# Patient Record
Sex: Male | Born: 1960 | Race: White | Hispanic: No | Marital: Married | Smoking: Never smoker
Health system: Southern US, Community
[De-identification: ages and names within clinical notes are randomized; demographics above are authoritative.]

## PROBLEM LIST (undated history)

## (undated) DIAGNOSIS — K219 Gastro-esophageal reflux disease without esophagitis: Secondary | ICD-10-CM

## (undated) DIAGNOSIS — K921 Melena: Secondary | ICD-10-CM

## (undated) DIAGNOSIS — E669 Obesity, unspecified: Secondary | ICD-10-CM

## (undated) DIAGNOSIS — R0600 Dyspnea, unspecified: Secondary | ICD-10-CM

## (undated) DIAGNOSIS — R200 Anesthesia of skin: Secondary | ICD-10-CM

## (undated) DIAGNOSIS — A419 Sepsis, unspecified organism: Secondary | ICD-10-CM

## (undated) DIAGNOSIS — G4730 Sleep apnea, unspecified: Secondary | ICD-10-CM

## (undated) DIAGNOSIS — H409 Unspecified glaucoma: Secondary | ICD-10-CM

## (undated) DIAGNOSIS — G4733 Obstructive sleep apnea (adult) (pediatric): Secondary | ICD-10-CM

## (undated) DIAGNOSIS — I10 Essential (primary) hypertension: Secondary | ICD-10-CM

## (undated) DIAGNOSIS — Z7982 Long term (current) use of aspirin: Secondary | ICD-10-CM

## (undated) DIAGNOSIS — F319 Bipolar disorder, unspecified: Secondary | ICD-10-CM

## (undated) DIAGNOSIS — T884XXA Failed or difficult intubation, initial encounter: Secondary | ICD-10-CM

## (undated) DIAGNOSIS — M1711 Unilateral primary osteoarthritis, right knee: Secondary | ICD-10-CM

## (undated) DIAGNOSIS — K269 Duodenal ulcer, unspecified as acute or chronic, without hemorrhage or perforation: Secondary | ICD-10-CM

## (undated) DIAGNOSIS — F32A Depression, unspecified: Secondary | ICD-10-CM

## (undated) DIAGNOSIS — K649 Unspecified hemorrhoids: Secondary | ICD-10-CM

## (undated) DIAGNOSIS — IMO0002 Reserved for concepts with insufficient information to code with codable children: Secondary | ICD-10-CM

## (undated) HISTORY — DX: Essential (primary) hypertension: I10

## (undated) HISTORY — DX: Unspecified glaucoma: H40.9

## (undated) HISTORY — DX: Unspecified hemorrhoids: K64.9

## (undated) HISTORY — DX: Melena: K92.1

## (undated) HISTORY — DX: Reserved for concepts with insufficient information to code with codable children: IMO0002

## (undated) HISTORY — PX: APPENDECTOMY: SHX54

## (undated) HISTORY — DX: Obesity, unspecified: E66.9

---

## 1990-07-05 HISTORY — PX: CHOLECYSTECTOMY: SHX55

## 1998-07-05 DIAGNOSIS — I10 Essential (primary) hypertension: Secondary | ICD-10-CM

## 1998-07-05 HISTORY — DX: Essential (primary) hypertension: I10

## 2002-07-05 DIAGNOSIS — H409 Unspecified glaucoma: Secondary | ICD-10-CM

## 2002-07-05 HISTORY — DX: Unspecified glaucoma: H40.9

## 2005-10-13 ENCOUNTER — Ambulatory Visit: Payer: Self-pay | Admitting: *Deleted

## 2005-11-08 ENCOUNTER — Ambulatory Visit: Payer: Self-pay | Admitting: *Deleted

## 2005-12-15 ENCOUNTER — Ambulatory Visit (HOSPITAL_COMMUNITY): Admission: RE | Admit: 2005-12-15 | Discharge: 2005-12-15 | Payer: Self-pay | Admitting: *Deleted

## 2007-02-15 ENCOUNTER — Ambulatory Visit: Payer: Self-pay | Admitting: Internal Medicine

## 2007-03-03 ENCOUNTER — Ambulatory Visit: Payer: Self-pay | Admitting: Internal Medicine

## 2007-06-14 ENCOUNTER — Ambulatory Visit: Payer: Self-pay | Admitting: Specialist

## 2007-12-12 DIAGNOSIS — G4730 Sleep apnea, unspecified: Secondary | ICD-10-CM | POA: Insufficient documentation

## 2007-12-12 DIAGNOSIS — F4323 Adjustment disorder with mixed anxiety and depressed mood: Secondary | ICD-10-CM | POA: Insufficient documentation

## 2007-12-12 DIAGNOSIS — I10 Essential (primary) hypertension: Secondary | ICD-10-CM | POA: Insufficient documentation

## 2008-04-16 ENCOUNTER — Ambulatory Visit: Payer: Self-pay | Admitting: Family Medicine

## 2008-07-05 HISTORY — PX: KNEE SURGERY: SHX244

## 2008-10-22 DIAGNOSIS — J301 Allergic rhinitis due to pollen: Secondary | ICD-10-CM | POA: Insufficient documentation

## 2009-01-07 ENCOUNTER — Ambulatory Visit: Payer: Self-pay | Admitting: Orthopedic Surgery

## 2009-01-20 ENCOUNTER — Ambulatory Visit: Payer: Self-pay | Admitting: Orthopedic Surgery

## 2009-01-28 DIAGNOSIS — E7800 Pure hypercholesterolemia, unspecified: Secondary | ICD-10-CM | POA: Insufficient documentation

## 2009-03-06 ENCOUNTER — Ambulatory Visit: Payer: Self-pay | Admitting: Orthopedic Surgery

## 2009-03-07 ENCOUNTER — Emergency Department: Payer: Self-pay | Admitting: Emergency Medicine

## 2009-07-05 DIAGNOSIS — K259 Gastric ulcer, unspecified as acute or chronic, without hemorrhage or perforation: Secondary | ICD-10-CM

## 2009-07-05 DIAGNOSIS — IMO0002 Reserved for concepts with insufficient information to code with codable children: Secondary | ICD-10-CM

## 2009-07-05 HISTORY — DX: Gastric ulcer, unspecified as acute or chronic, without hemorrhage or perforation: K25.9

## 2009-07-05 HISTORY — DX: Reserved for concepts with insufficient information to code with codable children: IMO0002

## 2009-08-22 DIAGNOSIS — G4700 Insomnia, unspecified: Secondary | ICD-10-CM | POA: Insufficient documentation

## 2010-05-22 ENCOUNTER — Ambulatory Visit: Payer: Self-pay | Admitting: General Surgery

## 2010-05-22 HISTORY — PX: COLONOSCOPY W/ POLYPECTOMY: SHX1380

## 2010-05-22 LAB — HM COLONOSCOPY

## 2010-11-17 NOTE — Assessment & Plan Note (Signed)
[facility]                             PULMONARY OFFICE NOTE   CORDERO, SURETTE                     MRN:          [id number]  DATE:03/03/2007                            DOB:          20-Apr-1961    HISTORY:  This is a 50 year old white male former smoker who comes in  with his wife concerned that he is still coughing after having been seen  on 8/13 with a cough dating back two months. He states that he was  transiently better after receiving a six day course of prednisone, but  never was able to control the cough. On the other hand, he did not  aggressively suppress the cough with Tramadol and the maximum dose that  was recommended. He did start on Aciphex 20 mg daily before meals on his  visit and eliminated Lotrel in favor of Tekturna 300/25 1 daily.   The patient does give the interesting history that he has no cough at  all when he is sleeping, and actually does not cough for the first hour  and a half. He then begins violent coughing and then after it is out of  control reaches for the Tramadol which then helps some. However, he  denies any excess sputum production, fevers, chills, sweats, orthopnea,  PND, or leg swelling.   PHYSICAL EXAMINATION:  GENERAL:  Obese, ambulatory white male in no  acute distress.  VITAL SIGNS:  Stable. Weight 323 pounds, blood pressure 158/108.  HEENT:  Unremarkable, oropharynx clear, nasal turbinates normal.  LUNGS:  Clear bilaterally to auscultation and percussion. He coughed on  inspiration and felt the urge to cough in the midsternal area. He had  excellent air movement though with no crackles on inspiration nor  wheezes on expiration.  CARDIAC:  Regular rate and rhythm with no murmurs, rubs, or gallops.  ABDOMEN:  Soft and benign, but obese.  EXTREMITIES:  Warm without calf tenderness, cyanosis, clubbing, or  edema.   IMPRESSION:  1. 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 To address this address, I recommended revising the cough      regimen that he was given previously to include an increased dose      of Aciphex to b.i.d. dosing, an increase of Tramadol up to every 4      hours until the cough was gone and then to reduce the dose, and a 6      day course of prednisone.  2. Hypertension that is not adequately controlled on his present      regimen. I would like to avoid calcium channel blockers in patients      who I suspect that might have reflux and Minoxidil might be a good      choice here, however, given the refractory nature of his      hypertension which most likely is related to obesity. However, I do      not have samples of Minoxidil, so I gave him [employer]  17 mg 1 daily      for the next 3 weeks and would defer management of the      hypertension to Dr. Elisabeth Cara capable hands, but I would certainly      avoid ACE inhibitors if at all possible.     Charlaine Dalton. Sherene Sires, MD, [facility]  Electronically Signed    MBW/MedQ  DD: 03/03/2007  DT: 03/05/2007  Job #: [id number]   cc:   Julieanne Manson

## 2010-11-17 NOTE — Assessment & Plan Note (Signed)
[facility]                             PULMONARY OFFICE NOTE   Peter Peter [name], Peter Peter [name]                     MRN:          [id number]  DATE:02/15/2007                            DOB:          Jan 25, 1961    REFERRING PHYSICIAN:  Julieanne Manson   PULMONARY CONSULTATION   REASON FOR CONSULTATION:  Cough.   HISTORY:  A 50 year old white male with morbid obesity status post  smoking cessation 17 years ago with no significant chronic lower  respiratory tract problems, but intermittent seasonal rhinitis with new  onset cough approximately 2 months ago that occurred abruptly.  It  occurred while on ACE inhibitor, but did not improve off of ACE  inhibitor for, he thinks, about 4 weeks.  He described it as a dry cough  that worsens during the day, but does not exacerbate nocturnally.  He  does have seasonal nasal allergy symptoms, as above, but never had a  cough from this and says that these are no worse than usual since the  onset of this cough abruptly 2 months ago.  He denies any overt reflux  or sinus symptoms at present, fevers, chills, sweats, or significant  dyspnea, except during coughing paroxysms.   PAST MEDICAL HISTORY:  Significant for hypertension, obesity, and sleep  apnea.  He is status post cholecystectomy in 1993.   ALLERGIES:  None known.   MEDICATIONS:  Include both Diovan and Lotrel, as well as omega 3  vitamins and atenolol.  For full inventory of medications, please see  face sheet dated February 15, 2007.   SOCIAL HISTORY:  He quit smoking 17 years ago.  He works in  Set designer.   FAMILY HISTORY:  Reviewed in detail with the patient and negative for  respiratory diseases and atopy.   REVIEW OF SYSTEMS:  Taken also in detail and on the worksheet, negative  except as outlined above.   PHYSICAL EXAMINATION:  This is Peter [name] obese pleasant ambulatory white male  in no acute distress.  He has stable vital signs, except for a blood  pressure of 150/94, and a  weight of 324 pounds.  HEENT:  Oropharynx reveals limited airway inspection due to size of the  tongue and low soft palate.  I do not see any obvious post-nasal  drainage or cobblestoning of the airway.  Nasal turbinates revealed mild  nonspecific edema.  Ear canals reveal wax bilaterally, but there is no  cough on manipulation of the external canal.  NECK:  Supple without cervical adenopathy or tenderness.  Trachea is  midline without thyromegaly.  LUNGS:  The lung fields are perfectly clear bilaterally to auscultation  and percussion with no cough elicited on inspiratory or expiratory  maneuvers.  CARDIAC:  Regular rate and rhythm without murmur, gallop, or rub.  S1,  S2 were diminished.  ABDOMEN:  Soft and obese, but benign.  Limited excursion in supine  position.  EXTREMITIES:  Warm without calf tenderness, cyanosis, clubbing, or  edema.   CHEST X-RAY:  Reviewed with no comparisons available.  Study done July  17 with the only abnormality being elevated  right hemidiaphragm  anteriorly, consistent with eventration rather than paralysis.   IMPRESSION:  Chronic daytime cough with nonspecific features that  strongly favor reflux as a driving mechanism.  I have found that reflux  serves to fuel the fire, ACE inhibitors tend to fan the fire, and the  cough tends to exacerbate via a cyclical mechanism in this particular  type of individual.  Therefore, to control the cough it is necessary to  eliminating the cough loop (by the combination of Delsym and tramadol 50  mg every 4 hours, take AcipHex 20 mg before meals perfectly regularly  for at least 4 weeks, and to take a 6 day course of prednisone to  control airways inflammation).   At this point I would like him to stop Lotrel and substitute Tekturna  300/25 to supplement the Diovan.  At the end of 4 weeks, I have asked  him to return to Dr. Sullivan Lone if better, and to return to me if not for  additional  workup, which might include a sinus CT scan and methacholine  using the standardized approach to cough published in Chest within the  last year by Dr. Stark Falls.   I also note parenthetically that he is on timolol, which is a  nonspecific beta blocker, which sometimes can cause asthma, and  therefore cough.  However, the absence of cough during sleep is strongly  against this possibility.   In terms of future treatment of hypertension in this patient with morbid  obesity, I will defer, of course, that issue to Dr. Sullivan Lone.  ACE  inhibitors are problematic in patients who tend to cough because they  frequently exacerbate coughing, even if the couh is present for other  reasons.  I note that he is already on maximum doses of Diovan so  additional ARB therapy, is not likely to be beneficial either.  For now,  I have recommended simply the combination of Diovan, Tekturna, and  hydrochlorothiazide.  The Tekturna should be 300/25 one daily.     Charlaine Dalton. Sherene Sires, MD, [facility]  Electronically Signed    MBW/MedQ  DD: 02/15/2007  DT: 02/16/2007  Job #: [id number]   cc:   Julieanne Manson

## 2011-06-21 DIAGNOSIS — F32A Depression, unspecified: Secondary | ICD-10-CM | POA: Insufficient documentation

## 2011-06-21 DIAGNOSIS — F329 Major depressive disorder, single episode, unspecified: Secondary | ICD-10-CM | POA: Insufficient documentation

## 2013-01-12 ENCOUNTER — Encounter: Payer: Self-pay | Admitting: *Deleted

## 2013-01-15 ENCOUNTER — Encounter: Payer: Self-pay | Admitting: *Deleted

## 2013-01-15 DIAGNOSIS — K921 Melena: Secondary | ICD-10-CM | POA: Insufficient documentation

## 2014-12-13 ENCOUNTER — Other Ambulatory Visit: Payer: Self-pay | Admitting: Family Medicine

## 2015-01-08 ENCOUNTER — Other Ambulatory Visit: Payer: Self-pay | Admitting: Family Medicine

## 2015-01-09 ENCOUNTER — Other Ambulatory Visit: Payer: Self-pay

## 2015-01-09 DIAGNOSIS — K219 Gastro-esophageal reflux disease without esophagitis: Secondary | ICD-10-CM

## 2015-01-09 MED ORDER — MAGNESIUM OXIDE 400 (241.3 MG) MG PO TABS: 1.0000 | ORAL_TABLET | Freq: Two times a day (BID) | ORAL | Status: DC

## 2015-01-09 MED ORDER — RANITIDINE HCL 300 MG PO TABS: 300.0000 mg | ORAL_TABLET | Freq: Two times a day (BID) | ORAL | Status: DC

## 2015-02-07 ENCOUNTER — Other Ambulatory Visit: Payer: Self-pay | Admitting: Family Medicine

## 2015-03-25 ENCOUNTER — Ambulatory Visit (INDEPENDENT_AMBULATORY_CARE_PROVIDER_SITE_OTHER): Payer: BLUE CROSS/BLUE SHIELD | Admitting: Family Medicine

## 2015-03-25 ENCOUNTER — Encounter: Payer: Self-pay | Admitting: Family Medicine

## 2015-03-25 VITALS — BP 128/84 | HR 66 | Temp 98.4°F | Resp 14 | Ht 68.0 in | Wt 317.0 lb

## 2015-03-25 DIAGNOSIS — Z125 Encounter for screening for malignant neoplasm of prostate: Secondary | ICD-10-CM | POA: Diagnosis not present

## 2015-03-25 DIAGNOSIS — E669 Obesity, unspecified: Secondary | ICD-10-CM | POA: Diagnosis not present

## 2015-03-25 DIAGNOSIS — Z23 Encounter for immunization: Secondary | ICD-10-CM | POA: Diagnosis not present

## 2015-03-25 DIAGNOSIS — Z1211 Encounter for screening for malignant neoplasm of colon: Secondary | ICD-10-CM

## 2015-03-25 DIAGNOSIS — E559 Vitamin D deficiency, unspecified: Secondary | ICD-10-CM | POA: Insufficient documentation

## 2015-03-25 DIAGNOSIS — Z0000 Encounter for general adult medical examination without abnormal findings: Secondary | ICD-10-CM

## 2015-03-25 DIAGNOSIS — E291 Testicular hypofunction: Secondary | ICD-10-CM | POA: Insufficient documentation

## 2015-03-25 LAB — POCT URINALYSIS DIPSTICK
Bilirubin, UA: NEGATIVE
Blood, UA: NEGATIVE
Glucose, UA: NEGATIVE
Ketones, UA: NEGATIVE
Leukocytes, UA: NEGATIVE
Nitrite, UA: NEGATIVE
Protein, UA: NEGATIVE
Spec Grav, UA: 1.02
Urobilinogen, UA: 0.2
pH, UA: 7

## 2015-03-25 LAB — IFOBT (OCCULT BLOOD): IMMUNOLOGICAL FECAL OCCULT BLOOD TEST: POSITIVE

## 2015-03-25 NOTE — Progress Notes (Signed)
Patient ID: BLONG BUSK, male   DOB: 1961/04/15, 54 y.o.   MRN: [id number]  Visit Date: 03/25/2015  Today's Provider: [name], MD   Chief Complaint  Patient presents with  . Annual Exam   Subjective:  Peter [name] is a 54 y.o. male who presents today for health maintenance and complete physical. He feels well. He reports exercising none. He reports he is sleeping well. LAST  Colonoscopy 05/22/10-pathology was not received.  EKG 03/09/12  Tdap 04/06/11  Review of Systems  Constitutional: Negative.   HENT: Positive for sinus pressure.   Eyes: Negative.   Respiratory: Negative.   Cardiovascular: Negative.   Gastrointestinal: Negative.   Endocrine: Negative.   Genitourinary: Negative.   Musculoskeletal: Positive for arthralgias.  Skin: Negative.   Allergic/Immunologic: Negative.   Neurological: Positive for headaches.  Hematological: Negative.   Psychiatric/Behavioral: Negative.     Social History   Social History  . Marital Status: Married    Spouse Name: N/A  . Number of Children: N/A  . Years of Education: N/A   Occupational History  . Not on file.   Social History Main Topics  . Smoking status: Never Smoker   . Smokeless tobacco: Never Used  . Alcohol Use: Yes     Comment: occasionally  . Drug Use: No  . Sexual Activity: Not on file   Other Topics Concern  . Not on file   Social History Narrative    Patient Active Problem List   Diagnosis Date Noted  . 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 Avitaminosis D 03/25/2015  . Blood in stool   . Clinical depression 06/21/2011  . Cannot sleep 08/22/2009  . Hypercholesterolemia without hypertriglyceridemia 01/28/2009  . Hay fever 10/22/2008  . Adjustment disorder with mixed anxiety and depressed mood 12/12/2007  . Essential (primary) hypertension 12/12/2007  . Apnea, sleep 12/12/2007    Past Surgical History  Procedure Laterality Date  . Knee surgery Left 2010  . Cholecystectomy  1992  .  Colonoscopy w/ polypectomy  05/22/2010    57mmtransverse colon polyp, traditional serrated adenoma,negative for high grade dysplasia & malignancy. rectal polyp-80mm,negative for dysplasia & malignancy.    His family history includes Cancer in his other; Hyperlipidemia in his mother; Hypertension in his mother.    Outpatient Prescriptions Prior to Visit  Medication Sig Dispense Refill  . BYSTOLIC 10 MG tablet TAKE 1 TABLET BY MOUTH EVERY DAY 30 tablet 5  . magnesium oxide (MAGNESIUM-OXIDE) 400 (241.3 MG) MG tablet Take 1 tablet (400 mg total) by mouth 2 (two) times daily. 60 tablet 12  . ranitidine (ZANTAC) 300 MG tablet Take 1 tablet (300 mg total) by mouth 2 (two) times daily. 60 tablet 12   No facility-administered medications prior to visit.    Patient Care Team: Jerrol Banana., MD as PCP - General (Family Medicine)     Objective:   Vitals:  Filed Vitals:   03/25/15 0846  BP: 128/84  Pulse: 66  Temp: 98.4 F (36.9 C)  Resp: 14  Height: 5\' 8"  (1.727 m)  Weight: 317 lb (143.79 kg)    Physical Exam  Constitutional: He is oriented to person, place, and time. He appears well-developed and well-nourished.  Morbidly obese white male in no acute distress  HENT:  Head: Normocephalic and atraumatic.  Right Ear: External ear normal.  Left Ear: External ear normal.  Nose: Nose normal.  Mouth/Throat: Oropharynx is clear and moist.  Eyes: Conjunctivae are normal. Pupils are equal, round,  and reactive to light.  Neck: Neck supple.  Cardiovascular: Normal rate, regular rhythm, normal heart sounds and intact distal pulses.   Pulmonary/Chest: Effort normal and breath sounds normal.  Abdominal: Soft.  Genitourinary: Rectum normal, prostate normal and penis normal.  Musculoskeletal: Normal range of motion.  Neurological: He is alert and oriented to person, place, and time.  Skin: Skin is warm and dry.  Psychiatric: He has a normal mood and affect. His behavior is normal.  Judgment and thought content normal.     Depression Screen PHQ 2/9 Scores 03/25/2015  PHQ - 2 Score 0      Assessment & Plan:    1. Need for influenza vaccination  - Flu Vaccine QUAD 36+ mos IM  2. Prostate cancer screening  - PSA  3. Colon cancer screening  - IFOBT POC (occult bld, rslt in office)OC positive--refer to Dr. Jennette Banker from GI  4. Annual physical exam  - CBC with Differential/Platelet - Lipid Panel With LDL/HDL Ratio - TSH - Comprehensive metabolic panel - POCT urinalysis dipstick  5. Obesity BMI of 48 with obstructive sleep apnea and hypertension - Ambulatory referral to General Surgery for consideration of bariatric surgery.  6. Atypical nevi of trunk Refer to dermatology    I have done the exam and reviewed the above chart and it is accurate to the best of my knowledge.

## 2015-03-26 LAB — CBC WITH DIFFERENTIAL/PLATELET
BASOS ABS: 0 10*3/uL (ref 0.0–0.2)
Basos: 0 %
EOS (ABSOLUTE): 0.1 10*3/uL (ref 0.0–0.4)
EOS: 2 %
HEMATOCRIT: 45.3 % (ref 37.5–51.0)
HEMOGLOBIN: 15.5 g/dL (ref 12.6–17.7)
IMMATURE GRANS (ABS): 0 10*3/uL (ref 0.0–0.1)
Immature Granulocytes: 0 %
LYMPHS ABS: 1.9 10*3/uL (ref 0.7–3.1)
LYMPHS: 36 %
MCH: 30 pg (ref 26.6–33.0)
MCHC: 34.2 g/dL (ref 31.5–35.7)
MCV: 88 fL (ref 79–97)
MONOCYTES: 10 %
Monocytes Absolute: 0.6 10*3/uL (ref 0.1–0.9)
NEUTROS ABS: 2.8 10*3/uL (ref 1.4–7.0)
Neutrophils: 52 %
Platelets: 217 10*3/uL (ref 150–379)
RBC: 5.16 x10E6/uL (ref 4.14–5.80)
RDW: 13.4 % (ref 12.3–15.4)
WBC: 5.4 10*3/uL (ref 3.4–10.8)

## 2015-03-26 LAB — LIPID PANEL WITH LDL/HDL RATIO
CHOLESTEROL TOTAL: 251 mg/dL — AB (ref 100–199)
HDL: 79 mg/dL (ref >39)
LDL CALC: 144 mg/dL — AB (ref 0–99)
LDL/HDL RATIO: 1.8 ratio (ref 0.0–3.6)
TRIGLYCERIDES: 138 mg/dL (ref 0–149)
VLDL CHOLESTEROL CAL: 28 mg/dL (ref 5–40)

## 2015-03-26 LAB — COMPREHENSIVE METABOLIC PANEL
A/G RATIO: 1.3 (ref 1.1–2.5)
ALK PHOS: 123 IU/L — AB (ref 39–117)
ALT: 15 IU/L (ref 0–44)
AST: 22 IU/L (ref 0–40)
Albumin: 4 g/dL (ref 3.5–5.5)
BILIRUBIN TOTAL: 0.4 mg/dL (ref 0.0–1.2)
BUN / CREAT RATIO: 22 — AB (ref 9–20)
BUN: 17 mg/dL (ref 6–24)
CHLORIDE: 101 mmol/L (ref 97–108)
CO2: 28 mmol/L (ref 18–29)
Calcium: 9.3 mg/dL (ref 8.7–10.2)
Creatinine, Ser: 0.78 mg/dL (ref 0.76–1.27)
GFR calc non Af Amer: 101 mL/min/{1.73_m2} (ref >59)
GFR, EST AFRICAN AMERICAN: 117 mL/min/{1.73_m2} (ref >59)
Globulin, Total: 3.2 g/dL (ref 1.5–4.5)
Glucose: 91 mg/dL (ref 65–99)
POTASSIUM: 3.6 mmol/L (ref 3.5–5.2)
SODIUM: 144 mmol/L (ref 134–144)
TOTAL PROTEIN: 7.2 g/dL (ref 6.0–8.5)

## 2015-03-26 LAB — TSH: TSH: 3.55 u[IU]/mL (ref 0.450–4.500)

## 2015-03-26 LAB — PSA: PROSTATE SPECIFIC AG, SERUM: 1.5 ng/mL (ref 0.0–4.0)

## 2015-04-10 ENCOUNTER — Other Ambulatory Visit: Payer: Self-pay | Admitting: Family Medicine

## 2015-05-09 ENCOUNTER — Other Ambulatory Visit: Payer: Self-pay | Admitting: Family Medicine

## 2015-06-16 ENCOUNTER — Ambulatory Visit: Payer: Self-pay | Admitting: General Surgery

## 2015-07-31 ENCOUNTER — Encounter: Payer: Self-pay | Admitting: *Deleted

## 2015-08-09 ENCOUNTER — Other Ambulatory Visit: Payer: Self-pay | Admitting: Family Medicine

## 2015-08-19 ENCOUNTER — Ambulatory Visit
Admission: RE | Admit: 2015-08-19 | Discharge: 2015-08-19 | Disposition: A | Discharge status: Home / Self Care | Payer: BLUE CROSS/BLUE SHIELD | Source: Ambulatory Visit | Attending: Family Medicine | Admitting: Family Medicine

## 2015-08-19 ENCOUNTER — Ambulatory Visit (INDEPENDENT_AMBULATORY_CARE_PROVIDER_SITE_OTHER): Payer: BLUE CROSS/BLUE SHIELD | Admitting: Family Medicine

## 2015-08-19 VITALS — BP 148/88 | HR 70 | Temp 98.3°F | Resp 16 | Wt 310.0 lb

## 2015-08-19 DIAGNOSIS — R059 Cough, unspecified: Secondary | ICD-10-CM

## 2015-08-19 DIAGNOSIS — R6889 Other general symptoms and signs: Secondary | ICD-10-CM | POA: Diagnosis not present

## 2015-08-19 DIAGNOSIS — J42 Unspecified chronic bronchitis: Secondary | ICD-10-CM

## 2015-08-19 DIAGNOSIS — R05 Cough: Secondary | ICD-10-CM

## 2015-08-19 LAB — POCT INFLUENZA A/B
Influenza A, POC: NEGATIVE
Influenza B, POC: NEGATIVE

## 2015-08-19 MED ORDER — AMOXICILLIN-POT CLAVULANATE 875-125 MG PO TABS: 1.0000 | ORAL_TABLET | Freq: Two times a day (BID) | ORAL | Status: DC

## 2015-08-19 NOTE — Progress Notes (Signed)
Patient ID: Peter [name], male   DOB: October 10, 1960, 55 y.o.   MRN: [id number]   CASHON MITRI  MRN: [id number] DOB: June 28, 1961  Subjective:  HPI   1. Flu-like symptoms The patient is a 55 year old male who presents for evaluation of flu like symptoms.  He states they began about 1 week ago with scratchy throat and then turned into cough with occasionally being productive of brownish sputum.  No fever that he notes.  He does complain of body aches and head congestion.  He further states that several people at work are out with similar symptoms, but he is unsure if any have tested positive for flu.  It is of note that the patient has had his flu shot for this year.  Patient Active Problem List   Diagnosis Date Noted  . 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 Avitaminosis D 03/25/2015  . Blood in stool   . Clinical depression 06/21/2011  . Cannot sleep 08/22/2009  . Hypercholesterolemia without hypertriglyceridemia 01/28/2009  . Hay fever 10/22/2008  . Adjustment disorder with mixed anxiety and depressed mood 12/12/2007  . Essential (primary) hypertension 12/12/2007  . Apnea, sleep 12/12/2007    Past Medical History  Diagnosis Date  . Unspecified hemorrhoids without mention of complication   . Glaucoma     since 2004  . Hypertension     since 2000  . Ulcer 2011    gastric  . Blood in stool   . Obesity, unspecified     Social History   Social History  . Marital Status: Married    Spouse Name: N/A  . Number of Children: N/A  . Years of Education: N/A   Occupational History  . Not on file.   Social History Main Topics  . Smoking status: Never Smoker   . Smokeless tobacco: Never Used  . Alcohol Use: Yes     Comment: occasionally  . Drug Use: No  . Sexual Activity: Not on file   Other Topics Concern  . Not on file   Social History Narrative    Outpatient Prescriptions Prior to Visit  Medication Sig Dispense Refill  . amLODipine (NORVASC) 10 MG tablet TAKE 1  TABLET BY MOUTH EVERY DAY 30 tablet 12  . ascorbic acid (C 500) 500 MG tablet C 500, 500MG  (Oral Tablet)  1 every day for 0 days  Quantity: 0.00;  Refills: 0   Ordered :27-Feb-2010  Ashley Royalty ;  Started 07-Aug-2009 Active    . BYSTOLIC 10 MG tablet TAKE 1 TABLET BY MOUTH EVERY DAY 30 tablet 0  . clobetasol ointment (TEMOVATE) 0.05 %     . EQUETRO 200 MG CP12 12 hr capsule   7  . Garlic 123XX123 MG TABS Take 1 tablet by mouth 2 (two) times daily.    . hydrochlorothiazide (HYDRODIURIL) 25 MG tablet TAKE 1 TABLET BY MOUTH EVERY DAY 30 tablet 12  . lamoTRIgine (LAMICTAL) 200 MG tablet Take by mouth.    . losartan (COZAAR) 100 MG tablet TAKE 1 TABLET BY MOUTH EVERY DAY 30 tablet 12  . Magnesium Oxide 400 MG CAPS Take by mouth.    . OMEGA-3 FATTY ACIDS PO     . potassium chloride (MICRO-K) 10 MEQ CR capsule Take by mouth.    . ranitidine (ZANTAC) 300 MG tablet Take 1 tablet (300 mg total) by mouth 2 (two) times daily. 60 tablet 12  . RUTIN, MISC. NUTRITIONAL FACTORS, RUTIN, 500MG  (Oral Tablet)  1 every day for 0  days  Quantity: 0.00;  Refills: 0   Ordered :27-Feb-2010  Ashley Royalty ;  Started 07-Aug-2009 Active    . sertraline (ZOLOFT) 100 MG tablet TAKE 1 TABLET BY MOUTH TWICE DAILY 60 tablet 12  . magnesium oxide (MAGNESIUM-OXIDE) 400 (241.3 MG) MG tablet Take 1 tablet (400 mg total) by mouth 2 (two) times daily. 60 tablet 12   No facility-administered medications prior to visit.    No Known Allergies  Review of Systems  Constitutional: Positive for malaise/fatigue. Negative for fever, chills and diaphoresis.  HENT: Positive for congestion. Negative for ear discharge, ear pain, hearing loss, nosebleeds, sore throat and tinnitus.   Eyes: Negative for blurred vision, double vision, photophobia, pain, discharge and redness.  Respiratory: Positive for cough, sputum production and shortness of breath (mainly from coughing so much). Negative for hemoptysis and wheezing.        Rib pain  secondary to cough.  Cardiovascular: Negative for chest pain, palpitations, orthopnea and leg swelling.  Neurological: Positive for weakness. Negative for headaches.   Objective:  BP 148/88 mmHg  Pulse 70  Temp(Src) 98.3 F (36.8 C) (Oral)  Resp 16  Wt 310 lb (140.615 kg)  SpO2 95%  Physical Exam  Constitutional: He is oriented to person, place, and time and well-developed, well-nourished, and in no distress.  HENT:  Head: Normocephalic and atraumatic.  Right Ear: External ear normal.  Left Ear: External ear normal.  Nose: Nose normal.  Mouth/Throat: Oropharynx is clear and moist.  Eyes: Conjunctivae and EOM are normal. Pupils are equal, round, and reactive to light.  Neck: Normal range of motion. Neck supple.  Cardiovascular: Normal rate, regular rhythm, normal heart sounds and intact distal pulses.   Pulmonary/Chest: Effort normal and breath sounds normal.  Neurological: He is alert and oriented to person, place, and time. Gait normal.  Skin: Skin is warm and dry.  Psychiatric: Mood, memory, affect and judgment normal.    Assessment and Plan :   1. Flu-like symptoms  - POCT Influenza A/B  2. Chronic bronchitis, unspecified chronic bronchitis type ([place])  - DG Chest 2 View; Future - amoxicillin-clavulanate (AUGMENTIN) 875-125 MG tablet; Take 1 tablet by mouth 2 (two) times daily.  Dispense: 20 tablet; Refill: 0  3. Cough  - DG Chest 2 View; Future - amoxicillin-clavulanate (AUGMENTIN) 875-125 MG tablet; Take 1 tablet by mouth 2 (two) times daily.  Dispense: 20 tablet; Refill: 0 4.Obesity/OSA  I have done the exam and reviewed the above chart and it is accurate to the best of my knowledge.   Miguel Aschoff MD [facility] 08/19/2015 9:34 AM

## 2015-09-07 ENCOUNTER — Other Ambulatory Visit: Payer: Self-pay | Admitting: Family Medicine

## 2015-09-22 ENCOUNTER — Ambulatory Visit: Payer: BLUE CROSS/BLUE SHIELD | Admitting: Family Medicine

## 2015-10-09 DIAGNOSIS — S62620A Displaced fracture of medial phalanx of right index finger, initial encounter for closed fracture: Secondary | ICD-10-CM | POA: Diagnosis not present

## 2015-10-09 DIAGNOSIS — S20212A Contusion of left front wall of thorax, initial encounter: Secondary | ICD-10-CM | POA: Diagnosis not present

## 2015-10-10 ENCOUNTER — Other Ambulatory Visit: Payer: Self-pay | Admitting: Family Medicine

## 2015-10-13 DIAGNOSIS — F3175 Bipolar disorder, in partial remission, most recent episode depressed: Secondary | ICD-10-CM | POA: Diagnosis not present

## 2015-10-13 LAB — HEPATIC FUNCTION PANEL
ALK PHOS: 130 U/L — AB (ref 25–125)
ALT: 18 U/L (ref 10–40)
AST: 20 U/L (ref 14–40)
Bilirubin, Total: 0.4 mg/dL

## 2015-10-13 LAB — CBC AND DIFFERENTIAL
HEMATOCRIT: 45 % (ref 41–53)
Hemoglobin: 15.6 g/dL (ref 13.5–17.5)
NEUTROS ABS: 4 /uL
PLATELETS: 228 10*3/uL (ref 150–399)
WBC: 6.6 10^3/mL

## 2015-10-20 ENCOUNTER — Encounter: Payer: Self-pay | Admitting: Family Medicine

## 2015-10-24 DIAGNOSIS — F3175 Bipolar disorder, in partial remission, most recent episode depressed: Secondary | ICD-10-CM | POA: Diagnosis not present

## 2015-12-23 ENCOUNTER — Encounter: Payer: Self-pay | Admitting: Family Medicine

## 2015-12-23 ENCOUNTER — Ambulatory Visit (INDEPENDENT_AMBULATORY_CARE_PROVIDER_SITE_OTHER): Payer: BLUE CROSS/BLUE SHIELD | Admitting: Family Medicine

## 2015-12-23 VITALS — BP 140/78 | HR 72 | Temp 99.3°F | Resp 16 | Ht 68.0 in | Wt 314.0 lb

## 2015-12-23 DIAGNOSIS — J0100 Acute maxillary sinusitis, unspecified: Secondary | ICD-10-CM

## 2015-12-23 MED ORDER — FLUTICASONE PROPIONATE 50 MCG/ACT NA SUSP: 2.0000 | Freq: Every day | NASAL | Status: DC

## 2015-12-23 MED ORDER — AMOXICILLIN 875 MG PO TABS: 875.0000 mg | ORAL_TABLET | Freq: Two times a day (BID) | ORAL | Status: DC

## 2015-12-23 NOTE — Patient Instructions (Signed)

## 2015-12-23 NOTE — Progress Notes (Signed)
Patient ID: Peter [name], male   DOB: [birth year], 55 y.o.   MRN: [id number]       Patient: Peter [name] Male    DOB: 1961/01/03   55 y.o.   MRN: [id number] Visit Date: 12/23/2015  Today's Provider: Vernie Murders, PA   Chief Complaint  Patient presents with  . URI    X 4 days.    Subjective:    URI  This is a new problem. The current episode started in the past 7 days. The problem has been gradually worsening. There has been no fever. Associated symptoms include congestion, headaches and sinus pain. Pertinent negatives include no coughing or plugged ear sensation.   Patient reports that he has head congestion over the last 4 days. He reports that he has been taking Advil cold and sinus with no relief. Had sore throat at onset with cough. More head pressure and stuffiness with rhinorrhea. No sneezing or documented fever but felt some chills.    Past Medical History  Diagnosis Date  . Unspecified hemorrhoids without mention of complication   . Glaucoma     since 2004  . Hypertension     since 2000  . Ulcer 2011    gastric  . Blood in stool   . Obesity, unspecified    Past Surgical History  Procedure Laterality Date  . Knee surgery Left 2010  . Cholecystectomy  1992  . Colonoscopy w/ polypectomy  05/22/2010    76mmtransverse colon polyp, traditional serrated adenoma,negative for high grade dysplasia & malignancy. rectal polyp-45mm,negative for dysplasia & malignancy.   Family History  Problem Relation Age of Onset  . Cancer Other     colon  . Hyperlipidemia Mother   . Hypertension Mother    No Known Allergies Current Meds  Medication Sig  . amLODipine (NORVASC) 10 MG tablet TAKE 1 TABLET BY MOUTH EVERY DAY  . ascorbic acid (C 500) 500 MG tablet C 500, 500MG  (Oral Tablet)  1 every day for 0 days  Quantity: 0.00;  Refills: 0   Ordered :27-Feb-2010  Ashley Royalty ;  Started 07-Aug-2009 Active  . BYSTOLIC 10 MG tablet TAKE 1 TABLET BY MOUTH EVERY DAY  .  clobetasol ointment (TEMOVATE) 0.05 %   . EQUETRO 200 MG CP12 12 hr capsule   . Garlic 123XX123 MG TABS Take 1 tablet by mouth 2 (two) times daily.  . hydrochlorothiazide (HYDRODIURIL) 25 MG tablet TAKE 1 TABLET BY MOUTH EVERY DAY  . lamoTRIgine (LAMICTAL) 200 MG tablet Take by mouth.  . losartan (COZAAR) 100 MG tablet TAKE 1 TABLET BY MOUTH EVERY DAY  . Magnesium Oxide 400 MG CAPS Take by mouth.  . OMEGA-3 FATTY ACIDS PO   . potassium chloride (MICRO-K) 10 MEQ CR capsule TAKE 4 CAPSULES BY MOUTH EVERY DAY  . ranitidine (ZANTAC) 300 MG tablet Take 1 tablet (300 mg total) by mouth 2 (two) times daily.  Marland [employer] RUTIN, MISC. NUTRITIONAL FACTORS, RUTIN, 500MG  (Oral Tablet)  1 every day for 0 days  Quantity: 0.00;  Refills: 0   Ordered :27-Feb-2010  Ashley Royalty ;  Started 07-Aug-2009 Active  . sertraline (ZOLOFT) 100 MG tablet TAKE 1 TABLET BY MOUTH TWICE DAILY    Review of Systems  HENT: Positive for congestion.   Respiratory: Negative for cough.   Neurological: Positive for headaches.    Social History  Substance Use Topics  . Smoking status: Never Smoker   . Smokeless tobacco: Never Used  . Alcohol Use: Yes  Comment: occasionally   Objective:   BP 140/78 mmHg  Pulse 72  Temp(Src) 99.3 F (37.4 C)  Resp 16  Ht 5\' 8"  (1.727 m)  Wt 314 lb (142.429 kg)  BMI 47.75 kg/m2  SpO2 95%  Physical Exam  Constitutional: He is oriented to person, place, and time. He appears well-developed and well-nourished. No distress.  HENT:  Head: Normocephalic and atraumatic.  Right Ear: Hearing and external ear normal.  Left Ear: Hearing and external ear normal.  Nose: Nose normal.  Slightly cobblestone appearance to posterior pharynx. Tender maxillary sinuses with no transillumination. Nasal membranes pink and slightly swollen.  Eyes: Conjunctivae and lids are normal. Right eye exhibits no discharge. Left eye exhibits no discharge. No scleral icterus.  Neck: Neck supple.  Cardiovascular: Normal  rate and regular rhythm.   Pulmonary/Chest: Effort normal and breath sounds normal. No respiratory distress.  Musculoskeletal: Normal range of motion.  Lymphadenopathy:    He has no cervical adenopathy.  Neurological: He is alert and oriented to person, place, and time.  Skin: Skin is intact. No lesion and no rash noted.  Psychiatric: He has a normal mood and affect. His speech is normal and behavior is normal. Thought content normal.      Assessment & Plan:     1. Acute maxillary sinusitis, recurrence not specified Onset with PND and sore throat with sinus pressure over the past week. Will treat with antibiotic, steroid nasal spray and may continue Advil Cold & Sinus. Increase fluid intake and recheck if no better in 7-10 days. - amoxicillin (AMOXIL) 875 MG tablet; Take 1 tablet (875 mg total) by mouth 2 (two) times daily.  Dispense: 20 tablet; Refill: 0 - fluticasone (FLONASE) 50 MCG/ACT nasal spray; Place 2 sprays into both nostrils daily.  Dispense: 16 g; Refill: [place], PA  [facility]

## 2016-01-11 ENCOUNTER — Other Ambulatory Visit: Payer: Self-pay | Admitting: Family Medicine

## 2016-04-18 ENCOUNTER — Encounter: Payer: Self-pay | Admitting: Emergency Medicine

## 2016-04-18 ENCOUNTER — Emergency Department
Admission: EM | Admit: 2016-04-18 | Discharge: 2016-04-18 | Disposition: A | Discharge status: Home / Self Care | Payer: Managed Care, Other (non HMO) | Attending: Emergency Medicine | Admitting: Emergency Medicine

## 2016-04-18 DIAGNOSIS — S51812A Laceration without foreign body of left forearm, initial encounter: Secondary | ICD-10-CM | POA: Diagnosis not present

## 2016-04-18 DIAGNOSIS — Y999 Unspecified external cause status: Secondary | ICD-10-CM | POA: Insufficient documentation

## 2016-04-18 DIAGNOSIS — S61213A Laceration without foreign body of left middle finger without damage to nail, initial encounter: Secondary | ICD-10-CM | POA: Insufficient documentation

## 2016-04-18 DIAGNOSIS — W01118A Fall on same level from slipping, tripping and stumbling with subsequent striking against other sharp object, initial encounter: Secondary | ICD-10-CM | POA: Diagnosis not present

## 2016-04-18 DIAGNOSIS — I10 Essential (primary) hypertension: Secondary | ICD-10-CM | POA: Diagnosis not present

## 2016-04-18 DIAGNOSIS — Z79899 Other long term (current) drug therapy: Secondary | ICD-10-CM | POA: Diagnosis not present

## 2016-04-18 DIAGNOSIS — Y92009 Unspecified place in unspecified non-institutional (private) residence as the place of occurrence of the external cause: Secondary | ICD-10-CM | POA: Diagnosis not present

## 2016-04-18 DIAGNOSIS — Z23 Encounter for immunization: Secondary | ICD-10-CM | POA: Insufficient documentation

## 2016-04-18 DIAGNOSIS — Y9389 Activity, other specified: Secondary | ICD-10-CM | POA: Insufficient documentation

## 2016-04-18 DIAGNOSIS — W19XXXA Unspecified fall, initial encounter: Secondary | ICD-10-CM

## 2016-04-18 MED ORDER — BACITRACIN-NEOMYCIN-POLYMYXIN 400-5-5000 EX OINT
TOPICAL_OINTMENT | Freq: Once | CUTANEOUS | Status: AC
  Administered 2016-04-18: 1 via TOPICAL
  Filled 2016-04-18: qty 1

## 2016-04-18 MED ORDER — TETANUS-DIPHTH-ACELL PERTUSSIS 5-2.5-18.5 LF-MCG/0.5 IM SUSP
0.5000 mL | Freq: Once | INTRAMUSCULAR | Status: AC
  Administered 2016-04-18: 0.5 mL via INTRAMUSCULAR
  Filled 2016-04-18: qty 0.5

## 2016-04-18 MED ORDER — LIDOCAINE-EPINEPHRINE 1 %-1:200000 IJ SOLN
INTRAMUSCULAR | Status: AC
Start: 2016-04-18 — End: 2016-04-18
  Administered 2016-04-18: 30 mL
  Filled 2016-04-18: qty 30

## 2016-04-18 MED ORDER — LIDOCAINE-EPINEPHRINE 1 %-1:100000 IJ SOLN
30.0000 mL | Freq: Once | INTRAMUSCULAR | Status: DC
  Filled 2016-04-18: qty 30

## 2016-04-18 NOTE — ED Provider Notes (Signed)
[facility] Emergency Department Provider Note  ____________________________________________  Time seen: Approximately 7:58 PM  I have reviewed the triage vital signs and the nursing notes.   HISTORY  Chief Complaint Laceration    HPI Peter [name] is a 55 y.o. male right-handed male presenting with left forearm and left middle finger laceration after falling. The patient reports that this afternoon he tripped over his computer bag, and fell down onto his left arm. He is not anticoagulated. He did not have any loss of consciousness and denies any neck or back pain. He has no numbness or tingling or limitations in movement.   Past Medical History:  Diagnosis Date  . Blood in stool   . Glaucoma    since 2004  . Hypertension    since 2000  . Obesity, unspecified   . Ulcer ([place]) 2011   gastric  . Unspecified hemorrhoids without mention of complication     Patient Active Problem List   Diagnosis Date Noted  . 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 Avitaminosis D 03/25/2015  . Blood in stool   . Clinical depression 06/21/2011  . Cannot sleep 08/22/2009  . Hypercholesterolemia without hypertriglyceridemia 01/28/2009  . Hay fever 10/22/2008  . Adjustment disorder with mixed anxiety and depressed mood 12/12/2007  . Essential (primary) hypertension 12/12/2007  . Apnea, sleep 12/12/2007    Past Surgical History:  Procedure Laterality Date  . CHOLECYSTECTOMY  1992  . COLONOSCOPY W/ POLYPECTOMY  05/22/2010   68mmtransverse colon polyp, traditional serrated adenoma,negative for high grade dysplasia & malignancy. rectal polyp-70mm,negative for dysplasia & malignancy.  Marland Kitchen KNEE SURGERY Left 2010    Current Outpatient Rx  . Order #: [id number] Class: Normal  . Order #: [id number] Class: Historical Med  . Order #: [id number] Class: Normal  . Order #: [id number] Class: Historical Med  . Order #: [id number] Class: Historical Med  . Order #: [id number] Class:  Historical Med  . Order #: [id number] Class: Normal  . Order #: [id number] Class: Normal  . Order #: [id number] Class: Normal  . Order #: [id number] Class: Historical Med  . Order #: [id number] Class: Normal  . Order #: [id number] Class: Normal  . Order #: [id number] Class: Historical Med  . Order #: [id number] Class: Normal  . Order #: [id number] Class: Normal  . Order #: [id number] Class: Normal  . Order #: [id number] Class: Historical Med  . Order #: [id number] Class: Historical Med    Allergies Review of patient's allergies indicates no known allergies.  Family History  Problem Relation Age of Onset  . Cancer Other     colon  . Hyperlipidemia Mother   . Hypertension Mother     Social History Social History  Substance Use Topics  . Smoking status: Never Smoker  . Smokeless tobacco: Never Used  . Alcohol use Yes     Comment: occasionally    Review of Systems Constitutional: No fever/chills.No lightheadedness or syncope. Positive fall. No loss of consciousness. Eyes: No visual changes. No blurred or double vision. ENT:  No congestion or rhinorrhea. Cardiovascular: Denies chest pain. Denies palpitations. Respiratory: Denies shortness of breath.  No cough. Gastrointestinal: No nausea, no vomiting.  No diarrhea.  No constipation. Musculoskeletal: Negative for back pain. Negative for neck pain. Negative for pain in the left wrist or elbow. Skin: Negative for rash. Positive for 2 lacerations. Neurological: Negative for headaches. No focal numbness, tingling or weakness. No difficulty walking.  10-point ROS otherwise negative.  ____________________________________________   PHYSICAL EXAM:  VITAL SIGNS: ED Triage Vitals  Enc Vitals Group     BP 04/18/16  1900 (!) 177/92     Pulse Rate 04/18/16 1900 77     Resp 04/18/16 1900 18     Temp 04/18/16 1900 98.4 F (36.9 C)     Temp Source 04/18/16 1900 Oral     SpO2 04/18/16 1900 94 %     Weight 04/18/16 1900 (!) 320 lb (145.2 kg)      Height 04/18/16 1900 5\' 6"  (1.676 m)     Head Circumference --      Peak Flow --      Pain Score 04/18/16 1916 1     Pain Loc --      Pain Edu? --      Excl. in [place]? --     Constitutional: Alert and oriented. Well appearing and in no acute distress. Answers questions appropriately.GCS is 15. Eyes: Conjunctivae are normal.  EOMI. No scleral icterus. Head: Atraumatic. No raccoon eyes or Battle sign. Nose: No congestion/rhinnorhea. No swelling over the nose or septal hematoma. Mouth/Throat: Mucous membranes are moist. No dental injury or malocclusion. Neck: No stridor.  Supple.  No midline C-spine tenderness to palpation, step-offs or deformities. Cardiovascular: Normal rate Respiratory: Normal respiratory effort.  Musculoskeletal: No midline T or L-spine tenderness to palpation, step-offs or deformities. Full range of motion of the left wrist, elbow, and shoulder without pain. Normal left radial pulse. Sensation to light touch throughout the left upper extremity.  Neurologic:  A&Ox3.  Speech is clear.  Face and smile are symmetric.  EOMI.  Moves all extremities well. Gait without ataxia. Skin:  Skin is warm, dryt. No rash noted. 7 inch laceration in the mid left forearm with approximately 3.5 inches that is superficial and 3.5 inches which is slightly deeper with some fat protrusion. Left middle finger has a 1 cm laceration near the tip of the fat pad without involvement of the nail. Psychiatric: Mood and affect are normal. Speech and behavior are normal.  Normal judgement.  ____________________________________________   LABS (all labs ordered are listed, but only abnormal results are displayed)  Labs Reviewed - No data to display ____________________________________________  EKG  Not indicated ____________________________________________  RADIOLOGY  No results found.  ____________________________________________   PROCEDURES  Procedure(s) performed:  None  Procedures  Critical Care performed: No ____________________________________________   INITIAL IMPRESSION / ASSESSMENT AND PLAN / ED COURSE  Pertinent labs & imaging results that were available during my care of the patient were reviewed by me and considered in my medical decision making (see chart for details).  55 y.o. male after a mechanical fall with isolated injuries of 2 lacerations. There is no evidence of a syncopal episode, nor of any severe osseous or intracranial, nor spine injury. Plan laceration repair and discharge home.  LACERATION REPAIR Performed by: Eula Listen Authorized by: Eula Listen Consent: Verbal consent obtained. Risks and benefits: risks, benefits and alternatives were discussed Consent given by: patient Patient identity confirmed: provided demographic data Prepped and Draped in normal sterile fashion Wound explored  Laceration Location: left forearm   Laceration Length: 7 inches  No Foreign Bodies seen or palpated  Anesthesia: local infiltration  Local anesthetic: lidocaine 1% with epinephrine  Anesthetic total: 4 ml  Irrigation method: syringe Amount of cleaning: standard  Skin closure: 4-0 Prolene  Number of sutures: 7  Technique: simple interrupted  Patient tolerance: Patient tolerated the procedure well with no immediate complications.   LACERATION REPAIR Performed by: Eula Listen Authorized by: Eula Listen Consent: Verbal consent obtained. Risks and benefits: risks, benefits  and alternatives were discussed Consent given by: patient Patient identity confirmed: provided demographic data Prepped and Draped in normal sterile fashion Wound explored  Laceration Location: left middle finger, tip  Laceration Length: 1.5cm  No Foreign Bodies seen or palpated  Anesthesia: local infiltration  Local anesthetic: lidocaine 1% with epinephrine  Anesthetic total: <1.5 ml  Irrigation  method: syringe Amount of cleaning: standard  Skin closure: 5-0 Prolene  Number of sutures: 2  Technique: simple interrupted  Patient tolerance: Patient tolerated the procedure well with no immediate complications.   ____________________________________________  FINAL CLINICAL IMPRESSION(S) / ED DIAGNOSES  Final diagnoses:  Laceration of left middle finger without foreign body without damage to nail, initial encounter  Laceration of left forearm, initial encounter  Fall, initial encounter    Clinical Course      NEW MEDICATIONS STARTED DURING THIS VISIT:  New Prescriptions   No medications on file      Eula Listen, MD 04/18/16 2006

## 2016-04-18 NOTE — ED Triage Notes (Signed)
Patient states he was rushing into his house and tripped over a bag and his arm hit the counter top.  Reports cut to left forearm.

## 2016-04-18 NOTE — Discharge Instructions (Signed)
You may shower with sutures in place, but do not soak your wounds until the sutures have been removed. You may take Tylenol or Motrin for pain. Please apply Neosporin, or any triple antibiotic cream, and a thick coat 3 times daily.  Return to the emergency department if you develop severe pain, numbness tingling or weakness, fever, vomiting, or any other symptoms concerning to you.

## 2016-04-18 NOTE — ED Triage Notes (Signed)
[name] foot got caught in computer bag and pt fell. Laceration to left arm. Dressing in place.  Denies hitting head.

## 2016-04-28 ENCOUNTER — Encounter: Payer: Self-pay | Admitting: Family Medicine

## 2016-04-28 ENCOUNTER — Ambulatory Visit (INDEPENDENT_AMBULATORY_CARE_PROVIDER_SITE_OTHER): Payer: Managed Care, Other (non HMO) | Admitting: Family Medicine

## 2016-04-28 VITALS — BP 122/80 | HR 67 | Temp 98.4°F | Resp 16 | Wt 329.0 lb

## 2016-04-28 DIAGNOSIS — S41112D Laceration without foreign body of left upper arm, subsequent encounter: Secondary | ICD-10-CM | POA: Diagnosis not present

## 2016-04-28 NOTE — Progress Notes (Signed)
Patient: Peter [name] Male    DOB: [birth year]   55 y.o.   MRN: [id number] Visit Date: 04/28/2016  Today's Provider: [name], MD   Chief Complaint  Patient presents with  . Suture / Staple Removal   Subjective:    Patient is here to have sutures removed from left middle finger and left forearm. Patient had lacerations from fall on 04/18/2016. Patient has 9 sutures total.   Suture / Staple Removal        No Known Allergies   Current Outpatient Prescriptions:  .  amLODipine (NORVASC) 10 MG tablet, TAKE 1 TABLET BY MOUTH EVERY DAY, Disp: 30 tablet, Rfl: 12 .  amoxicillin (AMOXIL) 875 MG tablet, Take 1 tablet (875 mg total) by mouth 2 (two) times daily., Disp: 20 tablet, Rfl: 0 .  ascorbic acid (C 500) 500 MG tablet, C 500, 500MG  (Oral Tablet)  1 every day for 0 days  Quantity: 0.00;  Refills: 0   Ordered :27-Feb-2010  Ashley Royalty ;  Started 07-Aug-2009 Active, Disp: , Rfl:  .  BYSTOLIC 10 MG tablet, TAKE 1 TABLET BY MOUTH EVERY DAY, Disp: 30 tablet, Rfl: 12 .  clobetasol ointment (TEMOVATE) 0.05 %, , Disp: , Rfl:  .  EQUETRO 200 MG CP12 12 hr capsule, , Disp: , Rfl: 7 .  fluticasone (FLONASE) 50 MCG/ACT nasal spray, Place 2 sprays into both nostrils daily., Disp: 16 g, Rfl: 6 .  Garlic 123XX123 MG TABS, Take 1 tablet by mouth 2 (two) times daily., Disp: , Rfl:  .  hydrochlorothiazide (HYDRODIURIL) 25 MG tablet, TAKE 1 TABLET BY MOUTH EVERY DAY, Disp: 30 tablet, Rfl: 12 .  lamoTRIgine (LAMICTAL) 200 MG tablet, Take by mouth., Disp: , Rfl:  .  losartan (COZAAR) 100 MG tablet, TAKE 1 TABLET BY MOUTH EVERY DAY, Disp: 30 tablet, Rfl: 12 .  Magnesium Oxide 400 MG CAPS, Take by mouth., Disp: , Rfl:  .  MAGNESIUM-OXIDE 400 (241.3 Mg) MG tablet, TAKE 1 TABLET(400 MG) BY MOUTH TWICE DAILY, Disp: 60 tablet, Rfl: 12 .  OMEGA-3 FATTY ACIDS PO, , Disp: , Rfl:  .  potassium chloride (MICRO-K) 10 MEQ CR capsule, TAKE 4 CAPSULES BY MOUTH EVERY DAY, Disp: 120 capsule, Rfl: 12 .   ranitidine (ZANTAC) 300 MG tablet, TAKE 1 TABLET(300 MG) BY MOUTH TWICE DAILY, Disp: 60 tablet, Rfl: 12 .  RUTIN, MISC. NUTRITIONAL FACTORS,, RUTIN, 500MG  (Oral Tablet)  1 every day for 0 days  Quantity: 0.00;  Refills: 0   Ordered :27-Feb-2010  Ashley Royalty ;  Started 07-Aug-2009 Active, Disp: , Rfl:  .  sertraline (ZOLOFT) 100 MG tablet, TAKE 1 TABLET BY MOUTH TWICE DAILY, Disp: 60 tablet, Rfl: 12  Review of Systems  Constitutional: Negative.   Respiratory: Negative.   Cardiovascular: Negative.   Neurological: Negative.   Psychiatric/Behavioral: Negative.     Social History  Substance Use Topics  . Smoking status: Never Smoker  . Smokeless tobacco: Never Used  . Alcohol use Yes     Comment: occasionally   Objective:   BP 122/80 (BP Location: Right Arm, Patient Position: Sitting, Cuff Size: Large)   Pulse 67   Temp 98.4 F (36.9 C) (Oral)   Resp 16   Wt (!) 329 lb (149.2 kg)   SpO2 93%   BMI 53.10 kg/m   Physical Exam  Constitutional: He appears well-developed and well-nourished.  Obese white male in no acute distress.  HENT:  Head: Normocephalic and atraumatic.  Eyes: No scleral icterus.  Cardiovascular: Normal rate and regular rhythm.   Pulmonary/Chest: Effort normal.  Skin: Skin is warm and dry.  Psychiatric: He has a normal mood and affect. His behavior is normal. Judgment and thought content normal.        Assessment & Plan:     Laceration of left forearm and left finger Sutures are removed without event. He tolerated procedure well. He tripped and fell. There was no syncopal event Hypertension Morbid obesity Patient has a an appointment in the next couple weeks for follow-up.      I have done the exam and reviewed the above chart and it is accurate to the best of my knowledge.  Lanesha Azzaro Cranford Mon, MD  [facility]

## 2016-04-28 NOTE — Patient Instructions (Signed)
Removed 9 sutures from middle finger on right hand and right forearm. Cleaned wounds and re- bandaged finger.

## 2016-05-04 ENCOUNTER — Encounter: Payer: Self-pay | Admitting: Family Medicine

## 2016-05-04 ENCOUNTER — Ambulatory Visit (INDEPENDENT_AMBULATORY_CARE_PROVIDER_SITE_OTHER): Payer: Managed Care, Other (non HMO) | Admitting: Family Medicine

## 2016-05-04 VITALS — BP 158/90 | HR 64 | Temp 98.3°F | Resp 16 | Ht 66.0 in | Wt 326.0 lb

## 2016-05-04 DIAGNOSIS — Z0000 Encounter for general adult medical examination without abnormal findings: Secondary | ICD-10-CM | POA: Diagnosis not present

## 2016-05-04 DIAGNOSIS — Z1211 Encounter for screening for malignant neoplasm of colon: Secondary | ICD-10-CM

## 2016-05-04 DIAGNOSIS — Z23 Encounter for immunization: Secondary | ICD-10-CM

## 2016-05-04 DIAGNOSIS — E669 Obesity, unspecified: Secondary | ICD-10-CM | POA: Diagnosis not present

## 2016-05-04 DIAGNOSIS — R0602 Shortness of breath: Secondary | ICD-10-CM | POA: Diagnosis not present

## 2016-05-04 DIAGNOSIS — Z6841 Body Mass Index (BMI) 40.0 and over, adult: Secondary | ICD-10-CM

## 2016-05-04 DIAGNOSIS — IMO0001 Reserved for inherently not codable concepts without codable children: Secondary | ICD-10-CM

## 2016-05-04 LAB — POCT URINALYSIS DIPSTICK
Bilirubin, UA: NEGATIVE
Blood, UA: NEGATIVE
GLUCOSE UA: NEGATIVE
Ketones, UA: NEGATIVE
Leukocytes, UA: NEGATIVE
NITRITE UA: NEGATIVE
PROTEIN UA: NEGATIVE
SPEC GRAV UA: 1.01
UROBILINOGEN UA: 0.2
pH, UA: 7.5

## 2016-05-04 LAB — IFOBT (OCCULT BLOOD): IFOBT: NEGATIVE

## 2016-05-04 NOTE — Progress Notes (Signed)
Patient: Peter [name], Male    DOB: 19-Sep-1960, 55 y.o.   MRN: [id number] Visit Date: 05/04/2016  Today's Provider: [name], MD   Chief Complaint  Patient presents with  . Annual Exam   Subjective:    Annual physical exam Peter [name] is a 55 y.o. male who presents today for health maintenance and complete physical. He feels well. He reports he is not exercising. He reports he is sleeping well. Overall patient is feeling okay. He is overwhelmed with his workload. Things at home are okay. He gets no exercise nor does he watch his diet. ----------------------------------------------------------------- Colonoscopy- 05/22/10 serrated adenoma  Review of Systems  Constitutional: Positive for diaphoresis and fatigue.  HENT: Negative.   Eyes: Negative.   Respiratory: Positive for shortness of breath (started within the last year).   Cardiovascular: Negative.   Gastrointestinal: Negative.   Endocrine: Positive for cold intolerance.  Genitourinary: Positive for frequency and urgency.  Musculoskeletal: Negative.   Skin: Negative.   Allergic/Immunologic: Negative.   Neurological: Negative.   Hematological: Negative.   Psychiatric/Behavioral: Negative.     Social History      He  reports that he has never smoked. He has never used smokeless tobacco. He reports that he drinks alcohol. He reports that he does not use drugs.       Social History   Social History  . Marital status: Married    Spouse name: N/A  . Number of children: N/A  . Years of education: N/A   Social History Main Topics  . Smoking status: Never Smoker  . Smokeless tobacco: Never Used  . Alcohol use Yes     Comment: occasionally  . Drug use: No  . Sexual activity: Not Asked   Other Topics Concern  . None   Social History Narrative  . None    Past Medical History:  Diagnosis Date  . Blood in stool   . Glaucoma    since 2004  . Hypertension    since 2000  . Obesity,  unspecified   . Ulcer ([place]) 2011   gastric  . Unspecified hemorrhoids without mention of complication      Patient Active Problem List   Diagnosis Date Noted  . 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 Avitaminosis D 03/25/2015  . Blood in stool   . Clinical depression 06/21/2011  . Cannot sleep 08/22/2009  . Hypercholesterolemia without hypertriglyceridemia 01/28/2009  . Hay fever 10/22/2008  . Adjustment disorder with mixed anxiety and depressed mood 12/12/2007  . Essential (primary) hypertension 12/12/2007  . Apnea, sleep 12/12/2007    Past Surgical History:  Procedure Laterality Date  . CHOLECYSTECTOMY  1992  . COLONOSCOPY W/ POLYPECTOMY  05/22/2010   33mmtransverse colon polyp, traditional serrated adenoma,negative for high grade dysplasia & malignancy. rectal polyp-30mm,negative for dysplasia & malignancy.  Marland Kitchen KNEE SURGERY Left 2010    Family History        Family Status  Relation Status  . Other Deceased  . Mother Deceased  . Father Deceased  . Sister Alive  . Sister Alive        His family history includes Cancer in his other; Hyperlipidemia in his mother; Hypertension in his mother.    No Known Allergies  No outpatient prescriptions have been marked as taking for the 05/04/16 encounter (Office Visit) with Jerrol Banana., MD.    Patient Care Team: Jerrol Banana., MD as PCP - General (Family Medicine)  Objective:   Vitals: There were no vitals taken for this visit.   Physical Exam  Constitutional: He is oriented to person, place, and time. He appears well-developed and well-nourished.  HENT:  Head: Normocephalic and atraumatic.  Right Ear: External ear normal.  Left Ear: External ear normal.  Nose: Nose normal.  Mouth/Throat: Oropharynx is clear and moist.  Left TM is full and bulging.  Eyes: Conjunctivae and EOM are normal. Pupils are equal, round, and reactive to light.  Neck: Normal range of motion. Neck supple.  Cardiovascular:  Normal rate, regular rhythm, normal heart sounds and intact distal pulses.   Pulmonary/Chest: Effort normal and breath sounds normal.  Abdominal: Soft. Bowel sounds are normal.  Genitourinary: Rectum normal, prostate normal and penis normal.  Musculoskeletal: Normal range of motion.  Neurological: He is alert and oriented to person, place, and time. He has normal reflexes.  Skin: Skin is warm and dry.  Psychiatric: He has a normal mood and affect. His behavior is normal. Judgment and thought content normal.     Depression Screen PHQ 2/9 Scores 03/25/2015  PHQ - 2 Score 0      Assessment & Plan:     Routine Health Maintenance and Physical Exam  Exercise Activities and Dietary recommendations Goals    None      Immunization History  Administered Date(s) Administered  . Influenza,inj,Quad PF,36+ Mos 03/25/2015  . Tdap 04/06/2011, 04/18/2016    Health Maintenance  Topic Date Due  . Hepatitis C Screening  12/25/1960  . HIV Screening  07/19/1973  . COLONOSCOPY  07/19/2008  . INFLUENZA VACCINE  02/03/2016  . TETANUS/TDAP  04/18/2026      Discussed health benefits of physical activity, and encouraged him to engage in regular exercise appropriate for his age and condition.    -------------------------------------------------------------------- 1. Annual physical exam  - POCT urinalysis dipstick - CBC with Differential/Platelet - TSH - Lipid Panel With LDL/HDL Ratio - Comprehensive metabolic panel  2. Shortness of breath Likely deconditioning, rule out anginal equivalent area - EKG 12-Lead - Ambulatory referral to Cardiology  3. Class 3 obesity with body mass index (BMI) of 50.0 to 59.9 in adult, unspecified obesity type, unspecified whether serious comorbidity present ([place]) RTC in 4 months to discuss weight loss options.  4. Obstructive sleep apnea  Patient wears CPAP nightly 5. Atypical nevi Skin care as per Dr. Darrick Huntsman from dermatology. 6. Major  depressive disorder, mild Presently in remission.     Tecla Mailloux Cranford Mon, MD  [facility]

## 2016-05-05 LAB — LIPID PANEL WITH LDL/HDL RATIO
CHOLESTEROL TOTAL: 243 mg/dL — AB (ref 100–199)
HDL: 75 mg/dL (ref >39)
LDL Calculated: 129 mg/dL — ABNORMAL HIGH (ref 0–99)
LDl/HDL Ratio: 1.7 ratio units (ref 0.0–3.6)
TRIGLYCERIDES: 193 mg/dL — AB (ref 0–149)
VLDL Cholesterol Cal: 39 mg/dL (ref 5–40)

## 2016-05-05 LAB — COMPREHENSIVE METABOLIC PANEL
A/G RATIO: 1.3 (ref 1.2–2.2)
ALBUMIN: 4.1 g/dL (ref 3.5–5.5)
ALT: 14 IU/L (ref 0–44)
AST: 21 IU/L (ref 0–40)
Alkaline Phosphatase: 130 IU/L — ABNORMAL HIGH (ref 39–117)
BUN / CREAT RATIO: 17 (ref 9–20)
BUN: 14 mg/dL (ref 6–24)
Bilirubin Total: 0.5 mg/dL (ref 0.0–1.2)
CALCIUM: 9.3 mg/dL (ref 8.7–10.2)
CO2: 31 mmol/L — AB (ref 18–29)
CREATININE: 0.82 mg/dL (ref 0.76–1.27)
Chloride: 99 mmol/L (ref 96–106)
GFR, EST AFRICAN AMERICAN: 113 mL/min/{1.73_m2} (ref >59)
GFR, EST NON AFRICAN AMERICAN: 98 mL/min/{1.73_m2} (ref >59)
GLOBULIN, TOTAL: 3.2 g/dL (ref 1.5–4.5)
Glucose: 90 mg/dL (ref 65–99)
Potassium: 3.4 mmol/L — ABNORMAL LOW (ref 3.5–5.2)
SODIUM: 143 mmol/L (ref 134–144)
TOTAL PROTEIN: 7.3 g/dL (ref 6.0–8.5)

## 2016-05-05 LAB — CBC WITH DIFFERENTIAL/PLATELET
BASOS ABS: 0 10*3/uL (ref 0.0–0.2)
Basos: 0 %
EOS (ABSOLUTE): 0.1 10*3/uL (ref 0.0–0.4)
EOS: 2 %
Hematocrit: 44.3 % (ref 37.5–51.0)
Hemoglobin: 15.6 g/dL (ref 12.6–17.7)
IMMATURE GRANULOCYTES: 0 %
Immature Grans (Abs): 0 10*3/uL (ref 0.0–0.1)
LYMPHS ABS: 2.2 10*3/uL (ref 0.7–3.1)
Lymphs: 31 %
MCH: 30.4 pg (ref 26.6–33.0)
MCHC: 35.2 g/dL (ref 31.5–35.7)
MCV: 86 fL (ref 79–97)
MONOCYTES: 7 %
MONOS ABS: 0.5 10*3/uL (ref 0.1–0.9)
NEUTROS PCT: 60 %
Neutrophils Absolute: 4.2 10*3/uL (ref 1.4–7.0)
Platelets: 228 10*3/uL (ref 150–379)
RBC: 5.13 x10E6/uL (ref 4.14–5.80)
RDW: 12.9 % (ref 12.3–15.4)
WBC: 7.1 10*3/uL (ref 3.4–10.8)

## 2016-05-05 LAB — TSH: TSH: 3.2 u[IU]/mL (ref 0.450–4.500)

## 2016-05-08 ENCOUNTER — Other Ambulatory Visit: Payer: Self-pay | Admitting: Family Medicine

## 2016-05-24 ENCOUNTER — Ambulatory Visit: Payer: BLUE CROSS/BLUE SHIELD | Admitting: Cardiology

## 2016-05-25 ENCOUNTER — Encounter: Payer: Self-pay | Admitting: Cardiology

## 2016-05-25 ENCOUNTER — Ambulatory Visit (INDEPENDENT_AMBULATORY_CARE_PROVIDER_SITE_OTHER): Payer: Managed Care, Other (non HMO) | Admitting: Cardiology

## 2016-05-25 VITALS — BP 158/100 | HR 71 | Ht 67.0 in | Wt 321.5 lb

## 2016-05-25 DIAGNOSIS — Z6841 Body Mass Index (BMI) 40.0 and over, adult: Secondary | ICD-10-CM | POA: Diagnosis not present

## 2016-05-25 DIAGNOSIS — R0602 Shortness of breath: Secondary | ICD-10-CM

## 2016-05-25 DIAGNOSIS — I10 Essential (primary) hypertension: Secondary | ICD-10-CM | POA: Diagnosis not present

## 2016-05-25 DIAGNOSIS — E6609 Other obesity due to excess calories: Secondary | ICD-10-CM | POA: Diagnosis not present

## 2016-05-25 DIAGNOSIS — IMO0001 Reserved for inherently not codable concepts without codable children: Secondary | ICD-10-CM

## 2016-05-25 NOTE — Patient Instructions (Addendum)
Testing/Procedures: Your physician has requested that you have an echocardiogram. Echocardiography is a painless test that uses sound waves to create images of your heart. It provides your doctor with information about the size and shape of your heart and how well your heart's chambers and valves are working. This procedure takes approximately one hour. There are no restrictions for this procedure.  [place]  Your caregiver has ordered a Stress Test with nuclear imaging. The purpose of this test is to evaluate the blood supply to your heart muscle. This procedure is referred to as a "Non-Invasive Stress Test." This is because other than having an IV started in your vein, nothing is inserted or "invades" your body. Cardiac stress tests are done to find areas of poor blood flow to the heart by determining the extent of coronary artery disease (CAD). Some patients exercise on a treadmill, which naturally increases the blood flow to your heart, while others who are  unable to walk on a treadmill due to physical limitations have a pharmacologic/chemical stress agent called Lexiscan . This medicine will mimic walking on a treadmill by temporarily increasing your coronary blood flow.   Please note: these test may take anywhere between 2-4 hours to complete  PLEASE REPORT TO [place] AT THE FIRST DESK WILL DIRECT YOU WHERE TO GO  Date of Procedure:_Wednesday June 09, 2016 & Thursday June 10, 2016 at 07:30AM__  Arrival Time for Procedure:_Arrive at 07:15AM to register_  Instructions regarding medication:   _X_ : Hold any diabetes medication morning of procedure  __X__:  Hold Bystolic the night before procedure and morning of procedure  _X___:  Hold hydrochlorothiazide the morning of your procedure   PLEASE NOTIFY THE OFFICE AT LEAST 24 HOURS IN ADVANCE IF YOU ARE UNABLE TO KEEP YOUR APPOINTMENT.  [phone] AND  PLEASE NOTIFY NUCLEAR MEDICINE AT [facility] AT  LEAST 24 HOURS IN ADVANCE IF YOU ARE UNABLE TO KEEP YOUR APPOINTMENT. [phone]  How to prepare for your Myoview test:  1. Do not eat or drink after midnight 2. No caffeine for 24 hours prior to test 3. No smoking 24 hours prior to test. 4. Your medication may be taken with water.  If your doctor stopped a medication because of this test, do not take that medication. 5. Ladies, please do not wear dresses.  Skirts or pants are appropriate. Please wear a short sleeve shirt. 6. No perfume, cologne or lotion. 7. Wear comfortable walking shoes. No heels!   Follow-Up: Your physician recommends that you schedule a follow-up appointment as needed with Dr. Yvone Neu. We will call you with results and if needed schedule follow up at that time.    It was a pleasure seeing you today here in the office. Please do not hesitate to give [place] a call back if you have any further questions. [place], BSN    Echocardiogram An echocardiogram, or echocardiography, uses sound waves (ultrasound) to produce an image of your heart. The echocardiogram is simple, painless, obtained within a short period of time, and offers valuable information to your health care provider. The images from an echocardiogram can provide information such as:  Evidence of coronary artery disease (CAD).  Heart size.  Heart muscle function.  Heart valve function.  Aneurysm detection.  Evidence of a past heart attack.  Fluid buildup around the heart.  Heart muscle thickening.  Assess heart valve function. Tell a health care provider about:  Any allergies you  have.  All medicines you are taking, including vitamins, herbs, eye drops, creams, and over-the-counter medicines.  Any problems you or family members have had with anesthetic medicines.  Any blood disorders you have.  Any surgeries you have had.  Any medical conditions you have.  Whether you are pregnant or may be pregnant. What happens  before the procedure? No special preparation is needed. Eat and drink normally. What happens during the procedure?  In order to produce an image of your heart, gel will be applied to your chest and a wand-like tool (transducer) will be moved over your chest. The gel will help transmit the sound waves from the transducer. The sound waves will harmlessly bounce off your heart to allow the heart images to be captured in real-time motion. These images will then be recorded.  You may need an IV to receive a medicine that improves the quality of the pictures. What happens after the procedure? You may return to your normal schedule including diet, activities, and medicines, unless your health care provider tells you otherwise. This information is not intended to replace advice given to you by your health care provider. Make sure you discuss any questions you have with your health care provider. Document Released: 06/18/2000 Document Revised: 02/07/2016 Document Reviewed: 02/26/2013 Elsevier Interactive Patient Education  2017 [place]. Pharmacologic Stress Electrocardiogram A pharmacologic stress electrocardiogram is a heart (cardiac) test that uses nuclear imaging to evaluate the blood supply to your heart. This test may also be called a pharmacologic stress electrocardiography. Pharmacologic means that a medicine is used to increase your heart rate and blood pressure.  This stress test is done to find areas of poor blood flow to the heart by determining the extent of coronary artery disease (CAD). Some people exercise on a treadmill, which naturally increases the blood flow to the heart. For those people unable to exercise on a treadmill, a medicine is used. This medicine stimulates your heart and will cause your heart to beat harder and more quickly, as if you were exercising.  Pharmacologic stress tests can help determine:  The adequacy of blood flow to your heart during increased levels of  activity in order to clear you for discharge home.  The extent of coronary artery blockage caused by CAD.  Your prognosis if you have suffered a heart attack.  The effectiveness of cardiac procedures done, such as an angioplasty, which can increase the circulation in your coronary arteries.  Causes of chest pain or pressure. LET [facility] CARE PROVIDER KNOW ABOUT:  Any allergies you have.  All medicines you are taking, including vitamins, herbs, eye drops, creams, and over-the-counter medicines.  Previous problems you or members of your family have had with the use of anesthetics.  Any blood disorders you have.  Previous surgeries you have had.  Medical conditions you have.  Possibility of pregnancy, if this applies.  If you are currently breastfeeding. RISKS AND COMPLICATIONS Generally, this is a safe procedure. However, as with any procedure, complications can occur. Possible complications include:  You develop pain or pressure in the following areas:  Chest.  Jaw or neck.  Between your shoulder blades.  Radiating down your left arm.  Headache.  Dizziness or light-headedness.  Shortness of breath.  Increased or irregular heartbeat.  Low blood pressure.  Nausea or vomiting.  Flushing.  Redness going up the arm and slight pain during injection of medicine.  Heart attack (rare). BEFORE THE PROCEDURE   Avoid all forms of caffeine  for 24 hours before your test or as directed by your health care provider. This includes coffee, tea (even decaffeinated tea), caffeinated sodas, chocolate, cocoa, and certain pain medicines.  Follow your health care provider's instructions regarding eating and drinking before the test.  Take your medicines as directed at regular times with water unless instructed otherwise. Exceptions may include:  If you have diabetes, ask how you are to take your insulin or pills. It is common to adjust insulin dosing the morning of the  test.  If you are taking beta-blocker medicines, it is important to talk to your health care provider about these medicines well before the date of your test. Taking beta-blocker medicines may interfere with the test. In some cases, these medicines need to be changed or stopped 24 hours or more before the test.  If you wear a nitroglycerin patch, it may need to be removed prior to the test. Ask your health care provider if the patch should be removed before the test.  If you use an inhaler for any breathing condition, bring it with you to the test.  If you are an outpatient, bring a snack so you can eat right after the stress phase of the test.  Do not smoke for 4 hours prior to the test or as directed by your health care provider.  Do not apply lotions, powders, creams, or oils on your chest prior to the test.  Wear comfortable shoes and clothing. Let your health care provider know if you were unable to complete or follow the preparations for your test. PROCEDURE   Multiple patches (electrodes) will be put on your chest. If needed, small areas of your chest may be shaved to get better contact with the electrodes. Once the electrodes are attached to your body, multiple wires will be attached to the electrodes, and your heart rate will be monitored.  An IV access will be started. A nuclear trace (isotope) is given. The isotope may be given intravenously, or it may be swallowed. Nuclear refers to several types of radioactive isotopes, and the nuclear isotope lights up the arteries so that the nuclear images are clear. The isotope is absorbed by your body. This results in low radiation exposure.  A resting nuclear image is taken to show how your heart functions at rest.  A medicine is given through the IV access.  A second scan is done about 1 hour after the medicine injection and determines how your heart functions under stress.  During this stress phase, you will be connected to an  electrocardiogram machine. Your blood pressure and oxygen levels will be monitored. AFTER THE PROCEDURE   Your heart rate and blood pressure will be monitored after the test.  You may return to your normal schedule, including diet,activities, and medicines, unless your health care provider tells you otherwise. This information is not intended to replace advice given to you by your health care provider. Make sure you discuss any questions you have with your health care provider. Document Released: 11/07/2008 Document Revised: 06/26/2013 Document Reviewed: 02/26/2013 Elsevier Interactive Patient Education  2017 [employer].

## 2016-05-25 NOTE — Progress Notes (Signed)
Cardiology Office Note   Date:  05/25/2016   ID:  Peter [name], DOB June 12, 1961, MRN [id number]  Referring Doctor:  [name], MD   Cardiologist:   Wende Bushy, MD   Reason for consultation:  Chief Complaint  Patient presents with  . other    Sob and elevated BP. Meds reviewed verbally with pt.      History of Present Illness: Peter [name] is a 55 y.o. male who presents forShortness of breath. This has been going on for the last 2 months. Shortness of breath is with exertion, with minimal exertion. Shortness of breath is moderate intensity, mainly in chest, nonradiating. Resolves with rest.   He denies associated chest pain, palpitations, loss of consciousness.   ROS:  Please see the history of present illness. Aside from mentioned under HPI, all other systems are reviewed and negative.     Past Medical History:  Diagnosis Date  . Blood in stool   . Glaucoma    since 2004  . Hypertension    since 2000  . Obesity, unspecified   . Ulcer ([place]) 2011   gastric  . Unspecified hemorrhoids without mention of complication     Past Surgical History:  Procedure Laterality Date  . CHOLECYSTECTOMY  1992  . COLONOSCOPY W/ POLYPECTOMY  05/22/2010   19mmtransverse colon polyp, traditional serrated adenoma,negative for high grade dysplasia & malignancy. rectal polyp-31mm,negative for dysplasia & malignancy.  Marland Kitchen KNEE SURGERY Left 2010     reports that he has never smoked. He has never used smokeless tobacco. He reports that he drinks alcohol. He reports that he does not use drugs.   family history includes Cancer in his other; Hyperlipidemia in his mother; Hypertension in his mother.   Outpatient Medications Prior to Visit  Medication Sig Dispense Refill  . amLODipine (NORVASC) 10 MG tablet TAKE 1 TABLET BY MOUTH EVERY DAY 90 tablet 2  . ascorbic acid (C 500) 500 MG tablet C 500, 500MG  (Oral Tablet)  1 every day for 0 days  Quantity: 0.00;  Refills: 0   Ordered :27-Feb-2010  Ashley Royalty ;  Started 07-Aug-2009 Active    . BYSTOLIC 10 MG tablet TAKE 1 TABLET BY MOUTH EVERY DAY 30 tablet 12  . clobetasol ointment (TEMOVATE) 0.05 % as needed.     Marland Kitchen EQUETRO 200 MG CP12 12 hr capsule Takes 2 tablets am and 3 tablets pm qd.  7  . fluticasone (FLONASE) 50 MCG/ACT nasal spray Place 2 sprays into both nostrils daily. 16 g 6  . Garlic 123XX123 MG TABS Take 1 tablet by mouth 2 (two) times daily.    . hydrochlorothiazide (HYDRODIURIL) 25 MG tablet TAKE 1 TABLET BY MOUTH EVERY DAY 90 tablet 2  . lamoTRIgine (LAMICTAL) 200 MG tablet Take 400 mg by mouth daily.     Marland Kitchen losartan (COZAAR) 100 MG tablet TAKE 1 TABLET BY MOUTH EVERY DAY 90 tablet 2  . Magnesium Oxide 400 MG CAPS Take by mouth.    . OMEGA-3 FATTY ACIDS PO daily.     . potassium chloride (MICRO-K) 10 MEQ CR capsule TAKE 4 CAPSULES BY MOUTH EVERY DAY 120 capsule 12  . ranitidine (ZANTAC) 300 MG tablet TAKE 1 TABLET(300 MG) BY MOUTH TWICE DAILY 60 tablet 12  . RUTIN, MISC. NUTRITIONAL FACTORS, RUTIN, 500MG  (Oral Tablet)  1 every day for 0 days  Quantity: 0.00;  Refills: 0   Ordered :27-Feb-2010  Ashley Royalty ;  Started 07-Aug-2009 Active    .  sertraline (ZOLOFT) 100 MG tablet TAKE 1 TABLET BY MOUTH TWICE DAILY 180 tablet 2  . amoxicillin (AMOXIL) 875 MG tablet Take 1 tablet (875 mg total) by mouth 2 (two) times daily. (Patient not taking: Reported on 05/25/2016) 20 tablet 0   No facility-administered medications prior to visit.      Allergies: Patient has no known allergies.    PHYSICAL EXAM: VS:  BP (!) 158/100 (BP Location: Right Arm, Patient Position: Sitting, Cuff Size: Large)   Pulse 71   Ht 5\' 7"  (1.702 m)   Wt (!) 321 lb 8 oz (145.8 kg)   BMI 50.35 kg/m  , Body mass index is 50.35 kg/m. Wt Readings from Last 3 Encounters:  05/25/16 (!) 321 lb 8 oz (145.8 kg)  05/04/16 (!) 326 lb (147.9 kg)  04/28/16 (!) 329 lb (149.2 kg)    GENERAL:  well developed, well nourished, obese, not in  acute distress HEENT: normocephalic, pink conjunctivae, anicteric sclerae, no xanthelasma, normal dentition, oropharynx clear NECK:  no neck vein engorgement, JVP normal, no hepatojugular reflux, carotid upstroke brisk and symmetric, no bruit, no thyromegaly, no lymphadenopathy LUNGS:  good respiratory effort, clear to auscultation bilaterally CV:  PMI not displaced, no thrills, no lifts, S1 and S2 within normal limits, no palpable S3 or S4, no murmurs, no rubs, no gallops ABD:  Soft, nontender, nondistended, normoactive bowel sounds, no abdominal aortic bruit, no hepatomegaly, no splenomegaly MS: nontender back, no kyphosis, no scoliosis, no joint deformities EXT:  2+ DP/PT pulses, no edema, no varicosities, no cyanosis, no clubbing SKIN: warm, nondiaphoretic, normal turgor, no ulcers NEUROPSYCH: alert, oriented to person, place, and time, sensory/motor grossly intact, normal mood, appropriate affect  Recent Labs: 10/13/2015: Hemoglobin 15.6 05/04/2016: ALT 14; BUN 14; Creatinine, Ser 0.82; Platelets 228; Potassium 3.4; Sodium 143; TSH 3.200   Lipid Panel    Component Value Date/Time   CHOL 243 (H) 05/04/2016 1021   TRIG 193 (H) 05/04/2016 1021   HDL 75 05/04/2016 1021   LDLCALC 129 (H) 05/04/2016 1021     Other studies Reviewed:  EKG:  The ekg from 05/25/2016 was personally reviewed by me and it revealed sinus rhythm, 71 BPM.  Additional studies/ records that were reviewed personally reviewed by me today include: None available  ASSESSMENT AND PLAN:  Shortness breath Risk factors for CAD include age, hypertension, obesity Patient unable to walk on the treadmill due to knee issues. Recommend pharmacologic nuclear stress is. Recommend echocardiogram  Hypertension Per patient, blood pressure is in the 130s to 140s over 80s at home. Continue blood pressure log. Lifestyle changes recommended. Sodium restriction recommended.  Obesity Body mass index is 50.35 kg/m.Marland Kitchen Recommend  aggressive weight loss through diet and increased physical activity. Once cardiac workup is completed   Current medicines are reviewed at length with the patient today.  The patient does not have concerns regarding medicines.  Labs/ tests ordered today include:  Orders Placed This Encounter  Procedures  . NM Myocar Multi W/Spect W/Wall Motion / EF  . EKG 12-Lead  . ECHOCARDIOGRAM COMPLETE    I had a lengthy and detailed discussion with the patient regarding diagnoses, prognosis, diagnostic options, treatment options , and side effects of medications.   I counseled the patient on importance of lifestyle modification including heart healthy diet, regular physical activity Once cardiac workup is completed.   Disposition:   FU with undersigned after tests when necessary Signed, Wende Bushy, MD  05/25/2016 11:45 AM    [facility]  This note was generated in part with voice recognition software and I apologize for any typographical errors that were not detected and corrected.

## 2016-06-09 ENCOUNTER — Encounter
Admission: RE | Admit: 2016-06-09 | Discharge: 2016-06-09 | Disposition: A | Discharge status: Home / Self Care | Payer: Managed Care, Other (non HMO) | Source: Ambulatory Visit | Attending: Cardiology | Admitting: Cardiology

## 2016-06-09 DIAGNOSIS — I10 Essential (primary) hypertension: Secondary | ICD-10-CM

## 2016-06-09 DIAGNOSIS — R0602 Shortness of breath: Secondary | ICD-10-CM | POA: Diagnosis not present

## 2016-06-09 MED ORDER — REGADENOSON 0.4 MG/5ML IV SOLN
0.4000 mg | Freq: Once | INTRAVENOUS | Status: AC
  Administered 2016-06-09: 0.4 mg via INTRAVENOUS

## 2016-06-09 MED ORDER — TECHNETIUM TC 99M TETROFOSMIN IV KIT
30.4300 | PACK | Freq: Once | INTRAVENOUS | Status: AC | PRN
  Administered 2016-06-09: 30.43 via INTRAVENOUS

## 2016-06-10 ENCOUNTER — Telehealth: Payer: Self-pay | Admitting: *Deleted

## 2016-06-10 ENCOUNTER — Encounter
Admission: RE | Admit: 2016-06-10 | Discharge: 2016-06-10 | Disposition: A | Discharge status: Home / Self Care | Payer: Managed Care, Other (non HMO) | Source: Ambulatory Visit | Attending: Cardiology | Admitting: Cardiology

## 2016-06-10 ENCOUNTER — Encounter: Payer: Self-pay | Admitting: *Deleted

## 2016-06-10 ENCOUNTER — Other Ambulatory Visit: Payer: Self-pay | Admitting: Cardiology

## 2016-06-10 DIAGNOSIS — R0602 Shortness of breath: Secondary | ICD-10-CM | POA: Diagnosis not present

## 2016-06-10 DIAGNOSIS — R9439 Abnormal result of other cardiovascular function study: Secondary | ICD-10-CM

## 2016-06-10 DIAGNOSIS — Z01812 Encounter for preprocedural laboratory examination: Secondary | ICD-10-CM

## 2016-06-10 LAB — NM MYOCAR MULTI W/SPECT W/WALL MOTION / EF
CHL CUP NUCLEAR SDS: 11
CHL CUP NUCLEAR SSS: 15
LVDIAVOL: 123 mL (ref 62–150)
LVSYSVOL: 43 mL
NUC STRESS TID: 0.87
Peak HR: 99 {beats}/min
Percent HR: 60 %
Rest HR: 68 {beats}/min
SRS: 6

## 2016-06-10 MED ORDER — ISOSORBIDE MONONITRATE ER 30 MG PO TB24: 30.0000 mg | ORAL_TABLET | Freq: Every day | ORAL | 6 refills | Status: DC

## 2016-06-10 MED ORDER — ATORVASTATIN CALCIUM 40 MG PO TABS: 40.0000 mg | ORAL_TABLET | Freq: Every day | ORAL | 6 refills | Status: DC

## 2016-06-10 MED ORDER — TECHNETIUM TC 99M TETROFOSMIN IV KIT
31.4300 | PACK | Freq: Once | INTRAVENOUS | Status: AC | PRN
  Administered 2016-06-10: 31.43 via INTRAVENOUS

## 2016-06-10 NOTE — Telephone Encounter (Signed)
Thanks

## 2016-06-10 NOTE — Telephone Encounter (Signed)
-----   Message from Wende Bushy, MD sent at 06/10/2016 12:29 PM EST ----- Called patient. Spoke to him on the phone and discussed at length the results of his stress test. He continues to have symptoms. reommended to proceed with left heart catheterization for further evaluation. Risks and benefits of cardiac catheterization have been discussed with the patient.  These include less than 1% chance of major complications including but not limited to bleeding, infection, kidney damage, arrhyhthmia, heart attack, stroke, and death.  The patient understands these risks and is willing to proceed. Please call him again to schedule the heart cath. Patient on an aspirin 81 mg by mouth daily. Please send in Imdur ER 30 mg by mouth daily, atorvastatin 40 mg by mouth daily at bedtime. Thank you.

## 2016-06-10 NOTE — Telephone Encounter (Signed)
Spoke with patient and reviewed recommendations by Dr. Yvone Neu. Scheduled left heart cath for Tuesday June 15, 2016 at 09:30AM with arrival time of 08:30AM. Discussed labs that are needed prior to procedure and medications that were ordered. He wants to go to [place] to have labs done and will come by to pick up the slips and letter with cath instructions. He verbalized understanding of our conversation and had no further questions at this time. Instructed him to please give me a call if he has any questions.

## 2016-06-11 ENCOUNTER — Telehealth: Payer: Self-pay | Admitting: Cardiology

## 2016-06-11 NOTE — Telephone Encounter (Signed)
-----   Message from Valora Corporal, RN sent at 06/10/2016  5:55 PM EST ----- Regarding: appt Patient needs follow up with [facility] on 12/29 or the first week of January. Could you call and schedule this for me?   Thanks, Lesleigh Noe RN, BSN

## 2016-06-11 NOTE — Telephone Encounter (Signed)
Lmov for patient to call back and schedule fu appointment  Will try again later

## 2016-06-14 ENCOUNTER — Telehealth: Payer: Self-pay

## 2016-06-14 ENCOUNTER — Telehealth: Payer: Self-pay | Admitting: Cardiology

## 2016-06-14 ENCOUNTER — Other Ambulatory Visit: Payer: Self-pay | Admitting: Cardiology

## 2016-06-14 NOTE — Telephone Encounter (Signed)
S/w with patient.  He was prescribed Isosorbide by Dr Yvone Neu last week. He took his first dose Friday, and got a severe headache. He took the second dose Saturday and the same thing happened. He said the pain level was "5" on 0-5 pain scale.  He said he has had hx migraines and this was "worse than a migraine." He did not take the Isosorbide Sunday (yesterday) or today. Patient BP yesterday 150/90, has not taken today. He does not have the headache today or yesterday. He is for Left heart cath tomorrow, 06/15/16 with Dr End.  Dr Yvone Neu out of office. Will route to Dr Rockey Situ for advice.

## 2016-06-14 NOTE — Telephone Encounter (Signed)
Pt calling stating he was prescribed Isosorbide  Having major headaches And having to stop taking it  Stopped taking it yesterday  Please advise

## 2016-06-14 NOTE — Telephone Encounter (Signed)
-----   Message from Nelva Bush, MD sent at 06/14/2016  1:47 PM EST ----- Regarding: Time change for cath Hello,  Due to my first cath for tomorrow being rescheduled, could you call Mr. Popoca and have him come in an hour earlier?  We will plan to start his cath ~8:30.  I have already spoken with the cath lab to have this changed on the cath board.  Thanks.  Gerald Stabs

## 2016-06-14 NOTE — Telephone Encounter (Signed)
Spoke w/ pt.  Advised him of Dr. Darnelle Bos recommendation.  He is agreeable to arriving @ the [facility] tomorrow @ 7:30am.

## 2016-06-15 ENCOUNTER — Encounter
Admission: RE | Disposition: A | Discharge status: Home / Self Care | Payer: Self-pay | Source: Ambulatory Visit | Attending: Internal Medicine

## 2016-06-15 ENCOUNTER — Ambulatory Visit
Admission: RE | Admit: 2016-06-15 | Discharge: 2016-06-15 | Disposition: A | Discharge status: Home / Self Care | Payer: Managed Care, Other (non HMO) | Source: Ambulatory Visit | Attending: Internal Medicine | Admitting: Internal Medicine

## 2016-06-15 ENCOUNTER — Encounter: Payer: Self-pay | Admitting: *Deleted

## 2016-06-15 ENCOUNTER — Other Ambulatory Visit: Payer: Self-pay | Admitting: Internal Medicine

## 2016-06-15 DIAGNOSIS — E876 Hypokalemia: Secondary | ICD-10-CM | POA: Diagnosis not present

## 2016-06-15 DIAGNOSIS — Z6841 Body Mass Index (BMI) 40.0 and over, adult: Secondary | ICD-10-CM | POA: Diagnosis not present

## 2016-06-15 DIAGNOSIS — H409 Unspecified glaucoma: Secondary | ICD-10-CM | POA: Insufficient documentation

## 2016-06-15 DIAGNOSIS — R0602 Shortness of breath: Secondary | ICD-10-CM | POA: Insufficient documentation

## 2016-06-15 DIAGNOSIS — Z79899 Other long term (current) drug therapy: Secondary | ICD-10-CM | POA: Diagnosis not present

## 2016-06-15 DIAGNOSIS — E669 Obesity, unspecified: Secondary | ICD-10-CM | POA: Diagnosis not present

## 2016-06-15 DIAGNOSIS — Z7951 Long term (current) use of inhaled steroids: Secondary | ICD-10-CM | POA: Insufficient documentation

## 2016-06-15 DIAGNOSIS — Z5309 Procedure and treatment not carried out because of other contraindication: Secondary | ICD-10-CM | POA: Insufficient documentation

## 2016-06-15 DIAGNOSIS — Z7982 Long term (current) use of aspirin: Secondary | ICD-10-CM | POA: Insufficient documentation

## 2016-06-15 DIAGNOSIS — I10 Essential (primary) hypertension: Secondary | ICD-10-CM | POA: Insufficient documentation

## 2016-06-15 DIAGNOSIS — R9439 Abnormal result of other cardiovascular function study: Secondary | ICD-10-CM

## 2016-06-15 LAB — CBC
HEMATOCRIT: 44.3 % (ref 40.0–52.0)
Hemoglobin: 15.7 g/dL (ref 13.0–18.0)
MCH: 30.1 pg (ref 26.0–34.0)
MCHC: 35.5 g/dL (ref 32.0–36.0)
MCV: 85 fL (ref 80.0–100.0)
Platelets: 204 10*3/uL (ref 150–440)
RBC: 5.22 MIL/uL (ref 4.40–5.90)
RDW: 13 % (ref 11.5–14.5)
WBC: 6.6 10*3/uL (ref 3.8–10.6)

## 2016-06-15 LAB — BASIC METABOLIC PANEL
ANION GAP: 8 (ref 5–15)
BUN / CREAT RATIO: 24 — AB (ref 9–20)
BUN: 20 mg/dL (ref 6–24)
BUN: 25 mg/dL — ABNORMAL HIGH (ref 6–20)
CALCIUM: 9 mg/dL (ref 8.9–10.3)
CO2: 31 mmol/L — ABNORMAL HIGH (ref 18–29)
CO2: 29 mmol/L (ref 22–32)
CREATININE: 0.82 mg/dL (ref 0.76–1.27)
Calcium: 9.1 mg/dL (ref 8.7–10.2)
Chloride: 101 mmol/L (ref 96–106)
Chloride: 102 mmol/L (ref 101–111)
Creatinine, Ser: 0.83 mg/dL (ref 0.61–1.24)
GFR calc non Af Amer: 98 mL/min/{1.73_m2} (ref >59)
GFR, EST AFRICAN AMERICAN: 113 mL/min/{1.73_m2} (ref >59)
GLUCOSE: 93 mg/dL (ref 65–99)
Glucose, Bld: 103 mg/dL — ABNORMAL HIGH (ref 65–99)
Potassium: 3.4 mmol/L — ABNORMAL LOW (ref 3.5–5.2)
Potassium: 2.7 mmol/L — CL (ref 3.5–5.1)
SODIUM: 146 mmol/L — AB (ref 134–144)
SODIUM: 139 mmol/L (ref 135–145)

## 2016-06-15 LAB — CBC WITH DIFFERENTIAL/PLATELET
BASOS ABS: 0 10*3/uL (ref 0.0–0.2)
BASOS: 0 %
EOS (ABSOLUTE): 0.1 10*3/uL (ref 0.0–0.4)
Eos: 2 %
Hematocrit: 43.6 % (ref 37.5–51.0)
Hemoglobin: 15.6 g/dL (ref 13.0–17.7)
Immature Grans (Abs): 0 10*3/uL (ref 0.0–0.1)
Immature Granulocytes: 0 %
LYMPHS ABS: 1.9 10*3/uL (ref 0.7–3.1)
Lymphs: 27 %
MCH: 30.1 pg (ref 26.6–33.0)
MCHC: 35.8 g/dL — AB (ref 31.5–35.7)
MCV: 84 fL (ref 79–97)
Monocytes Absolute: 0.6 10*3/uL (ref 0.1–0.9)
Monocytes: 8 %
NEUTROS ABS: 4.4 10*3/uL (ref 1.4–7.0)
NEUTROS PCT: 63 %
PLATELETS: 209 10*3/uL (ref 150–379)
RBC: 5.19 x10E6/uL (ref 4.14–5.80)
RDW: 13.3 % (ref 12.3–15.4)
WBC: 7.1 10*3/uL (ref 3.4–10.8)

## 2016-06-15 LAB — PROTIME-INR
INR: 1 (ref 0.8–1.2)
INR: 0.97
Prothrombin Time: 10.4 s (ref 9.1–12.0)
Prothrombin Time: 12.9 seconds (ref 11.4–15.2)

## 2016-06-15 SURGERY — LEFT HEART CATH AND CORONARY ANGIOGRAPHY
Anesthesia: Moderate Sedation | Laterality: Left

## 2016-06-15 MED ORDER — VERAPAMIL HCL 2.5 MG/ML IV SOLN
INTRAVENOUS | Status: AC
  Filled 2016-06-15: qty 2

## 2016-06-15 MED ORDER — POTASSIUM CHLORIDE CRYS ER 20 MEQ PO TBCR
40.0000 meq | EXTENDED_RELEASE_TABLET | Freq: Once | ORAL | Status: AC
  Administered 2016-06-15: 40 meq via ORAL

## 2016-06-15 MED ORDER — HEPARIN (PORCINE) IN NACL 2-0.9 UNIT/ML-% IJ SOLN
INTRAMUSCULAR | Status: AC
  Filled 2016-06-15: qty 1000

## 2016-06-15 MED ORDER — SODIUM CHLORIDE 0.9% FLUSH: 3.0000 mL | INTRAVENOUS | Status: DC | PRN

## 2016-06-15 MED ORDER — SODIUM CHLORIDE 0.9 % IV SOLN
INTRAVENOUS | Status: DC
  Administered 2016-06-15: 09:00:00 via INTRAVENOUS

## 2016-06-15 MED ORDER — SODIUM CHLORIDE 0.9 % IV SOLN: 250.0000 mL | INTRAVENOUS | Status: DC | PRN

## 2016-06-15 MED ORDER — FENTANYL CITRATE (PF) 100 MCG/2ML IJ SOLN
INTRAMUSCULAR | Status: AC
  Filled 2016-06-15: qty 2

## 2016-06-15 MED ORDER — POTASSIUM CHLORIDE ER 10 MEQ PO TBCR: EXTENDED_RELEASE_TABLET | ORAL | 3 refills | Status: DC

## 2016-06-15 MED ORDER — HEPARIN SODIUM (PORCINE) 1000 UNIT/ML IJ SOLN
INTRAMUSCULAR | Status: AC
  Filled 2016-06-15: qty 1

## 2016-06-15 MED ORDER — SPIRONOLACTONE 25 MG PO TABS: 25.0000 mg | ORAL_TABLET | Freq: Every day | ORAL | 5 refills | Status: DC

## 2016-06-15 MED ORDER — POTASSIUM CHLORIDE CRYS ER 20 MEQ PO TBCR
EXTENDED_RELEASE_TABLET | ORAL | Status: AC
  Filled 2016-06-15: qty 2

## 2016-06-15 MED ORDER — SODIUM CHLORIDE 0.9% FLUSH: 3.0000 mL | Freq: Two times a day (BID) | INTRAVENOUS | Status: DC

## 2016-06-15 MED ORDER — MIDAZOLAM HCL 2 MG/2ML IJ SOLN
INTRAMUSCULAR | Status: AC
  Filled 2016-06-15: qty 2

## 2016-06-15 NOTE — Progress Notes (Signed)
Cardiac Catheterization Note  Date: 06/15/16 Time: 10:22 AM  Patient presented for elective LHC today due to progressive shortness of breath and abnormal stress test.  Pre-cath labs obtained today showed marked hyperkalemia with K 2.7 (patient has had mild hypokalemia in the past).  Labs are otherwise normal.  BP is significantly elevated.  Patient is asymptomatic at this time.  We have agreed to cancel catheterization and administer KCl 40 mEq x 1 now.  Patient will take another 40 mEq this evening, followed by 20 mEq BID thereafter.  We will also start spironolactone 25 mg daily for his persistent hypokalemia and hypertension (? Underlying hyperaldosteronism).  The patient will return to the clinic in 2 days (06/17/16) for BMP.  We anticipate rescheduling his LHC for next week, based on the results of his BMP later this week.  Nelva Bush, MD [facility] Pager: [phone]

## 2016-06-15 NOTE — Telephone Encounter (Signed)
Patient was in today for cath and started on Spirolactone. Will confirm with Dr. Rockey Situ on starting the cardura.

## 2016-06-15 NOTE — Progress Notes (Signed)
Cardiac Catheterization Note  Date: 06/15/16 Time: 10:22 AM  Patient presented for elective LHC today due to progressive shortness of breath and abnormal stress test.  Pre-cath labs obtained today showed marked hyperkalemia with K 2.7 (patient has had mild hypokalemia in the past).  Labs are otherwise normal.  BP is significantly elevated.  Patient is asymptomatic at this time.  We have agreed to cancel catheterization and administer KCl 40 mEq x 1 now.  Patient will take another 40 mEq this evening, followed by 20 mEq BID thereafter.  We will also start spironolactone 25 mg daily for his persistent hypokalemia and hypertension (? Underlying hyperaldosteronism).  The patient will return to the clinic in 2 days (06/17/16) for BMP.  We anticipate rescheduling his LHC for next week, based on the results of his BMP later this week.  Nelva Bush, MD [facility] Pager: [phone]

## 2016-06-15 NOTE — H&P (View-Only) (Signed)
Cardiology Office Note   Date:  05/25/2016   ID:  Peter [name], DOB [birth year], MRN [id number]  Referring Doctor:  [name], MD   Cardiologist:   Wende Bushy, MD   Reason for consultation:  Chief Complaint  Patient presents with  . other    Sob and elevated BP. Meds reviewed verbally with pt.      History of Present Illness: Peter [name] is a 55 y.o. male who presents forShortness of breath. This has been going on for the last 2 months. Shortness of breath is with exertion, with minimal exertion. Shortness of breath is moderate intensity, mainly in chest, nonradiating. Resolves with rest.   He denies associated chest pain, palpitations, loss of consciousness.   ROS:  Please see the history of present illness. Aside from mentioned under HPI, all other systems are reviewed and negative.     Past Medical History:  Diagnosis Date  . Blood in stool   . Glaucoma    since 2004  . Hypertension    since 2000  . Obesity, unspecified   . Ulcer ([place]) 2011   gastric  . Unspecified hemorrhoids without mention of complication     Past Surgical History:  Procedure Laterality Date  . CHOLECYSTECTOMY  1992  . COLONOSCOPY W/ POLYPECTOMY  05/22/2010   20mmtransverse colon polyp, traditional serrated adenoma,negative for high grade dysplasia & malignancy. rectal polyp-37mm,negative for dysplasia & malignancy.  Marland Kitchen KNEE SURGERY Left 2010     reports that he has never smoked. He has never used smokeless tobacco. He reports that he drinks alcohol. He reports that he does not use drugs.   family history includes Cancer in his other; Hyperlipidemia in his mother; Hypertension in his mother.   Outpatient Medications Prior to Visit  Medication Sig Dispense Refill  . amLODipine (NORVASC) 10 MG tablet TAKE 1 TABLET BY MOUTH EVERY DAY 90 tablet 2  . ascorbic acid (C 500) 500 MG tablet C 500, 500MG  (Oral Tablet)  1 every day for 0 days  Quantity: 0.00;  Refills: 0   Ordered :27-Feb-2010  Ashley Royalty ;  Started 07-Aug-2009 Active    . BYSTOLIC 10 MG tablet TAKE 1 TABLET BY MOUTH EVERY DAY 30 tablet 12  . clobetasol ointment (TEMOVATE) 0.05 % as needed.     Marland Kitchen EQUETRO 200 MG CP12 12 hr capsule Takes 2 tablets am and 3 tablets pm qd.  7  . fluticasone (FLONASE) 50 MCG/ACT nasal spray Place 2 sprays into both nostrils daily. 16 g 6  . Garlic 123XX123 MG TABS Take 1 tablet by mouth 2 (two) times daily.    . hydrochlorothiazide (HYDRODIURIL) 25 MG tablet TAKE 1 TABLET BY MOUTH EVERY DAY 90 tablet 2  . lamoTRIgine (LAMICTAL) 200 MG tablet Take 400 mg by mouth daily.     Marland Kitchen losartan (COZAAR) 100 MG tablet TAKE 1 TABLET BY MOUTH EVERY DAY 90 tablet 2  . Magnesium Oxide 400 MG CAPS Take by mouth.    . OMEGA-3 FATTY ACIDS PO daily.     . potassium chloride (MICRO-K) 10 MEQ CR capsule TAKE 4 CAPSULES BY MOUTH EVERY DAY 120 capsule 12  . ranitidine (ZANTAC) 300 MG tablet TAKE 1 TABLET(300 MG) BY MOUTH TWICE DAILY 60 tablet 12  . RUTIN, MISC. NUTRITIONAL FACTORS, RUTIN, 500MG  (Oral Tablet)  1 every day for 0 days  Quantity: 0.00;  Refills: 0   Ordered :27-Feb-2010  Ashley Royalty ;  Started 07-Aug-2009 Active    .  sertraline (ZOLOFT) 100 MG tablet TAKE 1 TABLET BY MOUTH TWICE DAILY 180 tablet 2  . amoxicillin (AMOXIL) 875 MG tablet Take 1 tablet (875 mg total) by mouth 2 (two) times daily. (Patient not taking: Reported on 05/25/2016) 20 tablet 0   No facility-administered medications prior to visit.      Allergies: Patient has no known allergies.    PHYSICAL EXAM: VS:  BP (!) 158/100 (BP Location: Right Arm, Patient Position: Sitting, Cuff Size: Large)   Pulse 71   Ht 5\' 7"  (1.702 m)   Wt (!) 321 lb 8 oz (145.8 kg)   BMI 50.35 kg/m  , Body mass index is 50.35 kg/m. Wt Readings from Last 3 Encounters:  05/25/16 (!) 321 lb 8 oz (145.8 kg)  05/04/16 (!) 326 lb (147.9 kg)  04/28/16 (!) 329 lb (149.2 kg)    GENERAL:  well developed, well nourished, obese, not in  acute distress HEENT: normocephalic, pink conjunctivae, anicteric sclerae, no xanthelasma, normal dentition, oropharynx clear NECK:  no neck vein engorgement, JVP normal, no hepatojugular reflux, carotid upstroke brisk and symmetric, no bruit, no thyromegaly, no lymphadenopathy LUNGS:  good respiratory effort, clear to auscultation bilaterally CV:  PMI not displaced, no thrills, no lifts, S1 and S2 within normal limits, no palpable S3 or S4, no murmurs, no rubs, no gallops ABD:  Soft, nontender, nondistended, normoactive bowel sounds, no abdominal aortic bruit, no hepatomegaly, no splenomegaly MS: nontender back, no kyphosis, no scoliosis, no joint deformities EXT:  2+ DP/PT pulses, no edema, no varicosities, no cyanosis, no clubbing SKIN: warm, nondiaphoretic, normal turgor, no ulcers NEUROPSYCH: alert, oriented to person, place, and time, sensory/motor grossly intact, normal mood, appropriate affect  Recent Labs: 10/13/2015: Hemoglobin 15.6 05/04/2016: ALT 14; BUN 14; Creatinine, Ser 0.82; Platelets 228; Potassium 3.4; Sodium 143; TSH 3.200   Lipid Panel    Component Value Date/Time   CHOL 243 (H) 05/04/2016 1021   TRIG 193 (H) 05/04/2016 1021   HDL 75 05/04/2016 1021   LDLCALC 129 (H) 05/04/2016 1021     Other studies Reviewed:  EKG:  The ekg from 05/25/2016 was personally reviewed by me and it revealed sinus rhythm, 71 BPM.  Additional studies/ records that were reviewed personally reviewed by me today include: None available  ASSESSMENT AND PLAN:  Shortness breath Risk factors for CAD include age, hypertension, obesity Patient unable to walk on the treadmill due to knee issues. Recommend pharmacologic nuclear stress is. Recommend echocardiogram  Hypertension Per patient, blood pressure is in the 130s to 140s over 80s at home. Continue blood pressure log. Lifestyle changes recommended. Sodium restriction recommended.  Obesity Body mass index is 50.35 kg/m.Marland Kitchen Recommend  aggressive weight loss through diet and increased physical activity. Once cardiac workup is completed   Current medicines are reviewed at length with the patient today.  The patient does not have concerns regarding medicines.  Labs/ tests ordered today include:  Orders Placed This Encounter  Procedures  . NM Myocar Multi W/Spect W/Wall Motion / EF  . EKG 12-Lead  . ECHOCARDIOGRAM COMPLETE    I had a lengthy and detailed discussion with the patient regarding diagnoses, prognosis, diagnostic options, treatment options , and side effects of medications.   I counseled the patient on importance of lifestyle modification including heart healthy diet, regular physical activity Once cardiac workup is completed.   Disposition:   FU with undersigned after tests when necessary Signed, Wende Bushy, MD  05/25/2016 11:45 AM    [place]  [facility]  This note was generated in part with voice recognition software and I apologize for any typographical errors that were not detected and corrected.

## 2016-06-15 NOTE — Progress Notes (Signed)
Dr. Saunders Revel here to see patient and cancel case for today because of low potassium.  Potassium oral supplement given as ordered and patient prepares for discharge

## 2016-06-15 NOTE — Interval H&P Note (Signed)
History and Physical Interval Note:  06/15/2016 8:26 AM  Peter [name]  has presented today for cardiac catheterization, with the diagnosis of shortness of breath and abnormal stress test. The various methods of treatment have been discussed with the patient and family. After consideration of risks, benefits and other options for treatment, the patient has consented to  Procedure(s): Left Heart Cath and Coronary Angiography (Left) as a surgical intervention .  The patient's history has been reviewed, patient examined, no change in status, stable for surgery.  I have reviewed the patient's chart and labs.  Questions were answered to the patient's satisfaction.    Cath Lab Visit (complete for each Cath Lab visit)  Clinical Evaluation Leading to the Procedure:   ACS: No.  Non-ACS:    Anginal Classification: CCS III  Anti-ischemic medical therapy: Maximal Therapy (2 or more classes of medications)  Non-Invasive Test Results: High-risk stress test findings: cardiac mortality >3%/year  Prior CABG: No previous CABG  Peter [name]

## 2016-06-15 NOTE — Telephone Encounter (Signed)
If unable to tolerate isosorbide He could try Cardura 4 mg twice a day Could give as 8 mg pill, cut in half twice a day Would monitor blood pressure

## 2016-06-16 NOTE — Telephone Encounter (Signed)
Would try one medication at a time in case there is side effects Perhaps try the Cardura first

## 2016-06-17 ENCOUNTER — Other Ambulatory Visit (INDEPENDENT_AMBULATORY_CARE_PROVIDER_SITE_OTHER): Payer: Managed Care, Other (non HMO)

## 2016-06-17 ENCOUNTER — Other Ambulatory Visit: Payer: Self-pay

## 2016-06-17 DIAGNOSIS — Z01812 Encounter for preprocedural laboratory examination: Secondary | ICD-10-CM | POA: Diagnosis not present

## 2016-06-17 DIAGNOSIS — E876 Hypokalemia: Secondary | ICD-10-CM

## 2016-06-17 MED ORDER — DOXAZOSIN MESYLATE 8 MG PO TABS: 4.0000 mg | ORAL_TABLET | Freq: Every day | ORAL | 6 refills | Status: DC

## 2016-06-17 NOTE — Telephone Encounter (Signed)
Spoke with patient and reviewed Dr. Donivan Scull recommendations to try Cardura first since the isosorbide mononitrate didn't work for him. Patient states that he started the spironolactone and instructed him to discontinue this and start on the Cardura and monitor his blood pressures. Instructed him to take 1/2 tablet twice daily of the Cardura and give [place] a call if he has any questions. He verbalized understanding of our conversation, instructions regarding medication, and monitoring of his blood pressure. He had no further questions at this time and will be in today to have some labs done.

## 2016-06-18 LAB — CBC WITH DIFFERENTIAL/PLATELET
BASOS ABS: 0 10*3/uL (ref 0.0–0.2)
Basos: 0 %
EOS (ABSOLUTE): 0.1 10*3/uL (ref 0.0–0.4)
EOS: 2 %
HEMATOCRIT: 44.2 % (ref 37.5–51.0)
HEMOGLOBIN: 15.4 g/dL (ref 13.0–17.7)
Immature Grans (Abs): 0 10*3/uL (ref 0.0–0.1)
Immature Granulocytes: 0 %
LYMPHS ABS: 2.5 10*3/uL (ref 0.7–3.1)
Lymphs: 33 %
MCH: 30.3 pg (ref 26.6–33.0)
MCHC: 34.8 g/dL (ref 31.5–35.7)
MCV: 87 fL (ref 79–97)
MONOCYTES: 6 %
MONOS ABS: 0.5 10*3/uL (ref 0.1–0.9)
NEUTROS ABS: 4.4 10*3/uL (ref 1.4–7.0)
Neutrophils: 59 %
Platelets: 215 10*3/uL (ref 150–379)
RBC: 5.09 x10E6/uL (ref 4.14–5.80)
RDW: 13.2 % (ref 12.3–15.4)
WBC: 7.5 10*3/uL (ref 3.4–10.8)

## 2016-06-18 LAB — BASIC METABOLIC PANEL
BUN/Creatinine Ratio: 33 — ABNORMAL HIGH (ref 9–20)
BUN: 21 mg/dL (ref 6–24)
CO2: 24 mmol/L (ref 18–29)
CREATININE: 0.64 mg/dL — AB (ref 0.76–1.27)
Calcium: 9.3 mg/dL (ref 8.7–10.2)
Chloride: 105 mmol/L (ref 96–106)
GFR calc Af Amer: 126 mL/min/{1.73_m2} (ref >59)
GFR, EST NON AFRICAN AMERICAN: 109 mL/min/{1.73_m2} (ref >59)
Glucose: 105 mg/dL — ABNORMAL HIGH (ref 65–99)
POTASSIUM: 3.5 mmol/L (ref 3.5–5.2)
SODIUM: 147 mmol/L — AB (ref 134–144)

## 2016-06-18 LAB — PROTIME-INR
INR: 1 (ref 0.8–1.2)
PROTHROMBIN TIME: 10.8 s (ref 9.1–12.0)

## 2016-06-21 ENCOUNTER — Other Ambulatory Visit: Payer: Self-pay

## 2016-06-21 ENCOUNTER — Ambulatory Visit (INDEPENDENT_AMBULATORY_CARE_PROVIDER_SITE_OTHER): Payer: Managed Care, Other (non HMO)

## 2016-06-21 DIAGNOSIS — R0602 Shortness of breath: Secondary | ICD-10-CM

## 2016-06-21 DIAGNOSIS — I10 Essential (primary) hypertension: Secondary | ICD-10-CM

## 2016-06-22 ENCOUNTER — Telehealth: Payer: Self-pay | Admitting: Internal Medicine

## 2016-06-22 NOTE — Telephone Encounter (Signed)
Reviewed instructions for procedure scheduled tomorrow. He verbalized understanding of all instructions and had no further questions at this time.

## 2016-06-23 ENCOUNTER — Telehealth: Payer: Self-pay | Admitting: *Deleted

## 2016-06-23 ENCOUNTER — Encounter: Payer: Self-pay | Admitting: *Deleted

## 2016-06-23 ENCOUNTER — Ambulatory Visit
Admission: RE | Admit: 2016-06-23 | Discharge: 2016-06-23 | Disposition: A | Discharge status: Home / Self Care | Payer: Managed Care, Other (non HMO) | Source: Ambulatory Visit | Attending: Internal Medicine | Admitting: Internal Medicine

## 2016-06-23 ENCOUNTER — Encounter
Admission: RE | Disposition: A | Discharge status: Home / Self Care | Payer: Self-pay | Source: Ambulatory Visit | Attending: Internal Medicine

## 2016-06-23 DIAGNOSIS — Z7951 Long term (current) use of inhaled steroids: Secondary | ICD-10-CM | POA: Insufficient documentation

## 2016-06-23 DIAGNOSIS — R9439 Abnormal result of other cardiovascular function study: Secondary | ICD-10-CM | POA: Diagnosis not present

## 2016-06-23 DIAGNOSIS — I10 Essential (primary) hypertension: Secondary | ICD-10-CM | POA: Diagnosis not present

## 2016-06-23 DIAGNOSIS — R0602 Shortness of breath: Secondary | ICD-10-CM | POA: Diagnosis not present

## 2016-06-23 DIAGNOSIS — Z79899 Other long term (current) drug therapy: Secondary | ICD-10-CM | POA: Diagnosis not present

## 2016-06-23 DIAGNOSIS — H409 Unspecified glaucoma: Secondary | ICD-10-CM | POA: Insufficient documentation

## 2016-06-23 DIAGNOSIS — Z6841 Body Mass Index (BMI) 40.0 and over, adult: Secondary | ICD-10-CM | POA: Diagnosis not present

## 2016-06-23 HISTORY — PX: CARDIAC CATHETERIZATION: SHX172

## 2016-06-23 SURGERY — LEFT HEART CATH AND CORONARY ANGIOGRAPHY
Anesthesia: Moderate Sedation | Laterality: Left

## 2016-06-23 MED ORDER — FENTANYL CITRATE (PF) 100 MCG/2ML IJ SOLN
INTRAMUSCULAR | Status: AC
  Filled 2016-06-23: qty 2

## 2016-06-23 MED ORDER — VERAPAMIL HCL 2.5 MG/ML IV SOLN
INTRAVENOUS | Status: AC
  Filled 2016-06-23: qty 2

## 2016-06-23 MED ORDER — SPIRONOLACTONE 25 MG PO TABS: 25.0000 mg | ORAL_TABLET | Freq: Every day | ORAL | Status: DC

## 2016-06-23 MED ORDER — SODIUM CHLORIDE 0.9 % WEIGHT BASED INFUSION: 1.0000 mL/kg/h | INTRAVENOUS | Status: DC

## 2016-06-23 MED ORDER — MIDAZOLAM HCL 2 MG/2ML IJ SOLN
INTRAMUSCULAR | Status: AC
  Filled 2016-06-23: qty 2

## 2016-06-23 MED ORDER — SODIUM CHLORIDE 0.9% FLUSH: 3.0000 mL | INTRAVENOUS | Status: DC | PRN

## 2016-06-23 MED ORDER — IOPAMIDOL (ISOVUE-300) INJECTION 61%
INTRAVENOUS | Status: DC | PRN
  Administered 2016-06-23: 90 mL via INTRA_ARTERIAL

## 2016-06-23 MED ORDER — ONDANSETRON HCL 4 MG/2ML IJ SOLN
INTRAMUSCULAR | Status: AC
  Administered 2016-06-23: 4 mg
  Filled 2016-06-23: qty 2

## 2016-06-23 MED ORDER — SODIUM CHLORIDE 0.9 % WEIGHT BASED INFUSION
3.0000 mL/kg/h | INTRAVENOUS | Status: AC
  Administered 2016-06-23: 3 mL/kg/h via INTRAVENOUS

## 2016-06-23 MED ORDER — HEPARIN (PORCINE) IN NACL 2-0.9 UNIT/ML-% IJ SOLN
INTRAMUSCULAR | Status: AC
  Filled 2016-06-23: qty 1000

## 2016-06-23 MED ORDER — MIDAZOLAM HCL 2 MG/2ML IJ SOLN
INTRAMUSCULAR | Status: DC | PRN
  Administered 2016-06-23 (×2): 1 mg via INTRAVENOUS

## 2016-06-23 MED ORDER — HEPARIN SODIUM (PORCINE) 1000 UNIT/ML IJ SOLN
INTRAMUSCULAR | Status: AC
  Filled 2016-06-23: qty 1

## 2016-06-23 MED ORDER — ASPIRIN 81 MG PO CHEW: 81.0000 mg | CHEWABLE_TABLET | ORAL | Status: DC

## 2016-06-23 MED ORDER — VERAPAMIL HCL 2.5 MG/ML IV SOLN
INTRAVENOUS | Status: DC | PRN
  Administered 2016-06-23: 2.5 mg via INTRA_ARTERIAL

## 2016-06-23 MED ORDER — FENTANYL CITRATE (PF) 100 MCG/2ML IJ SOLN
INTRAMUSCULAR | Status: DC | PRN
  Administered 2016-06-23: 50 ug via INTRAVENOUS

## 2016-06-23 MED ORDER — NEBIVOLOL HCL 20 MG PO TABS: 20.0000 mg | ORAL_TABLET | Freq: Every evening | ORAL | 5 refills | Status: DC

## 2016-06-23 MED ORDER — HEPARIN SODIUM (PORCINE) 1000 UNIT/ML IJ SOLN
INTRAMUSCULAR | Status: DC | PRN
  Administered 2016-06-23: 5000 [IU] via INTRAVENOUS

## 2016-06-23 MED ORDER — FUROSEMIDE 20 MG PO TABS: 20.0000 mg | ORAL_TABLET | Freq: Every day | ORAL | 0 refills | Status: DC

## 2016-06-23 MED ORDER — SODIUM CHLORIDE 0.9% FLUSH: 3.0000 mL | Freq: Two times a day (BID) | INTRAVENOUS | Status: DC

## 2016-06-23 MED ORDER — SODIUM CHLORIDE 0.9 % IV SOLN: 250.0000 mL | INTRAVENOUS | Status: DC | PRN

## 2016-06-23 SURGICAL SUPPLY — 8 items
CATH 5F 110X4 TIG (CATHETERS) ×2 IMPLANT
CATH INFINITI 5 FR AL2 (CATHETERS) ×2 IMPLANT
CATH INFINITI JR4 5F (CATHETERS) ×2 IMPLANT
DEVICE RAD COMP TR BAND LRG (VASCULAR PRODUCTS) ×2 IMPLANT
GLIDESHEATH SLEND SS 6F .021 (SHEATH) ×2 IMPLANT
KIT MANI 3VAL PERCEP (MISCELLANEOUS) ×2 IMPLANT
PACK CARDIAC CATH (CUSTOM PROCEDURE TRAY) ×2 IMPLANT
WIRE ROSEN-J .035X260CM (WIRE) ×2 IMPLANT

## 2016-06-23 NOTE — H&P (View-Only) (Signed)
Cardiology Office Note   Date:  05/25/2016   ID:  SHERMAN PERAULT, DOB 1961/01/06, MRN [id number]  Referring Doctor:  [name], MD   Cardiologist:   Wende Bushy, MD   Reason for consultation:  Chief Complaint  Patient presents with  . other    Sob and elevated BP. Meds reviewed verbally with pt.      History of Present Illness: Peter [name] is a 55 y.o. male who presents forShortness of breath. This has been going on for the last 2 months. Shortness of breath is with exertion, with minimal exertion. Shortness of breath is moderate intensity, mainly in chest, nonradiating. Resolves with rest.   He denies associated chest pain, palpitations, loss of consciousness.   ROS:  Please see the history of present illness. Aside from mentioned under HPI, all other systems are reviewed and negative.     Past Medical History:  Diagnosis Date  . Blood in stool   . Glaucoma    since 2004  . Hypertension    since 2000  . Obesity, unspecified   . Ulcer ([place]) 2011   gastric  . Unspecified hemorrhoids without mention of complication     Past Surgical History:  Procedure Laterality Date  . CHOLECYSTECTOMY  1992  . COLONOSCOPY W/ POLYPECTOMY  05/22/2010   70mmtransverse colon polyp, traditional serrated adenoma,negative for high grade dysplasia & malignancy. rectal polyp-18mm,negative for dysplasia & malignancy.  Marland Kitchen KNEE SURGERY Left 2010     reports that he has never smoked. He has never used smokeless tobacco. He reports that he drinks alcohol. He reports that he does not use drugs.   family history includes Cancer in his other; Hyperlipidemia in his mother; Hypertension in his mother.   Outpatient Medications Prior to Visit  Medication Sig Dispense Refill  . amLODipine (NORVASC) 10 MG tablet TAKE 1 TABLET BY MOUTH EVERY DAY 90 tablet 2  . ascorbic acid (C 500) 500 MG tablet C 500, 500MG  (Oral Tablet)  1 every day for 0 days  Quantity: 0.00;  Refills: 0   Ordered :27-Feb-2010  Ashley Royalty ;  Started 07-Aug-2009 Active    . BYSTOLIC 10 MG tablet TAKE 1 TABLET BY MOUTH EVERY DAY 30 tablet 12  . clobetasol ointment (TEMOVATE) 0.05 % as needed.     Marland Kitchen EQUETRO 200 MG CP12 12 hr capsule Takes 2 tablets am and 3 tablets pm qd.  7  . fluticasone (FLONASE) 50 MCG/ACT nasal spray Place 2 sprays into both nostrils daily. 16 g 6  . Garlic 123XX123 MG TABS Take 1 tablet by mouth 2 (two) times daily.    . hydrochlorothiazide (HYDRODIURIL) 25 MG tablet TAKE 1 TABLET BY MOUTH EVERY DAY 90 tablet 2  . lamoTRIgine (LAMICTAL) 200 MG tablet Take 400 mg by mouth daily.     Marland Kitchen losartan (COZAAR) 100 MG tablet TAKE 1 TABLET BY MOUTH EVERY DAY 90 tablet 2  . Magnesium Oxide 400 MG CAPS Take by mouth.    . OMEGA-3 FATTY ACIDS PO daily.     . potassium chloride (MICRO-K) 10 MEQ CR capsule TAKE 4 CAPSULES BY MOUTH EVERY DAY 120 capsule 12  . ranitidine (ZANTAC) 300 MG tablet TAKE 1 TABLET(300 MG) BY MOUTH TWICE DAILY 60 tablet 12  . RUTIN, MISC. NUTRITIONAL FACTORS, RUTIN, 500MG  (Oral Tablet)  1 every day for 0 days  Quantity: 0.00;  Refills: 0   Ordered :27-Feb-2010  Ashley Royalty ;  Started 07-Aug-2009 Active    .  sertraline (ZOLOFT) 100 MG tablet TAKE 1 TABLET BY MOUTH TWICE DAILY 180 tablet 2  . amoxicillin (AMOXIL) 875 MG tablet Take 1 tablet (875 mg total) by mouth 2 (two) times daily. (Patient not taking: Reported on 05/25/2016) 20 tablet 0   No facility-administered medications prior to visit.      Allergies: Patient has no known allergies.    PHYSICAL EXAM: VS:  BP (!) 158/100 (BP Location: Right Arm, Patient Position: Sitting, Cuff Size: Large)   Pulse 71   Ht 5\' 7"  (1.702 m)   Wt (!) 321 lb 8 oz (145.8 kg)   BMI 50.35 kg/m  , Body mass index is 50.35 kg/m. Wt Readings from Last 3 Encounters:  05/25/16 (!) 321 lb 8 oz (145.8 kg)  05/04/16 (!) 326 lb (147.9 kg)  04/28/16 (!) 329 lb (149.2 kg)    GENERAL:  well developed, well nourished, obese, not in  acute distress HEENT: normocephalic, pink conjunctivae, anicteric sclerae, no xanthelasma, normal dentition, oropharynx clear NECK:  no neck vein engorgement, JVP normal, no hepatojugular reflux, carotid upstroke brisk and symmetric, no bruit, no thyromegaly, no lymphadenopathy LUNGS:  good respiratory effort, clear to auscultation bilaterally CV:  PMI not displaced, no thrills, no lifts, S1 and S2 within normal limits, no palpable S3 or S4, no murmurs, no rubs, no gallops ABD:  Soft, nontender, nondistended, normoactive bowel sounds, no abdominal aortic bruit, no hepatomegaly, no splenomegaly MS: nontender back, no kyphosis, no scoliosis, no joint deformities EXT:  2+ DP/PT pulses, no edema, no varicosities, no cyanosis, no clubbing SKIN: warm, nondiaphoretic, normal turgor, no ulcers NEUROPSYCH: alert, oriented to person, place, and time, sensory/motor grossly intact, normal mood, appropriate affect  Recent Labs: 10/13/2015: Hemoglobin 15.6 05/04/2016: ALT 14; BUN 14; Creatinine, Ser 0.82; Platelets 228; Potassium 3.4; Sodium 143; TSH 3.200   Lipid Panel    Component Value Date/Time   CHOL 243 (H) 05/04/2016 1021   TRIG 193 (H) 05/04/2016 1021   HDL 75 05/04/2016 1021   LDLCALC 129 (H) 05/04/2016 1021     Other studies Reviewed:  EKG:  The ekg from 05/25/2016 was personally reviewed by me and it revealed sinus rhythm, 71 BPM.  Additional studies/ records that were reviewed personally reviewed by me today include: None available  ASSESSMENT AND PLAN:  Shortness breath Risk factors for CAD include age, hypertension, obesity Patient unable to walk on the treadmill due to knee issues. Recommend pharmacologic nuclear stress is. Recommend echocardiogram  Hypertension Per patient, blood pressure is in the 130s to 140s over 80s at home. Continue blood pressure log. Lifestyle changes recommended. Sodium restriction recommended.  Obesity Body mass index is 50.35 kg/m.Marland Kitchen Recommend  aggressive weight loss through diet and increased physical activity. Once cardiac workup is completed   Current medicines are reviewed at length with the patient today.  The patient does not have concerns regarding medicines.  Labs/ tests ordered today include:  Orders Placed This Encounter  Procedures  . NM Myocar Multi W/Spect W/Wall Motion / EF  . EKG 12-Lead  . ECHOCARDIOGRAM COMPLETE    I had a lengthy and detailed discussion with the patient regarding diagnoses, prognosis, diagnostic options, treatment options , and side effects of medications.   I counseled the patient on importance of lifestyle modification including heart healthy diet, regular physical activity Once cardiac workup is completed.   Disposition:   FU with undersigned after tests when necessary Signed, Wende Bushy, MD  05/25/2016 11:45 AM    [place]  [facility]  This note was generated in part with voice recognition software and I apologize for any typographical errors that were not detected and corrected.

## 2016-06-23 NOTE — Telephone Encounter (Signed)
-----   Message from Nelva Bush, MD sent at 06/23/2016  1:52 PM EST ----- Regarding: Repeat Labs Hi Pam,  Can you make arrangements for Mr. Schaver to have a BMP done on Friday, as I started him back on spironolactone 25 mg daily after his cath today?  We should have the labs drawn in such a way that the results come back before we close Friday afternoon, given the long weekend.  Thanks.  Gerald Stabs

## 2016-06-23 NOTE — Telephone Encounter (Signed)
Spoke with patient and instructed him to go to [employer] of the hospital on Friday Morning to have labs done so that Dr. Saunders Revel will get them before the end of the day. He verbalized understanding and had no further questions at this time.

## 2016-06-23 NOTE — Interval H&P Note (Signed)
History and Physical Interval Note:  06/23/2016 10:21 AM  [name]  has presented today for cardiac catheterization, with the diagnosis of shortness of breath, chest pain, and abnormal stress test. The various methods of treatment have been discussed with the patient and family. After consideration of risks, benefits and other options for treatment, the patient has consented to  Procedure(s): Left Heart Cath and Coronary Angiography (Left) as a surgical intervention .  The patient's history has been reviewed, patient examined, no change in status, stable for surgery.  I have reviewed the patient's chart and labs.  Questions were answered to the patient's satisfaction.  Patient presented for LHC last week and was noted to have significant hypokalemia.  Procedure was cancelled and repeat labs obtained after increased potassium supplementation.  Cath Lab Visit (complete for each Cath Lab visit)  Clinical Evaluation Leading to the Procedure:   ACS: No.  Non-ACS:    Anginal Classification: CCS III  Anti-ischemic medical therapy: Maximal Therapy (2 or more classes of medications)  Non-Invasive Test Results: High-risk stress test findings: cardiac mortality >3%/year  Prior CABG: No previous CABG   Bobetta Korf

## 2016-06-25 ENCOUNTER — Other Ambulatory Visit
Admission: RE | Admit: 2016-06-25 | Discharge: 2016-06-25 | Disposition: A | Discharge status: Home / Self Care | Payer: Managed Care, Other (non HMO) | Source: Ambulatory Visit | Attending: Internal Medicine | Admitting: Internal Medicine

## 2016-06-25 ENCOUNTER — Other Ambulatory Visit: Payer: Self-pay

## 2016-06-25 DIAGNOSIS — I10 Essential (primary) hypertension: Secondary | ICD-10-CM | POA: Diagnosis not present

## 2016-06-25 DIAGNOSIS — E876 Hypokalemia: Secondary | ICD-10-CM

## 2016-06-25 LAB — BASIC METABOLIC PANEL
Anion gap: 5 (ref 5–15)
BUN: 17 mg/dL (ref 6–20)
CALCIUM: 9 mg/dL (ref 8.9–10.3)
CHLORIDE: 107 mmol/L (ref 101–111)
CO2: 27 mmol/L (ref 22–32)
CREATININE: 0.87 mg/dL (ref 0.61–1.24)
GFR calc non Af Amer: >60 mL/min (ref >60)
Glucose, Bld: 101 mg/dL — ABNORMAL HIGH (ref 65–99)
Potassium: 3.9 mmol/L (ref 3.5–5.1)
Sodium: 139 mmol/L (ref 135–145)

## 2016-06-25 NOTE — Progress Notes (Unsigned)
bme

## 2016-07-01 ENCOUNTER — Telehealth: Payer: Self-pay | Admitting: Internal Medicine

## 2016-07-01 ENCOUNTER — Other Ambulatory Visit
Admission: RE | Admit: 2016-07-01 | Discharge: 2016-07-01 | Disposition: A | Discharge status: Home / Self Care | Payer: Managed Care, Other (non HMO) | Source: Ambulatory Visit | Attending: Internal Medicine | Admitting: Internal Medicine

## 2016-07-01 DIAGNOSIS — I10 Essential (primary) hypertension: Secondary | ICD-10-CM

## 2016-07-01 DIAGNOSIS — E876 Hypokalemia: Secondary | ICD-10-CM

## 2016-07-01 DIAGNOSIS — Z79899 Other long term (current) drug therapy: Secondary | ICD-10-CM

## 2016-07-01 LAB — BASIC METABOLIC PANEL
ANION GAP: 7 (ref 5–15)
BUN: 24 mg/dL — ABNORMAL HIGH (ref 6–20)
CALCIUM: 9 mg/dL (ref 8.9–10.3)
CO2: 28 mmol/L (ref 22–32)
Chloride: 105 mmol/L (ref 101–111)
Creatinine, Ser: 0.82 mg/dL (ref 0.61–1.24)
Glucose, Bld: 92 mg/dL (ref 65–99)
POTASSIUM: 3.2 mmol/L — AB (ref 3.5–5.1)
Sodium: 140 mmol/L (ref 135–145)

## 2016-07-01 MED ORDER — POTASSIUM CHLORIDE CRYS ER 20 MEQ PO TBCR: 40.0000 meq | EXTENDED_RELEASE_TABLET | Freq: Three times a day (TID) | ORAL | 4 refills | Status: DC

## 2016-07-01 NOTE — Telephone Encounter (Signed)
Patient returning call from [facility] re: lab results .  Please call.

## 2016-07-01 NOTE — Telephone Encounter (Signed)
Returned call to patient. Gave him the results as follows: Notes Recorded by Nelva Bush, MD on 07/01/2016 at 11:15 AM EST Please let Mr. Finkenbinder know that his potassium is low today at 3.2. He should increase potassium chloride to 40 meq TID and continue with his current dose of spironolactone. Please have him return for a recheck of potassium in 1 week. Thanks.  Patient verbalized understanding and wrote down the instructions. Repeat lab work ordered and patient knows to [facility] to [facility] in 1 week. Potassium Rx ordered.

## 2016-07-06 ENCOUNTER — Ambulatory Visit (INDEPENDENT_AMBULATORY_CARE_PROVIDER_SITE_OTHER): Payer: Managed Care, Other (non HMO) | Admitting: Cardiology

## 2016-07-06 ENCOUNTER — Encounter: Payer: Self-pay | Admitting: Cardiology

## 2016-07-06 VITALS — BP 162/102 | HR 61 | Ht 67.0 in | Wt 320.5 lb

## 2016-07-06 DIAGNOSIS — Z6841 Body Mass Index (BMI) 40.0 and over, adult: Secondary | ICD-10-CM | POA: Diagnosis not present

## 2016-07-06 DIAGNOSIS — I10 Essential (primary) hypertension: Secondary | ICD-10-CM

## 2016-07-06 DIAGNOSIS — E6609 Other obesity due to excess calories: Secondary | ICD-10-CM

## 2016-07-06 DIAGNOSIS — R0602 Shortness of breath: Secondary | ICD-10-CM | POA: Diagnosis not present

## 2016-07-06 DIAGNOSIS — IMO0001 Reserved for inherently not codable concepts without codable children: Secondary | ICD-10-CM

## 2016-07-06 NOTE — Patient Instructions (Addendum)
Medication Instructions:  Your physician has recommended you make the following change in your medication:  1. Discontinue Hydrochlorothiazide    Labwork: Your physician recommends that you return for lab work in: 1 week to have repeat labs done. Take slips to [facility] of the hospital first thing in the morning.    Follow-Up: Your physician recommends that you schedule a follow-up appointment in: 2 months with Dr. Yvone Neu.  Your physician recommends that you schedule a follow-up appointment with your primary care provider regarding your sleep.    It was a pleasure seeing you today here in the office. Please do not hesitate to give [place] a call back if you have any further questions. [place], BSN

## 2016-07-06 NOTE — Progress Notes (Signed)
Cardiology Office Note   Date:  07/06/2016   ID:  Peter [name], DOB Jan 01, 1961, MRN [id number]  Referring Doctor:  [name], MD   Cardiologist:   Wende Bushy, MD   Reason for consultation:  Chief Complaint  Patient presents with  . other     F/u Cath/angiogram, myoview, echo, stress. Pt c/o tiredness. Reviewed meds with pt verbally.      History of Present Illness: Peter [name] is a 56 y.o. male who presents for Follow-up after tests  Patient continues to have shortness of breath. Left heart catheterization shows no significant coronary artery disease. Main issue now is blood pressure control. There has been some changes to his medication regimen.  He does use his CPAP but has not followed up with a specialist for about 12 years now.  No chest pain. No palpitations. No syncope.   ROS:  Please see the history of present illness. Aside from mentioned under HPI, all other systems are reviewed and negative.     Past Medical History:  Diagnosis Date  . Blood in stool   . Glaucoma    since 2004  . Hypertension    since 2000  . Obesity, unspecified   . Ulcer ([place]) 2011   gastric  . Unspecified hemorrhoids without mention of complication     Past Surgical History:  Procedure Laterality Date  . CARDIAC CATHETERIZATION Left 06/23/2016   Procedure: Left Heart Cath and Coronary Angiography;  Surgeon: Nelva Bush, MD;  Location: [facility];  Service: Cardiovascular;  Laterality: Left;  . CHOLECYSTECTOMY  1992  . COLONOSCOPY W/ POLYPECTOMY  05/22/2010   32mmtransverse colon polyp, traditional serrated adenoma,negative for high grade dysplasia & malignancy. rectal polyp-4mm,negative for dysplasia & malignancy.  Marland Kitchen KNEE SURGERY Left 2010     reports that he has never smoked. He has never used smokeless tobacco. He reports that he drinks alcohol. He reports that he does not use drugs.   family history includes Cancer in his other;  Hyperlipidemia in his mother; Hypertension in his mother.   Outpatient Medications Prior to Visit  Medication Sig Dispense Refill  . amLODipine (NORVASC) 10 MG tablet TAKE 1 TABLET BY MOUTH EVERY DAY 90 tablet 2  . aspirin EC 81 MG tablet Take 81 mg by mouth daily.    Marland Kitchen atorvastatin (LIPITOR) 40 MG tablet Take 1 tablet (40 mg total) by mouth daily. (Patient taking differently: Take 40 mg by mouth every evening. ) 30 tablet 6  . clobetasol cream (TEMOVATE) [id number] % Apply 1 application topically at bedtime as needed.    Marland Kitchen EQUETRO 200 MG CP12 12 hr capsule Take 400-600 mg by mouth 2 (two) times daily. Take 400 mg in the morning & 600 mg in the evening.  7  . fluticasone (FLONASE) 50 MCG/ACT nasal spray Place 2 sprays into both nostrils daily. (Patient taking differently: Place 2 sprays into both nostrils daily as needed for allergies. ) 16 g 6  . Garlic 123XX123 MG TABS Take 100 mg by mouth 2 (two) times daily.     Marland Kitchen ibuprofen (ADVIL,MOTRIN) 200 MG tablet Take 800 mg by mouth 3 (three) times daily as needed for headache or moderate pain.    Marland Kitchen lamoTRIgine (LAMICTAL) 200 MG tablet Take 400 mg by mouth at bedtime.     Marland Kitchen latanoprost (XALATAN) 0.005 % ophthalmic solution Place 1 drop into both eyes daily.    Marland Kitchen losartan (COZAAR) 100 MG tablet TAKE 1 TABLET  BY MOUTH EVERY DAY 90 tablet 2  . MAGNESIUM-OXIDE 400 (241.3 Mg) MG tablet Take 400 mg by mouth 2 (two) times daily.  12  . Nebivolol HCl 20 MG TABS Take 1 tablet (20 mg total) by mouth every evening. 30 tablet 5  . OMEGA-3 FATTY ACIDS PO Take 1 capsule by mouth 2 (two) times daily.     . potassium chloride SA (K-DUR,KLOR-CON) 20 MEQ tablet Take 2 tablets (40 mEq total) by mouth 3 (three) times daily. 180 tablet 4  . ranitidine (ZANTAC) 300 MG tablet TAKE 1 TABLET(300 MG) BY MOUTH TWICE DAILY 60 tablet 12  . RUTIN, MISC. NUTRITIONAL FACTORS, TAKE 1 TABLET (500 MG) BY MOUTH DAILY    . sertraline (ZOLOFT) 100 MG tablet TAKE 1 TABLET BY MOUTH TWICE DAILY  (Patient taking differently: TAKE 2 TABLETS (200 MG) BY MOUTH DAILY) 180 tablet 2  . spironolactone (ALDACTONE) 25 MG tablet Take 1 tablet (25 mg total) by mouth daily.    . vitamin C (ASCORBIC ACID) 500 MG tablet Take 500 mg by mouth every evening.     . hydrochlorothiazide (HYDRODIURIL) 25 MG tablet TAKE 1 TABLET BY MOUTH EVERY DAY 90 tablet 2  . doxazosin (CARDURA) 8 MG tablet Take 0.5 tablets (4 mg total) by mouth daily. (Patient not taking: Reported on 07/06/2016) 30 tablet 6  . potassium chloride (K-DUR) 10 MEQ tablet Take 4 tablets (40 mEq) tonight, then 2 tablets (20 mEq) twice a day thereafter. (Patient not taking: Reported on 07/06/2016) 120 tablet 3   No facility-administered medications prior to visit.      Allergies: Patient has no known allergies.    PHYSICAL EXAM: VS:  BP (!) 162/102 (BP Location: Left Arm, Patient Position: Sitting, Cuff Size: Large)   Pulse 61   Ht 5\' 7"  (1.702 m)   Wt (!) 320 lb 8 oz (145.4 kg)   BMI 50.20 kg/m  , Body mass index is 50.2 kg/m. Wt Readings from Last 3 Encounters:  07/06/16 (!) 320 lb 8 oz (145.4 kg)  06/23/16 (!) 321 lb (145.6 kg)  06/15/16 (!) 330 lb (149.7 kg)    GENERAL:  well developed, well nourished, obese, not in acute distress HEENT: normocephalic, pink conjunctivae, anicteric sclerae, no xanthelasma, normal dentition, oropharynx clear NECK:  no neck vein engorgement, JVP normal, no hepatojugular reflux, carotid upstroke brisk and symmetric, no bruit, no thyromegaly, no lymphadenopathy LUNGS:  good respiratory effort, clear to auscultation bilaterally CV:  PMI not displaced, no thrills, no lifts, S1 and S2 within normal limits, no palpable S3 or S4, no murmurs, no rubs, no gallops ABD:  Soft, nontender, nondistended, normoactive bowel sounds, no abdominal aortic bruit, no hepatomegaly, no splenomegaly MS: nontender back, no kyphosis, no scoliosis, no joint deformities EXT:  2+ DP/PT pulses, no edema, no varicosities, no cyanosis,  no clubbing, radial artery to raise +2 bilateral, no significant hematoma on the right radial no bruit SKIN: warm, nondiaphoretic, normal turgor, no ulcers NEUROPSYCH: alert, oriented to person, place, and time, sensory/motor grossly intact, normal mood, appropriate affect  Recent Labs: 05/04/2016: ALT 14; TSH 3.200 06/15/2016: Hemoglobin 15.7 06/17/2016: Platelets 215 07/01/2016: BUN 24; Creatinine, Ser 0.82; Potassium 3.2; Sodium 140   Lipid Panel    Component Value Date/Time   CHOL 243 (H) 05/04/2016 1021   TRIG 193 (H) 05/04/2016 1021   HDL 75 05/04/2016 1021   LDLCALC 129 (H) 05/04/2016 1021     Other studies Reviewed:  EKG:  The ekg from 05/25/2016 was  personally reviewed by me and it revealed sinus rhythm, 71 BPM.  Additional studies/ records that were reviewed personally reviewed by me today include:   Nuclear stress test 06/10/2016:  Stress test demonstrated appropriate hemodynamic response.  No ST segment changes suggestive of ischemia  Defect 1: There is a small defect of moderate severity present in the basal anterior and mid anterior location. This appears partially reversible at rest on non-attenuation corrected images. This appears reversible at rest on attenuation corrected images.  Defect 2: There is a medium defect of moderate severity present in the basal inferior, basal inferolateral, mid inferior, mid inferolateral and apical inferior location. This appears partially reversible at rest on non-attenuation corrected images, reversible on attenuation corrected images.  Findings consistent with ischemia.  Nuclear stress EF: 50%.   Left heart cath 06/23/2016: 1. No angiographically significant coronary artery disease. 2. Mild to moderately elevated left ventricular filling pressure (LVEDP 20-25 mmHg)  Recommendations: 1. Patient's symptoms are most likely due to heart failure with preserved ejection fraction in the setting of uncontrolled  hypertension. 2. Increase nebivolol to 20 mg daily. 3. Start spironolactone 25 mg daily. 4. Follow-up with basic metabolic panel in two days to ensure stable renal function and potassium. 5. Recommend patient undergo workup for secondary hypertension, particularly hyperaldosteronism, given his persistently elevated blood pressure despite multiple antihypertensive medications. I will defer this to Dr. Yvone Neu.  Echo 06/21/2016: Left ventricle: The cavity size was normal. There was mild   concentric hypertrophy. Systolic function was normal. The   estimated ejection fraction was in the range of 55% to 60%. Wall   motion was normal; there were no regional wall motion   abnormalities. Left ventricular diastolic function parameters   were normal. - Pulmonary arteries: Systolic pressure could not be accurately   estimated.   ASSESSMENT AND PLAN:  Shortness breath Risk factors for CAD include age, hypertension, obesity Positive stress test, likely false positive due to patient's size Left heart catheterization revealed no significant coronary artery disease Continue risk factor modification  Hypertension Blood pressure log reviewed, blood pressure not at goal greater than 130/80 despite several antihypertensive medications. Also dealing with hypokalemia. We will discontinue HCTZ at this point. We'll proceed with workup for secondary causes: PAC/PRA, fractionated metanephrines. Patient will be checking BMP in a few days as well. If blood work unremarkable, will likely need to proceed with CTA of the abdomen to rule out for renal artery stenosis. Follow-up with PCP regarding seeing a sleep medicine specialist. Emphasized importance of lifestyle changes: Dietary modification, weight loss, increased physical activity to lose weight. Continue aspirin 81 mg by mouth daily.   Obesity Body mass index is 50.2 kg/m.Marland Kitchen Recommend aggressive weight loss through diet and increased physical activity.    Current medicines are reviewed at length with the patient today.  The patient does not have concerns regarding medicines.  Labs/ tests ordered today include:  Orders Placed This Encounter  Procedures  . Metanephrines, plasma  . Aldosterone + renin activity w/ ratio  . Basic metabolic panel    I had a lengthy and detailed discussion with the patient regarding diagnoses, prognosis, diagnostic options, treatment options , and side effects of medications.   I counseled the patient on importance of lifestyle modification including heart healthy diet, regular physical activity   Disposition:   FU with undersigned In 2 months   I spent at least 40 minutes with the patient today and more than 50% of the time was spent counseling the  patient and coordinating care.    Signed, Wende Bushy, MD  07/06/2016 2:05 PM    [place]  This note was generated in part with voice recognition software and I apologize for any typographical errors that were not detected and corrected.

## 2016-07-16 ENCOUNTER — Other Ambulatory Visit
Admission: RE | Admit: 2016-07-16 | Discharge: 2016-07-16 | Disposition: A | Discharge status: Home / Self Care | Payer: Managed Care, Other (non HMO) | Source: Ambulatory Visit | Attending: Cardiology | Admitting: Cardiology

## 2016-07-16 DIAGNOSIS — I10 Essential (primary) hypertension: Secondary | ICD-10-CM | POA: Diagnosis not present

## 2016-07-16 DIAGNOSIS — Z5181 Encounter for therapeutic drug level monitoring: Secondary | ICD-10-CM | POA: Diagnosis present

## 2016-07-16 DIAGNOSIS — Z79899 Other long term (current) drug therapy: Secondary | ICD-10-CM | POA: Insufficient documentation

## 2016-07-16 LAB — CARBAMAZEPINE LEVEL, TOTAL: Carbamazepine Lvl: 7.3 ug/mL (ref 4.0–12.0)

## 2016-07-16 LAB — BASIC METABOLIC PANEL
Anion gap: 7 (ref 5–15)
BUN: 24 mg/dL — AB (ref 6–20)
CALCIUM: 9.2 mg/dL (ref 8.9–10.3)
CO2: 25 mmol/L (ref 22–32)
CREATININE: 0.66 mg/dL (ref 0.61–1.24)
Chloride: 109 mmol/L (ref 101–111)
GFR calc non Af Amer: >60 mL/min (ref >60)
Glucose, Bld: 132 mg/dL — ABNORMAL HIGH (ref 65–99)
Potassium: 3.8 mmol/L (ref 3.5–5.1)
Sodium: 141 mmol/L (ref 135–145)

## 2016-07-20 LAB — METANEPHRINES, PLASMA
METANEPHRINE FREE: 13 pg/mL (ref 0–62)
NORMETANEPHRINE FREE: 113 pg/mL (ref 0–145)

## 2016-07-21 ENCOUNTER — Ambulatory Visit: Payer: Managed Care, Other (non HMO) | Admitting: Family Medicine

## 2016-07-21 LAB — ALDOSTERONE + RENIN ACTIVITY W/ RATIO: Aldosterone: 6.8 ng/dL (ref 0.0–30.0)

## 2016-07-28 ENCOUNTER — Ambulatory Visit (INDEPENDENT_AMBULATORY_CARE_PROVIDER_SITE_OTHER): Payer: Managed Care, Other (non HMO) | Admitting: Family Medicine

## 2016-07-28 VITALS — BP 162/94 | HR 80 | Temp 98.6°F | Resp 14 | Wt 326.0 lb

## 2016-07-28 DIAGNOSIS — S5011XA Contusion of right forearm, initial encounter: Secondary | ICD-10-CM

## 2016-07-28 DIAGNOSIS — E669 Obesity, unspecified: Secondary | ICD-10-CM | POA: Diagnosis not present

## 2016-07-28 DIAGNOSIS — I10 Essential (primary) hypertension: Secondary | ICD-10-CM

## 2016-07-28 DIAGNOSIS — R0602 Shortness of breath: Secondary | ICD-10-CM

## 2016-07-28 DIAGNOSIS — G4733 Obstructive sleep apnea (adult) (pediatric): Secondary | ICD-10-CM

## 2016-07-28 DIAGNOSIS — E269 Hyperaldosteronism, unspecified: Secondary | ICD-10-CM

## 2016-07-28 DIAGNOSIS — IMO0001 Reserved for inherently not codable concepts without codable children: Secondary | ICD-10-CM

## 2016-07-28 DIAGNOSIS — F3289 Other specified depressive episodes: Secondary | ICD-10-CM

## 2016-07-28 DIAGNOSIS — Z6841 Body Mass Index (BMI) 40.0 and over, adult: Secondary | ICD-10-CM

## 2016-07-28 DIAGNOSIS — E876 Hypokalemia: Secondary | ICD-10-CM

## 2016-07-28 MED ORDER — SPIRONOLACTONE 50 MG PO TABS: 50.0000 mg | ORAL_TABLET | Freq: Every day | ORAL | 12 refills | Status: DC

## 2016-07-28 NOTE — Progress Notes (Signed)
Peter [name]  MRN: [id number] DOB: 05-20-1961  Subjective:  HPI  Patient is here to follow up. Last office visit was on 05/04/16 for CPE. At that time routine labs were done-stable except for hypokalemia. At that time Dyspnea was discussed, EKG was checked and he was referred to Dr Yvone Neu. Saw Dr Yvone Neu 07/06/16 in her notes it was noted that the positive stress test may have been false positive. He had US of the heart done, angio gram and that showed no blockage. Dyspnea is the same not worse not better. He has follow up with Dr Yvone Neu scheduled. For hypokalemia she stopped his HCTZ. She advised patient to follow up with [place] and discuss re doing sleep study. He had this done years ago and has been wearing a CPAP machine. He does not feel rested in the morning when he wakes up, extra tired during the day. Wife states that when patient does not wear his CPAP he does snore. NO witnessed apnea or gasping for air. Epworth score today is a 10.  He also is having issue with his right lower arm. He states he fell about 2 weeks ago on that arm but did not start to have pain till about 2 days ago. Patient Active Problem List   Diagnosis Date Noted  . Shortness of breath 06/15/2016  . Abnormal stress test 06/15/2016  . 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 Avitaminosis D 03/25/2015  . Blood in stool   . Clinical depression 06/21/2011  . Cannot sleep 08/22/2009  . Hypercholesterolemia without hypertriglyceridemia 01/28/2009  . Hay fever 10/22/2008  . Adjustment disorder with mixed anxiety and depressed mood 12/12/2007  . Essential (primary) hypertension 12/12/2007  . Apnea, sleep 12/12/2007    Past Medical History:  Diagnosis Date  . Blood in stool   . Glaucoma    since 2004  . Hypertension    since 2000  . Obesity, unspecified   . Ulcer ([place]) 2011   gastric  . Unspecified hemorrhoids without mention of complication     Social History   Social History  . Marital status: Married   Spouse name: N/A  . Number of children: N/A  . Years of education: N/A   Occupational History  . Not on file.   Social History Main Topics  . Smoking status: Never Smoker  . Smokeless tobacco: Never Used  . Alcohol use Yes     Comment: occasionally  . Drug use: No  . Sexual activity: Not on file   Other Topics Concern  . Not on file   Social History Narrative  . No narrative on file    Outpatient Encounter Prescriptions as of 07/28/2016  Medication Sig Note  . amLODipine (NORVASC) 10 MG tablet TAKE 1 TABLET BY MOUTH EVERY DAY   . aspirin EC 81 MG tablet Take 81 mg by mouth daily.   Marland Kitchen atorvastatin (LIPITOR) 40 MG tablet Take 1 tablet (40 mg total) by mouth daily. (Patient taking differently: Take 40 mg by mouth every evening. )   . clobetasol cream (TEMOVATE) [id number] % Apply 1 application topically at bedtime as needed.   Marland Kitchen EQUETRO 200 MG CP12 12 hr capsule Take 400-600 mg by mouth 2 (two) times daily. Take 400 mg in the morning & 600 mg in the evening.   . fluticasone (FLONASE) 50 MCG/ACT nasal spray Place 2 sprays into both nostrils daily. (Patient taking differently: Place 2 sprays into both nostrils daily as needed for allergies. ) 06/11/2016: RARELY   .  Garlic 123XX123 MG TABS Take 100 mg by mouth 2 (two) times daily.    Marland Kitchen ibuprofen (ADVIL,MOTRIN) 200 MG tablet Take 800 mg by mouth 3 (three) times daily as needed for headache or moderate pain.   Marland Kitchen lamoTRIgine (LAMICTAL) 200 MG tablet Take 400 mg by mouth at bedtime.    Marland Kitchen latanoprost (XALATAN) 0.005 % ophthalmic solution Place 1 drop into both eyes daily.   Marland Kitchen losartan (COZAAR) 100 MG tablet TAKE 1 TABLET BY MOUTH EVERY DAY   . MAGNESIUM-OXIDE 400 (241.3 Mg) MG tablet Take 400 mg by mouth 2 (two) times daily.   . Nebivolol HCl 20 MG TABS Take 1 tablet (20 mg total) by mouth every evening.   Marland Kitchen OMEGA-3 FATTY ACIDS PO Take 1 capsule by mouth 2 (two) times daily.    . potassium chloride SA (K-DUR,KLOR-CON) 20 MEQ tablet Take 2 tablets (40  mEq total) by mouth 3 (three) times daily.   . ranitidine (ZANTAC) 300 MG tablet TAKE 1 TABLET(300 MG) BY MOUTH TWICE DAILY   . RUTIN, MISC. NUTRITIONAL FACTORS, TAKE 1 TABLET (500 MG) BY MOUTH DAILY   . sertraline (ZOLOFT) 100 MG tablet TAKE 1 TABLET BY MOUTH TWICE DAILY (Patient taking differently: TAKE 2 TABLETS (200 MG) BY MOUTH DAILY)   . spironolactone (ALDACTONE) 25 MG tablet Take 1 tablet (25 mg total) by mouth daily.   . vitamin C (ASCORBIC ACID) 500 MG tablet Take 500 mg by mouth every evening.     No facility-administered encounter medications on file as of 07/28/2016.     Allergies  Allergen Reactions  . Isosorbide     Headache, nausea, vomiting    Review of Systems  Constitutional: Positive for malaise/fatigue (tired during the day, does not feel refreshed in the morning.).  Eyes: Negative.   Respiratory: Positive for shortness of breath.        Snores when not wearing his CPAP machine  Cardiovascular: Negative for chest pain and palpitations.  Gastrointestinal: Negative.   Musculoskeletal: Positive for falls (2 weeks ago-around first of January 2018.) and myalgias (right lower arm pain, swelling.).  Skin: Positive for rash.  Neurological: Negative.   Endo/Heme/Allergies: Negative.   Psychiatric/Behavioral: Negative.     Objective:  BP (!) 162/94   Pulse 80   Temp 98.6 F (37 C)   Resp 14   Wt (!) 326 lb (147.9 kg)   BMI 51.06 kg/m   Physical Exam  Constitutional: He is oriented to person, place, and time and well-developed, well-nourished, and in no distress.  HENT:  Head: Normocephalic and atraumatic.  Right Ear: External ear normal.  Left Ear: External ear normal.  Nose: Nose normal.  Eyes: Conjunctivae are normal. Pupils are equal, round, and reactive to light. No scleral icterus.  Neck: Normal range of motion. Neck supple. No thyromegaly present.  Cardiovascular: Normal rate, regular rhythm, normal heart sounds and intact distal pulses.   No murmur  heard. Pulmonary/Chest: Effort normal and breath sounds normal. No respiratory distress. He has no wheezes.  Abdominal: Soft.  Musculoskeletal:  Hematoma of the right distal forearm, elbow is normal.  Neurological: He is alert and oriented to person, place, and time.  Skin: Skin is warm and dry.  Psychiatric: Mood, memory, affect and judgment normal.    Assessment and Plan :  1. Shortness of breath The same. Had cardiac work up. Has follow up with Dr Yvone Neu scheduled.  2. Class 3 obesity with body mass index (BMI) of 50.0 to 59.9 in  adult, unspecified obesity type, unspecified whether serious comorbidity present [facility]) Work on habits. I think if this improves his other symptoms will improve. Will [facility] to [facility] counseling.  3. Traumatic hematoma of right forearm, initial encounter Due to a fall. ROM is normal. Elbow is normal on the exam. Advised patient to use heat to the area. I do not think this needs further imaging. Follow as needed.  4. Essential (primary) hypertension Still elevated. I think loosing weight will help his b/p.  5. Obstructive sleep apnea syndrome Will re order sleep study-split sleep study. Last test was around 10 years ago. He is wearing CPAP machine. Epworth score today is 10. Order test and follow.  6. Other depression Stable. 7. Hypokalemia Resolved off HCTZ on 07/16/16 lab check. Will follow. Re check Renal panel on the next visit. Refer to endocrinology for evaluation for possible hyperaldosteronism anemia  HPI, Exam and A&P transcribed under direction and in the presence of Miguel Aschoff, MD. I have done the exam and reviewed the chart and it is accurate to the best of my knowledge. Development worker, community has been used and  any errors in dictation or transcription are unintentional. Miguel Aschoff M.D. [facility]

## 2016-08-06 ENCOUNTER — Other Ambulatory Visit: Payer: Self-pay | Admitting: Nurse Practitioner

## 2016-08-06 ENCOUNTER — Telehealth: Payer: BLUE CROSS/BLUE SHIELD | Admitting: Nurse Practitioner

## 2016-08-06 DIAGNOSIS — J0100 Acute maxillary sinusitis, unspecified: Secondary | ICD-10-CM

## 2016-08-06 MED ORDER — LEVOFLOXACIN 500 MG PO TABS: 500.0000 mg | ORAL_TABLET | Freq: Every day | ORAL | 0 refills | Status: DC

## 2016-08-06 NOTE — Addendum Note (Signed)
Addended by: Chevis Pretty on: 08/06/2016 10:09 AM   Modules accepted: Orders

## 2016-08-06 NOTE — Progress Notes (Signed)

## 2016-08-26 ENCOUNTER — Ambulatory Visit: Payer: Managed Care, Other (non HMO) | Admitting: Family Medicine

## 2016-08-30 ENCOUNTER — Ambulatory Visit: Payer: Managed Care, Other (non HMO) | Admitting: Family Medicine

## 2016-09-09 ENCOUNTER — Encounter: Payer: Self-pay | Admitting: Cardiology

## 2016-09-09 ENCOUNTER — Ambulatory Visit (INDEPENDENT_AMBULATORY_CARE_PROVIDER_SITE_OTHER): Payer: 59 | Admitting: Cardiology

## 2016-09-09 VITALS — BP 166/92 | HR 65 | Ht 67.0 in | Wt 305.5 lb

## 2016-09-09 DIAGNOSIS — Z6841 Body Mass Index (BMI) 40.0 and over, adult: Secondary | ICD-10-CM

## 2016-09-09 DIAGNOSIS — E6609 Other obesity due to excess calories: Secondary | ICD-10-CM

## 2016-09-09 DIAGNOSIS — I159 Secondary hypertension, unspecified: Secondary | ICD-10-CM | POA: Diagnosis not present

## 2016-09-09 DIAGNOSIS — IMO0001 Reserved for inherently not codable concepts without codable children: Secondary | ICD-10-CM

## 2016-09-09 MED ORDER — HYDRALAZINE HCL 25 MG PO TABS: 25.0000 mg | ORAL_TABLET | Freq: Three times a day (TID) | ORAL | 1 refills | Status: DC

## 2016-09-09 NOTE — Patient Instructions (Signed)
Medication Instructions:  Your physician has recommended you make the following change in your medication:  1. START Hydralazine 25 mg three times a day.  Continue to monitor your blood pressures and keep a log so that we can see how they trend.    Follow-Up: Your physician recommends that you schedule a follow-up appointment in: 2 months with Dr. Yvone Neu.   It was a pleasure seeing you today here in the office. Please do not hesitate to give [place] a call back if you have any further questions. [place], BSN

## 2016-09-09 NOTE — Progress Notes (Signed)
Cardiology Office Note   Date:  09/09/2016   ID:  Peter [name], DOB 1960-12-16, MRN [id number]  Referring Doctor:  [name], MD   Cardiologist:   Wende Bushy, MD   Reason for consultation:  Chief Complaint  Patient presents with  . other    39mo f/u. Pt states he is doing well other than high BP. Reviewed meds with pt verbally.      History of Present Illness: Peter [name] is a 56 y.o. male who presents forFollow-up for hypertension.  Since last visit, patient has been doing overall well. No chest pain. Shortness of breath is at baseline. Blood pressure remains elevated despite taking all of his blood pressure medications.  He has a pending endocrine physical it on March 16 after abnormalities noted on PA-C, PRA.  He is working with his PCP regarding getting this sleep study approved.  ROS:  Please see the history of present illness. Aside from mentioned under HPI, all other systems are reviewed and negative.    Past Medical History:  Diagnosis Date  . Blood in stool   . Glaucoma    since 2004  . Hypertension    since 2000  . Obesity, unspecified   . Ulcer ([place]) 2011   gastric  . Unspecified hemorrhoids without mention of complication     Past Surgical History:  Procedure Laterality Date  . CARDIAC CATHETERIZATION Left 06/23/2016   Procedure: Left Heart Cath and Coronary Angiography;  Surgeon: Nelva Bush, MD;  Location: [facility];  Service: Cardiovascular;  Laterality: Left;  . CHOLECYSTECTOMY  1992  . COLONOSCOPY W/ POLYPECTOMY  05/22/2010   36mmtransverse colon polyp, traditional serrated adenoma,negative for high grade dysplasia & malignancy. rectal polyp-70mm,negative for dysplasia & malignancy.  Marland Kitchen KNEE SURGERY Left 2010     reports that he has never smoked. He has never used smokeless tobacco. He reports that he drinks alcohol. He reports that he does not use drugs.   family history includes Cancer in his other;  Hyperlipidemia in his mother; Hypertension in his mother.   Outpatient Medications Prior to Visit  Medication Sig Dispense Refill  . amLODipine (NORVASC) 10 MG tablet TAKE 1 TABLET BY MOUTH EVERY DAY 90 tablet 2  . aspirin EC 81 MG tablet Take 81 mg by mouth daily.    Marland Kitchen atorvastatin (LIPITOR) 40 MG tablet Take 1 tablet (40 mg total) by mouth daily. (Patient taking differently: Take 40 mg by mouth every evening. ) 30 tablet 6  . clobetasol cream (TEMOVATE) 6.38 % Apply 1 application topically at bedtime as needed.    Marland Kitchen EQUETRO 200 MG CP12 12 hr capsule Take 400-600 mg by mouth 2 (two) times daily. Take 400 mg in the morning & 600 mg in the evening.  7  . fluticasone (FLONASE) 50 MCG/ACT nasal spray Place 2 sprays into both nostrils daily. (Patient taking differently: Place 2 sprays into both nostrils daily as needed for allergies. ) 16 g 6  . Garlic 756 MG TABS Take 100 mg by mouth 2 (two) times daily.     Marland Kitchen ibuprofen (ADVIL,MOTRIN) 200 MG tablet Take 800 mg by mouth 3 (three) times daily as needed for headache or moderate pain.    Marland Kitchen lamoTRIgine (LAMICTAL) 200 MG tablet Take 400 mg by mouth at bedtime.     Marland Kitchen latanoprost (XALATAN) 0.005 % ophthalmic solution Place 1 drop into both eyes daily.    Marland Kitchen losartan (COZAAR) 100 MG tablet  TAKE 1 TABLET BY MOUTH EVERY DAY 90 tablet 2  . MAGNESIUM-OXIDE 400 (241.3 Mg) MG tablet Take 400 mg by mouth 2 (two) times daily.  12  . Nebivolol HCl 20 MG TABS Take 1 tablet (20 mg total) by mouth every evening. 30 tablet 5  . OMEGA-3 FATTY ACIDS PO Take 1 capsule by mouth 2 (two) times daily.     . potassium chloride SA (K-DUR,KLOR-CON) 20 MEQ tablet Take 2 tablets (40 mEq total) by mouth 3 (three) times daily. 180 tablet 4  . ranitidine (ZANTAC) 300 MG tablet TAKE 1 TABLET(300 MG) BY MOUTH TWICE DAILY 60 tablet 12  . RUTIN, MISC. NUTRITIONAL FACTORS, TAKE 1 TABLET (500 MG) BY MOUTH DAILY    . sertraline (ZOLOFT) 100 MG tablet TAKE 1 TABLET BY MOUTH TWICE DAILY  (Patient taking differently: TAKE 2 TABLETS (200 MG) BY MOUTH DAILY) 180 tablet 2  . spironolactone (ALDACTONE) 50 MG tablet Take 1 tablet (50 mg total) by mouth daily. 30 tablet 12  . vitamin C (ASCORBIC ACID) 500 MG tablet Take 500 mg by mouth every evening.     Marland Kitchen levofloxacin (LEVAQUIN) 500 MG tablet Take 1 tablet (500 mg total) by mouth daily. (Patient not taking: Reported on 09/09/2016) 7 tablet 0   No facility-administered medications prior to visit.      Allergies: Isosorbide    PHYSICAL EXAM: VS:  BP (!) 166/92 (BP Location: Left Arm, Patient Position: Sitting, Cuff Size: Large)   Pulse 65   Ht 5\' 7"  (1.702 m)   Wt (!) 305 lb 8 oz (138.6 kg)   BMI 47.85 kg/m  , Body mass index is 47.85 kg/m. Wt Readings from Last 3 Encounters:  09/09/16 (!) 305 lb 8 oz (138.6 kg)  07/28/16 (!) 326 lb (147.9 kg)  07/06/16 (!) 320 lb 8 oz (145.4 kg)    GENERAL:  well developed, well nourished, obese, not in acute distress HEENT: normocephalic, pink conjunctivae, anicteric sclerae, no xanthelasma, normal dentition, oropharynx clear NECK:  no neck vein engorgement, JVP normal, no hepatojugular reflux, carotid upstroke brisk and symmetric, no bruit, no thyromegaly, no lymphadenopathy LUNGS:  good respiratory effort, clear to auscultation bilaterally CV:  PMI not displaced, no thrills, no lifts, S1 and S2 within normal limits, no palpable S3 or S4, no murmurs, no rubs, no gallops ABD:  Soft, nontender, nondistended, normoactive bowel sounds, no abdominal aortic bruit, no hepatomegaly, no splenomegaly MS: nontender back, no kyphosis, no scoliosis, no joint deformities EXT:  2+ DP/PT pulses, no edema, no varicosities, no cyanosis, no clubbing SKIN: warm, nondiaphoretic, normal turgor, no ulcers NEUROPSYCH: alert, oriented to person, place, and time, sensory/motor grossly intact, normal mood, appropriate affect    Recent Labs: 05/04/2016: ALT 14; TSH 3.200 06/15/2016: Hemoglobin 15.7 06/17/2016:  Platelets 215 07/16/2016: BUN 24; Creatinine, Ser 0.66; Potassium 3.8; Sodium 141   Lipid Panel    Component Value Date/Time   CHOL 243 (H) 05/04/2016 1021   TRIG 193 (H) 05/04/2016 1021   HDL 75 05/04/2016 1021   LDLCALC 129 (H) 05/04/2016 1021     Other studies Reviewed:  EKG:  The ekg from 05/25/2016 was personally reviewed by me and it revealed sinus rhythm, 71 BPM.  Additional studies/ records that were reviewed personally reviewed by me today include:   Nuclear stress test 06/10/2016:  Stress test demonstrated appropriate hemodynamic response.  No ST segment changes suggestive of ischemia  Defect 1: There is a small defect of moderate severity present in the basal  anterior and mid anterior location. This appears partially reversible at rest on non-attenuation corrected images. This appears reversible at rest on attenuation corrected images.  Defect 2: There is a medium defect of moderate severity present in the basal inferior, basal inferolateral, mid inferior, mid inferolateral and apical inferior location. This appears partially reversible at rest on non-attenuation corrected images, reversible on attenuation corrected images.  Findings consistent with ischemia.  Nuclear stress EF: 50%.   Left heart cath 06/23/2016: 1. No angiographically significant coronary artery disease. 2. Mild to moderately elevated left ventricular filling pressure (LVEDP 20-25 mmHg)  Recommendations: 1. Patient's symptoms are most likely due to heart failure with preserved ejection fraction in the setting of uncontrolled hypertension. 2. Increase nebivolol to 20 mg daily. 3. Start spironolactone 25 mg daily. 4. Follow-up with basic metabolic panel in two days to ensure stable renal function and potassium. 5. Recommend patient undergo workup for secondary hypertension, particularly hyperaldosteronism, given his persistently elevated blood pressure despite multiple antihypertensive  medications. I will defer this to Dr. Yvone Neu.  Echo 06/21/2016: Left ventricle: The cavity size was normal. There was mild   concentric hypertrophy. Systolic function was normal. The   estimated ejection fraction was in the range of 55% to 60%. Wall   motion was normal; there were no regional wall motion   abnormalities. Left ventricular diastolic function parameters   were normal. - Pulmonary arteries: Systolic pressure could not be accurately   estimated.   ASSESSMENT AND PLAN: Hypertension, possibly secondary Still elevated despite being on maximum doses of amlodipine, losartan, nebivolol and also on aldactone.  Will try hydralazine 25 mg by mouth 3 times a day.  Hydrochlorothiazide was discontinued because of hypokalemia.  He will be seeing endocrine for abnormal PA-C/PRA ratio, significant hypokalemia requiring an ARB, Aldactone, and potassium supplementation.  PAC/PRA - abnormal, reco endocrine eval fractionated metanephrines - wnl Continue to follow-up with PCP regarding seeing a sleep medicine specialist. Emphasized importance of lifestyle changes: Dietary modification, weight loss, increased physical activity to lose weight. Continue aspirin 81 mg by mouth daily.   Obesity Body mass index is 47.85 kg/m.Marland Kitchen Recommend aggressive weight loss through diet and increased physical activity. Amended on weight loss.   Current medicines are reviewed at length with the patient today.  The patient does not have concerns regarding medicines.  Labs/ tests ordered today include:  No orders of the defined types were placed in this encounter.   I had a lengthy and detailed discussion with the patient regarding diagnoses, prognosis, diagnostic options, treatment options , and side effects of medications.   I counseled the patient on importance of lifestyle modification including heart healthy diet, regular physical activity   Disposition:   FU with undersigned In 2 months    Signed, Wende Bushy, MD  09/09/2016 9:30 AM    [place]  This note was generated in part with voice recognition software and I apologize for any typographical errors that were not detected and corrected.

## 2016-09-21 ENCOUNTER — Ambulatory Visit (INDEPENDENT_AMBULATORY_CARE_PROVIDER_SITE_OTHER): Payer: 59 | Admitting: Family Medicine

## 2016-09-21 ENCOUNTER — Ambulatory Visit: Payer: Managed Care, Other (non HMO) | Admitting: Family Medicine

## 2016-09-21 VITALS — BP 148/88 | HR 72 | Temp 98.3°F | Resp 14 | Wt 322.0 lb

## 2016-09-21 DIAGNOSIS — I10 Essential (primary) hypertension: Secondary | ICD-10-CM | POA: Diagnosis not present

## 2016-09-21 DIAGNOSIS — E669 Obesity, unspecified: Secondary | ICD-10-CM | POA: Diagnosis not present

## 2016-09-21 DIAGNOSIS — Z6841 Body Mass Index (BMI) 40.0 and over, adult: Secondary | ICD-10-CM

## 2016-09-21 DIAGNOSIS — R5383 Other fatigue: Secondary | ICD-10-CM

## 2016-09-21 DIAGNOSIS — E269 Hyperaldosteronism, unspecified: Secondary | ICD-10-CM

## 2016-09-21 DIAGNOSIS — R6882 Decreased libido: Secondary | ICD-10-CM

## 2016-09-21 DIAGNOSIS — G4733 Obstructive sleep apnea (adult) (pediatric): Secondary | ICD-10-CM

## 2016-09-21 DIAGNOSIS — E876 Hypokalemia: Secondary | ICD-10-CM

## 2016-09-21 DIAGNOSIS — R0602 Shortness of breath: Secondary | ICD-10-CM

## 2016-09-21 DIAGNOSIS — IMO0001 Reserved for inherently not codable concepts without codable children: Secondary | ICD-10-CM

## 2016-09-21 NOTE — Progress Notes (Signed)
Peter [name]  MRN: [id number] DOB: 1960/09/09  Subjective:  HPI  Patient is here for follow up. LOV was 07/28/16. B/Pwas elevated on the last visit and we were going to follow at that time. Patient has been checking his b/p and readings have been around 150s/100s. No cardiac symptoms. Shortness of breath present and the same. BP Readings from Last 3 Encounters:  09/21/16 (!) 148/88  09/09/16 (!) 166/92  07/28/16 (!) 162/94   Wt Readings from Last 3 Encounters:  09/21/16 (!) 322 lb (146.1 kg)  09/09/16 (!) 305 lb 8 oz (138.6 kg)  07/28/16 (!) 326 lb (147.9 kg)   Also it was noted on the last visit to re check Renal panel today. We ordered sleep study last time and insurance did not approve it until last week so he has study scheduled for April 5th. He saw endocrinologist since his last visit and was taking off Spironolactone for now, they will check Aldosterone and Renin level and need patient to be off the medication for 6 weeks.  BMP 07/16/16, CBC 06/17/16, Cmet, TSH and Lipid on 05/04/16.  Patient Active Problem List   Diagnosis Date Noted  . Shortness of breath 06/15/2016  . Abnormal stress test 06/15/2016  . 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 Avitaminosis D 03/25/2015  . Blood in stool   . Clinical depression 06/21/2011  . Cannot sleep 08/22/2009  . Hypercholesterolemia without hypertriglyceridemia 01/28/2009  . Hay fever 10/22/2008  . Adjustment disorder with mixed anxiety and depressed mood 12/12/2007  . Essential (primary) hypertension 12/12/2007  . Apnea, sleep 12/12/2007    Past Medical History:  Diagnosis Date  . Blood in stool   . Glaucoma    since 2004  . Hypertension    since 2000  . Obesity, unspecified   . Ulcer ([place]) 2011   gastric  . Unspecified hemorrhoids without mention of complication     Social History   Social History  . Marital status: Married    Spouse name: N/A  . Number of children: N/A  . Years of education: N/A    Occupational History  . Not on file.   Social History Main Topics  . Smoking status: Never Smoker  . Smokeless tobacco: Never Used  . Alcohol use Yes     Comment: occasionally  . Drug use: No  . Sexual activity: Not on file   Other Topics Concern  . Not on file   Social History Narrative  . No narrative on file    Outpatient Encounter Prescriptions as of 09/21/2016  Medication Sig Note  . amLODipine (NORVASC) 10 MG tablet TAKE 1 TABLET BY MOUTH EVERY DAY   . aspirin EC 81 MG tablet Take 81 mg by mouth daily.   . clobetasol cream (TEMOVATE) 3.14 % Apply 1 application topically at bedtime as needed.   Marland Kitchen EQUETRO 200 MG CP12 12 hr capsule Take 400-600 mg by mouth 2 (two) times daily. Take 400 mg in the morning & 600 mg in the evening.   . fluticasone (FLONASE) 50 MCG/ACT nasal spray Place 2 sprays into both nostrils daily. (Patient taking differently: Place 2 sprays into both nostrils daily as needed for allergies. ) 06/11/2016: RARELY   . Garlic 970 MG TABS Take 100 mg by mouth 2 (two) times daily.    . hydrALAZINE (APRESOLINE) 25 MG tablet Take 1 tablet (25 mg total) by mouth 3 (three) times daily.   Marland Kitchen ibuprofen (ADVIL,MOTRIN) 200 MG tablet Take 800 mg by mouth  3 (three) times daily as needed for headache or moderate pain.   Marland Kitchen lamoTRIgine (LAMICTAL) 200 MG tablet Take 400 mg by mouth at bedtime.    Marland Kitchen latanoprost (XALATAN) 0.005 % ophthalmic solution Place 1 drop into both eyes daily.   Marland Kitchen losartan (COZAAR) 100 MG tablet TAKE 1 TABLET BY MOUTH EVERY DAY   . MAGNESIUM-OXIDE 400 (241.3 Mg) MG tablet Take 400 mg by mouth 2 (two) times daily.   . Nebivolol HCl 20 MG TABS Take 1 tablet (20 mg total) by mouth every evening.   Marland Kitchen OMEGA-3 FATTY ACIDS PO Take 1 capsule by mouth 2 (two) times daily.    . potassium chloride SA (K-DUR,KLOR-CON) 20 MEQ tablet Take 2 tablets (40 mEq total) by mouth 3 (three) times daily.   . ranitidine (ZANTAC) 300 MG tablet TAKE 1 TABLET(300 MG) BY MOUTH TWICE  DAILY   . RUTIN, MISC. NUTRITIONAL FACTORS, TAKE 1 TABLET (500 MG) BY MOUTH DAILY   . sertraline (ZOLOFT) 100 MG tablet TAKE 1 TABLET BY MOUTH TWICE DAILY (Patient taking differently: TAKE 2 TABLETS (200 MG) BY MOUTH DAILY)   . vitamin C (ASCORBIC ACID) 500 MG tablet Take 500 mg by mouth every evening.    Marland Kitchen atorvastatin (LIPITOR) 40 MG tablet Take 1 tablet (40 mg total) by mouth daily. (Patient taking differently: Take 40 mg by mouth every evening. )   . spironolactone (ALDACTONE) 50 MG tablet Take 1 tablet (50 mg total) by mouth daily. (Patient not taking: Reported on 09/21/2016)    No facility-administered encounter medications on file as of 09/21/2016.     Allergies  Allergen Reactions  . Isosorbide     Headache, nausea, vomiting    Review of Systems  Constitutional: Positive for malaise/fatigue (worsening.).  Respiratory: Positive for shortness of breath (chronic).   Cardiovascular: Negative.   Gastrointestinal: Negative.   Genitourinary:       Decreased libido  Musculoskeletal: Positive for joint pain (knees).  Neurological: Negative.   Endo/Heme/Allergies: Negative.   Psychiatric/Behavioral: Negative.     Objective:  BP (!) 148/88   Pulse 72   Temp 98.3 F (36.8 C)   Resp 14   Wt (!) 322 lb (146.1 kg)   BMI 50.43 kg/m   Physical Exam  Constitutional: He is oriented to person, place, and time and well-developed, well-nourished, and in no distress.  HENT:  Head: Normocephalic and atraumatic.  Eyes: Conjunctivae are normal. Pupils are equal, round, and reactive to light.  Neck: Normal range of motion. Neck supple.  Cardiovascular: Normal rate, regular rhythm, normal heart sounds and intact distal pulses.   No murmur heard. Pulmonary/Chest: Effort normal and breath sounds normal. No respiratory distress. He has no wheezes.  Abdominal: Soft.  Neurological: He is alert and oriented to person, place, and time.  Skin: Skin is warm and dry.  Psychiatric: Mood, memory,  affect and judgment normal.   Assessment and Plan :  1. Essential (primary) hypertension Better but still elevated. Advised patient to follow through with the course through endocrinology.  2. Class 3 obesity with body mass index (BMI) of 50.0 to 59.9 in adult, unspecified obesity type, unspecified whether serious comorbidity present ([place])  3. Shortness of breath Stable. Same.  4. Obstructive sleep apnea syndrome Has sleep study set up for 10/07/16.  5. Hypokalemia 6. Hyperaldosteronism ([place]) Follow through with endocrinology. Will fax result of angiography of renal artery from 2007 for her to review, may need to have this re evaluated. 7. Fatigue Check  labs. Pending results. May need prolactin level checked if Testosterone level is low. Consider Clomid vs Androgel.  Re check in 2 months on fatigue, HTN. HPI, Exam and A&P transcribed under direction and in the presence of Miguel Aschoff, MD. I have done the exam and reviewed the chart and it is accurate to the best of my knowledge. Development worker, community has been used and  any errors in dictation or transcription are unintentional. Miguel Aschoff M.D. [facility]

## 2016-10-02 ENCOUNTER — Other Ambulatory Visit: Payer: Self-pay | Admitting: Cardiology

## 2016-10-08 ENCOUNTER — Ambulatory Visit: Payer: 59 | Attending: Otolaryngology

## 2016-10-08 LAB — TESTOSTERONE: TESTOSTERONE: 224 ng/dL — AB (ref 264–916)

## 2016-10-08 LAB — VITAMIN D 25 HYDROXY (VIT D DEFICIENCY, FRACTURES): Vit D, 25-Hydroxy: 14.9 ng/mL — ABNORMAL LOW (ref 30.0–100.0)

## 2016-10-13 ENCOUNTER — Other Ambulatory Visit: Payer: Self-pay

## 2016-10-13 MED ORDER — VITAMIN D (ERGOCALCIFEROL) 1.25 MG (50000 UNIT) PO CAPS: 50000.0000 [IU] | ORAL_CAPSULE | ORAL | 0 refills | Status: DC

## 2016-10-13 NOTE — Progress Notes (Signed)
Advised  ED 

## 2016-10-13 NOTE — Progress Notes (Unsigned)
d 

## 2016-10-30 ENCOUNTER — Inpatient Hospital Stay
Admission: EM | Admit: 2016-10-30 | Discharge: 2016-11-07 | DRG: 339 | Disposition: A | Discharge status: Home Health | Payer: 59 | Attending: Surgery | Admitting: Surgery

## 2016-10-30 ENCOUNTER — Emergency Department: Payer: 59

## 2016-10-30 ENCOUNTER — Emergency Department: Payer: 59 | Admitting: Anesthesiology

## 2016-10-30 ENCOUNTER — Encounter
Admission: EM | Disposition: A | Discharge status: Home Health | Payer: Self-pay | Source: Home / Self Care | Attending: Surgery

## 2016-10-30 DIAGNOSIS — Z79899 Other long term (current) drug therapy: Secondary | ICD-10-CM

## 2016-10-30 DIAGNOSIS — K352 Acute appendicitis with generalized peritonitis, without abscess: Secondary | ICD-10-CM

## 2016-10-30 DIAGNOSIS — K219 Gastro-esophageal reflux disease without esophagitis: Secondary | ICD-10-CM | POA: Diagnosis present

## 2016-10-30 DIAGNOSIS — Z7982 Long term (current) use of aspirin: Secondary | ICD-10-CM

## 2016-10-30 DIAGNOSIS — I10 Essential (primary) hypertension: Secondary | ICD-10-CM | POA: Diagnosis present

## 2016-10-30 DIAGNOSIS — Z6841 Body Mass Index (BMI) 40.0 and over, adult: Secondary | ICD-10-CM | POA: Diagnosis not present

## 2016-10-30 DIAGNOSIS — Z9049 Acquired absence of other specified parts of digestive tract: Secondary | ICD-10-CM | POA: Diagnosis not present

## 2016-10-30 DIAGNOSIS — F329 Major depressive disorder, single episode, unspecified: Secondary | ICD-10-CM | POA: Diagnosis present

## 2016-10-30 DIAGNOSIS — Z791 Long term (current) use of non-steroidal anti-inflammatories (NSAID): Secondary | ICD-10-CM

## 2016-10-30 DIAGNOSIS — R1031 Right lower quadrant pain: Secondary | ICD-10-CM | POA: Diagnosis not present

## 2016-10-30 DIAGNOSIS — Z8249 Family history of ischemic heart disease and other diseases of the circulatory system: Secondary | ICD-10-CM | POA: Diagnosis not present

## 2016-10-30 DIAGNOSIS — Z8719 Personal history of other diseases of the digestive system: Secondary | ICD-10-CM

## 2016-10-30 DIAGNOSIS — H409 Unspecified glaucoma: Secondary | ICD-10-CM | POA: Diagnosis present

## 2016-10-30 DIAGNOSIS — K560 Paralytic ileus: Secondary | ICD-10-CM | POA: Diagnosis not present

## 2016-10-30 DIAGNOSIS — K279 Peptic ulcer, site unspecified, unspecified as acute or chronic, without hemorrhage or perforation: Secondary | ICD-10-CM | POA: Diagnosis present

## 2016-10-30 DIAGNOSIS — K3589 Other acute appendicitis: Secondary | ICD-10-CM | POA: Diagnosis not present

## 2016-10-30 DIAGNOSIS — K35209 Acute appendicitis with generalized peritonitis, without abscess, unspecified as to perforation: Secondary | ICD-10-CM | POA: Insufficient documentation

## 2016-10-30 DIAGNOSIS — Z800 Family history of malignant neoplasm of digestive organs: Secondary | ICD-10-CM | POA: Diagnosis not present

## 2016-10-30 DIAGNOSIS — K660 Peritoneal adhesions (postprocedural) (postinfection): Secondary | ICD-10-CM | POA: Diagnosis present

## 2016-10-30 DIAGNOSIS — G4730 Sleep apnea, unspecified: Secondary | ICD-10-CM | POA: Diagnosis present

## 2016-10-30 DIAGNOSIS — K3580 Unspecified acute appendicitis: Secondary | ICD-10-CM | POA: Diagnosis present

## 2016-10-30 DIAGNOSIS — Z5331 Laparoscopic surgical procedure converted to open procedure: Secondary | ICD-10-CM

## 2016-10-30 DIAGNOSIS — Z888 Allergy status to other drugs, medicaments and biological substances status: Secondary | ICD-10-CM

## 2016-10-30 DIAGNOSIS — E7800 Pure hypercholesterolemia, unspecified: Secondary | ICD-10-CM | POA: Diagnosis present

## 2016-10-30 DIAGNOSIS — E269 Hyperaldosteronism, unspecified: Secondary | ICD-10-CM | POA: Diagnosis present

## 2016-10-30 DIAGNOSIS — R5082 Postprocedural fever: Secondary | ICD-10-CM

## 2016-10-30 HISTORY — DX: Failed or difficult intubation, initial encounter: T88.4XXA

## 2016-10-30 HISTORY — PX: LAPAROSCOPIC APPENDECTOMY: SHX408

## 2016-10-30 LAB — COMPREHENSIVE METABOLIC PANEL
ALK PHOS: 98 U/L (ref 38–126)
ALT: 22 U/L (ref 17–63)
ANION GAP: 7 (ref 5–15)
AST: 31 U/L (ref 15–41)
Albumin: 3.7 g/dL (ref 3.5–5.0)
BUN: 20 mg/dL (ref 6–20)
CALCIUM: 8.9 mg/dL (ref 8.9–10.3)
CO2: 27 mmol/L (ref 22–32)
CREATININE: 0.69 mg/dL (ref 0.61–1.24)
Chloride: 106 mmol/L (ref 101–111)
GFR calc non Af Amer: >60 mL/min (ref >60)
Glucose, Bld: 127 mg/dL — ABNORMAL HIGH (ref 65–99)
Potassium: 3.5 mmol/L (ref 3.5–5.1)
Sodium: 140 mmol/L (ref 135–145)
Total Bilirubin: 0.5 mg/dL (ref 0.3–1.2)
Total Protein: 7.2 g/dL (ref 6.5–8.1)

## 2016-10-30 LAB — URINALYSIS, COMPLETE (UACMP) WITH MICROSCOPIC
Bilirubin Urine: NEGATIVE
GLUCOSE, UA: NEGATIVE mg/dL
HGB URINE DIPSTICK: NEGATIVE
KETONES UR: NEGATIVE mg/dL
LEUKOCYTES UA: NEGATIVE
NITRITE: NEGATIVE
PH: 8 (ref 5.0–8.0)
Protein, ur: NEGATIVE mg/dL
Specific Gravity, Urine: 1.016 (ref 1.005–1.030)

## 2016-10-30 LAB — CBC
HCT: 42.5 % (ref 40.0–52.0)
Hemoglobin: 14.8 g/dL (ref 13.0–18.0)
MCH: 30.4 pg (ref 26.0–34.0)
MCHC: 34.9 g/dL (ref 32.0–36.0)
MCV: 87.2 fL (ref 80.0–100.0)
PLATELETS: 168 10*3/uL (ref 150–440)
RBC: 4.87 MIL/uL (ref 4.40–5.90)
RDW: 13.2 % (ref 11.5–14.5)
WBC: 12.4 10*3/uL — AB (ref 3.8–10.6)

## 2016-10-30 LAB — LIPASE, BLOOD: Lipase: 29 U/L (ref 11–51)

## 2016-10-30 LAB — LACTIC ACID, PLASMA: Lactic Acid, Venous: 1.2 mmol/L (ref 0.5–1.9)

## 2016-10-30 SURGERY — APPENDECTOMY, LAPAROSCOPIC
Anesthesia: General | Wound class: Clean Contaminated

## 2016-10-30 MED ORDER — ONDANSETRON HCL 4 MG/2ML IJ SOLN
INTRAMUSCULAR | Status: DC | PRN
  Administered 2016-10-30: 4 mg via INTRAVENOUS

## 2016-10-30 MED ORDER — MORPHINE SULFATE (PF) 4 MG/ML IV SOLN
INTRAVENOUS | Status: AC
  Administered 2016-10-30: 4 mg via INTRAVENOUS
  Filled 2016-10-30: qty 1

## 2016-10-30 MED ORDER — ONDANSETRON HCL 4 MG/2ML IJ SOLN
4.0000 mg | Freq: Once | INTRAMUSCULAR | Status: AC
  Administered 2016-10-30: 4 mg via INTRAVENOUS

## 2016-10-30 MED ORDER — ONDANSETRON HCL 4 MG/2ML IJ SOLN
4.0000 mg | Freq: Four times a day (QID) | INTRAMUSCULAR | Status: DC | PRN
  Administered 2016-10-31 – 2016-11-03 (×5): 4 mg via INTRAVENOUS
  Filled 2016-10-30 (×5): qty 2

## 2016-10-30 MED ORDER — FENTANYL CITRATE (PF) 100 MCG/2ML IJ SOLN
INTRAMUSCULAR | Status: DC | PRN
  Administered 2016-10-30 (×3): 100 ug via INTRAVENOUS

## 2016-10-30 MED ORDER — KETOROLAC TROMETHAMINE 30 MG/ML IJ SOLN: 30.0000 mg | Freq: Four times a day (QID) | INTRAMUSCULAR | Status: DC | PRN

## 2016-10-30 MED ORDER — SODIUM CHLORIDE 0.9 % IV BOLUS (SEPSIS)
1000.0000 mL | Freq: Once | INTRAVENOUS | Status: AC
  Administered 2016-10-30: 1000 mL via INTRAVENOUS

## 2016-10-30 MED ORDER — PROPOFOL 10 MG/ML IV BOLUS
INTRAVENOUS | Status: DC | PRN
  Administered 2016-10-30: 200 mg via INTRAVENOUS

## 2016-10-30 MED ORDER — IOPAMIDOL (ISOVUE-300) INJECTION 61%
30.0000 mL | Freq: Once | INTRAVENOUS | Status: AC | PRN
  Administered 2016-10-30: 30 mL via ORAL

## 2016-10-30 MED ORDER — NEBIVOLOL HCL 10 MG PO TABS
10.0000 mg | ORAL_TABLET | Freq: Every day | ORAL | Status: DC
  Administered 2016-10-30 – 2016-11-01 (×3): 10 mg via ORAL
  Filled 2016-10-30 (×3): qty 1

## 2016-10-30 MED ORDER — PROMETHAZINE HCL 25 MG/ML IJ SOLN
INTRAMUSCULAR | Status: AC
  Administered 2016-10-30: 12.5 mg via INTRAVENOUS
  Filled 2016-10-30: qty 1

## 2016-10-30 MED ORDER — ONDANSETRON HCL 4 MG/2ML IJ SOLN
4.0000 mg | INTRAMUSCULAR | Status: AC
  Administered 2016-10-30: 4 mg via INTRAVENOUS

## 2016-10-30 MED ORDER — OXYCODONE HCL 5 MG PO TABS
5.0000 mg | ORAL_TABLET | ORAL | Status: DC | PRN
  Administered 2016-10-30 – 2016-11-02 (×5): 10 mg via ORAL
  Administered 2016-11-02: 5 mg via ORAL
  Filled 2016-10-30 (×5): qty 2
  Filled 2016-10-30 (×2): qty 1

## 2016-10-30 MED ORDER — FENTANYL CITRATE (PF) 100 MCG/2ML IJ SOLN
INTRAMUSCULAR | Status: AC
  Filled 2016-10-30: qty 2

## 2016-10-30 MED ORDER — DIPHENHYDRAMINE HCL 12.5 MG/5ML PO ELIX
12.5000 mg | ORAL_SOLUTION | Freq: Four times a day (QID) | ORAL | Status: DC | PRN
  Filled 2016-10-30: qty 5

## 2016-10-30 MED ORDER — ONDANSETRON HCL 4 MG/2ML IJ SOLN
INTRAMUSCULAR | Status: AC
  Administered 2016-10-30: 4 mg via INTRAVENOUS
  Filled 2016-10-30: qty 2

## 2016-10-30 MED ORDER — KETOROLAC TROMETHAMINE 30 MG/ML IJ SOLN
30.0000 mg | Freq: Four times a day (QID) | INTRAMUSCULAR | Status: DC
  Administered 2016-10-30 – 2016-11-01 (×9): 30 mg via INTRAVENOUS
  Filled 2016-10-30 (×9): qty 1

## 2016-10-30 MED ORDER — ONDANSETRON HCL 4 MG/2ML IJ SOLN
INTRAMUSCULAR | Status: AC
  Filled 2016-10-30: qty 2

## 2016-10-30 MED ORDER — PIPERACILLIN-TAZOBACTAM 3.375 G IVPB
3.3750 g | Freq: Three times a day (TID) | INTRAVENOUS | Status: DC
  Administered 2016-10-30 – 2016-11-01 (×6): 3.375 g via INTRAVENOUS
  Filled 2016-10-30 (×9): qty 50

## 2016-10-30 MED ORDER — MIDAZOLAM HCL 2 MG/2ML IJ SOLN
INTRAMUSCULAR | Status: AC
  Filled 2016-10-30: qty 2

## 2016-10-30 MED ORDER — HYDRALAZINE HCL 20 MG/ML IJ SOLN: 10.0000 mg | INTRAMUSCULAR | Status: DC | PRN

## 2016-10-30 MED ORDER — MIDAZOLAM HCL 2 MG/2ML IJ SOLN
INTRAMUSCULAR | Status: DC | PRN
  Administered 2016-10-30: 2 mg via INTRAVENOUS

## 2016-10-30 MED ORDER — SUCCINYLCHOLINE CHLORIDE 20 MG/ML IJ SOLN
INTRAMUSCULAR | Status: AC
  Filled 2016-10-30: qty 1

## 2016-10-30 MED ORDER — MORPHINE SULFATE (PF) 4 MG/ML IV SOLN
4.0000 mg | Freq: Once | INTRAVENOUS | Status: AC
Start: 2016-10-30 — End: 2016-10-30
  Administered 2016-10-30: 4 mg via INTRAVENOUS

## 2016-10-30 MED ORDER — ROCURONIUM BROMIDE 100 MG/10ML IV SOLN
INTRAVENOUS | Status: DC | PRN
  Administered 2016-10-30: 20 mg via INTRAVENOUS
  Administered 2016-10-30: 50 mg via INTRAVENOUS
  Administered 2016-10-30: 20 mg via INTRAVENOUS
  Administered 2016-10-30: 30 mg via INTRAVENOUS
  Administered 2016-10-30: 20 mg via INTRAVENOUS

## 2016-10-30 MED ORDER — OXYCODONE HCL 5 MG/5ML PO SOLN: 5.0000 mg | Freq: Once | ORAL | Status: DC | PRN

## 2016-10-30 MED ORDER — SUCCINYLCHOLINE CHLORIDE 20 MG/ML IJ SOLN
INTRAMUSCULAR | Status: DC | PRN
  Administered 2016-10-30: 200 mg via INTRAVENOUS

## 2016-10-30 MED ORDER — OXYCODONE HCL 5 MG PO TABS: 5.0000 mg | ORAL_TABLET | Freq: Once | ORAL | Status: DC | PRN

## 2016-10-30 MED ORDER — ENOXAPARIN SODIUM 40 MG/0.4ML ~~LOC~~ SOLN
40.0000 mg | SUBCUTANEOUS | Status: DC
  Administered 2016-10-31 – 2016-11-01 (×2): 40 mg via SUBCUTANEOUS
  Filled 2016-10-30 (×2): qty 0.4

## 2016-10-30 MED ORDER — ONDANSETRON 4 MG PO TBDP
4.0000 mg | ORAL_TABLET | Freq: Four times a day (QID) | ORAL | Status: DC | PRN
  Administered 2016-11-02: 4 mg via ORAL
  Filled 2016-10-30: qty 1

## 2016-10-30 MED ORDER — BUPIVACAINE-EPINEPHRINE (PF) 0.25% -1:200000 IJ SOLN
INTRAMUSCULAR | Status: AC
  Filled 2016-10-30: qty 30

## 2016-10-30 MED ORDER — PANTOPRAZOLE SODIUM 40 MG IV SOLR
40.0000 mg | Freq: Every day | INTRAVENOUS | Status: DC
  Administered 2016-10-30 – 2016-11-05 (×7): 40 mg via INTRAVENOUS
  Filled 2016-10-30 (×8): qty 40

## 2016-10-30 MED ORDER — LOSARTAN POTASSIUM 50 MG PO TABS
50.0000 mg | ORAL_TABLET | Freq: Every day | ORAL | Status: DC
  Administered 2016-10-30 – 2016-11-01 (×3): 50 mg via ORAL
  Filled 2016-10-30 (×3): qty 1

## 2016-10-30 MED ORDER — MORPHINE SULFATE (PF) 4 MG/ML IV SOLN
4.0000 mg | Freq: Once | INTRAVENOUS | Status: AC
  Administered 2016-10-30: 4 mg via INTRAVENOUS

## 2016-10-30 MED ORDER — FENTANYL CITRATE (PF) 100 MCG/2ML IJ SOLN
25.0000 ug | INTRAMUSCULAR | Status: DC | PRN
  Administered 2016-10-30 (×4): 25 ug via INTRAVENOUS

## 2016-10-30 MED ORDER — DEXAMETHASONE SODIUM PHOSPHATE 10 MG/ML IJ SOLN
INTRAMUSCULAR | Status: DC | PRN
  Administered 2016-10-30: 5 mg via INTRAVENOUS

## 2016-10-30 MED ORDER — DIPHENHYDRAMINE HCL 50 MG/ML IJ SOLN: 12.5000 mg | Freq: Four times a day (QID) | INTRAMUSCULAR | Status: DC | PRN

## 2016-10-30 MED ORDER — ACETAMINOPHEN 500 MG PO TABS
1000.0000 mg | ORAL_TABLET | Freq: Four times a day (QID) | ORAL | Status: DC
  Administered 2016-10-30 – 2016-11-02 (×11): 1000 mg via ORAL
  Filled 2016-10-30 (×12): qty 2

## 2016-10-30 MED ORDER — PROMETHAZINE HCL 25 MG/ML IJ SOLN
12.5000 mg | Freq: Once | INTRAMUSCULAR | Status: AC
  Administered 2016-10-30: 12.5 mg via INTRAVENOUS

## 2016-10-30 MED ORDER — FENTANYL CITRATE (PF) 100 MCG/2ML IJ SOLN
50.0000 ug | Freq: Once | INTRAMUSCULAR | Status: AC
  Administered 2016-10-30: 50 ug via INTRAVENOUS

## 2016-10-30 MED ORDER — BUPIVACAINE LIPOSOME 1.3 % IJ SUSP
INTRAMUSCULAR | Status: AC
  Filled 2016-10-30: qty 20

## 2016-10-30 MED ORDER — HYDROMORPHONE HCL 1 MG/ML IJ SOLN
1.0000 mg | INTRAMUSCULAR | Status: AC
Start: 2016-10-30 — End: 2016-10-30
  Administered 2016-10-30: 1 mg via INTRAVENOUS
  Filled 2016-10-30: qty 1

## 2016-10-30 MED ORDER — GLYCOPYRROLATE 0.2 MG/ML IJ SOLN
INTRAMUSCULAR | Status: DC | PRN
  Administered 2016-10-30: 0.2 mg via INTRAVENOUS

## 2016-10-30 MED ORDER — DEXAMETHASONE SODIUM PHOSPHATE 10 MG/ML IJ SOLN
INTRAMUSCULAR | Status: AC
  Filled 2016-10-30: qty 1

## 2016-10-30 MED ORDER — SODIUM CHLORIDE 0.9 % IJ SOLN
INTRAMUSCULAR | Status: AC
  Filled 2016-10-30: qty 50

## 2016-10-30 MED ORDER — LACTATED RINGERS IV SOLN
INTRAVENOUS | Status: DC | PRN
  Administered 2016-10-30: 11:00:00 via INTRAVENOUS

## 2016-10-30 MED ORDER — IOPAMIDOL (ISOVUE-300) INJECTION 61%
125.0000 mL | Freq: Once | INTRAVENOUS | Status: AC | PRN
  Administered 2016-10-30: 125 mL via INTRAVENOUS

## 2016-10-30 MED ORDER — PROPOFOL 10 MG/ML IV BOLUS
INTRAVENOUS | Status: AC
  Filled 2016-10-30: qty 40

## 2016-10-30 MED ORDER — MORPHINE SULFATE (PF) 4 MG/ML IV SOLN
4.0000 mg | INTRAVENOUS | Status: DC | PRN
  Administered 2016-10-31 – 2016-11-04 (×16): 4 mg via INTRAVENOUS
  Filled 2016-10-30 (×17): qty 1

## 2016-10-30 MED ORDER — ROCURONIUM BROMIDE 100 MG/10ML IV SOLN
INTRAVENOUS | Status: AC
  Filled 2016-10-30: qty 1

## 2016-10-30 MED ORDER — HYDROMORPHONE HCL 1 MG/ML IJ SOLN
0.2500 mg | INTRAMUSCULAR | Status: DC | PRN
  Administered 2016-10-30 (×2): 0.5 mg via INTRAVENOUS

## 2016-10-30 MED ORDER — LACTATED RINGERS IV SOLN
INTRAVENOUS | Status: DC
  Administered 2016-10-30 – 2016-11-01 (×7): via INTRAVENOUS

## 2016-10-30 MED ORDER — PIPERACILLIN-TAZOBACTAM 3.375 G IVPB 30 MIN
3.3750 g | Freq: Once | INTRAVENOUS | Status: AC
  Administered 2016-10-30: 3.375 g via INTRAVENOUS
  Filled 2016-10-30: qty 50

## 2016-10-30 SURGICAL SUPPLY — 45 items
APPLIER CLIP 5 13 M/L LIGAMAX5 (MISCELLANEOUS)
BLADE CLIPPER SURG (BLADE) ×2 IMPLANT
BULB RESERV EVAC DRAIN JP 100C (MISCELLANEOUS) ×2 IMPLANT
CANISTER SUCT 1200ML W/VALVE (MISCELLANEOUS) ×2 IMPLANT
CHLORAPREP W/TINT 26ML (MISCELLANEOUS) ×2 IMPLANT
CLIP APPLIE 5 13 M/L LIGAMAX5 (MISCELLANEOUS) IMPLANT
CUTTER FLEX LINEAR 45M (STAPLE) ×2 IMPLANT
DERMABOND ADVANCED (GAUZE/BANDAGES/DRESSINGS) ×1
DERMABOND ADVANCED .7 DNX12 (GAUZE/BANDAGES/DRESSINGS) ×1 IMPLANT
DRAIN CHANNEL JP 19F (MISCELLANEOUS) ×2 IMPLANT
DRAPE TABLE BACK 80X90 (DRAPES) ×2 IMPLANT
DRSG MEPITEL 4X7.2 (GAUZE/BANDAGES/DRESSINGS) ×2 IMPLANT
DRSG VAC ATS LRG SENSATRAC (GAUZE/BANDAGES/DRESSINGS) ×2 IMPLANT
ELECT CAUTERY BLADE 6.4 (BLADE) ×2 IMPLANT
ELECT REM PT RETURN 9FT ADLT (ELECTROSURGICAL) ×2
ELECTRODE REM PT RTRN 9FT ADLT (ELECTROSURGICAL) ×1 IMPLANT
ENDOPOUCH RETRIEVER 10 (MISCELLANEOUS) ×2 IMPLANT
GLOVE BIO SURGEON STRL SZ7 (GLOVE) ×10 IMPLANT
GOWN STRL REUS W/ TWL LRG LVL3 (GOWN DISPOSABLE) ×2 IMPLANT
GOWN STRL REUS W/TWL LRG LVL3 (GOWN DISPOSABLE) ×2
IRRIGATION STRYKERFLOW (MISCELLANEOUS) ×1 IMPLANT
IRRIGATOR STRYKERFLOW (MISCELLANEOUS) ×2
IV NS 1000ML (IV SOLUTION) ×1
IV NS 1000ML BAXH (IV SOLUTION) ×1 IMPLANT
NEEDLE HYPO 22GX1.5 SAFETY (NEEDLE) ×2 IMPLANT
NS IRRIG 500ML POUR BTL (IV SOLUTION) ×2 IMPLANT
PACK LAP CHOLECYSTECTOMY (MISCELLANEOUS) ×2 IMPLANT
PAD NEG PRESSURE SENSATRAC (MISCELLANEOUS) ×2 IMPLANT
PENCIL ELECTRO HAND CTR (MISCELLANEOUS) ×2 IMPLANT
RELOAD 45 VASCULAR/THIN (ENDOMECHANICALS) ×2 IMPLANT
RELOAD STAPLE TA45 3.5 REG BLU (ENDOMECHANICALS) ×2 IMPLANT
SCALPEL HARMONIC ACE (MISCELLANEOUS) ×2 IMPLANT
SCISSORS METZENBAUM CVD 33 (INSTRUMENTS) ×2 IMPLANT
SLEEVE ENDOPATH XCEL 5M (ENDOMECHANICALS) ×2 IMPLANT
SPONGE LAP 18X18 5 PK (GAUZE/BANDAGES/DRESSINGS) ×4 IMPLANT
STAPLER SKIN PROX 35W (STAPLE) ×2 IMPLANT
SUT MNCRL AB 4-0 PS2 18 (SUTURE) ×2 IMPLANT
SUT PDS #1 CTX NDL (SUTURE) ×6 IMPLANT
SUT VICRYL 0 AB UR-6 (SUTURE) ×4 IMPLANT
SYR 20CC LL (SYRINGE) IMPLANT
TRAY FOLEY W/METER SILVER 16FR (SET/KITS/TRAYS/PACK) IMPLANT
TROCAR 5M 150ML BLDLS (TROCAR) ×6 IMPLANT
TROCAR XCEL BLUNT TIP 100MML (ENDOMECHANICALS) ×2 IMPLANT
TROCAR XCEL NON-BLD 5MMX100MML (ENDOMECHANICALS) ×4 IMPLANT
TUBING INSUF HEATED (TUBING) ×2 IMPLANT

## 2016-10-30 NOTE — ED Triage Notes (Signed)
Pt states bilateral lower quadrant pain that began last pm. Pt states he "couldn't stand it anymore" around midnight and decided to seek ed care. Pt tearful and crying in triage. Skin cool, slightly clammy, pink. Pt states has not had a fever, has had emesis x1.

## 2016-10-30 NOTE — OR Nursing (Signed)
Patient having no relief of pain with fent dilaudid given

## 2016-10-30 NOTE — ED Provider Notes (Signed)
[facility] Emergency Department Provider Note  ____________________________________________   First MD Initiated Contact with Patient 10/30/16 [phone]     (approximate)  I have reviewed the triage vital signs and the nursing notes.   HISTORY  Chief Complaint Abdominal Pain    HPI Peter [name] is a 55 y.o. male with a medical history as listed below who presents for evaluation of acute onset severe sharp stabbing abdominal pain accompanied with nausea and vomiting that began about 5 hours ago.  He was feeling "full" and bloated before bed but without any pain.  He awoke around midnight in severe pain.  After several hours it did not resolve and he felt that he could not stand it any longer so came by private vehicle for evaluation.  He has had some nausea and one episode of emesis.  He had a normal bowel movement earlier in the day and try to have another one after arrival in the emergency department but was unable to do so.  He continues to feel bloated as well as having sharp pain.  Nothing in particular makes the symptoms better nor worse.  He had his gallbladder removed more than 20 years ago but has had no other abdominal surgeries.  He denies fever/chills, chest pain, shortness of breath, dysuria.  He is moaning and crying in pain but is able to carry on a conversation with me.  He has not been ill recently although he is being worked up by an endocrinologist for various episodes of sweating and palpitations.  He and his wife state that he recently saw a cardiologist and had a reassuring workup with no evidence of heart disease.   Past Medical History:  Diagnosis Date  . Blood in stool   . Glaucoma    since 2004  . Hypertension    since 2000  . Obesity, unspecified   . Ulcer 2011   gastric  . Unspecified hemorrhoids without mention of complication     Patient Active Problem List   Diagnosis Date Noted  . Hyperaldosteronism ([place]) 09/21/2016  .  Class 3 obesity with body mass index (BMI) of 50.0 to 59.9 in adult ([place]) 09/21/2016  . Decreased libido 09/21/2016  . Shortness of breath 06/15/2016  . Abnormal stress test 06/15/2016  . 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 Avitaminosis D 03/25/2015  . Blood in stool   . Clinical depression 06/21/2011  . Cannot sleep 08/22/2009  . Hypercholesterolemia without hypertriglyceridemia 01/28/2009  . Hay fever 10/22/2008  . Adjustment disorder with mixed anxiety and depressed mood 12/12/2007  . Essential (primary) hypertension 12/12/2007  . Apnea, sleep 12/12/2007    Past Surgical History:  Procedure Laterality Date  . CARDIAC CATHETERIZATION Left 06/23/2016   Procedure: Left Heart Cath and Coronary Angiography;  Surgeon: Nelva Bush, MD;  Location: [place] [facility];  Service: Cardiovascular;  Laterality: Left;  . CHOLECYSTECTOMY  1992  . COLONOSCOPY W/ POLYPECTOMY  05/22/2010   29mmtransverse colon polyp, traditional serrated adenoma,negative for high grade dysplasia & malignancy. rectal polyp-21mm,negative for dysplasia & malignancy.  Marland Kitchen KNEE SURGERY Left 2010    Prior to Admission medications   Medication Sig Start Date End Date Taking? Authorizing Provider  amLODipine (NORVASC) 10 MG tablet TAKE 1 TABLET BY MOUTH EVERY DAY 05/08/16   Jerrol Banana., MD  aspirin EC 81 MG tablet Take 81 mg by mouth daily.    Historical Provider, MD  atorvastatin (LIPITOR) 40 MG tablet Take 1 tablet (40 mg  total) by mouth daily. Patient taking differently: Take 40 mg by mouth every evening.  06/10/16 09/09/16  Wende Bushy, MD  clobetasol cream (TEMOVATE) 5.68 % Apply 1 application topically at bedtime as needed.    Historical Provider, MD  EQUETRO 200 MG CP12 12 hr capsule Take 400-600 mg by mouth 2 (two) times daily. Take 400 mg in the morning & 600 mg in the evening. 03/22/15   Historical Provider, MD  fluticasone (FLONASE) 50 MCG/ACT nasal spray Place 2 sprays into both nostrils  daily. Patient taking differently: Place 2 sprays into both nostrils daily as needed for allergies.  12/23/15   Vickki Muff Chrismon, PA  Garlic 127 MG TABS Take 100 mg by mouth 2 (two) times daily.     Historical Provider, MD  hydrALAZINE (APRESOLINE) 25 MG tablet TAKE 1 TABLET(25 MG) BY MOUTH THREE TIMES DAILY 10/04/16   Wende Bushy, MD  ibuprofen (ADVIL,MOTRIN) 200 MG tablet Take 800 mg by mouth 3 (three) times daily as needed for headache or moderate pain.    Historical Provider, MD  lamoTRIgine (LAMICTAL) 200 MG tablet Take 400 mg by mouth at bedtime.     Historical Provider, MD  latanoprost (XALATAN) 0.005 % ophthalmic solution Place 1 drop into both eyes daily.    Historical Provider, MD  losartan (COZAAR) 100 MG tablet TAKE 1 TABLET BY MOUTH EVERY DAY 05/08/16   Jerrol Banana., MD  MAGNESIUM-OXIDE 400 (241.3 Mg) MG tablet Take 400 mg by mouth 2 (two) times daily. 06/05/16   Historical Provider, MD  Nebivolol HCl 20 MG TABS Take 1 tablet (20 mg total) by mouth every evening. 06/23/16   Nelva Bush, MD  OMEGA-3 FATTY ACIDS PO Take 1 capsule by mouth 2 (two) times daily.  11/17/09   Historical Provider, MD  potassium chloride SA (K-DUR,KLOR-CON) 20 MEQ tablet Take 2 tablets (40 mEq total) by mouth 3 (three) times daily. 07/01/16   Nelva Bush, MD  ranitidine (ZANTAC) 300 MG tablet TAKE 1 TABLET(300 MG) BY MOUTH TWICE DAILY 01/12/16   Jerrol Banana., MD  RUTIN, MISC. NUTRITIONAL FACTORS, TAKE 1 TABLET (500 MG) BY MOUTH DAILY 08/07/09   Historical Provider, MD  sertraline (ZOLOFT) 100 MG tablet TAKE 1 TABLET BY MOUTH TWICE DAILY Patient taking differently: TAKE 2 TABLETS (200 MG) BY MOUTH DAILY 05/08/16   Jerrol Banana., MD  spironolactone (ALDACTONE) 50 MG tablet Take 1 tablet (50 mg total) by mouth daily. Patient not taking: Reported on 09/21/2016 07/28/16   Jerrol Banana., MD  vitamin C (ASCORBIC ACID) 500 MG tablet Take 500 mg by mouth every evening.     Historical  Provider, MD  Vitamin D, Ergocalciferol, (DRISDOL) 50000 units CAPS capsule Take 1 capsule (50,000 Units total) by mouth every 7 (seven) days. 10/13/16   Richard Maceo Pro., MD    Allergies Isosorbide  Family History  Problem Relation Age of Onset  . Cancer Other     colon  . Hyperlipidemia Mother   . Hypertension Mother     Social History Social History  Substance Use Topics  . Smoking status: Never Smoker  . Smokeless tobacco: Never Used  . Alcohol use Yes     Comment: occasionally    Review of Systems Constitutional: No fever/chills Eyes: No visual changes. ENT: No sore throat. Cardiovascular: Denies chest pain. Respiratory: Denies shortness of breath. Gastrointestinal: Severe sharp abdominal pain with one episode of emesis and bloating Genitourinary: Negative for dysuria. Musculoskeletal: Negative  for back pain. Integumentary: Negative for rash. Neurological: Negative for headaches, focal weakness or numbness.   ____________________________________________   PHYSICAL EXAM:  VITAL SIGNS: ED Triage Vitals  Enc Vitals Group     BP 10/30/16 0357 (!) 158/97     Pulse Rate 10/30/16 0355 62     Resp 10/30/16 0355 (!) 30     Temp 10/30/16 0355 99 F (37.2 C)     Temp Source 10/30/16 0355 Oral     SpO2 10/30/16 0355 93 %     Weight 10/30/16 0356 (!) 310 lb (140.6 kg)     Height 10/30/16 0356 5\' 7"  (1.702 m)     Head Circumference --      Peak Flow --      Pain Score 10/30/16 0355 9     Pain Loc --      Pain Edu? --      Excl. in [place]? --     Constitutional: Alert and oriented. Moaning, crying out, appears very uncomfortable Eyes: Conjunctivae are normal. PERRL. EOMI. Head: Atraumatic. Nose: No congestion/rhinnorhea. Mouth/Throat: Mucous membranes are moist. Neck: No stridor.  No meningeal signs.   Cardiovascular: Normal rate, regular rhythm. Good peripheral circulation. Grossly normal heart sounds. Respiratory: Normal respiratory effort.  No  retractions. Lungs CTAB. Gastrointestinal: Morbid obesity.  I do not appreciate bowel sounds but this may be secondary to his habitus and discomfort.  Soft with moderate diffuse tenderness throughout the abdomen with no specific focal tenderness. Musculoskeletal: No lower extremity tenderness nor edema. No gross deformities of extremities. Neurologic:  Normal speech and language. No gross focal neurologic deficits are appreciated.  Skin:  Skin is warm, dry and intact. No rash noted.   ____________________________________________   LABS (all labs ordered are listed, but only abnormal results are displayed)  Labs Reviewed  COMPREHENSIVE METABOLIC PANEL - Abnormal; Notable for the following:       Result Value   Glucose, Bld 127 (*)    All other components within normal limits  CBC - Abnormal; Notable for the following:    WBC 12.4 (*)    All other components within normal limits  URINALYSIS, COMPLETE (UACMP) WITH MICROSCOPIC - Abnormal; Notable for the following:    Color, Urine YELLOW (*)    APPearance CLOUDY (*)    Bacteria, UA RARE (*)    Squamous Epithelial / LPF 0-5 (*)    All other components within normal limits  LIPASE, BLOOD  LACTIC ACID, PLASMA   ____________________________________________  EKG  ED ECG REPORT I, Niamya Vittitow, the attending physician, personally viewed and interpreted this ECG.  Date: 10/30/2016 EKG Time: 07:05 Rate: 67 Rhythm: normal sinus rhythm QRS Axis: normal Intervals: normal ST/T Wave abnormalities: diffuse 1-mm ST segment elevation, inverted T wave in lead III Conduction Disturbances: none Narrative Interpretation: Non-specific ST segment / T-wave changes, but no evidence of acute ischemia.  ____________________________________________  RADIOLOGY   Ct Abdomen Pelvis W Contrast  Result Date: 10/30/2016 CLINICAL DATA:  56 year old male with abdominal pain, nausea vomiting. EXAM: CT ABDOMEN AND PELVIS WITH CONTRAST TECHNIQUE:  Multidetector CT imaging of the abdomen and pelvis was performed using the standard protocol following bolus administration of intravenous contrast. CONTRAST:  175mL ISOVUE-300 IOPAMIDOL (ISOVUE-300) INJECTION 61% COMPARISON:  None. FINDINGS: Lower chest: There is mild eventration of the right hemidiaphragm with minimal bibasilar linear atelectasis/ scarring. A 5 mm subpleural nodule noted along the right major fissure (series 2 image 3). There is no intra-abdominal free air or free fluid.  Hepatobiliary: Cholecystectomy. There is mild intrahepatic biliary ductal dilatation, likely post cholecystectomy. There is normal tapering of the CBD at the head of the pancreas. No retained calcified CBD stones noted. The liver is unremarkable. Pancreas: Unremarkable. No pancreatic ductal dilatation or surrounding inflammatory changes. Spleen: Normal in size without focal abnormality. Adrenals/Urinary Tract: There is linear calcification of the left adrenal gland, likely sequela of prior infection or hemorrhage. A 13 mm ill-defined hypodense nodule in the left adrenal gland is not well characterized but may represent an adenoma or scarring related to prior insult. MRI may provide better characterisation of the adrenal gland. The right adrenal gland appears unremarkable. There is a 15 mm left renal parapelvic cyst. The kidneys are otherwise unremarkable. There is homogeneous and symmetric uptake and enhancement of the kidneys and excretion of the contrast into the collecting system bilaterally. The visualized ureters and urinary bladder appear unremarkable. Stomach/Bowel: There is diverticulosis of the colon primarily involving the transverse colon. No active inflammatory changes. There is abutment of a short segment of small bowel to the transverse colon likely representing adhesions, likely sequela of prior diverticulitis. There is no evidence of bowel obstruction. The appendix is enlarged and inflamed. Multiple appendicolith  noted within the lumen of the appendix. The appendix is located in the right lower quadrant medial to the cecum. There is no drainable fluid collection/abscess or evidence of perforation. Vascular/Lymphatic: No significant vascular findings are present. No enlarged abdominal or pelvic lymph nodes. Reproductive: The prostate and seminal vesicles are grossly unremarkable. Other: No abdominal wall hernia or abnormality. No abdominopelvic ascites. Musculoskeletal: Degenerative changes of the spine. L5-S1 disc desiccation with vacuum phenomena and facet arthropathy. No acute fracture. IMPRESSION: 1. Acute appendicitis.  No abscess or evidence of perforation. 2. Colonic diverticulosis without active inflammatory changes. No bowel obstruction. 3. Linear and coarse calcification of the left adrenal gland possibly sequela of prior insult. A 13 mm hypodense left adrenal nodule is not well characterized. MRI may provide better characterization. 4. Prior cholecystectomy. 5. A 5 mm right lung base subpleural nodule. Nonemergent CT of the chest may provide complete evaluation of the lungs if clinically indicated. Electronically Signed   By: Anner Crete M.D.   On: 10/30/2016 06:37    ____________________________________________   PROCEDURES  Critical Care performed: No   Procedure(s) performed:   Procedures   ____________________________________________   INITIAL IMPRESSION / ASSESSMENT AND PLAN / ED COURSE  Pertinent labs & imaging results that were available during my care of the patient were reviewed by me and considered in my medical decision making (see chart for details).  His presentation is most consistent with SBO although AAA versus dissection is also possible.  ACS much less likely.  Labs are pending but his kidney function is normal and I will proceed with a CT scan of his abdomen and pelvis for further evaluation.   Clinical Course as of Oct 30 708  Sat Oct 30, 2016  0648 Acute  appendicitis without evidence of perforation or abscess on CT scan.  I updated the patient and his wife.  He is still moaning somewhat but the pain is improved from prior.  He still has generalized peritonitis throughout.  I called and spoke by phone with Dr. Adonis Huguenin and his daytime relief surgeon will come down and evaluate the patient for further management.  [CF]    Clinical Course User Index [CF] Hinda Kehr, MD    ____________________________________________  FINAL CLINICAL IMPRESSION(S) / ED DIAGNOSES  Final diagnoses:  Acute appendicitis with generalized peritonitis     MEDICATIONS GIVEN DURING THIS VISIT:  Medications  morphine 4 MG/ML injection 4 mg (4 mg Intravenous Given 10/30/16 0429)  ondansetron (ZOFRAN) injection 4 mg (4 mg Intravenous Given 10/30/16 0429)  HYDROmorphone (DILAUDID) injection 1 mg (1 mg Intravenous Given 10/30/16 0503)  iopamidol (ISOVUE-300) 61 % injection 30 mL (30 mLs Oral Contrast Given 10/30/16 0511)  ondansetron (ZOFRAN) injection 4 mg (4 mg Intravenous Given 10/30/16 0537)  iopamidol (ISOVUE-300) 61 % injection 125 mL (125 mLs Intravenous Contrast Given 10/30/16 0557)  promethazine (PHENERGAN) injection 12.5 mg (12.5 mg Intravenous Given 10/30/16 0657)  morphine 4 MG/ML injection 4 mg (4 mg Intravenous Given 10/30/16 0701)     NEW OUTPATIENT MEDICATIONS STARTED DURING THIS VISIT:  New Prescriptions   No medications on file    Modified Medications   No medications on file    Discontinued Medications   No medications on file     Note:  This document was prepared using Dragon voice recognition software and may include unintentional dictation errors.    Hinda Kehr, MD 10/30/16 [phone]

## 2016-10-30 NOTE — ED Notes (Signed)
Patient's oxygen saturation decreased to 89%. O2 increased to 3L [place]. [name] oxygen saturation currently 97%. RN will continue to monitor.

## 2016-10-30 NOTE — Anesthesia Preprocedure Evaluation (Addendum)
Anesthesia Evaluation  Patient identified by MRN, date of birth, ID band Patient awake    Reviewed: Allergy & Precautions, H&P , NPO status , Patient's Chart, lab work & pertinent test results  History of Anesthesia Complications Negative for: history of anesthetic complications  Airway Mallampati: III  TM Distance: <3 FB Neck ROM: full    Dental  (+) Chipped, Caps   Pulmonary neg shortness of breath, sleep apnea ,    Pulmonary exam normal breath sounds clear to auscultation       Cardiovascular Exercise Tolerance: Good hypertension, (-) angina(-) Past MI and (-) DOE Normal cardiovascular exam Rhythm:regular Rate:Normal     Neuro/Psych PSYCHIATRIC DISORDERS Depression negative neurological ROS     GI/Hepatic negative GI ROS, Neg liver ROS, neg GERD  ,  Endo/Other  negative endocrine ROS  Renal/GU      Musculoskeletal   Abdominal   Peds  Hematology negative hematology ROS (+)   Anesthesia Other Findings Past Medical History: No date: Blood in stool No date: Glaucoma     Comment: since 2004 No date: Hypertension     Comment: since 2000 No date: Obesity, unspecified 2011: Ulcer     Comment: gastric No date: Unspecified hemorrhoids without mention of com*  Past Surgical History: 06/23/2016: CARDIAC CATHETERIZATION Left     Comment: Procedure: Left Heart Cath and Coronary               Angiography;  Surgeon: Nelva Bush, MD;                Location: [place] [facility];  Service:               Cardiovascular;  Laterality: Left; 1992: CHOLECYSTECTOMY 05/22/2010: COLONOSCOPY W/ POLYPECTOMY     Comment: 5mmtransverse colon polyp, traditional               serrated adenoma,negative for high grade               dysplasia & malignancy. rectal               polyp-85mm,negative for dysplasia & malignancy. 2010: KNEE SURGERY Left  BMI    Body Mass Index:  48.55 kg/m      Reproductive/Obstetrics negative  OB ROS                             Anesthesia Physical Anesthesia Plan  ASA: III  Anesthesia Plan: General ETT, Rapid Sequence and Cricoid Pressure   Post-op Pain Management:    Induction: Intravenous  Airway Management Planned: Oral ETT  Additional Equipment:   Intra-op Plan:   Post-operative Plan: Extubation in OR  Informed Consent: I have reviewed the patients History and Physical, chart, labs and discussed the procedure including the risks, benefits and alternatives for the proposed anesthesia with the patient or authorized representative who has indicated his/her understanding and acceptance.   Dental Advisory Given  Plan Discussed with: Anesthesiologist, CRNA and Surgeon  Anesthesia Plan Comments: (Patient consented for risks of anesthesia including but not limited to:  - adverse reactions to medications - damage to teeth, lips or other oral mucosa - sore throat or hoarseness - Damage to heart, brain, lungs or loss of life  Patient voiced understanding.)       Anesthesia Quick Evaluation

## 2016-10-30 NOTE — ED Notes (Signed)
Patient became nauseated after attempting unsuccessfully to have a bowel movement. MD notified. Zofran ordered. Zofran administered.

## 2016-10-30 NOTE — H&P (Addendum)
Patient ID: Peter [name], male   DOB: Sep 01, 1960, 56 y.o.   MRN: [id number]  HPI Peter [name] is a 56 y.o. male an 18 hour history of abdominal pain that started last night. He reports that the pain is located in the right lower quadrant is moderate to severely in intensity and is intermittent. Pain is worsening with movement or cough. Pain is sharp and it does not radiate. He does have associated nausea and decreased appetite. No fevers no chills. He is morbidly obese and significant hypertension. He is being currently worked up for hyperaldosteronism so far his workup has been negative. He had a recent cardiac catheter because of some shortness of breath and his coronaries were clean. I have personally reviewed and there is also some diastolic dysfunction but he's got a preserved ejection fraction. CT scan personal review there is evidence of dilated appendix with appendiceal inflammation. No evidence of perforation, no free air or abscess. His white count is elevated and his creatinine is normal. He denies any recent angina or change in his overall cardiovascular status. Only previous abd operation is a lap chole.  HPI  Past Medical History:  Diagnosis Date  . Blood in stool   . Glaucoma    since 2004  . Hypertension    since 2000  . Obesity, unspecified   . Ulcer 2011   gastric  . Unspecified hemorrhoids without mention of complication     Past Surgical History:  Procedure Laterality Date  . CARDIAC CATHETERIZATION Left 06/23/2016   Procedure: Left Heart Cath and Coronary Angiography;  Surgeon: Nelva Bush, MD;  Location: [place] [facility];  Service: Cardiovascular;  Laterality: Left;  . CHOLECYSTECTOMY  1992  . COLONOSCOPY W/ POLYPECTOMY  05/22/2010   60mmtransverse colon polyp, traditional serrated adenoma,negative for high grade dysplasia & malignancy. rectal polyp-55mm,negative for dysplasia & malignancy.  Marland Kitchen KNEE SURGERY Left 2010    Family History  Problem  Relation Age of Onset  . Cancer Other     colon  . Hyperlipidemia Mother   . Hypertension Mother     Social History Social History  Substance Use Topics  . Smoking status: Never Smoker  . Smokeless tobacco: Never Used  . Alcohol use Yes     Comment: occasionally    Allergies  Allergen Reactions  . Isosorbide     Headache, nausea, vomiting    No current facility-administered medications for this encounter.    Current Outpatient Prescriptions  Medication Sig Dispense Refill  . amLODipine (NORVASC) 10 MG tablet TAKE 1 TABLET BY MOUTH EVERY DAY 90 tablet 2  . aspirin EC 81 MG tablet Take 81 mg by mouth daily.    Marland Kitchen atorvastatin (LIPITOR) 40 MG tablet Take 1 tablet (40 mg total) by mouth daily. (Patient taking differently: Take 40 mg by mouth every evening. ) 30 tablet 6  . clobetasol cream (TEMOVATE) 3.22 % Apply 1 application topically at bedtime as needed.    Marland Kitchen EQUETRO 200 MG CP12 12 hr capsule Take 400-600 mg by mouth 2 (two) times daily. Take 400 mg in the morning & 600 mg in the evening.  7  . fluticasone (FLONASE) 50 MCG/ACT nasal spray Place 2 sprays into both nostrils daily. (Patient taking differently: Place 2 sprays into both nostrils daily as needed for allergies. ) 16 g 6  . Garlic 025 MG TABS Take 100 mg by mouth 2 (two) times daily.     . hydrALAZINE (APRESOLINE) 25 MG tablet TAKE 1  TABLET(25 MG) BY MOUTH THREE TIMES DAILY 90 tablet 1  . ibuprofen (ADVIL,MOTRIN) 200 MG tablet Take 800 mg by mouth 3 (three) times daily as needed for headache or moderate pain.    Marland Kitchen lamoTRIgine (LAMICTAL) 200 MG tablet Take 400 mg by mouth at bedtime.     Marland Kitchen latanoprost (XALATAN) 0.005 % ophthalmic solution Place 1 drop into both eyes daily.    Marland Kitchen losartan (COZAAR) 100 MG tablet TAKE 1 TABLET BY MOUTH EVERY DAY 90 tablet 2  . MAGNESIUM-OXIDE 400 (241.3 Mg) MG tablet Take 400 mg by mouth 2 (two) times daily.  12  . Nebivolol HCl 20 MG TABS Take 1 tablet (20 mg total) by mouth every evening.  30 tablet 5  . OMEGA-3 FATTY ACIDS PO Take 1 capsule by mouth 2 (two) times daily.     . potassium chloride SA (K-DUR,KLOR-CON) 20 MEQ tablet Take 2 tablets (40 mEq total) by mouth 3 (three) times daily. 180 tablet 4  . ranitidine (ZANTAC) 300 MG tablet TAKE 1 TABLET(300 MG) BY MOUTH TWICE DAILY 60 tablet 12  . RUTIN, MISC. NUTRITIONAL FACTORS, TAKE 1 TABLET (500 MG) BY MOUTH DAILY    . sertraline (ZOLOFT) 100 MG tablet TAKE 1 TABLET BY MOUTH TWICE DAILY (Patient taking differently: TAKE 2 TABLETS (200 MG) BY MOUTH DAILY) 180 tablet 2  . vitamin C (ASCORBIC ACID) 500 MG tablet Take 500 mg by mouth every evening.     . Vitamin D, Ergocalciferol, (DRISDOL) 50000 units CAPS capsule Take 1 capsule (50,000 Units total) by mouth every 7 (seven) days. 12 capsule 0  . spironolactone (ALDACTONE) 50 MG tablet Take 1 tablet (50 mg total) by mouth daily. (Patient not taking: Reported on 09/21/2016) 30 tablet 12     Review of Systems Full ROS  was asked and was negative except for the information on the HPI  Physical Exam Blood pressure (!) 166/89, pulse 64, temperature 99 F (37.2 C), temperature source Oral, resp. rate 18, height 5\' 7"  (1.702 m), weight (!) 140.6 kg (310 lb), SpO2 95 %. CONSTITUTIONAL: morbidly obese male in NAD, non toxic EYES: Pupils are equal, round, and reactive to light, Sclera are non-icteric. EARS, NOSE, MOUTH AND THROAT: The oropharynx is clear. The oral mucosa is pink and moist. Hearing is intact to voice. LYMPH NODES:  Lymph nodes in the neck are normal. RESPIRATORY:  Lungs are clear. There is normal respiratory effort, with equal breath sounds bilaterally, and without pathologic use of accessory muscles. CARDIOVASCULAR: Heart is regular without murmurs, gallops, or rubs. GI: The abdomen is  Soft and obese. Tender to palpation in the right lower quadrant with tenderness inMCburney's point. GU: Rectal deferred.   MUSCULOSKELETAL: Normal muscle strength and tone. No cyanosis or  edema.   SKIN: Turgor is good and there are no pathologic skin lesions or ulcers. NEUROLOGIC: Motor and sensation is grossly normal. Cranial nerves are grossly intact. PSYCH:  Oriented to person, place and time. Affect is normal.  Data Reviewed  I have personally reviewed the patient's imaging, laboratory findings and medical records.    Assessment/Plan 56 year old morbidly obese male with signs and symptoms consistent with acute cholecystitis confirmed by CT scan. Discussed with the patient in detail about his disease process. I do recommend cholecystectomy. I have reviewed his EKG and there is no acute coronary issues at this time. We'll go ahead and start him on some crystalloid bolus and broad-spectrum antibiotics and and perform an appendectomy today. The risks, benefits, complications, treatment  options, and expected outcomes were discussed with the patient. Discussed continuing to the operating room for Laparoscopic Appendectomy.  The possibilities of  bleeding, recurrent infection, perforation of viscus, finding a normal appendix, the need for additional procedures, failure to diagnose a condition, conversion to open procedure and creating a complication requiring transfusion or further operations were discussed. The patient was given the opportunity to ask questions and have them answered.  Patient would like to proceed with Laparoscopic Appendectomy and consent was obtained. D/w him in detail about risks inherent to his morbid obesity ( airway issues and increase complication rate)   Caroleen Hamman, MD FACS General Surgeon 10/30/2016, 9:50 AM

## 2016-10-30 NOTE — ED Notes (Signed)
Patient's oxygen saturation dropped to 88% on RA. Pt instructed to take deep breaths - O2 saturation did not increase. Pt placed on 2L [place]. Patient's O2 increased to 90%. MD informed. RN will continue to monitor.

## 2016-10-30 NOTE — Progress Notes (Signed)
  Patient seen and examined this evening. Patient reports being sore but otherwise doing okay. He is tolerating clear liquids. On exam his abdomen is very large, soft, nondistended. Wound VAC in place with good seal. A JP drain with serous segments output.  Discussed with the patient the operative findings and the anticipated hospital stay. All questions answered to his satisfaction.  Clayburn Pert, MD [facility]  [facility] [phone] Night [facility] [phone]

## 2016-10-30 NOTE — ED Notes (Signed)
Patient's O2 sat decreased to 89%. [name] [place] O2 increased to 4L.

## 2016-10-30 NOTE — Anesthesia Postprocedure Evaluation (Signed)
Anesthesia Post Note  Patient: Peter [name]  Procedure(s) Performed: Procedure(s) (LRB): APPENDECTOMY LAPAROSCOPIC changed  to open application of wound vac (N/A)  Patient location during evaluation: PACU Anesthesia Type: General Level of consciousness: awake and alert Pain management: pain level controlled Vital Signs Assessment: post-procedure vital signs reviewed and stable Respiratory status: spontaneous breathing, nonlabored ventilation, respiratory function stable and patient connected to nasal cannula oxygen Cardiovascular status: blood pressure returned to baseline and stable Postop Assessment: no signs of nausea or vomiting Anesthetic complications: no     Last Vitals:  Vitals:   10/30/16 1540 10/30/16 1545  BP:  (!) 153/88  Pulse: 88 88  Resp: (!) 26 (!) 24  Temp:      Last Pain:  Vitals:   10/30/16 1545  TempSrc:   PainSc: 7                  Precious Haws Piscitello

## 2016-10-30 NOTE — Anesthesia Post-op Follow-up Note (Cosign Needed)
Anesthesia QCDR form completed.        

## 2016-10-30 NOTE — Op Note (Signed)
Open appendectomy   Peter [name] Date of operation:  10/30/2016  Indications: The patient presented with a history of  abdominal pain. Workup has revealed findings consistent with acute appendicitis.  Pre-operative Diagnosis: Acute appendicitis without mention of peritonitis  Post-operative Diagnosis: Same  Surgeon: Caroleen Hamman, MD, FACS  Anesthesia: General with endotracheal tube  Findings: Acute retrocecal appendicitis non perforated Very difficult case due to body habitus and limited exposure  Estimated Blood Loss: 100cc         Specimens: appendix         Complications:  none  Procedure Details  The patient was seen again in the preop area. The options of surgery versus observation were reviewed with the patient and/or family. The risks of bleeding, infection, recurrence of symptoms, negative laparoscopy, potential for an open procedure, bowel injury, abscess or infection, were all reviewed as well. The patient was taken to Operating Room, identified as Peter [name] and the procedure verified as laparoscopic appendectomy. A Time Out was held and the above information confirmed.  The patient was placed in the supine position and general anesthesia was induced.  Antibiotic prophylaxis was administered and VT E prophylaxis was in place. A Foley catheter was attemtped but could not be placed by the nursing staff.   The abdomen was prepped and draped in a sterile fashion. An infraumbilical incision was made. A cutdown technique was used to enter the abdominal cavity. Two vicryl stitches were placed on the fascia and a Hasson trocar inserted. Pneumoperitoneum obtained. Two 5 mm ports were placed under direct visualization.  There was significant problems with obtaining a good dissection window an adequate pneumoperitoneum to to his super morbid obesity. I used the bariatric trochars but they will even were not long enough to transverse abdominal wall. After significant is  struggling with exposure for at least 30 minutes I decided to go ahead and convert for a midline laparotomy. Template knife was used and electrocautery was used and 2 dissectors at the tendinous tissue and fascia was incised. And again even after an open procedure there was significant and exposure issues due to the body habitus. There was some adhesions from the omentum to the cecum and the appendix was retrocecal. We had to mobilize the cecum and right colon in order to have access to the appendix. This was done under direct resuscitation using electrocautery. With that once we have adequate exposure were able to identify the appendix and actually while mobilizing it fracture. We'll obtain proper control and were able to divide the mesoappendix with the Harmonic scalpel. The base of the appendix was grasped and a appendectomy was performed using the standard endo-stapler device.  Inspection  failed to identify any additional bleeding and there were no signs of bowel injury.  We irrigated the abdominal cavity and placed a #19 [place] Blake drain given the high risk for developing abscess. The fascia was closed with continuous #1 PDS and also replace multiple retention sutures and to prevent any potential evisceration. The and abdominal wall was infiltrated with X Prell as well as the fascia. Because of the large pannus I decided to put a wound VAC to feel that that space. The vac was placed in the standard fashion after applying mepitel dressing.  No leak was encounter. Needle and laparotomy counts were correct The patient was taken to the recovery room in stable condition to be admitted for continued care.    Caroleen Hamman, MD FACS

## 2016-10-30 NOTE — Transfer of Care (Signed)
Immediate Anesthesia Transfer of Care Note  Patient: Peter [name]  Procedure(s) Performed: Procedure(s): APPENDECTOMY LAPAROSCOPIC changed  to open application of wound vac (N/A)  Patient Location: PACU  Anesthesia Type:General  Level of Consciousness: awake, alert  and oriented  Airway & Oxygen Therapy: Patient Spontanous Breathing and Patient connected to face mask oxygen  Post-op Assessment: Report given to RN and Post -op Vital signs reviewed and stable  Post vital signs: Reviewed and stable  Last Vitals:  Vitals:   10/30/16 0930 10/30/16 1000  BP: (!) 144/78 (!) 147/84  Pulse: 69 (!) 52  Resp: (!) 23 (!) 21  Temp:      Last Pain:  Vitals:   10/30/16 1047  TempSrc:   PainSc: 8          Complications: No apparent anesthesia complications

## 2016-10-30 NOTE — Anesthesia Procedure Notes (Signed)
Procedure Name: Intubation Date/Time: 10/30/2016 11:25 AM Performed by: Jennette Bill Pre-anesthesia Checklist: Patient identified, Patient being monitored, Timeout performed, Emergency Drugs available and Suction available Patient Re-evaluated:Patient Re-evaluated prior to inductionOxygen Delivery Method: Circle system utilized Preoxygenation: Pre-oxygenation with 100% oxygen Intubation Type: IV induction Ventilation: Mask ventilation without difficulty Laryngoscope Size: McGraph and 3 Grade View: Grade II Tube type: Oral Tube size: 7.0 mm Number of attempts: 1 Airway Equipment and Method: Stylet Placement Confirmation: ETT inserted through vocal cords under direct vision,  positive ETCO2 and breath sounds checked- equal and bilateral Secured at: 23 cm Tube secured with: Tape Dental Injury: Teeth and Oropharynx as per pre-operative assessment

## 2016-10-30 NOTE — ED Notes (Signed)
Patient transported to CT 

## 2016-10-30 NOTE — ED Notes (Signed)
Orderly transferring patient at this time

## 2016-10-30 NOTE — Progress Notes (Signed)
Pt temp 100.3 at 17:35, Tylenol admin by nurse on that shift. Temp down to 98.9 at start of PM shift. Pt temp back 100.3 at 23:21, scheduled Tylenol and Toradol admin. MD notified.

## 2016-10-31 ENCOUNTER — Encounter: Payer: Self-pay | Admitting: Surgery

## 2016-10-31 LAB — CBC
HEMATOCRIT: 39.9 % — AB (ref 40.0–52.0)
HEMOGLOBIN: 13.6 g/dL (ref 13.0–18.0)
MCH: 30.3 pg (ref 26.0–34.0)
MCHC: 34.1 g/dL (ref 32.0–36.0)
MCV: 88.7 fL (ref 80.0–100.0)
PLATELETS: 160 10*3/uL (ref 150–440)
RBC: 4.5 MIL/uL (ref 4.40–5.90)
RDW: 13.3 % (ref 11.5–14.5)
WBC: 14.9 10*3/uL — AB (ref 3.8–10.6)

## 2016-10-31 LAB — BASIC METABOLIC PANEL
ANION GAP: 8 (ref 5–15)
BUN: 23 mg/dL — ABNORMAL HIGH (ref 6–20)
CHLORIDE: 102 mmol/L (ref 101–111)
CO2: 26 mmol/L (ref 22–32)
Calcium: 8.2 mg/dL — ABNORMAL LOW (ref 8.9–10.3)
Creatinine, Ser: 0.99 mg/dL (ref 0.61–1.24)
GFR calc Af Amer: >60 mL/min (ref >60)
Glucose, Bld: 127 mg/dL — ABNORMAL HIGH (ref 65–99)
POTASSIUM: 3.2 mmol/L — AB (ref 3.5–5.1)
Sodium: 136 mmol/L (ref 135–145)

## 2016-10-31 MED ORDER — SODIUM CHLORIDE 0.9 % IV SOLN
30.0000 meq | Freq: Once | INTRAVENOUS | Status: AC
  Administered 2016-10-31: 30 meq via INTRAVENOUS
  Filled 2016-10-31: qty 15

## 2016-10-31 MED ORDER — CYCLOBENZAPRINE HCL 10 MG PO TABS
5.0000 mg | ORAL_TABLET | Freq: Three times a day (TID) | ORAL | Status: DC
  Administered 2016-10-31 – 2016-11-06 (×20): 5 mg via ORAL
  Filled 2016-10-31 (×20): qty 1

## 2016-10-31 MED ORDER — PROMETHAZINE HCL 25 MG/ML IJ SOLN
25.0000 mg | Freq: Four times a day (QID) | INTRAMUSCULAR | Status: DC | PRN
Start: 2016-10-31 — End: 2016-11-04
  Administered 2016-10-31 – 2016-11-03 (×2): 25 mg via INTRAVENOUS
  Filled 2016-10-31 (×2): qty 1

## 2016-10-31 MED ORDER — GABAPENTIN 600 MG PO TABS
600.0000 mg | ORAL_TABLET | Freq: Three times a day (TID) | ORAL | Status: DC
  Administered 2016-10-31 – 2016-11-06 (×20): 600 mg via ORAL
  Filled 2016-10-31 (×20): qty 1

## 2016-10-31 NOTE — Progress Notes (Signed)
Patient had decreased appetite and experienced nausea. Patient given Zofran. Nausea improved following intervention. Patient resting in bed. Continue assess.

## 2016-10-31 NOTE — Progress Notes (Signed)
CC: POD # 1 open appy  Subjective: Low grade temp c/o some pina Good U/O Hb stable creat ok  Objective: Vital signs in last 24 hours: Temp:  [97.7 F (36.5 C)-100.3 F (37.9 C)] 98.4 F (36.9 C) (04/29 1058) Pulse Rate:  [79-92] 79 (04/29 1052) Resp:  [14-22] 20 (04/29 0432) BP: (123-165)/(52-85) 152/81 (04/29 1052) SpO2:  [93 %-96 %] 96 % (04/29 0432) Last BM Date: 10/30/16  Intake/Output from previous day: 04/28 0701 - 04/29 0700 In: 3885 [P.O.:60; I.V.:3775; IV Piggyback:50] Out: 390 [Urine:250; Drains:140] Intake/Output this shift: Total I/O In: 1475 [I.V.:1160; IV Piggyback:315] Out: 130 [Drains:130]  Physical exam: NAD,  Abd Soft, wound vac in place. JP serosanguinous. No peritonitis   Lab Results: CBC   Recent Labs  10/30/16 0407 10/31/16 0416  WBC 12.4* 14.9*  HGB 14.8 13.6  HCT 42.5 39.9*  PLT 168 160   BMET  Recent Labs  10/30/16 0407 10/31/16 0416  NA 140 136  K 3.5 3.2*  CL 106 102  CO2 27 26  GLUCOSE 127* 127*  BUN 20 23*  CREATININE 0.69 0.99  CALCIUM 8.9 8.2*   PT/INR No results for input(s): LABPROT, INR in the last 72 hours. ABG No results for input(s): PHART, HCO3 in the last 72 hours.  Invalid input(s): PCO2, PO2  Studies/Results: Ct Abdomen Pelvis W Contrast  Result Date: 10/30/2016 CLINICAL DATA:  56 year old male with abdominal pain, nausea vomiting. EXAM: CT ABDOMEN AND PELVIS WITH CONTRAST TECHNIQUE: Multidetector CT imaging of the abdomen and pelvis was performed using the standard protocol following bolus administration of intravenous contrast. CONTRAST:  118mL ISOVUE-300 IOPAMIDOL (ISOVUE-300) INJECTION 61% COMPARISON:  None. FINDINGS: Lower chest: There is mild eventration of the right hemidiaphragm with minimal bibasilar linear atelectasis/ scarring. A 5 mm subpleural nodule noted along the right major fissure (series 2 image 3). There is no intra-abdominal free air or free fluid. Hepatobiliary: Cholecystectomy. There  is mild intrahepatic biliary ductal dilatation, likely post cholecystectomy. There is normal tapering of the CBD at the head of the pancreas. No retained calcified CBD stones noted. The liver is unremarkable. Pancreas: Unremarkable. No pancreatic ductal dilatation or surrounding inflammatory changes. Spleen: Normal in size without focal abnormality. Adrenals/Urinary Tract: There is linear calcification of the left adrenal gland, likely sequela of prior infection or hemorrhage. A 13 mm ill-defined hypodense nodule in the left adrenal gland is not well characterized but may represent an adenoma or scarring related to prior insult. MRI may provide better characterisation of the adrenal gland. The right adrenal gland appears unremarkable. There is a 15 mm left renal parapelvic cyst. The kidneys are otherwise unremarkable. There is homogeneous and symmetric uptake and enhancement of the kidneys and excretion of the contrast into the collecting system bilaterally. The visualized ureters and urinary bladder appear unremarkable. Stomach/Bowel: There is diverticulosis of the colon primarily involving the transverse colon. No active inflammatory changes. There is abutment of a short segment of small bowel to the transverse colon likely representing adhesions, likely sequela of prior diverticulitis. There is no evidence of bowel obstruction. The appendix is enlarged and inflamed. Multiple appendicolith noted within the lumen of the appendix. The appendix is located in the right lower quadrant medial to the cecum. There is no drainable fluid collection/abscess or evidence of perforation. Vascular/Lymphatic: No significant vascular findings are present. No enlarged abdominal or pelvic lymph nodes. Reproductive: The prostate and seminal vesicles are grossly unremarkable. Other: No abdominal wall hernia or abnormality. No abdominopelvic ascites. Musculoskeletal: Degenerative  changes of the spine. L5-S1 disc desiccation with vacuum  phenomena and facet arthropathy. No acute fracture. IMPRESSION: 1. Acute appendicitis.  No abscess or evidence of perforation. 2. Colonic diverticulosis without active inflammatory changes. No bowel obstruction. 3. Linear and coarse calcification of the left adrenal gland possibly sequela of prior insult. A 13 mm hypodense left adrenal nodule is not well characterized. MRI may provide better characterization. 4. Prior cholecystectomy. 5. A 5 mm right lung base subpleural nodule. Nonemergent CT of the chest may provide complete evaluation of the lungs if clinically indicated. Electronically Signed   By: Anner Crete M.D.   On: 10/30/2016 06:37    Anti-infectives: Anti-infectives    Start     Dose/Rate Route Frequency Ordered Stop   10/30/16 2000  piperacillin-tazobactam (ZOSYN) IVPB 3.375 g     3.375 g 12.5 mL/hr over 240 Minutes Intravenous Every 8 hours 10/30/16 1455     10/30/16 0830  piperacillin-tazobactam (ZOSYN) IVPB 3.375 g     3.375 g 100 mL/hr over 30 Minutes Intravenous  Once 10/30/16 0818 10/30/16 0905      Assessment/Plan: Expected course Anticipate possible ileus, keep on clears Continue A/BS until afebrile and wbc is nml Wound vac change T,T S PT consult Add flexeril and gabapentin for pain Caroleen Hamman, MD, FACS  10/31/2016

## 2016-11-01 ENCOUNTER — Inpatient Hospital Stay: Payer: 59

## 2016-11-01 LAB — BASIC METABOLIC PANEL
Anion gap: 4 — ABNORMAL LOW (ref 5–15)
BUN: 25 mg/dL — AB (ref 6–20)
CHLORIDE: 104 mmol/L (ref 101–111)
CO2: 29 mmol/L (ref 22–32)
CREATININE: 0.88 mg/dL (ref 0.61–1.24)
Calcium: 8.2 mg/dL — ABNORMAL LOW (ref 8.9–10.3)
GFR calc Af Amer: >60 mL/min (ref >60)
GFR calc non Af Amer: >60 mL/min (ref >60)
GLUCOSE: 87 mg/dL (ref 65–99)
Potassium: 3.2 mmol/L — ABNORMAL LOW (ref 3.5–5.1)
SODIUM: 137 mmol/L (ref 135–145)

## 2016-11-01 LAB — CBC
HCT: 35.8 % — ABNORMAL LOW (ref 40.0–52.0)
HEMOGLOBIN: 12.3 g/dL — AB (ref 13.0–18.0)
MCH: 30.5 pg (ref 26.0–34.0)
MCHC: 34.2 g/dL (ref 32.0–36.0)
MCV: 89.1 fL (ref 80.0–100.0)
Platelets: 137 10*3/uL — ABNORMAL LOW (ref 150–440)
RBC: 4.02 MIL/uL — ABNORMAL LOW (ref 4.40–5.90)
RDW: 13 % (ref 11.5–14.5)
WBC: 11.3 10*3/uL — ABNORMAL HIGH (ref 3.8–10.6)

## 2016-11-01 MED ORDER — ACETAMINOPHEN 500 MG PO TABS
1000.0000 mg | ORAL_TABLET | Freq: Once | ORAL | Status: AC
  Administered 2016-11-01: 1000 mg via ORAL
  Filled 2016-11-01: qty 2

## 2016-11-01 MED ORDER — LATANOPROST 0.005 % OP SOLN
1.0000 [drp] | Freq: Every day | OPHTHALMIC | Status: DC
  Administered 2016-11-03 – 2016-11-06 (×4): 1 [drp] via OPHTHALMIC
  Filled 2016-11-01: qty 2.5

## 2016-11-01 MED ORDER — SERTRALINE HCL 100 MG PO TABS
200.0000 mg | ORAL_TABLET | Freq: Every day | ORAL | Status: DC
  Administered 2016-11-02 – 2016-11-06 (×5): 200 mg via ORAL
  Filled 2016-11-01 (×5): qty 2

## 2016-11-01 MED ORDER — AMLODIPINE BESYLATE 10 MG PO TABS
10.0000 mg | ORAL_TABLET | Freq: Every day | ORAL | Status: DC
  Administered 2016-11-02 – 2016-11-06 (×5): 10 mg via ORAL
  Filled 2016-11-01 (×5): qty 1

## 2016-11-01 MED ORDER — NEBIVOLOL HCL 5 MG PO TABS
20.0000 mg | ORAL_TABLET | Freq: Every evening | ORAL | Status: DC
  Administered 2016-11-02 – 2016-11-05 (×4): 20 mg via ORAL
  Filled 2016-11-01 (×3): qty 4

## 2016-11-01 MED ORDER — POTASSIUM CHLORIDE CRYS ER 20 MEQ PO TBCR
40.0000 meq | EXTENDED_RELEASE_TABLET | Freq: Two times a day (BID) | ORAL | Status: DC
Start: 2016-11-01 — End: 2016-11-07
  Administered 2016-11-01 – 2016-11-06 (×11): 40 meq via ORAL
  Filled 2016-11-01 (×11): qty 2

## 2016-11-01 MED ORDER — LOSARTAN POTASSIUM 50 MG PO TABS
100.0000 mg | ORAL_TABLET | Freq: Every day | ORAL | Status: DC
  Administered 2016-11-02 – 2016-11-06 (×5): 100 mg via ORAL
  Filled 2016-11-01 (×5): qty 2

## 2016-11-01 MED ORDER — ENOXAPARIN SODIUM 40 MG/0.4ML ~~LOC~~ SOLN
40.0000 mg | Freq: Two times a day (BID) | SUBCUTANEOUS | Status: DC
  Administered 2016-11-01 – 2016-11-06 (×11): 40 mg via SUBCUTANEOUS
  Filled 2016-11-01 (×11): qty 0.4

## 2016-11-01 MED ORDER — IBUPROFEN 400 MG PO TABS
600.0000 mg | ORAL_TABLET | Freq: Three times a day (TID) | ORAL | Status: DC
  Administered 2016-11-01 – 2016-11-02 (×3): 600 mg via ORAL
  Filled 2016-11-01 (×3): qty 2

## 2016-11-01 MED ORDER — AMOXICILLIN-POT CLAVULANATE 875-125 MG PO TABS
1.0000 | ORAL_TABLET | Freq: Two times a day (BID) | ORAL | Status: DC
  Administered 2016-11-01 – 2016-11-02 (×2): 1 via ORAL
  Filled 2016-11-01 (×2): qty 1

## 2016-11-01 MED ORDER — IBUPROFEN 400 MG PO TABS
400.0000 mg | ORAL_TABLET | Freq: Once | ORAL | Status: AC
  Administered 2016-11-01: 400 mg via ORAL
  Filled 2016-11-01 (×2): qty 1

## 2016-11-01 NOTE — Care Management (Signed)
s/p open appy.  Patient lives at home with spouse. PCP Rosanna Randy.  Pharmacy [facility].  PT has assessed patient and recommends home health PT and bariatric walker.  Patient states that he has a cane in the home for ambulation.  Patient has  Wound vac in place.  Patient is currently requiring acute O2.  Plan for patient to discharge with wound vac in place.  Patient was provided with home health agency preference.  Patient states that he does not have an agency preference.  Patient states that it depends on what his copay is, whether or not he will agree to PT services. Heads up referral given to [facility] with Nara Visa.  RNCM following for discharge planning

## 2016-11-01 NOTE — Progress Notes (Signed)
MD notified of temperature, O2 sats, and Respiratory rate. Orders for chest x ray, IS, and to give an extra dose of tylenol. Temp and O2 are now improving. Will continue to monitor.

## 2016-11-01 NOTE — Progress Notes (Signed)
MD aware of pt lack of IV access. IV team attempted multiple times but did not get iv started. IV meds changed to PO.

## 2016-11-01 NOTE — Progress Notes (Signed)
Called due to report that RUA PIV obtained by ultrasound earlier today had infiltrated. RUA with grade 3 infiltrate. Bilateral edema in arms and hands. 2 unsuccessful attempts to restart in L hand. Recommend central line or PICC insertion for further IV needs.  Aldona Lento RN [facility]

## 2016-11-01 NOTE — Evaluation (Signed)
Physical Therapy Evaluation Patient Details Name: Peter [name] MRN: [id number] DOB: October 17, 1960 Today's Date: 11/01/2016   History of Present Illness  Peter [name] is a 56 y.o. male with a history of abdominal pain in the right lower quadrant that is moderate to severe in intensity and is intermittent. Pain is worsening with movement or cough. Pain is sharp and it does not radiate. He does have associated nausea and decreased appetite. No fevers no chills. CT scan showed evidence of dilated appendix with appendiceal inflammation. No evidence of perforation, no free air or abscess. Pt underwent open appendectomy and is POD#2 at time of initial evaluation  Clinical Impression  Pt admitted with above diagnosis. Pt currently with functional limitations due to the deficits listed below (see PT Problem List). Mobility is primarily limited by pain at this time. He requires modA+2 for bed mobility due to RLQ pain. Pt instructed in log rolling technique to help minimize pain. He requires CGA only for transfers and ambulation. He ambulates with mildly forward flexed posture due to abdominal pain in standing. Gait speed is slow but steady. Pt requires moderate UE support on rolling walker for added stability due to abdominal pain. Upon arrival pt removed from O2 and SaO2 drops to 88% at rest. Placed back on 2L/min O2 for ambulation however SaO2 drops to 83%. Increased O2 back to 3L/min and provided standing rest break. 2-3 minutes required for SaO2 to recover to 90%. Pt should be safe returning home with [facility] PT but may need to sleep in a recliner initially due to abdominal pain with bed mobility. He would benefit from a bariatric rolling walker as well for added stability during ambulation. He requires utilizing walker during ambulation for a moderate amount of UE support to stabilize. Pt will benefit from skilled PT services to address deficits in strength, balance, and mobility in order to return to full  function at home.      Follow Up Recommendations Home health PT;Other (comment) (May need to sleep in recliner initially)    Equipment Recommendations  Rolling walker with 5" wheels;Other (comment) (Needs bariatric size)    Recommendations for Other Services       Precautions / Restrictions Precautions Precautions: Fall Restrictions Weight Bearing Restrictions: No      Mobility  Bed Mobility Overal bed mobility: Needs Assistance Bed Mobility: Supine to Sit     Supine to sit: Mod assist;+2 for physical assistance     General bed mobility comments: Pt is limited with bed mobility secondary to pain. Requires considerable assist for log rolling technique and to move from L sidelying position to sitting. Once upright demonstrates good static balance  Transfers Overall transfer level: Needs assistance Equipment used: Rolling walker (2 wheeled) (Bariatric) Transfers: Sit to/from Stand Sit to Stand: +2 safety/equipment;Min guard         General transfer comment: Pt performs transfers slowly secondary to abdominal pain. However he requires no external assist and once upright is steady in standing with bilateral UE support on rolling walker  Ambulation/Gait Ambulation/Gait assistance: Min guard;+2 safety/equipment Ambulation Distance (Feet): 150 Feet Assistive device: Rolling walker (2 wheeled) (Bariatric) Gait Pattern/deviations: Decreased step length - right;Decreased step length - left Gait velocity: Decreased Gait velocity interpretation: <1.8 ft/sec, indicative of risk for recurrent falls General Gait Details: Pt ambulates with mildly forward flexed posture due to abdominal pain in standing. Gait speed is slow but steady. Pt requires moderate UE support on rolling walker for added stability due to abdominal  pain. Upon arrival pt removed from O2 and SaO2 drops to 88% at rest. Placed back on 2L/min O2 for ambulation however SaO2 drops to 83%. Increased O2 back to 3L/min and  provided standing rest break. 2-3 minutes required for SaO2 to recover to 90%. Pt denies DOE but does demonstrate mild fatigue with ambulation.   Stairs            Wheelchair Mobility    Modified Rankin (Stroke Patients Only)       Balance Overall balance assessment: Needs assistance Sitting-balance support: No upper extremity supported Sitting balance-Leahy Scale: Good     Standing balance support: No upper extremity supported Standing balance-Leahy Scale: Fair Standing balance comment: Observed functionally. Not formally assessed due to pain.                             Pertinent Vitals/Pain Pain Assessment: 0-10 Pain Score: 4  Pain Location: RLQ abdomen at site of incision Pain Descriptors / Indicators: Dull Pain Intervention(s): Monitored during session;Premedicated before session    Home Living Family/patient expects to be discharged to:: Private residence Living Arrangements: Spouse/significant other Available Help at Discharge: Family Type of Home: House Home Access: Stairs to enter Entrance Stairs-Rails: None Technical brewer of Steps: 3 Home Layout: One level Home Equipment: Cane - single point;Grab bars - tub/shower (no walker, no BSC)      Prior Function Level of Independence: Independent         Comments: No falls in the last 12 months     Hand Dominance   Dominant Hand: Right    Extremity/Trunk Assessment   Upper Extremity Assessment Upper Extremity Assessment: Overall WFL for tasks assessed    Lower Extremity Assessment Lower Extremity Assessment: Generalized weakness (Limited due to pain)       Communication   Communication: No difficulties  Cognition Arousal/Alertness: Awake/alert Behavior During Therapy: WFL for tasks assessed/performed Overall Cognitive Status: Within Functional Limits for tasks assessed                                        General Comments      Exercises      Assessment/Plan    PT Assessment Patient needs continued PT services  PT Problem List Decreased strength;Decreased activity tolerance;Decreased balance;Decreased mobility;Obesity;Pain       PT Treatment Interventions DME instruction;Gait training;Stair training;Functional mobility training;Therapeutic activities;Therapeutic exercise;Balance training;Neuromuscular re-education;Patient/family education    PT Goals (Current goals can be found in the Care Plan section)  Acute Rehab PT Goals Patient Stated Goal: Return to prior function PT Goal Formulation: With patient Time For Goal Achievement: 11/15/16 Potential to Achieve Goals: Good    Frequency Min 2X/week   Barriers to discharge Inaccessible home environment 3 steps to enter and no handrails    Co-evaluation               End of Session Equipment Utilized During Treatment: Gait belt Activity Tolerance: Patient limited by pain Patient left: in chair;with call bell/phone within reach;with chair alarm set;with SCD's reapplied;Other (comment) (wound vac reapplied) Nurse Communication: Mobility status;Other (comment) (SaO2 readings with exertion) PT Visit Diagnosis: Muscle weakness (generalized) (M62.81);Other abnormalities of gait and mobility (R26.89)    Time: 0825-0908 PT Time Calculation (min) (ACUTE ONLY): 43 min   Charges:   PT Evaluation $PT Eval Moderate Complexity: 1 Procedure PT Treatments $  Gait Training: 8-22 mins   PT G Codes:        Lyndel Safe Marshall Kampf PT, DPT    Jeromie Gainor 11/01/2016, 10:33 AM

## 2016-11-01 NOTE — Progress Notes (Signed)
[facility] Hospital Day(s): 2.   Post op day(s): 2 Days Post-Op.   Interval History: Patient seen and examined, no acute events or new complaints overnight. Patient reports pain mild/well-controlled with decreased appetite, mild nausea, denies flatus or BM, N/V, fever/chills, CP, or SOB.  Review of Systems:  Constitutional: denies fever, chills  HEENT: denies cough or congestion  Respiratory: denies any shortness of breath  Cardiovascular: denies chest pain or palpitations  Gastrointestinal: abdominal pain, N/V, and bowel function as per interval history Genitourinary: denies burning with urination or urinary frequency Musculoskeletal: denies pain, decreased motor or sensation Integumentary: denies any other rashes or skin discolorations Neurological: denies HA or vision/hearing changes   Vital signs in last 24 hours: [min-max] current  Temp:  [98.4 F (36.9 C)-100 F (37.8 C)] 98.4 F (36.9 C) (04/30 0522) Pulse Rate:  [70-85] 70 (04/30 0522) Resp:  [20] 20 (04/30 0522) BP: (127-152)/(67-81) 127/67 (04/30 0522) SpO2:  [95 %-98 %] 98 % (04/30 0522)     Height: 5\' 7"  (170.2 cm) Weight: (!) 310 lb (140.6 kg) BMI (Calculated): 48.7   Intake/Output this shift:  No intake/output data recorded.   Intake/Output last 2 shifts:  @IOLAST2SHIFTS @   Physical Exam:  Constitutional: alert, cooperative and no distress  HENT: normocephalic without obvious abnormality  Eyes: PERRL, EOM's grossly intact and symmetric  Neuro: CN II - XII grossly intact and symmetric without deficit  Respiratory: breathing non-labored at rest  Cardiovascular: regular rate and sinus rhythm  Gastrointestinal: soft, obese, negligibly tender to deep palpation, and no appreciable distention with incisions well-approximated without erythema or drainage, midline abdominal wound negative pressure dressing and JP drains well-secured with serosanguinous drainage Musculoskeletal: UE and LE FROM, motor  and sensation grossly intact, NT   Labs:  CBC Latest Ref Rng & Units 11/01/2016 10/31/2016 10/30/2016  WBC 3.8 - 10.6 K/uL 11.3(H) 14.9(H) 12.4(H)  Hemoglobin 13.0 - 18.0 g/dL 12.3(L) 13.6 14.8  Hematocrit 40.0 - 52.0 % 35.8(L) 39.9(L) 42.5  Platelets 150 - 440 K/uL 137(L) 160 168   BMP:  Lab Results  Component Value Date   GLUCOSE 87 11/01/2016   CO2 29 11/01/2016   BUN 25 (H) 11/01/2016   BUN 21 06/17/2016   CREATININE 0.88 11/01/2016   CALCIUM 8.2 (L) 11/01/2016     Imaging studies: No new pertinent imaging studies   Assessment/Plan: (ICD-10's: K35.2) 56 y.o. male doing overall well with decreasing post-op leukocytosis 2 Days Post-Op s/p open appendectomy with placement of negative pressure wound VAC and JP drain for acute appendicitis, complicated by pertinent comorbidities including morbid obesity (BMI 49), HTN, and GERD with PUD.   - pain control prn   - continue clear liquids diet for now  - monitor bowel function and abdominal exam  - follow-up morning WBC/leukocytosis  - ambulation encouraged   All of the above findings and recommendations were discussed with the patient, and all of patient's questions were answered to his expressed satisfaction.  -- Marilynne Drivers Rosana Hoes, MD, [place]: [facility] - Partnering for exceptional care. Office: [phone]

## 2016-11-01 NOTE — Progress Notes (Signed)
Anticoagulation monitoring(Lovenox):  56yo  M ordered Lovenox 40 mg Q24h  Filed Weights   10/30/16 0356  Weight: (!) 310 lb (140.6 kg)   BMI 48.7   Lab Results  Component Value Date   CREATININE 0.88 11/01/2016   CREATININE 0.99 10/31/2016   CREATININE 0.69 10/30/2016   Estimated Creatinine Clearance: 124.1 mL/min (by C-G formula based on SCr of 0.88 mg/dL). Hemoglobin & Hematocrit     Component Value Date/Time   HGB 12.3 (L) 11/01/2016 0403   HCT 35.8 (L) 11/01/2016 0403   HCT 44.2 06/17/2016 1347     Per Protocol for Patient with estCrcl > 30 ml/min and BMI > 40, will transition to Lovenox 40 mg Q12h.     Chinita Greenland PharmD Clinical Pharmacist 11/01/2016

## 2016-11-01 NOTE — Progress Notes (Signed)
Pt.'s temperature at 1730 was 101.1 oral. Schedule tylenol 1000 mg was given to pt. RN rechecked temperature at 1845 and was 102.1 and pt states that he "does not feel right." Prime doctor was notified new orders for ibuprofen 400 mg PO once and blood cultures. RN will relay to oncoming nurse to reassess temperature from ibuprofen administration. Will continue to monitor pt.   Peter [name] [employer]

## 2016-11-02 LAB — CBC WITH DIFFERENTIAL/PLATELET
BASOS ABS: 0 10*3/uL (ref 0–0.1)
Basophils Relative: 0 %
Eosinophils Absolute: 0.1 10*3/uL (ref 0–0.7)
Eosinophils Relative: 1 %
HEMATOCRIT: 38.3 % — AB (ref 40.0–52.0)
HEMOGLOBIN: 12.9 g/dL — AB (ref 13.0–18.0)
LYMPHS PCT: 7 %
Lymphs Abs: 1.1 10*3/uL (ref 1.0–3.6)
MCH: 29.5 pg (ref 26.0–34.0)
MCHC: 33.8 g/dL (ref 32.0–36.0)
MCV: 87.2 fL (ref 80.0–100.0)
Monocytes Absolute: 1 10*3/uL (ref 0.2–1.0)
Monocytes Relative: 6 %
NEUTROS PCT: 86 %
Neutro Abs: 13.8 10*3/uL — ABNORMAL HIGH (ref 1.4–6.5)
Platelets: 175 10*3/uL (ref 150–440)
RBC: 4.39 MIL/uL — ABNORMAL LOW (ref 4.40–5.90)
RDW: 13 % (ref 11.5–14.5)
WBC: 16 10*3/uL — AB (ref 3.8–10.6)

## 2016-11-02 LAB — BASIC METABOLIC PANEL
Anion gap: 8 (ref 5–15)
BUN: 28 mg/dL — ABNORMAL HIGH (ref 6–20)
CALCIUM: 8.4 mg/dL — AB (ref 8.9–10.3)
CO2: 27 mmol/L (ref 22–32)
CREATININE: 1.01 mg/dL (ref 0.61–1.24)
Chloride: 102 mmol/L (ref 101–111)
GFR calc non Af Amer: >60 mL/min (ref >60)
GLUCOSE: 98 mg/dL (ref 65–99)
Potassium: 3.2 mmol/L — ABNORMAL LOW (ref 3.5–5.1)
Sodium: 137 mmol/L (ref 135–145)

## 2016-11-02 LAB — SURGICAL PATHOLOGY

## 2016-11-02 LAB — MAGNESIUM: Magnesium: 2 mg/dL (ref 1.7–2.4)

## 2016-11-02 LAB — HIV ANTIBODY (ROUTINE TESTING W REFLEX): HIV SCREEN 4TH GENERATION: NONREACTIVE

## 2016-11-02 MED ORDER — ACETAMINOPHEN 325 MG PO TABS
650.0000 mg | ORAL_TABLET | Freq: Four times a day (QID) | ORAL | Status: DC | PRN
  Administered 2016-11-06: 650 mg via ORAL
  Filled 2016-11-02: qty 2

## 2016-11-02 MED ORDER — SODIUM CHLORIDE 0.9% FLUSH
10.0000 mL | INTRAVENOUS | Status: DC | PRN
Start: 2016-11-02 — End: 2016-11-07

## 2016-11-02 MED ORDER — LACTATED RINGERS IV SOLN
INTRAVENOUS | Status: DC
  Administered 2016-11-02 – 2016-11-04 (×6): via INTRAVENOUS

## 2016-11-02 MED ORDER — IBUPROFEN 400 MG PO TABS
600.0000 mg | ORAL_TABLET | Freq: Three times a day (TID) | ORAL | Status: DC | PRN
  Administered 2016-11-05 – 2016-11-07 (×4): 600 mg via ORAL
  Filled 2016-11-02 (×4): qty 2

## 2016-11-02 MED ORDER — LACTATED RINGERS IV BOLUS (SEPSIS)
500.0000 mL | Freq: Once | INTRAVENOUS | Status: AC
  Administered 2016-11-02: 500 mL via INTRAVENOUS

## 2016-11-02 MED ORDER — SODIUM CHLORIDE 0.9% FLUSH
10.0000 mL | Freq: Two times a day (BID) | INTRAVENOUS | Status: DC
  Administered 2016-11-02 – 2016-11-06 (×6): 10 mL

## 2016-11-02 MED ORDER — PIPERACILLIN-TAZOBACTAM 4.5 G IVPB
4.5000 g | Freq: Three times a day (TID) | INTRAVENOUS | Status: DC
  Administered 2016-11-02 – 2016-11-05 (×9): 4.5 g via INTRAVENOUS
  Filled 2016-11-02 (×11): qty 100

## 2016-11-02 MED ORDER — OXYCODONE HCL 5 MG PO TABS
5.0000 mg | ORAL_TABLET | ORAL | Status: DC | PRN
  Administered 2016-11-03 – 2016-11-06 (×16): 10 mg via ORAL
  Administered 2016-11-07: 5 mg via ORAL
  Administered 2016-11-07 (×2): 10 mg via ORAL
  Filled 2016-11-02 (×20): qty 2

## 2016-11-02 MED ORDER — PIPERACILLIN-TAZOBACTAM 3.375 G IVPB: 3.3750 g | Freq: Three times a day (TID) | INTRAVENOUS | Status: DC

## 2016-11-02 NOTE — Progress Notes (Signed)
ANTIBIOTIC CONSULT NOTE - INITIAL  Pharmacy Consult for Zosyn  Indication: intra-abdominal infection  Allergies  Allergen Reactions  . Isosorbide     Headache, nausea, vomiting    Patient Measurements: Height: 5\' 7"  (170.2 cm) Weight: (!) 310 lb (140.6 kg) IBW/kg (Calculated) : 66.1 Adjusted Body Weight:   Vital Signs: Temp: 98 F (36.7 C) (05/01 1236) Temp Source: Oral (05/01 1236) BP: 147/75 (05/01 1236) Pulse Rate: 82 (05/01 1236) Intake/Output from previous day: 04/30 0701 - 05/01 0700 In: 2511.5 [P.O.:750; I.V.:1669.5; IV Piggyback:92] Out: 1330 [Urine:875; Drains:455] Intake/Output from this shift: No intake/output data recorded.  Labs:  Recent Labs  10/31/16 0416 11/01/16 0403 11/02/16 0424  WBC 14.9* 11.3* 16.0*  HGB 13.6 12.3* 12.9*  PLT 160 137* 175  CREATININE 0.99 0.88 1.01   Estimated Creatinine Clearance: 108.1 mL/min (by C-G formula based on SCr of 1.01 mg/dL). No results for input(s): VANCOTROUGH, VANCOPEAK, VANCORANDOM, GENTTROUGH, GENTPEAK, GENTRANDOM, TOBRATROUGH, TOBRAPEAK, TOBRARND, AMIKACINPEAK, AMIKACINTROU, AMIKACIN in the last 72 hours.   Microbiology: Recent Results (from the past 720 hour(s))  CULTURE, BLOOD (ROUTINE X 2) w Reflex to ID Panel     Status: None (Preliminary result)   Collection Time: 11/01/16  7:50 PM  Result Value Ref Range Status   Specimen Description BLOOD LT [facility]  Final   Special Requests Blood Culture adequate volume  Final   Culture NO GROWTH < 12 HOURS  Final   Report Status PENDING  Incomplete  CULTURE, BLOOD (ROUTINE X 2) w Reflex to ID Panel     Status: None (Preliminary result)   Collection Time: 11/01/16  8:04 PM  Result Value Ref Range Status   Specimen Description BLOOD LEFT FOREARM  Final   Special Requests Blood Culture adequate volume  Final   Culture NO GROWTH < 12 HOURS  Final   Report Status PENDING  Incomplete    Medical History: Past Medical History:  Diagnosis Date  . Blood in stool   .  Difficult intubation   . Glaucoma    since 2004  . Hypertension    since 2000  . Obesity, unspecified   . Ulcer 2011   gastric  . Unspecified hemorrhoids without mention of complication     Medications:  Prescriptions Prior to Admission  Medication Sig Dispense Refill Last Dose  . amLODipine (NORVASC) 10 MG tablet TAKE 1 TABLET BY MOUTH EVERY DAY 90 tablet 2 10/29/2016 at Unknown time  . aspirin EC 81 MG tablet Take 81 mg by mouth daily.   10/29/2016 at Unknown time  . atorvastatin (LIPITOR) 40 MG tablet Take 1 tablet (40 mg total) by mouth daily. (Patient taking differently: Take 40 mg by mouth every evening. ) 30 tablet 6 10/29/2016 at Unknown time  . clobetasol cream (TEMOVATE) 9.32 % Apply 1 application topically at bedtime as needed.   10/29/2016 at Unknown time  . EQUETRO 200 MG CP12 12 hr capsule Take 400-600 mg by mouth 2 (two) times daily. Take 400 mg in the morning & 600 mg in the evening.  7 10/29/2016 at Unknown time  . fluticasone (FLONASE) 50 MCG/ACT nasal spray Place 2 sprays into both nostrils daily. (Patient taking differently: Place 2 sprays into both nostrils daily as needed for allergies. ) 16 g 6 10/29/2016 at Unknown time  . Garlic 671 MG TABS Take 100 mg by mouth 2 (two) times daily.    10/29/2016 at Unknown time  . hydrALAZINE (APRESOLINE) 25 MG tablet TAKE 1 TABLET(25 MG) BY MOUTH  THREE TIMES DAILY 90 tablet 1 10/29/2016 at Unknown time  . ibuprofen (ADVIL,MOTRIN) 200 MG tablet Take 800 mg by mouth 3 (three) times daily as needed for headache or moderate pain.   prn  . lamoTRIgine (LAMICTAL) 200 MG tablet Take 400 mg by mouth at bedtime.    10/29/2016 at Unknown time  . latanoprost (XALATAN) 0.005 % ophthalmic solution Place 1 drop into both eyes daily.   10/29/2016 at Unknown time  . losartan (COZAAR) 100 MG tablet TAKE 1 TABLET BY MOUTH EVERY DAY 90 tablet 2 10/29/2016 at Unknown time  . MAGNESIUM-OXIDE 400 (241.3 Mg) MG tablet Take 400 mg by mouth 2 (two) times daily.  12  10/29/2016 at Unknown time  . Nebivolol HCl 20 MG TABS Take 1 tablet (20 mg total) by mouth every evening. 30 tablet 5 10/29/2016 at Unknown time  . OMEGA-3 FATTY ACIDS PO Take 1 capsule by mouth 2 (two) times daily.    10/29/2016 at Unknown time  . potassium chloride SA (K-DUR,KLOR-CON) 20 MEQ tablet Take 2 tablets (40 mEq total) by mouth 3 (three) times daily. 180 tablet 4 10/29/2016 at Unknown time  . ranitidine (ZANTAC) 300 MG tablet TAKE 1 TABLET(300 MG) BY MOUTH TWICE DAILY 60 tablet 12 10/29/2016 at Unknown time  . RUTIN, MISC. NUTRITIONAL FACTORS, TAKE 1 TABLET (500 MG) BY MOUTH DAILY   10/29/2016 at Unknown time  . sertraline (ZOLOFT) 100 MG tablet TAKE 1 TABLET BY MOUTH TWICE DAILY (Patient taking differently: TAKE 2 TABLETS (200 MG) BY MOUTH DAILY) 180 tablet 2 10/29/2016 at Unknown time  . vitamin C (ASCORBIC ACID) 500 MG tablet Take 500 mg by mouth every evening.    10/29/2016 at Unknown time  . Vitamin D, Ergocalciferol, (DRISDOL) 50000 units CAPS capsule Take 1 capsule (50,000 Units total) by mouth every 7 (seven) days. 12 capsule 0 Past Week at Unknown time  . spironolactone (ALDACTONE) 50 MG tablet Take 1 tablet (50 mg total) by mouth daily. (Patient not taking: Reported on 09/21/2016) 30 tablet 12 Not Taking at Unknown time   Assessment: CrCl = 103.1 ml/min TBW = 140.6 kg   Goal of Therapy:  resolution of infection  Plan:  Expected duration 7 days with resolution of temperature and/or normalization of WBC   Zosyn 3.375 gm IV Q8H originally ordered.  Will adjust dose to Zosyn 4.5 gm IV Q8H EI based on TBW > 120 kg.   Tashawn Greff D 11/02/2016,3:12 PM

## 2016-11-02 NOTE — Progress Notes (Addendum)
MD notified that PICC placement has been completed at this time. New orders to give pt 500 ml bolus of LR, zosyn 4.5g IV Q8, and continuous LR at 125 m/hr. Will continue to monitor pt.   Gerard Cantara [employer]

## 2016-11-02 NOTE — Progress Notes (Signed)
[facility] Hospital Day(s): 3.   Post op day(s): 3 Days Post-Op.   Interval History: Patient seen and examined, experienced fever to 102 and loss of IV access overnight. Fever workup including blood cultures and CXR were obtained/performed accordingly. Though patient this morning was afebrile, he reported feeling terrible with ongoing only partially controlled pain, no flatus, no BM, little energy, and no appetite. After a large foul-smelling subcutaneous thrombus was evacuated during wound change, patient reports he felt substantially better, which improved further after IVF bolus via newly placed PICC, and further better after patient passed flatus and BM this afternoon. He otherwise denies further fever/chills or CP, SOB, or N/V. Also this afternoon, patient reports his appetite seems to likewise be improving.  Review of Systems:  Constitutional: denies fever, chills  HEENT: denies cough or congestion  Respiratory: denies any shortness of breath  Cardiovascular: denies chest pain or palpitations  Gastrointestinal: abdominal pain, N/V, and bowel function as per interval history Genitourinary: denies burning with urination or urinary frequency Musculoskeletal: denies pain, decreased motor or sensation Integumentary: denies any other rashes or skin discolorations Neurological: denies HA or vision/hearing changes   Vital signs in last 24 hours: [min-max] current  Temp:  [97.5 F (36.4 C)-102.4 F (39.1 C)] 97.5 F (36.4 C) (05/01 0347) Pulse Rate:  [85-97] 88 (05/01 0416) Resp:  [20-28] 20 (05/01 0347) BP: (133-152)/(61-78) 133/78 (05/01 0347) SpO2:  [84 %-96 %] 96 % (05/01 0416)     Height: 5\' 7"  (170.2 cm) Weight: (!) 310 lb (140.6 kg) BMI (Calculated): 48.7   Intake/Output this shift:  No intake/output data recorded.   Intake/Output last 2 shifts:  @IOLAST2SHIFTS @   Physical Exam:  Constitutional: alert, cooperative and no distress  HENT: normocephalic without  obvious abnormality  Eyes: PERRL, EOM's grossly intact and symmetric  Neuro: CN II - XII grossly intact and symmetric without deficit  Respiratory: breathing non-labored at rest  Cardiovascular: regular rate and sinus rhythm  Gastrointestinal: soft, morbidly obese, moderate Right-sided abdominal tenderness to palpation this morning and mild Right-sided abdominal tenderness to palpation, and non-distended  Musculoskeletal: UE and LE FROM, morbidly obese, motor and sensation grossly intact, NT   Labs:  CBC Latest Ref Rng & Units 11/02/2016 11/01/2016 10/31/2016  WBC 3.8 - 10.6 K/uL 16.0(H) 11.3(H) 14.9(H)  Hemoglobin 13.0 - 18.0 g/dL 12.9(L) 12.3(L) 13.6  Hematocrit 40.0 - 52.0 % 38.3(L) 35.8(L) 39.9(L)  Platelets 150 - 440 K/uL 175 137(L) 160   BMP Latest Ref Rng & Units 11/02/2016 11/01/2016 10/31/2016  Glucose 65 - 99 mg/dL 98 87 127(H)  BUN 6 - 20 mg/dL 28(H) 25(H) 23(H)  Creatinine 0.61 - 1.24 mg/dL 1.01 0.88 0.99  BUN/Creat Ratio 9 - 20 - - -  Sodium 135 - 145 mmol/L 137 137 136  Potassium 3.5 - 5.1 mmol/L 3.2(L) 3.2(L) 3.2(L)  Chloride 101 - 111 mmol/L 102 104 102  CO2 22 - 32 mmol/L 27 29 26   Calcium 8.9 - 10.3 mg/dL 8.4(L) 8.2(L) 8.2(L)     Imaging studies:  Chest x-ray (11/01/2016) Low lung volumes. Nonspecific patchy bibasilar lung opacities, left greater than right, favor atelectasis, difficult to exclude a component of aspiration or pneumonia.   Assessment/Plan: (ICD-10's: K35.2) 56 y.o. Male with fever, leukocytosis, and loss of IV access 3 Days Post-Op s/p open appendectomy with placement of negative pressure wound VAC and JP drain for acute appendicitis, complicated by pertinent comorbidities including morbid obesity (BMI 49), HTN, and GERD with PUD.              -  pain control prn (minimize narcotics)             - continue clear liquids diet for now, will likely advance tomorrow  - large foul-smelling subcutaneous thrombus evacuated and incision irrigated with no evidence  of fluid through intact fascia during wound VAC change this morning  - 500 mL bolus LR + LR at 125 mL/hr + replace potassium as soon as PICC placed  - weight loss discussed encouraged, will provide requested bariatric program info             - monitor ongoing bowel function and abdominal exam             - follow-up morning WBC/leukocytosis  - incentive spirometry encouraged             - ambulation encouraged   All of the above findings and recommendations were discussed with the patient, and all of patient's questions were answered to his expressed satisfaction.  -- Marilynne Drivers Rosana Hoes, MD, [place]: [facility] - Partnering for exceptional care. Office: [phone]

## 2016-11-02 NOTE — Progress Notes (Signed)
PT Cancellation Note  Patient Details Name: Peter [name] MRN: [id number] DOB: 06-28-1961   Cancelled Treatment:    Reason Eval/Treat Not Completed: Patient declined, no reason specified   Pt declined session today.  Will continue as appropriate.   Chesley Noon 11/02/2016, 11:06 AM

## 2016-11-02 NOTE — Progress Notes (Signed)
Peripherally Inserted Central Catheter/Midline Placement  The IV Nurse has discussed with the patient and/or persons authorized to consent for the patient, the purpose of this procedure and the potential benefits and risks involved with this procedure.  The benefits include less needle sticks, lab draws from the catheter, and the patient may be discharged home with the catheter. Risks include, but not limited to, infection, bleeding, blood clot (thrombus formation), and puncture of an artery; nerve damage and irregular heartbeat and possibility to perform a PICC exchange if needed/ordered by physician.  Alternatives to this procedure were also discussed.  Bard Power PICC patient education guide, fact sheet on infection prevention and patient information card has been provided to patient /or left at bedside.    PICC/Midline Placement Documentation  PICC Single Lumen 11/02/16 PICC Right Cephalic 49 cm 0 cm (Active)  Indication for Insertion or Continuance of Line Poor Vasculature-patient has had multiple peripheral attempts or PIVs lasting less than 24 hours 11/02/2016  2:50 PM  Exposed Catheter (cm) 0 cm 11/02/2016  2:50 PM  Site Assessment Clean;Dry;Intact 11/02/2016  2:50 PM  Line Status Flushed;Saline locked;Blood return noted 11/02/2016  2:50 PM  Dressing Type Transparent;Securing device 11/02/2016  2:50 PM  Dressing Status Clean;Dry;Intact;Antimicrobial disc in place 11/02/2016  2:50 PM  Dressing Change Due 11/09/16 11/02/2016  2:50 PM       Frances Maywood 11/02/2016, 2:53 PM

## 2016-11-03 ENCOUNTER — Encounter: Payer: Self-pay | Admitting: Family Medicine

## 2016-11-03 LAB — CBC WITH DIFFERENTIAL/PLATELET
BASOS PCT: 0 %
Basophils Absolute: 0 10*3/uL (ref 0–0.1)
EOS ABS: 0.4 10*3/uL (ref 0–0.7)
EOS PCT: 3 %
HCT: 36.4 % — ABNORMAL LOW (ref 40.0–52.0)
HEMOGLOBIN: 12.5 g/dL — AB (ref 13.0–18.0)
LYMPHS ABS: 0.7 10*3/uL — AB (ref 1.0–3.6)
Lymphocytes Relative: 6 %
MCH: 30.4 pg (ref 26.0–34.0)
MCHC: 34.4 g/dL (ref 32.0–36.0)
MCV: 88.4 fL (ref 80.0–100.0)
MONOS PCT: 6 %
Monocytes Absolute: 0.8 10*3/uL (ref 0.2–1.0)
NEUTROS PCT: 85 %
Neutro Abs: 11.2 10*3/uL — ABNORMAL HIGH (ref 1.4–6.5)
PLATELETS: 190 10*3/uL (ref 150–440)
RBC: 4.11 MIL/uL — AB (ref 4.40–5.90)
RDW: 13.2 % (ref 11.5–14.5)
WBC: 13.1 10*3/uL — AB (ref 3.8–10.6)

## 2016-11-03 LAB — BASIC METABOLIC PANEL
Anion gap: 5 (ref 5–15)
BUN: 30 mg/dL — AB (ref 6–20)
CO2: 30 mmol/L (ref 22–32)
CREATININE: 0.66 mg/dL (ref 0.61–1.24)
Calcium: 8.1 mg/dL — ABNORMAL LOW (ref 8.9–10.3)
Chloride: 100 mmol/L — ABNORMAL LOW (ref 101–111)
Glucose, Bld: 99 mg/dL (ref 65–99)
Potassium: 3.7 mmol/L (ref 3.5–5.1)
SODIUM: 135 mmol/L (ref 135–145)

## 2016-11-03 NOTE — Progress Notes (Signed)
Patient ambulated 80 feet in the hallway.  sats dropped to 86% without oxygen.  Patient placed back on oxygen.  sats around 93%

## 2016-11-03 NOTE — Progress Notes (Signed)
Physical Therapy Treatment Patient Details Name: Peter [name] MRN: [id number] DOB: 03/21/1961 Today's Date: 11/03/2016    History of Present Illness TRIP CAVANAGH is a 56 y.o. male with a history of abdominal pain in the right lower quadrant that is moderate to severe in intensity and is intermittent. Pain is worsening with movement or cough. Pain is sharp and it does not radiate. He does have associated nausea and decreased appetite. No fevers no chills. CT scan showed evidence of dilated appendix with appendiceal inflammation. No evidence of perforation, no free air or abscess. Pt underwent open appendectomy and is POD#2 at time of initial evaluation    PT Comments    Pt reports abdominal pain 5/10 and fatigue from activity this morning. Pt does not wish out of bed at this time. Agreeable to limited exercise/education. Pt performs 1 set of lower extremity exercises with education on technique, repetitions and frequency and importance of exercises throughout the day to prevent further strength and functional loss. Pt understands. Continue PT to progress strength, endurance to improve functional mobility to allow for an optimal, safe return home.    Follow Up Recommendations  Home health PT;Other (comment)     Equipment Recommendations  Rolling walker with 5" wheels;Other (comment) (bariatric)    Recommendations for Other Services       Precautions / Restrictions Precautions Precautions: Fall Restrictions Weight Bearing Restrictions: No    Mobility  Bed Mobility               General bed mobility comments: Not tested; pt repots a lot of activity this morn with self hygiene tasks and walking aound nurses station. Refuses out of bed at this time  Transfers                    Ambulation/Gait                 Stairs            Wheelchair Mobility    Modified Rankin (Stroke Patients Only)       Balance                                             Cognition Arousal/Alertness: Awake/alert (reports fatigued) Behavior During Therapy: WFL for tasks assessed/performed Overall Cognitive Status: Within Functional Limits for tasks assessed                                        Exercises General Exercises - Lower Extremity Ankle Circles/Pumps: AROM;Both;10 reps;Supine Quad Sets: Strengthening;Both;10 reps;Supine Gluteal Sets: Strengthening;Both;10 reps;Supine Long Arc Quad: AROM;Both;10 reps;Supine Heel Slides: AROM;Both;10 reps;Supine (limited range) Hip ABduction/ADduction: AAROM;Both;10 reps;Supine    General Comments        Pertinent Vitals/Pain Pain Assessment: 0-10 Pain Score: 5  Pain Location: Abdomen Pain Intervention(s): Premedicated before session;Limited activity within patient's tolerance    Home Living                      Prior Function            PT Goals (current goals can now be found in the care plan section) Progress towards PT goals: Progressing toward goals    Frequency    Min 2X/week  PT Plan Current plan remains appropriate    Co-evaluation              AM-PAC PT "6 Clicks" Daily Activity  Outcome Measure  Difficulty turning over in bed (including adjusting bedclothes, sheets and blankets)?: Total Difficulty moving from lying on back to sitting on the side of the bed? : Total Difficulty sitting down on and standing up from a chair with arms (e.g., wheelchair, bedside commode, etc,.)?: A Lot Help needed moving to and from a bed to chair (including a wheelchair)?: A Little Help needed walking in hospital room?: A Little Help needed climbing 3-5 steps with a railing? : A Lot 6 Click Score: 12    End of Session   Activity Tolerance: Patient limited by fatigue;Patient limited by pain Patient left: in bed;with call bell/phone within reach;with bed alarm set;with family/visitor present   PT Visit Diagnosis: Muscle weakness  (generalized) (M62.81);Other abnormalities of gait and mobility (R26.89)     Time: 5366-4403 PT Time Calculation (min) (ACUTE ONLY): 14 min  Charges:  $Therapeutic Exercise: 8-22 mins                    G Codes:        Larae Grooms, PTA 11/03/2016, 11:54 AM

## 2016-11-03 NOTE — Progress Notes (Addendum)
Dr Rosana Hoes informed that patient has not had a BM since 10/30/16.  The patient is passing gas and due to surgery laxatives are contraindicated at this time.  Dr Rosana Hoes also informed about bloody drainage on gauze that is around jp exit site

## 2016-11-03 NOTE — Progress Notes (Signed)
[place] Hospital Day(s): 4.   Post op day(s): 4 Days Post-Op.   Interval History: Patient seen and examined, no acute events or new complaints overnight. Patient reports his abdominal pain (primarily peri-incisional) has improved, and he has been passing flatus, denies fever/chills, N/V, CP, or SOB.  Review of Systems:  Constitutional: denies fever, chills  HEENT: denies cough or congestion  Respiratory: denies any shortness of breath  Cardiovascular: denies chest pain or palpitations  Gastrointestinal: abdominal pain, N/V, and bowel function as per interval history Genitourinary: denies burning with urination or urinary frequency Musculoskeletal: denies pain, decreased motor or sensation Integumentary: denies any other rashes or skin discolorations except post-surgical incisions Neurological: denies HA or vision/hearing changes   Vital signs in last 24 hours: [min-max] current  Temp:  [98 F (36.7 C)-99 F (37.2 C)] 98.5 F (36.9 C) (05/02 0523) Pulse Rate:  [73-86] 86 (05/02 0523) Resp:  [18-22] 18 (05/02 0523) BP: (145-152)/(75-84) 145/78 (05/02 0523) SpO2:  [92 %-96 %] 92 % (05/02 0523)     Height: 5\' 7"  (170.2 cm) Weight: (!) 310 lb (140.6 kg) BMI (Calculated): 48.7   Intake/Output this shift:  Total I/O In: 1827.9 [I.V.:1639.4; IV Piggyback:188.5] Out: 260 [Urine:250; Drains:10]   Intake/Output last 2 shifts:  @IOLAST2SHIFTS @   Physical Exam:  Constitutional: alert, cooperative and no distress  HENT: normocephalic without obvious abnormality  Eyes: PERRL, EOM's grossly intact and symmetric  Neuro: CN II - XII grossly intact and symmetric without deficit  Respiratory: breathing non-labored at rest  Cardiovascular: regular rate and sinus rhythm  Gastrointestinal: soft, morbidly obese, noticeably less peri-incisional abdominal tenderness to palpation, wound VAC intact, JP drain secured with small amount of serosanguinous drainage in  bulb Musculoskeletal: UE and LE FROM, motor and sensation grossly intact, NT   Labs:  CBC Latest Ref Rng & Units 11/03/2016 11/02/2016 11/01/2016  WBC 3.8 - 10.6 K/uL 13.1(H) 16.0(H) 11.3(H)  Hemoglobin 13.0 - 18.0 g/dL 12.5(L) 12.9(L) 12.3(L)  Hematocrit 40.0 - 52.0 % 36.4(L) 38.3(L) 35.8(L)  Platelets 150 - 440 K/uL 190 175 137(L)   BMP:  Lab Results  Component Value Date   GLUCOSE 99 11/03/2016   CO2 30 11/03/2016   BUN 30 (H) 11/03/2016   BUN 21 06/17/2016   CREATININE 0.66 11/03/2016   CALCIUM 8.1 (L) 11/03/2016     Imaging studies: No new pertinent imaging studies   Assessment/Plan:(ICD-10's: K35.2) 56 y.o.Malewith fever, leukocytosis, and loss of IV access 4 Days Post-Ops/p open appendectomy with placement of negative pressure wound VAC and JP drainfor acute appendicitis, complicated by pertinent comorbidities including morbid obesity (BMI 49), HTN, and GERD with PUD.  - pain control prn(minimize narcotics) - advanced to mechanical soft diet as tolerated             - weight loss encouraged, requested bariatric program info provided and discussed - monitor ongoing bowel function and abdominal exam - follow-up am WBC for decreasing leukocytosis             - incentive spirometry encouraged - ambulation + PT encouraged  All of the above findings and recommendations were discussed with the patient, and all of patient's questions were answered to his expressed satisfaction.  -- Marilynne Drivers Rosana Hoes, MD, [place]: [facility] - Partnering for exceptional care. Office: [phone]

## 2016-11-03 NOTE — Progress Notes (Signed)
Patient said he could not walk at this time.  He was given morphine and zofran earlier but will try to walk later this evening

## 2016-11-04 LAB — CBC
HEMATOCRIT: 35.2 % — AB (ref 40.0–52.0)
Hemoglobin: 12 g/dL — ABNORMAL LOW (ref 13.0–18.0)
MCH: 30.1 pg (ref 26.0–34.0)
MCHC: 34.1 g/dL (ref 32.0–36.0)
MCV: 88 fL (ref 80.0–100.0)
PLATELETS: 198 10*3/uL (ref 150–440)
RBC: 4 MIL/uL — ABNORMAL LOW (ref 4.40–5.90)
RDW: 13.3 % (ref 11.5–14.5)
WBC: 11.1 10*3/uL — AB (ref 3.8–10.6)

## 2016-11-04 NOTE — Progress Notes (Signed)
[place] Hospital Day(s): 5.   Post op day(s): 5 Days Post-Op.   Interval History: Patient seen and examined, no acute events or new complaints overnight. Patient reports he feels "better" with improved (decreased) abdominal pain, +flatus and +BM, tolerating small portions of soft diet without N/V, and walking in halls x 3 yesterday, denies fever/chills, CP, or SOB.  Review of Systems:  Constitutional: denies fever, chills  HEENT: denies cough or congestion  Respiratory: denies any shortness of breath  Cardiovascular: denies chest pain or palpitations  Gastrointestinal: abdominal pain, N/V, and bowel function as per interval history Genitourinary: denies burning with urination or urinary frequency Musculoskeletal: denies pain, decreased motor or sensation Integumentary: denies any other rashes or skin discolorations except lower midline laparotomy wound with VAC and JP drain Neurological: denies HA or vision/hearing changes   Vital signs in last 24 hours: [min-max] current  Temp:  [98.5 F (36.9 C)-99.3 F (37.4 C)] 98.5 F (36.9 C) (05/03 0546) Pulse Rate:  [82-92] 88 (05/03 0546) Resp:  [18-24] 18 (05/03 0546) BP: (128-146)/(57-78) 128/58 (05/03 0546) SpO2:  [86 %-98 %] 94 % (05/03 0546)     Height: 5\' 7"  (170.2 cm) Weight: (!) 310 lb (140.6 kg) BMI (Calculated): 48.7   Intake/Output this shift:  No intake/output data recorded.   Intake/Output last 2 shifts:  @IOLAST2SHIFTS @   Physical Exam:  Constitutional: alert, cooperative and no distress  HENT: normocephalic without obvious abnormality  Eyes: PERRL, EOM's grossly intact and symmetric  Neuro: CN II - XII grossly intact and symmetric without deficit  Respiratory: breathing non-labored at rest  Cardiovascular: regular rate and sinus rhythm  Gastrointestinal: soft, morbidly obese, noticeably less peri-incisional abdominal tenderness to palpation, 20 cm (length) x 12 cm (width) x 10 cm (depth)  mid-/lower- abdominal laparotomy negative pressure wound VAC intact (both before and after being changed) though foul-smelling smell from somewhat necrotic abdominal wall fat with no fluid/material through the intact fascia, JP drain secured with small amount of serosanguinous drainage Musculoskeletal: UE and LE FROM, no lower extremity wounds appreciated, motor and sensation grossly intact, NT   Labs:  CBC Latest Ref Rng & Units 11/04/2016 11/03/2016 11/02/2016  WBC 3.8 - 10.6 K/uL 11.1(H) 13.1(H) 16.0(H)  Hemoglobin 13.0 - 18.0 g/dL 12.0(L) 12.5(L) 12.9(L)  Hematocrit 40.0 - 52.0 % 35.2(L) 36.4(L) 38.3(L)  Platelets 150 - 440 K/uL 198 190 175   CMP Latest Ref Rng & Units 11/03/2016 11/02/2016 11/01/2016  Glucose 65 - 99 mg/dL 99 98 87  BUN 6 - 20 mg/dL 30(H) 28(H) 25(H)  Creatinine 0.61 - 1.24 mg/dL 0.66 1.01 0.88  Sodium 135 - 145 mmol/L 135 137 137  Potassium 3.5 - 5.1 mmol/L 3.7 3.2(L) 3.2(L)  Chloride 101 - 111 mmol/L 100(L) 102 104  CO2 22 - 32 mmol/L 30 27 29   Calcium 8.9 - 10.3 mg/dL 8.1(L) 8.4(L) 8.2(L)  Total Protein 6.5 - 8.1 g/dL - - -  Total Bilirubin 0.3 - 1.2 mg/dL - - -  Alkaline Phos 38 - 126 U/L - - -  AST 15 - 41 U/L - - -  ALT 17 - 63 U/L - - -   Imaging studies: No new pertinent imaging studies  Assessment/Plan:(ICD-10's: K35.2) 56 y.o.morbidly obese Malewith resolved fever and nearly resolvedleukocytosis, but foul odor from abdominal wall fat during wound VAC change despite no erythema or drainage from intact abdominal wall fascia 5 Days Post-Ops/p open appendectomy with placement of a 20 cm (length) x 12 cm (width) x  10 cm (depth) mid-/lower- abdominal laparotomy negative pressure wound VAC and JP drainfor acute appendicitis, complicated by pertinent comorbidities including morbid obesity (BMI 49), HTN, and GERD with PUD.  - pain control prn(minimize narcotics) - advanced to mechanical soft diet as tolerated, IVF discontinued -  weight loss strongly encouraged, discussed bariatric program info with patient and his wife as requested  - bowel regimen is not contra-indicated as stated in RN notes, but rather is not indicated for post-operative ileus  - considering foul odor from abdominal wall fat during wound VAC change (but no surrounding erythema or drainage from intact abdominal wall fascia), will check WBC once more and stop if normalizes + will plan for d/c home after [facility] dressing change Saturday, 5/5 - monitor ongoing bowel function and abdominal exam - follow-up am WBC for nearly resolved leukocytosis - incentive spirometry encouraged - ambulation + PT encouraged  All of the above findings and recommendations were discussed with the patient, and all of patient's questions were answered to his expressed satisfaction.  -- Marilynne Drivers Rosana Hoes, MD, [place]: [facility] - Partnering for exceptional care. Office: [phone]

## 2016-11-04 NOTE — Progress Notes (Signed)
Pt ambulated two laps around the nursing station with a total of 320 ft. Pt ambulated with 02-3L, O2 dropped to 88 % pt stopped and took a deep breath and the O2 went up to 91 %, still on 3L. Will continue to encourage ambulation.   Sharai Overbay [employer]

## 2016-11-04 NOTE — Plan of Care (Signed)
Problem: Bowel/Gastric: Goal: Will not experience complications related to bowel motility Outcome: Not Progressing Pts last BM was on 4/28. Previous shift nurse called the doctor for treatment regimen and it is contraindicated at this time.

## 2016-11-04 NOTE — Progress Notes (Signed)
Physical Therapy Treatment Patient Details Name: Peter [name] MRN: [id number] DOB: 05-Nov-1960 Today's Date: 11/04/2016    History of Present Illness Peter [name] is a 56 y.o. male with a history of abdominal pain in the right lower quadrant that is moderate to severe in intensity and is intermittent. Pain is worsening with movement or cough. Pain is sharp and it does not radiate. He does have associated nausea and decreased appetite. No fevers no chills. CT scan showed evidence of dilated appendix with appendiceal inflammation. No evidence of perforation, no free air or abscess. Pt underwent open appendectomy and is POD#2 at time of initial evaluation    PT Comments    Pt in recliner, wanting to get back to bed due to discomfort.  Nursing tech in room having difficulty transferring pt due to discomfort.  Pt stated he recently walked x 2 around unit with staff but was unable to at this time.  Assisted pt and tech to stand.  Required mod a x 2 with verbal cues for hand and foot placements.  Once standing, he was steady and able to turn to bed with min guard x 2.  Required mod a x 2 to return to supine.  Pt stated pain is limiting factor in his mobility.   Follow Up Recommendations  Home health PT;Other (comment)     Equipment Recommendations  Rolling walker with 5" wheels;Other (comment)    Recommendations for Other Services       Precautions / Restrictions Precautions Precautions: None Precaution Comments: drains Restrictions Weight Bearing Restrictions: No    Mobility  Bed Mobility Overal bed mobility: Needs Assistance Bed Mobility: Sit to Supine     Supine to sit: Mod assist;+2 for physical assistance        Transfers Overall transfer level: Needs assistance Equipment used: None Transfers: Sit to/from Stand Sit to Stand: Mod assist;+2 physical assistance         General transfer comment: Slow with increased assist due to  pain.  Ambulation/Gait Ambulation/Gait assistance: Min guard;+2 safety/equipment Ambulation Distance (Feet): 3 Feet Assistive device: 2 person hand held assist       General Gait Details: few steps back to bed.   Stairs            Wheelchair Mobility    Modified Rankin (Stroke Patients Only)       Balance Overall balance assessment: Needs assistance Sitting-balance support: No upper extremity supported Sitting balance-Leahy Scale: Good     Standing balance support: No upper extremity supported Standing balance-Leahy Scale: Fair                              Cognition Arousal/Alertness: Awake/alert Behavior During Therapy: WFL for tasks assessed/performed Overall Cognitive Status: Within Functional Limits for tasks assessed                                        Exercises      General Comments        Pertinent Vitals/Pain Pain Assessment: 0-10 Pain Score: 6  Pain Location: Abdomen Pain Descriptors / Indicators: Sore    Home Living                      Prior Function            PT Goals (current goals  can now be found in the care plan section) Progress towards PT goals: Progressing toward goals    Frequency    Min 2X/week      PT Plan Current plan remains appropriate    Co-evaluation              AM-PAC PT "6 Clicks" Daily Activity  Outcome Measure  Difficulty turning over in bed (including adjusting bedclothes, sheets and blankets)?: Total Difficulty moving from lying on back to sitting on the side of the bed? : Total Difficulty sitting down on and standing up from a chair with arms (e.g., wheelchair, bedside commode, etc,.)?: Total Help needed moving to and from a bed to chair (including a wheelchair)?: A Little Help needed walking in hospital room?: A Little Help needed climbing 3-5 steps with a railing? : A Lot 6 Click Score: 11    End of Session Equipment Utilized During Treatment:  Gait belt Activity Tolerance: Patient limited by fatigue;Patient limited by pain Patient left: in bed;with call bell/phone within reach;with bed alarm set;with family/visitor present;with nursing/sitter in room         Time: 0950-1000 PT Time Calculation (min) (ACUTE ONLY): 10 min  Charges:  $Therapeutic Activity: 8-22 mins                    G Codes:       Chesley Noon, PTA 11/04/16, 10:36 AM

## 2016-11-05 LAB — CBC WITH DIFFERENTIAL/PLATELET
BASOS ABS: 0.1 10*3/uL (ref 0–0.1)
BASOS PCT: 1 %
EOS ABS: 0.3 10*3/uL (ref 0–0.7)
Eosinophils Relative: 3 %
HEMATOCRIT: 33.5 % — AB (ref 40.0–52.0)
Hemoglobin: 11.7 g/dL — ABNORMAL LOW (ref 13.0–18.0)
Lymphocytes Relative: 10 %
Lymphs Abs: 1 10*3/uL (ref 1.0–3.6)
MCH: 30.3 pg (ref 26.0–34.0)
MCHC: 34.9 g/dL (ref 32.0–36.0)
MCV: 86.9 fL (ref 80.0–100.0)
MONO ABS: 0.9 10*3/uL (ref 0.2–1.0)
MONOS PCT: 9 %
NEUTROS ABS: 7.3 10*3/uL — AB (ref 1.4–6.5)
NEUTROS PCT: 77 %
Platelets: 223 10*3/uL (ref 150–440)
RBC: 3.85 MIL/uL — ABNORMAL LOW (ref 4.40–5.90)
RDW: 12.9 % (ref 11.5–14.5)
WBC: 9.5 10*3/uL (ref 3.8–10.6)

## 2016-11-05 MED ORDER — PIPERACILLIN-TAZOBACTAM 3.375 G IVPB
3.3750 g | Freq: Three times a day (TID) | INTRAVENOUS | Status: DC
  Administered 2016-11-05 – 2016-11-06 (×3): 3.375 g via INTRAVENOUS
  Filled 2016-11-05 (×7): qty 50

## 2016-11-05 NOTE — Progress Notes (Signed)
[place] Hospital Day(s): 6.   Post op day(s): 6 Days Post-Op.   Interval History: Patient seen and examined, no acute events or new complaints overnight. Patient reports his pain continues to slowly improve and he continues to tolerate increasing soft diet and ambulation, denies N/V, fever/chills, CP, or SOB.  Review of Systems:  Constitutional: denies fever, chills  HEENT: denies cough or congestion  Respiratory: denies any shortness of breath  Cardiovascular: denies chest pain or palpitations  Gastrointestinal: abdominal pain, N/V, and bowel function as per interval history Genitourinary: denies burning with urination or urinary frequency Musculoskeletal: denies pain, decreased motor or sensation Integumentary: denies any other rashes or skin discolorations except lower midline laparotomy wound with VAC and JP drain Neurological: denies HA or vision/hearing changes   Vital signs in last 24 hours: [min-max] current  Temp:  [98 F (36.7 C)-98.8 F (37.1 C)] 98.8 F (37.1 C) (05/04 0449) Pulse Rate:  [82-86] 82 (05/04 0449) Resp:  [18-20] 20 (05/04 0449) BP: (130-163)/(75-88) 141/75 (05/04 0449) SpO2:  [85 %-97 %] 96 % (05/04 0449)     Height: 5\' 7"  (170.2 cm) Weight: (!) 310 lb (140.6 kg) BMI (Calculated): 48.7   Intake/Output this shift:  No intake/output data recorded.   Intake/Output last 2 shifts:  @IOLAST2SHIFTS @   Physical Exam:  Constitutional: alert, cooperative and no distress  HENT: normocephalic without obvious abnormality  Eyes: PERRL, EOM's grossly intact and symmetric  Neuro: CN II - XII grossly intact and symmetric without deficit  Respiratory: breathing non-labored at rest  Cardiovascular: regular rate and sinus rhythm  Gastrointestinal: soft, morbidly obese, noticeably less peri-incisional abdominal tenderness to palpation, 20 cm (length) x 12 cm (width) x 10 cm (depth) mid-/lower- abdominal laparotomy negative pressure wound VAC intact,  JP drain secured with only a tiny amount of serosanguinous drainage Musculoskeletal: UE and LE FROM, motor and sensation grossly intact, NT   Labs:  CBC Latest Ref Rng & Units 11/04/2016 11/03/2016 11/02/2016  WBC 3.8 - 10.6 K/uL 11.1(H) 13.1(H) 16.0(H)  Hemoglobin 13.0 - 18.0 g/dL 12.0(L) 12.5(L) 12.9(L)  Hematocrit 40.0 - 52.0 % 35.2(L) 36.4(L) 38.3(L)  Platelets 150 - 440 K/uL 198 190 175   Imaging studies: No new pertinent imaging studies   Assessment/Plan:(ICD-10's: K35.2) 56 y.o.morbidly obese Malewith resolved fever and resolvedleukocytosis, though foul odor from abdominal wall fat during wound VAC change again yesterday despite no erythema or drainage from intact abdominal wall fascia 6 Days Post-Ops/p open appendectomy with placement of a 20 cm (length) x 12 cm (width) x 10 cm (depth) mid-/lower- abdominal laparotomy negative pressure wound VAC and JP drainfor acute appendicitis, complicated by pertinent comorbidities including morbid obesity (BMI 49), HTN, and GERD with PUD.  - pain control prn(minimize narcotics) - regular diet as tolerated, IVF discontinued - weight loss strongly encouraged, discussed bariatric program info with patient and his wife as requested             - bowel regimen is not contra-indicated as stated in RN notes, but rather is not indicated for now-resolved post-operative ileus  - will plan for d/c home after [facility] dressing change Saturday, 5/5 (may likely also remove JP drain) considering foul odor from abdominal wall fat during wound VAC change (but no surrounding erythema or drainage from intact abdominal wall fascia) and no recent drainage from JP - monitor ongoing bowel function and abdominal exam - incentive spirometry encouraged - ambulation +PT encouraged  All of the above findings and recommendations were discussed  with the patient and his wife, and all of patient's and his  wife's questions were answered to their expressed satisfaction.  -- Marilynne Drivers Rosana Hoes, MD, [place]: [facility] - Partnering for exceptional care. Office: [phone]

## 2016-11-05 NOTE — Progress Notes (Signed)
Pt placed on [place] C-4 CPAP. CPAP plugged into red outlet. 3L O2 in line.

## 2016-11-06 LAB — CULTURE, BLOOD (ROUTINE X 2)
Culture: NO GROWTH
Culture: NO GROWTH
SPECIAL REQUESTS: ADEQUATE
Special Requests: ADEQUATE

## 2016-11-06 LAB — CREATININE, SERUM
Creatinine, Ser: 0.47 mg/dL — ABNORMAL LOW (ref 0.61–1.24)
GFR calc non Af Amer: >60 mL/min (ref >60)

## 2016-11-06 MED ORDER — AMOXICILLIN-POT CLAVULANATE 875-125 MG PO TABS
1.0000 | ORAL_TABLET | Freq: Once | ORAL | Status: AC
  Administered 2016-11-06: 1 via ORAL
  Filled 2016-11-06: qty 1

## 2016-11-06 MED ORDER — AMOXICILLIN-POT CLAVULANATE 875-125 MG PO TABS: 1.0000 | ORAL_TABLET | Freq: Two times a day (BID) | ORAL | 0 refills | Status: DC

## 2016-11-06 MED ORDER — AMOXICILLIN-POT CLAVULANATE 875-125 MG PO TABS
1.0000 | ORAL_TABLET | Freq: Two times a day (BID) | ORAL | Status: DC
  Administered 2016-11-07: 1 via ORAL
  Filled 2016-11-06: qty 1

## 2016-11-06 MED ORDER — AMOXICILLIN-POT CLAVULANATE 875-125 MG PO TABS: 1.0000 | ORAL_TABLET | Freq: Two times a day (BID) | ORAL | Status: DC

## 2016-11-06 MED ORDER — OXYCODONE HCL 5 MG PO TABS: 5.0000 mg | ORAL_TABLET | ORAL | 0 refills | Status: DC | PRN

## 2016-11-06 NOTE — Care Management Note (Addendum)
Case Management Note  Patient Details  Name: Peter [name] MRN: [id number] Date of Birth: [birth year]  Subjective/Objective:     Referral to [place] for HH-PTand RN. Request for RN to see [name] at home tomorrow if possible.. Request for a Bariatric RW to be delivered to [name] room today by [place] before he is discharged to home. Request for a Home wound vac to be delivered to [name] today in his [facility] room 227. Dr Rosana Hoes is at bedside waiting to put home vac on [name].              Action/Plan:   Expected Discharge Date:                  Expected Discharge Plan:     In-House Referral:     Discharge planning Services     Post Acute Care Choice:    Choice offered to:     DME Arranged:    DME Agency:     HH Arranged:    HH Agency:     Status of Service:     If discussed at [facility] of Stay Meetings, dates discussed:    Additional Comments:  [name] A, RN 11/06/2016, 2:12 PM

## 2016-11-06 NOTE — Progress Notes (Signed)
PT Cancellation Note  Patient Details Name: Peter [name] MRN: [id number] DOB: 1961/01/29   Cancelled Treatment:    Reason Eval/Treat Not Completed: Other (comment) (Declined visit stating he is going home today).  Pt is following up with HHPT.   Ramond Dial 11/06/2016, 2:19 PM   Mee Hives, PT MS Acute Rehab Dept. Number: [place] and [place]

## 2016-11-06 NOTE — Progress Notes (Signed)
Pt placed on [facility] C-4 CPAP. CPAP plugged into red outlet with 3L O2 in line

## 2016-11-06 NOTE — Progress Notes (Signed)
[facility] Hospital Day(s): 7.   Post op day(s): 7 Days Post-Op.   Interval History: Patient seen and examined, no acute events or new complaints overnight. Patient reports his pain continues to slowly improve and he continues to tolerate increasing soft diet and ambulation, +flatus, +BM, denies N/V, fever/chills, CP, or SOB.  Review of Systems:  Constitutional: denies fever, chills  HEENT: denies cough or congestion  Respiratory: denies any shortness of breath  Cardiovascular: denies chest pain or palpitations  Gastrointestinal: abdominal pain, N/V, and bowel function as per interval history Genitourinary: denies burning with urination or urinary frequency Musculoskeletal: denies pain, decreased motor or sensation Integumentary: denies any other rashes or skin discolorations except post-surgical abdominal wound Neurological: denies HA or vision/hearing changes   Vital signs in last 24 hours: [min-max] current  Temp:  [98.3 F (36.8 C)-99.2 F (37.3 C)] 98.3 F (36.8 C) (05/05 0444) Pulse Rate:  [74-79] 79 (05/05 0444) Resp:  [18-20] 18 (05/05 0444) BP: (143-155)/(68-77) 149/73 (05/05 0444) SpO2:  [97 %-98 %] 97 % (05/05 0444)     Height: 5\' 7"  (170.2 cm) Weight: (!) 310 lb (140.6 kg) BMI (Calculated): 48.7   Intake/Output this shift:  No intake/output data recorded.   Intake/Output last 2 shifts:  @IOLAST2SHIFTS @   Physical Exam:  Constitutional: alert, cooperative and no distress  HENT: normocephalic without obvious abnormality  Eyes: PERRL, EOM's grossly intact and symmetric  Neuro: CN II - XII grossly intact and symmetric without deficit  Respiratory: breathing non-labored at rest  Cardiovascular: regular rate and sinus rhythm  Gastrointestinal: morbidly obese, noticeably less peri-incisional abdominal tenderness to palpation, 20 cm (length) x 12 cm (width) x 10 cm (depth) lower- abdominal laparotomy negative pressure wound VACintact (before and after  dressing change) with foul-smelling necrotic abdominal wall fat debrided nearly entirely during wound VAC change to home Medela wound VAC today, but no fluid/material through the intact fascia, JP drain secured with no further serosanguinous drainage Musculoskeletal: UE and LE FROM, motor and sensation grossly intact, NT   Labs:  CBC Latest Ref Rng & Units 11/05/2016 11/04/2016 11/03/2016  WBC 3.8 - 10.6 K/uL 9.5 11.1(H) 13.1(H)  Hemoglobin 13.0 - 18.0 g/dL 11.7(L) 12.0(L) 12.5(L)  Hematocrit 40.0 - 52.0 % 33.5(L) 35.2(L) 36.4(L)  Platelets 150 - 440 K/uL 223 198 190   CMP Latest Ref Rng & Units 11/06/2016 11/03/2016 11/02/2016  Glucose 65 - 99 mg/dL - 99 98  BUN 6 - 20 mg/dL - 30(H) 28(H)  Creatinine 0.61 - 1.24 mg/dL 0.47(L) 0.66 1.01  Sodium 135 - 145 mmol/L - 135 137  Potassium 3.5 - 5.1 mmol/L - 3.7 3.2(L)  Chloride 101 - 111 mmol/L - 100(L) 102  CO2 22 - 32 mmol/L - 30 27  Calcium 8.9 - 10.3 mg/dL - 8.1(L) 8.4(L)  Total Protein 6.5 - 8.1 g/dL - - -  Total Bilirubin 0.3 - 1.2 mg/dL - - -  Alkaline Phos 38 - 126 U/L - - -  AST 15 - 41 U/L - - -  ALT 17 - 63 U/L - - -   Imaging studies: No new pertinent imaging studies   Assessment/Plan:(ICD-10's: K35.2) 56 y.o.morbidly obese Malewith resolvedfever and resolvedleukocytosis, though foul odor from abdominal wall fat during wound VAC change again yesterday despite no erythema or drainage from intact abdominal wall fascia 7Days Post-Ops/p open appendectomy with placement of a 20 cm (length) x 12 cm (width) x 10 cm (depth) mid-/lower- abdominal laparotomy negative pressure wound VAC and JP  drainfor acute appendicitis, complicated by pertinent comorbidities including morbid obesity (BMI 49), HTN, and GERD with PUD.  - pain control prn(minimize narcotics) - continue heart-healthy regular diet as tolerated - weight loss strongly encouraged, discussed bariatric program info with patient and his wife as  requested - bowel regimen is not contra-indicated as stated in RN notes, but rather is not indicated for now-resolved post-operative ileus             - d/c home today after VAC dressing change + all visible necrotic abdominal wall adipose tissue debrided during VAC change today considering foul odor from abdominal wall fat during wound VAC change (but no surrounding erythema or drainage from intact abdominal wall fascia)  - outpatient surgical follow-up in 1 week, will likely require additional debridement  - JP drain removed this morning considering no recent drainage into JP  - complete course of PO antibiotics as prescribed - incentive spirometry encouraged - ambulation encouraged  All of the above findings and recommendations were discussed with the patient and his wife, and all of patient's and his wife's questions were answered to their expressed satisfaction.  -- Marilynne Drivers Rosana Hoes, MD, [place]: [facility] - Partnering for exceptional care. Office: [phone]

## 2016-11-06 NOTE — Discharge Instructions (Addendum)
In addition to included general post-operative instructions for Open Appendectomy and Wound VAC (negative pressure wound therapy),  Diet: Resume home heart healthy diet.   Activity: No heavy lifting >20 pounds (children, pets, laundry, garbage) or strenuous activity until follow-up, but light activity and walking are encouraged. Do not drive or drink alcohol if taking narcotic pain medications.  Wound care: Must keep wound VAC Clean and Dry. If able to coordinate removing wound VAC before home care arrives to apply new wound VAC, may shower/get incision wet with soapy water and pat dry (do not rub incisions), but no baths or submerging incision underwater until follow-up.  Medications: Resume all home medications AND Complete course of Antibiotics as prescribed even if you are feeling better. For mild to moderate pain: acetaminophen (Tylenol) or ibuprofen (if no kidney disease). Combining Tylenol with alcohol can substantially increase your risk of causing liver disease. Narcotic pain medications, if prescribed, can be used for severe pain, though may cause nausea, constipation, and drowsiness. Do not combine Tylenol and Percocet within a 6 hour period as Percocet contains Tylenol. If you do not need the narcotic pain medication, you do not need to fill the prescription.  Call office [phone]) at any time if any questions, worsening pain, fevers/chills, bleeding, drainage from incision site, or other concerns.

## 2016-11-06 NOTE — Progress Notes (Signed)
Pt d/c to home today. PICC line removed intact.  Rx's given to pt w/all questions and concerns addressed.  D/C paperwork reviewed and education provided with all questions and concerns addressed.  Wound Vac delivered and changed by Dr. Rosana Hoes p/t pt d/c.  Pt wife at bedside for home transport.

## 2016-11-06 NOTE — Care Management Note (Signed)
Case Management Note  Patient Details  Name: Peter [name] MRN: [id number] Date of Birth: May 14, 1961  Subjective/Objective:        Home vac has been delivered by [facility] to Room 227. Dr Rosana Hoes is at bedside to attach wound vac for discharge to home. A Bariatric RW was delivered earlier today. Home health RN and PT will be provided by [facility].             Action/Plan:   Expected Discharge Date:  11/06/16               Expected Discharge Plan:     In-House Referral:     Discharge planning Services     Post Acute Care Choice:    Choice offered to:     DME Arranged:    DME Agency:     HH Arranged:    HH Agency:     Status of Service:     If discussed at [employer], dates discussed:    Additional Comments:  Shastina Rua A, RN 11/06/2016, 4:43 PM

## 2016-11-07 NOTE — Progress Notes (Signed)
Patient was medicated with OXycodone 10 mg.MD okay for the patient to leave right away after med given. Patient was transported to car with staff.

## 2016-11-07 NOTE — Progress Notes (Signed)
[facility] Hospital Day(s): 8.   Post op day(s): 8 Days Post-Op.   Interval History: Patient seen and examined, no acute events or new complaints overnight. Patient reports feeling much better this morning than following debridement of fat necrosis and wound VAC change late yesterday afternoon upon delivery of outpatient wound VAC, denies fever/chills, N/V, CP, or SOB. He describes that he tolerated dinner last night and wishes to ambulate and return home for breakfast.  Review of Systems:  Constitutional: denies fever, chills  HEENT: denies cough or congestion  Respiratory: denies any shortness of breath  Cardiovascular: denies chest pain or palpitations  Gastrointestinal: abdominal pain, N/V, and bowel function as per interval history Genitourinary: denies burning with urination or urinary frequency Musculoskeletal: denies pain, decreased motor or sensation Integumentary: denies any other rashes or skin discolorations except post-surgical abdominal wound Neurological: denies HA or vision/hearing changes   Vital signs in last 24 hours: [min-max] current  Temp:  [97.9 F (36.6 C)-98.2 F (36.8 C)] 98.2 F (36.8 C) (05/06 0452) Pulse Rate:  [72-78] 78 (05/06 0452) Resp:  [18-20] 20 (05/06 0452) BP: (152)/(77-82) 152/82 (05/06 0452) SpO2:  [94 %] 94 % (05/06 0452)     Height: 5\' 7"  (170.2 cm) Weight: (!) 310 lb (140.6 kg) BMI (Calculated): 48.7   Intake/Output this shift:  No intake/output data recorded.   Intake/Output last 2 shifts:  @IOLAST2SHIFTS @   Physical Exam:  Constitutional: alert, cooperative and no distress  HENT: normocephalic without obvious abnormality  Eyes: PERRL, EOM's grossly intact and symmetric  Neuro: CN II - XII grossly intact and symmetric without deficit  Respiratory: breathing non-labored at rest  Cardiovascular: regular rate and sinus rhythm  Gastrointestinal: soft and obese with mild peri-incisional tenderness to palpation, no  erythema, wound VAC well-secured with seal intact, abdomen non-distended  Musculoskeletal: UE and LE FROM, motor and sensation grossly intact, NT   Labs:  CBC Latest Ref Rng & Units 11/05/2016 11/04/2016 11/03/2016  WBC 3.8 - 10.6 K/uL 9.5 11.1(H) 13.1(H)  Hemoglobin 13.0 - 18.0 g/dL 11.7(L) 12.0(L) 12.5(L)  Hematocrit 40.0 - 52.0 % 33.5(L) 35.2(L) 36.4(L)  Platelets 150 - 440 K/uL 223 198 190   CMP Latest Ref Rng & Units 11/06/2016 11/03/2016 11/02/2016  Glucose 65 - 99 mg/dL - 99 98  BUN 6 - 20 mg/dL - 30(H) 28(H)  Creatinine 0.61 - 1.24 mg/dL 0.47(L) 0.66 1.01  Sodium 135 - 145 mmol/L - 135 137  Potassium 3.5 - 5.1 mmol/L - 3.7 3.2(L)  Chloride 101 - 111 mmol/L - 100(L) 102  CO2 22 - 32 mmol/L - 30 27  Calcium 8.9 - 10.3 mg/dL - 8.1(L) 8.4(L)  Total Protein 6.5 - 8.1 g/dL - - -  Total Bilirubin 0.3 - 1.2 mg/dL - - -  Alkaline Phos 38 - 126 U/L - - -  AST 15 - 41 U/L - - -  ALT 17 - 63 U/L - - -    Imaging studies: No new pertinent imaging studies  Assessment/Plan:(ICD-10's: K35.2) 56 y.o.morbidly obese Malewith resolvedfever and resolvedleukocytosis, thoughfoul odor from abdominal wall fat during wound VAC change again yesterday despite no erythema or drainage from intact abdominal wall fascia prompting debridement of fat necrosis to healthier-appearing fat 7Days Post-Ops/p open appendectomy with placement of a 20 cm (length) x 12 cm (width) x 10 cm (depth) mid-/lower- abdominal laparotomy negative pressure wound VAC and JP drainfor acute appendicitis, complicated by pertinent comorbidities including morbid obesity (BMI 49), HTN, and GERD with PUD.  -  pain control prn(minimize narcotics) - continue heart-healthy regulardiet as tolerated - weight loss strongly encouraged, discussed bariatric program info with patient and his wife as requested - bowel regimen is not contra-indicated as stated in RN notes, but rather is not indicated  for now-resolved post-operative ileus - d/c home today after yesterday's VAC dressing change + debridement, washout, and further debridement of all visibly necrotic abdominal wall fat considering foul odor from wound during wound VAC change (but no surrounding erythema or drainage from intact abdominal wall fascia)             - outpatient surgical follow-up Thursday 5/10, will likely require additional debridement             - JP drain removed yesterday considering no recent JP drainage             - complete course of PO antibiotics as prescribed - ambulation encouraged  All of the above findings and recommendations were discussed with the patient and his wife, and all of patient's and his wife's questions were answered to theirexpressed satisfaction.  -- Marilynne Drivers Rosana Hoes, MD, [place]: [facility] - Partnering for exceptional care. Office: [phone]

## 2016-11-07 NOTE — Progress Notes (Signed)
Discharge order received yesterday.Patient is alert and oriented. Vital signs stable . No signs of acute distress. Discharge instructions given. Patient verbalized understanding. No other issues noted at this time.

## 2016-11-07 NOTE — Care Management Note (Signed)
Case Management Note  Patient Details  Name: Peter [name] MRN: [id number] Date of Birth: Sep 20, 1960  Subjective/Objective:      [name] was discharged home today with a home wound vac. Referral was sent yesterday to [facility] requesting HH-PT, RN services. A Biariatric RW was delivered to [name] yesterday by [facility].               Action/Plan:   Expected Discharge Date:  11/06/16               Expected Discharge Plan:   11/07/16  In-House Referral:     Discharge planning Services   home with home health  Post Acute Care Choice:   [facility] Choice offered to:   Patient  DME Arranged:   Bariatric RW, Home Wound Vac. DME Agency:   [facility]  HH Arranged:   PT, RN [place] Agency:   [facility]  Status of Service:   Discharged home today 11/07/16.  If discussed at [employer] of Stay Meetings, dates discussed:    Additional Comments:  Nachelle Negrette A, RN 11/07/2016, 8:21 AM

## 2016-11-08 ENCOUNTER — Telehealth: Payer: Self-pay | Admitting: General Practice

## 2016-11-08 NOTE — Telephone Encounter (Signed)
Patient is on oxycodone 5mg , his wife says he eats but not a lot. Shaky, jerking, seeing and hearing things that are not there, patient has also trouble also speaking. He is asking if there is something else he can take for the pain. He is coming in on 11/11/16 for a post op appointment. Please call and advice.

## 2016-11-08 NOTE — Telephone Encounter (Signed)
Spoke with patients wife at this and mentioned to her to have to stop the oxycodone immediately. Told him that he could take tylenol and/ or aleve. She stated that only ibuprofen would work for him and wanted to know if there was something stronger that we could prescribe for him. I stated that in order for the office to prescribe him any other medication that he would have to be seen in office. Since the patient has an appointment scheduled for 5/10 she preferred to wait until then for [place] to see him. I ensured that she understood and patient's wife verbalized understanding.

## 2016-11-09 ENCOUNTER — Ambulatory Visit: Payer: 59 | Admitting: Cardiology

## 2016-11-10 ENCOUNTER — Other Ambulatory Visit: Payer: Self-pay

## 2016-11-10 NOTE — Discharge Summary (Signed)
Physician Discharge Summary  Patient ID: Peter [name] MRN: [id number] DOB/AGE: [birth year] 56 y.o.  Admit date: 10/30/2016 Discharge date: 11/10/2016  Admission Diagnoses:  Discharge Diagnoses:  Active Problems:   Appendicitis, acute   Discharged Condition: fair  Hospital Course: 56 year old morbidly obese Male presented on day of admission to [facility] ED for moderate to severe RLQ abdominal pain x 18 hours, associated with nausea and decreased appetite. CT imaging confirmed acute appendicits. Due to difficulty with achieving adequate pneumoperitoneum and visibility accordingly due to patient's body habitus, decision was made to convert to open procedure via lower midline laparotomy incision. Appendectomy was then completed as described in operative report (Dr. Dahlia Byes, 10/30/2016), fascia was reapproximated, and skin and subcutaneous fat were left open with wound VAC dressing placed due to high risk for wound-associated complications such as infection. Post-operatively, patient's pain slowly and steadily improved, while his ileus and leukocytosis resolved. During patient's first wound VAC change, a foul-smelling hematoma was evacuated from the wound, while during the second and third wound VAC changes, there was a small to moderate amount of fat necrosis, which was debrided during the third wound VAC change prior to patient's discharge. Passing flatus, ambulating, and tolerating his diet with normalized WBC and pain well-controlled, discharge planning was initiated, and patient was safely able to be discharged home with home care for wound VAC changes.  Consults: None  Significant Diagnostic Studies: labs: WBC - 16.0 (peak 5/1) and 9.5 (5/4) and radiology: CT scan: Acute appendicitis  Treatments: IV hydration, antibiotics: Zosyn and surgery: Laparoscopic appendectomy  Discharge Exam: Blood pressure (!) 152/82, pulse 78, temperature 98.2 F (36.8 C), temperature source Oral, resp. rate 20, height  5\' 7"  (1.702 m), weight (!) 310 lb (140.6 kg), SpO2 94 %. General appearance: alert, cooperative, no distress and morbidly obese GI: abdomen soft, morbidly obese, mild peri-incisional tenderness to palpation with wound VAC secured with stable seal, no surrounding erythema or drainage  Disposition: 01-Home or Self Care   Allergies as of 11/07/2016      Reactions   Isosorbide    Headache, nausea, vomiting      Medication List    TAKE these medications   amLODipine 10 MG tablet Commonly known as:  NORVASC TAKE 1 TABLET BY MOUTH EVERY DAY   amoxicillin-clavulanate 875-125 MG tablet Commonly known as:  AUGMENTIN Take 1 tablet by mouth every 12 (twelve) hours.   aspirin EC 81 MG tablet Take 81 mg by mouth daily.   atorvastatin 40 MG tablet Commonly known as:  LIPITOR Take 1 tablet (40 mg total) by mouth daily. What changed:  when to take this   clobetasol cream 0.05 % Commonly known as:  TEMOVATE Apply 1 application topically at bedtime as needed.   EQUETRO 200 MG Cp12 12 hr capsule Generic drug:  carbamazepine Take 400-600 mg by mouth 2 (two) times daily. Take 400 mg in the morning & 600 mg in the evening.   fluticasone 50 MCG/ACT nasal spray Commonly known as:  FLONASE Place 2 sprays into both nostrils daily. What changed:  when to take this  reasons to take this   Garlic 878 MG Tabs Take 100 mg by mouth 2 (two) times daily.   hydrALAZINE 25 MG tablet Commonly known as:  APRESOLINE TAKE 1 TABLET(25 MG) BY MOUTH THREE TIMES DAILY   ibuprofen 200 MG tablet Commonly known as:  ADVIL,MOTRIN Take 800 mg by mouth 3 (three) times daily as needed for headache or moderate pain.  lamoTRIgine 200 MG tablet Commonly known as:  LAMICTAL Take 400 mg by mouth at bedtime.   latanoprost 0.005 % ophthalmic solution Commonly known as:  XALATAN Place 1 drop into both eyes daily.   losartan 100 MG tablet Commonly known as:  COZAAR TAKE 1 TABLET BY MOUTH EVERY DAY    MAGNESIUM-OXIDE 400 (241.3 Mg) MG tablet Generic drug:  magnesium oxide Take 400 mg by mouth 2 (two) times daily.   Nebivolol HCl 20 MG Tabs Commonly known as:  BYSTOLIC Take 1 tablet (20 mg total) by mouth every evening.   OMEGA-3 FATTY ACIDS PO Take 1 capsule by mouth 2 (two) times daily.   oxyCODONE 5 MG immediate release tablet Commonly known as:  Oxy IR/ROXICODONE Take 1-2 tablets (5-10 mg total) by mouth every 4 (four) hours as needed for severe pain.   potassium chloride SA 20 MEQ tablet Commonly known as:  K-DUR,KLOR-CON Take 2 tablets (40 mEq total) by mouth 3 (three) times daily.   ranitidine 300 MG tablet Commonly known as:  ZANTAC TAKE 1 TABLET(300 MG) BY MOUTH TWICE DAILY   RUTIN (MISC. NUTRITIONAL FACTORS) TAKE 1 TABLET (500 MG) BY MOUTH DAILY   sertraline 100 MG tablet Commonly known as:  ZOLOFT TAKE 1 TABLET BY MOUTH TWICE DAILY What changed:  See the new instructions.   spironolactone 50 MG tablet Commonly known as:  ALDACTONE Take 1 tablet (50 mg total) by mouth daily.   vitamin C 500 MG tablet Commonly known as:  ASCORBIC ACID Take 500 mg by mouth every evening.   Vitamin D (Ergocalciferol) 50000 units Caps capsule Commonly known as:  DRISDOL Take 1 capsule (50,000 Units total) by mouth every 7 (seven) days.      Follow-up [place]. Schedule an appointment as soon as possible for a visit in 1 week(s).   Specialty:  General Surgery Why:  Call to schedule for Thursday 5/10 or Tuesday 5/15. ** Bring wound VAC supplies to office on day of appointment. ** Home care will not need to change wound VAC on day of appointment. Contact information: [place] [phone]          Signed: Vickie Epley 11/10/2016, 8:30 AM

## 2016-11-11 ENCOUNTER — Inpatient Hospital Stay
Admission: EM | Admit: 2016-11-11 | Discharge: 2016-11-23 | DRG: 856 | Disposition: A | Discharge status: Other Acute Inpatient Hospital | Payer: 59 | Attending: Surgery | Admitting: Surgery

## 2016-11-11 ENCOUNTER — Emergency Department: Payer: 59 | Admitting: Anesthesiology

## 2016-11-11 ENCOUNTER — Inpatient Hospital Stay: Payer: 59

## 2016-11-11 ENCOUNTER — Encounter
Admission: EM | Disposition: A | Discharge status: Other Acute Inpatient Hospital | Payer: Self-pay | Source: Home / Self Care | Attending: Surgery

## 2016-11-11 ENCOUNTER — Encounter: Payer: Self-pay | Admitting: Emergency Medicine

## 2016-11-11 ENCOUNTER — Emergency Department: Payer: 59

## 2016-11-11 ENCOUNTER — Encounter: Payer: 59 | Admitting: Surgery

## 2016-11-11 DIAGNOSIS — J9601 Acute respiratory failure with hypoxia: Secondary | ICD-10-CM | POA: Diagnosis not present

## 2016-11-11 DIAGNOSIS — R601 Generalized edema: Secondary | ICD-10-CM

## 2016-11-11 DIAGNOSIS — A419 Sepsis, unspecified organism: Secondary | ICD-10-CM | POA: Diagnosis present

## 2016-11-11 DIAGNOSIS — T8131XS Disruption of external operation (surgical) wound, not elsewhere classified, sequela: Secondary | ICD-10-CM | POA: Diagnosis not present

## 2016-11-11 DIAGNOSIS — R109 Unspecified abdominal pain: Secondary | ICD-10-CM | POA: Diagnosis not present

## 2016-11-11 DIAGNOSIS — T814XXA Infection following a procedure, initial encounter: Secondary | ICD-10-CM | POA: Diagnosis not present

## 2016-11-11 DIAGNOSIS — D62 Acute posthemorrhagic anemia: Secondary | ICD-10-CM | POA: Diagnosis present

## 2016-11-11 DIAGNOSIS — R739 Hyperglycemia, unspecified: Secondary | ICD-10-CM | POA: Diagnosis present

## 2016-11-11 DIAGNOSIS — Y836 Removal of other organ (partial) (total) as the cause of abnormal reaction of the patient, or of later complication, without mention of misadventure at the time of the procedure: Secondary | ICD-10-CM | POA: Diagnosis present

## 2016-11-11 DIAGNOSIS — Z8711 Personal history of peptic ulcer disease: Secondary | ICD-10-CM

## 2016-11-11 DIAGNOSIS — M7989 Other specified soft tissue disorders: Secondary | ICD-10-CM | POA: Diagnosis present

## 2016-11-11 DIAGNOSIS — I10 Essential (primary) hypertension: Secondary | ICD-10-CM | POA: Diagnosis present

## 2016-11-11 DIAGNOSIS — D6489 Other specified anemias: Secondary | ICD-10-CM | POA: Diagnosis present

## 2016-11-11 DIAGNOSIS — T8130XA Disruption of wound, unspecified, initial encounter: Secondary | ICD-10-CM | POA: Diagnosis present

## 2016-11-11 DIAGNOSIS — J9611 Chronic respiratory failure with hypoxia: Secondary | ICD-10-CM | POA: Diagnosis not present

## 2016-11-11 DIAGNOSIS — M726 Necrotizing fasciitis: Secondary | ICD-10-CM | POA: Diagnosis present

## 2016-11-11 DIAGNOSIS — Z978 Presence of other specified devices: Secondary | ICD-10-CM

## 2016-11-11 DIAGNOSIS — Z4659 Encounter for fitting and adjustment of other gastrointestinal appliance and device: Secondary | ICD-10-CM

## 2016-11-11 DIAGNOSIS — L0889 Other specified local infections of the skin and subcutaneous tissue: Secondary | ICD-10-CM

## 2016-11-11 DIAGNOSIS — J9690 Respiratory failure, unspecified, unspecified whether with hypoxia or hypercapnia: Secondary | ICD-10-CM

## 2016-11-11 DIAGNOSIS — H409 Unspecified glaucoma: Secondary | ICD-10-CM | POA: Diagnosis present

## 2016-11-11 DIAGNOSIS — R0600 Dyspnea, unspecified: Secondary | ICD-10-CM

## 2016-11-11 DIAGNOSIS — K567 Ileus, unspecified: Secondary | ICD-10-CM

## 2016-11-11 DIAGNOSIS — T81321A Disruption or dehiscence of closure of internal operation (surgical) wound of abdominal wall muscle or fascia, initial encounter: Secondary | ICD-10-CM

## 2016-11-11 DIAGNOSIS — Z931 Gastrostomy status: Secondary | ICD-10-CM

## 2016-11-11 DIAGNOSIS — G9341 Metabolic encephalopathy: Secondary | ICD-10-CM | POA: Diagnosis present

## 2016-11-11 DIAGNOSIS — Z6841 Body Mass Index (BMI) 40.0 and over, adult: Secondary | ICD-10-CM | POA: Diagnosis not present

## 2016-11-11 DIAGNOSIS — J9600 Acute respiratory failure, unspecified whether with hypoxia or hypercapnia: Secondary | ICD-10-CM | POA: Diagnosis not present

## 2016-11-11 DIAGNOSIS — R6521 Severe sepsis with septic shock: Secondary | ICD-10-CM | POA: Diagnosis present

## 2016-11-11 DIAGNOSIS — N179 Acute kidney failure, unspecified: Secondary | ICD-10-CM | POA: Diagnosis present

## 2016-11-11 DIAGNOSIS — K56609 Unspecified intestinal obstruction, unspecified as to partial versus complete obstruction: Secondary | ICD-10-CM

## 2016-11-11 DIAGNOSIS — T8131XA Disruption of external operation (surgical) wound, not elsewhere classified, initial encounter: Secondary | ICD-10-CM | POA: Diagnosis not present

## 2016-11-11 DIAGNOSIS — E876 Hypokalemia: Secondary | ICD-10-CM | POA: Diagnosis present

## 2016-11-11 DIAGNOSIS — R609 Edema, unspecified: Secondary | ICD-10-CM

## 2016-11-11 DIAGNOSIS — K439 Ventral hernia without obstruction or gangrene: Secondary | ICD-10-CM | POA: Diagnosis not present

## 2016-11-11 DIAGNOSIS — K436 Other and unspecified ventral hernia with obstruction, without gangrene: Secondary | ICD-10-CM

## 2016-11-11 DIAGNOSIS — T8131XD Disruption of external operation (surgical) wound, not elsewhere classified, subsequent encounter: Secondary | ICD-10-CM | POA: Diagnosis not present

## 2016-11-11 DIAGNOSIS — Z8249 Family history of ischemic heart disease and other diseases of the circulatory system: Secondary | ICD-10-CM | POA: Diagnosis not present

## 2016-11-11 DIAGNOSIS — J9612 Chronic respiratory failure with hypercapnia: Secondary | ICD-10-CM | POA: Diagnosis not present

## 2016-11-11 DIAGNOSIS — R52 Pain, unspecified: Secondary | ICD-10-CM | POA: Diagnosis not present

## 2016-11-11 DIAGNOSIS — Z9911 Dependence on respirator [ventilator] status: Secondary | ICD-10-CM | POA: Diagnosis not present

## 2016-11-11 DIAGNOSIS — G9340 Encephalopathy, unspecified: Secondary | ICD-10-CM | POA: Diagnosis not present

## 2016-11-11 DIAGNOSIS — R6520 Severe sepsis without septic shock: Secondary | ICD-10-CM | POA: Diagnosis not present

## 2016-11-11 DIAGNOSIS — L089 Local infection of the skin and subcutaneous tissue, unspecified: Secondary | ICD-10-CM | POA: Diagnosis present

## 2016-11-11 DIAGNOSIS — K660 Peritoneal adhesions (postprocedural) (postinfection): Secondary | ICD-10-CM | POA: Diagnosis present

## 2016-11-11 DIAGNOSIS — IMO0002 Reserved for concepts with insufficient information to code with codable children: Secondary | ICD-10-CM | POA: Diagnosis present

## 2016-11-11 DIAGNOSIS — R140 Abdominal distension (gaseous): Secondary | ICD-10-CM

## 2016-11-11 HISTORY — PX: LAPAROTOMY: SHX154

## 2016-11-11 LAB — COMPREHENSIVE METABOLIC PANEL
ALBUMIN: 2.5 g/dL — AB (ref 3.5–5.0)
ALBUMIN: 2.3 g/dL — AB (ref 3.5–5.0)
ALK PHOS: 85 U/L (ref 38–126)
ALT: 37 U/L (ref 17–63)
ALT: 33 U/L (ref 17–63)
ANION GAP: 14 (ref 5–15)
AST: 53 U/L — AB (ref 15–41)
AST: 41 U/L (ref 15–41)
Alkaline Phosphatase: 96 U/L (ref 38–126)
Anion gap: 10 (ref 5–15)
BILIRUBIN TOTAL: 1.1 mg/dL (ref 0.3–1.2)
BUN: 16 mg/dL (ref 6–20)
BUN: 14 mg/dL (ref 6–20)
CALCIUM: 8 mg/dL — AB (ref 8.9–10.3)
CALCIUM: 7.7 mg/dL — AB (ref 8.9–10.3)
CO2: 24 mmol/L (ref 22–32)
CO2: 26 mmol/L (ref 22–32)
CREATININE: 1.2 mg/dL (ref 0.61–1.24)
CREATININE: 1.12 mg/dL (ref 0.61–1.24)
Chloride: 100 mmol/L — ABNORMAL LOW (ref 101–111)
Chloride: 99 mmol/L — ABNORMAL LOW (ref 101–111)
GFR calc Af Amer: >60 mL/min (ref >60)
GFR calc non Af Amer: >60 mL/min (ref >60)
GLUCOSE: 212 mg/dL — AB (ref 65–99)
Glucose, Bld: 131 mg/dL — ABNORMAL HIGH (ref 65–99)
Potassium: 2.7 mmol/L — CL (ref 3.5–5.1)
Potassium: 3.3 mmol/L — ABNORMAL LOW (ref 3.5–5.1)
SODIUM: 138 mmol/L (ref 135–145)
Sodium: 135 mmol/L (ref 135–145)
TOTAL PROTEIN: 5.9 g/dL — AB (ref 6.5–8.1)
Total Bilirubin: 1.2 mg/dL (ref 0.3–1.2)
Total Protein: 6.3 g/dL — ABNORMAL LOW (ref 6.5–8.1)

## 2016-11-11 LAB — CBC WITH DIFFERENTIAL/PLATELET
BASOS ABS: 0 10*3/uL (ref 0–0.1)
Basophils Relative: 0 %
EOS ABS: 0 10*3/uL (ref 0–0.7)
EOS PCT: 0 %
HCT: 39.6 % — ABNORMAL LOW (ref 40.0–52.0)
Hemoglobin: 13 g/dL (ref 13.0–18.0)
LYMPHS PCT: 2 %
Lymphs Abs: 0.9 10*3/uL — ABNORMAL LOW (ref 1.0–3.6)
MCH: 28.5 pg (ref 26.0–34.0)
MCHC: 32.8 g/dL (ref 32.0–36.0)
MCV: 86.8 fL (ref 80.0–100.0)
MONO ABS: 1 10*3/uL (ref 0.2–1.0)
Monocytes Relative: 3 %
Neutro Abs: 36.8 10*3/uL — ABNORMAL HIGH (ref 1.4–6.5)
Neutrophils Relative %: 95 %
PLATELETS: 452 10*3/uL — AB (ref 150–440)
RBC: 4.56 MIL/uL (ref 4.40–5.90)
RDW: 13.7 % (ref 11.5–14.5)
WBC: 38.6 10*3/uL — AB (ref 3.8–10.6)

## 2016-11-11 LAB — CBC
HEMATOCRIT: 36.9 % — AB (ref 40.0–52.0)
HEMOGLOBIN: 12.5 g/dL — AB (ref 13.0–18.0)
MCH: 29 pg (ref 26.0–34.0)
MCHC: 33.9 g/dL (ref 32.0–36.0)
MCV: 85.4 fL (ref 80.0–100.0)
Platelets: 440 10*3/uL (ref 150–440)
RBC: 4.32 MIL/uL — AB (ref 4.40–5.90)
RDW: 13.4 % (ref 11.5–14.5)
WBC: 14.8 10*3/uL — AB (ref 3.8–10.6)

## 2016-11-11 LAB — BLOOD GAS, ARTERIAL
ACID-BASE EXCESS: 1.2 mmol/L (ref 0.0–2.0)
Bicarbonate: 27.6 mmol/L (ref 20.0–28.0)
FIO2: 40
O2 SAT: 97.8 %
PCO2 ART: 50 mmHg — AB (ref 32.0–48.0)
PEEP: 5 cmH2O
PH ART: 7.35 (ref 7.350–7.450)
Patient temperature: 37
RATE: 15 resp/min
VT: 500 mL
pO2, Arterial: 106 mmHg (ref 83.0–108.0)

## 2016-11-11 LAB — GLUCOSE, CAPILLARY
GLUCOSE-CAPILLARY: 164 mg/dL — AB (ref 65–99)
Glucose-Capillary: 131 mg/dL — ABNORMAL HIGH (ref 65–99)
Glucose-Capillary: 202 mg/dL — ABNORMAL HIGH (ref 65–99)
Glucose-Capillary: 191 mg/dL — ABNORMAL HIGH (ref 65–99)

## 2016-11-11 LAB — LACTIC ACID, PLASMA
LACTIC ACID, VENOUS: 1 mmol/L (ref 0.5–1.9)
Lactic Acid, Venous: 0.8 mmol/L (ref 0.5–1.9)

## 2016-11-11 LAB — LIPASE, BLOOD: LIPASE: 40 U/L (ref 11–51)

## 2016-11-11 LAB — TROPONIN I: Troponin I: <0.03 ng/mL (ref <0.03)

## 2016-11-11 LAB — MRSA PCR SCREENING: MRSA by PCR: NEGATIVE

## 2016-11-11 LAB — TRIGLYCERIDES: Triglycerides: 140 mg/dL (ref <150)

## 2016-11-11 LAB — MAGNESIUM
MAGNESIUM: 1.6 mg/dL — AB (ref 1.7–2.4)
MAGNESIUM: 1.6 mg/dL — AB (ref 1.7–2.4)

## 2016-11-11 LAB — PROCALCITONIN: Procalcitonin: 0.34 ng/mL

## 2016-11-11 SURGERY — LAPAROTOMY, EXPLORATORY
Anesthesia: General | Wound class: Dirty or Infected

## 2016-11-11 MED ORDER — ONDANSETRON HCL 4 MG/2ML IJ SOLN
4.0000 mg | Freq: Once | INTRAMUSCULAR | Status: AC
  Administered 2016-11-11: 4 mg via INTRAVENOUS
  Filled 2016-11-11: qty 2

## 2016-11-11 MED ORDER — HYDROMORPHONE HCL 1 MG/ML IJ SOLN
1.0000 mg | Freq: Once | INTRAMUSCULAR | Status: AC
  Administered 2016-11-11: 1 mg via INTRAVENOUS

## 2016-11-11 MED ORDER — HYDROMORPHONE HCL 1 MG/ML IJ SOLN
2.0000 mg | Freq: Once | INTRAMUSCULAR | Status: AC
  Administered 2016-11-11: 2 mg via INTRAVENOUS

## 2016-11-11 MED ORDER — PROPOFOL 10 MG/ML IV BOLUS
INTRAVENOUS | Status: DC | PRN
  Administered 2016-11-11: 150 mg via INTRAVENOUS

## 2016-11-11 MED ORDER — DIPHENHYDRAMINE HCL 12.5 MG/5ML PO ELIX: 12.5000 mg | ORAL_SOLUTION | Freq: Four times a day (QID) | ORAL | Status: DC | PRN

## 2016-11-11 MED ORDER — ROCURONIUM BROMIDE 50 MG/5ML IV SOLN
INTRAVENOUS | Status: AC
  Filled 2016-11-11: qty 1

## 2016-11-11 MED ORDER — FENTANYL CITRATE (PF) 100 MCG/2ML IJ SOLN
INTRAMUSCULAR | Status: AC
  Administered 2016-11-11: 100 ug via INTRAVENOUS
  Filled 2016-11-11: qty 4

## 2016-11-11 MED ORDER — PIPERACILLIN-TAZOBACTAM 4.5 G IVPB
4.5000 g | Freq: Three times a day (TID) | INTRAVENOUS | Status: DC
  Filled 2016-11-11 (×2): qty 100

## 2016-11-11 MED ORDER — PIPERACILLIN-TAZOBACTAM 3.375 G IVPB
INTRAVENOUS | Status: AC
  Filled 2016-11-11: qty 50

## 2016-11-11 MED ORDER — ONDANSETRON HCL 4 MG/2ML IJ SOLN
INTRAMUSCULAR | Status: AC
  Filled 2016-11-11: qty 2

## 2016-11-11 MED ORDER — FENTANYL CITRATE (PF) 250 MCG/5ML IJ SOLN
INTRAMUSCULAR | Status: AC
  Filled 2016-11-11: qty 5

## 2016-11-11 MED ORDER — NOREPINEPHRINE BITARTRATE 1 MG/ML IV SOLN
0.0000 ug/min | INTRAVENOUS | Status: DC
  Administered 2016-11-12: 5 ug/min via INTRAVENOUS
  Administered 2016-11-12: 9 ug/min via INTRAVENOUS
  Administered 2016-11-13 (×3): 18 ug/min via INTRAVENOUS
  Administered 2016-11-13: 17 ug/min via INTRAVENOUS
  Administered 2016-11-13: 18 ug/min via INTRAVENOUS
  Administered 2016-11-13: 16 ug/min via INTRAVENOUS
  Administered 2016-11-14 (×2): 13 ug/min via INTRAVENOUS
  Administered 2016-11-14: 15 ug/min via INTRAVENOUS
  Administered 2016-11-16: 2 ug/min via INTRAVENOUS
  Administered 2016-11-16 – 2016-11-17 (×2): 5 ug/min via INTRAVENOUS
  Filled 2016-11-11 (×18): qty 4

## 2016-11-11 MED ORDER — SUCCINYLCHOLINE CHLORIDE 20 MG/ML IJ SOLN
INTRAMUSCULAR | Status: DC | PRN
  Administered 2016-11-11: 200 mg via INTRAVENOUS

## 2016-11-11 MED ORDER — INSULIN ASPART 100 UNIT/ML ~~LOC~~ SOLN
0.0000 [IU] | SUBCUTANEOUS | Status: DC
  Administered 2016-11-11: 5 [IU] via SUBCUTANEOUS
  Administered 2016-11-11 – 2016-11-12 (×2): 3 [IU] via SUBCUTANEOUS
  Administered 2016-11-12 – 2016-11-22 (×11): 2 [IU] via SUBCUTANEOUS
  Filled 2016-11-11 (×4): qty 2
  Filled 2016-11-11: qty 3
  Filled 2016-11-11: qty 2
  Filled 2016-11-11: qty 5
  Filled 2016-11-11 (×3): qty 2
  Filled 2016-11-11: qty 3
  Filled 2016-11-11 (×3): qty 2

## 2016-11-11 MED ORDER — ONDANSETRON HCL 4 MG/2ML IJ SOLN: 4.0000 mg | Freq: Four times a day (QID) | INTRAMUSCULAR | Status: DC | PRN

## 2016-11-11 MED ORDER — MORPHINE SULFATE (PF) 4 MG/ML IV SOLN
INTRAVENOUS | Status: AC
  Filled 2016-11-11: qty 1

## 2016-11-11 MED ORDER — PROPOFOL 1000 MG/100ML IV EMUL
0.0000 ug/kg/min | INTRAVENOUS | Status: DC
  Administered 2016-11-11: 25 ug/kg/min via INTRAVENOUS
  Administered 2016-11-11: 30 ug/kg/min via INTRAVENOUS
  Administered 2016-11-11: 25 ug/kg/min via INTRAVENOUS
  Administered 2016-11-11: 20 ug/kg/min via INTRAVENOUS
  Administered 2016-11-12: 40 ug/kg/min via INTRAVENOUS
  Administered 2016-11-12: 35 ug/kg/min via INTRAVENOUS
  Administered 2016-11-12: 30 ug/kg/min via INTRAVENOUS
  Administered 2016-11-12: 40 ug/kg/min via INTRAVENOUS
  Administered 2016-11-12 – 2016-11-13 (×2): 50 ug/kg/min via INTRAVENOUS
  Administered 2016-11-13 (×2): 45 ug/kg/min via INTRAVENOUS
  Administered 2016-11-13: 48 ug/kg/min via INTRAVENOUS
  Administered 2016-11-13 (×3): 45 ug/kg/min via INTRAVENOUS
  Administered 2016-11-14 (×3): 55 ug/kg/min via INTRAVENOUS
  Administered 2016-11-14: 45 ug/kg/min via INTRAVENOUS
  Administered 2016-11-14: 50 ug/kg/min via INTRAVENOUS
  Administered 2016-11-14: 60 ug/kg/min via INTRAVENOUS
  Administered 2016-11-14: 50 ug/kg/min via INTRAVENOUS
  Administered 2016-11-14: 55 ug/kg/min via INTRAVENOUS
  Administered 2016-11-14: 50 ug/kg/min via INTRAVENOUS
  Administered 2016-11-15 (×7): 60 ug/kg/min via INTRAVENOUS
  Administered 2016-11-15: 30 ug/kg/min via INTRAVENOUS
  Administered 2016-11-15 (×3): 60 ug/kg/min via INTRAVENOUS
  Administered 2016-11-16: 50 ug/kg/min via INTRAVENOUS
  Administered 2016-11-16: 20 ug/kg/min via INTRAVENOUS
  Administered 2016-11-16 – 2016-11-17 (×10): 50 ug/kg/min via INTRAVENOUS
  Administered 2016-11-17: 50.024 ug/kg/min via INTRAVENOUS
  Administered 2016-11-18: 50 ug/kg/min via INTRAVENOUS
  Administered 2016-11-18: 25 ug/kg/min via INTRAVENOUS
  Administered 2016-11-18 (×6): 50 ug/kg/min via INTRAVENOUS
  Administered 2016-11-19: 40 ug/kg/min via INTRAVENOUS
  Administered 2016-11-19: 25 ug/kg/min via INTRAVENOUS
  Administered 2016-11-19 (×2): 40 ug/kg/min via INTRAVENOUS
  Administered 2016-11-19: 25 ug/kg/min via INTRAVENOUS
  Administered 2016-11-19 (×2): 40 ug/kg/min via INTRAVENOUS
  Administered 2016-11-20 (×2): 35 ug/kg/min via INTRAVENOUS
  Administered 2016-11-20: 40 ug/kg/min via INTRAVENOUS
  Administered 2016-11-20: 30 ug/kg/min via INTRAVENOUS
  Administered 2016-11-20: 35 ug/kg/min via INTRAVENOUS
  Administered 2016-11-21: 30 ug/kg/min via INTRAVENOUS
  Administered 2016-11-21: 25 ug/kg/min via INTRAVENOUS
  Administered 2016-11-21: 35 ug/kg/min via INTRAVENOUS
  Administered 2016-11-21: 30 ug/kg/min via INTRAVENOUS
  Administered 2016-11-22: 25 ug/kg/min via INTRAVENOUS
  Administered 2016-11-22: 50 ug/kg/min via INTRAVENOUS
  Administered 2016-11-22: 25 ug/kg/min via INTRAVENOUS
  Administered 2016-11-22: 50 ug/kg/min via INTRAVENOUS
  Administered 2016-11-22: 40 ug/kg/min via INTRAVENOUS
  Administered 2016-11-22: 20 ug/kg/min via INTRAVENOUS
  Administered 2016-11-22 – 2016-11-23 (×4): 40 ug/kg/min via INTRAVENOUS
  Filled 2016-11-11 (×17): qty 100
  Filled 2016-11-11: qty 200
  Filled 2016-11-11 (×6): qty 100
  Filled 2016-11-11: qty 200
  Filled 2016-11-11 (×19): qty 100
  Filled 2016-11-11: qty 300
  Filled 2016-11-11 (×28): qty 100
  Filled 2016-11-11: qty 200
  Filled 2016-11-11 (×5): qty 100
  Filled 2016-11-11: qty 200
  Filled 2016-11-11 (×11): qty 100

## 2016-11-11 MED ORDER — MIDAZOLAM HCL 5 MG/ML IJ SOLN
0.5000 mg/h | INTRAMUSCULAR | Status: DC
  Administered 2016-11-11: 0.5 mg/h via INTRAVENOUS
  Filled 2016-11-11 (×2): qty 10

## 2016-11-11 MED ORDER — SODIUM CHLORIDE 0.9 % IV BOLUS (SEPSIS)
500.0000 mL | Freq: Once | INTRAVENOUS | Status: AC
  Administered 2016-11-11: 500 mL via INTRAVENOUS

## 2016-11-11 MED ORDER — HYDROMORPHONE HCL 1 MG/ML IJ SOLN
INTRAMUSCULAR | Status: AC
  Administered 2016-11-11: 1 mg via INTRAVENOUS
  Filled 2016-11-11: qty 1

## 2016-11-11 MED ORDER — HYDROMORPHONE HCL 1 MG/ML IJ SOLN
INTRAMUSCULAR | Status: AC
  Filled 2016-11-11: qty 1

## 2016-11-11 MED ORDER — SUCCINYLCHOLINE CHLORIDE 20 MG/ML IJ SOLN
INTRAMUSCULAR | Status: AC
  Filled 2016-11-11: qty 1

## 2016-11-11 MED ORDER — VECURONIUM BROMIDE 10 MG IV SOLR
10.0000 mg | Freq: Once | INTRAVENOUS | Status: AC
  Administered 2016-11-11: 10 mg via INTRAVENOUS

## 2016-11-11 MED ORDER — ENOXAPARIN SODIUM 40 MG/0.4ML ~~LOC~~ SOLN
40.0000 mg | Freq: Two times a day (BID) | SUBCUTANEOUS | Status: AC
  Administered 2016-11-11 – 2016-11-22 (×21): 40 mg via SUBCUTANEOUS
  Filled 2016-11-11 (×22): qty 0.4

## 2016-11-11 MED ORDER — LACTATED RINGERS IV SOLN
INTRAVENOUS | Status: DC
  Administered 2016-11-11 – 2016-11-16 (×20): via INTRAVENOUS

## 2016-11-11 MED ORDER — MIDAZOLAM HCL 2 MG/2ML IJ SOLN
2.0000 mg | INTRAMUSCULAR | Status: DC | PRN
  Administered 2016-11-11 – 2016-11-12 (×3): 2 mg via INTRAVENOUS
  Filled 2016-11-11 (×3): qty 2

## 2016-11-11 MED ORDER — POTASSIUM CHLORIDE 10 MEQ/100ML IV SOLN
10.0000 meq | INTRAVENOUS | Status: AC
  Administered 2016-11-11 – 2016-11-12 (×3): 10 meq via INTRAVENOUS
  Filled 2016-11-11 (×4): qty 100

## 2016-11-11 MED ORDER — FENTANYL 2500MCG IN NS 250ML (10MCG/ML) PREMIX INFUSION: 10.0000 ug/h | INTRAVENOUS | Status: DC

## 2016-11-11 MED ORDER — IPRATROPIUM-ALBUTEROL 0.5-2.5 (3) MG/3ML IN SOLN
3.0000 mL | Freq: Four times a day (QID) | RESPIRATORY_TRACT | Status: DC
  Administered 2016-11-11 – 2016-11-23 (×48): 3 mL via RESPIRATORY_TRACT
  Filled 2016-11-11 (×49): qty 3

## 2016-11-11 MED ORDER — ROCURONIUM BROMIDE 100 MG/10ML IV SOLN
INTRAVENOUS | Status: DC | PRN
  Administered 2016-11-11 (×2): 50 mg via INTRAVENOUS

## 2016-11-11 MED ORDER — ONDANSETRON HCL 4 MG/2ML IJ SOLN
4.0000 mg | Freq: Once | INTRAMUSCULAR | Status: AC
  Administered 2016-11-11: 4 mg via INTRAVENOUS

## 2016-11-11 MED ORDER — MIDAZOLAM HCL 2 MG/2ML IJ SOLN
4.0000 mg | Freq: Once | INTRAMUSCULAR | Status: AC
  Administered 2016-11-11: 4 mg via INTRAVENOUS

## 2016-11-11 MED ORDER — FENTANYL CITRATE (PF) 100 MCG/2ML IJ SOLN
INTRAMUSCULAR | Status: DC | PRN
  Administered 2016-11-11 (×2): 100 ug via INTRAVENOUS
  Administered 2016-11-11: 50 ug via INTRAVENOUS

## 2016-11-11 MED ORDER — PIPERACILLIN-TAZOBACTAM 3.375 G IVPB
3.3750 g | Freq: Three times a day (TID) | INTRAVENOUS | Status: DC
  Administered 2016-11-11: 3.375 g via INTRAVENOUS
  Filled 2016-11-11 (×2): qty 50

## 2016-11-11 MED ORDER — MIDAZOLAM HCL 2 MG/2ML IJ SOLN
INTRAMUSCULAR | Status: AC
Start: 2016-11-11 — End: 2016-11-11
  Administered 2016-11-11: 4 mg via INTRAVENOUS
  Filled 2016-11-11: qty 4

## 2016-11-11 MED ORDER — MIDAZOLAM HCL 2 MG/2ML IJ SOLN
INTRAMUSCULAR | Status: DC | PRN
  Administered 2016-11-11: 2 mg via INTRAVENOUS

## 2016-11-11 MED ORDER — POTASSIUM CHLORIDE 10 MEQ/100ML IV SOLN
10.0000 meq | INTRAVENOUS | Status: AC
  Administered 2016-11-11 (×2): 10 meq via INTRAVENOUS
  Filled 2016-11-11 (×4): qty 100

## 2016-11-11 MED ORDER — ONDANSETRON HCL 4 MG/2ML IJ SOLN
INTRAMUSCULAR | Status: AC
  Administered 2016-11-11: 4 mg via INTRAVENOUS
  Filled 2016-11-11: qty 2

## 2016-11-11 MED ORDER — MIDAZOLAM HCL 2 MG/2ML IJ SOLN
INTRAMUSCULAR | Status: AC
  Filled 2016-11-11: qty 2

## 2016-11-11 MED ORDER — SODIUM CHLORIDE 0.9 % IV SOLN
10.0000 ug/h | INTRAVENOUS | Status: DC
  Filled 2016-11-11: qty 50

## 2016-11-11 MED ORDER — MORPHINE SULFATE (PF) 4 MG/ML IV SOLN
4.0000 mg | Freq: Once | INTRAVENOUS | Status: AC
  Administered 2016-11-11: 4 mg via INTRAVENOUS

## 2016-11-11 MED ORDER — DEXAMETHASONE SODIUM PHOSPHATE 10 MG/ML IJ SOLN
INTRAMUSCULAR | Status: AC
  Filled 2016-11-11: qty 1

## 2016-11-11 MED ORDER — FENTANYL CITRATE (PF) 100 MCG/2ML IJ SOLN
100.0000 ug | Freq: Once | INTRAMUSCULAR | Status: AC
  Administered 2016-11-11: 100 ug via INTRAVENOUS

## 2016-11-11 MED ORDER — FENTANYL BOLUS VIA INFUSION
50.0000 ug | INTRAVENOUS | Status: DC | PRN
  Administered 2016-11-13 – 2016-11-20 (×17): 50 ug via INTRAVENOUS
  Administered 2016-11-20: 25 ug via INTRAVENOUS
  Administered 2016-11-22 – 2016-11-23 (×5): 50 ug via INTRAVENOUS
  Filled 2016-11-11: qty 50

## 2016-11-11 MED ORDER — HYDROMORPHONE HCL 1 MG/ML IJ SOLN
INTRAMUSCULAR | Status: AC
  Administered 2016-11-11: 2 mg via INTRAVENOUS
  Filled 2016-11-11: qty 1

## 2016-11-11 MED ORDER — MAGNESIUM SULFATE 2 GM/50ML IV SOLN: 2.0000 g | Freq: Once | INTRAVENOUS | Status: DC

## 2016-11-11 MED ORDER — ONDANSETRON 4 MG PO TBDP: 4.0000 mg | ORAL_TABLET | Freq: Four times a day (QID) | ORAL | Status: DC | PRN

## 2016-11-11 MED ORDER — PIPERACILLIN-TAZOBACTAM 4.5 G IVPB
4.5000 g | Freq: Three times a day (TID) | INTRAVENOUS | Status: AC
  Administered 2016-11-11 – 2016-11-20 (×28): 4.5 g via INTRAVENOUS
  Filled 2016-11-11 (×32): qty 100

## 2016-11-11 MED ORDER — SODIUM CHLORIDE 0.9 % IV SOLN
INTRAVENOUS | Status: DC | PRN
  Administered 2016-11-11: 07:00:00 via INTRAVENOUS

## 2016-11-11 MED ORDER — ONDANSETRON HCL 4 MG/2ML IJ SOLN
INTRAMUSCULAR | Status: DC | PRN
  Administered 2016-11-11: 4 mg via INTRAVENOUS

## 2016-11-11 MED ORDER — POTASSIUM CHLORIDE 10 MEQ/50ML IV SOLN
10.0000 meq | INTRAVENOUS | Status: AC
  Administered 2016-11-11: 10 meq via INTRAVENOUS
  Filled 2016-11-11 (×6): qty 50

## 2016-11-11 MED ORDER — MORPHINE SULFATE (PF) 4 MG/ML IV SOLN
INTRAVENOUS | Status: AC
  Administered 2016-11-11: 4 mg via INTRAVENOUS
  Filled 2016-11-11: qty 1

## 2016-11-11 MED ORDER — ALBUMIN HUMAN 25 % IV SOLN
25.0000 g | Freq: Once | INTRAVENOUS | Status: AC
Start: 2016-11-11 — End: 2016-11-11
  Administered 2016-11-11: 25 g via INTRAVENOUS
  Filled 2016-11-11: qty 100

## 2016-11-11 MED ORDER — PROPOFOL 500 MG/50ML IV EMUL
INTRAVENOUS | Status: AC
  Filled 2016-11-11: qty 50

## 2016-11-11 MED ORDER — MAGNESIUM SULFATE 2 GM/50ML IV SOLN
2.0000 g | Freq: Once | INTRAVENOUS | Status: AC
  Administered 2016-11-11: 2 g via INTRAVENOUS
  Filled 2016-11-11: qty 50

## 2016-11-11 MED ORDER — DEXAMETHASONE SODIUM PHOSPHATE 10 MG/ML IJ SOLN
INTRAMUSCULAR | Status: DC | PRN
  Administered 2016-11-11: 10 mg via INTRAVENOUS

## 2016-11-11 MED ORDER — PIPERACILLIN-TAZOBACTAM 4.5 G IVPB
4.5000 g | Freq: Once | INTRAVENOUS | Status: DC
  Filled 2016-11-11: qty 100

## 2016-11-11 MED ORDER — PIPERACILLIN-TAZOBACTAM 3.375 G IVPB 30 MIN: 3.3750 g | Freq: Four times a day (QID) | INTRAVENOUS | Status: DC

## 2016-11-11 MED ORDER — DIPHENHYDRAMINE HCL 50 MG/ML IJ SOLN: 12.5000 mg | Freq: Four times a day (QID) | INTRAMUSCULAR | Status: DC | PRN

## 2016-11-11 MED ORDER — FENTANYL CITRATE (PF) 100 MCG/2ML IJ SOLN
50.0000 ug | Freq: Once | INTRAMUSCULAR | Status: AC
  Administered 2016-11-11: 50 ug via INTRAVENOUS

## 2016-11-11 MED ORDER — STERILE WATER FOR INJECTION IJ SOLN
INTRAMUSCULAR | Status: AC
  Administered 2016-11-11: 13:00:00
  Filled 2016-11-11: qty 10

## 2016-11-11 MED ORDER — IOPAMIDOL (ISOVUE-300) INJECTION 61%
15.0000 mL | INTRAVENOUS | Status: AC
  Administered 2016-11-11: 30 mL via ORAL

## 2016-11-11 MED ORDER — PANTOPRAZOLE SODIUM 40 MG IV SOLR
40.0000 mg | Freq: Two times a day (BID) | INTRAVENOUS | Status: DC
  Administered 2016-11-11 – 2016-11-21 (×22): 40 mg via INTRAVENOUS
  Filled 2016-11-11 (×22): qty 40

## 2016-11-11 MED ORDER — OXYMETAZOLINE HCL 0.05 % NA SOLN
NASAL | Status: AC
  Filled 2016-11-11: qty 15

## 2016-11-11 MED ORDER — FENTANYL 2500MCG IN NS 250ML (10MCG/ML) PREMIX INFUSION
25.0000 ug/h | INTRAVENOUS | Status: DC
  Administered 2016-11-11: 375 ug/h via INTRAVENOUS
  Administered 2016-11-11: 25 ug/h via INTRAVENOUS
  Administered 2016-11-11 – 2016-11-15 (×14): 350 ug/h via INTRAVENOUS
  Administered 2016-11-16: 150 ug/h via INTRAVENOUS
  Administered 2016-11-17 – 2016-11-19 (×7): 225 ug/h via INTRAVENOUS
  Administered 2016-11-20 – 2016-11-22 (×5): 200 ug/h via INTRAVENOUS
  Administered 2016-11-22 – 2016-11-23 (×3): 250 ug/h via INTRAVENOUS
  Filled 2016-11-11 (×32): qty 250

## 2016-11-11 MED ORDER — IOPAMIDOL (ISOVUE-370) INJECTION 76%
100.0000 mL | Freq: Once | INTRAVENOUS | Status: AC | PRN
  Administered 2016-11-11: 100 mL via INTRAVENOUS

## 2016-11-11 MED ORDER — MIDAZOLAM HCL 2 MG/2ML IJ SOLN
2.0000 mg | INTRAMUSCULAR | Status: AC | PRN
  Administered 2016-11-11 – 2016-11-12 (×3): 2 mg via INTRAVENOUS
  Filled 2016-11-11 (×4): qty 2

## 2016-11-11 MED ORDER — VECURONIUM BROMIDE 10 MG IV SOLR
INTRAVENOUS | Status: AC
  Administered 2016-11-11: 10 mg via INTRAVENOUS
  Filled 2016-11-11: qty 10

## 2016-11-11 MED ORDER — LACTATED RINGERS IV SOLN
INTRAVENOUS | Status: DC | PRN
  Administered 2016-11-11: 08:00:00 via INTRAVENOUS

## 2016-11-11 SURGICAL SUPPLY — 45 items
ADHESIVE MASTISOL STRL (MISCELLANEOUS) ×8 IMPLANT
APPLIER CLIP 11 MED OPEN (CLIP)
APPLIER CLIP 13 LRG OPEN (CLIP)
BLADE CLIPPER SURG (BLADE) IMPLANT
BLADE SURG 15 STRL LF DISP TIS (BLADE) IMPLANT
BLADE SURG 15 STRL SS (BLADE)
CANISTER SUCT 3000ML PPV (MISCELLANEOUS) ×2 IMPLANT
CHLORAPREP W/TINT 26ML (MISCELLANEOUS) IMPLANT
CLIP APPLIE 11 MED OPEN (CLIP) IMPLANT
CLIP APPLIE 13 LRG OPEN (CLIP) IMPLANT
DRAPE LAPAROTOMY 100X77 ABD (DRAPES) ×2 IMPLANT
DRAPE TABLE BACK 80X90 (DRAPES) IMPLANT
DRSG TEGADERM 2-3/8X2-3/4 SM (GAUZE/BANDAGES/DRESSINGS) IMPLANT
DRSG TELFA 3X8 NADH (GAUZE/BANDAGES/DRESSINGS) IMPLANT
ELECT BLADE 6.5 EXT (BLADE) ×2 IMPLANT
ELECT REM PT RETURN 9FT ADLT (ELECTROSURGICAL) ×2
ELECTRODE REM PT RTRN 9FT ADLT (ELECTROSURGICAL) ×1 IMPLANT
GAUZE SPONGE 4X4 12PLY STRL (GAUZE/BANDAGES/DRESSINGS) IMPLANT
GLOVE BIO SURGEON STRL SZ7 (GLOVE) ×4 IMPLANT
GOWN STRL REUS W/ TWL LRG LVL3 (GOWN DISPOSABLE) ×4 IMPLANT
GOWN STRL REUS W/TWL LRG LVL3 (GOWN DISPOSABLE) ×4
HANDLE SUCTION POOLE (INSTRUMENTS) IMPLANT
HANDLE YANKAUER SUCT BULB TIP (MISCELLANEOUS) IMPLANT
LIGASURE IMPACT 36 18CM CVD LR (INSTRUMENTS) IMPLANT
NEEDLE HYPO 22GX1.5 SAFETY (NEEDLE) IMPLANT
NEEDLE HYPO 25X1 1.5 SAFETY (NEEDLE) IMPLANT
PACK BASIN MAJOR ARMC (MISCELLANEOUS) ×2 IMPLANT
RELOAD PROXIMATE 75MM BLUE (ENDOMECHANICALS) IMPLANT
SPONGE ABDOMINAL VAC ABTHERA (MISCELLANEOUS) ×2 IMPLANT
SPONGE LAP 18X18 5 PK (GAUZE/BANDAGES/DRESSINGS) ×12 IMPLANT
SPONGE LAP 18X36 2PK (MISCELLANEOUS) IMPLANT
STAPLER PROXIMATE 75MM BLUE (STAPLE) IMPLANT
STAPLER SKIN PROX 35W (STAPLE) IMPLANT
SUCTION POOLE HANDLE (INSTRUMENTS)
SUT PDS AB 1 TP1 96 (SUTURE) IMPLANT
SUT SILK 2 0 (SUTURE) ×1
SUT SILK 2 0 SH CR/8 (SUTURE) ×2 IMPLANT
SUT SILK 2-0 18XBRD TIE 12 (SUTURE) ×1 IMPLANT
SUT VIC AB 0 CT1 36 (SUTURE) IMPLANT
SUT VIC AB 2-0 SH 27 (SUTURE) ×2
SUT VIC AB 2-0 SH 27XBRD (SUTURE) ×2 IMPLANT
SYR 30ML LL (SYRINGE) IMPLANT
SYR 3ML LL SCALE MARK (SYRINGE) IMPLANT
TAPE MICROFOAM 4IN (TAPE) IMPLANT
TRAY FOLEY W/METER SILVER 16FR (SET/KITS/TRAYS/PACK) IMPLANT

## 2016-11-11 NOTE — Progress Notes (Signed)
  Patient seen with Dr. 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 Proceeding emergently to the OR.  Clayburn Pert, MD [facility]  Day [facility] [phone] Night [facility] [phone]

## 2016-11-11 NOTE — Transfer of Care (Signed)
Immediate Anesthesia Transfer of Care Note  Patient: Peter [name]  Procedure(s) Performed: Procedure(s): EXPLORATORY LAPAROTOMY, DEBRIDEMENT OF ABDOMINAL WOUND, ABDOMINAL [place] OUT (N/A)  Patient Location: PACU and ICU  Anesthesia Type:General  Level of Consciousness: sedated and Patient remains intubated per anesthesia plan  Airway & Oxygen Therapy: Patient remains intubated per anesthesia plan and Patient placed on Ventilator (see vital sign flow sheet for setting)  Post-op Assessment: Report given to RN and Post -op Vital signs reviewed and stable  Post vital signs: Reviewed and stable  Last Vitals:  Vitals:   11/11/16 0716 11/11/16 0945  BP: (!) 178/98 (!) 183/96  Pulse: 100 89  Resp: (!) 26 20  Temp:      Last Pain:  Vitals:   11/11/16 0505  TempSrc:   PainSc: 4          Complications: No apparent anesthesia complications

## 2016-11-11 NOTE — Anesthesia Postprocedure Evaluation (Signed)
Anesthesia Post Note  Patient: DENNIES COATE  Procedure(s) Performed: Procedure(s) (LRB): EXPLORATORY LAPAROTOMY, DEBRIDEMENT OF ABDOMINAL WOUND, ABDOMINAL [place] OUT (N/A)  Patient location during evaluation: ICU Anesthesia Type: General Level of consciousness: awake and alert and patient remains intubated per anesthesia plan Pain management: pain level controlled Vital Signs Assessment: post-procedure vital signs reviewed and stable Respiratory status: respiratory function stable and patient on ventilator - see flowsheet for VS Cardiovascular status: blood pressure returned to baseline and stable Postop Assessment: no signs of nausea or vomiting Anesthetic complications: no     Last Vitals:  Vitals:   11/11/16 0945 11/11/16 1000  BP: (!) 183/96 (!) 159/99  Pulse: 89 (!) 115  Resp: 20 16  Temp:      Last Pain:  Vitals:   11/11/16 1000  TempSrc: Axillary  PainSc:                  Molli Barrows

## 2016-11-11 NOTE — ED Provider Notes (Signed)
[facility] Emergency Department Provider Note   ____________________________________________   First MD Initiated Contact with Patient 11/11/16 [phone]     (approximate)  I have reviewed the triage vital signs and the nursing notes.   HISTORY  Chief Complaint Flank Pain   HPI DEMPSEY AHONEN is a 56 y.o. male who was discharged yesterday from the hospital after having an open appendectomy. Due to high risk of wound infection the patient had a wound VAC placed. He says that he woke at 1 AM this morning with sharp right-sided flank pain radiating into his abdomen. He says the pain is stabbing and a 10 out of 10 at this time. He is denying any nausea vomiting or diarrhea.   Past Medical History:  Diagnosis Date  . Blood in stool   . Difficult intubation   . Glaucoma    since 2004  . Hypertension    since 2000  . Obesity, unspecified   . Ulcer 2011   gastric  . Unspecified hemorrhoids without mention of complication     Patient Active Problem List   Diagnosis Date Noted  . Appendicitis, acute 10/30/2016  . Acute appendicitis with generalized peritonitis   . Hyperaldosteronism ([place]) 09/21/2016  . Class 3 obesity with body mass index (BMI) of 50.0 to 59.9 in adult ([place]) 09/21/2016  . Decreased libido 09/21/2016  . Shortness of breath 06/15/2016  . Abnormal stress test 06/15/2016  . 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 Avitaminosis D 03/25/2015  . Blood in stool   . Clinical depression 06/21/2011  . Cannot sleep 08/22/2009  . Hypercholesterolemia without hypertriglyceridemia 01/28/2009  . Hay fever 10/22/2008  . Adjustment disorder with mixed anxiety and depressed mood 12/12/2007  . Essential (primary) hypertension 12/12/2007  . Apnea, sleep 12/12/2007    Past Surgical History:  Procedure Laterality Date  . CARDIAC CATHETERIZATION Left 06/23/2016   Procedure: Left Heart Cath and Coronary Angiography;  Surgeon: Nelva Bush, MD;   Location: [place] [facility];  Service: Cardiovascular;  Laterality: Left;  . CHOLECYSTECTOMY  1992  . COLONOSCOPY W/ POLYPECTOMY  05/22/2010   92mmtransverse colon polyp, traditional serrated adenoma,negative for high grade dysplasia & malignancy. rectal polyp-17mm,negative for dysplasia & malignancy.  Marland Kitchen KNEE SURGERY Left 2010  . LAPAROSCOPIC APPENDECTOMY N/A 10/30/2016   Procedure: APPENDECTOMY LAPAROSCOPIC changed  to open application of wound vac;  Surgeon: [name], MD;  Location: [facility];  Service: General;  Laterality: N/A;    Prior to Admission medications   Medication Sig Start Date End Date Taking? Authorizing Provider  amLODipine (NORVASC) 10 MG tablet TAKE 1 TABLET BY MOUTH EVERY DAY 05/08/16  Yes Jerrol Banana., MD  aspirin EC 81 MG tablet Take 81 mg by mouth daily.   Yes [provider]  atorvastatin (LIPITOR) 40 MG tablet Take 1 tablet (40 mg total) by mouth daily. Patient taking differently: Take 40 mg by mouth every evening.  06/10/16 11/11/17 Yes Wende Bushy, MD  EQUETRO 200 MG CP12 12 hr capsule Take 400-600 mg by mouth 2 (two) times daily. Take 400 mg in the morning & 600 mg in the evening. 03/22/15  Yes [provider]  hydrALAZINE (APRESOLINE) 25 MG tablet TAKE 1 TABLET(25 MG) BY MOUTH THREE TIMES DAILY 10/04/16  Yes Wende Bushy, MD  ibuprofen (ADVIL,MOTRIN) 200 MG tablet Take 800 mg by mouth 3 (three) times daily as needed for headache or moderate pain.   Yes [provider]  lamoTRIgine (LAMICTAL) 200 MG  tablet Take 400 mg by mouth at bedtime.    Yes [provider]  losartan (COZAAR) 100 MG tablet TAKE 1 TABLET BY MOUTH EVERY DAY 05/08/16  Yes Jerrol Banana., MD  LUMIGAN 0.01 % SOLN Place 1 drop into both eyes at bedtime.    Yes [provider]  MAGNESIUM-OXIDE 400 (241.3 Mg) MG tablet Take 400 mg by mouth 2 (two) times daily. 06/05/16  Yes [provider]  Nebivolol HCl 20 MG TABS Take 1 tablet (20  mg total) by mouth every evening. 06/23/16  Yes End, Harrell Gave, MD  OMEGA-3 FATTY ACIDS PO Take 1 capsule by mouth 2 (two) times daily.  11/17/09  Yes [provider]  oxyCODONE (OXY IR/ROXICODONE) 5 MG immediate release tablet Take 1-2 tablets (5-10 mg total) by mouth every 4 (four) hours as needed for severe pain. 11/06/16  Yes Vickie Epley, MD  ranitidine (ZANTAC) 300 MG tablet TAKE 1 TABLET(300 MG) BY MOUTH TWICE DAILY 01/12/16  Yes Jerrol Banana., MD  RUTIN, MISC. NUTRITIONAL FACTORS, TAKE 1 TABLET (500 MG) BY MOUTH DAILY 08/07/09  Yes [provider]  sertraline (ZOLOFT) 100 MG tablet TAKE 1 TABLET BY MOUTH TWICE DAILY Patient taking differently: TAKE 2 TABLETS (200 MG) BY MOUTH DAILY 05/08/16  Yes Jerrol Banana., MD  vitamin C (ASCORBIC ACID) 500 MG tablet Take 500 mg by mouth every evening.    Yes [provider]  Vitamin D, Ergocalciferol, (DRISDOL) 50000 units CAPS capsule Take 1 capsule (50,000 Units total) by mouth every 7 (seven) days. 10/13/16  Yes Jerrol Banana., MD  clobetasol cream (TEMOVATE) 1.61 % Apply 1 application topically at bedtime as needed.    [provider]  fluticasone (FLONASE) 50 MCG/ACT nasal spray Place 2 sprays into both nostrils daily. Patient taking differently: Place 2 sprays into both nostrils daily as needed for allergies.  12/23/15   Chrismon, Vickki Muff, PA  Garlic 096 MG TABS Take 100 mg by mouth 2 (two) times daily.     [provider]  potassium chloride SA (K-DUR,KLOR-CON) 20 MEQ tablet Take 2 tablets (40 mEq total) by mouth 3 (three) times daily. 07/01/16   End, Harrell Gave, MD  spironolactone (ALDACTONE) 50 MG tablet Take 1 tablet (50 mg total) by mouth daily. Patient not taking: Reported on 09/21/2016 07/28/16   Jerrol Banana., MD    Allergies Isosorbide  Family History  Problem Relation Age of Onset  . Cancer Other        colon  . Hyperlipidemia Mother   . Hypertension  Mother     Social History Social History  Substance Use Topics  . Smoking status: Never Smoker  . Smokeless tobacco: Never Used  . Alcohol use Yes     Comment: occasionally    Review of Systems  Constitutional: No fever/chills Eyes: No visual changes. ENT: No sore throat. Cardiovascular: Denies chest pain. Respiratory: Denies shortness of breath. Gastrointestinal:   No nausea, no vomiting.  No diarrhea.  No constipation. Genitourinary: Negative for dysuria. Musculoskeletal: Negative for back pain. Skin: Negative for rash. Neurological: Negative for headaches, focal weakness or numbness.   ____________________________________________   PHYSICAL EXAM:  VITAL SIGNS: ED Triage Vitals  Enc Vitals Group     BP 11/11/16 0303 (!) 156/83     Pulse Rate 11/11/16 0303 100     Resp 11/11/16 0303 17     Temp 11/11/16 0303 97.4 F (36.3 C)  Temp Source 11/11/16 0303 Oral     SpO2 11/11/16 0303 98 %     Weight 11/11/16 0303 (!) 310 lb (140.6 kg)     Height --      Head Circumference --      Peak Flow --      Pain Score 11/11/16 0323 8     Pain Loc --      Pain Edu? --      Excl. in [place]? --     Constitutional: Alert and oriented. Yelling in pain Eyes: Conjunctivae are normal. PERRL. EOMI. Head: Atraumatic. Nose: No congestion/rhinnorhea. Mouth/Throat: Mucous membranes are moist.   Neck: No stridor.   Cardiovascular: Normal rate, regular rhythm. Grossly normal heart sounds.   Respiratory: Normal respiratory effort.  No retractions. Lungs CTAB. Gastrointestinal: Soft tenderness around the area of the wound VAC. Margins around the wound VAC without erythema. No pus drainage around the wound VAC. No distention. Positive for right-sided CVA tenderness to palpation. Musculoskeletal: Mild edema to bilateral lower extremities which is equal bilaterally.  No joint effusions. Neurologic:  Normal speech and language. No gross focal neurologic deficits are appreciated.  Skin:   Skin is warm, dry and intact. No rash noted. Psychiatric: Mood and affect are normal. Speech and behavior are normal.  ____________________________________________   LABS (all labs ordered are listed, but only abnormal results are displayed)  Labs Reviewed  COMPREHENSIVE METABOLIC PANEL - Abnormal; Notable for the following:       Result Value   Potassium 2.7 (*)    Chloride 100 (*)    Glucose, Bld 131 (*)    Calcium 8.0 (*)    Total Protein 6.3 (*)    Albumin 2.5 (*)    AST 53 (*)    All other components within normal limits  CBC - Abnormal; Notable for the following:    WBC 14.8 (*)    RBC 4.32 (*)    Hemoglobin 12.5 (*)    HCT 36.9 (*)    All other components within normal limits  LIPASE, BLOOD  TROPONIN I  URINALYSIS, COMPLETE (UACMP) WITH MICROSCOPIC   ____________________________________________  EKG  ED ECG REPORT I, Doran Stabler, the attending physician, personally viewed and interpreted this ECG.   Date: 11/11/2016  EKG Time: 0306  Rate: 98  Rhythm: normal sinus rhythm  Axis: normal  Intervals:prolonged qt  ST&T Change: T wave inversions in 3 as well as aVF. No ST elevation or depression. Similar T wave morphologies when compared to the EKG is 11/21 05/04/2016. ____________________________________________  RADIOLOGY   IMPRESSION: 1. Open ventral lower abdominal wall wound and broad-based left lower quadrant abdominal wall hernia at the deep margin of the open wound measuring approximately 13 x 15 cm (ML by CC). Through the herniation extending into the open wound are loops of small and large bowel which demonstrate wall thickening and surrounding inflammatory changes. There is proximal mild small bowel obstruction. 2. Fat stranding in the right lower quadrant appendectomy bed may represent phlegmonous or postsurgical changes. No peritoneal abscess identified. 3. Left adrenal hypodense nodule measures 8 Hounsfield units, probably an adenoma  or scarring. 4. 4-5 mm pulmonary nodules along right major fissure. No follow-up needed if patient is low-risk (and has no known or suspected primary neoplasm). Non-contrast chest CT can be considered in 12 months if patient is high-risk. This recommendation follows the consensus statement: Guidelines for Management of Incidental Pulmonary Nodules Detected on CT Images: From the Fleischner Society 2017; Radiology  2017; 174:944-967.   Electronically Signed By: Kristine Garbe M.D. On: 11/11/2016 06:05            ____________________________________________   PROCEDURES  Procedure(s) performed:   Procedures  Critical Care performed:   ____________________________________________   INITIAL IMPRESSION / ASSESSMENT AND PLAN / ED COURSE  Pertinent labs & imaging results that were available during my care of the patient were reviewed by me and considered in my medical decision making (see chart for details).  ----------------------------------------- 6:31 AM on 11/11/2016 -----------------------------------------  Patient with persistent pain requiring multiple IV pain meds dosages. CT resulted with herniation of bowel into the ventral wound with proximal mild small bowel obstruction. Discussed the case with the surgeon, Dr. Dahlia Byes, and he will be down to see the patient. Likely, the patient will need admission for pain control at the least but given the findings on the CAT scan is concerning for a more serious acute process. Explain the results to the patient as well as family. Wife is now the bedside. They're understanding and willing to comply. The herniation is not evident on gross examination. On scanning it appears deep to the wound VAC.      ____________________________________________   FINAL CLINICAL IMPRESSION(S) / ED DIAGNOSES  Ventral hernia. Small bowel obstruction.    NEW MEDICATIONS STARTED DURING THIS VISIT:  New Prescriptions   No  medications on file     Note:  This document was prepared using Dragon voice recognition software and may include unintentional dictation errors.    Orbie Pyo, MD 11/11/16 [phone]

## 2016-11-11 NOTE — H&P (Signed)
Patient ID: Peter [name], male   DOB: 1960/07/15, 56 y.o.   MRN: [id number]  HPI Peter [name] is a 56 y.o. male 38 this out after exploratory laparotomy for complicated appendicitis with a prolonged hospital course due to his super morbid obesity. We decided to put a wound VAC in his abdominal wound. He went home 4 days ago he was doing okay until early this morning when he woke With severe abdominal pain after going to the bathroom. Now the pain is severe, constant and.  nausea and decreased appetite. No fevers, no chills. CT scan personally reviewed evidence of evisceration w SBO. Post inflammatory changes.  HPI  Past Medical History:  Diagnosis Date  . Blood in stool   . Difficult intubation   . Glaucoma    since 2004  . Hypertension    since 2000  . Obesity, unspecified   . Ulcer 2011   gastric  . Unspecified hemorrhoids without mention of complication     Past Surgical History:  Procedure Laterality Date  . CARDIAC CATHETERIZATION Left 06/23/2016   Procedure: Left Heart Cath and Coronary Angiography;  Surgeon: Nelva Bush, MD;  Location: [place] [facility];  Service: Cardiovascular;  Laterality: Left;  . CHOLECYSTECTOMY  1992  . COLONOSCOPY W/ POLYPECTOMY  05/22/2010   43mmtransverse colon polyp, traditional serrated adenoma,negative for high grade dysplasia & malignancy. rectal polyp-5mm,negative for dysplasia & malignancy.  Marland Kitchen KNEE SURGERY Left 2010  . LAPAROSCOPIC APPENDECTOMY N/A 10/30/2016   Procedure: APPENDECTOMY LAPAROSCOPIC changed  to open application of wound vac;  Surgeon: [name], MD;  Location: [facility];  Service: General;  Laterality: N/A;    Family History  Problem Relation Age of Onset  . Cancer Other        colon  . Hyperlipidemia Mother   . Hypertension Mother     Social History Social History  Substance Use Topics  . Smoking status: Never Smoker  . Smokeless tobacco: Never Used  . Alcohol use Yes     Comment: occasionally     Allergies  Allergen Reactions  . Isosorbide     Headache, nausea, vomiting    Current Facility-Administered Medications  Medication Dose Route Frequency Provider Last Rate Last Dose  . piperacillin-tazobactam (ZOSYN) IVPB 3.375 g  3.375 g Intravenous Q6H Samhitha Rosen F, MD      . potassium chloride 10 mEq in 100 mL IVPB  10 mEq Intravenous Q1 Hr x 4 Orbie Pyo, MD 100 mL/hr at 11/11/16 0530 10 mEq at 11/11/16 0530   Current Outpatient Prescriptions  Medication Sig Dispense Refill  . amLODipine (NORVASC) 10 MG tablet TAKE 1 TABLET BY MOUTH EVERY DAY 90 tablet 2  . aspirin EC 81 MG tablet Take 81 mg by mouth daily.    Marland Kitchen atorvastatin (LIPITOR) 40 MG tablet Take 1 tablet (40 mg total) by mouth daily. (Patient taking differently: Take 40 mg by mouth every evening. ) 30 tablet 6  . EQUETRO 200 MG CP12 12 hr capsule Take 400-600 mg by mouth 2 (two) times daily. Take 400 mg in the morning & 600 mg in the evening.  7  . hydrALAZINE (APRESOLINE) 25 MG tablet TAKE 1 TABLET(25 MG) BY MOUTH THREE TIMES DAILY 90 tablet 1  . ibuprofen (ADVIL,MOTRIN) 200 MG tablet Take 800 mg by mouth 3 (three) times daily as needed for headache or moderate pain.    Marland Kitchen lamoTRIgine (LAMICTAL) 200 MG tablet Take 400 mg by mouth at bedtime.     Marland Kitchen  losartan (COZAAR) 100 MG tablet TAKE 1 TABLET BY MOUTH EVERY DAY 90 tablet 2  . LUMIGAN 0.01 % SOLN Place 1 drop into both eyes at bedtime.     Marland Kitchen MAGNESIUM-OXIDE 400 (241.3 Mg) MG tablet Take 400 mg by mouth 2 (two) times daily.  12  . Nebivolol HCl 20 MG TABS Take 1 tablet (20 mg total) by mouth every evening. 30 tablet 5  . OMEGA-3 FATTY ACIDS PO Take 1 capsule by mouth 2 (two) times daily.     Marland Kitchen oxyCODONE (OXY IR/ROXICODONE) 5 MG immediate release tablet Take 1-2 tablets (5-10 mg total) by mouth every 4 (four) hours as needed for severe pain. 30 tablet 0  . ranitidine (ZANTAC) 300 MG tablet TAKE 1 TABLET(300 MG) BY MOUTH TWICE DAILY 60 tablet 12  . RUTIN, MISC.  NUTRITIONAL FACTORS, TAKE 1 TABLET (500 MG) BY MOUTH DAILY    . sertraline (ZOLOFT) 100 MG tablet TAKE 1 TABLET BY MOUTH TWICE DAILY (Patient taking differently: TAKE 2 TABLETS (200 MG) BY MOUTH DAILY) 180 tablet 2  . vitamin C (ASCORBIC ACID) 500 MG tablet Take 500 mg by mouth every evening.     . Vitamin D, Ergocalciferol, (DRISDOL) 50000 units CAPS capsule Take 1 capsule (50,000 Units total) by mouth every 7 (seven) days. 12 capsule 0  . clobetasol cream (TEMOVATE) 8.41 % Apply 1 application topically at bedtime as needed.    . fluticasone (FLONASE) 50 MCG/ACT nasal spray Place 2 sprays into both nostrils daily. (Patient taking differently: Place 2 sprays into both nostrils daily as needed for allergies. ) 16 g 6  . Garlic 324 MG TABS Take 100 mg by mouth 2 (two) times daily.     . potassium chloride SA (K-DUR,KLOR-CON) 20 MEQ tablet Take 2 tablets (40 mEq total) by mouth 3 (three) times daily. 180 tablet 4  . spironolactone (ALDACTONE) 50 MG tablet Take 1 tablet (50 mg total) by mouth daily. (Patient not taking: Reported on 09/21/2016) 30 tablet 12     Review of Systems Full ROS  was asked and was negative except for the information on the HPI  Physical Exam Blood pressure 106/80, pulse (!) 107, temperature 97.4 F (36.3 C), temperature source Oral, resp. rate 18, weight (!) 140.6 kg (310 lb), SpO2 96 %. CONSTITUTIONAL: super morbidly obese  EYES: Pupils are equal, round, , Sclera are non-icteric. LYMPH NODES:  Lymph nodes in the neck are normal. RESPIRATORY:  Lungs are clear. There is normal respiratory effort, with equal breath sounds bilaterally, and without pathologic use of accessory muscles. CARDIOVASCULAR: Heart is regular without murmurs, gallops, or rubs. GI: Wound vac removed, there is good granulation tissue in wound bed, Eviscerated bowel viable. There wound is foul smelling. His abdomen is tender diffusely. GU: Rectal deferred.   MUSCULOSKELETAL: Normal muscle strength and  tone. No cyanosis or edema.   SKIN: Turgor is good and there are no pathologic skin lesions or ulcers. NEUROLOGIC: Motor and sensation is grossly normal. Cranial nerves are grossly intact. PSYCH:  Oriented to person, place and time. Affect is normal.  Data Reviewed  I have personally reviewed the patient's imaging, laboratory findings and medical records.    Assessment/ Plan 56 year old super morbidly obese male status for laparotomy and appendectomy on 4/28 now with evisceration of bowel in need for emergent laparotomy to correct evisceration and to assess the bowel. We'll start aggressive crystalloid resuscitation, IV antibiotics place an NG tube. I have already discussed with the or about the emergency  of this case. Discussed with the patient in detail about the risks benefits and possible complications including but not limited to: Bleeding, infection, recurrence of evisceration, hernias, bowel injuries and problems with anesthesia. He understands and wishes to proceed.  Caroleen Hamman, MD FACS General Surgeon 11/11/2016, 6:53 AM

## 2016-11-11 NOTE — Progress Notes (Signed)
56 y/o M s/p ex lap appendectomy admitted with evisceration. Patient ordered Zosyn 4.5 g every 8 hours over 30 minutes.   Filed Weights   11/11/16 0303  Weight: (!) 310 lb (140.6 kg)   Estimated Creatinine Clearance: 91 mL/min (by C-G formula based on SCr of 1.2 mg/dL).  Will change Zosyn to 4.5 g EI q 8 hours as per policy.   Ulice Dash, PharmD Clinical Pharmacist

## 2016-11-11 NOTE — Procedures (Signed)
Central Venous Catheter Placement: Indication: Patient receiving vesicant or irritant drug.; Patient receiving intravenous therapy for longer than 5 days.; Patient has limited or no vascular access.   Consent:verbal/written  Risks and benefits explained in detail including risk of infection, bleeding, respiratory failure and death..   Hand washing performed prior to starting the procedure.   Procedure: An active timeout was performed and correct patient, name, & ID confirmed.  After explaining risk and benefits, patient was positioned correctly for central venous access. Patient was prepped using strict sterile technique including chlorohexadine preps, sterile drape, sterile gown and sterile gloves.  The area was prepped, draped and anesthetized in the usual sterile manner. Patient comfort was obtained.  A triple lumen catheter was placed in LEFT Internal Jugular Vein There was good blood return, catheter caps were placed on lumens, catheter flushed easily, the line was secured and a sterile dressing and BIO-PATCH applied.   Ultrasound was used to visualize vasculature and guidance of needle.   Number of Attempts: 1 Complications:none Estimated Blood Loss: none Chest Radiograph indicated and ordered.  Operator: Matther Labell/Blakeny   Floy Riegler David Tamaj Jurgens, M.D.  [facility]  Medical Director ICU-[facility] [place] Medical Director [facility] Cardio[facility] Department     

## 2016-11-11 NOTE — Anesthesia Preprocedure Evaluation (Addendum)
Anesthesia Evaluation  Patient identified by MRN, date of birth, ID band Patient awake    Reviewed: Allergy & Precautions, H&P , NPO status , Patient's Chart, lab work & pertinent test results, reviewed documented beta blocker date and time   History of Anesthesia Complications (+) DIFFICULT AIRWAY  Airway Mallampati: IV  TM Distance: >3 FB Neck ROM: full    Dental  (+) Teeth Intact   Pulmonary neg pulmonary ROS, shortness of breath and with exertion, sleep apnea and Continuous Positive Airway Pressure Ventilation ,    Pulmonary exam normal        Cardiovascular hypertension, negative cardio ROS Normal cardiovascular exam Rhythm:regular Rate:Normal     Neuro/Psych PSYCHIATRIC DISORDERS Depression negative neurological ROS  negative psych ROS   GI/Hepatic negative GI ROS, Neg liver ROS,   Endo/Other  negative endocrine ROS  Renal/GU negative Renal ROS  negative genitourinary   Musculoskeletal   Abdominal   Peds  Hematology negative hematology ROS (+)   Anesthesia Other Findings Past Medical History: No date: Blood in stool No date: Difficult intubation No date: Glaucoma     Comment: since 2004 No date: Hypertension     Comment: since 2000 No date: Obesity, unspecified 2011: Ulcer     Comment: gastric No date: Unspecified hemorrhoids without mention of com* Past Surgical History: 06/23/2016: CARDIAC CATHETERIZATION Left     Comment: Procedure: Left Heart Cath and Coronary               Angiography;  Surgeon: Nelva Bush, MD;                Location: [place] [facility];  Service:               Cardiovascular;  Laterality: Left; 1992: CHOLECYSTECTOMY 05/22/2010: COLONOSCOPY W/ POLYPECTOMY     Comment: 56mmtransverse colon polyp, traditional               serrated adenoma,negative for high grade               dysplasia & malignancy. rectal               polyp-68mm,negative for dysplasia &  malignancy. 2010: KNEE SURGERY Left 10/30/2016: LAPAROSCOPIC APPENDECTOMY N/A     Comment: Procedure: APPENDECTOMY LAPAROSCOPIC changed                to open application of wound vac;  Surgeon:               [name], MD;  Location: [facility];                Service: General;  Laterality: N/A; BMI    Body Mass Index:  48.55 kg/m     Reproductive/Obstetrics negative OB ROS                             Anesthesia Physical Anesthesia Plan  ASA: III and emergent  Anesthesia Plan: General ETT   Post-op Pain Management:    Induction:   Airway Management Planned:   Additional Equipment:   Intra-op Plan:   Post-operative Plan:   Informed Consent: I have reviewed the patients History and Physical, chart, labs and discussed the procedure including the risks, benefits and alternatives for the proposed anesthesia with the patient or authorized representative who has indicated his/her understanding and acceptance.   Dental Advisory Given  Plan Discussed with: CRNA  Anesthesia Plan Comments:  Anesthesia Quick Evaluation  

## 2016-11-11 NOTE — Op Note (Addendum)
PROCEDURES: 1. Laparotomy 2. Abdominal wall debridement of skin, subq, muscle and fascia. 30x 10x 15 cms depth 300cm2 3. 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 with endotracheal tube  Assistant:Dr. Adonis Huguenin  Findings: Soft tissue infection of the abdominal wall causing the evisceration. Please note that most of the necrotizing infection was to the left side of the abdominal wall. There was no evidence of bowel injury or intra-abdominal abscess. There was an area of the small bowel that was trapped within the abdominal wall causing the bowel obstruction, no definitive evidence of necrosis of that segment of bowel but there was a contusion only  Estimated Blood Loss: 50cc               Specimens: Abdominal wall soft tissue       Complications: none   Procedure Details  Mr Peter [name] is 10 days out from open appendectomy. He is super morbidly obese and presented this morning with severe abdominal pain and containing evisceration from his wound. The benefits, complications, treatment options, and expected outcomes were discussed with the patient. The risks of bleeding, infection, recurrence of symptoms, failure to resolve symptoms,  bowel injury, any of which could require further surgery were reviewed with the patient.   The patient was taken to Operating Room, identified as [name] and the procedure verified.  A Time Out was held and the above information confirmed.  Prior to the induction of general anesthesia, antibiotic prophylaxis was administered. VTE prophylaxis was in place. General endotracheal anesthesia was then administered and tolerated well. After the induction, the  abdomen was prepped with Chloraprep and draped in the sterile fashion. The patient was positioned in the supine position.  Were able to visualize the loops of bowel and increase her incision in cephalad fashion to obtain better visualization. This was performed with a 10 blade knife and subcutaneous tissue was dissected with electrocautery. We perform extensive lysis of adhesions from the abdominal wall to the small bowel and also interloop small bowel using finger fracturing. There was evidence of foul-smelling and necrotizing infection to the abdominal wall involving the subcutaneous tissue the muscle and the fascia. We were able to continue our dissection with finger and fracturing the adhesions that he did have. After a lengthy lysis of adhesions were able to gain adequate exposure and run the small bowel. An the proximal small bowel was very healthy mildly dilated and the distal small bowel there was an area where the previous was caught between the fascia causing a small bowel obstruction with some exudate and some contusion. There was no evidence of full necrosis of the small bowel and the mesentery had good blood supply given the circumstances we decided not to perform a bowel resection and given the fact that he had a necrotizing soft tissue infection we decided to keep his abdomen open. Using a combination of scalpel and large curettes and Mayo scissors we debrided the abdominal wall incorporated the skin the subcutaneous tissues tissue the muscle and the fascia the majority of the necrotizing infection was to the left of the abdominal wall.  Please note that the sutures were intact and the knots were intact there were multiple internal retention sutures that were  removed in the standard fashion. Were able to get good viable margins out of the resection of the debridement and obtain hemostasis with electrocautery. Once again we did not visualize any intraluminal abscess or any bowel injury. The   Abthera wound VAC was tailored to his abdominal wall and were able to place it in the standard fashion. There was no evidence of any leak and we connected to the suction device at 125 mm of suction. No immediate complications. Patient will be left intubated and will bring him back tomorrow for a second look and possibly further debridement.     Caroleen Hamman, MD, FACS

## 2016-11-11 NOTE — Progress Notes (Signed)
Initial Nutrition Assessment  DOCUMENTATION CODES:   Morbid obesity  INTERVENTION:  1. Monitor for initiation of nutrition support  NUTRITION DIAGNOSIS:   Inadequate oral intake related to inability to eat as evidenced by NPO status  GOAL:   Provide needs based on ASPEN/SCCM guidelines  MONITOR:   I & O's, Labs, Skin, Vent status  REASON FOR ASSESSMENT:   Ventilator    ASSESSMENT:   Peter [name] is a 56 y.o. male who had a laparotomy and appendectomy 4/28 returns now with eviscerated bowel s/p ex lap, abd wash out, abd wall debridement  Patient is currently intubated on ventilator support MV: 8 L/min Temp (24hrs), Avg:97.4 F (36.3 C), Min:97.4 F (36.3 C), Max:97.4 F (36.3 C) Propofol: 21 ml/hr --> 554 caloreis Abd wound vac Morbid obesity 12#/3.7% insignificant wt loss over 2 months. Nutrition-Focused physical exam completed. Findings are no fat depletion, no muscle depletion, and no edema.  Labs and medications reviewed: CBGs 131 K 2.7, Mg 1.6  Diet Order:  Diet NPO time specified  Skin:  Reviewed, no issues  Last BM:  11/04/2016  Height:   Ht Readings from Last 1 Encounters:  11/11/16 5\' 7"  (1.702 m)    Weight:   Wt Readings from Last 1 Encounters:  11/11/16 (!) 310 lb (140.6 kg)    Ideal Body Weight:  67.27 kg  BMI:  Body mass index is 48.55 kg/m.  Estimated Nutritional Needs:   Kcal:  9485-4627 calories  Protein:  >/= 168 gm  Fluid:  Per MD/NP/PA  EDUCATION NEEDS:   Education needs no appropriate at this time  Satira Anis. Barrington Worley, MS, RD LDN Inpatient Clinical Dietitian Pager [phone]

## 2016-11-11 NOTE — Consult Note (Signed)
PULMONARY / CRITICAL CARE MEDICINE   Name: Peter [name] MRN: [id number] DOB: [birth year]    ADMISSION DATE:  11/11/2016 CONSULTATION DATE:  11/11/2016  REFERRING MD:  Dr. Dahlia Byes   CHIEF COMPLAINT: S/P emergent exploratory laparotomy, debridement of abdominal wound, and abdominal wash out  HISTORY OF PRESENT ILLNESS:   This is a 56 yo male with PMH of Gastric Ulcer, Obesity, HTN, Glaucoma, Difficult Intubation, and Blood in Stool.  He presented to [facility] ER 05/10 with 10/10 sharp right sided flank pain radiating into the abdomen onset this am 05/10 at 1 am with evisceration of bowel.  He is s/p open appendectomy on 04/28 he was discharged from [facility] with a midline abdominal wound vac due to high risk of wound infection.  Surgical team consulted 05/10 and pt taken emergently to OR for laparotomy due to soft tissue infection of the abdominal wall at previous surgical site causing evisceration with necrotizing infection to the left side of the abdominal wall.  During laparotomy abdominal wall debridement of skin, subq, muscle, and facia was performed and abthera abdominal wound vac placed.  Pt remained mechanically intubated and admitted to ICU for further workup and management  PAST MEDICAL HISTORY :  He  has a past medical history of Blood in stool; Difficult intubation; Glaucoma; Hypertension; Obesity, unspecified; Ulcer (2011); and Unspecified hemorrhoids without mention of complication.  PAST SURGICAL HISTORY: He  has a past surgical history that includes Knee surgery (Left, 2010); Cholecystectomy (1992); Colonoscopy w/ polypectomy (05/22/2010); Cardiac catheterization (Left, 06/23/2016); and laparoscopic appendectomy (N/A, 10/30/2016).  Allergies  Allergen Reactions  . Isosorbide     Headache, nausea, vomiting    No current facility-administered medications on file prior to encounter.    Current Outpatient Prescriptions on File Prior to Encounter  Medication Sig  . amLODipine (NORVASC)  10 MG tablet TAKE 1 TABLET BY MOUTH EVERY DAY  . aspirin EC 81 MG tablet Take 81 mg by mouth daily.  Marland Kitchen atorvastatin (LIPITOR) 40 MG tablet Take 1 tablet (40 mg total) by mouth daily. (Patient taking differently: Take 40 mg by mouth every evening. )  . EQUETRO 200 MG CP12 12 hr capsule Take 400-600 mg by mouth 2 (two) times daily. Take 400 mg in the morning & 600 mg in the evening.  . hydrALAZINE (APRESOLINE) 25 MG tablet TAKE 1 TABLET(25 MG) BY MOUTH THREE TIMES DAILY  . ibuprofen (ADVIL,MOTRIN) 200 MG tablet Take 800 mg by mouth 3 (three) times daily as needed for headache or moderate pain.  Marland Kitchen lamoTRIgine (LAMICTAL) 200 MG tablet Take 400 mg by mouth at bedtime.   Marland Kitchen losartan (COZAAR) 100 MG tablet TAKE 1 TABLET BY MOUTH EVERY DAY  . LUMIGAN 0.01 % SOLN Place 1 drop into both eyes at bedtime.   Marland Kitchen MAGNESIUM-OXIDE 400 (241.3 Mg) MG tablet Take 400 mg by mouth 2 (two) times daily.  . Nebivolol HCl 20 MG TABS Take 1 tablet (20 mg total) by mouth every evening.  Marland Kitchen OMEGA-3 FATTY ACIDS PO Take 1 capsule by mouth 2 (two) times daily.   Marland Kitchen oxyCODONE (OXY IR/ROXICODONE) 5 MG immediate release tablet Take 1-2 tablets (5-10 mg total) by mouth every 4 (four) hours as needed for severe pain.  . ranitidine (ZANTAC) 300 MG tablet TAKE 1 TABLET(300 MG) BY MOUTH TWICE DAILY  . RUTIN, MISC. NUTRITIONAL FACTORS, TAKE 1 TABLET (500 MG) BY MOUTH DAILY  . sertraline (ZOLOFT) 100 MG tablet TAKE 1 TABLET BY MOUTH TWICE DAILY (Patient taking differently: TAKE  2 TABLETS (200 MG) BY MOUTH DAILY)  . vitamin C (ASCORBIC ACID) 500 MG tablet Take 500 mg by mouth every evening.   . Vitamin D, Ergocalciferol, (DRISDOL) 50000 units CAPS capsule Take 1 capsule (50,000 Units total) by mouth every 7 (seven) days.  . clobetasol cream (TEMOVATE) 8.82 % Apply 1 application topically at bedtime as needed.  . fluticasone (FLONASE) 50 MCG/ACT nasal spray Place 2 sprays into both nostrils daily. (Patient taking differently: Place 2 sprays  into both nostrils daily as needed for allergies. )  . Garlic 800 MG TABS Take 100 mg by mouth 2 (two) times daily.   . potassium chloride SA (K-DUR,KLOR-CON) 20 MEQ tablet Take 2 tablets (40 mEq total) by mouth 3 (three) times daily.  Marland Kitchen spironolactone (ALDACTONE) 50 MG tablet Take 1 tablet (50 mg total) by mouth daily. (Patient not taking: Reported on 09/21/2016)    FAMILY HISTORY:  His indicated that his mother is deceased. He indicated that his father is deceased. He indicated that both of his sisters are alive. He indicated that his other is deceased.    SOCIAL HISTORY: He  reports that he has never smoked. He has never used smokeless tobacco. He reports that he drinks alcohol. He reports that he does not use drugs.  REVIEW OF SYSTEMS:   Unable to assess pt intubated   SUBJECTIVE:  Unable to assess pt intubated   VITAL SIGNS: BP (!) 178/98   Pulse 100   Temp 97.4 F (36.3 C) (Oral)   Resp (!) 26   Wt (!) 140.6 kg (310 lb)   SpO2 96%   BMI 48.55 kg/m   HEMODYNAMICS:    VENTILATOR SETTINGS:    INTAKE / OUTPUT: No intake/output data recorded.  PHYSICAL EXAMINATION: General: obese Caucasian critically ill male, NAD mechanically intubated Neuro: sedated, withdraws from painful stimulation, PERRL HEENT: large short neck  Cardiovascular: sinus tach, s1s2, no M/R/G Lungs: rhonchi and diminished throughout, even, non labored Abdomen: no audible BS, midline abdominal wound vac in place, obese Musculoskeletal: normal bulk and tone, no edema Skin: large midline abdominal wound vac in place dry and intact  LABS:  BMET  Recent Labs Lab 11/06/16 0533 11/11/16 0305  NA  --  138  K  --  2.7*  CL  --  100*  CO2  --  24  BUN  --  16  CREATININE 0.47* 1.20  GLUCOSE  --  131*    Electrolytes  Recent Labs Lab 11/11/16 0305  CALCIUM 8.0*    CBC  Recent Labs Lab 11/05/16 0644 11/11/16 0305  WBC 9.5 14.8*  HGB 11.7* 12.5*  HCT 33.5* 36.9*  PLT 223 440     Coag's No results for input(s): APTT, INR in the last 168 hours.  Sepsis Markers No results for input(s): LATICACIDVEN, PROCALCITON, O2SATVEN in the last 168 hours.  ABG No results for input(s): PHART, PCO2ART, PO2ART in the last 168 hours.  Liver Enzymes  Recent Labs Lab 11/11/16 0305  AST 53*  ALT 37  ALKPHOS 96  BILITOT 1.2  ALBUMIN 2.5*    Cardiac Enzymes  Recent Labs Lab 11/11/16 0305  TROPONINI <0.03    Glucose No results for input(s): GLUCAP in the last 168 hours.  Imaging Ct Abdomen Pelvis W Contrast  Result Date: 11/11/2016 CLINICAL DATA:  56 y/o M; right flank and abdominal pain. Appendectomy 10 days ago. EXAM: CT ABDOMEN AND PELVIS WITH CONTRAST TECHNIQUE: Multidetector CT imaging of the abdomen and pelvis was performed  using the standard protocol following bolus administration of intravenous contrast. CONTRAST:  100 cc Isovue 3 7 COMPARISON:  None. FINDINGS: Lower chest: Stable 4-5 mm nodules along the right major fissure (series 4, image 7 and 5). Hepatobiliary: No focal liver abnormality is seen. Status post cholecystectomy. No biliary dilatation. Pancreas: Unremarkable. No pancreatic ductal dilatation or surrounding inflammatory changes. Spleen: Normal in size without focal abnormality. Adrenals/Urinary Tract: Left adrenal scarring probably representing sequelae of prior infection or hemorrhage. Nonspecific 13 mm hypodense focus within the left adrenal gland measures 8 Hounsfield units on the coronal reconstruction (series 5, image 65) and may represent an adenoma or possibly scarring. Stable left kidney parapelvic cysts. No hydronephrosis or obstructive uropathy. Normal bladder. Stomach/Bowel: Open ventral lower abdominal wall an broad-based left lower quadrant herniation in the abdominal wall measuring approximately 13 x 15 cm (ML by CC). Through the herniation extending into the open wound I loops of small and large bowel which demonstrate wall thickening  and surrounding inflammatory changes. There is proximal mild small bowel obstruction. Additionally, there is fluid and small foci of air within the deep portion of the abdominal wound near the pubic symphysis without Organization into a discrete abscess. In the right lower quadrant appendectomy bed there is stranding of fat without a discrete fluid collection which may represent phlegmonous or postsurgical changes. Vascular/Lymphatic: No significant vascular findings are present. No enlarged abdominal or pelvic lymph nodes. Reproductive: Central prostatic calcification. Other: Small volume of peritoneal ascites. Musculoskeletal: No fracture is seen. IMPRESSION: 1. Open ventral lower abdominal wall wound and broad-based left lower quadrant abdominal wall hernia at the deep margin of the open wound measuring approximately 13 x 15 cm (ML by CC). Through the herniation extending into the open wound are loops of small and large bowel which demonstrate wall thickening and surrounding inflammatory changes. There is proximal mild small bowel obstruction. 2. Fat stranding in the right lower quadrant appendectomy bed may represent phlegmonous or postsurgical changes. No peritoneal abscess identified. 3. Left adrenal hypodense nodule measures 8 Hounsfield units, probably an adenoma or scarring. 4. 4-5 mm pulmonary nodules along right major fissure. No follow-up needed if patient is low-risk (and has no known or suspected primary neoplasm). Non-contrast chest CT can be considered in 12 months if patient is high-risk. This recommendation follows the consensus statement: Guidelines for Management of Incidental Pulmonary Nodules Detected on CT Images: From the Fleischner Society 2017; Radiology 2017; 284:228-243. Electronically Signed   By: Kristine Garbe M.D.   On: 11/11/2016 06:05   STUDIES:  CT Abd Pelvis 05/10>>Open ventral lower abdominal wall wound and broad-based left lower quadrant abdominal wall hernia at the  deep margin of the open wound measuring approximately 13 x 15 cm (ML by CC). Through the herniation extending into the open wound are loops of small and large bowel which demonstrate wall thickening and surrounding inflammatory changes. There is proximal mild small bowel obstruction. Fat stranding in the right lower quadrant appendectomy bed may represent phlegmonous or postsurgical changes. No peritoneal abscess identified. Left adrenal hypodense nodule measures 8 Hounsfield units, probably an adenoma or scarring. 4-5 mm pulmonary nodules along right major fissure. No follow-up needed if patient is low-risk (and has no known or suspected primary neoplasm). Non-contrast chest CT can be considered in 12 months if patient is high-risk. This recommendation follows the consensus statement: Guidelines for Management of Incidental Pulmonary Nodules Detected on CT Images: From the Fleischner Society 2017; Radiology 2017; [phone]  CULTURES: Aerobic/Anaerobic Abd Wound 05/10>>  Blood x2 05/10>>  ANTIBIOTICS: Zosyn 05/10>>  SIGNIFICANT EVENTS: 05/10-Pt admitted to ICU intubated post/op  LINES/TUBES: ETT 05/10>> Left IJ CVL 05/10>>  ASSESSMENT / PLAN:  PULMONARY A: Mechanical Intubation s/p Emergent Laparotomy  Hx: Difficult intubation and Obesity  P:   Full vent support wean as tolerated Maintain O2 sats >92% Prn bronchodilator therapy Intermittent CXR Repeat ABG today  VAP Bundle   CARDIOVASCULAR A:  No acute issues Hx: HTN P:  Continuous telemetry monitoring Trend troponin's Maintain MAP >65 US Venous Img Upper Right pending rule out DVT   RENAL A:   Hypokalemia  Hypomagnesium  P:   Trend BMP Replace electrolytes as indicated Monitor UOP  GASTROINTESTINAL A:   S/P Exploratory Laparotomy with Evisceration of Bowel s/p Laparotomy and Appendectomy on 04/28 Hx: Blood in stool and Gastric ulcer  P:   Protonix for PUD prophylaxis  Surgery consulted appreciate input   Keep NPO will defer to surgery  Midline abdominal wound vac per surgery  IR consultation for attempt orogastric tube placement for abdominal decompression   HEMATOLOGIC A:   Anemia with acute blood loss s/p exploratory lap P:  Trend CBC  Monitor s/sx of bleeding Transfuse for hgb of <7 Lovenox for VTE prophylaxis   INFECTIOUS A:   Intraabdominal Infection s/p laparotomy and appendectomy on 04/28 Leukocytosis  P:   Trend WBC and monitor fever curve Trend PCT's and lactic acid Follow cultures Continue abx as listed above   ENDOCRINE A:   Hyperglycemia P:   CBG's q4hrs SSI   NEUROLOGIC A:   Mechanically Intubated P:   RASS goal: 0 to -1 Propofol and Fentanyl gtt to maintain RASS goal  Prn Fentanyl for pain management  WUA daily  FAMILY  - Updates: Pts wife and daughter updated regarding plan of care and all questions answered 11/11/2016  - Inter-disciplinary family meet or Palliative Care meeting due by: 11/18/2016  Marda Stalker, [place] Pager [phone] (please enter 7 digits) PCCM Consult Pager [phone] (please enter 7 digits)

## 2016-11-11 NOTE — Anesthesia Procedure Notes (Signed)
Procedure Name: Intubation Date/Time: 11/11/2016 7:32 AM Performed by: Jonna Clark Pre-anesthesia Checklist: Patient identified, Patient being monitored, Timeout performed, Emergency Drugs available and Suction available Patient Re-evaluated:Patient Re-evaluated prior to inductionOxygen Delivery Method: Circle system utilized Preoxygenation: Pre-oxygenation with 100% oxygen Intubation Type: IV induction, Rapid sequence and Cricoid Pressure applied Ventilation: Mask ventilation without difficulty Laryngoscope Size: Glidescope and 4 Grade View: Grade II Tube type: Oral Tube size: 7.5 mm Number of attempts: 3 Airway Equipment and Method: Stylet Placement Confirmation: ETT inserted through vocal cords under direct vision,  positive ETCO2 and breath sounds checked- equal and bilateral Secured at: 21 cm Tube secured with: Tape Dental Injury: Teeth and Oropharynx as per pre-operative assessment  Difficulty Due To: Difficulty was anticipated, Difficult Airway- due to limited oral opening, Difficult Airway- due to large tongue and Difficult Airway- due to anterior larynx Future Recommendations: Recommend- induction with short-acting agent, and alternative techniques readily available Comments: Pt already noted difficult airway. Used glidescope. Very anterior with large amount of excessive tissue in pharyngeal area

## 2016-11-11 NOTE — Progress Notes (Signed)
Seen and examined Comfortable sedated NGT placed by GI appreciate their assistance Good UO VSS off pressors  PE NAD Abd: wound vac in place serosanguinous drainage.  A/P sepsis from necrotizing infection abd wall creating evisceration.  OR tomorrow abd washout Continue A/Bs Albumin added x 1 since he seems dry from sepsis Appreciate critical care assistance

## 2016-11-11 NOTE — Op Note (Signed)
Patient to PACU holding with large evisceration of intestines.  Sterile saline and gauze and towels applied by Dr. Dahlia Byes.  Patient in severe pain.  Oxygen via Nasal cannula applied at 4L.  To OR via stretcher at [phone].

## 2016-11-11 NOTE — Care Management (Signed)
This patient was recently here received to ICU post-op on today's date. Discharged with wound VAC and home health through [facility]. Patient will need CM assessment prior to discharge. Currently intubated; plans for surgery in the future.[facility] aware that patient is here.

## 2016-11-11 NOTE — Anesthesia Post-op Follow-up Note (Cosign Needed)
Anesthesia QCDR form completed.        

## 2016-11-11 NOTE — Progress Notes (Signed)
Anticoagulation monitoring(Lovenox):  56yo  male ordered Lovenox 40 mg Q24h  Filed Weights   11/11/16 0303  Weight: (!) 310 lb (140.6 kg)   BMI 50.8   Lab Results  Component Value Date   CREATININE 1.20 11/11/2016   CREATININE 0.47 (L) 11/06/2016   CREATININE 0.66 11/03/2016   Estimated Creatinine Clearance: 91 mL/min (by C-G formula based on SCr of 1.2 mg/dL). Hemoglobin & Hematocrit     Component Value Date/Time   HGB 12.5 (L) 11/11/2016 0305   HCT 36.9 (L) 11/11/2016 0305   HCT 44.2 06/17/2016 1347     Per Protocol for Patient with estCrcl > 30 ml/min and BMI > 40, will transition to Lovenox 40 mg Q12h.

## 2016-11-11 NOTE — Progress Notes (Signed)
Pharmacy Consult for Electrolyte Monitoring Indication: Hypokalemia/hypomag  Allergies  Allergen Reactions  . Isosorbide     Headache, nausea, vomiting    Patient Measurements: Height: 6' (182.9 cm) Weight: (!) 310 lb (140.6 kg) IBW/kg (Calculated) : 77.6  Vital Signs: Temp: 99 F (37.2 C) (05/10 1200) Temp Source: Axillary (05/10 1600) BP: 155/98 (05/10 1600) Pulse Rate: 104 (05/10 1700) Intake/Output from previous day: No intake/output data recorded. Intake/Output from this shift: Total I/O In: 1400 [I.V.:1400] Out: 650 [Urine:550; Blood:100]  Labs:  Recent Labs  11/11/16 0305 11/11/16 1529  WBC 14.8* 38.6*  HGB 12.5* 13.0  HCT 36.9* 39.6*  PLT 440 452*  CREATININE 1.20 1.12  MG 1.6* 1.6*  ALBUMIN 2.5* 2.3*  PROT 6.3* 5.9*  AST 53* 41  ALT 37 33  ALKPHOS 96 85  BILITOT 1.2 1.1   Estimated Creatinine Clearance: 104.5 mL/min (by C-G formula based on SCr of 1.12 mg/dL).  Potassium (mmol/L)  Date Value  11/11/2016 3.3 (L)   Calcium (mg/dL)  Date Value  11/11/2016 7.7 (L)   Assessment: 56 y/o M with ex lap appendectomy 4/28 admitted to ICU due to evisceration. Patient is currently ventilated and sedated. Potassium ordered x 4 runs of 10 meq but only 2 administered. NP currently has KCl 10 mEq x 4 via central line and magnesium 2 g IV x 1 ordered. 5/10 1530 K = 3.3 (after KCl 20 mEq IV) 5/10 1530 mag = 1.6 (no mag administered yet)  Plan:  Continue current electrolyte plan per NP. Will f/u with am labs  Darrow Bussing, PharmD Pharmacy Resident 11/11/2016 5:36 PM

## 2016-11-11 NOTE — Progress Notes (Signed)
Pharmacy Consult for Electrolyte Monitoring Indication: Hypokalemia  Allergies  Allergen Reactions  . Isosorbide     Headache, nausea, vomiting    Patient Measurements: Height: 5\' 7"  (170.2 cm) Weight: (!) 310 lb (140.6 kg) IBW/kg (Calculated) : 66.1  Vital Signs: Temp Source: Axillary (05/10 1000) BP: 159/99 (05/10 1000) Pulse Rate: 115 (05/10 1000) Intake/Output from previous day: No intake/output data recorded. Intake/Output from this shift: Total I/O In: 1400 [I.V.:1400] Out: 650 [Urine:550; Blood:100]  Labs:  Recent Labs  11/11/16 0305  WBC 14.8*  HGB 12.5*  HCT 36.9*  PLT 440  CREATININE 1.20  MG 1.6*  ALBUMIN 2.5*  PROT 6.3*  AST 53*  ALT 37  ALKPHOS 96  BILITOT 1.2   Estimated Creatinine Clearance: 91 mL/min (by C-G formula based on SCr of 1.2 mg/dL).  Potassium (mmol/L)  Date Value  11/11/2016 2.7 (LL)   Calcium (mg/dL)  Date Value  11/11/2016 8.0 (L)   Assessment: 56 y/o M with ex lap appendectomy 4/28 admitted to ICU due to evisceration. Patient is currently ventilated and sedated. Potassium ordered x 4 runs of 10 meq but only 2 administered. Magnesium sulfate 2 g ordered but not administered yet. CMET pending.   Plan:  Will f/u CMET and replace as necessary.   Ulice Dash D 11/11/2016,3:58 PM

## 2016-11-11 NOTE — ED Notes (Signed)
Patient transported to CT 

## 2016-11-11 NOTE — Progress Notes (Signed)
Request by Dr Adonis Huguenin to place OG tube for decompression   Placed under direct visualization using a fiber optic laryngoscope . On aspiration gastric contents seen. Advanced till 70 cm mark   Obtain x ray to confirm position    Dr Jonathon Bellows  Gastroenterology/Hepatology Pager: [phone]

## 2016-11-11 NOTE — ED Notes (Signed)
Attempt to place NG tube, once in at 30 pt starts to scream in agony, NG tube taken out. Pt thrashing in bed, screaming. OR called, pt taken straight to OR per Pabon MD.

## 2016-11-12 ENCOUNTER — Inpatient Hospital Stay: Payer: 59 | Admitting: Certified Registered Nurse Anesthetist

## 2016-11-12 ENCOUNTER — Encounter: Payer: Self-pay | Admitting: Surgery

## 2016-11-12 ENCOUNTER — Encounter
Admission: EM | Disposition: A | Discharge status: Other Acute Inpatient Hospital | Payer: Self-pay | Source: Home / Self Care | Attending: Surgery

## 2016-11-12 DIAGNOSIS — J9601 Acute respiratory failure with hypoxia: Secondary | ICD-10-CM

## 2016-11-12 DIAGNOSIS — K439 Ventral hernia without obstruction or gangrene: Secondary | ICD-10-CM

## 2016-11-12 HISTORY — PX: LAPAROTOMY: SHX154

## 2016-11-12 LAB — SURGICAL PATHOLOGY

## 2016-11-12 LAB — CBC
HCT: 31.5 % — ABNORMAL LOW (ref 40.0–52.0)
HEMOGLOBIN: 10.8 g/dL — AB (ref 13.0–18.0)
MCH: 29.7 pg (ref 26.0–34.0)
MCHC: 34.3 g/dL (ref 32.0–36.0)
MCV: 86.6 fL (ref 80.0–100.0)
Platelets: 427 10*3/uL (ref 150–440)
RBC: 3.64 MIL/uL — ABNORMAL LOW (ref 4.40–5.90)
RDW: 13.4 % (ref 11.5–14.5)
WBC: 21.2 10*3/uL — ABNORMAL HIGH (ref 3.8–10.6)

## 2016-11-12 LAB — COMPREHENSIVE METABOLIC PANEL
ALBUMIN: 2.2 g/dL — AB (ref 3.5–5.0)
ALK PHOS: 64 U/L (ref 38–126)
ALT: 24 U/L (ref 17–63)
ANION GAP: 7 (ref 5–15)
AST: 32 U/L (ref 15–41)
BUN: 19 mg/dL (ref 6–20)
CALCIUM: 7.3 mg/dL — AB (ref 8.9–10.3)
CO2: 30 mmol/L (ref 22–32)
Chloride: 99 mmol/L — ABNORMAL LOW (ref 101–111)
Creatinine, Ser: 1.68 mg/dL — ABNORMAL HIGH (ref 0.61–1.24)
GFR calc Af Amer: 50 mL/min — ABNORMAL LOW (ref >60)
GFR calc non Af Amer: 43 mL/min — ABNORMAL LOW (ref >60)
GLUCOSE: 137 mg/dL — AB (ref 65–99)
POTASSIUM: 3.7 mmol/L (ref 3.5–5.1)
SODIUM: 136 mmol/L (ref 135–145)
Total Bilirubin: 0.8 mg/dL (ref 0.3–1.2)
Total Protein: 5.3 g/dL — ABNORMAL LOW (ref 6.5–8.1)

## 2016-11-12 LAB — GLUCOSE, CAPILLARY
GLUCOSE-CAPILLARY: 136 mg/dL — AB (ref 65–99)
GLUCOSE-CAPILLARY: 113 mg/dL — AB (ref 65–99)
GLUCOSE-CAPILLARY: 100 mg/dL — AB (ref 65–99)
Glucose-Capillary: 101 mg/dL — ABNORMAL HIGH (ref 65–99)
Glucose-Capillary: 104 mg/dL — ABNORMAL HIGH (ref 65–99)
Glucose-Capillary: 98 mg/dL (ref 65–99)

## 2016-11-12 LAB — MAGNESIUM: Magnesium: 2 mg/dL (ref 1.7–2.4)

## 2016-11-12 LAB — PHOSPHORUS: Phosphorus: 6.4 mg/dL — ABNORMAL HIGH (ref 2.5–4.6)

## 2016-11-12 LAB — PROCALCITONIN: PROCALCITONIN: 0.8 ng/mL

## 2016-11-12 SURGERY — LAPAROTOMY, EXPLORATORY
Anesthesia: General

## 2016-11-12 MED ORDER — SODIUM CHLORIDE 0.9 % IV BOLUS (SEPSIS)
500.0000 mL | Freq: Once | INTRAVENOUS | Status: AC
  Administered 2016-11-12: 500 mL via INTRAVENOUS

## 2016-11-12 MED ORDER — FENTANYL CITRATE (PF) 100 MCG/2ML IJ SOLN
INTRAMUSCULAR | Status: DC | PRN
  Administered 2016-11-12 (×2): 50 ug via INTRAVENOUS
  Administered 2016-11-12: 100 ug via INTRAVENOUS

## 2016-11-12 MED ORDER — CHLORHEXIDINE GLUCONATE 0.12% ORAL RINSE (MEDLINE KIT)
15.0000 mL | Freq: Two times a day (BID) | OROMUCOSAL | Status: DC
  Administered 2016-11-12 – 2016-11-23 (×23): 15 mL via OROMUCOSAL

## 2016-11-12 MED ORDER — ORAL CARE MOUTH RINSE
15.0000 mL | Freq: Four times a day (QID) | OROMUCOSAL | Status: DC
  Administered 2016-11-12 – 2016-11-20 (×29): 15 mL via OROMUCOSAL

## 2016-11-12 MED ORDER — FENTANYL CITRATE (PF) 100 MCG/2ML IJ SOLN
INTRAMUSCULAR | Status: AC
  Filled 2016-11-12: qty 2

## 2016-11-12 MED ORDER — MIDAZOLAM HCL 2 MG/2ML IJ SOLN
2.0000 mg | INTRAMUSCULAR | Status: AC
  Administered 2016-11-12: 2 mg via INTRAVENOUS

## 2016-11-12 MED ORDER — MIDAZOLAM HCL 2 MG/2ML IJ SOLN
2.0000 mg | INTRAMUSCULAR | Status: DC | PRN
  Administered 2016-11-12 (×3): 2 mg via INTRAVENOUS
  Administered 2016-11-13: 4 mg via INTRAVENOUS
  Administered 2016-11-13 (×2): 2 mg via INTRAVENOUS
  Administered 2016-11-14: 4 mg via INTRAVENOUS
  Administered 2016-11-15 – 2016-11-22 (×3): 2 mg via INTRAVENOUS
  Administered 2016-11-23: 4 mg via INTRAVENOUS
  Administered 2016-11-23: 2 mg via INTRAVENOUS
  Administered 2016-11-23: 4 mg via INTRAVENOUS
  Administered 2016-11-23: 2 mg via INTRAVENOUS
  Filled 2016-11-12 (×4): qty 4
  Filled 2016-11-12: qty 2
  Filled 2016-11-12 (×3): qty 4
  Filled 2016-11-12: qty 2
  Filled 2016-11-12 (×2): qty 4
  Filled 2016-11-12: qty 2
  Filled 2016-11-12: qty 4
  Filled 2016-11-12 (×2): qty 2

## 2016-11-12 MED ORDER — ONDANSETRON HCL 4 MG/2ML IJ SOLN
INTRAMUSCULAR | Status: DC | PRN
  Administered 2016-11-12: 4 mg via INTRAVENOUS

## 2016-11-12 MED ORDER — FENTANYL CITRATE (PF) 100 MCG/2ML IJ SOLN: 25.0000 ug | INTRAMUSCULAR | Status: DC | PRN

## 2016-11-12 MED ORDER — MIDAZOLAM HCL 2 MG/2ML IJ SOLN
INTRAMUSCULAR | Status: DC | PRN
  Administered 2016-11-12: 2 mg via INTRAVENOUS

## 2016-11-12 MED ORDER — PROPOFOL 10 MG/ML IV BOLUS
INTRAVENOUS | Status: AC
  Filled 2016-11-12: qty 20

## 2016-11-12 MED ORDER — ONDANSETRON HCL 4 MG/2ML IJ SOLN: 4.0000 mg | Freq: Once | INTRAMUSCULAR | Status: DC | PRN

## 2016-11-12 MED ORDER — SODIUM CHLORIDE 0.9 % IV BOLUS (SEPSIS)
1000.0000 mL | Freq: Once | INTRAVENOUS | Status: AC
  Administered 2016-11-12: 1000 mL via INTRAVENOUS

## 2016-11-12 MED ORDER — ROCURONIUM BROMIDE 100 MG/10ML IV SOLN
INTRAVENOUS | Status: DC | PRN
  Administered 2016-11-12: 20 mg via INTRAVENOUS
  Administered 2016-11-12: 30 mg via INTRAVENOUS
  Administered 2016-11-12: 50 mg via INTRAVENOUS
  Administered 2016-11-12: 20 mg via INTRAVENOUS

## 2016-11-12 MED ORDER — PHENYLEPHRINE HCL 10 MG/ML IJ SOLN
INTRAMUSCULAR | Status: DC | PRN
  Administered 2016-11-12: 200 ug via INTRAVENOUS
  Administered 2016-11-12 (×2): 100 ug via INTRAVENOUS

## 2016-11-12 MED ORDER — ONDANSETRON HCL 4 MG/2ML IJ SOLN
INTRAMUSCULAR | Status: AC
  Filled 2016-11-12: qty 2

## 2016-11-12 MED ORDER — FENTANYL CITRATE (PF) 100 MCG/2ML IJ SOLN
25.0000 ug | INTRAMUSCULAR | Status: DC | PRN
  Administered 2016-11-13 (×2): 25 ug via INTRAVENOUS

## 2016-11-12 MED ORDER — MIDAZOLAM HCL 2 MG/2ML IJ SOLN
INTRAMUSCULAR | Status: AC
  Filled 2016-11-12: qty 2

## 2016-11-12 MED ORDER — PROPOFOL 10 MG/ML IV BOLUS
INTRAVENOUS | Status: DC | PRN
  Administered 2016-11-12: 80 mg via INTRAVENOUS

## 2016-11-12 MED ORDER — ROCURONIUM BROMIDE 100 MG/10ML IV SOLN
INTRAVENOUS | Status: AC
  Filled 2016-11-12: qty 1

## 2016-11-12 SURGICAL SUPPLY — 35 items
BULB RESERV EVAC DRAIN JP 100C (MISCELLANEOUS) IMPLANT
CANISTER SUCT 1200ML W/VALVE (MISCELLANEOUS) ×2 IMPLANT
CATH TRAY 16F METER LATEX (MISCELLANEOUS) IMPLANT
CHLORAPREP W/TINT 26ML (MISCELLANEOUS) IMPLANT
DRAIN CHANNEL JP 15F RND 16 (MISCELLANEOUS) IMPLANT
DRAPE LAPAROTOMY 100X77 ABD (DRAPES) ×2 IMPLANT
DRSG OPSITE POSTOP 4X8 (GAUZE/BANDAGES/DRESSINGS) IMPLANT
ELECT CAUTERY BLADE 6.4 (BLADE) ×2 IMPLANT
ELECT REM PT RETURN 9FT ADLT (ELECTROSURGICAL) ×2
ELECTRODE REM PT RTRN 9FT ADLT (ELECTROSURGICAL) ×1 IMPLANT
GAUZE SPONGE 4X4 12PLY STRL (GAUZE/BANDAGES/DRESSINGS) IMPLANT
GLOVE BIO SURGEON STRL SZ7.5 (GLOVE) ×4 IMPLANT
GLOVE INDICATOR 8.0 STRL GRN (GLOVE) ×2 IMPLANT
GOWN STRL REUS W/ TWL LRG LVL3 (GOWN DISPOSABLE) ×2 IMPLANT
GOWN STRL REUS W/TWL LRG LVL3 (GOWN DISPOSABLE) ×2
KIT RM TURNOVER STRD PROC AR (KITS) ×2 IMPLANT
LABEL OR SOLS (LABEL) IMPLANT
LIGASURE IMPACT 36 18CM CVD LR (INSTRUMENTS) IMPLANT
NS IRRIG 1000ML POUR BTL (IV SOLUTION) ×2 IMPLANT
PACK BASIN MAJOR ARMC (MISCELLANEOUS) IMPLANT
PACK COLON CLEAN CLOSURE (MISCELLANEOUS) IMPLANT
SPONGE ABDOMINAL VAC ABTHERA (MISCELLANEOUS) ×2 IMPLANT
SPONGE LAP 18X18 5 PK (GAUZE/BANDAGES/DRESSINGS) ×2 IMPLANT
STAPLER SKIN PROX 35W (STAPLE) IMPLANT
SUT PDS AB 1 TP1 96 (SUTURE) IMPLANT
SUT SILK 2 0 SH (SUTURE) IMPLANT
SUT SILK 2 0SH CR/8 30 (SUTURE) ×2 IMPLANT
SUT SILK 3 0 (SUTURE)
SUT SILK 3-0 (SUTURE)
SUT SILK 3-0 18XBRD TIE 12 (SUTURE) IMPLANT
SUT SILK 3-0 SH-1 18XCR BRD (SUTURE)
SUT VIC AB 3-0 SH 27 (SUTURE)
SUT VIC AB 3-0 SH 27X BRD (SUTURE) IMPLANT
SUTURE SILK 3-0 SH-1 18XCR BRD (SUTURE) IMPLANT
WND VAC CANISTER 500ML (MISCELLANEOUS) ×2 IMPLANT

## 2016-11-12 NOTE — Progress Notes (Signed)
Pt ventilator found on rate of 24. Pt had previously been on rate of 14. Documented in flowsheet. Will continue to monitor pt.

## 2016-11-12 NOTE — Anesthesia Postprocedure Evaluation (Signed)
Anesthesia Post Note  Patient: Peter [name]  Procedure(s) Performed: Procedure(s) (LRB): EXPLORATORY LAPAROTOMY (N/A)  Patient location during evaluation: PACU Anesthesia Type: General Level of consciousness: awake and alert Pain management: pain level controlled Vital Signs Assessment: post-procedure vital signs reviewed and stable Respiratory status: spontaneous breathing, nonlabored ventilation, respiratory function stable and patient connected to nasal cannula oxygen Cardiovascular status: blood pressure returned to baseline and stable Postop Assessment: no signs of nausea or vomiting Anesthetic complications: no     Last Vitals:  Vitals:   11/12/16 1500 11/12/16 1832  BP: (!) 114/59 (!) 116/50  Pulse: 91 91  Resp: 15 (!) 22  Temp:  36.7 C    Last Pain:  Vitals:   11/12/16 1832  TempSrc: Oral  PainSc:                  [name]

## 2016-11-12 NOTE — Transfer of Care (Signed)
Immediate Anesthesia Transfer of Care Note  Patient: Peter [name]  Procedure(s) Performed: Procedure(s): EXPLORATORY LAPAROTOMY (N/A)  Patient Location: PACU  Anesthesia Type:General  Level of Consciousness: sedated  Airway & Oxygen Therapy: Patient placed on Ventilator (see vital sign flow sheet for setting)  Post-op Assessment: Report given to RN and Post -op Vital signs reviewed and stable  Post vital signs: Reviewed and stable  Last Vitals:  Vitals:   11/12/16 1400 11/12/16 1500  BP: (!) 107/55 (!) 114/59  Pulse: 84 91  Resp: 14 15  Temp:      Last Pain:  Vitals:   11/12/16 1200  TempSrc: Axillary  PainSc:          Complications: No apparent anesthesia complications

## 2016-11-12 NOTE — Plan of Care (Signed)
Problem: Fluid Volume: Goal: Ability to maintain a balanced intake and output will improve Outcome: Not Met (add Reason) Pt continues to be intubated and sedated overnight. Pt also has wound vac in place with adequate output. [name] UO began to decrease overnight and NP was made aware. Pt given 500cc bolus with no increase in UO.

## 2016-11-12 NOTE — Op Note (Signed)
Pre-operative Diagnosis: Open abdomen  Post-operative Diagnosis: Same  Procedure performed: Exploratory laparotomy with lysis of adhesions  Surgeon: Clayburn Pert   Assistants: None  Anesthesia: General endotracheal anesthesia  ASA Class: 3  Surgeon: Clayburn Pert, MD FACS  Anesthesia: Gen. with endotracheal tube  Assistant:None  Procedure Details  The patient was seen again in the ICU. The benefits, complications, treatment options, and expected outcomes were discussed with the patient's wife. The risks of bleeding, infection, recurrence of symptoms, failure to resolve symptoms,  bowel injury, any of which could require further surgery were reviewed with the patient.   The patient was taken to Operating Room, identified as Peter [name] and the procedure verified.  A Time Out was held and the above information confirmed.  Patient was brought to the operating room from the ICU intubated and under general anesthesia, antibiotic prophylaxis was confirmed. VTE prophylaxis was in place. The previously placed wound VAC was removed and the abdomen was prepped with iodine and draped in the sterile fashion. The patient was positioned in the supine position.  The abdomen was fully inspected. There were extensive adhesions again noted today. The flimsy adhesions to the bowel were bluntly dissected. The previously concerning loop of bowel that was covered in fibrinous exudate was again evaluated. There is no obvious perforation. Continue to be extensive adhesions to the right side of the abdomen going towards the cecum. Extensive sharp dissection and electrocautery dissection was used to free up adhesions towards the cecum. 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 There is an area of bleeding noted on the mesentery and small bowel that required suture ligation. The remainder were controlled with  electrocautery.  The entire small bowel was again run. There was no obvious areas of perforation or bowel leakage. Attention was then turned to the fascia and superficial tissues. A small area of necrotic tissue was noted to the patient's right that was removed with electrocautery. The extensive area of dissection that was done yesterday on the left was found to be without obvious necrosis and was something washed out.  3 L of warm irrigation with interrupted the field and all quadrants were closely irrigated until the irrigation returned clear. After this the decision was made to attempt to reapproximate the abdomen again with the appendectomy there are wound VAC system. The VAC was cut to the appropriate size and placed into the abdominal cavity underneath the fascia on top of the bowel. The football was placed and the outer layer was removed. The outer layer was then placed into the large cavity to the patient's left.   The skin was then cleaned off with a wet and a dry lap sponge. Using benzoin as an adhesive the provided adhesive material was placed circumference around the abdomen reapproximate the skin closer to the midline. Once this was in place circumferentially it was cut in the center for the [place] and a good seal was obtained. It was then transferred to the wound VAC device.  The patient tolerated the procedure well. All counts were correct at the end of the procedure. There were no immediate Complications. He was transferred back to the ICU intubated and sedated.  Findings: Extensive adhesions, minimal necrotic soft tissue   Estimated Blood Loss: 300 mL         Drains: None         Specimens: None  Complications: None                  Condition: Guarded   Clayburn Pert, MD, FACS

## 2016-11-12 NOTE — Brief Op Note (Signed)
11/11/2016 - 11/12/2016  5:40 PM  PATIENT:  Peter [name]  56 y.o. 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   Delay start of Pharmacological VTE agent (>24hrs) due to surgical blood loss or risk of bleeding: no

## 2016-11-12 NOTE — Progress Notes (Signed)
1 Day Post-Op   Subjective:  Patient remains in ICU intubated, sedated, on a ventilator. Required initiation of vasopressors earlier today. He is otherwise resting comfortably.  Vital signs in last 24 hours: Temp:  [97.8 F (36.6 C)-98.2 F (36.8 C)] 97.8 F (36.6 C) (05/11 0900) Pulse Rate:  [83-109] 83 (05/11 0900) Resp:  [13-17] 15 (05/11 0900) BP: (79-155)/(47-98) 79/47 (05/11 0900) SpO2:  [95 %-99 %] 99 % (05/11 0900) FiO2 (%):  [40 %] 40 % (05/11 1116) Last BM Date:  (UTA)  Intake/Output from previous day: 05/10 0701 - 05/11 0700 In: 6875.2 [P.O.:1; I.V.:6324.2; IV Piggyback:550] Out: 2021 [Urine:971; Drains:950; Blood:100]  GI: Abdomen is very large, nondistended. Wound VAC in place with serosanguineous output.  Lab Results:  CBC  Recent Labs  11/11/16 1529 11/12/16 0431  WBC 38.6* 21.2*  HGB 13.0 10.8*  HCT 39.6* 31.5*  PLT 452* 427   CMP     Component Value Date/Time   NA 136 11/12/2016 0431   NA 147 (H) 06/17/2016 1329   K 3.7 11/12/2016 0431   CL 99 (L) 11/12/2016 0431   CO2 30 11/12/2016 0431   GLUCOSE 137 (H) 11/12/2016 0431   BUN 19 11/12/2016 0431   BUN 21 06/17/2016 1329   CREATININE 1.68 (H) 11/12/2016 0431   CALCIUM 7.3 (L) 11/12/2016 0431   PROT 5.3 (L) 11/12/2016 0431   PROT 7.3 05/04/2016 1021   ALBUMIN 2.2 (L) 11/12/2016 0431   ALBUMIN 4.1 05/04/2016 1021   AST 32 11/12/2016 0431   ALT 24 11/12/2016 0431   ALKPHOS 64 11/12/2016 0431   BILITOT 0.8 11/12/2016 0431   BILITOT 0.5 05/04/2016 1021   GFRNONAA 43 (L) 11/12/2016 0431   GFRAA 50 (L) 11/12/2016 0431   PT/INR No results for input(s): LABPROT, INR in the last 72 hours.  Studies/Results: Dg Abd 1 View  Result Date: 11/11/2016 CLINICAL DATA:  Evaluate OG tube placement. EXAM: ABDOMEN - 1 VIEW COMPARISON:  None. FINDINGS: The OG tube terminates within the stomach. IMPRESSION: The OG tube terminates in the stomach. Electronically Signed   By: Dorise Bullion III M.D   On:  11/11/2016 17:26   Ct Abdomen Pelvis W Contrast  Result Date: 11/11/2016 CLINICAL DATA:  56 y/o M; right flank and abdominal pain. Appendectomy 10 days ago. EXAM: CT ABDOMEN AND PELVIS WITH CONTRAST TECHNIQUE: Multidetector CT imaging of the abdomen and pelvis was performed using the standard protocol following bolus administration of intravenous contrast. CONTRAST:  100 cc Isovue 3 7 COMPARISON:  None. FINDINGS: Lower chest: Stable 4-5 mm nodules along the right major fissure (series 4, image 7 and 5). Hepatobiliary: No focal liver abnormality is seen. Status post cholecystectomy. No biliary dilatation. Pancreas: Unremarkable. No pancreatic ductal dilatation or surrounding inflammatory changes. Spleen: Normal in size without focal abnormality. Adrenals/Urinary Tract: Left adrenal scarring probably representing sequelae of prior infection or hemorrhage. Nonspecific 13 mm hypodense focus within the left adrenal gland measures 8 Hounsfield units on the coronal reconstruction (series 5, image 65) and may represent an adenoma or possibly scarring. Stable left kidney parapelvic cysts. No hydronephrosis or obstructive uropathy. Normal bladder. Stomach/Bowel: Open ventral lower abdominal wall an broad-based left lower quadrant herniation in the abdominal wall measuring approximately 13 x 15 cm (ML by CC). Through the herniation extending into the open wound I loops of small and large bowel which demonstrate wall thickening and surrounding inflammatory changes. There is proximal mild small bowel obstruction. Additionally, there is fluid and small foci of  air within the deep portion of the abdominal wound near the pubic symphysis without Organization into a discrete abscess. In the right lower quadrant appendectomy bed there is stranding of fat without a discrete fluid collection which may represent phlegmonous or postsurgical changes. Vascular/Lymphatic: No significant vascular findings are present. No enlarged  abdominal or pelvic lymph nodes. Reproductive: Central prostatic calcification. Other: Small volume of peritoneal ascites. Musculoskeletal: No fracture is seen. IMPRESSION: 1. Open ventral lower abdominal wall wound and broad-based left lower quadrant abdominal wall hernia at the deep margin of the open wound measuring approximately 13 x 15 cm (ML by CC). Through the herniation extending into the open wound are loops of small and large bowel which demonstrate wall thickening and surrounding inflammatory changes. There is proximal mild small bowel obstruction. 2. Fat stranding in the right lower quadrant appendectomy bed may represent phlegmonous or postsurgical changes. No peritoneal abscess identified. 3. Left adrenal hypodense nodule measures 8 Hounsfield units, probably an adenoma or scarring. 4. 4-5 mm pulmonary nodules along right major fissure. No follow-up needed if patient is low-risk (and has no known or suspected primary neoplasm). Non-contrast chest CT can be considered in 12 months if patient is high-risk. This recommendation follows the consensus statement: Guidelines for Management of Incidental Pulmonary Nodules Detected on CT Images: From the Fleischner Society 2017; Radiology 2017; 284:228-243. Electronically Signed   By: Kristine Garbe M.D.   On: 11/11/2016 06:05   US Venous Img Upper Uni Right  Result Date: 11/11/2016 CLINICAL DATA:  Right arm edema. EXAM: RIGHT UPPER EXTREMITY VENOUS DOPPLER ULTRASOUND TECHNIQUE: Gray-scale sonography with graded compression, as well as color Doppler and duplex ultrasound were performed to evaluate the upper extremity deep venous system from the level of the subclavian vein and including the jugular, axillary, basilic, radial, ulnar and upper cephalic vein. Spectral Doppler was utilized to evaluate flow at rest and with distal augmentation maneuvers. COMPARISON:  None. FINDINGS: Contralateral Subclavian Vein: Respiratory phasicity is normal and  symmetric with the symptomatic side. No evidence of thrombus. Normal compressibility. Internal Jugular Vein: No evidence of thrombus. Normal compressibility, respiratory phasicity and response to augmentation. Subclavian Vein: No evidence of thrombus. Normal compressibility, respiratory phasicity and response to augmentation. Axillary Vein: No evidence of thrombus. Normal compressibility, respiratory phasicity and response to augmentation. Cephalic Vein: Nonocclusive thrombus is identified in the cephalic vein above the antecubital fossa. Basilic Vein: No evidence of thrombus. Normal compressibility, respiratory phasicity and response to augmentation. Brachial Veins: No evidence of thrombus. Normal compressibility, respiratory phasicity and response to augmentation. Radial Veins: No evidence of thrombus. Normal compressibility, respiratory phasicity and response to augmentation. Ulnar Veins: No evidence of thrombus. Normal compressibility, respiratory phasicity and response to augmentation. Venous Reflux:  None visualized. Other Findings:  No abnormal fluid collections. IMPRESSION: No evidence of DVT within the right upper extremity. There is evidence of superficial thrombophlebitis with nonocclusive thrombus identified within a segment of the cephalic vein above the antecubital fossa. Electronically Signed   By: Aletta Edouard M.D.   On: 11/11/2016 17:17   Dg Chest Port 1 View  Result Date: 11/11/2016 CLINICAL DATA:  Endotracheal tube placement and central line placement. EXAM: PORTABLE CHEST 1 VIEW COMPARISON:  Radiographs of November 01, 2016. FINDINGS: Stable cardiomegaly. Hypoinflation of the lungs is noted. Stable bibasilar subsegmental atelectasis. Endotracheal tube is seen projected over tracheal air shadow with distal tip 4 cm above the carina. Left internal jugular catheter is noted with distal tip in expected position of left brachycephalic  vein. No pneumothorax or pleural effusion is noted. Bony thorax  is unremarkable. IMPRESSION: Endotracheal tube and left internal jugular catheter in grossly good position. Stable bibasilar subsegmental atelectasis. Hypoinflation of the lungs is noted. Electronically Signed   By: Marijo Conception, M.D.   On: 11/11/2016 12:33    Assessment/Plan: 56 year old male with open abdomen in the ICU ventilated and sedated. Appreciate critical care assistance with management of this complicated patient. Appreciate GI assisted with placement of NG tube. Discussed with critical care and the patient's family the plan of returning to the operating room today for additional washout and second look. Discussed that depending on the operative findings would determine the course undertaken. Should additional necrotic material be identified it will need to be reviewed debrided today. Should no additional debridement or resection be required today we would then start the process of trying to close his abdomen. The timing of returning to the operating room will be dictated by the operative findings today. All questions answered to the patient's family's satisfaction. Patient will remain intubated and sedated until history the operating room appeared to be completed.   Clayburn Pert, MD [facility]  Day [facility] [phone] Night [facility] [phone]  11/12/2016

## 2016-11-12 NOTE — Progress Notes (Signed)
Pharmacy Consult for Electrolyte Monitoring Indication: Hypokalemia/hypomag  Allergies  Allergen Reactions  . Isosorbide     Headache, nausea, vomiting    Patient Measurements: Height: 6' (182.9 cm) Weight: (!) 312 lb 9.8 oz (141.8 kg) IBW/kg (Calculated) : 77.6  Vital Signs: Temp: 97.9 F (36.6 C) (05/11 1200) Temp Source: Axillary (05/11 1200) BP: 101/58 (05/11 1300) Pulse Rate: 80 (05/11 1300) Intake/Output from previous day: 05/10 0701 - 05/11 0700 In: 6875.2 [P.O.:1; I.V.:6324.2; IV Piggyback:550] Out: 2021 [Urine:971; Drains:950; Blood:100] Intake/Output from this shift: Total I/O In: 484.7 [I.V.:484.7] Out: 628 [Urine:178; Drains:450]  Labs:  Recent Labs  11/11/16 0305 11/11/16 1529 11/12/16 0431  WBC 14.8* 38.6* 21.2*  HGB 12.5* 13.0 10.8*  HCT 36.9* 39.6* 31.5*  PLT 440 452* 427  CREATININE 1.20 1.12 1.68*  MG 1.6* 1.6* 2.0  PHOS  --   --  6.4*  ALBUMIN 2.5* 2.3* 2.2*  PROT 6.3* 5.9* 5.3*  AST 53* 41 32  ALT 37 33 24  ALKPHOS 96 85 64  BILITOT 1.2 1.1 0.8   Estimated Creatinine Clearance: 70 mL/min (A) (by C-G formula based on SCr of 1.68 mg/dL (H)).  Potassium (mmol/L)  Date Value  11/12/2016 3.7   Phosphorus (mg/dL)  Date Value  11/12/2016 6.4 (H)   Calcium (mg/dL)  Date Value  11/12/2016 7.3 (L)   Corrected Ca= 8.7 mg/dl  Assessment: 56 y/o M with ex lap appendectomy 4/28 admitted to ICU due to evisceration. Patient is currently ventilated and sedated.   Plan:  Electrolytes are WNL. Will f/u AM labs.   Ulice Dash, PharmD Clinical Pharmacist  11/12/2016 1:38 PM

## 2016-11-12 NOTE — Anesthesia Post-op Follow-up Note (Cosign Needed)
Anesthesia QCDR form completed.        

## 2016-11-12 NOTE — Progress Notes (Signed)
PULMONARY / CRITICAL CARE MEDICINE   Name: Peter [name] MRN: [id number] DOB: 07-24-1960    ADMISSION DATE:  11/11/2016 CONSULTATION DATE:  11/11/2016  REFERRING MD:  Dr. Dahlia Byes   CHIEF COMPLAINT: S/P emergent exploratory laparotomy, debridement of abdominal wound, and abdominal wash out  HISTORY OF PRESENT ILLNESS:   This is a 56 yo male with PMH of Gastric Ulcer, Obesity, HTN, Glaucoma, Difficult Intubation, and Blood in Stool.  He presented to [facility] ER 05/10 with 10/10 sharp right sided flank pain radiating into the abdomen onset this am 05/10 at 1 am with evisceration of bowel.  He is s/p open appendectomy on 04/28 he was discharged from [facility] with a midline abdominal wound vac due to high risk of wound infection.  Surgical team consulted 05/10 and pt taken emergently to OR for laparotomy due to soft tissue infection of the abdominal wall at previous surgical site causing evisceration with necrotizing infection to the left side of the abdominal wall.  During laparotomy abdominal wall debridement of skin, subq, muscle, and facia was performed and abthera abdominal wound vac placed.  Pt remained mechanically intubated and admitted to ICU for further workup and management  PAST MEDICAL HISTORY :  He  has a past medical history of Blood in stool; Difficult intubation; Glaucoma; Hypertension; Obesity, unspecified; Ulcer (2011); and Unspecified hemorrhoids without mention of complication.  PAST SURGICAL HISTORY: He  has a past surgical history that includes Knee surgery (Left, 2010); Cholecystectomy (1992); Colonoscopy w/ polypectomy (05/22/2010); Cardiac catheterization (Left, 06/23/2016); and laparoscopic appendectomy (N/A, 10/30/2016).  Allergies  Allergen Reactions  . Isosorbide     Headache, nausea, vomiting    No current facility-administered medications on file prior to encounter.    Current Outpatient Prescriptions on File Prior to Encounter  Medication Sig  . aspirin EC 81 MG  tablet Take 81 mg by mouth daily.  Marland Kitchen atorvastatin (LIPITOR) 40 MG tablet Take 1 tablet (40 mg total) by mouth daily. (Patient taking differently: Take 40 mg by mouth every evening. )  . EQUETRO 200 MG CP12 12 hr capsule Take 400-600 mg by mouth 2 (two) times daily. Take 400 mg in the morning & 600 mg in the evening.  . hydrALAZINE (APRESOLINE) 25 MG tablet TAKE 1 TABLET(25 MG) BY MOUTH THREE TIMES DAILY  . ibuprofen (ADVIL,MOTRIN) 200 MG tablet Take 800 mg by mouth 3 (three) times daily as needed for headache or moderate pain.  Marland Kitchen lamoTRIgine (LAMICTAL) 200 MG tablet Take 400 mg by mouth at bedtime.   Marland Kitchen losartan (COZAAR) 100 MG tablet TAKE 1 TABLET BY MOUTH EVERY DAY  . LUMIGAN 0.01 % SOLN Place 1 drop into both eyes at bedtime.   Marland Kitchen MAGNESIUM-OXIDE 400 (241.3 Mg) MG tablet Take 400 mg by mouth 2 (two) times daily.  . Nebivolol HCl 20 MG TABS Take 1 tablet (20 mg total) by mouth every evening.  Marland Kitchen OMEGA-3 FATTY ACIDS PO Take 1 capsule by mouth 2 (two) times daily.   Marland Kitchen oxyCODONE (OXY IR/ROXICODONE) 5 MG immediate release tablet Take 1-2 tablets (5-10 mg total) by mouth every 4 (four) hours as needed for severe pain.  . ranitidine (ZANTAC) 300 MG tablet TAKE 1 TABLET(300 MG) BY MOUTH TWICE DAILY  . RUTIN, MISC. NUTRITIONAL FACTORS, TAKE 1 TABLET (500 MG) BY MOUTH DAILY  . sertraline (ZOLOFT) 100 MG tablet TAKE 1 TABLET BY MOUTH TWICE DAILY (Patient taking differently: TAKE 2 TABLETS (200 MG) BY MOUTH DAILY)  . vitamin C (ASCORBIC ACID) 500  MG tablet Take 500 mg by mouth every evening.   . Vitamin D, Ergocalciferol, (DRISDOL) 50000 units CAPS capsule Take 1 capsule (50,000 Units total) by mouth every 7 (seven) days.  Marland Kitchen amLODipine (NORVASC) 10 MG tablet TAKE 1 TABLET BY MOUTH EVERY DAY (Patient not taking: Reported on 11/11/2016)  . clobetasol cream (TEMOVATE) 1.61 % Apply 1 application topically at bedtime as needed.  . fluticasone (FLONASE) 50 MCG/ACT nasal spray Place 2 sprays into both nostrils daily.  (Patient taking differently: Place 2 sprays into both nostrils daily as needed for allergies. )  . Garlic 096 MG TABS Take 100 mg by mouth 2 (two) times daily.   . potassium chloride SA (K-DUR,KLOR-CON) 20 MEQ tablet Take 2 tablets (40 mEq total) by mouth 3 (three) times daily.  Marland Kitchen spironolactone (ALDACTONE) 50 MG tablet Take 1 tablet (50 mg total) by mouth daily. (Patient not taking: Reported on 09/21/2016)    FAMILY HISTORY:  His indicated that his mother is deceased. He indicated that his father is deceased. He indicated that both of his sisters are alive. He indicated that his other is deceased.    SOCIAL HISTORY: He  reports that he has never smoked. He has never used smokeless tobacco. He reports that he drinks alcohol. He reports that he does not use drugs.  REVIEW OF SYSTEMS:   Unable to assess pt intubated   SUBJECTIVE:  Patient remains critically ill, sedated, intubated and on mechanical ventilation with plans to go back to the OR on 5/11.  VITAL SIGNS: BP (!) 95/53 (BP Location: Right Arm)   Pulse 88   Temp 97.9 F (36.6 C) (Axillary)   Resp 15   Ht 6' (1.829 m)   Wt (!) 312 lb 9.8 oz (141.8 kg)   SpO2 98%   BMI 42.40 kg/m   HEMODYNAMICS: CVP:  [7 mmHg-16 mmHg] 16 mmHg  VENTILATOR SETTINGS: Vent Mode: PRVC FiO2 (%):  [40 %] 40 % Set Rate:  [15 bmp] 15 bmp Vt Set:  [500 mL] 500 mL PEEP:  [5 cmH20] 5 cmH20  INTAKE / OUTPUT: I/O last 3 completed shifts: In: 1892.1 [I.V.:1842.1; IV Piggyback:50] Out: 1100 [Urine:550; Drains:450; Blood:100]  PHYSICAL EXAMINATION: General: obese Caucasian critically ill male,intubated and on mechanical ventillation Neuro: sedated, PERRL HEENT: AT,[place], No jvd, PERRLA Cardiovascular: S1S2, Regular, no M/R/G Lung: diminished bibasilar, no wheezes, crackles, rhonchi  Abdomen: no audible BS, midline abdominal wound vac in place, obese Musculoskeletal: normal bulk and tone, no edema Skin: large midline abdominal wound vac in place  dry and intact  LABS:  BMET  Recent Labs Lab 11/06/16 0533 11/11/16 0305 11/11/16 1529  NA  --  138 135  K  --  2.7* 3.3*  CL  --  100* 99*  CO2  --  24 26  BUN  --  16 14  CREATININE 0.47* 1.20 1.12  GLUCOSE  --  131* 212*    Electrolytes  Recent Labs Lab 11/11/16 0305 11/11/16 1529  CALCIUM 8.0* 7.7*  MG 1.6* 1.6*    CBC  Recent Labs Lab 11/05/16 0644 11/11/16 0305 11/11/16 1529  WBC 9.5 14.8* 38.6*  HGB 11.7* 12.5* 13.0  HCT 33.5* 36.9* 39.6*  PLT 223 440 452*    Coag's No results for input(s): APTT, INR in the last 168 hours.  Sepsis Markers  Recent Labs Lab 11/11/16 1529 11/11/16 2142  LATICACIDVEN 1.0 0.8  PROCALCITON 0.34  --     ABG  Recent Labs Lab 11/11/16 1251  PHART 7.35  PCO2ART 50*  PO2ART 106    Liver Enzymes  Recent Labs Lab 11/11/16 0305 11/11/16 1529  AST 53* 41  ALT 37 33  ALKPHOS 96 85  BILITOT 1.2 1.1  ALBUMIN 2.5* 2.3*    Cardiac Enzymes  Recent Labs Lab 11/11/16 0305 11/11/16 1529 11/11/16 2142  TROPONINI <0.03 <0.03 <0.03    Glucose  Recent Labs Lab 11/11/16 0939 11/11/16 1650 11/11/16 1942 11/12/16 0000 11/12/16 0409  GLUCAP 131* 202* 191* 164* 136*    Imaging Dg Abd 1 View  Result Date: 11/11/2016 CLINICAL DATA:  Evaluate OG tube placement. EXAM: ABDOMEN - 1 VIEW COMPARISON:  None. FINDINGS: The OG tube terminates within the stomach. IMPRESSION: The OG tube terminates in the stomach. Electronically Signed   By: Dorise Bullion III M.D   On: 11/11/2016 17:26   Ct Abdomen Pelvis W Contrast  Result Date: 11/11/2016 CLINICAL DATA:  56 y/o M; right flank and abdominal pain. Appendectomy 10 days ago. EXAM: CT ABDOMEN AND PELVIS WITH CONTRAST TECHNIQUE: Multidetector CT imaging of the abdomen and pelvis was performed using the standard protocol following bolus administration of intravenous contrast. CONTRAST:  100 cc Isovue 3 7 COMPARISON:  None. FINDINGS: Lower chest: Stable 4-5 mm nodules  along the right major fissure (series 4, image 7 and 5). Hepatobiliary: No focal liver abnormality is seen. Status post cholecystectomy. No biliary dilatation. Pancreas: Unremarkable. No pancreatic ductal dilatation or surrounding inflammatory changes. Spleen: Normal in size without focal abnormality. Adrenals/Urinary Tract: Left adrenal scarring probably representing sequelae of prior infection or hemorrhage. Nonspecific 13 mm hypodense focus within the left adrenal gland measures 8 Hounsfield units on the coronal reconstruction (series 5, image 65) and may represent an adenoma or possibly scarring. Stable left kidney parapelvic cysts. No hydronephrosis or obstructive uropathy. Normal bladder. Stomach/Bowel: Open ventral lower abdominal wall an broad-based left lower quadrant herniation in the abdominal wall measuring approximately 13 x 15 cm (ML by CC). Through the herniation extending into the open wound I loops of small and large bowel which demonstrate wall thickening and surrounding inflammatory changes. There is proximal mild small bowel obstruction. Additionally, there is fluid and small foci of air within the deep portion of the abdominal wound near the pubic symphysis without Organization into a discrete abscess. In the right lower quadrant appendectomy bed there is stranding of fat without a discrete fluid collection which may represent phlegmonous or postsurgical changes. Vascular/Lymphatic: No significant vascular findings are present. No enlarged abdominal or pelvic lymph nodes. Reproductive: Central prostatic calcification. Other: Small volume of peritoneal ascites. Musculoskeletal: No fracture is seen. IMPRESSION: 1. Open ventral lower abdominal wall wound and broad-based left lower quadrant abdominal wall hernia at the deep margin of the open wound measuring approximately 13 x 15 cm (ML by CC). Through the herniation extending into the open wound are loops of small and large bowel which demonstrate  wall thickening and surrounding inflammatory changes. There is proximal mild small bowel obstruction. 2. Fat stranding in the right lower quadrant appendectomy bed may represent phlegmonous or postsurgical changes. No peritoneal abscess identified. 3. Left adrenal hypodense nodule measures 8 Hounsfield units, probably an adenoma or scarring. 4. 4-5 mm pulmonary nodules along right major fissure. No follow-up needed if patient is low-risk (and has no known or suspected primary neoplasm). Non-contrast chest CT can be considered in 12 months if patient is high-risk. This recommendation follows the consensus statement: Guidelines for Management of Incidental Pulmonary Nodules Detected on CT Images: From  the Fleischner Society 2017; Radiology 2017; [id number]. Electronically Signed   By: Kristine Garbe M.D.   On: 11/11/2016 06:05   US Venous Img Upper Uni Right  Result Date: 11/11/2016 CLINICAL DATA:  Right arm edema. EXAM: RIGHT UPPER EXTREMITY VENOUS DOPPLER ULTRASOUND TECHNIQUE: Gray-scale sonography with graded compression, as well as color Doppler and duplex ultrasound were performed to evaluate the upper extremity deep venous system from the level of the subclavian vein and including the jugular, axillary, basilic, radial, ulnar and upper cephalic vein. Spectral Doppler was utilized to evaluate flow at rest and with distal augmentation maneuvers. COMPARISON:  None. FINDINGS: Contralateral Subclavian Vein: Respiratory phasicity is normal and symmetric with the symptomatic side. No evidence of thrombus. Normal compressibility. Internal Jugular Vein: No evidence of thrombus. Normal compressibility, respiratory phasicity and response to augmentation. Subclavian Vein: No evidence of thrombus. Normal compressibility, respiratory phasicity and response to augmentation. Axillary Vein: No evidence of thrombus. Normal compressibility, respiratory phasicity and response to augmentation. Cephalic Vein:  Nonocclusive thrombus is identified in the cephalic vein above the antecubital fossa. Basilic Vein: No evidence of thrombus. Normal compressibility, respiratory phasicity and response to augmentation. Brachial Veins: No evidence of thrombus. Normal compressibility, respiratory phasicity and response to augmentation. Radial Veins: No evidence of thrombus. Normal compressibility, respiratory phasicity and response to augmentation. Ulnar Veins: No evidence of thrombus. Normal compressibility, respiratory phasicity and response to augmentation. Venous Reflux:  None visualized. Other Findings:  No abnormal fluid collections. IMPRESSION: No evidence of DVT within the right upper extremity. There is evidence of superficial thrombophlebitis with nonocclusive thrombus identified within a segment of the cephalic vein above the antecubital fossa. Electronically Signed   By: Aletta Edouard M.D.   On: 11/11/2016 17:17   Dg Chest Port 1 View  Result Date: 11/11/2016 CLINICAL DATA:  Endotracheal tube placement and central line placement. EXAM: PORTABLE CHEST 1 VIEW COMPARISON:  Radiographs of November 01, 2016. FINDINGS: Stable cardiomegaly. Hypoinflation of the lungs is noted. Stable bibasilar subsegmental atelectasis. Endotracheal tube is seen projected over tracheal air shadow with distal tip 4 cm above the carina. Left internal jugular catheter is noted with distal tip in expected position of left brachycephalic vein. No pneumothorax or pleural effusion is noted. Bony thorax is unremarkable. IMPRESSION: Endotracheal tube and left internal jugular catheter in grossly good position. Stable bibasilar subsegmental atelectasis. Hypoinflation of the lungs is noted. Electronically Signed   By: Marijo Conception, M.D.   On: 11/11/2016 12:33   STUDIES:  CT Abd Pelvis 05/10>>Open ventral lower abdominal wall wound and broad-based left lower quadrant abdominal wall hernia at the deep margin of the open wound measuring approximately 13  x 15 cm (ML by CC). Through the herniation extending into the open wound are loops of small and large bowel which demonstrate wall thickening and surrounding inflammatory changes. There is proximal mild small bowel obstruction. Fat stranding in the right lower quadrant appendectomy bed may represent phlegmonous or postsurgical changes. No peritoneal abscess identified. Left adrenal hypodense nodule measures 8 Hounsfield units, probably an adenoma or scarring. 4-5 mm pulmonary nodules along right major fissure. No follow-up needed if patient is low-risk (and has no known or suspected primary neoplasm). Non-contrast chest CT can be considered in 12 months if patient is high-risk. This recommendation follows the consensus statement: Guidelines for Management of Incidental Pulmonary Nodules Detected on CT Images: From the Fleischner Society 2017; Radiology 2017; [phone]  CULTURES: Aerobic/Anaerobic Abd Wound 05/10>> Blood x2 05/10>>  ANTIBIOTICS: Zosyn 05/10>>  SIGNIFICANT EVENTS: 05/10-Pt admitted to ICU intubated post/op  LINES/TUBES: ETT 05/10>> Left IJ CVL 05/10>>  ASSESSMENT / PLAN:  PULMONARY A: Mechanical Intubation s/p Emergent Laparotomy  Hx: Difficult intubation and Obesity  P:   Vent settings established VAP bundle implemented Maintain O2 sats >92% Prn bronchodilator therapy Intermittent CXR    CARDIOVASCULAR A:  No acute issues Hx: HTN P:  Continuous telemetry monitoring Trend troponin's Maintain MAP >65 US Venous Img Upper Right pending rule out DVT   RENAL A:   Hypokalemia  Hypomagnesium  P:   Follow BMET Replace electrolytes as indicated  strict Monitoring of I/O  GASTROINTESTINAL A:   S/P Exploratory Laparotomy with Evisceration of Bowel s/p Laparotomy and Appendectomy on 04/28 Hx: Blood in stool and Gastric ulcer  P:   Protonix for PUD prophylaxis  Surgery consulted appreciate input  Keep NPO will defer to surgery  Midline abdominal wound  vac per surgery  O/G tube placed for decompression OR on 5/11 for a second look and possible debridement per Dr.Pabon HEMATOLOGIC A:   Anemia with acute blood loss s/p exploratory lap P:  Trend CBC  Monitor s/sx of bleeding Transfuse for hgb of <7 Lovenox for VTE prophylaxis   INFECTIOUS A:   Intraabdominal Infection s/p laparotomy and appendectomy on 04/28 Leukocytosis  P:   Follow CBC and monitor fever curve Follow cultures Continue zosyn  ENDOCRINE A:   Hyperglycemia P:   CBG's q4hrs SSI   NEUROLOGIC A:   Vent associated discomfort P:   RASS goal: 0 to -1 Propofol and Fentanyl gtt to maintain RASS goal  Prn Fentanyl for pain management     Bincy Varughese,AG-ACNP Pulmonary & Critical Care

## 2016-11-12 NOTE — Progress Notes (Signed)
Patient left floor to go to OR for exploratory laparoscopy.

## 2016-11-12 NOTE — Anesthesia Preprocedure Evaluation (Signed)
Anesthesia Evaluation  Patient identified by MRN, date of birth, ID band Patient awake    Reviewed: Allergy & Precautions, H&P , NPO status , Patient's Chart, lab work & pertinent test results  History of Anesthesia Complications (+) DIFFICULT AIRWAY and history of anesthetic complications  Airway Mallampati: III  TM Distance: <3 FB Neck ROM: limited    Dental  (+) Poor Dentition, Chipped, Caps   Pulmonary shortness of breath, sleep apnea ,    Pulmonary exam normal breath sounds clear to auscultation       Cardiovascular hypertension, Normal cardiovascular exam Rhythm:regular Rate:Normal     Neuro/Psych PSYCHIATRIC DISORDERS Depression negative neurological ROS     GI/Hepatic negative GI ROS, Neg liver ROS,   Endo/Other  negative endocrine ROS  Renal/GU      Musculoskeletal   Abdominal   Peds  Hematology negative hematology ROS (+)   Anesthesia Other Findings Intubated in ICU  Past Medical History: No date: Blood in stool No date: Difficult intubation No date: Glaucoma     Comment: since 2004 No date: Hypertension     Comment: since 2000 No date: Obesity, unspecified 2011: Ulcer     Comment: gastric No date: Unspecified hemorrhoids without mention of com*  Past Surgical History: 06/23/2016: CARDIAC CATHETERIZATION Left     Comment: Procedure: Left Heart Cath and Coronary               Angiography;  Surgeon: Nelva Bush, MD;                Location: [place] [facility];  Service:               Cardiovascular;  Laterality: Left; 1992: CHOLECYSTECTOMY 05/22/2010: COLONOSCOPY W/ POLYPECTOMY     Comment: 16mmtransverse colon polyp, traditional               serrated adenoma,negative for high grade               dysplasia & malignancy. rectal               polyp-75mm,negative for dysplasia & malignancy. 2010: KNEE SURGERY Left 10/30/2016: LAPAROSCOPIC APPENDECTOMY N/A     Comment: Procedure:  APPENDECTOMY LAPAROSCOPIC changed                to open application of wound vac;  Surgeon:               [name], MD;  Location: [facility] OR[facility];                Service: General;  Laterality: N/A; 11/11/2016: LAPAROTOMY N/A     Comment: Procedure: EXPLORATORY LAPAROTOMY, DEBRIDEMENT              OF ABDOMINAL WOUND, ABDOMINAL [place];                Surgeon: [name], MD;  Location: [facility]               ORS;  Service: General;  Laterality: N/A;  BMI    Body Mass Index:  42.40 kg/m      Reproductive/Obstetrics negative OB ROS                             Anesthesia Physical Anesthesia Plan  ASA: IV  Anesthesia Plan: General ETT   Post-op Pain Management:    Induction: Intravenous  Airway Management Planned: Oral ETT  Additional Equipment:  Intra-op Plan:   Post-operative Plan: Possible Post-op intubation/ventilation  Informed Consent: I have reviewed the patients History and Physical, chart, labs and discussed the procedure including the risks, benefits and alternatives for the proposed anesthesia with the patient or authorized representative who has indicated his/her understanding and acceptance.   Dental Advisory Given  Plan Discussed with: Anesthesiologist, CRNA and Surgeon  Anesthesia Plan Comments: ( Family consented for risks of anesthesia including but not limited to:  - adverse reactions to medications - damage to teeth, lips or other oral mucosa - sore throat or hoarseness - Damage to heart, brain, lungs or loss of life  Family voiced understanding.)        Anesthesia Quick Evaluation

## 2016-11-13 DIAGNOSIS — J9611 Chronic respiratory failure with hypoxia: Secondary | ICD-10-CM

## 2016-11-13 DIAGNOSIS — J9612 Chronic respiratory failure with hypercapnia: Secondary | ICD-10-CM

## 2016-11-13 DIAGNOSIS — R6521 Severe sepsis with septic shock: Secondary | ICD-10-CM

## 2016-11-13 DIAGNOSIS — A419 Sepsis, unspecified organism: Secondary | ICD-10-CM

## 2016-11-13 LAB — GLUCOSE, CAPILLARY
GLUCOSE-CAPILLARY: 144 mg/dL — AB (ref 65–99)
GLUCOSE-CAPILLARY: 148 mg/dL — AB (ref 65–99)
GLUCOSE-CAPILLARY: 135 mg/dL — AB (ref 65–99)
Glucose-Capillary: 104 mg/dL — ABNORMAL HIGH (ref 65–99)
Glucose-Capillary: 124 mg/dL — ABNORMAL HIGH (ref 65–99)
Glucose-Capillary: 140 mg/dL — ABNORMAL HIGH (ref 65–99)

## 2016-11-13 LAB — TROPONIN I: Troponin I: <0.03 ng/mL (ref <0.03)

## 2016-11-13 LAB — CBC
HEMATOCRIT: 32.5 % — AB (ref 40.0–52.0)
Hemoglobin: 11.1 g/dL — ABNORMAL LOW (ref 13.0–18.0)
MCH: 29.6 pg (ref 26.0–34.0)
MCHC: 34.2 g/dL (ref 32.0–36.0)
MCV: 86.7 fL (ref 80.0–100.0)
PLATELETS: 480 10*3/uL — AB (ref 150–440)
RBC: 3.75 MIL/uL — AB (ref 4.40–5.90)
RDW: 13.6 % (ref 11.5–14.5)
WBC: 28.1 10*3/uL — ABNORMAL HIGH (ref 3.8–10.6)

## 2016-11-13 LAB — BASIC METABOLIC PANEL
ANION GAP: 7 (ref 5–15)
BUN: 22 mg/dL — ABNORMAL HIGH (ref 6–20)
CALCIUM: 6.9 mg/dL — AB (ref 8.9–10.3)
CO2: 26 mmol/L (ref 22–32)
CREATININE: 1.53 mg/dL — AB (ref 0.61–1.24)
Chloride: 100 mmol/L — ABNORMAL LOW (ref 101–111)
GFR calc non Af Amer: 48 mL/min — ABNORMAL LOW (ref >60)
GFR, EST AFRICAN AMERICAN: 56 mL/min — AB (ref >60)
Glucose, Bld: 155 mg/dL — ABNORMAL HIGH (ref 65–99)
Potassium: 3.4 mmol/L — ABNORMAL LOW (ref 3.5–5.1)
SODIUM: 133 mmol/L — AB (ref 135–145)

## 2016-11-13 LAB — PROCALCITONIN: Procalcitonin: 0.51 ng/mL

## 2016-11-13 MED ORDER — POTASSIUM CHLORIDE 10 MEQ/50ML IV SOLN
10.0000 meq | INTRAVENOUS | Status: AC
  Administered 2016-11-13 (×2): 10 meq via INTRAVENOUS
  Filled 2016-11-13 (×2): qty 50

## 2016-11-13 MED ORDER — SODIUM CHLORIDE 0.9 % IV BOLUS (SEPSIS)
250.0000 mL | Freq: Once | INTRAVENOUS | Status: AC
  Administered 2016-11-13: 250 mL via INTRAVENOUS

## 2016-11-13 NOTE — Anesthesia Preprocedure Evaluation (Signed)
Anesthesia Evaluation  Patient identified by MRN, date of birth, ID band Patient awake    Reviewed: Allergy & Precautions, H&P , NPO status , Patient's Chart, lab work & pertinent test results  History of Anesthesia Complications (+) DIFFICULT AIRWAY and history of anesthetic complications  Airway Mallampati: III  TM Distance: <3 FB Neck ROM: limited    Dental  (+) Poor Dentition, Chipped, Caps   Pulmonary shortness of breath, sleep apnea ,    Pulmonary exam normal breath sounds clear to auscultation       Cardiovascular hypertension, Normal cardiovascular exam Rhythm:regular Rate:Normal     Neuro/Psych PSYCHIATRIC DISORDERS Depression negative neurological ROS     GI/Hepatic negative GI ROS, Neg liver ROS,   Endo/Other  negative endocrine ROS  Renal/GU      Musculoskeletal   Abdominal   Peds  Hematology negative hematology ROS (+)   Anesthesia Other Findings Intubated in ICU  Past Medical History: No date: Blood in stool No date: Difficult intubation No date: Glaucoma     Comment: since 2004 No date: Hypertension     Comment: since 2000 No date: Obesity, unspecified 2011: Ulcer     Comment: gastric No date: Unspecified hemorrhoids without mention of com*  Past Surgical History: 06/23/2016: CARDIAC CATHETERIZATION Left     Comment: Procedure: Left Heart Cath and Coronary               Angiography;  Surgeon: Nelva Bush, MD;                Location: [place] [facility];  Service:               Cardiovascular;  Laterality: Left; 1992: CHOLECYSTECTOMY 05/22/2010: COLONOSCOPY W/ POLYPECTOMY     Comment: 60mmtransverse colon polyp, traditional               serrated adenoma,negative for high grade               dysplasia & malignancy. rectal               polyp-63mm,negative for dysplasia & malignancy. 2010: KNEE SURGERY Left 10/30/2016: LAPAROSCOPIC APPENDECTOMY N/A     Comment: Procedure:  APPENDECTOMY LAPAROSCOPIC changed                to open application of wound vac;  Surgeon:               [name], MD;  Location: [facility] OR[facility];                Service: General;  Laterality: N/A; 11/11/2016: LAPAROTOMY N/A     Comment: Procedure: EXPLORATORY LAPAROTOMY, DEBRIDEMENT              OF ABDOMINAL WOUND, ABDOMINAL [place];                Surgeon: [name], MD;  Location: [facility]               ORS;  Service: General;  Laterality: N/A;  BMI    Body Mass Index:  42.40 kg/m      Reproductive/Obstetrics negative OB ROS                             Anesthesia Physical  Anesthesia Plan  ASA: IV  Anesthesia Plan: General ETT   Post-op Pain Management:    Induction: Intravenous  Airway Management Planned: Oral ETT  Additional Equipment:  Intra-op Plan:   Post-operative Plan: Possible Post-op intubation/ventilation  Informed Consent: I have reviewed the patients History and Physical, chart, labs and discussed the procedure including the risks, benefits and alternatives for the proposed anesthesia with the patient or authorized representative who has indicated his/her understanding and acceptance.   Dental Advisory Given  Plan Discussed with: Anesthesiologist, CRNA and Surgeon  Anesthesia Plan Comments: (Daughter consented via phone [phone] Daughter consented for risks of anesthesia including but not limited to:  - adverse reactions to medications - damage to teeth, lips or other oral mucosa - sore throat or hoarseness - Damage to heart, brain, lungs or loss of life  Daughter voiced understanding.)        Anesthesia Quick Evaluation

## 2016-11-13 NOTE — Progress Notes (Signed)
Propofol decreased to 40 mcg/kg/min.  Pt immediately awoke.  Started moving upper limbs, biting on tube, would not follow commands, ventilator alarming.  Propofol increased to 45 mcg/kg/min, levophed remains at 20 mcg/min.

## 2016-11-13 NOTE — Progress Notes (Signed)
Pt has a 7.5 tube 22 @lip . Secured with [employer]. Tube repositioned from left to right side.

## 2016-11-13 NOTE — Progress Notes (Signed)
PT care assumed, report received. Pt resting in bed, intubated with eyes closed.  PT opens eyes to voice, appears to answer yes and no questions appropriately.  Neuro intact.  NAD noted.

## 2016-11-13 NOTE — Progress Notes (Signed)
Patient has et tube 7.5 noted 22 at lip. Et tube moved to center of mouth. Patient tolerated well

## 2016-11-13 NOTE — Progress Notes (Signed)
PT had period of agitation, with desaturation.  Gave patient increased oxygen breaths.

## 2016-11-13 NOTE — Progress Notes (Signed)
Spoke with Magdelene, NP concerning pt's blood pressure that is continuing to decrease.  Pt is on propofol and fentanyl for adequate sedation and is currently on levophed at 20 mcg/min.  Concern for belly perfusion.  Continuing to decrease propofol level in hopes to decrease levophed but still maintain adequate level of sedation.  Per NP, give a one time dose of normal saline 250cc bolus and reassess.

## 2016-11-13 NOTE — Progress Notes (Signed)
1 Day Post-Op   Subjective:  Patient remains intubated and sedated in the ICU. Uneventful night per nursing staff.  Vital signs in last 24 hours: Temp:  [97.8 F (36.6 C)-100.2 F (37.9 C)] 99.5 F (37.5 C) (05/12 0400) Pulse Rate:  [75-93] 86 (05/12 0845) Resp:  [14-24] 24 (05/12 0845) BP: (79-116)/(44-61) 109/59 (05/12 0845) SpO2:  [99 %-100 %] 99 % (05/12 0845) FiO2 (%):  [40 %-60 %] 55 % (05/12 0406) Last BM Date:  (UTA)  Intake/Output from previous day: 05/11 0701 - 05/12 0700 In: 4247.4 [I.V.:4072.4; IV Piggyback:100] Out: 4300 [Urine:950; Emesis/NG output:900; Drains:1550; Blood:300]  GI: Abdomen is large, nondistended, wound VAC in place with a good seal with serous output.  Lab Results:  CBC  Recent Labs  11/12/16 0431 11/13/16 0434  WBC 21.2* 28.1*  HGB 10.8* 11.1*  HCT 31.5* 32.5*  PLT 427 480*   CMP     Component Value Date/Time   NA 133 (L) 11/13/2016 0539   NA 147 (H) 06/17/2016 1329   K 3.4 (L) 11/13/2016 0539   CL 100 (L) 11/13/2016 0539   CO2 26 11/13/2016 0539   GLUCOSE 155 (H) 11/13/2016 0539   BUN 22 (H) 11/13/2016 0539   BUN 21 06/17/2016 1329   CREATININE 1.53 (H) 11/13/2016 0539   CALCIUM 6.9 (L) 11/13/2016 0539   PROT 5.3 (L) 11/12/2016 0431   PROT 7.3 05/04/2016 1021   ALBUMIN 2.2 (L) 11/12/2016 0431   ALBUMIN 4.1 05/04/2016 1021   AST 32 11/12/2016 0431   ALT 24 11/12/2016 0431   ALKPHOS 64 11/12/2016 0431   BILITOT 0.8 11/12/2016 0431   BILITOT 0.5 05/04/2016 1021   GFRNONAA 48 (L) 11/13/2016 0539   GFRAA 56 (L) 11/13/2016 0539   PT/INR No results for input(s): LABPROT, INR in the last 72 hours.  Studies/Results: Dg Abd 1 View  Result Date: 11/11/2016 CLINICAL DATA:  Evaluate OG tube placement. EXAM: ABDOMEN - 1 VIEW COMPARISON:  None. FINDINGS: The OG tube terminates within the stomach. IMPRESSION: The OG tube terminates in the stomach. Electronically Signed   By: Dorise Bullion III M.D   On: 11/11/2016 17:26   US  Venous Img Upper Uni Right  Result Date: 11/11/2016 CLINICAL DATA:  Right arm edema. EXAM: RIGHT UPPER EXTREMITY VENOUS DOPPLER ULTRASOUND TECHNIQUE: Gray-scale sonography with graded compression, as well as color Doppler and duplex ultrasound were performed to evaluate the upper extremity deep venous system from the level of the subclavian vein and including the jugular, axillary, basilic, radial, ulnar and upper cephalic vein. Spectral Doppler was utilized to evaluate flow at rest and with distal augmentation maneuvers. COMPARISON:  None. FINDINGS: Contralateral Subclavian Vein: Respiratory phasicity is normal and symmetric with the symptomatic side. No evidence of thrombus. Normal compressibility. Internal Jugular Vein: No evidence of thrombus. Normal compressibility, respiratory phasicity and response to augmentation. Subclavian Vein: No evidence of thrombus. Normal compressibility, respiratory phasicity and response to augmentation. Axillary Vein: No evidence of thrombus. Normal compressibility, respiratory phasicity and response to augmentation. Cephalic Vein: Nonocclusive thrombus is identified in the cephalic vein above the antecubital fossa. Basilic Vein: No evidence of thrombus. Normal compressibility, respiratory phasicity and response to augmentation. Brachial Veins: No evidence of thrombus. Normal compressibility, respiratory phasicity and response to augmentation. Radial Veins: No evidence of thrombus. Normal compressibility, respiratory phasicity and response to augmentation. Ulnar Veins: No evidence of thrombus. Normal compressibility, respiratory phasicity and response to augmentation. Venous Reflux:  None visualized. Other Findings:  No abnormal fluid  collections. IMPRESSION: No evidence of DVT within the right upper extremity. There is evidence of superficial thrombophlebitis with nonocclusive thrombus identified within a segment of the cephalic vein above the antecubital fossa. Electronically  Signed   By: Aletta Edouard M.D.   On: 11/11/2016 17:17   Dg Chest Port 1 View  Result Date: 11/11/2016 CLINICAL DATA:  Endotracheal tube placement and central line placement. EXAM: PORTABLE CHEST 1 VIEW COMPARISON:  Radiographs of November 01, 2016. FINDINGS: Stable cardiomegaly. Hypoinflation of the lungs is noted. Stable bibasilar subsegmental atelectasis. Endotracheal tube is seen projected over tracheal air shadow with distal tip 4 cm above the carina. Left internal jugular catheter is noted with distal tip in expected position of left brachycephalic vein. No pneumothorax or pleural effusion is noted. Bony thorax is unremarkable. IMPRESSION: Endotracheal tube and left internal jugular catheter in grossly good position. Stable bibasilar subsegmental atelectasis. Hypoinflation of the lungs is noted. Electronically Signed   By: Marijo Conception, M.D.   On: 11/11/2016 12:33    Assessment/Plan: 56 year old male with complicated surgical medical history. 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 Appreciate critical care and pharmacy assistance with the scope couldn't patient. Plan to keep him intubated and sedated today with a return trip to the operating room tomorrow for an additional washout and hopefully to start attempting to close his midline. Wean off of vasopressors as tolerated. Continue IV antibiotics and critical care support.   Clayburn Pert, MD [facility]  [facility] [phone] Night [facility] [phone]  11/13/2016

## 2016-11-13 NOTE — Progress Notes (Signed)
PULMONARY / CRITICAL CARE MEDICINE   Name: Peter [name] MRN: [id number] DOB: 1960-10-26    ADMISSION DATE:  11/11/2016   Pt profile: This is a 55 yo male with PMH of Gastric Ulcer, Obesity, HTN, Glaucoma, Difficult Intubation, and Blood in Stool.  He presented to [facility] ER 05/10 with 10/10 sharp right sided flank pain radiating into the abdomen onset this am 05/10 at 1 am with evisceration of bowel.  He is s/p open appendectomy on 04/28 he was discharged from [facility] with a midline abdominal wound vac due to high risk of wound infection.  Surgical team consulted 05/10 and pt taken emergently to OR for laparotomy due to soft tissue infection of the abdominal wall at previous surgical site causing evisceration with necrotizing infection to the left side of the abdominal wall.  During laparotomy abdominal wall debridement of skin, subq, muscle, and facia was performed and abthera abdominal wound vac placed.  Pt remained mechanically intubated and admitted to ICU for further workup and management  Allergies  Allergen Reactions  . Isosorbide     Headache, nausea, vomiting    No current facility-administered medications on file prior to encounter.    Current Outpatient Prescriptions on File Prior to Encounter  Medication Sig  . aspirin EC 81 MG tablet Take 81 mg by mouth daily.  Marland Kitchen atorvastatin (LIPITOR) 40 MG tablet Take 1 tablet (40 mg total) by mouth daily. (Patient taking differently: Take 40 mg by mouth every evening. )  . EQUETRO 200 MG CP12 12 hr capsule Take 400-600 mg by mouth 2 (two) times daily. Take 400 mg in the morning & 600 mg in the evening.  . hydrALAZINE (APRESOLINE) 25 MG tablet TAKE 1 TABLET(25 MG) BY MOUTH THREE TIMES DAILY  . ibuprofen (ADVIL,MOTRIN) 200 MG tablet Take 800 mg by mouth 3 (three) times daily as needed for headache or moderate pain.  Marland Kitchen lamoTRIgine (LAMICTAL) 200 MG tablet Take 400 mg by mouth at bedtime.   Marland Kitchen losartan (COZAAR) 100 MG tablet TAKE 1 TABLET BY MOUTH  EVERY DAY  . LUMIGAN 0.01 % SOLN Place 1 drop into both eyes at bedtime.   Marland Kitchen MAGNESIUM-OXIDE 400 (241.3 Mg) MG tablet Take 400 mg by mouth 2 (two) times daily.  . Nebivolol HCl 20 MG TABS Take 1 tablet (20 mg total) by mouth every evening.  Marland Kitchen OMEGA-3 FATTY ACIDS PO Take 1 capsule by mouth 2 (two) times daily.   Marland Kitchen oxyCODONE (OXY IR/ROXICODONE) 5 MG immediate release tablet Take 1-2 tablets (5-10 mg total) by mouth every 4 (four) hours as needed for severe pain.  . ranitidine (ZANTAC) 300 MG tablet TAKE 1 TABLET(300 MG) BY MOUTH TWICE DAILY  . RUTIN, MISC. NUTRITIONAL FACTORS, TAKE 1 TABLET (500 MG) BY MOUTH DAILY  . sertraline (ZOLOFT) 100 MG tablet TAKE 1 TABLET BY MOUTH TWICE DAILY (Patient taking differently: TAKE 2 TABLETS (200 MG) BY MOUTH DAILY)  . vitamin C (ASCORBIC ACID) 500 MG tablet Take 500 mg by mouth every evening.   . Vitamin D, Ergocalciferol, (DRISDOL) 50000 units CAPS capsule Take 1 capsule (50,000 Units total) by mouth every 7 (seven) days.  Marland Kitchen amLODipine (NORVASC) 10 MG tablet TAKE 1 TABLET BY MOUTH EVERY DAY (Patient not taking: Reported on 11/11/2016)  . clobetasol cream (TEMOVATE) 4.08 % Apply 1 application topically at bedtime as needed.  . fluticasone (FLONASE) 50 MCG/ACT nasal spray Place 2 sprays into both nostrils daily. (Patient taking differently: Place 2 sprays into both nostrils daily as  needed for allergies. )  . Garlic 132 MG TABS Take 100 mg by mouth 2 (two) times daily.   . potassium chloride SA (K-DUR,KLOR-CON) 20 MEQ tablet Take 2 tablets (40 mEq total) by mouth 3 (three) times daily.  Marland Kitchen spironolactone (ALDACTONE) 50 MG tablet Take 1 tablet (50 mg total) by mouth daily. (Patient not taking: Reported on 09/21/2016)   REVIEW OF SYSTEMS:   Unable to assess pt intubated   SUBJECTIVE:  Patient remains critically ill, sedated, intubated and on mechanical ventilation with plans to go back to the OR on 5/11.  VITAL SIGNS: BP 97/82   Pulse 84   Temp 99.1 F (37.3  C) (Axillary)   Resp (!) 24   Ht 6' (1.829 m)   Wt (!) 312 lb 9.8 oz (141.8 kg)   SpO2 99%   BMI 42.40 kg/m   HEMODYNAMICS: CVP:  [1 mmHg-15 mmHg] 1 mmHg  VENTILATOR SETTINGS: Vent Mode: PRVC FiO2 (%):  [35 %-60 %] 35 % Set Rate:  [15 bmp-24 bmp] 24 bmp Vt Set:  [500 mL] 500 mL PEEP:  [5 cmH20] 5 cmH20  INTAKE / OUTPUT: I/O last 3 completed shifts: In: 9230.5 [P.O.:1; I.V.:8554.5; Other:75; IV Piggyback:600] Out: 4401 [UUVOZ:3664; Emesis/NG output:900; Drains:2050; Other:600; Blood:300]  PHYSICAL EXAMINATION: General: obese Caucasian critically ill male,intubated and on mechanical ventillation Neuro: sedated, PERRL HEENT: AT,[place], No jvd, PERRLA Cardiovascular: S1S2, Regular, no M/R/G Lung: Decreased air entry bilaterally. Abdomen: no audible BS, midline abdominal wound vac in place, obese Musculoskeletal: normal bulk and tone, no edema Skin: large midline abdominal wound vac in place dry and intact  LABS:  BMET  Recent Labs Lab 11/11/16 1529 11/12/16 0431 11/13/16 0539  NA 135 136 133*  K 3.3* 3.7 3.4*  CL 99* 99* 100*  CO2 26 30 26   BUN 14 19 22*  CREATININE 1.12 1.68* 1.53*  GLUCOSE 212* 137* 155*    Electrolytes  Recent Labs Lab 11/11/16 0305 11/11/16 1529 11/12/16 0431 11/13/16 0539  CALCIUM 8.0* 7.7* 7.3* 6.9*  MG 1.6* 1.6* 2.0  --   PHOS  --   --  6.4*  --     CBC  Recent Labs Lab 11/11/16 1529 11/12/16 0431 11/13/16 0434  WBC 38.6* 21.2* 28.1*  HGB 13.0 10.8* 11.1*  HCT 39.6* 31.5* 32.5*  PLT 452* 427 480*    Coag's No results for input(s): APTT, INR in the last 168 hours.  Sepsis Markers  Recent Labs Lab 11/11/16 1529 11/11/16 2142 11/12/16 0431 11/13/16 0434  LATICACIDVEN 1.0 0.8  --   --   PROCALCITON 0.34  --  0.80 0.51    ABG  Recent Labs Lab 11/11/16 1251  PHART 7.35  PCO2ART 50*  PO2ART 106    Liver Enzymes  Recent Labs Lab 11/11/16 0305 11/11/16 1529 11/12/16 0431  AST 53* 41 32  ALT 37 33 24   ALKPHOS 96 85 64  BILITOT 1.2 1.1 0.8  ALBUMIN 2.5* 2.3* 2.2*    Cardiac Enzymes  Recent Labs Lab 11/11/16 1529 11/11/16 2142 11/13/16 0434  TROPONINI <0.03 <0.03 <0.03    Glucose  Recent Labs Lab 11/12/16 1158 11/12/16 1801 11/12/16 2027 11/13/16 0000 11/13/16 0401 11/13/16 0743  GLUCAP 101* 113* 100* 104* 140* 144*    Imaging No results found. STUDIES:  CT Abd Pelvis 05/10>>Open ventral lower abdominal wall wound and broad-based left lower quadrant abdominal wall hernia at the deep margin of the open wound measuring approximately 13 x 15 cm (ML by CC).  Through the herniation extending into the open wound are loops of small and large bowel which demonstrate wall thickening and surrounding inflammatory changes. There is proximal mild small bowel obstruction. Fat stranding in the right lower quadrant appendectomy bed may represent phlegmonous or postsurgical changes. No peritoneal abscess identified. Left adrenal hypodense nodule measures 8 Hounsfield units, probably an adenoma or scarring. 4-5 mm pulmonary nodules along right major fissure. No follow-up needed if patient is low-risk (and has no known or suspected primary neoplasm). Non-contrast chest CT can be considered in 12 months if patient is high-risk. This recommendation follows the consensus statement: Guidelines for Management of Incidental Pulmonary Nodules Detected on CT Images: From the Fleischner Society 2017; Radiology 2017; [phone]  CULTURES: Aerobic/Anaerobic Abd Wound 05/10>> Blood x2 05/10>>  ANTIBIOTICS: Zosyn 05/10>>  SIGNIFICANT EVENTS: 05/10-Pt admitted to ICU intubated post/op  LINES/TUBES: ETT 05/10>> Left IJ CVL 05/10>>  ASSESSMENT / PLAN:  PULMONARY A: Mechanical Intubation s/p Emergent Laparotomy  Hx: Difficult intubation and Obesity  Ventilator settings: PRVC/24/500/5/35%.  P:   Vent settings established VAP bundle implemented Maintain O2 sats >92% Prn bronchodilator  therapy Intermittent CXR    CARDIOVASCULAR A:  No acute issues Hx: HTN P:  Continuous telemetry monitoring Trend troponin's Maintain MAP >65 US Venous Img Upper Right pending rule out DVT   RENAL A:   Hypokalemia  Hypomagnesium  P:   Follow BMET Replace electrolytes as indicated  strict Monitoring of I/O  GASTROINTESTINAL A:   S/P Exploratory Laparotomy with Evisceration of Bowel s/p Laparotomy and Appendectomy on 04/28 Hx: Blood in stool and Gastric ulcer  P:   Protonix for PUD prophylaxis  Surgery consulted appreciate input  Keep NPO will defer to surgery  Midline abdominal wound vac per surgery  O/G tube placed for decompression OR on 5/11 for a second look and possible debridement per Dr.Pabon  HEMATOLOGIC A:   Anemia with acute blood loss s/p exploratory lap P:  Trend CBC  Monitor s/sx of bleeding Transfuse for hgb of <7 Lovenox for VTE prophylaxis   INFECTIOUS A:   Intraabdominal Infection s/p laparotomy and appendectomy on 04/28 Leukocytosis  P:   Follow CBC and monitor fever curve Follow cultures Continue zosyn  ENDOCRINE A:   Hyperglycemia P:   CBG's q4hrs SSI   NEUROLOGIC A:   Vent associated discomfort P:   RASS goal: 0 to -1 Propofol and Fentanyl gtt to maintain RASS goal  Prn Fentanyl for pain management    Deep Ashby Dawes, M.D. Pulmonary & Critical Care   11/13/2016    Critical Care Attestation.  I have personally obtained a history, examined the patient, evaluated laboratory and imaging results, formulated the assessment and plan and placed orders. The Patient requires high complexity decision making for assessment and support, frequent evaluation and titration of therapies, application of advanced monitoring technologies and extensive interpretation of multiple databases. The patient has critical illness that could lead imminently to failure of 1 or more organ systems and requires the highest level of physician  preparedness to intervene.  Critical Care Time devoted to patient care services described in this note is 35 minutes and is exclusive of time spent in procedures.

## 2016-11-13 NOTE — Progress Notes (Signed)
Pharmacy Consult for Electrolyte Monitoring Indication: Hypokalemia/hypomagnesemia  Allergies  Allergen Reactions  . Isosorbide     Headache, nausea, vomiting    Patient Measurements: Height: 6' (182.9 cm) Weight: (!) 312 lb 9.8 oz (141.8 kg) IBW/kg (Calculated) : 77.6  Vital Signs: Temp: 99.5 F (37.5 C) (05/12 0400) Temp Source: Axillary (05/12 0400) BP: 98/51 (05/12 0600) Pulse Rate: 77 (05/12 0600) Intake/Output from previous day: 05/11 0701 - 05/12 0700 In: 4247.4 [I.V.:4072.4; IV Piggyback:100] Out: 4300 [Urine:950; Emesis/NG output:900; Drains:1550; Blood:300] Intake/Output from this shift: No intake/output data recorded.  Labs:  Recent Labs  11/11/16 0305 11/11/16 1529 11/12/16 0431 11/13/16 0434 11/13/16 0539  WBC 14.8* 38.6* 21.2* 28.1*  --   HGB 12.5* 13.0 10.8* 11.1*  --   HCT 36.9* 39.6* 31.5* 32.5*  --   PLT 440 452* 427 480*  --   CREATININE 1.20 1.12 1.68*  --  1.53*  MG 1.6* 1.6* 2.0  --   --   PHOS  --   --  6.4*  --   --   ALBUMIN 2.5* 2.3* 2.2*  --   --   PROT 6.3* 5.9* 5.3*  --   --   AST 53* 41 32  --   --   ALT 37 33 24  --   --   ALKPHOS 96 85 64  --   --   BILITOT 1.2 1.1 0.8  --   --    Estimated Creatinine Clearance: 76.9 mL/min (A) (by C-G formula based on SCr of 1.53 mg/dL (H)).  Potassium (mmol/L)  Date Value  11/13/2016 3.4 (L)   Phosphorus (mg/dL)  Date Value  11/12/2016 6.4 (H)   Calcium (mg/dL)  Date Value  11/13/2016 6.9 (L)   Albumin Corrected Calcium= 8.34 mg/dl  Assessment: 55 y/o M with ex lap appendectomy 4/28 admitted to ICU due to evisceration. Patient is currently ventilated and sedated.   Plan:  K=3.4. Will give Potassium Chloride 10 meq IV x 2 for a total of 20 meq. Will f/u AM labs.   Chinita Greenland PharmD Clinical Pharmacist 11/13/2016

## 2016-11-14 ENCOUNTER — Encounter
Admission: EM | Disposition: A | Discharge status: Other Acute Inpatient Hospital | Payer: Self-pay | Source: Home / Self Care | Attending: Surgery

## 2016-11-14 ENCOUNTER — Inpatient Hospital Stay: Payer: 59 | Admitting: Anesthesiology

## 2016-11-14 ENCOUNTER — Inpatient Hospital Stay: Payer: 59

## 2016-11-14 DIAGNOSIS — T8131XD Disruption of external operation (surgical) wound, not elsewhere classified, subsequent encounter: Secondary | ICD-10-CM

## 2016-11-14 HISTORY — PX: LAPAROTOMY: SHX154

## 2016-11-14 LAB — BLOOD GAS, ARTERIAL
Acid-Base Excess: 2.6 mmol/L — ABNORMAL HIGH (ref 0.0–2.0)
Bicarbonate: 27.2 mmol/L (ref 20.0–28.0)
FIO2: 0.35
MECHANICAL RATE: 24
O2 Saturation: 99.4 %
PCO2 ART: 41 mmHg (ref 32.0–48.0)
PEEP: 5 cmH2O
Patient temperature: 37
VT: 500 mL
pH, Arterial: 7.43 (ref 7.350–7.450)
pO2, Arterial: 149 mmHg — ABNORMAL HIGH (ref 83.0–108.0)

## 2016-11-14 LAB — URINALYSIS, COMPLETE (UACMP) WITH MICROSCOPIC
Bilirubin Urine: NEGATIVE
Glucose, UA: NEGATIVE mg/dL
Hgb urine dipstick: NEGATIVE
KETONES UR: NEGATIVE mg/dL
Leukocytes, UA: NEGATIVE
Nitrite: NEGATIVE
PROTEIN: NEGATIVE mg/dL
Specific Gravity, Urine: 1.01 (ref 1.005–1.030)
pH: 5 (ref 5.0–8.0)

## 2016-11-14 LAB — BASIC METABOLIC PANEL
Anion gap: 8 (ref 5–15)
BUN: 20 mg/dL (ref 6–20)
CO2: 26 mmol/L (ref 22–32)
CREATININE: 1.38 mg/dL — AB (ref 0.61–1.24)
Calcium: 7 mg/dL — ABNORMAL LOW (ref 8.9–10.3)
Chloride: 99 mmol/L — ABNORMAL LOW (ref 101–111)
GFR calc Af Amer: >60 mL/min (ref >60)
GFR calc non Af Amer: 55 mL/min — ABNORMAL LOW (ref >60)
GLUCOSE: 128 mg/dL — AB (ref 65–99)
Potassium: 3.3 mmol/L — ABNORMAL LOW (ref 3.5–5.1)
Sodium: 133 mmol/L — ABNORMAL LOW (ref 135–145)

## 2016-11-14 LAB — CBC
HCT: 28.4 % — ABNORMAL LOW (ref 40.0–52.0)
Hemoglobin: 9.7 g/dL — ABNORMAL LOW (ref 13.0–18.0)
MCH: 29.6 pg (ref 26.0–34.0)
MCHC: 34.2 g/dL (ref 32.0–36.0)
MCV: 86.5 fL (ref 80.0–100.0)
PLATELETS: 425 10*3/uL (ref 150–440)
RBC: 3.28 MIL/uL — ABNORMAL LOW (ref 4.40–5.90)
RDW: 13.2 % (ref 11.5–14.5)
WBC: 19.1 10*3/uL — AB (ref 3.8–10.6)

## 2016-11-14 LAB — GLUCOSE, CAPILLARY
GLUCOSE-CAPILLARY: 113 mg/dL — AB (ref 65–99)
GLUCOSE-CAPILLARY: 115 mg/dL — AB (ref 65–99)
Glucose-Capillary: 122 mg/dL — ABNORMAL HIGH (ref 65–99)
Glucose-Capillary: 125 mg/dL — ABNORMAL HIGH (ref 65–99)
Glucose-Capillary: 88 mg/dL (ref 65–99)

## 2016-11-14 LAB — MAGNESIUM: Magnesium: 1.6 mg/dL — ABNORMAL LOW (ref 1.7–2.4)

## 2016-11-14 LAB — TRIGLYCERIDES: Triglycerides: 282 mg/dL — ABNORMAL HIGH (ref <150)

## 2016-11-14 SURGERY — LAPAROTOMY, EXPLORATORY
Anesthesia: General

## 2016-11-14 MED ORDER — SODIUM CHLORIDE 0.9 % IV SOLN
INTRAVENOUS | Status: DC | PRN
  Administered 2016-11-14: 12:00:00 via INTRAVENOUS

## 2016-11-14 MED ORDER — SEVOFLURANE IN SOLN
RESPIRATORY_TRACT | Status: AC
  Filled 2016-11-14: qty 250

## 2016-11-14 MED ORDER — LIDOCAINE HCL (PF) 1 % IJ SOLN
INTRAMUSCULAR | Status: AC
  Filled 2016-11-14: qty 30

## 2016-11-14 MED ORDER — MIDAZOLAM HCL 2 MG/2ML IJ SOLN
INTRAMUSCULAR | Status: AC
  Filled 2016-11-14: qty 2

## 2016-11-14 MED ORDER — FENTANYL CITRATE (PF) 100 MCG/2ML IJ SOLN
INTRAMUSCULAR | Status: DC | PRN
  Administered 2016-11-14 (×2): 50 ug via INTRAVENOUS

## 2016-11-14 MED ORDER — MAGNESIUM SULFATE 2 GM/50ML IV SOLN
2.0000 g | Freq: Once | INTRAVENOUS | Status: AC
  Administered 2016-11-14: 2 g via INTRAVENOUS
  Filled 2016-11-14: qty 50

## 2016-11-14 MED ORDER — MIDAZOLAM HCL 2 MG/2ML IJ SOLN
INTRAMUSCULAR | Status: DC | PRN
  Administered 2016-11-14: 2 mg via INTRAVENOUS

## 2016-11-14 MED ORDER — FENTANYL CITRATE (PF) 100 MCG/2ML IJ SOLN
INTRAMUSCULAR | Status: AC
  Filled 2016-11-14: qty 2

## 2016-11-14 MED ORDER — BLISTEX MEDICATED EX OINT
TOPICAL_OINTMENT | CUTANEOUS | Status: DC | PRN
  Administered 2016-11-14 – 2016-11-16 (×2): 1 via TOPICAL
  Filled 2016-11-14: qty 6.3

## 2016-11-14 MED ORDER — POTASSIUM CHLORIDE 10 MEQ/50ML IV SOLN
10.0000 meq | INTRAVENOUS | Status: AC
  Administered 2016-11-14 (×6): 10 meq via INTRAVENOUS
  Filled 2016-11-14 (×6): qty 50

## 2016-11-14 MED ORDER — BUPIVACAINE HCL (PF) 0.5 % IJ SOLN
INTRAMUSCULAR | Status: AC
  Filled 2016-11-14: qty 30

## 2016-11-14 MED ORDER — ROCURONIUM BROMIDE 100 MG/10ML IV SOLN
INTRAVENOUS | Status: DC | PRN
  Administered 2016-11-14: 30 mg via INTRAVENOUS
  Administered 2016-11-14: 50 mg via INTRAVENOUS
  Administered 2016-11-14: 10 mg via INTRAVENOUS

## 2016-11-14 MED ORDER — ROCURONIUM BROMIDE 100 MG/10ML IV SOLN
INTRAVENOUS | Status: AC
  Filled 2016-11-14: qty 1

## 2016-11-14 SURGICAL SUPPLY — 35 items
BULB RESERV EVAC DRAIN JP 100C (MISCELLANEOUS) IMPLANT
CANISTER SUCT 1200ML W/VALVE (MISCELLANEOUS) ×2 IMPLANT
CATH TRAY 16F METER LATEX (MISCELLANEOUS) IMPLANT
CHLORAPREP W/TINT 26ML (MISCELLANEOUS) ×2 IMPLANT
DRAIN CHANNEL JP 15F RND 16 (MISCELLANEOUS) IMPLANT
DRAPE LAPAROTOMY 100X77 ABD (DRAPES) ×2 IMPLANT
DRSG OPSITE POSTOP 4X8 (GAUZE/BANDAGES/DRESSINGS) ×2 IMPLANT
ELECT CAUTERY BLADE 6.4 (BLADE) ×2 IMPLANT
ELECT REM PT RETURN 9FT ADLT (ELECTROSURGICAL) ×2
ELECTRODE REM PT RTRN 9FT ADLT (ELECTROSURGICAL) ×1 IMPLANT
GAUZE SPONGE 4X4 12PLY STRL (GAUZE/BANDAGES/DRESSINGS) ×2 IMPLANT
GLOVE BIO SURGEON STRL SZ7.5 (GLOVE) ×4 IMPLANT
GLOVE INDICATOR 8.0 STRL GRN (GLOVE) ×8 IMPLANT
GOWN STRL REUS W/ TWL LRG LVL3 (GOWN DISPOSABLE) ×2 IMPLANT
GOWN STRL REUS W/TWL LRG LVL3 (GOWN DISPOSABLE) ×2
KIT RM TURNOVER STRD PROC AR (KITS) ×2 IMPLANT
LABEL OR SOLS (LABEL) ×2 IMPLANT
LIGASURE IMPACT 36 18CM CVD LR (INSTRUMENTS) IMPLANT
NS IRRIG 1000ML POUR BTL (IV SOLUTION) ×2 IMPLANT
PACK BASIN MAJOR ARMC (MISCELLANEOUS) IMPLANT
PACK COLON CLEAN CLOSURE (MISCELLANEOUS) ×2 IMPLANT
SPONGE ABDOMINAL VAC ABTHERA (MISCELLANEOUS) ×2 IMPLANT
STAPLER SKIN PROX 35W (STAPLE) ×2 IMPLANT
SUT PDS AB 1 TP1 54 (SUTURE) ×4 IMPLANT
SUT PDS AB 1 TP1 96 (SUTURE) IMPLANT
SUT SILK 2 0 SH (SUTURE) ×2 IMPLANT
SUT SILK 3 0 (SUTURE) ×1
SUT SILK 3-0 (SUTURE) ×3 IMPLANT
SUT SILK 3-0 18XBRD TIE 12 (SUTURE) ×1 IMPLANT
SUT SILK 3-0 SH-1 18XCR BRD (SUTURE) ×1
SUT VIC AB 3-0 SH 27 (SUTURE) ×1
SUT VIC AB 3-0 SH 27X BRD (SUTURE) ×1 IMPLANT
SUTURE SILK 3-0 SH-1 18XCR BRD (SUTURE) ×1 IMPLANT
TUBE GASTROSTOMY 18F 011218 (TUBING) ×2 IMPLANT
WND VAC CANISTER 500ML (MISCELLANEOUS) ×2 IMPLANT

## 2016-11-14 NOTE — Progress Notes (Signed)
2 Days Post-Op   Subjective:  Patient remains in ICU intubated and sedated. Continues intermittently require vasopressors for blood pressure control. This appears to be related to sedation requirements. No acute events overnight.  Vital signs in last 24 hours: Temp:  [98.5 F (36.9 C)-100.1 F (37.8 C)] 98.7 F (37.1 C) (05/13 0400) Pulse Rate:  [73-97] 74 (05/13 0600) Resp:  [24-27] 24 (05/13 0600) BP: (89-125)/(48-88) 97/69 (05/13 0600) SpO2:  [97 %-100 %] 100 % (05/13 0759) FiO2 (%):  [30 %-45 %] 30 % (05/13 0759) Last BM Date:  (UTA)  Intake/Output from previous day: 05/12 0701 - 05/13 0700 In: 10514.2 [I.V.:10114.2; IV Piggyback:400] Out: 3650 [Urine:1450; Emesis/NG output:750; Drains:1450]  GI: Abdomen is very large, nondistended, midline wound VAC in place with a good seal. Serosanguineous fluid within the wound VAC canister.  Lab Results:  CBC  Recent Labs  11/13/16 0434 11/14/16 0331  WBC 28.1* 19.1*  HGB 11.1* 9.7*  HCT 32.5* 28.4*  PLT 480* 425   CMP     Component Value Date/Time   NA 133 (L) 11/14/2016 0331   NA 147 (H) 06/17/2016 1329   K 3.3 (L) 11/14/2016 0331   CL 99 (L) 11/14/2016 0331   CO2 26 11/14/2016 0331   GLUCOSE 128 (H) 11/14/2016 0331   BUN 20 11/14/2016 0331   BUN 21 06/17/2016 1329   CREATININE 1.38 (H) 11/14/2016 0331   CALCIUM 7.0 (L) 11/14/2016 0331   PROT 5.3 (L) 11/12/2016 0431   PROT 7.3 05/04/2016 1021   ALBUMIN 2.2 (L) 11/12/2016 0431   ALBUMIN 4.1 05/04/2016 1021   AST 32 11/12/2016 0431   ALT 24 11/12/2016 0431   ALKPHOS 64 11/12/2016 0431   BILITOT 0.8 11/12/2016 0431   BILITOT 0.5 05/04/2016 1021   GFRNONAA 55 (L) 11/14/2016 0331   GFRAA >60 11/14/2016 0331   PT/INR No results for input(s): LABPROT, INR in the last 72 hours.  Studies/Results: Dg Chest 1 View  Result Date: 11/14/2016 CLINICAL DATA:  Routine chest x-ray.  Shortness of breath. EXAM: CHEST 1 VIEW COMPARISON:  Nov 11, 2016 FINDINGS: The ETT is in  good position. The NG tube terminates below today's film. No pneumothorax. Increased opacity in medial right lung base. Increased opacity in the left retrocardiac region. No other interval changes. IMPRESSION: 1. The ETT is in good position. 2. Increased opacity in the medial right lung base could represent atelectasis or developing infiltrate. 3. Increasing opacity in the left retrocardiac region could also represent atelectasis or infiltrate. Recommend follow-up to resolution. Electronically Signed   By: Dorise Bullion III M.D   On: 11/14/2016 07:16    Assessment/Plan: 56 year old male status post evisceration of small bowel from midline wound secondary to necrotizing infection. Clinically improving. Appreciate critical care and pharmacy assistance with this complicated patient. Plan to return to the operating room today for additional washout and to begin attempting to close the fascia. Possible placement of surgical feeding tube in the operating room today as well.   Clayburn Pert, MD [facility]  [facility] [phone] Night [facility] [phone]  11/14/2016

## 2016-11-14 NOTE — Progress Notes (Signed)
[place] responded to an OR for Major Life Transition in room IC-01. Pt is intubated and sedated and will have a procedure this morning. RN stated that family should be here around the time of his procedure (10:00 A) [place] provided silent prayer and will check on Pt during my next rounding.    11/14/16 0900  Clinical Encounter Type  Visited With Patient;Health care provider  Visit Type Initial;Spiritual support;Pre-op;Critical Care  Referral From Nurse  Spiritual Encounters  Spiritual Needs Prayer

## 2016-11-14 NOTE — Brief Op Note (Signed)
11/11/2016 - 11/14/2016  1:15 PM  PATIENT:  Peter [name]  56 y.o. male  PRE-OPERATIVE DIAGNOSIS:  Open abdomen  POST-OPERATIVE DIAGNOSIS:  same  PROCEDURE:  Procedure(s): EXPLORATORY LAPAROTOMY; Irrigation, partial closure (N/A)  SURGEON:  Surgeon(s) and Role:    * Clayburn Pert, MD - Primary  PHYSICIAN ASSISTANT:   ASSISTANTS: none   ANESTHESIA:   general  EBL:  Total I/O In: -  Out: 8377 [Urine:1075; Drains:200; Other:350; Blood:30]  BLOOD ADMINISTERED:none  DRAINS: Gastrostomy Tube   LOCAL MEDICATIONS USED:  NONE  SPECIMEN:  No Specimen  DISPOSITION OF SPECIMEN:  N/A  COUNTS:  YES  TOURNIQUET:  * No tourniquets in log *  DICTATION: .Dragon Dictation  PLAN OF CARE: return to ICU  PATIENT DISPOSITION:  ICU - intubated and hemodynamically stable.   Delay start of Pharmacological VTE agent (>24hrs) due to surgical blood loss or risk of bleeding: no

## 2016-11-14 NOTE — Progress Notes (Signed)
[facility] Progess Note    SYNOPSIS   This is a 56 year old morbidly obese male with a history of open appendectomy on 4/28. Readmitted with evisceration with necrotizing fasciitis with septic shock. Now status post laparotomy, debridement, multiple washouts.  ASSESSMENT/PLAN    PULMONARY A: Blood gas: 7.43/41/149/27.2; compensated respiratory failure. Chest x-ray images personally reviewed, 11/14/16; increased right middle lobe infiltrate, possible pneumonia versus atelectasis. P:   -Continue current vent settings. -No plan for extubation as the patient is  to go back to the OR. -New infiltrate, no other evidence to indicate pneumonia, we'll check sputum culture.  VENTILATOR SETTINGS: Vent Mode: PRVC FiO2 (%):  [30 %-45 %] 30 % Set Rate:  [24 bmp] 24 bmp Vt Set:  [500 mL] 500 mL PEEP:  [5 cmH20] 5 cmH20  CARDIOVASCULAR A: Continued septic shock with hypotension. -Currently on Levophed at 13 mics. P:  We now levofloxacin as tolerated. HEMODYNAMICS: CVP:  [3 mmHg-12 mmHg] 9 mmHg  RENAL A:  Acute kidney injury. Creatinine 1.38. Today, down from 1.53. Multiple electrolyte abnormalities, replaced. P:   Continue gentle IV fluids.  INTAKE / OUTPUT:  Intake/Output Summary (Last 24 hours) at 11/14/16 0755 Last data filed at 11/14/16 0640  Gross per 24 hour  Intake         10514.23 ml  Output             3650 ml  Net          6864.23 ml    GASTROINTESTINAL A:   P:   Continue protonix.   HEMATOLOGIC A:  Leukocytosis, likely reactive to sepsis.  P:  Continue to monitor.   INFECTIOUS A:  Septic shock secondary to necrotizing fasciitis of the abdomen, status post washout. P:   Begin antibiotics, continue management per surgery.  Micro/culture results: MRSA PCR positive on 10, negative BCx2 5/10, negative Surgical wound culture 5/10: Klebsiella oxytoca. Sputum: Pending  Antibiotics: Zosyn 5/10>>  ENDOCRINE A:  Elevated blood  glucose. P:   -Appears controlled with sliding scale insulin.  NEUROLOGIC A:  Metabolic encephalopathy, secondary to sepsis P:   RASS goal: -1   SIGNIFICANT EVENTS: 05/10-Pt admitted to ICU intubated post/op  LINES/TUBES: ETT 05/10>> Left IJ CVL 05/10>>  Best Practices  DVT Prophylaxis: Enoxaparin GI Prophylaxis: Protonix   ---------------------------------------   ----------------------------------------   Name: Peter [name] MRN: [id number] DOB: 12-31-1960    ADMISSION DATE:  11/11/2016    SUBJECTIVE:   Pt currently on the ventilator, can not provide history or review of systems.   Review of Systems:  --  VITAL SIGNS: Temp:  [98.5 F (36.9 C)-100.1 F (37.8 C)] 98.7 F (37.1 C) (05/13 0400) Pulse Rate:  [73-97] 74 (05/13 0600) Resp:  [24-27] 24 (05/13 0600) BP: (89-125)/(48-88) 97/69 (05/13 0600) SpO2:  [97 %-100 %] 100 % (05/13 0600) FiO2 (%):  [30 %-45 %] 30 % (05/13 0627)     PHYSICAL EXAMINATION: Physical Examination:   VS: BP 97/69   Pulse 74   Temp 98.7 F (37.1 C) (Oral)   Resp (!) 24   Ht 6' (1.829 m)   Wt (!) 312 lb 9.8 oz (141.8 kg)   SpO2 100%   BMI 42.40 kg/m   General Appearance: No distress  Neuro:without focal findings, mental status reduced HEENT: PERRLA, EOM intact. Pulmonary: normal breath sounds .; Decreased air entry bilaterally.  CardiovascularNormal S1,S2.  No m/r/g.   Abdomen: Benign, Soft, non-tender. Renal:  No costovertebral tenderness  GU:  Not performed at this time. Endocrine: No evident thyromegaly. Skin:   warm, no rashes, no ecchymosis  Extremities: normal, no cyanosis, clubbing.    LABORATORY PANEL:   CBC  Recent Labs Lab 11/14/16 0331  WBC 19.1*  HGB 9.7*  HCT 28.4*  PLT 425    Chemistries   Recent Labs Lab 11/12/16 0431  11/14/16 0331  NA 136  < > 133*  K 3.7  < > 3.3*  CL 99*  < > 99*  CO2 30  < > 26  GLUCOSE 137*  < > 128*  BUN 19  < > 20  CREATININE 1.68*  < > 1.38*    CALCIUM 7.3*  < > 7.0*  MG 2.0  --  1.6*  PHOS 6.4*  --   --   AST 32  --   --   ALT 24  --   --   ALKPHOS 64  --   --   BILITOT 0.8  --   --   < > = values in this interval not displayed.   Recent Labs Lab 11/13/16 1203 11/13/16 1629 11/13/16 2022 11/13/16 2352 11/14/16 0504 11/14/16 0748  GLUCAP 148* 135* 104* 124* 113* 115*    Recent Labs Lab 11/11/16 1251 11/14/16 0335  PHART 7.35 7.43  PCO2ART 50* 41  PO2ART 106 149*    Recent Labs Lab 11/11/16 0305 11/11/16 1529 11/12/16 0431  AST 53* 41 32  ALT 37 33 24  ALKPHOS 96 85 64  BILITOT 1.2 1.1 0.8  ALBUMIN 2.5* 2.3* 2.2*    Cardiac Enzymes  Recent Labs Lab 11/13/16 0434  TROPONINI <0.03    RADIOLOGY:  Dg Chest 1 View  Result Date: 11/14/2016 CLINICAL DATA:  Routine chest x-ray.  Shortness of breath. EXAM: CHEST 1 VIEW COMPARISON:  Nov 11, 2016 FINDINGS: The ETT is in good position. The NG tube terminates below today's film. No pneumothorax. Increased opacity in medial right lung base. Increased opacity in the left retrocardiac region. No other interval changes. IMPRESSION: 1. The ETT is in good position. 2. Increased opacity in the medial right lung base could represent atelectasis or developing infiltrate. 3. Increasing opacity in the left retrocardiac region could also represent atelectasis or infiltrate. Recommend follow-up to resolution. Electronically Signed   By: Dorise Bullion III M.D   On: 11/14/2016 07:16    Imaging No results found. STUDIES:  CT Abd Pelvis 05/10>>Open ventral lower abdominal wall wound and broad-based left lower quadrant abdominal wall hernia at the deep margin of the open wound measuring approximately 13 x 15 cm (ML by CC). Through the herniation extending into the open wound are loops of small and large bowel which demonstrate wall thickening and surrounding inflammatory changes. There is proximal mild small bowel obstruction. Fat stranding in the right lower quadrant  appendectomy bed may represent phlegmonous or postsurgical changes. No peritoneal abscess identified. Left adrenal hypodense nodule measures 8 Hounsfield units, probably an adenoma or scarring. 4-5 mm pulmonary nodules along right major fissure. No follow-up needed if patient is low-risk (and has no known or suspected primary neoplasm). Non-contrast chest CT can be considered in 12 months if patient is high-risk. This recommendation follows the consensus statement: Guidelines for Management of Incidental Pulmonary Nodules Detected on CT Images: From the Fleischner Society 2017; Radiology 2017; 562:130-865    --Marda Stalker, MD.  ICU Pager: [phone] [facility] Office Number: [phone]   11/14/2016   Critical Care Attestation.  I have personally  obtained a history, examined the patient, evaluated laboratory and imaging results, formulated the assessment and plan and placed orders. The Patient requires high complexity decision making for assessment and support, frequent evaluation and titration of therapies, application of advanced monitoring technologies and extensive interpretation of multiple databases. The patient has critical illness that could lead imminently to failure of 1 or more organ systems and requires the highest level of physician preparedness to intervene.  Critical Care Time devoted to patient care services described in this note is 35 minutes and is exclusive of time spent in procedures.

## 2016-11-14 NOTE — Progress Notes (Signed)
Report received, pt care assumed.

## 2016-11-14 NOTE — Transfer of Care (Signed)
Immediate Anesthesia Transfer of Care Note  Patient: Peter [name]  Procedure(s) Performed: Procedure(s): EXPLORATORY LAPAROTOMY; Irrigation, partial closure (N/A)  Patient Location: ICU  Anesthesia Type:General  Level of Consciousness: sedated  Airway & Oxygen Therapy: Patient remains intubated per anesthesia plan and Patient placed on Ventilator (see vital sign flow sheet for setting)  Post-op Assessment: Report given to RN and Post -op Vital signs reviewed and stable  Post vital signs: Reviewed and stable  Last Vitals:  Vitals:   11/14/16 1100 11/14/16 1315  BP: (!) 112/55 126/71  Pulse: 75   Resp: (!) 24 (!) 24  Temp:  37 C    Last Pain:  Vitals:   11/14/16 1315  TempSrc:   PainSc: Asleep         Complications: No apparent anesthesia complications

## 2016-11-14 NOTE — Progress Notes (Signed)
eLink Physician-Brief Progress Note Patient Name: Peter [name] DOB: 1960/09/30 MRN: [id number]   Date of Service  11/14/2016  HPI/Events of Note  Hypokalemia and hypomag  eICU Interventions  Potassium and mag replaced     Intervention Category Intermediate Interventions: Electrolyte abnormality - evaluation and management  Antonieta Slaven 11/14/2016, 4:43 AM

## 2016-11-14 NOTE — Anesthesia Post-op Follow-up Note (Cosign Needed)
Anesthesia QCDR form completed.        

## 2016-11-14 NOTE — Progress Notes (Signed)
Pt to OR at this time for ex lap.

## 2016-11-14 NOTE — Progress Notes (Signed)
Pharmacy Consult for Electrolyte Monitoring Indication: Hypokalemia/hypomagnesemia  Allergies  Allergen Reactions  . Isosorbide     Headache, nausea, vomiting    Patient Measurements: Height: 6' (182.9 cm) Weight: (!) 312 lb 9.8 oz (141.8 kg) IBW/kg (Calculated) : 77.6  Vital Signs: Temp: 98.7 F (37.1 C) (05/13 0400) Temp Source: Oral (05/13 0400) BP: 97/69 (05/13 0600) Pulse Rate: 74 (05/13 0600) Intake/Output from previous day: 05/12 0701 - 05/13 0700 In: 10514.2 [I.V.:10114.2; IV Piggyback:400] Out: 1610 [Urine:1450; Emesis/NG output:750; Drains:1450] Intake/Output from this shift: No intake/output data recorded.  Labs:  Recent Labs  11/11/16 1529 11/12/16 0431 11/13/16 0434 11/13/16 0539 11/14/16 0331  WBC 38.6* 21.2* 28.1*  --  19.1*  HGB 13.0 10.8* 11.1*  --  9.7*  HCT 39.6* 31.5* 32.5*  --  28.4*  PLT 452* 427 480*  --  425  CREATININE 1.12 1.68*  --  1.53* 1.38*  MG 1.6* 2.0  --   --  1.6*  PHOS  --  6.4*  --   --   --   ALBUMIN 2.3* 2.2*  --   --   --   PROT 5.9* 5.3*  --   --   --   AST 41 32  --   --   --   ALT 33 24  --   --   --   ALKPHOS 85 64  --   --   --   BILITOT 1.1 0.8  --   --   --    Estimated Creatinine Clearance: 85.3 mL/min (A) (by C-G formula based on SCr of 1.38 mg/dL (H)).  Potassium (mmol/L)  Date Value  11/14/2016 3.3 (L)   Phosphorus (mg/dL)  Date Value  11/12/2016 6.4 (H)   Calcium (mg/dL)  Date Value  11/14/2016 7.0 (L)   Albumin Corrected Calcium= 8.4 mg/dl  Assessment: 56 y/o M with ex lap appendectomy 4/28 admitted to ICU due to evisceration. Patient is currently ventilated and sedated.   Plan:  K=3.3, Mag=1.6.  MD orderedPotassium Chloride 10 meq IV x 6 for a total of 60 meq and Magnesium 2 gm IV x 1. WIll add Magnesium 2 gm IV x 1 at 1400. Will f/u AM labs.   Chinita Greenland PharmD Clinical Pharmacist 11/14/2016

## 2016-11-14 NOTE — Op Note (Signed)
Pre-operative Diagnosis: Open abdomen  Post-operative Diagnosis: Same  Procedure performed: Exploratory laparotomy, placement of gastrojejunal tube, partial closure of fascia, replacement of negative pressure dressing  Surgeon: Clayburn Pert   Assistants: None  Anesthesia: General endotracheal anesthesia  ASA Class: 3  Surgeon: Clayburn Pert, MD FACS  Anesthesia: Gen. with endotracheal tube  Assistant: None  Procedure Details  The patient was seen again in the ICU. The benefits, complications, treatment options, and expected outcomes were discussed with the patient's wife. The risks of bleeding, infection, recurrence of symptoms, failure to resolve symptoms,  bowel injury, any of which could require further surgery were reviewed with the patient.   The patient was taken to Operating Room, identified as Peter [name] and the procedure verified.  A Time Out was held and the above information confirmed.  Patient was brought to the operating room from the ICU intubated and under general anesthesia, antibiotic prophylaxis was confirmed. VTE prophylaxis was in place. The previously placed wound VAC was removed and the abdomen was prepped with iodine and draped in the sterile fashion. The patient was positioned in the supine position.  The abdomen was fully inspected. There were some flimsy adhesions again noted that were able to be easily lysed with digital dissection. There is no evidence of necrotic tissue to either the bowel or the soft tissue. There is no evidence of leakage of bowel contents to the abdominal cavity.  At this point the decision was made to attempt a partial fascial closure and to place a gastrojejunal tube for feeding and decompression. The stomach was identified and brought to the anterior abdominal wall. An area to the left upper quadrant of the abdomen was identified and an incision was made. It was fully taken down into the peritoneal cavity. An 66 French  Moss gastrojejunal tube was brought to the field and placed through this opening. A gastrotomy was made and bluntly entered into with a Kelly forceps. This allowed the immediate return of bile. The Moss tube was placed into the gastrotomy and manually fed distally into the stomach towards the small bowel. Due to the patient's body habitus it was not able to be palpated within the small bowel. The stomach portion of the tube was inflated with 20 mL of water and secured to the anterior abdominal wall. The tube was then secured with 2 3-0 nylon sutures.  We then turned our attention to the anterior fascia of the most cephalad aspect of the incision. With Coker graspers it was able to be pulled towards itself medially. Using interrupted #1 PDS sutures the fascia was able to be closed for approximately 8 cm of the incision. We then copiously irrigated the remaining abdomen. An additional abscess or wound VAC system was brought to the field and cut to proper size and placed into the peritoneal cavity. The provided football sponge was made to the appropriate size covering the wound and into our left lower quadrant opening. It was secured circumferentially with the provided adhesive and benzoin. A good seal was obtained with the wound VAC device prior to the end of the procedure.  Patient tolerated procedure well. He was transferred back to his ICU bed and returned to the ICU intubated and sedated. There were no immediate complications and all of our counts were correct at the end of the procedure.  Findings: Open abdomen   Estimated Blood Loss: 30 mL         Drains: Gastrojejunal tube  Specimens: None          Complications: None                  Condition: Good   Clayburn Pert, MD, FACS

## 2016-11-14 NOTE — Progress Notes (Signed)
Nutrition Follow-up  DOCUMENTATION CODES:   Morbid obesity  INTERVENTION:  1. Monitor for clearance to use G-J tube, propofol was at 46.4 --> 1225 calories while in surgery.  NUTRITION DIAGNOSIS:   Inadequate oral intake related to inability to eat as evidenced by NPO status. -ongoing  GOAL:   Provide needs based on ASPEN/SCCM guidelines -not meeting yet  MONITOR:   I & O's, Labs, Skin, Vent status  REASON FOR ASSESSMENT:   Ventilator    ASSESSMENT:   Peter [name] is a 56 y.o. male who had a laparotomy and appendectomy 4/28 returns now with eviscerated bowel s/p ex lap, abd wash out, abd wall debridement  Patient is currently intubated on ventilator support MV: 11.9 L/min Temp (24hrs), Avg:99 F (37.2 C), Min:98.5 F (36.9 C), Max:100.1 F (37.8 C) Propofol: 46.4 ml/hr --> 1225 calories S/P ex lap, placement of G-J Tube, partial close of fascia, replacement of wound vac  Labs and medications reviewed: Na 133, K 3.3, Mg 1.6, No Phosphorus today CBGs 113-115 Fentanyl gtt, LR @ 166mL/hr, Levo gtt  Diet Order:  Diet NPO time specified  Skin:  Reviewed, no issues  Last BM:  11/04/2016  Height:   Ht Readings from Last 1 Encounters:  11/11/16 6' (1.829 m)    Weight:   Wt Readings from Last 1 Encounters:  11/11/16 (!) 312 lb 9.8 oz (141.8 kg)    Ideal Body Weight:  67.27 kg  BMI:  Body mass index is 42.4 kg/m.  Estimated Nutritional Needs:   Kcal:  6808-8110 calories  Protein:  >/= 168 gm  Fluid:  Per MD/NP/PA  EDUCATION NEEDS:   Education needs no appropriate at this time  Satira Anis. Rodrigo Mcgranahan, MS, RD LDN Inpatient Clinical Dietitian Pager [phone]

## 2016-11-14 NOTE — Progress Notes (Signed)
PT had two episodes of Desaturation, pt appears to be biting the ET tube.  Pt given increased breaths at 100% and bolus of Fentanyl from gtt.  Pt desated to 20% with corolating pulse and good waveform.  Resolved within 1 minute.  Passed on to night shift nurse Misty, to report to Night time NP.

## 2016-11-15 ENCOUNTER — Inpatient Hospital Stay: Payer: 59

## 2016-11-15 ENCOUNTER — Encounter: Payer: Self-pay | Admitting: General Surgery

## 2016-11-15 LAB — MAGNESIUM: MAGNESIUM: 1.8 mg/dL (ref 1.7–2.4)

## 2016-11-15 LAB — CBC
HEMATOCRIT: 30 % — AB (ref 40.0–52.0)
HEMOGLOBIN: 10.1 g/dL — AB (ref 13.0–18.0)
MCH: 29.2 pg (ref 26.0–34.0)
MCHC: 33.7 g/dL (ref 32.0–36.0)
MCV: 86.8 fL (ref 80.0–100.0)
Platelets: 420 10*3/uL (ref 150–440)
RBC: 3.46 MIL/uL — ABNORMAL LOW (ref 4.40–5.90)
RDW: 13.4 % (ref 11.5–14.5)
WBC: 18.1 10*3/uL — AB (ref 3.8–10.6)

## 2016-11-15 LAB — BASIC METABOLIC PANEL
Anion gap: 5 (ref 5–15)
BUN: 14 mg/dL (ref 6–20)
CHLORIDE: 102 mmol/L (ref 101–111)
CO2: 26 mmol/L (ref 22–32)
CREATININE: 1.01 mg/dL (ref 0.61–1.24)
Calcium: 7.2 mg/dL — ABNORMAL LOW (ref 8.9–10.3)
GFR calc non Af Amer: >60 mL/min (ref >60)
Glucose, Bld: 106 mg/dL — ABNORMAL HIGH (ref 65–99)
POTASSIUM: 3.7 mmol/L (ref 3.5–5.1)
Sodium: 133 mmol/L — ABNORMAL LOW (ref 135–145)

## 2016-11-15 LAB — GLUCOSE, CAPILLARY
GLUCOSE-CAPILLARY: 113 mg/dL — AB (ref 65–99)
GLUCOSE-CAPILLARY: 114 mg/dL — AB (ref 65–99)
GLUCOSE-CAPILLARY: 82 mg/dL (ref 65–99)
GLUCOSE-CAPILLARY: 88 mg/dL (ref 65–99)
Glucose-Capillary: 100 mg/dL — ABNORMAL HIGH (ref 65–99)
Glucose-Capillary: 94 mg/dL (ref 65–99)

## 2016-11-15 MED ORDER — IOPAMIDOL (ISOVUE-300) INJECTION 61%
50.0000 mL | Freq: Once | INTRAVENOUS | Status: AC | PRN
  Administered 2016-11-15: 50 mL

## 2016-11-15 NOTE — Progress Notes (Signed)
Pharmacy Consult for Electrolyte Monitoring Indication: Hypokalemia/hypomagnesemia  Allergies  Allergen Reactions  . Isosorbide     Headache, nausea, vomiting    Patient Measurements: Height: 6' (182.9 cm) Weight: (!) 312 lb 9.8 oz (141.8 kg) IBW/kg (Calculated) : 77.6  Vital Signs: Temp: 99 F (37.2 C) (05/14 0830) Temp Source: Oral (05/14 0830) BP: 112/63 (05/14 1100) Pulse Rate: 100 (05/14 1100) Intake/Output from previous day: 05/13 0701 - 05/14 0700 In: 7853.7 [I.V.:7853.7] Out: 4623 [Urine:2565; Emesis/NG output:150; Drains:1528; Blood:30] Intake/Output from this shift: Total I/O In: 1240 [I.V.:1240] Out: 125 [Urine:125]  Labs:  Recent Labs  11/13/16 0434 11/13/16 0539 11/14/16 0331 11/15/16 0427  WBC 28.1*  --  19.1* 18.1*  HGB 11.1*  --  9.7* 10.1*  HCT 32.5*  --  28.4* 30.0*  PLT 480*  --  425 420  CREATININE  --  1.53* 1.38* 1.01  MG  --   --  1.6* 1.8   Estimated Creatinine Clearance: 116.5 mL/min (by C-G formula based on SCr of 1.01 mg/dL).  Potassium (mmol/L)  Date Value  11/15/2016 3.7   Phosphorus (mg/dL)  Date Value  11/12/2016 6.4 (H)   Calcium (mg/dL)  Date Value  11/15/2016 7.2 (L)   Albumin Corrected Calcium= 8.6 mg/dl  Assessment: 56 y/o M with ex lap appendectomy 4/28 admitted to ICU due to evisceration. Patient is currently ventilated and sedated.   Plan:  Electrolytes are WNL. Will f/u AM labs.   Ulice Dash, PharmD Clinical Pharmacist  11/15/2016

## 2016-11-15 NOTE — Progress Notes (Signed)
1 Day Post-Op  Subjective: Status post acute appendicitis with appendectomy that resulted in an open procedure followed by necrotizing fasciitis with fascial loss. Her graft a she is currently in the ICU on a ventilator but is off the pressors at this time. Tolerating that well and is mildly sedated with propofol.  Objective: Vital signs in last 24 hours: Temp:  [98.6 F (37 C)-100.5 F (38.1 C)] 98.6 F (37 C) (05/14 0400) Pulse Rate:  [73-95] 85 (05/14 0800) Resp:  [21-24] 24 (05/14 0800) BP: (91-129)/(47-71) 91/55 (05/14 0800) SpO2:  [92 %-100 %] 95 % (05/14 0800) FiO2 (%):  [30 %-70 %] 35 % (05/14 0750) Last BM Date:  (PTA)  Intake/Output from previous day: 05/13 0701 - 05/14 0700 In: 7853.7 [I.V.:7853.7] Out: 4623 [Urine:2565; Emesis/NG output:150; Drains:1528; Blood:30] Intake/Output this shift: No intake/output data recorded.  Physical exam:  Open eyes. Abdomen is soft distended morbidly obese wound VAC is in place J G-tube is in place.  Lab Results: CBC   Recent Labs  11/14/16 0331 11/15/16 0427  WBC 19.1* 18.1*  HGB 9.7* 10.1*  HCT 28.4* 30.0*  PLT 425 420   BMET  Recent Labs  11/14/16 0331 11/15/16 0427  NA 133* 133*  K 3.3* 3.7  CL 99* 102  CO2 26 26  GLUCOSE 128* 106*  BUN 20 14  CREATININE 1.38* 1.01  CALCIUM 7.0* 7.2*   PT/INR No results for input(s): LABPROT, INR in the last 72 hours. ABG  Recent Labs  11/14/16 0335  PHART 7.43  HCO3 27.2    Studies/Results: Dg Chest 1 View  Result Date: 11/15/2016 CLINICAL DATA:  Shortness of breath.  Intubation. EXAM: CHEST 1 VIEW COMPARISON:  11/14/2016. FINDINGS: Endotracheal tube, left IJ line stable position. Heart size normal. Low lung volumes with persistent bibasilar atelectasis/infiltrates. No significant change from prior exam. No pneumothorax. IMPRESSION: 1. Endotracheal tube and left IJ line stable position. 2. Low lung volumes with persistent bibasilar atelectasis/ infiltrates. No  interim change. Electronically Signed   By: Marcello Moores  Register   On: 11/15/2016 06:31   Dg Chest 1 View  Result Date: 11/14/2016 CLINICAL DATA:  Routine chest x-ray.  Shortness of breath. EXAM: CHEST 1 VIEW COMPARISON:  Nov 11, 2016 FINDINGS: The ETT is in good position. The NG tube terminates below today's film. No pneumothorax. Increased opacity in medial right lung base. Increased opacity in the left retrocardiac region. No other interval changes. IMPRESSION: 1. The ETT is in good position. 2. Increased opacity in the medial right lung base could represent atelectasis or developing infiltrate. 3. Increasing opacity in the left retrocardiac region could also represent atelectasis or infiltrate. Recommend follow-up to resolution. Electronically Signed   By: Dorise Bullion III M.D   On: 11/14/2016 07:16   Dg Abd Portable 1v  Result Date: 11/15/2016 CLINICAL DATA:  Open abdominal incision with drainage. EXAM: PORTABLE ABDOMEN - 1 VIEW COMPARISON:  11/11/2016.  CT 11/11/2016. FINDINGS: Interim removal of NG tube. Surgical clips right upper quadrant. Soft tissue structures are unremarkable. Interim improvement of bowel distention. No free air. Degenerative changes lumbar spine and both hips. Pelvic calcifications consistent phleboliths. Basilar subsegmental atelectasis. IMPRESSION: Interim removal of NG tube. Interim improvement of bowel distention. No free air. Electronically Signed   By: Marcello Moores  Register   On: 11/15/2016 06:35    Anti-infectives: Anti-infectives    Start     Dose/Rate Route Frequency Ordered Stop   11/11/16 1600  piperacillin-tazobactam (ZOSYN) IVPB 4.5 g  Status:  Discontinued     4.5 g 200 mL/hr over 30 Minutes Intravenous Every 8 hours 11/11/16 0917 11/11/16 1320   11/11/16 1500  piperacillin-tazobactam (ZOSYN) IVPB 3.375 g  Status:  Discontinued     3.375 g 12.5 mL/hr over 240 Minutes Intravenous Every 8 hours 11/11/16 0705 11/11/16 1320   11/11/16 1400  piperacillin-tazobactam  (ZOSYN) IVPB 4.5 g     4.5 g 25 mL/hr over 240 Minutes Intravenous Every 8 hours 11/11/16 1320     11/11/16 0733  piperacillin-tazobactam (ZOSYN) 3.375 GM/50ML IVPB    Comments:  Veda Canning: cabinet override      11/11/16 0733 11/11/16 0743   11/11/16 0715  piperacillin-tazobactam (ZOSYN) IVPB 4.5 g     4.5 g 200 mL/hr over 30 Minutes Intravenous  Once 11/11/16 0705     11/11/16 0700  piperacillin-tazobactam (ZOSYN) IVPB 3.375 g  Status:  Discontinued     3.375 g 100 mL/hr over 30 Minutes Intravenous Every 6 hours 11/11/16 7619 11/11/16 0705      Assessment/Plan: s/p Procedure(s): EXPLORATORY LAPAROTOMY; Irrigation, partial closure   Nutrition is necessary in this patient. I will review the films to determine if the J-tube's portion of the Moss tube is in the duodenum. If that is the case with start enteric feeds otherwise would require TPN or a trial of enteric feeds although G-tube output has been slightly high. Will required operative wound VAC change in the OR probably tomorrow.Florene Glen, MD, FACS  11/15/2016

## 2016-11-15 NOTE — Progress Notes (Signed)
Nutrition Follow-up  DOCUMENTATION CODES:   Morbid obesity  INTERVENTION:   RD will order tube feeds pending confirmation of PEG-J placement as pt on high rate of propofol.   NUTRITION DIAGNOSIS:   Inadequate oral intake related to inability to eat as evidenced by NPO status. Continues  GOAL:   Provide needs based on ASPEN/SCCM guidelines  MONITOR:   I & O's, Labs, Skin, Vent status, TF initiation   ASSESSMENT:   Peter [name] is a 56 y.o. male who had a laparotomy and appendectomy 4/28 returns now with eviscerated bowel s/p ex lap, abd wash out, abd wall debridement   Pt s/p Exploratory laparotomy, placement of gastrojejunal tube, partial closure of fascia, replacement of negative pressure dressing 5/13. Per surgery note, MD unable to visualize PEG-J tube on radiographs as pt is morbidly obese. Plan is for pt to get a contrast study to determine J-tube location. RD will order tube feeds once tube placement confirmed as pt on high rate of propofol which provides 1336 kcal/day of energy. Pt scheduled to have wound vac changed 5/15. Possible TPN if unable to confirm tube placement.   Patient is currently intubated on ventilator support MV: 12.4 L/min Temp (24hrs), Avg:99.3 F (37.4 C), Min:98.6 F (37 C), Max:100.5 F (38.1 C)  Propofol: 50.6 ml/hr  Medications reviewed and include: lovenox, insulin, protonix, zosyn, propofol, fentanyl, midazolam  Labs reviewed: Na 133(L), Ca 7.2(L) Triglycerides 282(H)- 5/13 Wbc- 18.1(H) cbgs- 155, 128, 106 x 48hrs  Diet Order:  Diet NPO time specified  Skin:  Reviewed, no issues  Last BM:  11/04/2016  Height:   Ht Readings from Last 1 Encounters:  11/11/16 6' (1.829 m)    Weight:   Wt Readings from Last 1 Encounters:  11/11/16 (!) 312 lb 9.8 oz (141.8 kg)    Ideal Body Weight:  67.27 kg  BMI:  Body mass index is 42.4 kg/m.  Estimated Nutritional Needs:   Kcal:  1027-2536 calories  Protein:  >/= 168 gm  Fluid:   Per MD/NP/PA  EDUCATION NEEDS:   Education needs no appropriate at this time  Koleen Distance MS, RD, [place] Pager #- [phone]

## 2016-11-15 NOTE — Care Management (Signed)
Concern that JG tube is not in correct position.  Will be evaluated under contrast to determine.  consideration for TPN. Off levo and follows commands.  Back to OR 5/15.

## 2016-11-15 NOTE — Progress Notes (Signed)
Addendum: I personally reviewed the chest x-ray as well as the abdominal films. I cannot see the J-tube nor the G-tube in place. This is likely secondary to technique and the patient's morbid obesity. Will place an order for a contrast study to determine the site of J-tube location. This should be done prior to's instituting enteric feeds and could preclude the need for TPN per

## 2016-11-15 NOTE — Anesthesia Postprocedure Evaluation (Signed)
Anesthesia Post Note  Patient: Peter [name]  Procedure(s) Performed: Procedure(s) (LRB): EXPLORATORY LAPAROTOMY; Irrigation, partial closure (N/A)  Patient location during evaluation: ICU Anesthesia Type: General Level of consciousness: sedated Pain management: pain level controlled Vital Signs Assessment: post-procedure vital signs reviewed and stable Respiratory status: patient remains intubated per anesthesia plan and patient on ventilator - see flowsheet for VS Cardiovascular status: stable Anesthetic complications: no     Last Vitals:  Vitals:   11/15/16 0500 11/15/16 0600  BP: 97/60 (!) 106/59  Pulse: 91 87  Resp: (!) 24 (!) 24  Temp:      Last Pain:  Vitals:   11/15/16 0400  TempSrc: Axillary  PainSc:                  Blima Singer

## 2016-11-15 NOTE — Progress Notes (Signed)
[facility] Progess Note    SYNOPSIS   This is a 56 year old morbidly obese male with a history of open appendectomy on 4/28. Readmitted with evisceration with necrotizing fasciitis with septic shock. Now status post laparotomy, debridement, multiple washouts. Overall, prognosis is guarded.   ASSESSMENT/PLAN   PULMONARY A: Blood gas: 7.43/41/149/27.2; compensated respiratory failure. Chest x-ray images personally reviewed, 11/14/16; increased right middle lobe infiltrate, possible pneumonia versus atelectasis. P:   -Continue current vent settings. -No plan for extubation as the patient is  to go back to the OR. -New infiltrate, no other evidence to indicate pneumonia, we'll check sputum culture.  VENTILATOR SETTINGS: Vent Mode: PRVC FiO2 (%):  [30 %-70 %] 35 % Set Rate:  [24 bmp] 24 bmp Vt Set:  [500 mL] 500 mL PEEP:  [5 cmH20] 5 cmH20  CARDIOVASCULAR A: Continued septic shock with hypotension. -Currently on Levophed at 13 mics. P:  Levofloxacin started  HEMODYNAMICS: CVP:  [5 mmHg-11 mmHg] 7 mmHg  RENAL A:  Acute kidney injury. Creatinine 1.38. Today, down from 1.53. Multiple electrolyte abnormalities, replaced. P:   Continue gentle IV fluids.  INTAKE / OUTPUT:  Intake/Output Summary (Last 24 hours) at 11/15/16 0748 Last data filed at 11/15/16 0600  Gross per 24 hour  Intake          7853.74 ml  Output             4623 ml  Net          3230.74 ml    GASTROINTESTINAL A:   P:   Continue protonix.   HEMATOLOGIC A:  Leukocytosis, likely reactive to sepsis.  P:  Continue to monitor.   INFECTIOUS A:  Septic shock secondary to necrotizing fasciitis of the abdomen, status post washout. P:   Begin antibiotics, continue management per surgery.  Micro/culture results: MRSA PCR positive on 10, negative BCx2 5/10, negative Surgical wound culture 5/10: Klebsiella oxytoca. Sputum: Pending  Antibiotics: Zosyn  5/10>>  ENDOCRINE A:  Elevated blood glucose. P:   -Appears controlled with sliding scale insulin.  NEUROLOGIC A:  Metabolic encephalopathy, secondary to sepsis P:   RASS goal: -1   SIGNIFICANT EVENTS: 05/10-Pt admitted to ICU intubated post/op  LINES/TUBES: ETT 05/10>> Left IJ CVL 05/10>>  Best Practices  DVT Prophylaxis: Enoxaparin GI Prophylaxis: Protonix    ---------------------------------------   ----------------------------------------   Name: Peter [name] MRN: [id number] DOB: 12-07-1960    ADMISSION DATE:  11/11/2016    SUBJECTIVE:   Pt currently on the ventilator, can not provide history or review of systems.  Remains critically ill Prognosis is poor  Review of Systems:  Critically ill, ROS unobtainable  VITAL SIGNS: Temp:  [98.6 F (37 C)-100.5 F (38.1 C)] 98.6 F (37 C) (05/14 0400) Pulse Rate:  [72-95] 87 (05/14 0600) Resp:  [21-24] 24 (05/14 0600) BP: (94-129)/(47-71) 106/59 (05/14 0600) SpO2:  [92 %-100 %] 98 % (05/14 0600) FiO2 (%):  [30 %-70 %] 35 % (05/14 0506)    Physical Examination:   VS: BP (!) 106/59   Pulse 87   Temp 98.6 F (37 C) (Axillary)   Resp (!) 24   Ht 6' (1.829 m)   Wt (!) 312 lb 9.8 oz (141.8 kg)   SpO2 98%   BMI 42.40 kg/m   General Appearance: intubated on Vent Neuro:without focal findings, mental status reduced HEENT: PERRLA, EOM intact. Pulmonary: normal breath sounds .; Decreased air entry bilaterally.  CardiovascularNormal S1,S2.  No m/r/g.  Abdomen: Benign, Soft, non-tender. GCS<8T   LABORATORY PANEL:   CBC  Recent Labs Lab 11/15/16 0427  WBC 18.1*  HGB 10.1*  HCT 30.0*  PLT 420    Chemistries   Recent Labs Lab 11/12/16 0431  11/15/16 0427  NA 136  < > 133*  K 3.7  < > 3.7  CL 99*  < > 102  CO2 30  < > 26  GLUCOSE 137*  < > 106*  BUN 19  < > 14  CREATININE 1.68*  < > 1.01  CALCIUM 7.3*  < > 7.2*  MG 2.0  < > 1.8  PHOS 6.4*  --   --   AST 32  --   --   ALT 24   --   --   ALKPHOS 64  --   --   BILITOT 0.8  --   --   < > = values in this interval not displayed.   Recent Labs Lab 11/14/16 0748 11/14/16 1551 11/14/16 1936 11/14/16 2354 11/15/16 0405 11/15/16 0712  GLUCAP 115* 125* 122* 88 100* 113*    Recent Labs Lab 11/11/16 1251 11/14/16 0335  PHART 7.35 7.43  PCO2ART 50* 41  PO2ART 106 149*    Recent Labs Lab 11/11/16 0305 11/11/16 1529 11/12/16 0431  AST 53* 41 32  ALT 37 33 24  ALKPHOS 96 85 64  BILITOT 1.2 1.1 0.8  ALBUMIN 2.5* 2.3* 2.2*    Cardiac Enzymes  Recent Labs Lab 11/13/16 0434  TROPONINI <0.03    RADIOLOGY:  Dg Chest 1 View  Result Date: 11/15/2016 CLINICAL DATA:  Shortness of breath.  Intubation. EXAM: CHEST 1 VIEW COMPARISON:  11/14/2016. FINDINGS: Endotracheal tube, left IJ line stable position. Heart size normal. Low lung volumes with persistent bibasilar atelectasis/infiltrates. No significant change from prior exam. No pneumothorax. IMPRESSION: 1. Endotracheal tube and left IJ line stable position. 2. Low lung volumes with persistent bibasilar atelectasis/ infiltrates. No interim change. Electronically Signed   By: Marcello Moores  Register   On: 11/15/2016 06:31   Dg Chest 1 View  Result Date: 11/14/2016 CLINICAL DATA:  Routine chest x-ray.  Shortness of breath. EXAM: CHEST 1 VIEW COMPARISON:  Nov 11, 2016 FINDINGS: The ETT is in good position. The NG tube terminates below today's film. No pneumothorax. Increased opacity in medial right lung base. Increased opacity in the left retrocardiac region. No other interval changes. IMPRESSION: 1. The ETT is in good position. 2. Increased opacity in the medial right lung base could represent atelectasis or developing infiltrate. 3. Increasing opacity in the left retrocardiac region could also represent atelectasis or infiltrate. Recommend follow-up to resolution. Electronically Signed   By: Dorise Bullion III M.D   On: 11/14/2016 07:16   Dg Abd Portable 1v  Result  Date: 11/15/2016 CLINICAL DATA:  Open abdominal incision with drainage. EXAM: PORTABLE ABDOMEN - 1 VIEW COMPARISON:  11/11/2016.  CT 11/11/2016. FINDINGS: Interim removal of NG tube. Surgical clips right upper quadrant. Soft tissue structures are unremarkable. Interim improvement of bowel distention. No free air. Degenerative changes lumbar spine and both hips. Pelvic calcifications consistent phleboliths. Basilar subsegmental atelectasis. IMPRESSION: Interim removal of NG tube. Interim improvement of bowel distention. No free air. Electronically Signed   By: Marcello Moores  Register   On: 11/15/2016 06:35    Imaging No results found. STUDIES:  CT Abd Pelvis 05/10>>Open ventral lower abdominal wall wound and broad-based left lower quadrant abdominal wall hernia at the deep margin of the open wound measuring  approximately 13 x 15 cm (ML by CC). Through the herniation extending into the open wound are loops of small and large bowel which demonstrate wall thickening and surrounding inflammatory changes. There is proximal mild small bowel obstruction. Fat stranding in the right lower quadrant appendectomy bed may represent phlegmonous or postsurgical changes. No peritoneal abscess identified. Left adrenal hypodense nodule measures 8 Hounsfield units, probably an adenoma or scarring. 4-5 mm pulmonary nodules along right major fissure. No follow-up needed if patient is low-risk (and has no known or suspected primary neoplasm). Non-contrast chest CT can be considered in 12 months if patient is high-risk. This recommendation follows the consensus statement: Guidelines for Management of Incidental Pulmonary Nodules Detected on CT Images: From the Fleischner Society 2017; Radiology 2017; [phone]     Critical Care Time devoted to patient care services described in this note is 32 minutes.   Overall, patient is critically ill, prognosis is guarded.  Patient with Multiorgan failure and at high risk for cardiac arrest  and death.    Corrin Parker, M.D.  [facility]  Medical Director [place] Director [facility] Cardio-Pulmonary Department

## 2016-11-16 ENCOUNTER — Inpatient Hospital Stay: Payer: 59 | Admitting: Anesthesiology

## 2016-11-16 ENCOUNTER — Encounter
Admission: EM | Disposition: A | Discharge status: Other Acute Inpatient Hospital | Payer: Self-pay | Source: Home / Self Care | Attending: Surgery

## 2016-11-16 ENCOUNTER — Encounter: Payer: Self-pay | Admitting: Surgery

## 2016-11-16 HISTORY — PX: APPLICATION OF WOUND VAC: SHX5189

## 2016-11-16 LAB — BASIC METABOLIC PANEL
ANION GAP: 6 (ref 5–15)
BUN: 12 mg/dL (ref 6–20)
CALCIUM: 7.2 mg/dL — AB (ref 8.9–10.3)
CO2: 26 mmol/L (ref 22–32)
Chloride: 101 mmol/L (ref 101–111)
Creatinine, Ser: 1 mg/dL (ref 0.61–1.24)
GFR calc Af Amer: >60 mL/min (ref >60)
GLUCOSE: 116 mg/dL — AB (ref 65–99)
Potassium: 3.5 mmol/L (ref 3.5–5.1)
SODIUM: 133 mmol/L — AB (ref 135–145)

## 2016-11-16 LAB — COMPREHENSIVE METABOLIC PANEL
ALBUMIN: 1.4 g/dL — AB (ref 3.5–5.0)
ALT: 24 U/L (ref 17–63)
AST: 46 U/L — ABNORMAL HIGH (ref 15–41)
Alkaline Phosphatase: 63 U/L (ref 38–126)
Anion gap: 6 (ref 5–15)
BUN: 11 mg/dL (ref 6–20)
CHLORIDE: 101 mmol/L (ref 101–111)
CO2: 27 mmol/L (ref 22–32)
CREATININE: 0.74 mg/dL (ref 0.61–1.24)
Calcium: 7.3 mg/dL — ABNORMAL LOW (ref 8.9–10.3)
GFR calc non Af Amer: >60 mL/min (ref >60)
GLUCOSE: 105 mg/dL — AB (ref 65–99)
Potassium: 3.5 mmol/L (ref 3.5–5.1)
SODIUM: 134 mmol/L — AB (ref 135–145)
Total Bilirubin: 1.3 mg/dL — ABNORMAL HIGH (ref 0.3–1.2)
Total Protein: 4.5 g/dL — ABNORMAL LOW (ref 6.5–8.1)

## 2016-11-16 LAB — CULTURE, BLOOD (ROUTINE X 2)
CULTURE: NO GROWTH
Culture: NO GROWTH

## 2016-11-16 LAB — CULTURE, RESPIRATORY W GRAM STAIN

## 2016-11-16 LAB — GLUCOSE, CAPILLARY
GLUCOSE-CAPILLARY: 86 mg/dL (ref 65–99)
GLUCOSE-CAPILLARY: 101 mg/dL — AB (ref 65–99)
Glucose-Capillary: 115 mg/dL — ABNORMAL HIGH (ref 65–99)
Glucose-Capillary: 111 mg/dL — ABNORMAL HIGH (ref 65–99)
Glucose-Capillary: 105 mg/dL — ABNORMAL HIGH (ref 65–99)

## 2016-11-16 LAB — PHOSPHORUS
PHOSPHORUS: 2.1 mg/dL — AB (ref 2.5–4.6)
Phosphorus: 4 mg/dL (ref 2.5–4.6)
Phosphorus: 4.5 mg/dL (ref 2.5–4.6)

## 2016-11-16 LAB — MAGNESIUM
MAGNESIUM: 1.7 mg/dL (ref 1.7–2.4)
MAGNESIUM: 0.9 mg/dL — AB (ref 1.7–2.4)
MAGNESIUM: 2 mg/dL (ref 1.7–2.4)

## 2016-11-16 LAB — CULTURE, RESPIRATORY

## 2016-11-16 SURGERY — APPLICATION, WOUND VAC
Anesthesia: General | Wound class: Dirty or Infected

## 2016-11-16 MED ORDER — SODIUM CHLORIDE 0.9% FLUSH
10.0000 mL | Freq: Two times a day (BID) | INTRAVENOUS | Status: DC
  Administered 2016-11-16 – 2016-11-20 (×8): 10 mL

## 2016-11-16 MED ORDER — PRO-STAT SUGAR FREE PO LIQD
60.0000 mL | Freq: Three times a day (TID) | ORAL | Status: DC
  Administered 2016-11-16 – 2016-11-21 (×15): 60 mL

## 2016-11-16 MED ORDER — PRO-STAT SUGAR FREE PO LIQD: 30.0000 mL | Freq: Two times a day (BID) | ORAL | Status: DC

## 2016-11-16 MED ORDER — SODIUM CHLORIDE 0.9 % IJ SOLN
INTRAMUSCULAR | Status: AC
  Administered 2016-11-16: 10 mL
  Filled 2016-11-16: qty 10

## 2016-11-16 MED ORDER — LACTATED RINGERS IV SOLN
INTRAVENOUS | Status: DC
Start: 2016-11-16 — End: 2016-11-23
  Administered 2016-11-16 – 2016-11-23 (×8): via INTRAVENOUS

## 2016-11-16 MED ORDER — SODIUM CHLORIDE 0.9% FLUSH: 10.0000 mL | INTRAVENOUS | Status: DC | PRN

## 2016-11-16 MED ORDER — MAGNESIUM SULFATE 2 GM/50ML IV SOLN
2.0000 g | Freq: Once | INTRAVENOUS | Status: AC
  Administered 2016-11-16: 2 g via INTRAVENOUS
  Filled 2016-11-16: qty 50

## 2016-11-16 MED ORDER — POTASSIUM CHLORIDE 10 MEQ/50ML IV SOLN
10.0000 meq | INTRAVENOUS | Status: AC
  Administered 2016-11-16 (×2): 10 meq via INTRAVENOUS
  Filled 2016-11-16 (×2): qty 50

## 2016-11-16 MED ORDER — VITAL HIGH PROTEIN PO LIQD
1000.0000 mL | ORAL | Status: DC
  Administered 2016-11-16: 1000 mL
  Administered 2016-11-17 (×4)
  Administered 2016-11-17: 1000 mL
  Administered 2016-11-17 (×3)
  Administered 2016-11-17: 1000 mL
  Administered 2016-11-17 – 2016-11-18 (×4)
  Administered 2016-11-18: 1000 mL
  Administered 2016-11-19 (×7)
  Administered 2016-11-19 – 2016-11-20 (×2): 1000 mL
  Administered 2016-11-20 (×3)
  Administered 2016-11-21: 1000 mL

## 2016-11-16 SURGICAL SUPPLY — 24 items
CANISTER SUCT 1200ML W/VALVE (MISCELLANEOUS) IMPLANT
CATH TRAY 16F METER LATEX (MISCELLANEOUS) IMPLANT
DRAPE LAPAROTOMY 100X77 ABD (DRAPES) ×2 IMPLANT
ELECT CAUTERY NEEDLE TIP 1.0 (MISCELLANEOUS)
ELECT REM PT RETURN 9FT ADLT (ELECTROSURGICAL)
ELECTRODE CAUTERY NEDL TIP 1.0 (MISCELLANEOUS) IMPLANT
ELECTRODE REM PT RTRN 9FT ADLT (ELECTROSURGICAL) IMPLANT
GAUZE SPONGE 4X4 12PLY STRL (GAUZE/BANDAGES/DRESSINGS) IMPLANT
GLOVE BIO SURGEON STRL SZ8 (GLOVE) ×8 IMPLANT
GOWN STRL REUS W/ TWL LRG LVL3 (GOWN DISPOSABLE) ×2 IMPLANT
GOWN STRL REUS W/TWL LRG LVL3 (GOWN DISPOSABLE) ×2
KIT RM TURNOVER STRD PROC AR (KITS) IMPLANT
LABEL OR SOLS (LABEL) IMPLANT
NS IRRIG 1000ML POUR BTL (IV SOLUTION) ×2 IMPLANT
PACK BASIN MAJOR ARMC (MISCELLANEOUS) ×2 IMPLANT
PAD ABD DERMACEA PRESS 5X9 (GAUZE/BANDAGES/DRESSINGS) IMPLANT
SOL PREP PVP 2OZ (MISCELLANEOUS) ×2
SOLUTION PREP PVP 2OZ (MISCELLANEOUS) ×1 IMPLANT
SPONGE ABDOMINAL VAC ABTHERA (MISCELLANEOUS) ×2 IMPLANT
STAPLER SKIN PROX 35W (STAPLE) IMPLANT
SUT CHROMIC 0 CT 1 (SUTURE) IMPLANT
SUT CHROMIC BR 1/2CLE 2-0 54IN (SUTURE) IMPLANT
SUT MAXON ABS #0 GS21 30IN (SUTURE) IMPLANT
WND VAC CANISTER 500ML (MISCELLANEOUS) ×2 IMPLANT

## 2016-11-16 NOTE — Progress Notes (Signed)
   11/16/16 0153  Vitals  BP (!) 72/41  MAP (mmHg) (!) 51  Pulse Rate 78  ECG Heart Rate 79  Resp (!) 21  Oxygen Therapy  SpO2 100 %  End Tidal CO2 (EtCO2) 33  Invasive Hemodynamic Monitoring  CVP (mmHg) 3 mmHg    Levo 5 mcg initiated

## 2016-11-16 NOTE — Op Note (Signed)
11/11/2016 - 11/16/2016  2:37 PM  PATIENT:  Peter [name]  56 y.o. male  PRE-OPERATIVE DIAGNOSIS:  Wound sepsis  POST-OPERATIVE DIAGNOSIS:  Same  PROCEDURE: Examination under anesthesia, wound VAC change  SURGEON:  Florene Glen MD, FACS   ANESTHESIA:   Gen. with endotracheal tube   Details of Procedure: This a patient with an evisceration and a soft tissue necrotizing infection on the ventilator in the ICU requiring wound VAC change. I discussed with the patient's wife the rationale for performing this procedure and the risks of bleeding infection recurrent infection and the need for possible debridement etc. this is all reviewed for her the ICU she understood and agreed with this plan.  Findings no foul odor all viable tissue present minimal granulation tissue however no sign of continued wound sepsis.  Description of procedure patient was induced to general anesthesia while on the ventilator and capped in his ICU bed. A surgical post was performed and then the wound VAC existing was removed and the wound was inspected. There was no sign of further wound sepsis no foul odor no purulence but minimal granulation tissue noted.  With these findings and new wound VAC was placed utilizing the blue foam in a very similar fashion to Dr. Reginal Lutes procedure. An undermined area in the left lateral was packed as well with blue foam. The wound VAC was placed to suction and there was no sign of leakage.  Patient was taken back to the ICU in stable but critical condition. Sponge lap needle count was correct   Florene Glen, MD FACS

## 2016-11-16 NOTE — Progress Notes (Signed)
Nutrition Follow-up  DOCUMENTATION CODES:   Morbid obesity  INTERVENTION:  1. Recommend Vital High Protein goal rate of 24mL/hr, 54mL Pro-Stat TID when medically feasible. Begin @ 93mL/hr, increase by 10 every 12 hours to goal rate. With propofol @ 21.25mL provides 2117 calories, 174gm protein, and 803cc free water (108% estimated needs) 2. Closely monitor K, Phos, Mg, given no nutrition for at least 7 days, open abdomen, he is at high risk for refeeding with initiation of nutrition support. Replete as necessary  NUTRITION DIAGNOSIS:   Inadequate oral intake related to inability to eat as evidenced by NPO status. -ongoing  GOAL:   Provide needs based on ASPEN/SCCM guidelines -not meeting yet  MONITOR:   I & O's, Labs, Skin, Vent status  ASSESSMENT:   Peter [name] is a 56 y.o. male who had a laparotomy and appendectomy 4/28 returns now with eviscerated bowel s/p ex lap, abd wash out, abd wall debridement  Patient is currently intubated on ventilator support MV: 12 L/min Temp (24hrs), Avg:99.2 F (37.3 C), Min:99 F (37.2 C), Max:99.4 F (37.4 C) Propofol: 21.1 ml/hr --> 557 calories To return to OR today for another debridement, replacement of wound vac, surgery will ok nutrition afterwards if no complications. Labs and medications reviewed: Na 133 LR @ 148mL/hr Fentanyl gtt, Levo gtt  Diet Order:  Diet NPO time specified  Skin:  Reviewed, no issues  Last BM:  11/04/2016  Height:   Ht Readings from Last 1 Encounters:  11/11/16 6' (1.829 m)    Weight:   Wt Readings from Last 1 Encounters:  11/11/16 (!) 312 lb 9.8 oz (141.8 kg)    Ideal Body Weight:  67.27 kg  BMI:  Body mass index is 42.4 kg/m.  Estimated Nutritional Needs:   Kcal:  5361-4431 calories  Protein:  >/= 168 gm  Fluid:  Per MD/NP/PA  EDUCATION NEEDS:   Education needs no appropriate at this time  Satira Anis. Angeleigh Chiasson, MS, RD LDN Inpatient Clinical Dietitian Pager [phone]

## 2016-11-16 NOTE — Progress Notes (Signed)
2 Days Post-Op  Subjective: Patient is back on low-dose levo fed following debridement of an abdominal wound for necrotizing fasciitis after appendectomy. He remains on a ventilator and is stable.  Objective: Vital signs in last 24 hours: Temp:  [99 F (37.2 C)-99.4 F (37.4 C)] 99.3 F (37.4 C) (05/15 0153) Pulse Rate:  [78-104] 78 (05/15 0700) Resp:  [19-27] 24 (05/15 0700) BP: (72-125)/(41-71) 110/52 (05/15 0700) SpO2:  [90 %-100 %] 99 % (05/15 0700) FiO2 (%):  [35 %] 35 % (05/15 0545) Last BM Date:  (PTA)  Intake/Output from previous day: 05/14 0701 - 05/15 0700 In: 4383.9 [I.V.:4283.9; IV Piggyback:100] Out: 4270 [Urine:1235; Drains:2100] Intake/Output this shift: No intake/output data recorded.  Physical exam:  Morbidly obese male stable on a ventilator vital signs reviewed. Wound VAC in place.  Lab Results: CBC   Recent Labs  11/14/16 0331 11/15/16 0427  WBC 19.1* 18.1*  HGB 9.7* 10.1*  HCT 28.4* 30.0*  PLT 425 420   BMET  Recent Labs  11/15/16 0427 11/16/16 0316  NA 133* 133*  K 3.7 3.5  CL 102 101  CO2 26 26  GLUCOSE 106* 116*  BUN 14 12  CREATININE 1.01 1.00  CALCIUM 7.2* 7.2*   PT/INR No results for input(s): LABPROT, INR in the last 72 hours. ABG  Recent Labs  11/14/16 0335  PHART 7.43  HCO3 27.2    Studies/Results: Dg Chest 1 View  Result Date: 11/15/2016 CLINICAL DATA:  Shortness of breath.  Intubation. EXAM: CHEST 1 VIEW COMPARISON:  11/14/2016. FINDINGS: Endotracheal tube, left IJ line stable position. Heart size normal. Low lung volumes with persistent bibasilar atelectasis/infiltrates. No significant change from prior exam. No pneumothorax. IMPRESSION: 1. Endotracheal tube and left IJ line stable position. 2. Low lung volumes with persistent bibasilar atelectasis/ infiltrates. No interim change. Electronically Signed   By: Marcello Moores  Register   On: 11/15/2016 06:31   Dg Abd Portable 1v  Result Date: 11/15/2016 CLINICAL DATA:   Open abdominal incision with drainage. EXAM: PORTABLE ABDOMEN - 1 VIEW COMPARISON:  11/11/2016.  CT 11/11/2016. FINDINGS: Interim removal of NG tube. Surgical clips right upper quadrant. Soft tissue structures are unremarkable. Interim improvement of bowel distention. No free air. Degenerative changes lumbar spine and both hips. Pelvic calcifications consistent phleboliths. Basilar subsegmental atelectasis. IMPRESSION: Interim removal of NG tube. Interim improvement of bowel distention. No free air. Electronically Signed   By: Marcello Moores  Register   On: 11/15/2016 06:35   Dg Loyce Dys Tube Plc W/fl W/rad  Result Date: 11/15/2016 CLINICAL DATA:  Jejunal tube evaluation EXAM: NASO G TUBE PLACEMENT WITH FL AND WITH RAD CONTRAST:  80mL Isovue 300 FLUOROSCOPY TIME:  Fluoroscopy Time:  0.3 minutes Radiation Exposure Index (if provided by the fluoroscopic device): 8.3 mGy Number of Acquired Spot Images: 0 COMPARISON:  None. FINDINGS: The patient as a percutaneous enteric feeding tube with 1 limb extending into the stomach and a second terminating in the jejunum. The port labeled ' Feeding Tube' was hand injected with iodinated contrast with opacification of the duodenojejunal flexure. Patient tolerated the procedure well.  No immediate complications. IMPRESSION: The port labeled ' Feeding Tube' was hand injected with iodinated contrast with opacification of the duodenojejunal flexure confirming at the tip of the 'Feeding Tube' portion of the enteric tube is within the distal duodenum/proximal jejunum. Electronically Signed   By: Kathreen Devoid   On: 11/15/2016 16:01    Anti-infectives: Anti-infectives    Start     Dose/Rate Route  Frequency Ordered Stop   11/11/16 1600  piperacillin-tazobactam (ZOSYN) IVPB 4.5 g  Status:  Discontinued     4.5 g 200 mL/hr over 30 Minutes Intravenous Every 8 hours 11/11/16 0917 11/11/16 1320   11/11/16 1500  piperacillin-tazobactam (ZOSYN) IVPB 3.375 g  Status:  Discontinued     3.375  g 12.5 mL/hr over 240 Minutes Intravenous Every 8 hours 11/11/16 0705 11/11/16 1320   11/11/16 1400  piperacillin-tazobactam (ZOSYN) IVPB 4.5 g     4.5 g 25 mL/hr over 240 Minutes Intravenous Every 8 hours 11/11/16 1320     11/11/16 0733  piperacillin-tazobactam (ZOSYN) 3.375 GM/50ML IVPB    Comments:  Veda Canning: cabinet override      11/11/16 0733 11/11/16 0743   11/11/16 0715  piperacillin-tazobactam (ZOSYN) IVPB 4.5 g  Status:  Discontinued     4.5 g 200 mL/hr over 30 Minutes Intravenous  Once 11/11/16 0705 11/16/16 0133   11/11/16 0700  piperacillin-tazobactam (ZOSYN) IVPB 3.375 g  Status:  Discontinued     3.375 g 100 mL/hr over 30 Minutes Intravenous Every 6 hours 11/11/16 8329 11/11/16 0705      Assessment/Plan: s/p Procedure(s): EXPLORATORY LAPAROTOMY; Irrigation, partial closure   J-tube is in place and tube feeds will be started after a procedure this afternoon. I will plan on wound VAC change and possible debridement with examination under anesthesia this afternoon. No family was present to discuss consent issues at this time but I will discuss as needed.  Florene Glen, MD, FACS  11/16/2016

## 2016-11-16 NOTE — Anesthesia Preprocedure Evaluation (Signed)
Anesthesia Evaluation  Patient identified by MRN, date of birth, ID band Patient awake    Reviewed: Allergy & Precautions, H&P , NPO status , Patient's Chart, lab work & pertinent test results  History of Anesthesia Complications (+) DIFFICULT AIRWAY and history of anesthetic complications  Airway Mallampati: Intubated       Dental  (+) Poor Dentition, Chipped, Caps   Pulmonary shortness of breath, sleep apnea ,    Pulmonary exam normal        Cardiovascular hypertension, Normal cardiovascular exam  Pt currently on levophed for BP support   Neuro/Psych PSYCHIATRIC DISORDERS Depression negative neurological ROS     GI/Hepatic negative GI ROS, Neg liver ROS,   Endo/Other  negative endocrine ROS  Renal/GU      Musculoskeletal   Abdominal   Peds  Hematology negative hematology ROS (+)   Anesthesia Other Findings Intubated in ICU  Reproductive/Obstetrics negative OB ROS                             Anesthesia Physical  Anesthesia Plan  ASA: IV  Anesthesia Plan: General ETT   Post-op Pain Management:    Induction: Intravenous  Airway Management Planned: Oral ETT  Additional Equipment:   Intra-op Plan:   Post-operative Plan: Possible Post-op intubation/ventilation  Informed Consent: I have reviewed the patients History and Physical, chart, labs and discussed the procedure including the risks, benefits and alternatives for the proposed anesthesia with the patient or authorized representative who has indicated his/her understanding and acceptance.     Plan Discussed with: Anesthesiologist, CRNA and Surgeon  Anesthesia Plan Comments: (Spoke with wife, she understands and wishes to proceed.)        Anesthesia Quick Evaluation

## 2016-11-16 NOTE — Progress Notes (Signed)
   11/16/16 0100  Vitals  BP (!) 86/53  MAP (mmHg) (!) 63  Pulse Rate 88  ECG Heart Rate 88  Resp (!) 24  Oxygen Therapy  SpO2 100 %  End Tidal CO2 (EtCO2) 33  Invasive Hemodynamic Monitoring  CVP (mmHg) 9 mmHg

## 2016-11-16 NOTE — Anesthesia Post-op Follow-up Note (Cosign Needed)
Anesthesia QCDR form completed.        

## 2016-11-16 NOTE — Transfer of Care (Signed)
Immediate Anesthesia Transfer of Care Note  Patient: Peter [name]  Procedure(s) Performed: Procedure(s): APPLICATION OF WOUND VAC-ABDOMINAL (N/A)  Patient Location: PACU and ICU  Anesthesia Type:General  Level of Consciousness: sedated  Airway & Oxygen Therapy: Patient Spontanous Breathing and Patient remains intubated per anesthesia plan  Post-op Assessment: Report given to RN and Post -op Vital signs reviewed and stable  Post vital signs: Reviewed and stable  Last Vitals:  Vitals:   11/16/16 1200 11/16/16 1452  BP: (!) 143/67 (!) 159/73  Pulse: (!) 101 (!) 103  Resp: (!) 24 (!) 24  Temp: 37.6 C     Last Pain:  Vitals:   11/16/16 1200  TempSrc: Axillary  PainSc:          Complications: No apparent anesthesia complications

## 2016-11-16 NOTE — Progress Notes (Signed)
   11/16/16 0155  Vitals  BP (!) 94/51  MAP (mmHg) (!) 64  Pulse Rate 78  ECG Heart Rate 79  Resp (!) 24  Oxygen Therapy  SpO2 100 %  End Tidal CO2 (EtCO2) 33  Invasive Hemodynamic Monitoring  CVP (mmHg) 4 mmHg    Levo  6 mcg

## 2016-11-16 NOTE — Progress Notes (Signed)
Preoperative Review   Patient's wife is met in the ICU. The history is reviewed in the chart and with the patient. I personally reviewed the options and rationale as well as the risks of this procedure that have been previously discussed with the patient. All questions asked by the patient and/or family were answered to their satisfaction.  Patient agrees to proceed with this procedure at this time.  Florene Glen M.D. FACS

## 2016-11-16 NOTE — Progress Notes (Signed)
Pharmacy Consult for Electrolyte Monitoring Indication: Hypokalemia/hypomagnesemia  Allergies  Allergen Reactions  . Isosorbide     Headache, nausea, vomiting    Patient Measurements: Height: 6' (182.9 cm) Weight: (!) 312 lb 9.8 oz (141.8 kg) IBW/kg (Calculated) : 77.6  Vital Signs: Temp: 99.2 F (37.3 C) (05/15 0800) Temp Source: Axillary (05/15 0800) BP: 146/63 (05/15 0900) Pulse Rate: 90 (05/15 0900) Intake/Output from previous day: 05/14 0701 - 05/15 0700 In: 4383.9 [I.V.:4283.9; IV Piggyback:100] Out: 5830 [Urine:1235; Drains:2100] Intake/Output from this shift: Total I/O In: 2477.4 [I.V.:2477.4] Out: 320 [Urine:120; Drains:200]  Labs:  Recent Labs  11/14/16 0331 11/15/16 0427 11/16/16 0316  WBC 19.1* 18.1*  --   HGB 9.7* 10.1*  --   HCT 28.4* 30.0*  --   PLT 425 420  --   CREATININE 1.38* 1.01 1.00  MG 1.6* 1.8 1.7  PHOS  --   --  4.0   Estimated Creatinine Clearance: 117.6 mL/min (by C-G formula based on SCr of 1 mg/dL).  Potassium (mmol/L)  Date Value  11/16/2016 3.5   Phosphorus (mg/dL)  Date Value  11/16/2016 4.0   Calcium (mg/dL)  Date Value  11/16/2016 7.2 (L)   Albumin Corrected Calcium= 8.6 mg/dl  Assessment: 56 y/o M with ex lap appendectomy 4/28 admitted to ICU due to evisceration. Patient is currently ventilated and sedated.   5/15: Plan is to initiate feeds via J-tube after OR today. Patient with no nutrition for at least 7 days at high risk for refeeding syndrome.   Plan:  Will replace potassium and magnesium with KCl 20 meq IV and magnesium sulfate 2 g iv once and f/u AM labs.   Ulice Dash, PharmD Clinical Pharmacist   11/16/2016

## 2016-11-16 NOTE — Progress Notes (Signed)
   11/16/16 0205  Vitals  BP (!) 89/50  MAP (mmHg) (!) 62  Pulse Rate 80  ECG Heart Rate 80  Resp (!) 24  Oxygen Therapy  SpO2 100 %  End Tidal CO2 (EtCO2) 33  Invasive Hemodynamic Monitoring  CVP (mmHg) 3 mmHg     Levo 10 mcg

## 2016-11-16 NOTE — Anesthesia Postprocedure Evaluation (Signed)
Anesthesia Post Note  Patient: Peter [name]  Procedure(s) Performed: Procedure(s) (LRB): APPLICATION OF WOUND VAC-ABDOMINAL (N/A)  Patient location during evaluation: SICU Anesthesia Type: General Level of consciousness: sedated Pain management: pain level controlled Vital Signs Assessment: post-procedure vital signs reviewed and stable Respiratory status: patient remains intubated per anesthesia plan Cardiovascular status: stable Anesthetic complications: no     Last Vitals:  Vitals:   11/16/16 1200 11/16/16 1452  BP: (!) 143/67 (!) 159/73  Pulse: (!) 101 (!) 103  Resp: (!) 24 (!) 24  Temp: 37.6 C     Last Pain:  Vitals:   11/16/16 1200  TempSrc: Axillary  PainSc:                  KEPHART,WILLIAM K

## 2016-11-16 NOTE — Progress Notes (Signed)
[facility] Progess Note    SYNOPSIS   This is a 56 year old morbidly obese male with a history of open appendectomy on 4/28. Readmitted with evisceration with necrotizing fasciitis with septic shock. Now status post laparotomy, debridement, multiple washouts. Overall, prognosis is guarded.   ASSESSMENT/PLAN   PULMONARY A: Blood gas: 7.43/41/149/27.2; compensated respiratory failure. Chest x-ray images personally reviewed, 11/15/16; no significant change from previous. Vent settings as below P:   -Continue current vent settings. -No plan for extubation as the patient is  to go back to the OR. -New infiltrate, no other evidence to indicate pneumonia, we'll check sputum culture.  VENTILATOR SETTINGS: Vent Mode: PRVC FiO2 (%):  [30 %-70 %] 35 % Set Rate:  [24 bmp] 24 bmp Vt Set:  [500 mL] 500 mL PEEP:  [5 cmH20] 5 cmH20  CARDIOVASCULAR A: Continued septic shock with hypotension. -Currently on Levophed at 14 mics. P:  Wean down pressors as tolerated.   HEMODYNAMICS: CVP:  [5 mmHg-11 mmHg] 7 mmHg  RENAL A:  Acute kidney injury, doing better. Creatinine 1 Multiple electrolyte abnormalities, replaced. P:   Continue gentle IV fluids.  INTAKE / OUTPUT:  Intake/Output Summary (Last 24 hours) at 11/15/16 0748 Last data filed at 11/15/16 0600  Gross per 24 hour  Intake          7853.74 ml  Output             4623 ml  Net          3230.74 ml    GASTROINTESTINAL A:   P:   Continue protonix.  Feeds per G tube.   HEMATOLOGIC A:  Leukocytosis, likely reactive to sepsis.  P:  Continue to monitor.   INFECTIOUS A:  Septic shock secondary to necrotizing fasciitis of the abdomen, status post washout. P:   Continue antibiotics, continue management per surgery.  Micro/culture results: MRSA PCR positive on 10, negative BCx2 5/10, negative Surgical wound culture 5/10: Klebsiella oxytoca. Sputum: Pending  Antibiotics: Zosyn  5/10>>  ENDOCRINE A:  Elevated blood glucose. P:   -Appears controlled with sliding scale insulin.  NEUROLOGIC A:  Metabolic encephalopathy, secondary to sepsis P:   RASS goal: -1   SIGNIFICANT EVENTS: 05/10-Pt admitted to ICU intubated post/op  LINES/TUBES: ETT 05/10>> Left IJ CVL 05/10>>  Best Practices  DVT Prophylaxis: Enoxaparin GI Prophylaxis: Protonix    ---------------------------------------   ----------------------------------------   Name: Peter [name] MRN: [id number] DOB: 17-Nov-1960    ADMISSION DATE:  11/11/2016    SUBJECTIVE:   Pt currently on the ventilator, still can not provide history or review of systems.  Remains critically ill Prognosis is poor  Review of Systems:  Critically ill, ROS unobtainable  VITAL SIGNS: Temp:  [99 F (37.2 C)-99.4 F (37.4 C)] 99.3 F (37.4 C) (05/15 0153) Pulse Rate:  [78-104] 78 (05/15 0700) Resp:  [19-27] 24 (05/15 0700) BP: (72-125)/(41-71) 110/52 (05/15 0700) SpO2:  [90 %-100 %] 99 % (05/15 0700) FiO2 (%):  [35 %] 35 % (05/15 0545)    Physical Examination:   VS: BP (!) 110/52   Pulse 78   Temp 99.3 F (37.4 C) (Oral)   Resp (!) 24   Ht 6' (1.829 m)   Wt (!) 312 lb 9.8 oz (141.8 kg)   SpO2 99%   BMI 42.40 kg/m   General Appearance: intubated on Vent Neuro:without focal findings, mental status reduced HEENT: PERRLA, EOM intact. Pulmonary: normal breath sounds .; Decreased air entry bilaterally.  CardiovascularNormal S1,S2.  No m/r/g.   Abdomen: Benign, Soft, non-tender. GCS<8T   LABORATORY PANEL:   CBC  Recent Labs Lab 11/15/16 0427  WBC 18.1*  HGB 10.1*  HCT 30.0*  PLT 420    Chemistries   Recent Labs Lab 11/12/16 0431  11/16/16 0316  NA 136  < > 133*  K 3.7  < > 3.5  CL 99*  < > 101  CO2 30  < > 26  GLUCOSE 137*  < > 116*  BUN 19  < > 12  CREATININE 1.68*  < > 1.00  CALCIUM 7.3*  < > 7.2*  MG 2.0  < > 1.7  PHOS 6.4*  --  4.0  AST 32  --   --   ALT 24   --   --   ALKPHOS 64  --   --   BILITOT 0.8  --   --   < > = values in this interval not displayed.   Recent Labs Lab 11/15/16 0712 11/15/16 1124 11/15/16 1602 11/15/16 1938 11/15/16 2340 11/16/16 0439  GLUCAP 113* 114* 94 82 88 115*    Recent Labs Lab 11/11/16 1251 11/14/16 0335  PHART 7.35 7.43  PCO2ART 50* 41  PO2ART 106 149*    Recent Labs Lab 11/11/16 0305 11/11/16 1529 11/12/16 0431  AST 53* 41 32  ALT 37 33 24  ALKPHOS 96 85 64  BILITOT 1.2 1.1 0.8  ALBUMIN 2.5* 2.3* 2.2*    Cardiac Enzymes  Recent Labs Lab 11/13/16 0434  TROPONINI <0.03    RADIOLOGY:  Dg Chest 1 View  Result Date: 11/15/2016 CLINICAL DATA:  Shortness of breath.  Intubation. EXAM: CHEST 1 VIEW COMPARISON:  11/14/2016. FINDINGS: Endotracheal tube, left IJ line stable position. Heart size normal. Low lung volumes with persistent bibasilar atelectasis/infiltrates. No significant change from prior exam. No pneumothorax. IMPRESSION: 1. Endotracheal tube and left IJ line stable position. 2. Low lung volumes with persistent bibasilar atelectasis/ infiltrates. No interim change. Electronically Signed   By: Marcello Moores  Register   On: 11/15/2016 06:31   Dg Abd Portable 1v  Result Date: 11/15/2016 CLINICAL DATA:  Open abdominal incision with drainage. EXAM: PORTABLE ABDOMEN - 1 VIEW COMPARISON:  11/11/2016.  CT 11/11/2016. FINDINGS: Interim removal of NG tube. Surgical clips right upper quadrant. Soft tissue structures are unremarkable. Interim improvement of bowel distention. No free air. Degenerative changes lumbar spine and both hips. Pelvic calcifications consistent phleboliths. Basilar subsegmental atelectasis. IMPRESSION: Interim removal of NG tube. Interim improvement of bowel distention. No free air. Electronically Signed   By: Marcello Moores  Register   On: 11/15/2016 06:35   Dg Loyce Dys Tube Plc W/fl W/rad  Result Date: 11/15/2016 CLINICAL DATA:  Jejunal tube evaluation EXAM: NASO G TUBE PLACEMENT  WITH FL AND WITH RAD CONTRAST:  54mL Isovue 300 FLUOROSCOPY TIME:  Fluoroscopy Time:  0.3 minutes Radiation Exposure Index (if provided by the fluoroscopic device): 8.3 mGy Number of Acquired Spot Images: 0 COMPARISON:  None. FINDINGS: The patient as a percutaneous enteric feeding tube with 1 limb extending into the stomach and a second terminating in the jejunum. The port labeled ' Feeding Tube' was hand injected with iodinated contrast with opacification of the duodenojejunal flexure. Patient tolerated the procedure well.  No immediate complications. IMPRESSION: The port labeled ' Feeding Tube' was hand injected with iodinated contrast with opacification of the duodenojejunal flexure confirming at the tip of the 'Feeding Tube' portion of the enteric tube is within the distal duodenum/proximal  jejunum. Electronically Signed   By: Kathreen Devoid   On: 11/15/2016 16:01    Imaging No results found. STUDIES:  CT Abd Pelvis 05/10>>Open ventral lower abdominal wall wound and broad-based left lower quadrant abdominal wall hernia at the deep margin of the open wound measuring approximately 13 x 15 cm (ML by CC). Through the herniation extending into the open wound are loops of small and large bowel which demonstrate wall thickening and surrounding inflammatory changes. There is proximal mild small bowel obstruction. Fat stranding in the right lower quadrant appendectomy bed may represent phlegmonous or postsurgical changes. No peritoneal abscess identified. Left adrenal hypodense nodule measures 8 Hounsfield units, probably an adenoma or scarring. 4-5 mm pulmonary nodules along right major fissure. No follow-up needed if patient is low-risk (and has no known or suspected primary neoplasm). Non-contrast chest CT can be considered in 12 months if patient is high-risk. This recommendation follows the consensus statement: Guidelines for Management of Incidental Pulmonary Nodules Detected on CT Images: From the Fleischner  Society 2017; Radiology 2017; [phone]     Critical Care Time devoted to patient care services described in this note is 35 minutes.   Overall, patient is critically ill, prognosis is guarded.  Patient with Multiorgan failure and at high risk for cardiac arrest and death.    Marda Stalker, M.D.  [facility]

## 2016-11-16 NOTE — Progress Notes (Addendum)
Redraw for low phos and mag, critical value, question original result

## 2016-11-16 NOTE — Progress Notes (Signed)
Pt. Was transported to the OR.

## 2016-11-16 NOTE — Progress Notes (Signed)
   11/16/16 0100  Vitals  BP (!) 86/53  MAP (mmHg) (!) 63  Pulse Rate 88  ECG Heart Rate 88  Resp (!) 24  Oxygen Therapy  SpO2 100 %  End Tidal CO2 (EtCO2) 33  Invasive Hemodynamic Monitoring  CVP (mmHg) 5 mmHg     Levo requested from pharmacy

## 2016-11-16 NOTE — Anesthesia Procedure Notes (Signed)
Procedure Name: Intubation Date/Time: 11/16/2016 2:18 PM Performed by: Johnna Acosta Pre-anesthesia Checklist: Patient identified, Emergency Drugs available, Suction available, Patient being monitored and Timeout performed Patient Re-evaluated:Patient Re-evaluated prior to inductionOxygen Delivery Method: Circle system utilized Preoxygenation: Pre-oxygenation with 100% oxygen Intubation Type: Inhalational induction Comments: Arrived intubated with 7.5 ett

## 2016-11-17 DIAGNOSIS — T8131XA Disruption of external operation (surgical) wound, not elsewhere classified, initial encounter: Secondary | ICD-10-CM

## 2016-11-17 LAB — CBC
HCT: 26.1 % — ABNORMAL LOW (ref 40.0–52.0)
Hemoglobin: 9 g/dL — ABNORMAL LOW (ref 13.0–18.0)
MCH: 29.8 pg (ref 26.0–34.0)
MCHC: 34.3 g/dL (ref 32.0–36.0)
MCV: 87.1 fL (ref 80.0–100.0)
PLATELETS: 317 10*3/uL (ref 150–440)
RBC: 3 MIL/uL — AB (ref 4.40–5.90)
RDW: 13.5 % (ref 11.5–14.5)
WBC: 15 10*3/uL — AB (ref 3.8–10.6)

## 2016-11-17 LAB — BASIC METABOLIC PANEL
Anion gap: 5 (ref 5–15)
Anion gap: 4 — ABNORMAL LOW (ref 5–15)
BUN: 12 mg/dL (ref 6–20)
BUN: 14 mg/dL (ref 6–20)
CALCIUM: 7.1 mg/dL — AB (ref 8.9–10.3)
CALCIUM: 6.9 mg/dL — AB (ref 8.9–10.3)
CO2: 27 mmol/L (ref 22–32)
CO2: 26 mmol/L (ref 22–32)
CREATININE: 0.89 mg/dL (ref 0.61–1.24)
CREATININE: 0.78 mg/dL (ref 0.61–1.24)
Chloride: 104 mmol/L (ref 101–111)
Chloride: 104 mmol/L (ref 101–111)
GFR calc Af Amer: >60 mL/min (ref >60)
GFR calc non Af Amer: >60 mL/min (ref >60)
Glucose, Bld: 118 mg/dL — ABNORMAL HIGH (ref 65–99)
Glucose, Bld: 132 mg/dL — ABNORMAL HIGH (ref 65–99)
POTASSIUM: 3.1 mmol/L — AB (ref 3.5–5.1)
Potassium: 3.3 mmol/L — ABNORMAL LOW (ref 3.5–5.1)
SODIUM: 136 mmol/L (ref 135–145)
SODIUM: 134 mmol/L — AB (ref 135–145)

## 2016-11-17 LAB — PHOSPHORUS
PHOSPHORUS: 4.3 mg/dL (ref 2.5–4.6)
Phosphorus: 4.1 mg/dL (ref 2.5–4.6)

## 2016-11-17 LAB — GLUCOSE, CAPILLARY
GLUCOSE-CAPILLARY: 110 mg/dL — AB (ref 65–99)
GLUCOSE-CAPILLARY: 120 mg/dL — AB (ref 65–99)
GLUCOSE-CAPILLARY: 110 mg/dL — AB (ref 65–99)
GLUCOSE-CAPILLARY: 120 mg/dL — AB (ref 65–99)
Glucose-Capillary: 102 mg/dL — ABNORMAL HIGH (ref 65–99)
Glucose-Capillary: 107 mg/dL — ABNORMAL HIGH (ref 65–99)
Glucose-Capillary: 126 mg/dL — ABNORMAL HIGH (ref 65–99)

## 2016-11-17 LAB — MAGNESIUM
MAGNESIUM: 1.8 mg/dL (ref 1.7–2.4)
Magnesium: 1.7 mg/dL (ref 1.7–2.4)

## 2016-11-17 LAB — TRIGLYCERIDES: Triglycerides: 257 mg/dL — ABNORMAL HIGH (ref <150)

## 2016-11-17 LAB — POTASSIUM: Potassium: 3.7 mmol/L (ref 3.5–5.1)

## 2016-11-17 MED ORDER — SERTRALINE HCL 100 MG PO TABS
200.0000 mg | ORAL_TABLET | Freq: Every day | ORAL | Status: DC
  Administered 2016-11-17 – 2016-11-21 (×5): 200 mg via JEJUNOSTOMY
  Filled 2016-11-17 (×4): qty 2

## 2016-11-17 MED ORDER — POTASSIUM CHLORIDE 20 MEQ PO PACK
40.0000 meq | PACK | Freq: Once | ORAL | Status: AC
  Administered 2016-11-17: 40 meq via ORAL
  Filled 2016-11-17: qty 2

## 2016-11-17 MED ORDER — LAMOTRIGINE 100 MG PO TABS
200.0000 mg | ORAL_TABLET | Freq: Two times a day (BID) | ORAL | Status: DC
  Administered 2016-11-17 – 2016-11-22 (×11): 200 mg via JEJUNOSTOMY
  Filled 2016-11-17 (×11): qty 2

## 2016-11-17 MED ORDER — POTASSIUM CHLORIDE 10 MEQ/50ML IV SOLN
10.0000 meq | INTRAVENOUS | Status: AC
  Administered 2016-11-17 (×4): 10 meq via INTRAVENOUS
  Filled 2016-11-17 (×5): qty 50

## 2016-11-17 MED ORDER — SODIUM CHLORIDE 0.9 % IV BOLUS (SEPSIS)
500.0000 mL | Freq: Once | INTRAVENOUS | Status: AC
  Administered 2016-11-17: 500 mL via INTRAVENOUS

## 2016-11-17 MED ORDER — CARBAMAZEPINE 200 MG PO TABS
400.0000 mg | ORAL_TABLET | Freq: Every day | ORAL | Status: DC
Start: 2016-11-17 — End: 2016-11-23
  Administered 2016-11-17 – 2016-11-22 (×6): 400 mg via JEJUNOSTOMY
  Filled 2016-11-17 (×2): qty 2
  Filled 2016-11-17: qty 1
  Filled 2016-11-17 (×4): qty 2

## 2016-11-17 MED ORDER — CARBAMAZEPINE 200 MG PO TABS
200.0000 mg | ORAL_TABLET | ORAL | Status: DC
Start: 2016-11-17 — End: 2016-11-23
  Administered 2016-11-17 – 2016-11-22 (×16): 200 mg via JEJUNOSTOMY
  Filled 2016-11-17 (×16): qty 1

## 2016-11-17 NOTE — Progress Notes (Signed)
[facility] Progess Note    SYNOPSIS   This is a 56 year old morbidly obese male with a history of open appendectomy on 4/28. Readmitted with evisceration with necrotizing fasciitis with septic shock. Now status post laparotomy, debridement, multiple washouts. Overall, prognosis is guarded.   ASSESSMENT/PLAN   PULMONARY A: Acute vent-dependent respiratory failure.  Vent settings as below; Intubated 5/10 P:   -Continue current vent settings. -No plan for extubation as the patient is  to go back to the OR on Friday or Saturday for another washout. Tentative plan for transfer to [facility] for surgery to abdomen-- reconstruction due to extensive debridement.  --Will likely require tracheostomy either before or after transfer to [facility].   VENTILATOR SETTINGS: Vent Mode: PRVC FiO2 (%):  [30 %-70 %] 35 % Set Rate:  [24 bmp] 24 bmp Vt Set:  [500 mL] 500 mL PEEP:  [5 cmH20] 5 cmH20  CARDIOVASCULAR A: Continued septic shock with hypotension. -Currently on Levophed at 5 mics. P:  Wean down pressors as tolerated.   HEMODYNAMICS: CVP:  [5 mmHg-11 mmHg] 7 mmHg  RENAL A: -- Hypokalemia, replaced. P:   Continue gentle IV fluids.  INTAKE / OUTPUT:  Intake/Output Summary (Last 24 hours) at 11/15/16 0748 Last data filed at 11/15/16 0600  Gross per 24 hour  Intake          7853.74 ml  Output             4623 ml  Net          3230.74 ml    GASTROINTESTINAL A:   P:   Continue protonix.  Feeds per G tube.   HEMATOLOGIC A:  Leukocytosis, likely reactive to sepsis.  P:  Continue to monitor.   INFECTIOUS A:  Septic shock secondary to necrotizing fasciitis of the abdomen, status post washout. P:   Continue antibiotics, continue management per surgery.  Micro/culture results: MRSA PCR 5/10, negative BCx2 5/10, negative Surgical wound culture 5/10: Klebsiella oxytoca. Sputum: Pending  Antibiotics: Zosyn 5/10>>  ENDOCRINE A:  Elevated blood  glucose. P:   -Appears controlled with sliding scale insulin.  NEUROLOGIC A:  Metabolic encephalopathy, secondary to sepsis P:   RASS goal: -1   SIGNIFICANT EVENTS: 05/10-Pt admitted to ICU intubated post/op  LINES/TUBES: ETT 05/10>> Left IJ CVL 05/10>>  Best Practices  DVT Prophylaxis: Enoxaparin GI Prophylaxis: Protonix    ---------------------------------------   ----------------------------------------   Name: Peter [name] MRN: [id number] DOB: [birth year]    ADMISSION DATE:  11/11/2016    SUBJECTIVE:   Pt currently on the ventilator, still can not provide history or review of systems.  Remains critically ill Prognosis is poor  Review of Systems:  Critically ill, ROS unobtainable  VITAL SIGNS: Temp:  [98.9 F (37.2 C)-99.5 F (37.5 C)] 99 F (37.2 C) (05/16 1200) Pulse Rate:  [83-121] 87 (05/16 1300) Resp:  [21-33] 24 (05/16 1300) BP: (87-147)/(50-86) 105/58 (05/16 1300) SpO2:  [94 %-100 %] 98 % (05/16 1300) FiO2 (%):  [30 %] 30 % (05/16 1320)    Physical Examination:   VS: BP (!) 105/58   Pulse 87   Temp 99 F (37.2 C) (Axillary)   Resp (!) 24   Ht 6' (1.829 m)   Wt (!) 312 lb 9.8 oz (141.8 kg)   SpO2 98%   BMI 42.40 kg/m   General Appearance: intubated on Vent Neuro:without focal findings, mental status reduced HEENT: PERRLA, EOM intact. Pulmonary: normal breath sounds .; Decreased air entry  bilaterally.  CardiovascularNormal S1,S2.  No m/r/g.   Abdomen: Benign, Soft, non-tender. GCS<8T   LABORATORY PANEL:   CBC  Recent Labs Lab 11/17/16 0510  WBC 15.0*  HGB 9.0*  HCT 26.1*  PLT 317    Chemistries   Recent Labs Lab 11/16/16 1852  11/17/16 0443  NA 134*  --  136  K 3.5  --  3.1*  CL 101  --  104  CO2 27  --  27  GLUCOSE 105*  --  118*  BUN 11  --  12  CREATININE 0.74  --  0.89  CALCIUM 7.3*  --  7.1*  MG  --   < > 1.8  PHOS  --   < > 4.1  AST 46*  --   --   ALT 24  --   --   ALKPHOS 63  --   --    BILITOT 1.3*  --   --   < > = values in this interval not displayed.   Recent Labs Lab 11/16/16 1939 11/17/16 0048 11/17/16 0459 11/17/16 0706 11/17/16 1155 11/17/16 1528  GLUCAP 101* 107* 102* 110* 120* 110*    Recent Labs Lab 11/11/16 1251 11/14/16 0335  PHART 7.35 7.43  PCO2ART 50* 41  PO2ART 106 149*    Recent Labs Lab 11/11/16 1529 11/12/16 0431 11/16/16 1852  AST 41 32 46*  ALT 33 24 24  ALKPHOS 85 64 63  BILITOT 1.1 0.8 1.3*  ALBUMIN 2.3* 2.2* 1.4*    Cardiac Enzymes  Recent Labs Lab 11/13/16 0434  TROPONINI <0.03    RADIOLOGY:  Dg Naso G Tube Plc W/fl W/rad  Result Date: 11/15/2016 CLINICAL DATA:  Jejunal tube evaluation EXAM: NASO G TUBE PLACEMENT WITH FL AND WITH RAD CONTRAST:  67mL Isovue 300 FLUOROSCOPY TIME:  Fluoroscopy Time:  0.3 minutes Radiation Exposure Index (if provided by the fluoroscopic device): 8.3 mGy Number of Acquired Spot Images: 0 COMPARISON:  None. FINDINGS: The patient as a percutaneous enteric feeding tube with 1 limb extending into the stomach and a second terminating in the jejunum. The port labeled ' Feeding Tube' was hand injected with iodinated contrast with opacification of the duodenojejunal flexure. Patient tolerated the procedure well.  No immediate complications. IMPRESSION: The port labeled ' Feeding Tube' was hand injected with iodinated contrast with opacification of the duodenojejunal flexure confirming at the tip of the 'Feeding Tube' portion of the enteric tube is within the distal duodenum/proximal jejunum. Electronically Signed   By: Kathreen Devoid   On: 11/15/2016 16:01    Imaging No results found. STUDIES:  CT Abd Pelvis 05/10>>Open ventral lower abdominal wall wound and broad-based left lower quadrant abdominal wall hernia at the deep margin of the open wound measuring approximately 13 x 15 cm (ML by CC). Through the herniation extending into the open wound are loops of small and large bowel which demonstrate  wall thickening and surrounding inflammatory changes. There is proximal mild small bowel obstruction. Fat stranding in the right lower quadrant appendectomy bed may represent phlegmonous or postsurgical changes. No peritoneal abscess identified. Left adrenal hypodense nodule measures 8 Hounsfield units, probably an adenoma or scarring. 4-5 mm pulmonary nodules along right major fissure. No follow-up needed if patient is low-risk (and has no known or suspected primary neoplasm). Non-contrast chest CT can be considered in 12 months if patient is high-risk. This recommendation follows the consensus statement: Guidelines for Management of Incidental Pulmonary Nodules Detected on CT Images: From the Fleischner  Society 2017; Radiology 2017; [phone]     Critical Care Time devoted to patient care services described in this note is 34 minutes.   Overall, patient is critically ill, prognosis is guarded.  Patient with Multiorgan failure and at high risk for cardiac arrest and death.    Marda Stalker, M.D.  [facility]

## 2016-11-17 NOTE — Progress Notes (Signed)
Pharmacy Consult for Electrolyte Monitoring Indication: Hypokalemia/hypomagnesemia  Allergies  Allergen Reactions  . Isosorbide     Headache, nausea, vomiting    Patient Measurements: Height: 6' (182.9 cm) Weight: (!) 312 lb 9.8 oz (141.8 kg) IBW/kg (Calculated) : Peter.6  Vital Signs: Temp: 98.9 F (37.2 C) (05/16 0800) Temp Source: Axillary (05/16 0800) BP: 113/61 (05/16 1100) Pulse Rate: 90 (05/16 1100) Intake/Output from previous day: 05/15 0701 - 05/16 0700 In: 6446.6 [I.V.:5751.9; NG/GT:224.7; IV Piggyback:400] Out: 5035 [Urine:2160; Drains:2875] Intake/Output from this shift: Total I/O In: 1821 [I.V.:1113; Other:160; NG/GT:498; IV Piggyback:50] Out: 450 [Urine:100; Drains:350]  Labs:  Recent Labs  11/15/16 0427  11/16/16 0316 11/16/16 1724 11/16/16 1852 11/16/16 1853 11/17/16 0443 11/17/16 0510  WBC 18.1*  --   --   --   --   --   --  15.0*  HGB 10.1*  --   --   --   --   --   --  9.0*  HCT 30.0*  --   --   --   --   --   --  26.1*  PLT 420  --   --   --   --   --   --  317  CREATININE 1.01  --  1.00  --  0.74  --  0.89  --   MG 1.8  --  1.7 0.9*  --  2.0 1.8  --   PHOS  --   < > 4.0 2.1*  --  4.5 4.1  --   ALBUMIN  --   --   --   --  1.4*  --   --   --   PROT  --   --   --   --  4.5*  --   --   --   AST  --   --   --   --  46*  --   --   --   ALT  --   --   --   --  24  --   --   --   ALKPHOS  --   --   --   --  63  --   --   --   BILITOT  --   --   --   --  1.3*  --   --   --   < > = values in this interval not displayed. Estimated Creatinine Clearance: 132.2 mL/min (by C-G formula based on SCr of 0.89 mg/dL).  Potassium (mmol/L)  Date Value  11/17/2016 3.1 (L)   Phosphorus (mg/dL)  Date Value  11/17/2016 4.1   Calcium (mg/dL)  Date Value  11/17/2016 7.1 (L)   Albumin Corrected Calcium= 8.6 mg/dl  Assessment: 56 y/o Peter [name] with ex lap appendectomy 4/28 admitted to ICU due to evisceration. Patient is currently ventilated and sedated.   5/15:  Plan is to initiate feeds via J-tube after OR today. Patient with no nutrition for at least 7 days at high risk for refeeding syndrome.   Plan:  K replaced with 4 runs IV. Phos remains WNL but will recheck all electrolytes at 1800 due to recent initiation of feeding and risk of refeeding syndrome.   Ulice Dash, PharmD Clinical Pharmacist   11/17/2016

## 2016-11-17 NOTE — Progress Notes (Signed)
   11/17/16 1950  Termination of Wake Up Assessment (Document within 2 hours of WUA Start)  Follows commands No  Synchrony No  WUA Patient Response/RASS Score 2  Sedation outcome Resumed medications at 100%

## 2016-11-17 NOTE — Progress Notes (Signed)
   11/17/16 1900  Vitals  BP (!) 142/69  MAP (mmHg) 89  Pulse Rate 89  ECG Heart Rate 89  Cardiac Rhythm NSR  Resp (!) 24  Oxygen Therapy  SpO2 98 %  End Tidal CO2 (EtCO2) 39  Invasive Hemodynamic Monitoring  CVP (mmHg) (!) 50 mmHg  ECG Intervals  PR interval 0.18  QRS interval 0.11  QT interval 0.4  QTc interval 0.48     Levo @ 4 mcg

## 2016-11-17 NOTE — Progress Notes (Signed)
   11/17/16 1920  Pre-WUA / WUA Start  [place] Agitation Sedation Scale (RASS) -2  Pre WUA sedation dose Continuous  Follows commands No  Synchrony Yes  RASS Goal -1  Wake up assessment interventions Sedation decreased 50%

## 2016-11-17 NOTE — Progress Notes (Signed)
UOP has decreased this shift and has turned amber. Per Hinton Dyer, NP administered a 500 mL NS bolus, completed a bladder scan that resulted in 0 mL, and also irrigated his foley catheter with 50 cc NS.

## 2016-11-17 NOTE — Progress Notes (Signed)
Pharmacy Consult for Electrolyte Monitoring Indication: Hypokalemia/hypomagnesemia  Allergies  Allergen Reactions  . Isosorbide     Headache, nausea, vomiting    Patient Measurements: Height: 6' (182.9 cm) Weight: (!) 312 lb 9.8 oz (141.8 kg) IBW/kg (Calculated) : 77.6  Vital Signs: Temp: 99.4 F (37.4 C) (05/16 1600) Temp Source: Axillary (05/16 1600) BP: 132/83 (05/16 1800) Pulse Rate: 92 (05/16 1800) Intake/Output from previous day: 05/15 0701 - 05/16 0700 In: 6446.6 [I.V.:5751.9; NG/GT:224.7; IV Piggyback:400] Out: 5035 [Urine:2160; Drains:2875] Intake/Output from this shift: No intake/output data recorded.  Labs:  Recent Labs  11/15/16 0427  11/16/16 1724 11/16/16 1852 11/16/16 1853 11/17/16 0443 11/17/16 0510 11/17/16 1712  WBC 18.1*  --   --   --   --   --  15.0*  --   HGB 10.1*  --   --   --   --   --  9.0*  --   HCT 30.0*  --   --   --   --   --  26.1*  --   PLT 420  --   --   --   --   --  317  --   CREATININE 1.01  < >  --  0.74  --  0.89  --  0.78  MG 1.8  < > 0.9*  --  2.0 1.8  --   --   PHOS  --   < > 2.1*  --  4.5 4.1  --   --   ALBUMIN  --   --   --  1.4*  --   --   --   --   PROT  --   --   --  4.5*  --   --   --   --   AST  --   --   --  46*  --   --   --   --   ALT  --   --   --  24  --   --   --   --   ALKPHOS  --   --   --  63  --   --   --   --   BILITOT  --   --   --  1.3*  --   --   --   --   < > = values in this interval not displayed. Estimated Creatinine Clearance: 147.1 mL/min (by C-G formula based on SCr of 0.78 mg/dL).  Potassium (mmol/L)  Date Value  11/17/2016 3.3 (L)   Phosphorus (mg/dL)  Date Value  11/17/2016 4.1   Calcium (mg/dL)  Date Value  11/17/2016 6.9 (L)   Albumin Corrected Calcium= 8.6 mg/dl  Assessment: 56 y/o M with ex lap appendectomy 4/28 admitted to ICU due to evisceration. Patient is currently ventilated and sedated.   5/15: Plan is to initiate feeds via J-tube after OR today. Patient with no  nutrition for at least 7 days at high risk for refeeding syndrome.   Plan:  5/16 1712 K: 3.3. Patient was ordered KCL 50mEq  PO x 1 dose at 1830. Will not order additional replacement at this time. Will F/U with AM labs and continue to replace as needed.   Pernell Dupre, PharmD, BCPS Clinical Pharmacist 11/17/2016 7:07 PM

## 2016-11-17 NOTE — Care Management (Signed)
Discussed LTAC potential option of this patient during progression rounds however MD plans for transfer to [facility] for reconstructive surgery.

## 2016-11-17 NOTE — Progress Notes (Signed)
   11/17/16 2330  Vitals  BP (!) 96/53  MAP (mmHg) 65  Pulse Rate 93  ECG Heart Rate 93  Resp (!) 26  Oxygen Therapy  SpO2 98 %  End Tidal CO2 (EtCO2) 39     Levo @ 1 mcg

## 2016-11-17 NOTE — Plan of Care (Signed)
Results for Peter [name], Peter [name] (MRN [id number]) as of 11/17/2016 23:37  Ref. Range 11/17/2016 22:32  Potassium Latest Ref Range: 3.5 - 5.1 mmol/L 3.7  Phosphorus Latest Ref Range: 2.5 - 4.6 mg/dL 4.3  Magnesium Latest Ref Range: 1.7 - 2.4 mg/dL 1.7     Results read to NP

## 2016-11-18 ENCOUNTER — Inpatient Hospital Stay: Payer: 59 | Admitting: Registered Nurse

## 2016-11-18 ENCOUNTER — Encounter
Admission: EM | Disposition: A | Discharge status: Other Acute Inpatient Hospital | Payer: Self-pay | Source: Home / Self Care | Attending: Surgery

## 2016-11-18 ENCOUNTER — Encounter: Payer: Self-pay | Admitting: *Deleted

## 2016-11-18 HISTORY — PX: APPLICATION OF WOUND VAC: SHX5189

## 2016-11-18 LAB — BASIC METABOLIC PANEL
Anion gap: 5 (ref 5–15)
Anion gap: 4 — ABNORMAL LOW (ref 5–15)
BUN: 18 mg/dL (ref 6–20)
BUN: 18 mg/dL (ref 6–20)
CALCIUM: 7.1 mg/dL — AB (ref 8.9–10.3)
CHLORIDE: 109 mmol/L (ref 101–111)
CO2: 28 mmol/L (ref 22–32)
CO2: 25 mmol/L (ref 22–32)
CREATININE: 0.92 mg/dL (ref 0.61–1.24)
CREATININE: 0.81 mg/dL (ref 0.61–1.24)
Calcium: 6.8 mg/dL — ABNORMAL LOW (ref 8.9–10.3)
Chloride: 105 mmol/L (ref 101–111)
GFR calc Af Amer: >60 mL/min (ref >60)
GFR calc non Af Amer: >60 mL/min (ref >60)
GFR calc non Af Amer: >60 mL/min (ref >60)
GLUCOSE: 111 mg/dL — AB (ref 65–99)
GLUCOSE: 97 mg/dL (ref 65–99)
Potassium: 3.5 mmol/L (ref 3.5–5.1)
Potassium: 3.4 mmol/L — ABNORMAL LOW (ref 3.5–5.1)
Sodium: 138 mmol/L (ref 135–145)
Sodium: 138 mmol/L (ref 135–145)

## 2016-11-18 LAB — GLUCOSE, CAPILLARY
GLUCOSE-CAPILLARY: 104 mg/dL — AB (ref 65–99)
GLUCOSE-CAPILLARY: 120 mg/dL — AB (ref 65–99)
GLUCOSE-CAPILLARY: 92 mg/dL (ref 65–99)
Glucose-Capillary: 104 mg/dL — ABNORMAL HIGH (ref 65–99)
Glucose-Capillary: 126 mg/dL — ABNORMAL HIGH (ref 65–99)

## 2016-11-18 LAB — MAGNESIUM
MAGNESIUM: 1.6 mg/dL — AB (ref 1.7–2.4)
Magnesium: 1.9 mg/dL (ref 1.7–2.4)

## 2016-11-18 LAB — CBC
HCT: 25 % — ABNORMAL LOW (ref 40.0–52.0)
Hemoglobin: 8.5 g/dL — ABNORMAL LOW (ref 13.0–18.0)
MCH: 29.3 pg (ref 26.0–34.0)
MCHC: 34 g/dL (ref 32.0–36.0)
MCV: 86.2 fL (ref 80.0–100.0)
PLATELETS: 353 10*3/uL (ref 150–440)
RBC: 2.9 MIL/uL — ABNORMAL LOW (ref 4.40–5.90)
RDW: 13.9 % (ref 11.5–14.5)
WBC: 10.7 10*3/uL — ABNORMAL HIGH (ref 3.8–10.6)

## 2016-11-18 LAB — PHOSPHORUS
PHOSPHORUS: 4.5 mg/dL (ref 2.5–4.6)
Phosphorus: 4.1 mg/dL (ref 2.5–4.6)

## 2016-11-18 SURGERY — APPLICATION, WOUND VAC
Anesthesia: General | Site: Abdomen | Wound class: Clean Contaminated

## 2016-11-18 MED ORDER — LATANOPROST 0.005 % OP SOLN
1.0000 [drp] | Freq: Every day | OPHTHALMIC | Status: DC
  Administered 2016-11-18 – 2016-11-22 (×5): 1 [drp] via OPHTHALMIC
  Filled 2016-11-18 (×2): qty 2.5

## 2016-11-18 MED ORDER — ONDANSETRON HCL 4 MG/2ML IJ SOLN: 4.0000 mg | Freq: Once | INTRAMUSCULAR | Status: DC | PRN

## 2016-11-18 MED ORDER — FENTANYL CITRATE (PF) 100 MCG/2ML IJ SOLN: 25.0000 ug | INTRAMUSCULAR | Status: DC | PRN

## 2016-11-18 MED ORDER — POTASSIUM CHLORIDE 10 MEQ/50ML IV SOLN
10.0000 meq | INTRAVENOUS | Status: AC
  Administered 2016-11-18 (×2): 10 meq via INTRAVENOUS
  Filled 2016-11-18 (×2): qty 50

## 2016-11-18 MED ORDER — SODIUM CHLORIDE 0.9 % IV SOLN
INTRAVENOUS | Status: DC | PRN
  Administered 2016-11-18: 17:00:00 via INTRAVENOUS

## 2016-11-18 MED ORDER — MAGNESIUM SULFATE 2 GM/50ML IV SOLN
2.0000 g | Freq: Once | INTRAVENOUS | Status: AC
  Administered 2016-11-18: 2 g via INTRAVENOUS
  Filled 2016-11-18: qty 50

## 2016-11-18 MED ORDER — POTASSIUM CHLORIDE 10 MEQ/100ML IV SOLN
10.0000 meq | INTRAVENOUS | Status: AC
  Administered 2016-11-18 (×2): 10 meq via INTRAVENOUS
  Filled 2016-11-18 (×2): qty 100

## 2016-11-18 MED ORDER — SODIUM CHLORIDE 0.9 % IV SOLN
INTRAVENOUS | Status: DC | PRN
  Administered 2016-11-18: 225 ug/h via INTRAVENOUS

## 2016-11-18 MED ORDER — NOREPINEPHRINE BITARTRATE 1 MG/ML IV SOLN
INTRAVENOUS | Status: DC | PRN
  Administered 2016-11-18: 2 ug/min via INTRAVENOUS

## 2016-11-18 MED ORDER — PROPOFOL 500 MG/50ML IV EMUL
INTRAVENOUS | Status: DC | PRN
  Administered 2016-11-18: 50 ug/kg/min via INTRAVENOUS

## 2016-11-18 SURGICAL SUPPLY — 20 items
BLADE SURG 15 STRL LF DISP TIS (BLADE) ×2 IMPLANT
BLADE SURG 15 STRL SS (BLADE) ×1
CHLORAPREP W/TINT 10.5 ML (MISCELLANEOUS) IMPLANT
DRAPE INCISE IOBAN 66X45 STRL (DRAPES) ×3 IMPLANT
DRAPE LAPAROTOMY 100X77 ABD (DRAPES) IMPLANT
DRAPE UTILITY 15X26 TOWEL STRL (DRAPES) ×3 IMPLANT
ELECT CAUTERY BLADE 6.4 (BLADE) IMPLANT
ELECT REM PT RETURN 9FT ADLT (ELECTROSURGICAL)
ELECTRODE REM PT RTRN 9FT ADLT (ELECTROSURGICAL) IMPLANT
GAUZE SPONGE 4X4 12PLY STRL (GAUZE/BANDAGES/DRESSINGS) ×3 IMPLANT
GLOVE BIO SURGEON STRL SZ8 (GLOVE) ×3 IMPLANT
GOWN STRL REUS W/ TWL LRG LVL3 (GOWN DISPOSABLE) ×2 IMPLANT
GOWN STRL REUS W/TWL LRG LVL3 (GOWN DISPOSABLE) ×1
KIT RM TURNOVER STRD PROC AR (KITS) IMPLANT
NEEDLE HYPO 25X1 1.5 SAFETY (NEEDLE) ×3 IMPLANT
NS IRRIG 500ML POUR BTL (IV SOLUTION) ×3 IMPLANT
PACK BASIN MINOR ARMC (MISCELLANEOUS) ×3 IMPLANT
SPONGE LAP 18X18 5 PK (GAUZE/BANDAGES/DRESSINGS) ×3 IMPLANT
SYR BULB EAR ULCER 3OZ GRN STR (SYRINGE) ×3 IMPLANT
SYRINGE 10CC LL (SYRINGE) ×3 IMPLANT

## 2016-11-18 NOTE — Anesthesia Preprocedure Evaluation (Signed)
Anesthesia Evaluation  Patient identified by MRN, date of birth, ID band Patient awake    Reviewed: Allergy & Precautions, H&P , NPO status , Patient's Chart, lab work & pertinent test results, reviewed documented beta blocker date and time   Airway Mallampati: Intubated  TM Distance: >3 FB Neck ROM: full    Dental  (+) Teeth Intact   Pulmonary neg pulmonary ROS, shortness of breath, sleep apnea ,    Pulmonary exam normal        Cardiovascular hypertension, negative cardio ROS Normal cardiovascular exam Rhythm:regular Rate:Normal     Neuro/Psych PSYCHIATRIC DISORDERS negative neurological ROS  negative psych ROS   GI/Hepatic negative GI ROS, Neg liver ROS,   Endo/Other  negative endocrine ROS  Renal/GU negative Renal ROS  negative genitourinary   Musculoskeletal   Abdominal   Peds  Hematology negative hematology ROS (+)   Anesthesia Other Findings Past Medical History: No date: Blood in stool No date: Difficult intubation No date: Glaucoma     Comment: since 2004 No date: Hypertension     Comment: since 2000 No date: Obesity, unspecified 2011: Ulcer     Comment: gastric No date: Unspecified hemorrhoids without mention of com* Past Surgical History: 7/34/1937: APPLICATION OF WOUND VAC N/A     Comment: Procedure: APPLICATION OF WOUND VAC-ABDOMINAL;              Surgeon: Florene Glen, MD;  Location: [facility]              ORS;  Service: General;  Laterality: N/A; 06/23/2016: CARDIAC CATHETERIZATION Left     Comment: Procedure: Left Heart Cath and Coronary               Angiography;  Surgeon: Nelva Bush, MD;                Location: [facility];  Service:               Cardiovascular;  Laterality: Left; 1992: CHOLECYSTECTOMY 05/22/2010: COLONOSCOPY W/ POLYPECTOMY     Comment: 25mmtransverse colon polyp, traditional               serrated adenoma,negative for high grade               dysplasia  & malignancy. rectal               polyp-48mm,negative for dysplasia & malignancy. 2010: KNEE SURGERY Left 10/30/2016: LAPAROSCOPIC APPENDECTOMY N/A     Comment: Procedure: APPENDECTOMY LAPAROSCOPIC changed                to open application of wound vac;  Surgeon:               [name], MD;  Location: [facility] ORS;                Service: General;  Laterality: N/A; 11/11/2016: LAPAROTOMY N/A     Comment: Procedure: EXPLORATORY LAPAROTOMY, DEBRIDEMENT              OF ABDOMINAL WOUND, ABDOMINAL [place];                Surgeon: [name], MD;  Location: [facility]               ORS;  Service: General;  Laterality: N/A; 11/12/2016: LAPAROTOMY N/A     Comment: Procedure: EXPLORATORY LAPAROTOMY;  Surgeon:               Clayburn Pert,  MD;  Location: [facility] ORS;                Service: General;  Laterality: N/A; 11/14/2016: LAPAROTOMY N/A     Comment: Procedure: EXPLORATORY LAPAROTOMY; Irrigation,              partial closure;  Surgeon: Clayburn Pert,               MD;  Location: [facility] ORS;  Service: General;                Laterality: N/A; BMI    Body Mass Index:  41.91 kg/m     Reproductive/Obstetrics negative OB ROS                             Anesthesia Physical Anesthesia Plan  ASA: IV and emergent  Anesthesia Plan: General ETT   Post-op Pain Management:    Induction:   Airway Management Planned: Oral ETT  Additional Equipment:   Intra-op Plan:   Post-operative Plan: Possible Post-op intubation/ventilation  Informed Consent: I have reviewed the patients History and Physical, chart, labs and discussed the procedure including the risks, benefits and alternatives for the proposed anesthesia with the patient or authorized representative who has indicated his/her understanding and acceptance.   Dental Advisory Given  Plan Discussed with: CRNA  Anesthesia Plan Comments:         Anesthesia Quick Evaluation

## 2016-11-18 NOTE — Progress Notes (Signed)
2 Days Post-Op  Subjective: Patient stable on the vent and sedated.  Objective: Vital signs in last 24 hours: Temp:  [98.1 F (36.7 C)-99.9 F (37.7 C)] 99.2 F (37.3 C) (05/17 0200) Pulse Rate:  [82-96] 86 (05/17 0630) Resp:  [22-26] 24 (05/17 0630) BP: (89-151)/(49-84) 123/56 (05/17 0630) SpO2:  [90 %-100 %] 100 % (05/17 0822) FiO2 (%):  [30 %] 30 % (05/17 0822) Weight:  [309 lb (140.2 kg)] 309 lb (140.2 kg) (05/17 0417) Last BM Date:  (PTA)  Intake/Output from previous day: 05/16 0701 - 05/17 0700 In: 7012.7 [I.V.:4736.4; NG/GT:1258; IV Piggyback:358.3] Out: 3970 [Urine:1520; Drains:2450] Intake/Output this shift: No intake/output data recorded.  Physical exam:  Wound VAC in place no leakage. Abdomen is soft and nontender  Lab Results: CBC   Recent Labs  11/17/16 0510 11/18/16 0351  WBC 15.0* 10.7*  HGB 9.0* 8.5*  HCT 26.1* 25.0*  PLT 317 353   BMET  Recent Labs  11/17/16 1712 11/17/16 2232 11/18/16 0351  NA 134*  --  138  K 3.3* 3.7 3.5  CL 104  --  105  CO2 26  --  28  GLUCOSE 132*  --  111*  BUN 14  --  18  CREATININE 0.78  --  0.92  CALCIUM 6.9*  --  7.1*   PT/INR No results for input(s): LABPROT, INR in the last 72 hours. ABG No results for input(s): PHART, HCO3 in the last 72 hours.  Invalid input(s): PCO2, PO2  Studies/Results: No results found.  Anti-infectives: Anti-infectives    Start     Dose/Rate Route Frequency Ordered Stop   11/11/16 1600  piperacillin-tazobactam (ZOSYN) IVPB 4.5 g  Status:  Discontinued     4.5 g 200 mL/hr over 30 Minutes Intravenous Every 8 hours 11/11/16 0917 11/11/16 1320   11/11/16 1500  piperacillin-tazobactam (ZOSYN) IVPB 3.375 g  Status:  Discontinued     3.375 g 12.5 mL/hr over 240 Minutes Intravenous Every 8 hours 11/11/16 0705 11/11/16 1320   11/11/16 1400  piperacillin-tazobactam (ZOSYN) IVPB 4.5 g     4.5 g 25 mL/hr over 240 Minutes Intravenous Every 8 hours 11/11/16 1320     11/11/16 0733   piperacillin-tazobactam (ZOSYN) 3.375 GM/50ML IVPB    Comments:  Veda Canning: cabinet override      11/11/16 0733 11/11/16 0743   11/11/16 0715  piperacillin-tazobactam (ZOSYN) IVPB 4.5 g  Status:  Discontinued     4.5 g 200 mL/hr over 30 Minutes Intravenous  Once 11/11/16 0705 11/16/16 0133   11/11/16 0700  piperacillin-tazobactam (ZOSYN) IVPB 3.375 g  Status:  Discontinued     3.375 g 100 mL/hr over 30 Minutes Intravenous Every 6 hours 11/11/16 1941 11/11/16 0705      Assessment/Plan: s/p Procedure(s): APPLICATION OF WOUND VAC-ABDOMINAL   Nutrition is been started enterally. And he is tolerating that. Will plan wound VAC change in the operating room tomorrow and determine extubation status after that procedure. No family present to discuss at this time.  Florene Glen, MD, FACS  11/18/2016

## 2016-11-18 NOTE — Progress Notes (Signed)
[facility] Progess Note    SYNOPSIS   This is a 56 year old morbidly obese male with a history of open appendectomy on 4/28. Readmitted with evisceration with necrotizing fasciitis with septic shock. Now status post laparotomy, debridement, multiple washouts. Overall, prognosis is guarded.   ASSESSMENT/PLAN   PULMONARY A: Acute vent-dependent respiratory failure.  Vent settings as below; Intubated 5/10 P:   -Continue current vent settings as below. -No plan for extubation as the patient is  to go back to the OR on Friday or Saturday for another washout. Tentative plan for transfer to [facility] for surgery to abdomen-- reconstruction due to extensive debridement.  --Will likely require tracheostomy either before or after transfer to [facility].   VENTILATOR SETTINGS: Vent Mode: PRVC FiO2 (%):  [30 %-70 %] 35 % Set Rate:  [24 bmp] 24 bmp Vt Set:  [500 mL] 500 mL PEEP:  [5 cmH20] 5 cmH20  CARDIOVASCULAR A: Continued septic shock with hypotension. -Currently on Levophed at 3 mics. P:  Continue to Wean down pressors as tolerated.   HEMODYNAMICS: CVP:  [5 mmHg-11 mmHg] 7 mmHg  RENAL A: -- Hypokalemia, replaced. P:   Decrease IV fluids.  INTAKE / OUTPUT:  Intake/Output Summary (Last 24 hours) at 11/15/16 0748 Last data filed at 11/15/16 0600  Gross per 24 hour  Intake          7853.74 ml  Output             4623 ml  Net          3230.74 ml    GASTROINTESTINAL A:   P:   Continue protonix.  Feeds per G tube.   HEMATOLOGIC A:  Leukocytosis, likely reactive to sepsis.  P:  Continue to monitor.   INFECTIOUS A:  Septic shock secondary to necrotizing fasciitis of the abdomen, status post washout. P:   Continue antibiotics, continue management per surgery.  Micro/culture results: MRSA PCR 5/10, negative BCx2 5/10, negative Surgical wound culture 5/10: Klebsiella oxytoca. Sputum: Pending  Antibiotics: Zosyn 5/10>>  ENDOCRINE A:  Controlled  blood glucose. P:   -Appears controlled with sliding scale insulin.  NEUROLOGIC A:  Metabolic encephalopathy, secondary to sepsis P:   RASS goal: -1   SIGNIFICANT EVENTS: 05/10-Pt admitted to ICU intubated post/op  LINES/TUBES: ETT 05/10>> Left IJ CVL 05/10>>  Best Practices  DVT Prophylaxis: Enoxaparin GI Prophylaxis: Protonix    ---------------------------------------   ----------------------------------------   Name: Peter [name] MRN: [id number] DOB: 1960-09-03    ADMISSION DATE:  11/11/2016    SUBJECTIVE:   Pt currently on the ventilator, still can not provide history or review of systems.  Remains critically ill   Review of Systems:  Critically ill, ROS unobtainable  VITAL SIGNS: Temp:  [98.1 F (36.7 C)-99.9 F (37.7 C)] 99.2 F (37.3 C) (05/17 0200) Pulse Rate:  [82-96] 86 (05/17 0630) Resp:  [22-26] 24 (05/17 0630) BP: (89-151)/(49-84) 123/56 (05/17 0630) SpO2:  [90 %-99 %] 98 % (05/17 0630) FiO2 (%):  [30 %] 30 % (05/17 0421) Weight:  [309 lb (140.2 kg)] 309 lb (140.2 kg) (05/17 0417)    Physical Examination:   VS: BP (!) 123/56   Pulse 86   Temp 99.2 F (37.3 C) (Axillary)   Resp (!) 24   Ht 6' (1.829 m)   Wt (!) 309 lb (140.2 kg)   SpO2 98%   BMI 41.91 kg/m   General Appearance: intubated on Vent Neuro:without focal findings, mental status reduced HEENT:  PERRLA, EOM intact. Pulmonary: Still ; Decreased air entry bilaterally.  CardiovascularNormal S1,S2.  No m/r/g.   Abdomen: Benign, Soft, non-tender. GCS<8T   LABORATORY PANEL:   CBC  Recent Labs Lab 11/18/16 0351  WBC 10.7*  HGB 8.5*  HCT 25.0*  PLT 353    Chemistries   Recent Labs Lab 11/16/16 1852  11/18/16 0351  NA 134*  < > 138  K 3.5  < > 3.5  CL 101  < > 105  CO2 27  < > 28  GLUCOSE 105*  < > 111*  BUN 11  < > 18  CREATININE 0.74  < > 0.92  CALCIUM 7.3*  < > 7.1*  MG  --   < > 1.6*  PHOS  --   < > 4.5  AST 46*  --   --   ALT 24  --   --    ALKPHOS 63  --   --   BILITOT 1.3*  --   --   < > = values in this interval not displayed.   Recent Labs Lab 11/17/16 1155 11/17/16 1528 11/17/16 1938 11/17/16 2340 11/18/16 0404 11/18/16 0713  GLUCAP 120* 110* 120* 126* 104* 104*    Recent Labs Lab 11/11/16 1251 11/14/16 0335  PHART 7.35 7.43  PCO2ART 50* 41  PO2ART 106 149*    Recent Labs Lab 11/11/16 1529 11/12/16 0431 11/16/16 1852  AST 41 32 46*  ALT 33 24 24  ALKPHOS 85 64 63  BILITOT 1.1 0.8 1.3*  ALBUMIN 2.3* 2.2* 1.4*    Cardiac Enzymes  Recent Labs Lab 11/13/16 0434  TROPONINI <0.03    RADIOLOGY:  No results found.  Imaging No results found. STUDIES:  CT Abd Pelvis 05/10>>Open ventral lower abdominal wall wound and broad-based left lower quadrant abdominal wall hernia at the deep margin of the open wound measuring approximately 13 x 15 cm (ML by CC). Through the herniation extending into the open wound are loops of small and large bowel which demonstrate wall thickening and surrounding inflammatory changes. There is proximal mild small bowel obstruction. Fat stranding in the right lower quadrant appendectomy bed may represent phlegmonous or postsurgical changes. No peritoneal abscess identified. Left adrenal hypodense nodule measures 8 Hounsfield units, probably an adenoma or scarring. 4-5 mm pulmonary nodules along right major fissure. No follow-up needed if patient is low-risk (and has no known or suspected primary neoplasm). Non-contrast chest CT can be considered in 12 months if patient is high-risk. This recommendation follows the consensus statement: Guidelines for Management of Incidental Pulmonary Nodules Detected on CT Images: From the Fleischner Society 2017; Radiology 2017; [id number]     Critical Care Time devoted to patient care services described in this note is 31 minutes.   Overall, patient is critically ill, prognosis is guarded.  Patient with Multiorgan failure and at high  risk for cardiac arrest and death.    Marda Stalker, M.D.  [facility]

## 2016-11-18 NOTE — Progress Notes (Signed)
Pharmacy Consult for Electrolyte Monitoring Indication: Hypokalemia/hypomagnesemia  Allergies  Allergen Reactions  . Isosorbide     Headache, nausea, vomiting    Patient Measurements: Height: 6' (182.9 cm) Weight: (!) 309 lb (140.2 kg) IBW/kg (Calculated) : 77.6  Vital Signs: Temp: 98.5 F (36.9 C) (05/17 1200) Temp Source: Axillary (05/17 1200) BP: 149/78 (05/17 1200) Pulse Rate: 91 (05/17 1200) Intake/Output from previous day: 05/16 0701 - 05/17 0700 In: 7202.4 [I.V.:4926.1; NG/GT:1258; IV Piggyback:358.3] Out: 3970 [Urine:1520; Drains:2450] Intake/Output from this shift: Total I/O In: 500.5 [I.V.:500.5] Out: 325 [Urine:325]  Labs:  Recent Labs  11/16/16 1852  11/17/16 0443 11/17/16 0510 11/17/16 1712 11/17/16 2232 11/18/16 0351  WBC  --   --   --  15.0*  --   --  10.7*  HGB  --   --   --  9.0*  --   --  8.5*  HCT  --   --   --  26.1*  --   --  25.0*  PLT  --   --   --  317  --   --  353  CREATININE 0.74  --  0.89  --  0.78  --  0.92  MG  --   < > 1.8  --   --  1.7 1.6*  PHOS  --   < > 4.1  --   --  4.3 4.5  ALBUMIN 1.4*  --   --   --   --   --   --   PROT 4.5*  --   --   --   --   --   --   AST 46*  --   --   --   --   --   --   ALT 24  --   --   --   --   --   --   ALKPHOS 63  --   --   --   --   --   --   BILITOT 1.3*  --   --   --   --   --   --   < > = values in this interval not displayed. Estimated Creatinine Clearance: 127 mL/min (by C-G formula based on SCr of 0.92 mg/dL).  Potassium (mmol/L)  Date Value  11/18/2016 3.5   Phosphorus (mg/dL)  Date Value  11/18/2016 4.5   Calcium (mg/dL)  Date Value  11/18/2016 7.1 (L)   Albumin Corrected Calcium= 9.2 mg/dl  Assessment: 56 y/o M with ex lap appendectomy 4/28 admitted to ICU due to evisceration. Patient is currently ventilated and sedated.   J-tube placed 5/15. Patient with no nutrition for at least 7 days at high risk for refeeding syndrome.   Plan:  Magnesium replaced. Will recheck  labs this PM and consider changing to daily monitoring tomorrow. Currently, patient remains at risk for refeeding syndrome.   Napoleon Form, PharmD, BCPS Clinical Pharmacist 11/18/2016 12:33 PM

## 2016-11-18 NOTE — Progress Notes (Signed)
Central line dressing was changed on 11/14/2016. Order for change today is incorrect - change is due 11/21/2016.

## 2016-11-18 NOTE — Progress Notes (Signed)
   11/18/16 0000  Vitals  BP (!) 93/49  MAP (mmHg) (!) 62  Pulse Rate 90  ECG Heart Rate 90  Resp (!) 24  Oxygen Therapy  SpO2 99 %  End Tidal CO2 (EtCO2) 37  Invasive Hemodynamic Monitoring  CVP (mmHg) 12 mmHg

## 2016-11-18 NOTE — Progress Notes (Addendum)
At 1503 hours, this nurse responded to patient's room when wound vac system alarmed. Leak was noted. Wound care was paged re: wound vac leaking inferiorly. Santiago Glad, RN advised surgery was managing this patient's vac. Surgery was paged. Dr. Burt Knack was notified of vac leaking. Same came to bedside and examined patient. Plan was to take patient for vac change tomorrow as originally planned, however, Dr. Burt Knack was able to get OR time now. Verbal consent was obtained from wife via telephone with this nurse and Mellody Memos., RN. Report was called to Walker, RN in [place]. Patient was taken to OR at 1630 hours on transport vent w/ RT & Chasity, NT accompanying this nurse. Hand-off report and chart was given to [place], RN in the OR. (This RN was told about yesterday's vac leaking in report this AM, unaware that surgery was not notified yesterday when this occurred.)

## 2016-11-18 NOTE — Progress Notes (Signed)
Pharmacy Consult for Electrolyte Monitoring Indication: Hypokalemia/hypomagnesemia  Allergies  Allergen Reactions  . Isosorbide     Headache, nausea, vomiting    Patient Measurements: Height: 6' (182.9 cm) Weight: (!) 309 lb (140.2 kg) IBW/kg (Calculated) : 77.6  Vital Signs: Temp: 98.8 F (37.1 C) (05/17 1600) Temp Source: Axillary (05/17 1600) BP: 123/71 (05/17 1800) Pulse Rate: 83 (05/17 1800) Intake/Output from previous day: 05/16 0701 - 05/17 0700 In: 7202.4 [I.V.:4926.1; NG/GT:1258; IV Piggyback:358.3] Out: 3970 [Urine:1520; Drains:2450] Intake/Output from this shift: Total I/O In: 1680.9 [I.V.:1680.9] Out: 1775 [Urine:975; Drains:800]  Labs:  Recent Labs  11/16/16 1852  11/17/16 0510 11/17/16 1712 11/17/16 2232 11/18/16 0351 11/18/16 1814  WBC  --   --  15.0*  --   --  10.7*  --   HGB  --   --  9.0*  --   --  8.5*  --   HCT  --   --  26.1*  --   --  25.0*  --   PLT  --   --  317  --   --  353  --   CREATININE 0.74  < >  --  0.78  --  0.92 0.81  MG  --   < >  --   --  1.7 1.6* 1.9  PHOS  --   < >  --   --  4.3 4.5 4.1  ALBUMIN 1.4*  --   --   --   --   --   --   PROT 4.5*  --   --   --   --   --   --   AST 46*  --   --   --   --   --   --   ALT 24  --   --   --   --   --   --   ALKPHOS 63  --   --   --   --   --   --   BILITOT 1.3*  --   --   --   --   --   --   < > = values in this interval not displayed. Estimated Creatinine Clearance: 144.3 mL/min (by C-G formula based on SCr of 0.81 mg/dL).  Potassium (mmol/L)  Date Value  11/18/2016 3.4 (L)   Phosphorus (mg/dL)  Date Value  11/18/2016 4.1   Calcium (mg/dL)  Date Value  11/18/2016 6.8 (L)   Albumin Corrected Calcium= 9.2 mg/dl  Assessment: 56 y/o M with ex lap appendectomy 4/28 admitted to ICU due to evisceration. Patient is currently ventilated and sedated.   J-tube placed 5/15. Patient with no nutrition for at least 7 days at high risk for refeeding syndrome.   Plan:  Mg: 1.9     Phos: 4.1   K: 3.4  Will replace K with KCL 60mEq IV x 2. Will recheck electrolytes with AM labs.   Pernell Dupre, PharmD, BCPS Clinical Pharmacist 11/18/2016 7:00 PM

## 2016-11-18 NOTE — Progress Notes (Signed)
Myriam Jacobson, RN in [place] called to advise that the wound vac has been changed and they will be bringing this patient back to ICU 1 in 15-20 minutes. Dr. Burt Knack advised that the tube feedings did not need to be d/c'd at midnight tonight; patient is not going back to the OR tomorrow morning.

## 2016-11-18 NOTE — Op Note (Signed)
11/11/2016 - 11/18/2016  5:14 PM  PATIENT:  Peter [name]  56 y.o. male  PRE-OPERATIVE DIAGNOSIS:  Abdominal wall wound  POST-OPERATIVE DIAGNOSIS:  Same  PROCEDURE: Wound VAC change  SURGEON:  Florene Glen MD, FACS   ANESTHESIA:   Gen. with endotracheal tube   Details of Procedure: This patient with a complex abdominal wound after evisceration and necrotizing soft tissue infection. He requires wound VAC change due to leakage of the present wound VAC.  Description of procedure patient was taken to the operating room and left in the ICU bed and induced to general anesthesia. A time out was held. And then the old wound VAC was removed.  A Yankauer suction tip was utilized to gently's remove serous fluid from the wound itself and drying of the pannus and groin fold was performed with dry towels. An Ioban drape was utilized to isolate the J-tube which should been a site for potential leakage. Inspection of the bowel demonstrated good granulation tissue present and no sign of continued sepsis. The blue foam (Octopus) was placed into the wound as was a piece of the blue foam with plastic covering into the left lateral cavity. Over this was carefully placed the football-shaped an appropriately sized blue foam.  Adhesive wrap was then placed in the standard fashion and care was taken in the groin crease and pannus fold to allow for good adherence to dry tissue. This was then placed to suction via the suction device and there was no sign of leakage. There was a good seal and good vacuum.  Patient was taken back to the ICU in stable condition. Sponge lap needle count was correct. EBL nil   Florene Glen, MD FACS

## 2016-11-18 NOTE — Progress Notes (Signed)
[facility] ADULT ICU REPLACEMENT PROTOCOL FOR AM LAB REPLACEMENT ONLY  The patient does apply for the [facility] Adult ICU Electrolyte Replacment Protocol based on the criteria listed below:   1. Is GFR >/= 40 ml/min? Yes.    Patient's GFR today is >60 2. Is urine output >/= 0.5 ml/kg/hr for the last 6 hours? Yes.   Patient's UOP is 0.9 ml/kg/hr 3. Is BUN < 60 mg/dL? Yes.    Patient's BUN today is 18 4. Abnormal electrolyte(s): K3.5,Mg1.6 5. Ordered repletion with: per protocol 6. If a panic level lab has been reported, has the CCM MD in charge been notified? Yes.  .   Physician:  Elder Cyphers, MD  [facility] 11/18/2016 5:35 AM

## 2016-11-18 NOTE — Progress Notes (Signed)
Called to see patient with leaking wound VAC. He had not been leaking early this morning when I saw him but the RN stated that he was leaking most of yesterday and then again today but I was never notified of that. On inspection the wound VAC is leaking serous fluid inferior and also leaking around the J-tube  Patient was scheduled for routine wound VAC change tomorrow but I will attempt to accelerate data and perform the procedure this evening. No family is present but will obtain telephone conversation if operating room time is available. I discussed his tube feedings status tube feedings in the jejunum with Dr. Andree Elk of anesthesia. He is on the ventilator with a protected airway as well.

## 2016-11-18 NOTE — Anesthesia Post-op Follow-up Note (Cosign Needed)
Anesthesia QCDR form completed.        

## 2016-11-18 NOTE — Transfer of Care (Signed)
Immediate Anesthesia Transfer of Care Note  Patient: Peter [name]  Procedure(s) Performed: Procedure(s) with comments: APPLICATION OF WOUND VAC (N/A) - change  Patient Location: PACU and ICU  Anesthesia Type:General  Level of Consciousness: Patient remains intubated per anesthesia plan  Airway & Oxygen Therapy: Patient placed on Ventilator (see vital sign flow sheet for setting)  Post-op Assessment: Report given to RN and Post -op Vital signs reviewed and stable  Post vital signs: Reviewed and stable  Last Vitals:  Vitals:   11/18/16 1400 11/18/16 1500              5//17/18  BP: (!) 147/70 133/63                118/62  Pulse: 89 86                             82  Resp: (!) 23 (!) 24                          24/vent  Temp:      Last Pain:  Vitals:   11/18/16 1200  TempSrc: Axillary  PainSc:          Complications: No apparent anesthesia complications

## 2016-11-18 NOTE — Progress Notes (Signed)
Patient returned from OR at 1720 hours. VSS, no change in patient condition/status. All gtts remain at the same rate as before going to OR. MD & CRNA report unremarkable procedure.

## 2016-11-18 NOTE — Plan of Care (Signed)
Oxygen saturation probe changed site on the hand

## 2016-11-19 ENCOUNTER — Encounter: Payer: Self-pay | Admitting: Surgery

## 2016-11-19 ENCOUNTER — Inpatient Hospital Stay: Payer: 59

## 2016-11-19 DIAGNOSIS — T8131XA Disruption of external operation (surgical) wound, not elsewhere classified, initial encounter: Secondary | ICD-10-CM

## 2016-11-19 LAB — CBC
HCT: 27 % — ABNORMAL LOW (ref 40.0–52.0)
HEMOGLOBIN: 9.2 g/dL — AB (ref 13.0–18.0)
MCH: 29.5 pg (ref 26.0–34.0)
MCHC: 34 g/dL (ref 32.0–36.0)
MCV: 86.8 fL (ref 80.0–100.0)
PLATELETS: 316 10*3/uL (ref 150–440)
RBC: 3.11 MIL/uL — ABNORMAL LOW (ref 4.40–5.90)
RDW: 13.8 % (ref 11.5–14.5)
WBC: 9.3 10*3/uL (ref 3.8–10.6)

## 2016-11-19 LAB — GLUCOSE, CAPILLARY
GLUCOSE-CAPILLARY: 95 mg/dL (ref 65–99)
Glucose-Capillary: 87 mg/dL (ref 65–99)
Glucose-Capillary: 98 mg/dL (ref 65–99)
Glucose-Capillary: 95 mg/dL (ref 65–99)
Glucose-Capillary: 111 mg/dL — ABNORMAL HIGH (ref 65–99)

## 2016-11-19 LAB — BASIC METABOLIC PANEL
ANION GAP: 4 — AB (ref 5–15)
BUN: 21 mg/dL — AB (ref 6–20)
CALCIUM: 7.4 mg/dL — AB (ref 8.9–10.3)
CO2: 29 mmol/L (ref 22–32)
CREATININE: 0.82 mg/dL (ref 0.61–1.24)
Chloride: 106 mmol/L (ref 101–111)
GFR calc Af Amer: >60 mL/min (ref >60)
GLUCOSE: 106 mg/dL — AB (ref 65–99)
Potassium: 3.6 mmol/L (ref 3.5–5.1)
Sodium: 139 mmol/L (ref 135–145)

## 2016-11-19 LAB — MAGNESIUM: MAGNESIUM: 1.9 mg/dL (ref 1.7–2.4)

## 2016-11-19 LAB — ALBUMIN: Albumin: 1.2 g/dL — ABNORMAL LOW (ref 3.5–5.0)

## 2016-11-19 LAB — PHOSPHORUS: Phosphorus: 4.1 mg/dL (ref 2.5–4.6)

## 2016-11-19 MED ORDER — MAGNESIUM SULFATE 2 GM/50ML IV SOLN
2.0000 g | Freq: Once | INTRAVENOUS | Status: AC
  Administered 2016-11-19: 2 g via INTRAVENOUS
  Filled 2016-11-19: qty 50

## 2016-11-19 MED ORDER — POTASSIUM CHLORIDE 20 MEQ PO PACK
20.0000 meq | PACK | Freq: Every day | ORAL | Status: DC
  Administered 2016-11-19 – 2016-11-21 (×3): 20 meq via ORAL
  Filled 2016-11-19 (×3): qty 1

## 2016-11-19 MED ORDER — CLINDAMYCIN PHOSPHATE 600 MG/50ML IV SOLN
600.0000 mg | Freq: Three times a day (TID) | INTRAVENOUS | Status: DC
  Administered 2016-11-19: 600 mg via INTRAVENOUS
  Filled 2016-11-19 (×2): qty 50

## 2016-11-19 MED ORDER — SODIUM CHLORIDE 0.9% FLUSH
10.0000 mL | INTRAVENOUS | Status: DC | PRN
  Administered 2016-11-20: 10 mL
  Filled 2016-11-19: qty 40

## 2016-11-19 MED ORDER — SODIUM CHLORIDE 0.9% FLUSH
10.0000 mL | Freq: Two times a day (BID) | INTRAVENOUS | Status: DC
  Administered 2016-11-19 – 2016-11-23 (×6): 10 mL

## 2016-11-19 NOTE — Progress Notes (Signed)
Pharmacy Consult for Electrolyte Monitoring Indication: Hypokalemia/hypomagnesemia  Allergies  Allergen Reactions  . Isosorbide     Headache, nausea, vomiting    Patient Measurements: Height: 6' (182.9 cm) Weight:  (unable to weigh, bed scale broken) IBW/kg (Calculated) : 77.6  Vital Signs: Temp: 99 F (37.2 C) (05/18 0800) Temp Source: Axillary (05/18 0800) BP: 104/52 (05/18 1100) Pulse Rate: 95 (05/18 1100) Intake/Output from previous day: 05/17 0701 - 05/18 0700 In: 4360.4 [I.V.:2640.4; NG/GT:960; IV Piggyback:500] Out: 4650 [Urine:1850; Drains:2800] Intake/Output from this shift: Total I/O In: 189.6 [I.V.:189.6] Out: -   Labs:  Recent Labs  11/16/16 1852  11/17/16 0510  11/18/16 0351 11/18/16 1814 11/19/16 0429  WBC  --   --  15.0*  --  10.7*  --  9.3  HGB  --   --  9.0*  --  8.5*  --  9.2*  HCT  --   --  26.1*  --  25.0*  --  27.0*  PLT  --   --  317  --  353  --  316  CREATININE 0.74  < >  --   < > 0.92 0.81 0.82  MG  --   < >  --   < > 1.6* 1.9 1.9  PHOS  --   < >  --   < > 4.5 4.1 4.1  ALBUMIN 1.4*  --   --   --   --   --  1.2*  PROT 4.5*  --   --   --   --   --   --   AST 46*  --   --   --   --   --   --   ALT 24  --   --   --   --   --   --   ALKPHOS 63  --   --   --   --   --   --   BILITOT 1.3*  --   --   --   --   --   --   < > = values in this interval not displayed. Estimated Creatinine Clearance: 142.5 mL/min (by C-G formula based on SCr of 0.82 mg/dL).  Potassium (mmol/L)  Date Value  11/19/2016 3.6   Phosphorus (mg/dL)  Date Value  11/19/2016 4.1   Calcium (mg/dL)  Date Value  11/19/2016 7.4 (L)   Albumin Corrected Calcium= 9.6 mg/dL  Assessment: 56 y/o M with ex lap appendectomy 4/28 admitted to ICU due to evisceration. Patient is currently ventilated and sedated. Patient receiving Vital High Protein at  76mL/hr.   J-tube placed 5/15. Patient with no nutrition for at least 7 days at high risk for refeeding syndrome.   Plan:   Will replace magnesium 2g IV x1 to maintain magnesium >2. Will order potassium 60mEq VT daily. Will continue to monitor electrolytes daily.   Pharmacy will continue to monitor and adjust per consult   Simpson,Michael L 11/19/2016 2:03 PM

## 2016-11-19 NOTE — Progress Notes (Signed)
1 Day Post-Op  Subjective: Wound VAC in place without leakage at this time ablation on the vent in stable  Objective: Vital signs in last 24 hours: Temp:  [98.5 F (36.9 C)-99.6 F (37.6 C)] 99.2 F (37.3 C) (05/18 0400) Pulse Rate:  [83-108] 97 (05/18 0600) Resp:  [21-26] 24 (05/18 0600) BP: (96-155)/(54-88) 104/56 (05/18 0600) SpO2:  [95 %-99 %] 99 % (05/18 0600) FiO2 (%):  [30 %] 30 % (05/18 0831) Last BM Date:  (PTA)  Intake/Output from previous day: 05/17 0701 - 05/18 0700 In: 4051.6 [I.V.:2331.6; NG/GT:960; IV Piggyback:500] Out: 4650 [Urine:1850; Drains:2800] Intake/Output this shift: No intake/output data recorded.  Physical exam:  No leakage on wound VAC. Morbidly obese male patient  Lab Results: CBC   Recent Labs  11/18/16 0351 11/19/16 0429  WBC 10.7* 9.3  HGB 8.5* 9.2*  HCT 25.0* 27.0*  PLT 353 316   BMET  Recent Labs  11/18/16 1814 11/19/16 0429  NA 138 139  K 3.4* 3.6  CL 109 106  CO2 25 29  GLUCOSE 97 106*  BUN 18 21*  CREATININE 0.81 0.82  CALCIUM 6.8* 7.4*   PT/INR No results for input(s): LABPROT, INR in the last 72 hours. ABG No results for input(s): PHART, HCO3 in the last 72 hours.  Invalid input(s): PCO2, PO2  Studies/Results: No results found.  Anti-infectives: Anti-infectives    Start     Dose/Rate Route Frequency Ordered Stop   11/11/16 1600  piperacillin-tazobactam (ZOSYN) IVPB 4.5 g  Status:  Discontinued     4.5 g 200 mL/hr over 30 Minutes Intravenous Every 8 hours 11/11/16 0917 11/11/16 1320   11/11/16 1500  piperacillin-tazobactam (ZOSYN) IVPB 3.375 g  Status:  Discontinued     3.375 g 12.5 mL/hr over 240 Minutes Intravenous Every 8 hours 11/11/16 0705 11/11/16 1320   11/11/16 1400  piperacillin-tazobactam (ZOSYN) IVPB 4.5 g     4.5 g 25 mL/hr over 240 Minutes Intravenous Every 8 hours 11/11/16 1320     11/11/16 0733  piperacillin-tazobactam (ZOSYN) 3.375 GM/50ML IVPB    Comments:  Veda Canning: cabinet  override      11/11/16 0733 11/11/16 0743   11/11/16 0715  piperacillin-tazobactam (ZOSYN) IVPB 4.5 g  Status:  Discontinued     4.5 g 200 mL/hr over 30 Minutes Intravenous  Once 11/11/16 0705 11/16/16 0133   11/11/16 0700  piperacillin-tazobactam (ZOSYN) IVPB 3.375 g  Status:  Discontinued     3.375 g 100 mL/hr over 30 Minutes Intravenous Every 6 hours 11/11/16 0652 11/11/16 0705      Assessment/Plan: s/p Procedure(s): APPLICATION OF WOUND VAC   No leakage on wound VAC continue current therapy would anticipate wound VAC change on Sunday  Florene Glen, MD, FACS  11/19/2016

## 2016-11-19 NOTE — Care Management (Signed)
RNCM spoke with Dr. Burt Knack regarding LTAC potential and plans for transition to [facility]. Patient will need to remain at [facility] under surgical management until infection is cleared for reconstructive surgery at [facility]. Dr. Burt Knack feels that patient's care would be better managed at this level of care. LTAC will not be pursued at this time.

## 2016-11-19 NOTE — Anesthesia Postprocedure Evaluation (Signed)
Anesthesia Post Note  Patient: Peter [name]  Procedure(s) Performed: Procedure(s) (LRB): APPLICATION OF WOUND VAC (N/A)  Patient location during evaluation: ICU Anesthesia Type: General Level of consciousness: sedated Pain management: pain level controlled Vital Signs Assessment: post-procedure vital signs reviewed and stable Respiratory status: patient on ventilator - see flowsheet for VS Cardiovascular status: stable Anesthetic complications: no     Last Vitals:  Vitals:   11/19/16 0500 11/19/16 0600  BP: 139/88 (!) 104/56  Pulse: (!) 102 97  Resp: (!) 23 (!) 24  Temp:      Last Pain:  Vitals:   11/19/16 0400  TempSrc: Axillary  PainSc:                  Hedda Slade

## 2016-11-19 NOTE — Progress Notes (Signed)
After further discussion with wife at bedside I have explained the benefits of Trach and PICC line placement. She has agreed to both, Will place consult for IV team for PICC line Case Discussed with Dr. Pryor Ochoa with [facility] for [facility].   Family satisfied with Plan of action and management. All questions answered  Corrin Parker, M.D.  [facility]  Medical Director [facility] Director [facility] Cardio-Pulmonary Department

## 2016-11-19 NOTE — Progress Notes (Addendum)
Pharmacy Antibiotic Note  Peter [name] is a 56 y.o. male admitted on 11/11/2016 with evisceration of bowel/necrotizing infection.   Plan: Due to patient body habitus and severity of infection, will continue Zosyn 4.5g IV Q8hr.   Will follow ID recommendations.   Height: 6' (182.9 cm) Weight:  (unable to weigh, bed scale broken) IBW/kg (Calculated) : 77.6  Temp (24hrs), Avg:99.1 F (37.3 C), Min:98.8 F (37.1 C), Max:99.6 F (37.6 C)   Recent Labs Lab 11/14/16 0331 11/15/16 0427  11/17/16 0443 11/17/16 0510 11/17/16 1712 11/18/16 0351 11/18/16 1814 11/19/16 0429  WBC 19.1* 18.1*  --   --  15.0*  --  10.7*  --  9.3  CREATININE 1.38* 1.01  < > 0.89  --  0.78 0.92 0.81 0.82  < > = values in this interval not displayed.  Estimated Creatinine Clearance: 142.5 mL/min (by C-G formula based on SCr of 0.82 mg/dL).    Allergies  Allergen Reactions  . Isosorbide     Headache, nausea, vomiting    Antimicrobials this admission: Zosyn 5/10 >>   Dose adjustments this admission: 5/10 Zosyn transitioned to 4.5g IV Q8hr   Microbiology results: 5/10 BCx: no growth x 5 days  5/13 Sputum: few candida albicans  5/10 Abdominal Tissue: Klebsiella Oxytoca and Enterobacter Cloacae  5/10 MRSA PCR: negative  Thank you for allowing pharmacy to be a part of this patient's care.  Simpson,Michael L 11/19/2016 2:14 PM

## 2016-11-19 NOTE — Consult Note (Signed)
.. Peter [name], [place] [id number] 28-Jul-1960 [name], MD  Reason for Consult: Need for long term ventilatory support  HPI: 56 y.o. Male with open abdomen and need for long term ventilatory support.  Asked to evaluate for tracheostomy tube placement due to difficult airway and need for frequent trips to OR for wound vac changes and wound debridement.  Allergies:  Allergies  Allergen Reactions  . Isosorbide     Headache, nausea, vomiting    ROS: Review of systems normal other than 12 systems except per HPI.  PMH:  Past Medical History:  Diagnosis Date  . Blood in stool   . Difficult intubation   . Glaucoma    since 2004  . Hypertension    since 2000  . Obesity, unspecified   . Ulcer 2011   gastric  . Unspecified hemorrhoids without mention of complication     FH:  Family History  Problem Relation Age of Onset  . Cancer Other        colon  . Hyperlipidemia Mother   . Hypertension Mother     SH:  Social History   Social History  . Marital status: Married    Spouse name: N/A  . Number of children: N/A  . Years of education: N/A   Occupational History  . Not on file.   Social History Main Topics  . Smoking status: Never Smoker  . Smokeless tobacco: Never Used  . Alcohol use Yes     Comment: occasionally  . Drug use: No  . Sexual activity: Not on file   Other Topics Concern  . Not on file   Social History Narrative  . No narrative on file    PSH:  Past Surgical History:  Procedure Laterality Date  . APPLICATION OF WOUND VAC N/A 11/16/2016   Procedure: APPLICATION OF WOUND VAC-ABDOMINAL;  Surgeon: Florene Glen, MD;  Location: [facility] ORS;  Service: General;  Laterality: N/A;  . APPLICATION OF WOUND VAC N/A 11/18/2016   Procedure: APPLICATION OF WOUND VAC;  Surgeon: Florene Glen, MD;  Location: [facility] ORS;  Service: General;  Laterality: N/A;  change  . CARDIAC CATHETERIZATION Left 06/23/2016   Procedure: Left Heart Cath and Coronary  Angiography;  Surgeon: Nelva Bush, MD;  Location: [facility];  Service: Cardiovascular;  Laterality: Left;  . CHOLECYSTECTOMY  1992  . COLONOSCOPY W/ POLYPECTOMY  05/22/2010   87mmtransverse colon polyp, traditional serrated adenoma,negative for high grade dysplasia & malignancy. rectal polyp-53mm,negative for dysplasia & malignancy.  Marland Kitchen KNEE SURGERY Left 2010  . LAPAROSCOPIC APPENDECTOMY N/A 10/30/2016   Procedure: APPENDECTOMY LAPAROSCOPIC changed  to open application of wound vac;  Surgeon: [name], MD;  Location: [facility] ORS;  Service: General;  Laterality: N/A;  . LAPAROTOMY N/A 11/11/2016   Procedure: EXPLORATORY LAPAROTOMY, DEBRIDEMENT OF ABDOMINAL WOUND, ABDOMINAL [place];  Surgeon: [name], MD;  Location: [facility] ORS;  Service: General;  Laterality: N/A;  . LAPAROTOMY N/A 11/12/2016   Procedure: EXPLORATORY LAPAROTOMY;  Surgeon: Clayburn Pert, MD;  Location: [facility] OR[facility];  Service: General;  Laterality: N/A;  . LAPAROTOMY N/A 11/14/2016   Procedure: EXPLORATORY LAPAROTOMY; Irrigation, partial closure;  Surgeon: Clayburn Pert, MD;  Location: [facility] ORS;  Service: General;  Laterality: N/A;    Physical  Exam:  GEN-  Morbidly obese, Sedated and intubated.  Non-responsive to name EARS-  External ears clear NOSE- clear anterior OC/OP-  ETT in place and secure NECK-  Limited landmarks inferiorly due to extensive adipose tissue,  thyroid notch palpated.  A/P: Respiratory failure with Need for long term ventilatory support Plan:  Tracheostomy tube candidate.  Patient is difficult due to body habitus and will possibly require assistant.  Hopefully will be able to add on for Tuesday of next week.  Will need consent.  Will also need anticoagulation and tube feeds held the night prior.   Sylvain Hasten 11/19/2016 7:40 PM

## 2016-11-19 NOTE — Consult Note (Signed)
[facility] Infectious Disease     Reason for Consult: Necrotizing fascitis    Referring Physician: Flora Lipps Date of Admission:  11/11/2016   Active Problems:   Evisceration of bowel   Necrotizing soft tissue infection   Open abdominal incision with drainage  HPI: Peter [name] is a 56 y.o. male who underwent open lap 4/28 for appendicitis readmitted 5/10 with sepsis and evisceration.  On admission wbc up to 38. He underwent surgery immediately with finding of some necrotic full tissue infection but no bowel perforation. Abd was left open and he has had multiple follow up surgeries.. He had wound vac placed and most recent surgery had good granulation tissue with no obvious infection present. He remains intubated and on low dose of pressors. Family at bedside.   Past Medical History:  Diagnosis Date  . Blood in stool   . Difficult intubation   . Glaucoma    since 2004  . Hypertension    since 2000  . Obesity, unspecified   . Ulcer 2011   gastric  . Unspecified hemorrhoids without mention of complication    Past Surgical History:  Procedure Laterality Date  . APPLICATION OF WOUND VAC N/A 11/16/2016   Procedure: APPLICATION OF WOUND VAC-ABDOMINAL;  Surgeon: Florene Glen, MD;  Location: [facility];  Service: General;  Laterality: N/A;  . APPLICATION OF WOUND VAC N/A 11/18/2016   Procedure: APPLICATION OF WOUND VAC;  Surgeon: Florene Glen, MD;  Location: [facility];  Service: General;  Laterality: N/A;  change  . CARDIAC CATHETERIZATION Left 06/23/2016   Procedure: Left Heart Cath and Coronary Angiography;  Surgeon: Nelva Bush, MD;  Location: [facility];  Service: Cardiovascular;  Laterality: Left;  . CHOLECYSTECTOMY  1992  . COLONOSCOPY W/ POLYPECTOMY  05/22/2010   40mtransverse colon polyp, traditional serrated adenoma,negative for high grade dysplasia & malignancy. rectal polyp-590mnegative for dysplasia & malignancy.  . Marland KitchenNEE SURGERY Left 2010  .  LAPAROSCOPIC APPENDECTOMY N/A 10/30/2016   Procedure: APPENDECTOMY LAPAROSCOPIC changed  to open application of wound vac;  Surgeon: DiJules HusbandsMD;  Location: [facility];  Service: General;  Laterality: N/A;  . LAPAROTOMY N/A 11/11/2016   Procedure: EXPLORATORY LAPAROTOMY, DEBRIDEMENT OF ABDOMINAL WOUND, ABDOMINAL WAOrick Surgeon: PaJules HusbandsMD;  Location: [facility];  Service: General;  Laterality: N/A;  . LAPAROTOMY N/A 11/12/2016   Procedure: EXPLORATORY LAPAROTOMY;  Surgeon: WoClayburn PertMD;  Location: [facility];  Service: General;  Laterality: N/A;  . LAPAROTOMY N/A 11/14/2016   Procedure: EXPLORATORY LAPAROTOMY; Irrigation, partial closure;  Surgeon: WoClayburn PertMD;  Location: [facility];  Service: General;  Laterality: N/A;   Social History  Substance Use Topics  . Smoking status: Never Smoker  . Smokeless tobacco: Never Used  . Alcohol use Yes     Comment: occasionally   Family History  Problem Relation Age of Onset  . Cancer Other        colon  . Hyperlipidemia Mother   . Hypertension Mother     Allergies:  Allergies  Allergen Reactions  . Isosorbide     Headache, nausea, vomiting    Current antibiotics: Antibiotics Given (last 72 hours)    Date/Time Action Medication Dose Rate   11/16/16 1532 New Bag/Given   piperacillin-tazobactam (ZOSYN) IVPB 4.5 g 4.5 g 25 mL/hr   11/16/16 2054 New Bag/Given   piperacillin-tazobactam (ZOSYN) IVPB 4.5 g 4.5 g 25 mL/hr   11/17/16 0502 New Bag/Given   piperacillin-tazobactam (ZOSYN)  IVPB 4.5 g 4.5 g 25 mL/hr   11/17/16 1523 New Bag/Given   piperacillin-tazobactam (ZOSYN) IVPB 4.5 g 4.5 g 25 mL/hr   11/17/16 2201 New Bag/Given   piperacillin-tazobactam (ZOSYN) IVPB 4.5 g 4.5 g 25 mL/hr   11/18/16 0554 New Bag/Given   piperacillin-tazobactam (ZOSYN) IVPB 4.5 g 4.5 g 25 mL/hr   11/18/16 1438 New Bag/Given   piperacillin-tazobactam (ZOSYN) IVPB 4.5 g 4.5 g 25 mL/hr   11/18/16 2130 New Bag/Given    piperacillin-tazobactam (ZOSYN) IVPB 4.5 g 4.5 g 25 mL/hr   11/19/16 0604 New Bag/Given   piperacillin-tazobactam (ZOSYN) IVPB 4.5 g 4.5 g 25 mL/hr   11/19/16 1219 New Bag/Given   clindamycin (CLEOCIN) IVPB 600 mg 600 mg 100 mL/hr      MEDICATIONS: . carbamazepine  200 mg Per J Tube [id number]   And  . carbamazepine  400 mg Per J Tube QHS  . chlorhexidine gluconate (MEDLINE KIT)  15 mL Mouth Rinse BID  . enoxaparin (LOVENOX) injection  40 mg Subcutaneous BID  . feeding supplement (PRO-STAT SUGAR FREE 64)  60 mL Per Tube TID  . feeding supplement (VITAL HIGH PROTEIN)  1,000 mL Per Tube Q24H  . insulin aspart  0-15 Units Subcutaneous Q4H  . ipratropium-albuterol  3 mL Nebulization Q6H  . lamoTRIgine  200 mg Per J Tube BID  . latanoprost  1 drop Both Eyes QHS  . mouth rinse  15 mL Mouth Rinse QID  . pantoprazole (PROTONIX) IV  40 mg Intravenous Q12H  . potassium chloride  20 mEq Oral Daily  . sertraline  200 mg Per J Tube Daily  . sodium chloride flush  10-40 mL Intracatheter Q12H    Review of Systems - unable to obtain  OBJECTIVE: Temp:  [98.8 F (37.1 C)-99.6 F (37.6 C)] 99 F (37.2 C) (05/18 0800) Pulse Rate:  [83-108] 95 (05/18 1100) Resp:  [23-25] 24 (05/18 1100) BP: (95-152)/(52-88) 104/52 (05/18 1100) SpO2:  [95 %-99 %] 95 % (05/18 1140) FiO2 (%):  [30 %] 30 % (05/18 1355) Physical Exam  Constitutional: morbidly obese, intubated, sedate.  HENT: anicteric Mouth/Throat: ETT in place Cardiovascular: Normal rate, regular rhythm and normal heart sounds.  Pulmonary/Chest: mech BS Abdominal: obese, large wound vac over abd wound, J tube in place with bilious drainage, no obvious erythema Lymphadenopathy: He has no cervical adenopathy.  Neurological: sedated  Skin: Skin is warm and dry. No rash noted. No erythema.  Psychiatric: unable to obtain    LABS: Results for orders placed or performed during the hospital encounter of 11/11/16 (from the past 48 hour(s))   Glucose, capillary     Status: Abnormal   Collection Time: 11/17/16  3:28 PM  Result Value Ref Range   Glucose-Capillary 110 (H) 65 - 99 mg/dL  Basic metabolic panel     Status: Abnormal   Collection Time: 11/17/16  5:12 PM  Result Value Ref Range   Sodium 134 (L) 135 - 145 mmol/L   Potassium 3.3 (L) 3.5 - 5.1 mmol/L   Chloride 104 101 - 111 mmol/L   CO2 26 22 - 32 mmol/L   Glucose, Bld 132 (H) 65 - 99 mg/dL   BUN 14 6 - 20 mg/dL   Creatinine, Ser 0.78 0.61 - 1.24 mg/dL   Calcium 6.9 (L) 8.9 - 10.3 mg/dL   GFR calc non Af Amer >60 >60 mL/min   GFR calc Af Amer >60 >60 mL/min    Comment: (NOTE) The eGFR has been calculated using  the CKD EPI equation. This calculation has not been validated in all clinical situations. eGFR's persistently <60 mL/min signify possible Chronic Kidney Disease.    Anion gap 4 (L) 5 - 15  Glucose, capillary     Status: Abnormal   Collection Time: 11/17/16  7:38 PM  Result Value Ref Range   Glucose-Capillary 120 (H) 65 - 99 mg/dL  Magnesium     Status: None   Collection Time: 11/17/16 10:32 PM  Result Value Ref Range   Magnesium 1.7 1.7 - 2.4 mg/dL  Potassium     Status: None   Collection Time: 11/17/16 10:32 PM  Result Value Ref Range   Potassium 3.7 3.5 - 5.1 mmol/L  Phosphorus     Status: None   Collection Time: 11/17/16 10:32 PM  Result Value Ref Range   Phosphorus 4.3 2.5 - 4.6 mg/dL  Glucose, capillary     Status: Abnormal   Collection Time: 11/17/16 11:40 PM  Result Value Ref Range   Glucose-Capillary 126 (H) 65 - 99 mg/dL  Magnesium     Status: Abnormal   Collection Time: 11/18/16  3:51 AM  Result Value Ref Range   Magnesium 1.6 (L) 1.7 - 2.4 mg/dL  Phosphorus     Status: None   Collection Time: 11/18/16  3:51 AM  Result Value Ref Range   Phosphorus 4.5 2.5 - 4.6 mg/dL  Basic metabolic panel     Status: Abnormal   Collection Time: 11/18/16  3:51 AM  Result Value Ref Range   Sodium 138 135 - 145 mmol/L   Potassium 3.5 3.5 - 5.1  mmol/L   Chloride 105 101 - 111 mmol/L   CO2 28 22 - 32 mmol/L   Glucose, Bld 111 (H) 65 - 99 mg/dL   BUN 18 6 - 20 mg/dL   Creatinine, Ser 0.92 0.61 - 1.24 mg/dL   Calcium 7.1 (L) 8.9 - 10.3 mg/dL   GFR calc non Af Amer >60 >60 mL/min   GFR calc Af Amer >60 >60 mL/min    Comment: (NOTE) The eGFR has been calculated using the CKD EPI equation. This calculation has not been validated in all clinical situations. eGFR's persistently <60 mL/min signify possible Chronic Kidney Disease.    Anion gap 5 5 - 15  CBC     Status: Abnormal   Collection Time: 11/18/16  3:51 AM  Result Value Ref Range   WBC 10.7 (H) 3.8 - 10.6 K/uL   RBC 2.90 (L) 4.40 - 5.90 MIL/uL   Hemoglobin 8.5 (L) 13.0 - 18.0 g/dL   HCT 25.0 (L) 40.0 - 52.0 %   MCV 86.2 80.0 - 100.0 fL   MCH 29.3 26.0 - 34.0 pg   MCHC 34.0 32.0 - 36.0 g/dL   RDW 13.9 11.5 - 14.5 %   Platelets 353 150 - 440 K/uL  Glucose, capillary     Status: Abnormal   Collection Time: 11/18/16  4:04 AM  Result Value Ref Range   Glucose-Capillary 104 (H) 65 - 99 mg/dL  Glucose, capillary     Status: Abnormal   Collection Time: 11/18/16  7:13 AM  Result Value Ref Range   Glucose-Capillary 104 (H) 65 - 99 mg/dL  Glucose, capillary     Status: Abnormal   Collection Time: 11/18/16 11:15 AM  Result Value Ref Range   Glucose-Capillary 126 (H) 65 - 99 mg/dL  Glucose, capillary     Status: None   Collection Time: 11/18/16  6:02 PM  Result Value  Ref Range   Glucose-Capillary 92 65 - 99 mg/dL   Comment 1 Document in Chart   Basic metabolic panel     Status: Abnormal   Collection Time: 11/18/16  6:14 PM  Result Value Ref Range   Sodium 138 135 - 145 mmol/L   Potassium 3.4 (L) 3.5 - 5.1 mmol/L   Chloride 109 101 - 111 mmol/L   CO2 25 22 - 32 mmol/L   Glucose, Bld 97 65 - 99 mg/dL   BUN 18 6 - 20 mg/dL   Creatinine, Ser 0.81 0.61 - 1.24 mg/dL   Calcium 6.8 (L) 8.9 - 10.3 mg/dL   GFR calc non Af Amer >60 >60 mL/min   GFR calc Af Amer >60 >60 mL/min     Comment: (NOTE) The eGFR has been calculated using the CKD EPI equation. This calculation has not been validated in all clinical situations. eGFR's persistently <60 mL/min signify possible Chronic Kidney Disease.    Anion gap 4 (L) 5 - 15  Magnesium     Status: None   Collection Time: 11/18/16  6:14 PM  Result Value Ref Range   Magnesium 1.9 1.7 - 2.4 mg/dL  Phosphorus     Status: None   Collection Time: 11/18/16  6:14 PM  Result Value Ref Range   Phosphorus 4.1 2.5 - 4.6 mg/dL  Glucose, capillary     Status: Abnormal   Collection Time: 11/18/16 11:57 PM  Result Value Ref Range   Glucose-Capillary 120 (H) 65 - 99 mg/dL  Glucose, capillary     Status: None   Collection Time: 11/19/16  3:55 AM  Result Value Ref Range   Glucose-Capillary 95 65 - 99 mg/dL   Comment 1 Notify RN    Comment 2 Document in Chart   Magnesium in AM     Status: None   Collection Time: 11/19/16  4:29 AM  Result Value Ref Range   Magnesium 1.9 1.7 - 2.4 mg/dL  BMET in AM     Status: Abnormal   Collection Time: 11/19/16  4:29 AM  Result Value Ref Range   Sodium 139 135 - 145 mmol/L   Potassium 3.6 3.5 - 5.1 mmol/L   Chloride 106 101 - 111 mmol/L   CO2 29 22 - 32 mmol/L   Glucose, Bld 106 (H) 65 - 99 mg/dL   BUN 21 (H) 6 - 20 mg/dL   Creatinine, Ser 0.82 0.61 - 1.24 mg/dL   Calcium 7.4 (L) 8.9 - 10.3 mg/dL   GFR calc non Af Amer >60 >60 mL/min   GFR calc Af Amer >60 >60 mL/min    Comment: (NOTE) The eGFR has been calculated using the CKD EPI equation. This calculation has not been validated in all clinical situations. eGFR's persistently <60 mL/min signify possible Chronic Kidney Disease.    Anion gap 4 (L) 5 - 15  CBC     Status: Abnormal   Collection Time: 11/19/16  4:29 AM  Result Value Ref Range   WBC 9.3 3.8 - 10.6 K/uL   RBC 3.11 (L) 4.40 - 5.90 MIL/uL   Hemoglobin 9.2 (L) 13.0 - 18.0 g/dL   HCT 27.0 (L) 40.0 - 52.0 %   MCV 86.8 80.0 - 100.0 fL   MCH 29.5 26.0 - 34.0 pg   MCHC 34.0  32.0 - 36.0 g/dL   RDW 13.8 11.5 - 14.5 %   Platelets 316 150 - 440 K/uL  Phosphorus     Status: None   Collection  Time: 11/19/16  4:29 AM  Result Value Ref Range   Phosphorus 4.1 2.5 - 4.6 mg/dL  Albumin     Status: Abnormal   Collection Time: 11/19/16  4:29 AM  Result Value Ref Range   Albumin 1.2 (L) 3.5 - 5.0 g/dL  Glucose, capillary     Status: None   Collection Time: 11/19/16  7:39 AM  Result Value Ref Range   Glucose-Capillary 87 65 - 99 mg/dL  Glucose, capillary     Status: None   Collection Time: 11/19/16 12:03 PM  Result Value Ref Range   Glucose-Capillary 98 65 - 99 mg/dL   No components found for: ESR, C REACTIVE PROTEIN MICRO: Recent Results (from the past 720 hour(s))  CULTURE, BLOOD (ROUTINE X 2) w Reflex to ID Panel     Status: None   Collection Time: 11/01/16  7:50 PM  Result Value Ref Range Status   Specimen Description BLOOD LT [facility]  Final   Special Requests Blood Culture adequate volume  Final   Culture NO GROWTH 5 DAYS  Final   Report Status 11/06/2016 FINAL  Final  CULTURE, BLOOD (ROUTINE X 2) w Reflex to ID Panel     Status: None   Collection Time: 11/01/16  8:04 PM  Result Value Ref Range Status   Specimen Description BLOOD LEFT FOREARM  Final   Special Requests Blood Culture adequate volume  Final   Culture NO GROWTH 5 DAYS  Final   Report Status 11/06/2016 FINAL  Final  Aerobic/Anaerobic Culture (surgical/deep wound)     Status: None (Preliminary result)   Collection Time: 11/11/16  7:43 AM  Result Value Ref Range Status   Specimen Description TISSUE ABDOMEN  Final   Special Requests NONE  Final   Gram Stain   Final    FEW WBC PRESENT,BOTH PMN AND MONONUCLEAR NO ORGANISMS SEEN    Culture   Final    FEW KLEBSIELLA OXYTOCA RARE ENTEROBACTER CLOACAE RARE ACTINOMYCES SPECIES Standardized susceptibility testing for this organism is not available. MODERATE BACTEROIDES FRAGILIS BETA LACTAMASE NEGATIVE Performed at [facility], [place]  [address], [place], [place] [address]    Report Status PENDING  Incomplete   Organism ID, Bacteria KLEBSIELLA OXYTOCA  Final   Organism ID, Bacteria ENTEROBACTER CLOACAE  Final      Susceptibility   Enterobacter cloacae - MIC*    CEFAZOLIN >=64 RESISTANT Resistant     CEFEPIME <=1 SENSITIVE Sensitive     CEFTAZIDIME <=1 SENSITIVE Sensitive     CEFTRIAXONE <=1 SENSITIVE Sensitive     CIPROFLOXACIN <=0.25 SENSITIVE Sensitive     GENTAMICIN <=1 SENSITIVE Sensitive     IMIPENEM <=0.25 SENSITIVE Sensitive     TRIMETH/SULFA <=20 SENSITIVE Sensitive     PIP/TAZO <=4 SENSITIVE Sensitive     * RARE ENTEROBACTER CLOACAE   Klebsiella oxytoca - MIC*    AMPICILLIN >=32 RESISTANT Resistant     CEFAZOLIN 8 SENSITIVE Sensitive     CEFEPIME <=1 SENSITIVE Sensitive     CEFTAZIDIME <=1 SENSITIVE Sensitive     CEFTRIAXONE <=1 SENSITIVE Sensitive     CIPROFLOXACIN <=0.25 SENSITIVE Sensitive     GENTAMICIN <=1 SENSITIVE Sensitive     IMIPENEM <=0.25 SENSITIVE Sensitive     TRIMETH/SULFA <=20 SENSITIVE Sensitive     AMPICILLIN/SULBACTAM 8 SENSITIVE Sensitive     PIP/TAZO <=4 SENSITIVE Sensitive     Extended ESBL NEGATIVE Sensitive     * FEW KLEBSIELLA OXYTOCA  MRSA PCR Screening  Status: None   Collection Time: 11/11/16 10:00 AM  Result Value Ref Range Status   MRSA by PCR NEGATIVE NEGATIVE Final    Comment:        The GeneXpert MRSA Assay (FDA approved for NASAL specimens only), is one component of a comprehensive MRSA colonization surveillance program. It is not intended to diagnose MRSA infection nor to guide or monitor treatment for MRSA infections.   CULTURE, BLOOD (ROUTINE X 2) w Reflex to ID Panel     Status: None   Collection Time: 11/11/16  3:29 PM  Result Value Ref Range Status   Specimen Description BLOOD R AC  Final   Special Requests BOTTLES DRAWN AEROBIC AND ANAEROBIC BCAV  Final   Culture NO GROWTH 5 DAYS  Final   Report Status 11/16/2016 FINAL  Final  CULTURE, BLOOD (ROUTINE  X 2) w Reflex to ID Panel     Status: None   Collection Time: 11/11/16  3:29 PM  Result Value Ref Range Status   Specimen Description BLOOD R HAND  Final   Special Requests BOTTLES DRAWN AEROBIC AND ANAEROBIC BCAV  Final   Culture NO GROWTH 5 DAYS  Final   Report Status 11/16/2016 FINAL  Final  Culture, respiratory (NON-Expectorated)     Status: None   Collection Time: 11/14/16  3:00 PM  Result Value Ref Range Status   Specimen Description TRACHEAL ASPIRATE  Final   Special Requests NONE  Final   Gram Stain   Final    RARE WBC PRESENT, PREDOMINANTLY PMN RARE YEAST Performed at [facility], [place] [address], [place], [place] [address]    Culture FEW CANDIDA ALBICANS  Final   Report Status 11/16/2016 FINAL  Final    IMAGING: Dg Chest 1 View  Result Date: 11/15/2016 CLINICAL DATA:  Shortness of breath.  Intubation. EXAM: CHEST 1 VIEW COMPARISON:  11/14/2016. FINDINGS: Endotracheal tube, left IJ line stable position. Heart size normal. Low lung volumes with persistent bibasilar atelectasis/infiltrates. No significant change from prior exam. No pneumothorax. IMPRESSION: 1. Endotracheal tube and left IJ line stable position. 2. Low lung volumes with persistent bibasilar atelectasis/ infiltrates. No interim change. Electronically Signed   By: Marcello Moores  Register   On: 11/15/2016 06:31   Dg Chest 1 View  Result Date: 11/14/2016 CLINICAL DATA:  Routine chest x-ray.  Shortness of breath. EXAM: CHEST 1 VIEW COMPARISON:  Nov 11, 2016 FINDINGS: The ETT is in good position. The NG tube terminates below today's film. No pneumothorax. Increased opacity in medial right lung base. Increased opacity in the left retrocardiac region. No other interval changes. IMPRESSION: 1. The ETT is in good position. 2. Increased opacity in the medial right lung base could represent atelectasis or developing infiltrate. 3. Increasing opacity in the left retrocardiac region could also represent atelectasis or infiltrate.  Recommend follow-up to resolution. Electronically Signed   By: Dorise Bullion III M.D   On: 11/14/2016 07:16   Dg Abd 1 View  Result Date: 11/19/2016 CLINICAL DATA:  Abdominal distention. EXAM: ABDOMEN - 1 VIEW COMPARISON:  11/15/2016 FINDINGS: There is only minimal air in the bowel. No appreciable distended bowel loops. Bone structures are normal. IMPRESSION: Benign-appearing abdomen.  Minimal residual air in the bowel. Electronically Signed   By: Lorriane Shire M.D.   On: 11/19/2016 11:37   Dg Abd 1 View  Result Date: 11/11/2016 CLINICAL DATA:  Evaluate OG tube placement. EXAM: ABDOMEN - 1 VIEW COMPARISON:  None. FINDINGS: The OG tube terminates  within the stomach. IMPRESSION: The OG tube terminates in the stomach. Electronically Signed   By: Dorise Bullion III M.D   On: 11/11/2016 17:26   Ct Abdomen Pelvis W Contrast  Result Date: 11/11/2016 CLINICAL DATA:  55 y/o M; right flank and abdominal pain. Appendectomy 10 days ago. EXAM: CT ABDOMEN AND PELVIS WITH CONTRAST TECHNIQUE: Multidetector CT imaging of the abdomen and pelvis was performed using the standard protocol following bolus administration of intravenous contrast. CONTRAST:  100 cc Isovue 3 7 COMPARISON:  None. FINDINGS: Lower chest: Stable 4-5 mm nodules along the right major fissure (series 4, image 7 and 5). Hepatobiliary: No focal liver abnormality is seen. Status post cholecystectomy. No biliary dilatation. Pancreas: Unremarkable. No pancreatic ductal dilatation or surrounding inflammatory changes. Spleen: Normal in size without focal abnormality. Adrenals/Urinary Tract: Left adrenal scarring probably representing sequelae of prior infection or hemorrhage. Nonspecific 13 mm hypodense focus within the left adrenal gland measures 8 Hounsfield units on the coronal reconstruction (series 5, image 65) and may represent an adenoma or possibly scarring. Stable left kidney parapelvic cysts. No hydronephrosis or obstructive uropathy. Normal  bladder. Stomach/Bowel: Open ventral lower abdominal wall an broad-based left lower quadrant herniation in the abdominal wall measuring approximately 13 x 15 cm (ML by CC). Through the herniation extending into the open wound I loops of small and large bowel which demonstrate wall thickening and surrounding inflammatory changes. There is proximal mild small bowel obstruction. Additionally, there is fluid and small foci of air within the deep portion of the abdominal wound near the pubic symphysis without Organization into a discrete abscess. In the right lower quadrant appendectomy bed there is stranding of fat without a discrete fluid collection which may represent phlegmonous or postsurgical changes. Vascular/Lymphatic: No significant vascular findings are present. No enlarged abdominal or pelvic lymph nodes. Reproductive: Central prostatic calcification. Other: Small volume of peritoneal ascites. Musculoskeletal: No fracture is seen. IMPRESSION: 1. Open ventral lower abdominal wall wound and broad-based left lower quadrant abdominal wall hernia at the deep margin of the open wound measuring approximately 13 x 15 cm (ML by CC). Through the herniation extending into the open wound are loops of small and large bowel which demonstrate wall thickening and surrounding inflammatory changes. There is proximal mild small bowel obstruction. 2. Fat stranding in the right lower quadrant appendectomy bed may represent phlegmonous or postsurgical changes. No peritoneal abscess identified. 3. Left adrenal hypodense nodule measures 8 Hounsfield units, probably an adenoma or scarring. 4. 4-5 mm pulmonary nodules along right major fissure. No follow-up needed if patient is low-risk (and has no known or suspected primary neoplasm). Non-contrast chest CT can be considered in 12 months if patient is high-risk. This recommendation follows the consensus statement: Guidelines for Management of Incidental Pulmonary Nodules Detected on CT  Images: From the Fleischner Society 2017; Radiology 2017; 284:228-243. Electronically Signed   By: Kristine Garbe M.D.   On: 11/11/2016 06:05   Ct Abdomen Pelvis W Contrast  Result Date: 10/30/2016 CLINICAL DATA:  56 year old male with abdominal pain, nausea vomiting. EXAM: CT ABDOMEN AND PELVIS WITH CONTRAST TECHNIQUE: Multidetector CT imaging of the abdomen and pelvis was performed using the standard protocol following bolus administration of intravenous contrast. CONTRAST:  141m ISOVUE-300 IOPAMIDOL (ISOVUE-300) INJECTION 61% COMPARISON:  None. FINDINGS: Lower chest: There is mild eventration of the right hemidiaphragm with minimal bibasilar linear atelectasis/ scarring. A 5 mm subpleural nodule noted along the right major fissure (series 2 image 3). There is no intra-abdominal free  air or free fluid. Hepatobiliary: Cholecystectomy. There is mild intrahepatic biliary ductal dilatation, likely post cholecystectomy. There is normal tapering of the CBD at the head of the pancreas. No retained calcified CBD stones noted. The liver is unremarkable. Pancreas: Unremarkable. No pancreatic ductal dilatation or surrounding inflammatory changes. Spleen: Normal in size without focal abnormality. Adrenals/Urinary Tract: There is linear calcification of the left adrenal gland, likely sequela of prior infection or hemorrhage. A 13 mm ill-defined hypodense nodule in the left adrenal gland is not well characterized but may represent an adenoma or scarring related to prior insult. MRI may provide better characterisation of the adrenal gland. The right adrenal gland appears unremarkable. There is a 15 mm left renal parapelvic cyst. The kidneys are otherwise unremarkable. There is homogeneous and symmetric uptake and enhancement of the kidneys and excretion of the contrast into the collecting system bilaterally. The visualized ureters and urinary bladder appear unremarkable. Stomach/Bowel: There is diverticulosis of  the colon primarily involving the transverse colon. No active inflammatory changes. There is abutment of a short segment of small bowel to the transverse colon likely representing adhesions, likely sequela of prior diverticulitis. There is no evidence of bowel obstruction. The appendix is enlarged and inflamed. Multiple appendicolith noted within the lumen of the appendix. The appendix is located in the right lower quadrant medial to the cecum. There is no drainable fluid collection/abscess or evidence of perforation. Vascular/Lymphatic: No significant vascular findings are present. No enlarged abdominal or pelvic lymph nodes. Reproductive: The prostate and seminal vesicles are grossly unremarkable. Other: No abdominal wall hernia or abnormality. No abdominopelvic ascites. Musculoskeletal: Degenerative changes of the spine. L5-S1 disc desiccation with vacuum phenomena and facet arthropathy. No acute fracture. IMPRESSION: 1. Acute appendicitis.  No abscess or evidence of perforation. 2. Colonic diverticulosis without active inflammatory changes. No bowel obstruction. 3. Linear and coarse calcification of the left adrenal gland possibly sequela of prior insult. A 13 mm hypodense left adrenal nodule is not well characterized. MRI may provide better characterization. 4. Prior cholecystectomy. 5. A 5 mm right lung base subpleural nodule. Nonemergent CT of the chest may provide complete evaluation of the lungs if clinically indicated. Electronically Signed   By: Anner Crete M.D.   On: 10/30/2016 06:37   US Venous Img Upper Uni Right  Result Date: 11/11/2016 CLINICAL DATA:  Right arm edema. EXAM: RIGHT UPPER EXTREMITY VENOUS DOPPLER ULTRASOUND TECHNIQUE: Gray-scale sonography with graded compression, as well as color Doppler and duplex ultrasound were performed to evaluate the upper extremity deep venous system from the level of the subclavian vein and including the jugular, axillary, basilic, radial, ulnar and  upper cephalic vein. Spectral Doppler was utilized to evaluate flow at rest and with distal augmentation maneuvers. COMPARISON:  None. FINDINGS: Contralateral Subclavian Vein: Respiratory phasicity is normal and symmetric with the symptomatic side. No evidence of thrombus. Normal compressibility. Internal Jugular Vein: No evidence of thrombus. Normal compressibility, respiratory phasicity and response to augmentation. Subclavian Vein: No evidence of thrombus. Normal compressibility, respiratory phasicity and response to augmentation. Axillary Vein: No evidence of thrombus. Normal compressibility, respiratory phasicity and response to augmentation. Cephalic Vein: Nonocclusive thrombus is identified in the cephalic vein above the antecubital fossa. Basilic Vein: No evidence of thrombus. Normal compressibility, respiratory phasicity and response to augmentation. Brachial Veins: No evidence of thrombus. Normal compressibility, respiratory phasicity and response to augmentation. Radial Veins: No evidence of thrombus. Normal compressibility, respiratory phasicity and response to augmentation. Ulnar Veins: No evidence of thrombus. Normal compressibility, respiratory  phasicity and response to augmentation. Venous Reflux:  None visualized. Other Findings:  No abnormal fluid collections. IMPRESSION: No evidence of DVT within the right upper extremity. There is evidence of superficial thrombophlebitis with nonocclusive thrombus identified within a segment of the cephalic vein above the antecubital fossa. Electronically Signed   By: Irish Lack M.D.   On: 11/11/2016 17:17   Dg Chest Port 1 View  Result Date: 11/11/2016 CLINICAL DATA:  Endotracheal tube placement and central line placement. EXAM: PORTABLE CHEST 1 VIEW COMPARISON:  Radiographs of November 01, 2016. FINDINGS: Stable cardiomegaly. Hypoinflation of the lungs is noted. Stable bibasilar subsegmental atelectasis. Endotracheal tube is seen projected over tracheal  air shadow with distal tip 4 cm above the carina. Left internal jugular catheter is noted with distal tip in expected position of left brachycephalic vein. No pneumothorax or pleural effusion is noted. Bony thorax is unremarkable. IMPRESSION: Endotracheal tube and left internal jugular catheter in grossly good position. Stable bibasilar subsegmental atelectasis. Hypoinflation of the lungs is noted. Electronically Signed   By: Lupita Raider, M.D.   On: 11/11/2016 12:33   Dg Chest Port 1 View  Result Date: 11/01/2016 CLINICAL DATA:  Postoperative fever EXAM: PORTABLE CHEST 1 VIEW COMPARISON:  08/19/2015 chest radiograph. FINDINGS: Low lung volumes. Stable cardiomediastinal silhouette with top-normal heart size. No pneumothorax. No pleural effusion. Patchy bibasilar lung opacities, left greater than right. IMPRESSION: Low lung volumes. Nonspecific patchy bibasilar lung opacities, left greater than right, favor atelectasis, difficult to exclude a component of aspiration or pneumonia. Electronically Signed   By: Delbert Phenix M.D.   On: 11/01/2016 21:23   Dg Abd Portable 1v  Result Date: 11/15/2016 CLINICAL DATA:  Open abdominal incision with drainage. EXAM: PORTABLE ABDOMEN - 1 VIEW COMPARISON:  11/11/2016.  CT 11/11/2016. FINDINGS: Interim removal of NG tube. Surgical clips right upper quadrant. Soft tissue structures are unremarkable. Interim improvement of bowel distention. No free air. Degenerative changes lumbar spine and both hips. Pelvic calcifications consistent phleboliths. Basilar subsegmental atelectasis. IMPRESSION: Interim removal of NG tube. Interim improvement of bowel distention. No free air. Electronically Signed   By: Maisie Fus  Register   On: 11/15/2016 06:35   Dg Basil Dess Tube Plc W/fl W/rad  Result Date: 11/15/2016 CLINICAL DATA:  Jejunal tube evaluation EXAM: NASO G TUBE PLACEMENT WITH FL AND WITH RAD CONTRAST:  10mL Isovue 300 FLUOROSCOPY TIME:  Fluoroscopy Time:  0.3 minutes Radiation  Exposure Index (if provided by the fluoroscopic device): 8.3 mGy Number of Acquired Spot Images: 0 COMPARISON:  None. FINDINGS: The patient as a percutaneous enteric feeding tube with 1 limb extending into the stomach and a second terminating in the jejunum. The port labeled ' Feeding Tube' was hand injected with iodinated contrast with opacification of the duodenojejunal flexure. Patient tolerated the procedure well.  No immediate complications. IMPRESSION: The port labeled ' Feeding Tube' was hand injected with iodinated contrast with opacification of the duodenojejunal flexure confirming at the tip of the 'Feeding Tube' portion of the enteric tube is within the distal duodenum/proximal jejunum. Electronically Signed   By: Elige Ko   On: 11/15/2016 16:01    Assessment:   Peter [name] is a 56 y.o. male who underwent open lap 4/28 for appendicitis readmitted 5/10 with sepsis and evisceration.  On admission wbc up to 38. He underwent surgery immediately with finding of some necrotic full tissue infection but no bowel perforation. He has had multiple surgeries but most recently has no evidence of continued  or progressive necrosis. Cultures mixed with Klebsiella, enterobacter, actinomyces, bacteroides.  He has a wound vac in place and a J tube. Will need long term IV abx so discussed with Dr Mortimer Fries PICC placement. Discussed need for long term abx with his family as well.   Recommendations Continue IV Zosyn Place PICC Continue wound care.  Monitor for development of abd abscesses. Thank you very much for allowing me to participate in the care of this patient. Please call with questions.   Cheral Marker. Ola Spurr, MD

## 2016-11-19 NOTE — Progress Notes (Signed)
[facility] Progess Note    SYNOPSIS   This is a 56 year old morbidly obese male with a history of open appendectomy on 4/28. Readmitted with evisceration with necrotizing fasciitis with septic shock. Now status post laparotomy, debridement, multiple washouts. Overall, prognosis is guarded.   ASSESSMENT/PLAN   PULMONARY A: Acute vent-dependent respiratory failure.  Vent settings as below; Intubated 5/10 P:   -Continue current vent settings as below. -No plan for extubation as the patient is  to go back to the OR today or tomorrow for another washout.  Tentative plan for transfer to [facility] for surgery to abdomen-- reconstruction due to extensive debridement-follow up surgery recs.  --Will likely require tracheostomy either before or after transfer to [facility]   VENTILATOR SETTINGS: Vent Mode: PRVC FiO2 (%):  [30 %-70 %] 35 % Set Rate:  [24 bmp] 24 bmp Vt Set:  [500 mL] 500 mL PEEP:  [5 cmH20] 5 cmH20  CARDIOVASCULAR A: Continued septic shock with hypotension. -Currently on Levophed at 3 mics. P:  Continue to Wean down pressors as tolerated.   HEMODYNAMICS: CVP:  [5 mmHg-11 mmHg] 7 mmHg  RENAL A: -- Hypokalemia, replaced. Watch UOP  INTAKE / OUTPUT:  Intake/Output Summary (Last 24 hours) at 11/15/16 0748 Last data filed at 11/15/16 0600  Gross per 24 hour  Intake          7853.74 ml  Output             4623 ml  Net          3230.74 ml    GASTROINTESTINAL A:   P:   Continue protonix.  Feeds per G tube.   HEMATOLOGIC A:  Leukocytosis, likely reactive to sepsis.  P:  Continue to monitor.   INFECTIOUS A:  Septic shock secondary to necrotizing fasciitis of the abdomen, status post washout. P:   Continue antibiotics, continue management per surgery.  Micro/culture results: MRSA PCR 5/10, negative BCx2 5/10, negative Surgical wound culture 5/10: Klebsiella oxytoca. Sputum: Pending  Antibiotics: Zosyn 5/10>>  ENDOCRINE A:   Controlled blood glucose. P:   -Appears controlled with sliding scale insulin.  NEUROLOGIC A:  Metabolic encephalopathy, secondary to sepsis P:   RASS goal: -1   SIGNIFICANT EVENTS: 05/10-Pt admitted to ICU intubated post/op  LINES/TUBES: ETT 05/10>> Left IJ CVL 05/10>>  Best Practices  DVT Prophylaxis: Enoxaparin GI Prophylaxis: Protonix    ---------------------------------------   ----------------------------------------   Name: Peter [name] MRN: [id number] DOB: 17-Aug-1960    ADMISSION DATE:  11/11/2016    SUBJECTIVE:   Pt currently on the ventilator, still can not provide history or review of systems.  Remains critically ill   Review of Systems:  Critically ill, ROS unobtainable  VITAL SIGNS: Temp:  [98.5 F (36.9 C)-99.6 F (37.6 C)] 99.2 F (37.3 C) (05/18 0400) Pulse Rate:  [83-108] 97 (05/18 0600) Resp:  [19-26] 24 (05/18 0600) BP: (96-155)/(54-88) 104/56 (05/18 0600) SpO2:  [95 %-100 %] 99 % (05/18 0600) FiO2 (%):  [30 %] 30 % (05/18 0349)    Physical Examination:   VS: BP (!) 104/56   Pulse 97   Temp 99.2 F (37.3 C) (Axillary)   Resp (!) 24   Ht 6' (1.829 m)   Wt (!) 309 lb (140.2 kg)   SpO2 99%   BMI 41.91 kg/m   General Appearance: intubated on Vent Neuro:without focal findings, mental status reduced HEENT: PERRLA, EOM intact. Pulmonary: Still ; Decreased air entry bilaterally.  CardiovascularNormal S1,S2.  No m/r/g.   Abdomen: Benign, Soft, non-tender. GCS<8T   LABORATORY PANEL:   CBC  Recent Labs Lab 11/19/16 0429  WBC 9.3  HGB 9.2*  HCT 27.0*  PLT 316    Chemistries   Recent Labs Lab 11/16/16 1852  11/19/16 0429  NA 134*  < > 139  K 3.5  < > 3.6  CL 101  < > 106  CO2 27  < > 29  GLUCOSE 105*  < > 106*  BUN 11  < > 21*  CREATININE 0.74  < > 0.82  CALCIUM 7.3*  < > 7.4*  MG  --   < > 1.9  PHOS  --   < > 4.1  AST 46*  --   --   ALT 24  --   --   ALKPHOS 63  --   --   BILITOT 1.3*  --   --     < > = values in this interval not displayed.   Recent Labs Lab 11/18/16 0713 11/18/16 1115 11/18/16 1802 11/18/16 2357 11/19/16 0355 11/19/16 0739  GLUCAP 104* 126* 92 120* 95 87    Recent Labs Lab 11/14/16 0335  PHART 7.43  PCO2ART 41  PO2ART 149*    Recent Labs Lab 11/16/16 1852  AST 46*  ALT 24  ALKPHOS 63  BILITOT 1.3*  ALBUMIN 1.4*    Cardiac Enzymes  Recent Labs Lab 11/13/16 0434  TROPONINI <0.03    RADIOLOGY:  No results found.  Imaging No results found. STUDIES:  CT Abd Pelvis 05/10>>Open ventral lower abdominal wall wound and broad-based left lower quadrant abdominal wall hernia at the deep margin of the open wound measuring approximately 13 x 15 cm (ML by CC). Through the herniation extending into the open wound are loops of small and large bowel which demonstrate wall thickening and surrounding inflammatory changes. There is proximal mild small bowel obstruction. Fat stranding in the right lower quadrant appendectomy bed may represent phlegmonous or postsurgical changes. No peritoneal abscess identified. Left adrenal hypodense nodule measures 8 Hounsfield units, probably an adenoma or scarring. 4-5 mm pulmonary nodules along right major fissure. No follow-up needed if patient is low-risk (and has no known or suspected primary neoplasm). Non-contrast chest CT can be considered in 12 months if patient is high-risk. This recommendation follows the consensus statement: Guidelines for Management of Incidental Pulmonary Nodules Detected on CT Images: From the Fleischner Society 2017; Radiology 2017; [phone]     Critical Care Time devoted to patient care services described in this note is 32 minutes.   Overall, patient is critically ill, prognosis is guarded.  Patient with Multiorgan failure and at high risk for cardiac arrest and death.    Corrin Parker, M.D.  [facility]  Medical Director [place] Director [facility] Cardio-Pulmonary Department

## 2016-11-19 NOTE — Progress Notes (Signed)
Nutrition Follow-up  DOCUMENTATION CODES:   Morbid obesity  INTERVENTION:  1. Continue TF regimen - now providing 2450 calories (124% estimated needs)  2. Electrolytes appear stable but given open abdomen continue to monitor closely and replete. Water losses from open abdomen will lead to quicker rate of electrolyte depletion.  NUTRITION DIAGNOSIS:   Inadequate oral intake related to inability to eat as evidenced by NPO status. -ongoing  GOAL:   Provide needs based on ASPEN/SCCM guidelines -exceeding  MONITOR:   I & O's, Labs, Skin, Vent status  REASON FOR ASSESSMENT:   Ventilator    ASSESSMENT:   Peter [name] is a 56 y.o. male who had a laparotomy and appendectomy 4/28 returns now with eviscerated bowel s/p ex lap, abd wash out, abd wall debridement Patient is currently intubated on ventilator support MV: 12 L/min Temp (24hrs), Avg:99 F (37.2 C), Min:98.5 F (36.9 C), Max:99.6 F (37.6 C) Propofol: 33.7 ml/hr --> 890 calories Tube feeds at goal given propofol rate. Awaiting transfer to [facility] and medications reviewed: Mg run Fentanyl gtt LR @ 60mL/hr, CBGs on the low side: 87 - 95  Diet Order:  Diet NPO time specified  Skin:  Reviewed, no issues  Last BM:  11/04/2016  Height:   Ht Readings from Last 1 Encounters:  11/11/16 6' (1.829 m)    Weight:   Wt Readings from Last 1 Encounters:  11/18/16 (!) 309 lb (140.2 kg)    Ideal Body Weight:  67.27 kg  BMI:  Body mass index is 41.91 kg/m.  Estimated Nutritional Needs:   Kcal:  6962-9528 calories  Protein:  >/= 168 gm  Fluid:  Per MD/NP/PA  EDUCATION NEEDS:   Education needs no appropriate at this time  Satira Anis. Duke Weisensel, MS, RD LDN Inpatient Clinical Dietitian Pager [phone]

## 2016-11-20 ENCOUNTER — Encounter: Payer: Self-pay | Admitting: Anesthesiology

## 2016-11-20 LAB — BASIC METABOLIC PANEL
ANION GAP: 4 — AB (ref 5–15)
BUN: 24 mg/dL — ABNORMAL HIGH (ref 6–20)
CHLORIDE: 104 mmol/L (ref 101–111)
CO2: 27 mmol/L (ref 22–32)
Calcium: 7.2 mg/dL — ABNORMAL LOW (ref 8.9–10.3)
Creatinine, Ser: 0.77 mg/dL (ref 0.61–1.24)
GFR calc Af Amer: >60 mL/min (ref >60)
Glucose, Bld: 116 mg/dL — ABNORMAL HIGH (ref 65–99)
POTASSIUM: 3.3 mmol/L — AB (ref 3.5–5.1)
SODIUM: 135 mmol/L (ref 135–145)

## 2016-11-20 LAB — GLUCOSE, CAPILLARY
GLUCOSE-CAPILLARY: 103 mg/dL — AB (ref 65–99)
GLUCOSE-CAPILLARY: 105 mg/dL — AB (ref 65–99)
Glucose-Capillary: 107 mg/dL — ABNORMAL HIGH (ref 65–99)
Glucose-Capillary: 107 mg/dL — ABNORMAL HIGH (ref 65–99)
Glucose-Capillary: 109 mg/dL — ABNORMAL HIGH (ref 65–99)
Glucose-Capillary: 120 mg/dL — ABNORMAL HIGH (ref 65–99)
Glucose-Capillary: 97 mg/dL (ref 65–99)

## 2016-11-20 LAB — AEROBIC/ANAEROBIC CULTURE W GRAM STAIN (SURGICAL/DEEP WOUND)

## 2016-11-20 LAB — AEROBIC/ANAEROBIC CULTURE (SURGICAL/DEEP WOUND)

## 2016-11-20 LAB — MAGNESIUM: MAGNESIUM: 2.2 mg/dL (ref 1.7–2.4)

## 2016-11-20 LAB — TRIGLYCERIDES: TRIGLYCERIDES: 331 mg/dL — AB (ref <150)

## 2016-11-20 MED ORDER — POTASSIUM CHLORIDE 20 MEQ/15ML (10%) PO SOLN
40.0000 meq | Freq: Once | ORAL | Status: AC
  Administered 2016-11-20: 40 meq
  Filled 2016-11-20: qty 30

## 2016-11-20 MED ORDER — PIPERACILLIN SOD-TAZOBACTAM SO 2.25 (2-0.25) G IV SOLR: 4.4000 g | Freq: Three times a day (TID) | INTRAVENOUS | Status: DC

## 2016-11-20 MED ORDER — PIPERACILLIN SOD-TAZOBACTAM SO 2.25 (2-0.25) G IV SOLR
4.5000 g | Freq: Three times a day (TID) | INTRAVENOUS | Status: DC
  Administered 2016-11-20 – 2016-11-23 (×8): 4.5 g via INTRAVENOUS
  Filled 2016-11-20 (×13): qty 4.5

## 2016-11-20 MED ORDER — ORAL CARE MOUTH RINSE
15.0000 mL | OROMUCOSAL | Status: DC
  Administered 2016-11-20 – 2016-11-23 (×34): 15 mL via OROMUCOSAL

## 2016-11-20 NOTE — Progress Notes (Signed)
Pharmacy Consult for Electrolyte Monitoring Indication: Hypokalemia/hypomagnesemia  Allergies  Allergen Reactions  . Isosorbide     Headache, nausea, vomiting    Patient Measurements: Height: 6' (182.9 cm) Weight: (!) 343 lb (155.6 kg) IBW/kg (Calculated) : 77.6  Vital Signs: Temp: 98.5 F (36.9 C) (05/19 1100) Temp Source: Axillary (05/19 1100) BP: 122/70 (05/19 1100) Pulse Rate: 102 (05/19 1100) Intake/Output from previous day: 05/18 0701 - 05/19 0700 In: 3203.2 [I.V.:1803.2; NG/GT:1000; IV Piggyback:400] Out: 5540 [Urine:1540; Drains:4000] Intake/Output from this shift: Total I/O In: 279.2 [I.V.:159.2; NG/GT:120] Out: 1030 [Urine:380; Drains:650]  Labs:  Recent Labs  11/18/16 0351 11/18/16 1814 11/19/16 0429 11/20/16 0506  WBC 10.7*  --  9.3  --   HGB 8.5*  --  9.2*  --   HCT 25.0*  --  27.0*  --   PLT 353  --  316  --   CREATININE 0.92 0.81 0.82 0.77  MG 1.6* 1.9 1.9 2.2  PHOS 4.5 4.1 4.1  --   ALBUMIN  --   --  1.2*  --    Estimated Creatinine Clearance: 154.9 mL/min (by C-G formula based on SCr of 0.77 mg/dL).  Potassium (mmol/L)  Date Value  11/20/2016 3.3 (L)   Phosphorus (mg/dL)  Date Value  11/19/2016 4.1   Calcium (mg/dL)  Date Value  11/20/2016 7.2 (L)   Albumin Corrected Calcium= 9.4 mg/dL  Assessment: 56 y/o M with ex lap appendectomy 4/28 admitted to ICU due to evisceration. Patient is currently ventilated and sedated. Patient receiving Vital High Protein at  52mL/hr.   J-tube placed 5/15. Patient with no nutrition for at least 7 days at high risk for refeeding syndrome.   Plan:  Potassium chloride 40 meq VT once in addition to scheduled K. Will recheck electrolytes with AM labs.   Pharmacy will continue to monitor and adjust per consult   Napoleon Form 11/20/2016 11:37 AM

## 2016-11-20 NOTE — Plan of Care (Signed)
Problem: Education: Goal: Knowledge of [facility] Education information/materials will improve Outcome: Not Progressing Pt. RASS -3. Responds to pain and intermittently agitated with oral care and turning.  Problem: Health Behavior/Discharge Planning: Goal: Ability to manage health-related needs will improve Outcome: Not Progressing Pt. Sedated with propofol @ 35 and fentanyl @ 200.  Unable to follow commands at this time.  Problem: Physical Regulation: Goal: Ability to maintain clinical measurements within normal limits will improve Outcome: Progressing VSS O/N Goal: Will remain free from infection Outcome: Progressing Pt. Had no s/s of infection O/N  Problem: Skin Integrity: Goal: Risk for impaired skin integrity will decrease Outcome: Not Progressing Pt. Still has NWP @ 125, scheduled to return to surgery for a change Sunday. MSAD in groin area.  Area cleansed, dried and specialty pillow case used to wick away moisture.  Problem: Activity: Goal: Risk for activity intolerance will decrease Outcome: Not Progressing Pt. Unable to follow commands at this time.  PROM completed x 2, pt. Tolerated well.  Problem: Fluid Volume: Goal: Ability to maintain a balanced intake and output will improve Outcome: Progressing UOP averaging 50-75 mL/h  Problem: Nutrition: Goal: Adequate nutrition will be maintained Outcome: Progressing VHP remains at 40 mL/h, tolerating nutrition.  Problem: Bowel/Gastric: Goal: Will not experience complications related to bowel motility Outcome: Not Progressing Pt. Had smear early AM, no BM since PTA. NP & MD aware.  Problem: Coping: Goal: Level of anxiety will decrease Outcome: Progressing Pt. Able to calm self once stimulation is removed without the need for any PRN or boluses.  Problem: Respiratory: Goal: Ability to maintain a clear airway and adequate ventilation will improve Outcome: Progressing Pt. Remains on ventilator @ 30 %,  synchrony

## 2016-11-20 NOTE — Anesthesia Preprocedure Evaluation (Addendum)
Anesthesia Evaluation  Patient identified by MRN, date of birth, ID band Patient awake    Reviewed: Allergy & Precautions, H&P , NPO status , Patient's Chart, lab work & pertinent test results, reviewed documented beta blocker date and time   History of Anesthesia Complications (+) DIFFICULT AIRWAY  Airway Mallampati: Intubated  TM Distance: >3 FB Neck ROM: full    Dental  (+) Teeth Intact   Pulmonary neg pulmonary ROS, shortness of breath, sleep apnea ,    Pulmonary exam normal        Cardiovascular hypertension, Pt. on medications negative cardio ROS Normal cardiovascular exam Rhythm:regular Rate:Normal     Neuro/Psych PSYCHIATRIC DISORDERS Depression negative neurological ROS  negative psych ROS   GI/Hepatic negative GI ROS, Neg liver ROS,   Endo/Other  negative endocrine ROS  Renal/GU negative Renal ROS  negative genitourinary   Musculoskeletal   Abdominal   Peds negative pediatric ROS (+)  Hematology negative hematology ROS (+)   Anesthesia Other Findings Past Medical History: No date: Blood in stool No date: Difficult intubation No date: Glaucoma     Comment: since 2004 No date: Hypertension     Comment: since 2000 No date: Obesity, unspecified 2011: Ulcer     Comment: gastric No date: Unspecified hemorrhoids without mention of com* Past Surgical History: 01/10/6282: APPLICATION OF WOUND VAC N/A     Comment: Procedure: APPLICATION OF WOUND VAC-ABDOMINAL;              Surgeon: Florene Glen, MD;  Location: [facility]              ORS;  Service: General;  Laterality: N/A; 06/23/2016: CARDIAC CATHETERIZATION Left     Comment: Procedure: Left Heart Cath and Coronary               Angiography;  Surgeon: Nelva Bush, MD;                Location: [facility];  Service:               Cardiovascular;  Laterality: Left; 1992: CHOLECYSTECTOMY 05/22/2010: COLONOSCOPY W/ POLYPECTOMY     Comment:  98mmtransverse colon polyp, traditional               serrated adenoma,negative for high grade               dysplasia & malignancy. rectal               polyp-48mm,negative for dysplasia & malignancy. 2010: KNEE SURGERY Left 10/30/2016: LAPAROSCOPIC APPENDECTOMY N/A     Comment: Procedure: APPENDECTOMY LAPAROSCOPIC changed                to open application of wound vac;  Surgeon:               [name], MD;  Location: [facility] ORS;                Service: General;  Laterality: N/A; 11/11/2016: LAPAROTOMY N/A     Comment: Procedure: EXPLORATORY LAPAROTOMY, DEBRIDEMENT              OF ABDOMINAL WOUND, ABDOMINAL [place];                Surgeon: [name], MD;  Location: [facility]               ORS;  Service: General;  Laterality: N/A; 11/12/2016: LAPAROTOMY N/A     Comment: Procedure: EXPLORATORY LAPAROTOMY;  Surgeon:  Clayburn Pert, MD;  Location: [facility] ORS;                Service: General;  Laterality: N/A; 11/14/2016: LAPAROTOMY N/A     Comment: Procedure: EXPLORATORY LAPAROTOMY; Irrigation,              partial closure;  Surgeon: Clayburn Pert,               MD;  Location: [facility] ORS;  Service: General;                Laterality: N/A; BMI    Body Mass Index:  41.91 kg/m     Reproductive/Obstetrics negative OB ROS                             Anesthesia Physical  Anesthesia Plan  ASA: IV and emergent  Anesthesia Plan: General ETT   Post-op Pain Management:    Induction:   Airway Management Planned: Oral ETT  Additional Equipment:   Intra-op Plan:   Post-operative Plan: Possible Post-op intubation/ventilation  Informed Consent: I have reviewed the patients History and Physical, chart, labs and discussed the procedure including the risks, benefits and alternatives for the proposed anesthesia with the patient or authorized representative who has indicated his/her understanding and acceptance.   Dental Advisory Given  Plan  Discussed with: CRNA  Anesthesia Plan Comments:         Anesthesia Quick Evaluation

## 2016-11-20 NOTE — Progress Notes (Signed)
2 Days Post-Op  Subjective: Patient remains on vent stable. Wound VAC in place without leakage  Objective: Vital signs in last 24 hours: Temp:  [98.7 F (37.1 C)-100 F (37.8 C)] 98.7 F (37.1 C) (05/19 0737) Pulse Rate:  [94-108] 95 (05/19 0800) Resp:  [24-28] 24 (05/19 0800) BP: (98-144)/(52-94) 116/73 (05/19 0800) SpO2:  [95 %-99 %] 99 % (05/19 0820) FiO2 (%):  [30 %] 30 % (05/19 0900) Weight:  [343 lb (155.6 kg)] 343 lb (155.6 kg) (05/19 0350) Last BM Date:  (UTA-PTA)  Intake/Output from previous day: 05/18 0701 - 05/19 0700 In: 3203.2 [I.V.:1803.2; NG/GT:1000; IV Piggyback:400] Out: 5540 [Urine:1540; Drains:4000] Intake/Output this shift: Total I/O In: 172.9 [I.V.:92.9; NG/GT:80] Out: 380 [Urine:130; Drains:250]  Physical exam:  Morbidly obese wound VAC without leakage  Lab Results: CBC   Recent Labs  11/18/16 0351 11/19/16 0429  WBC 10.7* 9.3  HGB 8.5* 9.2*  HCT 25.0* 27.0*  PLT 353 316   BMET  Recent Labs  11/19/16 0429 11/20/16 0506  NA 139 135  K 3.6 3.3*  CL 106 104  CO2 29 27  GLUCOSE 106* 116*  BUN 21* 24*  CREATININE 0.82 0.77  CALCIUM 7.4* 7.2*   PT/INR No results for input(s): LABPROT, INR in the last 72 hours. ABG No results for input(s): PHART, HCO3 in the last 72 hours.  Invalid input(s): PCO2, PO2  Studies/Results: Dg Abd 1 View  Result Date: 11/19/2016 CLINICAL DATA:  Abdominal distention. EXAM: ABDOMEN - 1 VIEW COMPARISON:  11/15/2016 FINDINGS: There is only minimal air in the bowel. No appreciable distended bowel loops. Bone structures are normal. IMPRESSION: Benign-appearing abdomen.  Minimal residual air in the bowel. Electronically Signed   By: Lorriane Shire M.D.   On: 11/19/2016 11:37    Anti-infectives: Anti-infectives    Start     Dose/Rate Route Frequency Ordered Stop   11/19/16 1100  clindamycin (CLEOCIN) IVPB 600 mg  Status:  Discontinued     600 mg 100 mL/hr over 30 Minutes Intravenous Every 8 hours 11/19/16  1048 11/19/16 1450   11/11/16 1600  piperacillin-tazobactam (ZOSYN) IVPB 4.5 g  Status:  Discontinued     4.5 g 200 mL/hr over 30 Minutes Intravenous Every 8 hours 11/11/16 0917 11/11/16 1320   11/11/16 1500  piperacillin-tazobactam (ZOSYN) IVPB 3.375 g  Status:  Discontinued     3.375 g 12.5 mL/hr over 240 Minutes Intravenous Every 8 hours 11/11/16 0705 11/11/16 1320   11/11/16 1400  piperacillin-tazobactam (ZOSYN) IVPB 4.5 g     4.5 g 25 mL/hr over 240 Minutes Intravenous Every 8 hours 11/11/16 1320     11/11/16 0733  piperacillin-tazobactam (ZOSYN) 3.375 GM/50ML IVPB    Comments:  Veda Canning: cabinet override      11/11/16 0733 11/11/16 0743   11/11/16 0715  piperacillin-tazobactam (ZOSYN) IVPB 4.5 g  Status:  Discontinued     4.5 g 200 mL/hr over 30 Minutes Intravenous  Once 11/11/16 0705 11/16/16 0133   11/11/16 0700  piperacillin-tazobactam (ZOSYN) IVPB 3.375 g  Status:  Discontinued     3.375 g 100 mL/hr over 30 Minutes Intravenous Every 6 hours 11/11/16 0652 11/11/16 0705      Assessment/Plan: s/p Procedure(s): APPLICATION OF WOUND VAC   We'll plan wound VAC change tomorrow. Discussed trach with Dr. Pryor Ochoa. He prefers to do this on Tuesday to about the time he would require another wound VAC change and I will discuss that with Dr. Hampton Abbot.  Florene Glen, MD,  FACS  11/20/2016

## 2016-11-20 NOTE — Progress Notes (Signed)
Sedated on Fentanyl 226mcg and Propofol 5mcg/kg. WUA this am resulted in pt opening eyes, slight grips to command and wiggling left toes. He also became anxious, gagging on ETT, coughing repeatedly, red in face, grimacing, restless,  HR and RR elevated.  I have tried to keep sedation light but I am now back to the doses I started with prior to [place]. Lung sounds are diminished throughout with rhonchi in upper lung fields at times.  ETT and oral secretions are clear thick. Fio2 30%,rate 24, TV 500.   Wound vac output 342ml serosang. Gastrostomy tube to LWIS with 850ml bile output. Urine output 66ml amber. TF at 83ml/hr. Bowel sounds faint and few. No stool.  Wound vac has maintained a good seal. Consent for wound vac change in OR tomorrow obtained from pt wife.  Pt seen by Dr Mortimer Fries and Dr Burt Knack today. Wife has been at bedside most of day and has been updated.

## 2016-11-20 NOTE — Progress Notes (Signed)
[place] [facility] Progess Note    SYNOPSIS   This is a 56 year old morbidly obese male with a history of open appendectomy on 4/28. Readmitted with evisceration with necrotizing fasciitis with septic shock. Now status post laparotomy, debridement, multiple washouts. Overall, prognosis is guarded.   ASSESSMENT/PLAN   PULMONARY A: Acute vent-dependent respiratory failure.  Vent settings as below; Intubated 5/10 P:   -Continue current vent settings as below. -No plan for extubation as the patient is  to go back to the OR today or tomorrow for another washout.  Tentative plan for transfer to [facility] for surgery to abdomen-- reconstruction due to extensive debridement-follow up surgery recs.  Plan for Trach next week   VENTILATOR SETTINGS: Vent Mode: PRVC FiO2 (%):  [30 %-70 %] 35 % Set Rate:  [24 bmp] 24 bmp Vt Set:  [500 mL] 500 mL PEEP:  [5 cmH20] 5 cmH20  CARDIOVASCULAR A: Continued septic shock with hypotension. vasopressors as needed P:  Continue to Wean down pressors as tolerated.   HEMODYNAMICS: CVP:  [5 mmHg-11 mmHg] 7 mmHg  RENAL A: -- Hypokalemia, replaced. Watch UOP  GASTROINTESTINAL A:   P:   Continue protonix.  Feeds per G tube.   HEMATOLOGIC A:  Leukocytosis, likely reactive to sepsis.  P:  Continue to monitor.   INFECTIOUS A:  Septic shock secondary to necrotizing fasciitis of the abdomen, status post washout. P:   Continue antibiotics, continue management per surgery. PICC line placed for long term abx Micro/culture results: MRSA PCR 5/10, negative BCx2 5/10, negative Surgical wound culture 5/10: Klebsiella oxytoca. Sputum: Pending  Antibiotics: Zosyn 5/10>>  ENDOCRINE A:  Controlled blood glucose. P:   -Appears controlled with sliding scale insulin.  NEUROLOGIC A:  Metabolic encephalopathy, secondary to sepsis P:   RASS goal: -1   SIGNIFICANT EVENTS: 05/10-Pt admitted to ICU intubated post/op 05/11-05/18  multiple trips to OR for abd wash out and surgical debridement (5 trips to OR thus far) 5/18 PICC line placed  LINES/TUBES: ETT 05/10>> Left IJ CVL 05/10>> PICC line 5/18-->  Best Practices  DVT Prophylaxis: Enoxaparin GI Prophylaxis: Protonix    ---------------------------------------   ----------------------------------------   Name: Peter [name] MRN: [id number] DOB: [birth year]    ADMISSION DATE:  11/11/2016    SUBJECTIVE:   Pt currently on the ventilator, still can not provide history or review of systems.  Remains critically ill Massive abd wound with wound vac in place Wife updated yesterday, talked about Trach and PICC line   Review of Systems:  Critically ill, ROS unobtainable  VITAL SIGNS: Temp:  [98.7 F (37.1 C)-100 F (37.8 C)] 98.7 F (37.1 C) (05/19 0737) Pulse Rate:  [94-108] 102 (05/19 0600) Resp:  [24-28] 24 (05/19 0600) BP: (98-144)/(52-94) 120/76 (05/19 0600) SpO2:  [95 %-99 %] 97 % (05/19 0600) FiO2 (%):  [30 %] 30 % (05/19 0737) Weight:  [343 lb (155.6 kg)] 343 lb (155.6 kg) (05/19 0350)    Physical Examination:   VS: BP 120/76   Pulse (!) 102   Temp 98.7 F (37.1 C) (Axillary) Comment (Src): 97.4 oral  Resp (!) 24   Ht 6' (1.829 m)   Wt (!) 343 lb (155.6 kg)   SpO2 97%   BMI 46.52 kg/m   General Appearance: intubated on Vent Neuro:without focal findings, mental status reduced HEENT: PERRLA, EOM intact. Pulmonary: Still ; Decreased air entry bilaterally.  CardiovascularNormal S1,S2.  No m/r/g.   Abdomen:massive open wound with wound vac in place GCS<8T  LABORATORY PANEL:   CBC  Recent Labs Lab 11/19/16 0429  WBC 9.3  HGB 9.2*  HCT 27.0*  PLT 316    Chemistries   Recent Labs Lab 11/16/16 1852  11/19/16 0429 11/20/16 0506  NA 134*  < > 139 135  K 3.5  < > 3.6 3.3*  CL 101  < > 106 104  CO2 27  < > 29 27  GLUCOSE 105*  < > 106* 116*  BUN 11  < > 21* 24*  CREATININE 0.74  < > 0.82 0.77  CALCIUM  7.3*  < > 7.4* 7.2*  MG  --   < > 1.9 2.2  PHOS  --   < > 4.1  --   AST 46*  --   --   --   ALT 24  --   --   --   ALKPHOS 63  --   --   --   BILITOT 1.3*  --   --   --   < > = values in this interval not displayed.   Recent Labs Lab 11/19/16 1203 11/19/16 1556 11/19/16 2004 11/20/16 0006 11/20/16 0411 11/20/16 0754  GLUCAP 98 95 111* 97 107* 107*    Recent Labs Lab 11/14/16 0335  PHART 7.43  PCO2ART 41  PO2ART 149*    Recent Labs Lab 11/16/16 1852 11/19/16 0429  AST 46*  --   ALT 24  --   ALKPHOS 63  --   BILITOT 1.3*  --   ALBUMIN 1.4* 1.2*    Cardiac Enzymes No results for input(s): TROPONINI in the last 168 hours.  RADIOLOGY:  Dg Abd 1 View  Result Date: 11/19/2016 CLINICAL DATA:  Abdominal distention. EXAM: ABDOMEN - 1 VIEW COMPARISON:  11/15/2016 FINDINGS: There is only minimal air in the bowel. No appreciable distended bowel loops. Bone structures are normal. IMPRESSION: Benign-appearing abdomen.  Minimal residual air in the bowel. Electronically Signed   By: Lorriane Shire M.D.   On: 11/19/2016 11:37    Critical Care Time devoted to patient care services described in this note is 35 minutes.   Overall, patient is critically ill, prognosis is guarded.  Patient with Multiorgan failure and at high risk for cardiac arrest and death.    Corrin Parker, M.D.  [facility]  Medical Director [place] Director [facility] Cardio-Pulmonary Department

## 2016-11-21 ENCOUNTER — Encounter: Payer: Self-pay | Admitting: *Deleted

## 2016-11-21 ENCOUNTER — Inpatient Hospital Stay: Payer: 59 | Admitting: Anesthesiology

## 2016-11-21 ENCOUNTER — Encounter
Admission: EM | Disposition: A | Discharge status: Other Acute Inpatient Hospital | Payer: Self-pay | Source: Home / Self Care | Attending: Surgery

## 2016-11-21 DIAGNOSIS — T8131XS Disruption of external operation (surgical) wound, not elsewhere classified, sequela: Secondary | ICD-10-CM

## 2016-11-21 HISTORY — PX: DRESSING CHANGE UNDER ANESTHESIA: SHX5237

## 2016-11-21 LAB — GLUCOSE, CAPILLARY
GLUCOSE-CAPILLARY: 100 mg/dL — AB (ref 65–99)
GLUCOSE-CAPILLARY: 105 mg/dL — AB (ref 65–99)
GLUCOSE-CAPILLARY: 116 mg/dL — AB (ref 65–99)
Glucose-Capillary: 88 mg/dL (ref 65–99)
Glucose-Capillary: 92 mg/dL (ref 65–99)
Glucose-Capillary: 104 mg/dL — ABNORMAL HIGH (ref 65–99)

## 2016-11-21 LAB — BASIC METABOLIC PANEL
Anion gap: 4 — ABNORMAL LOW (ref 5–15)
BUN: 28 mg/dL — ABNORMAL HIGH (ref 6–20)
CHLORIDE: 111 mmol/L (ref 101–111)
CO2: 29 mmol/L (ref 22–32)
CREATININE: 0.73 mg/dL (ref 0.61–1.24)
Calcium: 7.7 mg/dL — ABNORMAL LOW (ref 8.9–10.3)
GFR calc non Af Amer: >60 mL/min (ref >60)
Glucose, Bld: 93 mg/dL (ref 65–99)
POTASSIUM: 3.6 mmol/L (ref 3.5–5.1)
Sodium: 144 mmol/L (ref 135–145)

## 2016-11-21 LAB — PHOSPHORUS: PHOSPHORUS: 3.5 mg/dL (ref 2.5–4.6)

## 2016-11-21 LAB — MAGNESIUM: MAGNESIUM: 2 mg/dL (ref 1.7–2.4)

## 2016-11-21 SURGERY — REPLACEMENT, DRESSING, WITH ANESTHESIA
Anesthesia: General

## 2016-11-21 MED ORDER — ONDANSETRON HCL 4 MG/2ML IJ SOLN: 4.0000 mg | Freq: Once | INTRAMUSCULAR | Status: DC | PRN

## 2016-11-21 MED ORDER — SODIUM CHLORIDE 0.9 % IV SOLN
INTRAVENOUS | Status: DC | PRN
  Administered 2016-11-21: 12:00:00 via INTRAVENOUS

## 2016-11-21 MED ORDER — PROPOFOL 500 MG/50ML IV EMUL
INTRAVENOUS | Status: DC | PRN
  Administered 2016-11-21: 30 ug/kg/min via INTRAVENOUS

## 2016-11-21 MED ORDER — SODIUM CHLORIDE 0.9 % IV SOLN
INTRAVENOUS | Status: DC | PRN
  Administered 2016-11-21: 200 ug/h via INTRAVENOUS

## 2016-11-21 MED ORDER — FENTANYL CITRATE (PF) 100 MCG/2ML IJ SOLN: 25.0000 ug | INTRAMUSCULAR | Status: DC | PRN

## 2016-11-21 SURGICAL SUPPLY — 14 items
CANISTER SUCT 1200ML W/VALVE (MISCELLANEOUS) ×2 IMPLANT
DRAPE INCISE IOBAN 66X45 STRL (DRAPES) ×2 IMPLANT
DRAPE LAPAROTOMY 100X77 ABD (DRAPES) ×2 IMPLANT
GAUZE SPONGE 4X4 12PLY STRL (GAUZE/BANDAGES/DRESSINGS) IMPLANT
GLOVE INDICATOR 7.5 STRL GRN (GLOVE) ×2 IMPLANT
GLOVE SURG LX 7.5 STRW (GLOVE) ×3
GLOVE SURG LX STRL 7.5 STRW (GLOVE) ×3 IMPLANT
GOWN STRL REUS W/ TWL LRG LVL3 (GOWN DISPOSABLE) ×2 IMPLANT
GOWN STRL REUS W/TWL LRG LVL3 (GOWN DISPOSABLE) ×2
KIT RM TURNOVER STRD PROC AR (KITS) ×2 IMPLANT
NS IRRIG 1000ML POUR BTL (IV SOLUTION) ×2 IMPLANT
PACK BASIN MINOR ARMC (MISCELLANEOUS) ×2 IMPLANT
SPONGE ABDOMINAL VAC ABTHERA (MISCELLANEOUS) ×2 IMPLANT
WND VAC CANISTER 500ML (MISCELLANEOUS) ×2 IMPLANT

## 2016-11-21 NOTE — Progress Notes (Signed)
[place] [facility] Progess Note  SYNOPSIS   This is a 56 year old morbidly obese male with a history of open appendectomy on 4/28. Readmitted with evisceration with necrotizing fasciitis with septic shock. Now status post laparotomy, debridement, multiple washouts. Overall, prognosis is guarded.   ASSESSMENT/PLAN   PULMONARY A: Acute vent-dependent respiratory failure.  Vent settings as below; Intubated 5/10 P:   -Continue current vent settings as below. -No plan for extubation as the patient is  to go back to the OR today or tomorrow for another washout.  Tentative plan for transfer to [facility] for surgery to abdomen-- reconstruction due to extensive debridement-follow up surgery recs.  Plan for Trach next week   VENTILATOR SETTINGS: Vent Mode: PRVC FiO2 (%):  [30 %-70 %] 35 % Set Rate:  [24 bmp] 24 bmp Vt Set:  [500 mL] 500 mL PEEP:  [5 cmH20] 5 cmH20  CARDIOVASCULAR A: Continued septic shock with hypotension. vasopressors as needed P:  Continue to Wean down pressors as tolerated.   HEMODYNAMICS: CVP:  [5 mmHg-11 mmHg] 7 mmHg  RENAL A: -- Hypokalemia, replaced. Watch UOP  GASTROINTESTINAL A:   P:   Continue protonix.  Feeds per G tube.   HEMATOLOGIC A:  Leukocytosis, likely reactive to sepsis.  P:  Continue to monitor.   INFECTIOUS A:  Septic shock secondary to necrotizing fasciitis of the abdomen, status post washout. P:   Continue antibiotics, continue management per surgery. PICC line placed for long term abx Micro/culture results: MRSA PCR 5/10, negative BCx2 5/10, negative Surgical wound culture 5/10: Klebsiella oxytoca. Sputum: Pending  Antibiotics: Zosyn 5/10>>  ENDOCRINE A:  Controlled blood glucose. P:   -Appears controlled with sliding scale insulin.  NEUROLOGIC A:  Metabolic encephalopathy, secondary to sepsis P:   RASS goal: -1  LINES/TUBES: ETT 05/10>> Left IJ CVL 05/10>> PICC line 5/18-->  Best  Practices  DVT Prophylaxis: Enoxaparin GI Prophylaxis: Protonix      ---------------------------------------   ----------------------------------------   Name: Peter [name] MRN: [id number] DOB: [birth year]    ADMISSION DATE:  11/11/2016  SIGNIFICANT EVENTS: 05/10-Pt admitted to ICU intubated post/op 05/11-05/18 multiple trips to OR for abd wash out and surgical debridement (5 trips to OR thus far) 5/18 PICC line placed 5/20-plan for OR today for 6th wash out   SUBJECTIVE:   Pt currently on the ventilator, still can not provide history or review of systems.  Remains critically ill Massive abd wound with wound vac in place Wife updated at bedside Plan for trach this week  Review of Systems:  Critically ill, ROS unobtainable  VITAL SIGNS: Temp:  [98.3 F (36.8 C)-98.8 F (37.1 C)] 98.3 F (36.8 C) (05/20 0500) Pulse Rate:  [95-105] 97 (05/20 0600) Resp:  [20-24] 24 (05/20 0600) BP: (101-128)/(59-76) 111/70 (05/20 0600) SpO2:  [98 %-100 %] 99 % (05/20 0600) FiO2 (%):  [30 %] 30 % (05/20 0249)  Physical Examination:   VS: BP 111/70   Pulse 97   Temp 98.3 F (36.8 C) (Oral)   Resp (!) 24   Ht 6' (1.829 m)   Wt (!) 343 lb (155.6 kg)   SpO2 99%   BMI 46.52 kg/m    General Appearance: intubated on Vent Neuro:without focal findings, mental status reduced HEENT: PERRLA, EOM intact. Pulmonary: Still ; Decreased air entry bilaterally.  CardiovascularNormal S1,S2.  No m/r/g.   Abdomen:massive open wound with wound vac in place GCS<8T     LABORATORY PANEL:   CBC  Recent Labs  Lab 11/19/16 0429  WBC 9.3  HGB 9.2*  HCT 27.0*  PLT 316    Chemistries   Recent Labs Lab 11/16/16 1852  11/21/16 0503  NA 134*  < > 144  K 3.5  < > 3.6  CL 101  < > 111  CO2 27  < > 29  GLUCOSE 105*  < > 93  BUN 11  < > 28*  CREATININE 0.74  < > 0.73  CALCIUM 7.3*  < > 7.7*  MG  --   < > 2.0  PHOS  --   < > 3.5  AST 46*  --   --   ALT 24  --   --    ALKPHOS 63  --   --   BILITOT 1.3*  --   --   < > = values in this interval not displayed.   Recent Labs Lab 11/20/16 1155 11/20/16 1604 11/20/16 2001 11/21/16 0001 11/21/16 0345 11/21/16 0735  GLUCAP 120* 109* 103* 105* 100* 105*   No results for input(s): PHART, PCO2ART, PO2ART in the last 168 hours.  Recent Labs Lab 11/16/16 1852 11/19/16 0429  AST 46*  --   ALT 24  --   ALKPHOS 63  --   BILITOT 1.3*  --   ALBUMIN 1.4* 1.2*    Cardiac Enzymes No results for input(s): TROPONINI in the last 168 hours.  RADIOLOGY:  Dg Abd 1 View  Result Date: 11/19/2016 CLINICAL DATA:  Abdominal distention. EXAM: ABDOMEN - 1 VIEW COMPARISON:  11/15/2016 FINDINGS: There is only minimal air in the bowel. No appreciable distended bowel loops. Bone structures are normal. IMPRESSION: Benign-appearing abdomen.  Minimal residual air in the bowel. Electronically Signed   By: Lorriane Shire M.D.   On: 11/19/2016 11:37    Critical Care Time devoted to patient care services described in this note is 34 minutes.   Overall, patient is critically ill, prognosis is guarded.  Patient with Multiorgan failure and at high risk for cardiac arrest and death.    Corrin Parker, M.D.  [facility]  Medical Director [place] Director [facility] Cardio-Pulmonary Department

## 2016-11-21 NOTE — Anesthesia Post-op Follow-up Note (Cosign Needed)
Anesthesia QCDR form completed.        

## 2016-11-21 NOTE — Progress Notes (Signed)
Pharmacy Consult for Electrolyte Monitoring Indication: Hypokalemia/hypomagnesemia  Allergies  Allergen Reactions  . Isosorbide     Headache, nausea, vomiting    Patient Measurements: Height: 6' (182.9 cm) Weight:  (bedscale broken ) IBW/kg (Calculated) : 77.6  Vital Signs: Temp: 98.2 F (36.8 C) (05/20 0800) Temp Source: Oral (05/20 0800) BP: 106/62 (05/20 0800) Pulse Rate: 98 (05/20 0800) Intake/Output from previous day: 05/19 0701 - 05/20 0700 In: 2780.1 [I.V.:1478.7; NG/GT:601.3] Out: 3350 [Urine:1350; Drains:2000] Intake/Output from this shift: Total I/O In: 217.5 [I.V.:187.5; Other:30] Out: -   Labs:  Recent Labs  11/18/16 1814 11/19/16 0429 11/20/16 0506 11/21/16 0503  WBC  --  9.3  --   --   HGB  --  9.2*  --   --   HCT  --  27.0*  --   --   PLT  --  316  --   --   CREATININE 0.81 0.82 0.77 0.73  MG 1.9 1.9 2.2 2.0  PHOS 4.1 4.1  --  3.5  ALBUMIN  --  1.2*  --   --    Estimated Creatinine Clearance: 154.9 mL/min (by C-G formula based on SCr of 0.73 mg/dL).  Potassium (mmol/L)  Date Value  11/21/2016 3.6   Phosphorus (mg/dL)  Date Value  11/21/2016 3.5   Calcium (mg/dL)  Date Value  11/21/2016 7.7 (L)   Albumin Corrected Calcium= 9.4 mg/dL  Assessment: 56 y/o M with ex lap appendectomy 4/28 admitted to ICU due to evisceration. Patient is currently ventilated and sedated. Patient receiving Vital High Protein at  67mL/hr.   J-tube placed 5/15. Patient with no nutrition for at least 7 days at high risk for refeeding syndrome.   Plan:  Will recheck labs in AM.   Pharmacy will continue to monitor and adjust per consult   Napoleon Form 11/21/2016 11:09 AM

## 2016-11-21 NOTE — Plan of Care (Signed)
Problem: Safety: Goal: Ability to remain free from injury will improve Outcome: Progressing Pt will remain injury free during hospitalization.   

## 2016-11-21 NOTE — Progress Notes (Signed)
Preoperative Review   Patient is met in the ICU. Patient's condition is discussed with his wife. The history is reviewed in the chart and with the patient. I personally reviewed the options and rationale as well as the risks of this procedure that have been previously discussed with the patient. All questions asked by the patient and/or family were answered to their satisfaction. Planning for wound VAC change later today.  Patient agrees to proceed with this procedure at this time.  Florene Glen M.D. FACS

## 2016-11-21 NOTE — Op Note (Signed)
11/11/2016 - 11/21/2016  2:11 PM  PATIENT:  Peter [name]  56 y.o. male  PRE-OPERATIVE DIAGNOSIS:  Open abdominal wound  POST-OPERATIVE DIAGNOSIS:  Same  PROCEDURE: Wound VAC change  SURGEON:  Florene Glen MD, FACS   ANESTHESIA: Gen. with endotracheal tube   Details of Procedure: This a patient with an open abdominal wound due to evisceration and loss of tissue from a necrotizing process. Consent issues were discussed with the wife as they had been on previous trips to the operating room for wound VAC change.  Under general anesthesia in the operating room a surgical cause was held. The old wound VAC was removed and the wound was inspected. There was considerably more granulation tissue especially over the subcutaneous fat that had been present really asleep. There was some seropurulent material over the bowel adhering the bowel loops together. Some of the subcutaneous tissue was lightly debrided but the granulation tissue was left in place. Care was utilized to not debride over bowel. There was no sign of fistula or leakage.   The plastic covered blue foam barrier was placed over the bowel and the blue foam was placed over that followed by the adhesive barrier. This is done in the standard fashion with an [place] utilized to isolate the J-tube. Suction was applied as typical and there was no leak.  Patient was taken to the ICU in stable condition to be admitted for continued care   Florene Glen, MD FACS

## 2016-11-21 NOTE — Anesthesia Procedure Notes (Signed)
Procedure Name: Intubation Date/Time: 11/21/2016 12:28 PM Performed by: Nelda Marseille Pre-anesthesia Checklist: Patient identified, Patient being monitored, Timeout performed, Emergency Drugs available and Suction available Patient Re-evaluated:Patient Re-evaluated prior to inductionOxygen Delivery Method: Circle system utilized Preoxygenation: Pre-oxygenation with 100% oxygen Intubation Type: Inhalational induction Tube size: 7.5 mm Secured at: 22 cm Comments: Pt transported from ICU intubated already.  Placed on ventilator for inhalational induction

## 2016-11-21 NOTE — Transfer of Care (Signed)
Immediate Anesthesia Transfer of Care Note  Patient: Peter [name]  Procedure(s) Performed: Procedure(s): DRESSING CHANGE UNDER ANESTHESIA (N/A)  Patient Location: ICU  Anesthesia Type:General  Level of Consciousness: sedated  Airway & Oxygen Therapy: Patient Spontanous Breathing, Patient remains intubated per anesthesia plan and Patient placed on Ventilator (see vital sign flow sheet for setting)  Post-op Assessment: Report given to RN and Post -op Vital signs reviewed and stable  Post vital signs: Reviewed and stable  Last Vitals:  Vitals:   11/21/16 1100 11/21/16 1200  BP: 104/61 102/69  Pulse: 97 95  Resp: (!) 24 (!) 24  Temp:      Last Pain:  Vitals:   11/21/16 0800  TempSrc: Oral  PainSc:          Complications: No apparent anesthesia complications

## 2016-11-22 ENCOUNTER — Ambulatory Visit: Payer: 59 | Admitting: Family Medicine

## 2016-11-22 ENCOUNTER — Inpatient Hospital Stay: Payer: 59

## 2016-11-22 DIAGNOSIS — R6520 Severe sepsis without septic shock: Secondary | ICD-10-CM

## 2016-11-22 DIAGNOSIS — J9600 Acute respiratory failure, unspecified whether with hypoxia or hypercapnia: Secondary | ICD-10-CM

## 2016-11-22 DIAGNOSIS — G9340 Encephalopathy, unspecified: Secondary | ICD-10-CM

## 2016-11-22 DIAGNOSIS — R52 Pain, unspecified: Secondary | ICD-10-CM

## 2016-11-22 LAB — BASIC METABOLIC PANEL
ANION GAP: 3 — AB (ref 5–15)
BUN: 32 mg/dL — ABNORMAL HIGH (ref 6–20)
CHLORIDE: 109 mmol/L (ref 101–111)
CO2: 28 mmol/L (ref 22–32)
Calcium: 7.2 mg/dL — ABNORMAL LOW (ref 8.9–10.3)
Creatinine, Ser: 0.97 mg/dL (ref 0.61–1.24)
GFR calc non Af Amer: >60 mL/min (ref >60)
Glucose, Bld: 109 mg/dL — ABNORMAL HIGH (ref 65–99)
Potassium: 3.2 mmol/L — ABNORMAL LOW (ref 3.5–5.1)
Sodium: 140 mmol/L (ref 135–145)

## 2016-11-22 LAB — GLUCOSE, CAPILLARY
GLUCOSE-CAPILLARY: 112 mg/dL — AB (ref 65–99)
GLUCOSE-CAPILLARY: 124 mg/dL — AB (ref 65–99)
Glucose-Capillary: 110 mg/dL — ABNORMAL HIGH (ref 65–99)
Glucose-Capillary: 108 mg/dL — ABNORMAL HIGH (ref 65–99)
Glucose-Capillary: 105 mg/dL — ABNORMAL HIGH (ref 65–99)

## 2016-11-22 LAB — MAGNESIUM: MAGNESIUM: 1.9 mg/dL (ref 1.7–2.4)

## 2016-11-22 MED ORDER — POTASSIUM CHLORIDE 20 MEQ/15ML (10%) PO SOLN
40.0000 meq | Freq: Once | ORAL | Status: DC
  Filled 2016-11-22: qty 30

## 2016-11-22 MED ORDER — POTASSIUM CHLORIDE 20 MEQ/15ML (10%) PO SOLN
40.0000 meq | Freq: Two times a day (BID) | ORAL | Status: DC
  Administered 2016-11-22 (×2): 40 meq
  Filled 2016-11-22 (×4): qty 30

## 2016-11-22 MED ORDER — PRO-STAT SUGAR FREE PO LIQD
30.0000 mL | Freq: Two times a day (BID) | ORAL | Status: DC
  Administered 2016-11-22 (×2): 30 mL

## 2016-11-22 MED ORDER — BISACODYL 10 MG RE SUPP
10.0000 mg | Freq: Once | RECTAL | Status: AC
  Administered 2016-11-22: 10 mg via RECTAL
  Filled 2016-11-22: qty 1

## 2016-11-22 MED ORDER — FAMOTIDINE 20 MG PO TABS
20.0000 mg | ORAL_TABLET | Freq: Two times a day (BID) | ORAL | Status: DC
Start: 2016-11-22 — End: 2016-11-23
  Administered 2016-11-22 (×2): 20 mg
  Filled 2016-11-22 (×2): qty 1

## 2016-11-22 MED ORDER — SERTRALINE HCL 100 MG PO TABS
100.0000 mg | ORAL_TABLET | Freq: Every day | ORAL | Status: DC
  Administered 2016-11-22: 100 mg via JEJUNOSTOMY
  Filled 2016-11-22: qty 1

## 2016-11-22 MED ORDER — FREE WATER
200.0000 mL | Freq: Three times a day (TID) | Status: DC
  Administered 2016-11-22 – 2016-11-23 (×4): 200 mL

## 2016-11-22 MED ORDER — VITAL HIGH PROTEIN PO LIQD
1000.0000 mL | ORAL | Status: DC
  Administered 2016-11-22: 1000 mL

## 2016-11-22 NOTE — Anesthesia Postprocedure Evaluation (Signed)
Anesthesia Post Note  Patient: ROAN MIKLOS  Procedure(s) Performed: Procedure(s) (LRB): DRESSING CHANGE UNDER ANESTHESIA (N/A)  Patient location during evaluation: PACU Anesthesia Type: General Level of consciousness: sedated Pain management: pain level controlled Vital Signs Assessment: post-procedure vital signs reviewed and stable Respiratory status: patient on ventilator - see flowsheet for VS Cardiovascular status: stable Postop Assessment: no signs of nausea or vomiting Anesthetic complications: no     Last Vitals:  Vitals:   11/22/16 0500 11/22/16 0600  BP: 122/75 108/65  Pulse: (!) 107 (!) 102  Resp: (!) 24 (!) 24  Temp:      Last Pain:  Vitals:   11/22/16 0400  TempSrc: Oral  PainSc:                  Silvana Newness A

## 2016-11-22 NOTE — Progress Notes (Signed)
Bite block placed due to patient biting ET tube even after sedation increased and PRN versed and fentanyl given.

## 2016-11-22 NOTE — Progress Notes (Signed)
Pharmacy Consult for Electrolyte Monitoring Indication: Hypokalemia/hypomagnesemia  Allergies  Allergen Reactions  . Isosorbide     Headache, nausea, vomiting    Patient Measurements: Height: 6' (182.9 cm) Weight: (!) 347 lb (157.4 kg) IBW/kg (Calculated) : 77.6  Vital Signs: Temp: 98.5 F (36.9 C) (05/21 0800) Temp Source: Oral (05/21 0800) BP: 166/110 (05/21 1100) Pulse Rate: 108 (05/21 1100) Intake/Output from previous day: 05/20 0701 - 05/21 0700 In: 2893.7 [I.V.:1633; NG/GT:670.7; IV Piggyback:500] Out: 3567 [Urine:1010; Drains:2820; Blood:2] Intake/Output from this shift: Total I/O In: 553.4 [I.V.:148.4; NG/GT:405] Out: 600 [Urine:250; Drains:350]  Labs:  Recent Labs  11/20/16 0506 11/21/16 0503 11/22/16 0435  CREATININE 0.77 0.73 0.97  MG 2.2 2.0 1.9  PHOS  --  3.5  --    Estimated Creatinine Clearance: 128.6 mL/min (by C-G formula based on SCr of 0.97 mg/dL).  Potassium (mmol/L)  Date Value  11/22/2016 3.2 (L)   Phosphorus (mg/dL)  Date Value  11/21/2016 3.5   Calcium (mg/dL)  Date Value  11/22/2016 7.2 (L)   Assessment: 56 y/o M with ex lap appendectomy 4/28 admitted to ICU due to evisceration. Patient is currently ventilated and sedated. Patient receiving Vital High Protein at  65 mL/hr.    Plan:  Potassium chloride 40 meq bid VT x 4 doses per CCM. CMP ordered for AM. Will add phos and mg. TF started 5/15 so daily monitoring of electrolytes should continue through tomorrow (1 week) due to high risk of refeeding syndrome.   Ulice Dash D 11/22/2016 12:04 PM

## 2016-11-22 NOTE — Progress Notes (Signed)
PULMONARY / CRITICAL CARE MEDICINE   Name: Peter [name] MRN: [id number] DOB: [birth year]    ADMISSION DATE:  11/11/2016  PT PROFILE:   56 y.o. male underwent open appendectomy 04/28 and discharged 11/07/16. Readmitted 05/10 with wound dehiscence and necrotizing fasciitis. Wound debrided and wound vac placed. Admitted to ICU intubated with severe sepsis with shock. Has returned to OR multiple times for debridements and washouts  MAJOR EVENTS/TEST RESULTS: 05/10 Admitted to ICU intubated with severe sepsis with shock 05/10 CTAP: Open ventral lower abdominal wall wound and broad-based left lower quadrant abdominal wall hernia at the deep margin of the open wound measuring approximately 13 x 15 cm. Through the herniation extending into the open wound are loops of small and large bowel which demonstrate wall thickening and surrounding inflammatory changes. There is proximal mild small bowel obstruction. Fat stranding in the right lower quadrant appendectomy bed may represent phlegmonous or postsurgical changes. No peritoneal abscess identified. 05/11 Return to OR for wound vac change 05/13 Return to OR for wound vac change 05/15 Return to OR for wound vac change 05/17 Return to OR for wound vac change 05/20 Return to OR for wound vac change   INDWELLING DEVICES:: ETT 05/10 >>  L IJ CVL 05/10 >> 05/19 RUE PICC 05/19 >>   MICRO DATA: MRSA PCR 05/10 >> NEG Resp 05/13 >> few candida Blood 05/10 >> NEG Wound 05/10 >> Klebs, actinomyces, bacteroides, enterobacter  ANTIMICROBIALS:  Pip-tazo 05/10 >>     SUBJECTIVE:  RASS -4, agitated and biting on ETT on WUA, not F/C  VITAL SIGNS: BP (!) 166/110   Pulse (!) 108   Temp 98.5 F (36.9 C) (Oral)   Resp (!) 29   Ht 6' (1.829 m)   Wt (!) 347 lb (157.4 kg)   SpO2 96%   BMI 47.06 kg/m   HEMODYNAMICS:    VENTILATOR SETTINGS: Vent Mode: PRVC FiO2 (%):  [30 %] 30 % Set Rate:  [24 bmp] 24 bmp Vt Set:  [500 mL] 500 mL PEEP:  [5  cmH20] 5 cmH20 Plateau Pressure:  [16 cmH20-18 cmH20] 18 cmH20  INTAKE / OUTPUT: I/O last 3 completed shifts: In: 4672.2 [I.V.:2590.2; Other:790; NG/GT:792; IV Piggyback:500] Out: 7893 [Urine:1730; Drains:3320; Blood:2]  PHYSICAL EXAMINATION: General: intubated, sedated, not F/C Neuro: PERRL, EOMI, MAEs HEENT: NCAT, sclerae white Cardiovascular: reg, no M Lungs: clear anteriorly Abdomen: minimally distended, soft, diminished BS, wound vac in place Ext: symmetric ankle edema Skin: no lesions noted  LABS:  BMET  Recent Labs Lab 11/20/16 0506 11/21/16 0503 11/22/16 0435  NA 135 144 140  K 3.3* 3.6 3.2*  CL 104 111 109  CO2 27 29 28   BUN 24* 28* 32*  CREATININE 0.77 0.73 0.97  GLUCOSE 116* 93 109*    Electrolytes  Recent Labs Lab 11/18/16 1814 11/19/16 0429 11/20/16 0506 11/21/16 0503 11/22/16 0435  CALCIUM 6.8* 7.4* 7.2* 7.7* 7.2*  MG 1.9 1.9 2.2 2.0 1.9  PHOS 4.1 4.1  --  3.5  --     CBC  Recent Labs Lab 11/17/16 0510 11/18/16 0351 11/19/16 0429  WBC 15.0* 10.7* 9.3  HGB 9.0* 8.5* 9.2*  HCT 26.1* 25.0* 27.0*  PLT 317 353 316    Coag's No results for input(s): APTT, INR in the last 168 hours.  Sepsis Markers No results for input(s): LATICACIDVEN, PROCALCITON, O2SATVEN in the last 168 hours.  ABG No results for input(s): PHART, PCO2ART, PO2ART in the last 168 hours.  Liver Enzymes  Recent  Labs Lab 11/16/16 1852 11/19/16 0429  AST 46*  --   ALT 24  --   ALKPHOS 63  --   BILITOT 1.3*  --   ALBUMIN 1.4* 1.2*    Cardiac Enzymes No results for input(s): TROPONINI, PROBNP in the last 168 hours.  Glucose  Recent Labs Lab 11/21/16 1547 11/21/16 1956 11/21/16 2348 11/22/16 0422 11/22/16 0717 11/22/16 1113  GLUCAP 92 104* 116* 110* 108* 112*    CXR: NNF   ASSESSMENT / PLAN:  PULMONARY A: Prolonged VDRF due to repeated trips to OR P:   Cont vent support - settings reviewed and/or adjusted Cont vent bundle Daily SBT  if/when meets criteria Trach tube planned 05/22  CARDIOVASCULAR A:  Septic shock, resolved P:  MAP goal 65 mmHg Vasopressors as needed  RENAL A:   AKI, resolved Recurrent hypokalemia P:   Monitor BMET intermittently Monitor I/Os Correct electrolytes as indicated  GASTROINTESTINAL A:   Post laparotomy (recurrent) J tube present P:   SUP: enteral famotidine Cont TF protocol  HEMATOLOGIC A:   Anemia of acute illness without acute blood loss P:  DVT px: enoxaparin Monitor CBC intermittently Transfuse per usual guidelines  INFECTIOUS A:   Severe sepsis Necrotizing fasciitis, polymicrobial P:   Monitor temp, WBC count Micro and abx as above  ENDOCRINE A:   No acute issues P:   Monitor CBGs Consider SSI for glu > 180  NEUROLOGIC A:   ICU/vent associated discomfort Post op pain P:   RASS goal: -1, -2 Cont PAD protocol with propofol and fentanyl Once trach tube placed, will try to transition to intermittent meds   Discussed with Dr Tami Ribas, Dahlia Byes. No family available at the time of this writing  CCM time: 40 mins The above time includes time spent in consultation with patient and/or family members and reviewing care plan on multidisciplinary rounds  Merton Border, MD PCCM service Mobile [phone] Pager [phone]  11/22/2016, 11:42 AM

## 2016-11-22 NOTE — Progress Notes (Signed)
Nutrition Follow-up  DOCUMENTATION CODES:   Morbid obesity  INTERVENTION:  1. Continue TF regimen, now VHP @ 62mL/hr, PS 88mL BID with propfol @ 12.69mL/hr provides 2095 calories (106% estimated needs), 167gm protein and 1304cc free water   NUTRITION DIAGNOSIS:   Inadequate oral intake related to inability to eat as evidenced by NPO status. -ongoing  GOAL:   Provide needs based on ASPEN/SCCM guidelines -meeting  MONITOR:   I & O's, Labs, Skin, Vent status  REASON FOR ASSESSMENT:   Ventilator    ASSESSMENT:   Peter [name] is a 56 y.o. male who had a laparotomy and appendectomy 4/28 returns now with eviscerated bowel s/p ex lap, abd wash out, abd wall debridement  Patient is currently intubated on ventilator support MV: 12 L/min Temp (24hrs), Avg:99 F (37.2 C), Min:98.5 F (36.9 C), Max:100.1 F (37.8 C) Propofol: 12.7 ml/hr --> 335 calories  Intake/Output Summary (Last 24 hours) at 11/22/16 1749 Last data filed at 11/22/16 0800  Gross per 24 hour  Intake          2757.27 ml  Output             3832 ml  Net         -1074.73 ml  Very edematous today, Weight up 17 kgs over 4 days Discussed in rounds Labs and medications reviewed: K 3.2,  Fentanyl gtt, LR @ 20mL/hr  Diet Order:     Skin:  Reviewed, no issues  Last BM:  11/04/2016  Height:   Ht Readings from Last 1 Encounters:  11/11/16 6' (1.829 m)    Weight:   Wt Readings from Last 1 Encounters:  11/22/16 (!) 347 lb (157.4 kg)    Ideal Body Weight:  67.27 kg  BMI:  Body mass index is 47.06 kg/m.  Estimated Nutritional Needs:   Kcal:  4496-7591 calories  Protein:  >/= 168 gm  Fluid:  Per MD/NP/PA  EDUCATION NEEDS:   Education needs no appropriate at this time  Peter Anis. Patrece Tallie, MS, RD LDN Inpatient Clinical Dietitian Pager [phone]

## 2016-11-22 NOTE — Progress Notes (Signed)
Patient biting ET tube, not following commands and desated down to 79%.  Fentanyl bolus given and propofol titrated up.  o2 sats improved briefly and then patient desated down to 34% requiring bagging.  Versed given PRN and with bagging sats improved to 98% and placed patient back on ventilator.  RN notified [name], RRT during this time to come assess patient.  RN continuing to monitor closely.

## 2016-11-22 NOTE — Progress Notes (Signed)
[facility] INFECTIOUS DISEASE PROGRESS NOTE Date of Admission:  11/11/2016     ID: Peter [name] is a 56 y.o. male with abd infection and necrotizing wound   Active Problems:   Evisceration of bowel   Necrotizing soft tissue infection   Open abdominal incision with drainage  Subjective: No fevers, not on pressors, sedated  ROS  Eleven systems are reviewed and negative except per hpi  Medications:  Antibiotics Given (last 72 hours)    Date/Time Action Medication Dose Rate   11/19/16 1704 New Bag/Given   piperacillin-tazobactam (ZOSYN) IVPB 4.5 g 4.5 g 25 mL/hr   11/19/16 2152 New Bag/Given   piperacillin-tazobactam (ZOSYN) IVPB 4.5 g 4.5 g 25 mL/hr   11/20/16 0511 New Bag/Given   piperacillin-tazobactam (ZOSYN) IVPB 4.5 g 4.5 g 25 mL/hr   11/20/16 1356 New Bag/Given   piperacillin-tazobactam (ZOSYN) IVPB 4.5 g 4.5 g 25 mL/hr   11/20/16 2132 New Bag/Given   piperacillin-tazobactam (ZOSYN) 4.5 g in dextrose 5 % 100 mL IVPB 4.5 g 200 mL/hr   11/21/16 0535 New Bag/Given   piperacillin-tazobactam (ZOSYN) 4.5 g in dextrose 5 % 100 mL IVPB 4.5 g 200 mL/hr   11/21/16 1435 New Bag/Given   piperacillin-tazobactam (ZOSYN) 4.5 g in dextrose 5 % 100 mL IVPB 4.5 g 200 mL/hr   11/21/16 2114 New Bag/Given   piperacillin-tazobactam (ZOSYN) 4.5 g in dextrose 5 % 100 mL IVPB 4.5 g 200 mL/hr   11/22/16 0600 New Bag/Given   piperacillin-tazobactam (ZOSYN) 4.5 g in dextrose 5 % 100 mL IVPB 4.5 g 200 mL/hr     . carbamazepine  200 mg Per J Tube [id number]   And  . carbamazepine  400 mg Per J Tube QHS  . chlorhexidine gluconate (MEDLINE KIT)  15 mL Mouth Rinse BID  . enoxaparin (LOVENOX) injection  40 mg Subcutaneous BID  . famotidine  20 mg Per Tube BID  . feeding supplement (PRO-STAT SUGAR FREE 64)  30 mL Per Tube BID  . free water  200 mL Per Tube Q8H  . insulin aspart  0-15 Units Subcutaneous Q4H  . ipratropium-albuterol  3 mL Nebulization Q6H  . lamoTRIgine  200 mg Per J Tube BID   . latanoprost  1 drop Both Eyes QHS  . mouth rinse  15 mL Mouth Rinse 10 times per day  . potassium chloride  40 mEq Per Tube BID  . sertraline  100 mg Per J Tube Daily  . sodium chloride flush  10-40 mL Intracatheter Q12H    Objective: Vital signs in last 24 hours: Temp:  [98.5 F (36.9 C)-100.1 F (37.8 C)] 98.5 F (36.9 C) (05/21 1219) Pulse Rate:  [98-121] 117 (05/21 1400) Resp:  [21-29] 25 (05/21 1400) BP: (99-166)/(62-110) 108/83 (05/21 1400) SpO2:  [96 %-100 %] 99 % (05/21 1400) FiO2 (%):  [30 %] 30 % (05/21 1141) Weight:  [157.4 kg (347 lb)] 157.4 kg (347 lb) (05/21 0500) Constitutional: morbidly obese, intubated, sedate.  HENT: anicteric Mouth/Throat: ETT in place Cardiovascular: Normal rate, regular rhythm and normal heart sounds.  Pulmonary/Chest: mech BS Abdominal: obese, large wound vac over abd wound, J tube in place with bilious drainage, no obvious erythema Lymphadenopathy: He has no cervical adenopathy.  Neurological: sedated  Skin: Skin is warm and dry. No rash noted. No erythema.  Psychiatric: unable to obtain   Lab Results  Recent Labs  11/21/16 0503 11/22/16 0435  NA 144 140  K 3.6 3.2*  CL 111 109  CO2  29 28  BUN 28* 32*  CREATININE 0.73 0.97    Microbiology: Results for orders placed or performed during the hospital encounter of 11/11/16  Aerobic/Anaerobic Culture (surgical/deep wound)     Status: None   Collection Time: 11/11/16  7:43 AM  Result Value Ref Range Status   Specimen Description TISSUE ABDOMEN  Final   Special Requests NONE  Final   Gram Stain   Final    FEW WBC PRESENT,BOTH PMN AND MONONUCLEAR NO ORGANISMS SEEN    Culture   Final    FEW KLEBSIELLA OXYTOCA RARE ENTEROBACTER CLOACAE RARE ACTINOMYCES SPECIES Standardized susceptibility testing for this organism is not available. MODERATE BACTEROIDES FRAGILIS BETA LACTAMASE NEGATIVE Performed at [facility], [place] [address], [place], [place] [address]    Report  Status 11/20/2016 FINAL  Final   Organism ID, Bacteria KLEBSIELLA OXYTOCA  Final   Organism ID, Bacteria ENTEROBACTER CLOACAE  Final      Susceptibility   Enterobacter cloacae - MIC*    CEFAZOLIN >=64 RESISTANT Resistant     CEFEPIME <=1 SENSITIVE Sensitive     CEFTAZIDIME <=1 SENSITIVE Sensitive     CEFTRIAXONE <=1 SENSITIVE Sensitive     CIPROFLOXACIN <=0.25 SENSITIVE Sensitive     GENTAMICIN <=1 SENSITIVE Sensitive     IMIPENEM <=0.25 SENSITIVE Sensitive     TRIMETH/SULFA <=20 SENSITIVE Sensitive     PIP/TAZO <=4 SENSITIVE Sensitive     * RARE ENTEROBACTER CLOACAE   Klebsiella oxytoca - MIC*    AMPICILLIN >=32 RESISTANT Resistant     CEFAZOLIN 8 SENSITIVE Sensitive     CEFEPIME <=1 SENSITIVE Sensitive     CEFTAZIDIME <=1 SENSITIVE Sensitive     CEFTRIAXONE <=1 SENSITIVE Sensitive     CIPROFLOXACIN <=0.25 SENSITIVE Sensitive     GENTAMICIN <=1 SENSITIVE Sensitive     IMIPENEM <=0.25 SENSITIVE Sensitive     TRIMETH/SULFA <=20 SENSITIVE Sensitive     AMPICILLIN/SULBACTAM 8 SENSITIVE Sensitive     PIP/TAZO <=4 SENSITIVE Sensitive     Extended ESBL NEGATIVE Sensitive     * FEW KLEBSIELLA OXYTOCA  MRSA PCR Screening     Status: None   Collection Time: 11/11/16 10:00 AM  Result Value Ref Range Status   MRSA by PCR NEGATIVE NEGATIVE Final    Comment:        The GeneXpert MRSA Assay (FDA approved for NASAL specimens only), is one component of a comprehensive MRSA colonization surveillance program. It is not intended to diagnose MRSA infection nor to guide or monitor treatment for MRSA infections.   CULTURE, BLOOD (ROUTINE X 2) w Reflex to ID Panel     Status: None   Collection Time: 11/11/16  3:29 PM  Result Value Ref Range Status   Specimen Description BLOOD R AC  Final   Special Requests BOTTLES DRAWN AEROBIC AND ANAEROBIC BCAV  Final   Culture NO GROWTH 5 DAYS  Final   Report Status 11/16/2016 FINAL  Final  CULTURE, BLOOD (ROUTINE X 2) w Reflex to ID Panel     Status:  None   Collection Time: 11/11/16  3:29 PM  Result Value Ref Range Status   Specimen Description BLOOD R HAND  Final   Special Requests BOTTLES DRAWN AEROBIC AND ANAEROBIC BCAV  Final   Culture NO GROWTH 5 DAYS  Final   Report Status 11/16/2016 FINAL  Final  Culture, respiratory (NON-Expectorated)     Status: None   Collection Time: 11/14/16  3:00 PM  Result Value  Ref Range Status   Specimen Description TRACHEAL ASPIRATE  Final   Special Requests NONE  Final   Gram Stain   Final    RARE WBC PRESENT, PREDOMINANTLY PMN RARE YEAST Performed at [facility], [place] [address], [place], [place] [address]    Culture FEW CANDIDA ALBICANS  Final   Report Status 11/16/2016 FINAL  Final    Studies/Results: Dg Abd 1 View  Result Date: 11/22/2016 CLINICAL DATA:  Abdominal pain. EXAM: ABDOMEN - 1 VIEW COMPARISON:  11/19/2016 FINDINGS: Exam limited by patient's body habitus. The visualized lung bases are grossly clear. The bowel gas pattern is unremarkable. No dilated loops of bowel to suggest small bowel obstruction or ileus. No free air. The soft tissue shadows are grossly maintained. The bony structures are grossly normal. IMPRESSION: Limited examination but no definite findings for acute abdominal process. Electronically Signed   By: Marijo Sanes M.D.   On: 11/22/2016 11:44    Assessment/Plan: MAXIMUS HOFFERT is a 56 y.o. male who underwent open lap 4/28 for appendicitis readmitted 5/10 with sepsis and evisceration.  On admission wbc up to 38. He underwent surgery immediately with finding of some necrotic full tissue infection but no bowel perforation. He has had multiple surgeries but most recently has no evidence of continued or progressive necrosis. Cultures mixed with Klebsiella, enterobacter, actinomyces, bacteroides.  He has a wound vac in place and a J tube. Will need long term IV abx. Discussed need for long term abx.  Recommendations Continue IV Zosyn Continue wound care.   Monitor for development of abd abscesses. Thank you very much for the consult. Will follow with you.  [place],  P   11/22/2016, 2:59 PM

## 2016-11-22 NOTE — Progress Notes (Signed)
11/22/2016  Subjective: Patient is status post multiple abdominal wound VAC changes are last 2 weeks due to fascial dehiscence and necrosis from a necrotizing soft tissue infection resulting in evisceration. No acute events overnight. Patient has been off pressors. Currently still sedation due to discomfort with endotracheal tube. Has been getting tube feeds. Of note he has not had any bowel movements recorded during this admission.  Vital signs: Temp:  [98.4 F (36.9 C)-100.1 F (37.8 C)] 98.4 F (36.9 C) (05/21 1600) Pulse Rate:  [98-121] 100 (05/21 1700) Resp:  [14-29] 15 (05/21 1700) BP: (100-166)/(62-110) 102/63 (05/21 1700) SpO2:  [95 %-100 %] 95 % (05/21 1700) FiO2 (%):  [30 %] 30 % (05/21 1628) Weight:  [157.4 kg (347 lb)] 157.4 kg (347 lb) (05/21 0500)   Intake/Output: 05/20 0701 - 05/21 0700 In: 2893.7 [I.V.:1633; NG/GT:670.7; IV Piggyback:500] Out: 9983 [Urine:1010; Drains:2820; Blood:2] Last BM Date:  (UTA-PTA)  Physical Exam: Constitutional: No acute distress, sedated, intubated Cardiac:  Sinus tachycardia, no murmurs Pulm: Lungs are clear bilaterally with no wheezes. Endotracheal tube in place. Abdomen: Soft, obese, with wound VAC in place in the abdominal incision. No evidence of leak. GJ tube in place and secured. G component is to suction currently with a total of 1100 ML recorder for output. GU:  Foley catheter in place  Labs:  No results for input(s): WBC, HGB, HCT, PLT in the last 72 hours.  Recent Labs  11/21/16 0503 11/22/16 0435  NA 144 140  K 3.6 3.2*  CL 111 109  CO2 29 28  GLUCOSE 93 109*  BUN 28* 32*  CREATININE 0.73 0.97  CALCIUM 7.7* 7.2*   No results for input(s): LABPROT, INR in the last 72 hours.  Imaging: Dg Abd 1 View  Result Date: 11/22/2016 CLINICAL DATA:  Abdominal pain. EXAM: ABDOMEN - 1 VIEW COMPARISON:  11/19/2016 FINDINGS: Exam limited by patient's body habitus. The visualized lung bases are grossly clear. The bowel gas  pattern is unremarkable. No dilated loops of bowel to suggest small bowel obstruction or ileus. No free air. The soft tissue shadows are grossly maintained. The bony structures are grossly normal. IMPRESSION: Limited examination but no definite findings for acute abdominal process. Electronically Signed   By: Marijo Sanes M.D.   On: 11/22/2016 11:44    Assessment/Plan: 56 year old male status post multiple abdominal wound VAC changes for abdominal wound evisceration and necrosis of the fascia with a necrotizing soft tissue infection of the midline incision.  -Neuro: Continue sedation and titrate as needed for comfort. -Pulmonary: Patient scheduled for tracheostomy tomorrow. Hopefully with this he'll be able to come off sedation more as he should be more comfortable. -Cardiac: No issues at this moment no pressors needed. -Abdomen: We'll take the patient to the OR tomorrow as well for another wound VAC change. We'll inspect the patient's fascia in midline incision to see how healthy they are and whether the patient may be ready for closure with mesh. At this point my concern is that he is out of the window for closure given that he's 2 weeks out of his initial evisceration. He may require instead temporary biodegradable mesh to close his abdomen with subsequent referral to a tertiary care center or hernia center for eventual closure. KUB today does not show any distended bowel loops. Will order suppository for bowel movements. -GU: Continue Foley in place for strict ins and outs. -ID: Continue IV Zosyn per Dr. Blane Ohara recommendations. Appreciate his input. X -Proph: Hold subcutaneous Lovenox for  tracheostomy tomorrow. Otherwise will resume DVT prophylaxis after surgery.   Melvyn Neth, [place]

## 2016-11-23 ENCOUNTER — Encounter: Payer: Self-pay | Admitting: Anesthesiology

## 2016-11-23 ENCOUNTER — Inpatient Hospital Stay: Payer: 59 | Admitting: Certified Registered"

## 2016-11-23 ENCOUNTER — Encounter: Payer: Self-pay | Admitting: Surgery

## 2016-11-23 ENCOUNTER — Encounter
Admission: EM | Disposition: A | Discharge status: Other Acute Inpatient Hospital | Payer: Self-pay | Source: Home / Self Care | Attending: Surgery

## 2016-11-23 ENCOUNTER — Inpatient Hospital Stay: Payer: 59

## 2016-11-23 DIAGNOSIS — Z9911 Dependence on respirator [ventilator] status: Secondary | ICD-10-CM

## 2016-11-23 HISTORY — PX: APPLICATION OF WOUND VAC: SHX5189

## 2016-11-23 LAB — COMPREHENSIVE METABOLIC PANEL
ALBUMIN: UNDETERMINED g/dL (ref 3.5–5.0)
ALT: UNDETERMINED U/L (ref 17–63)
AST: 52 U/L — AB (ref 15–41)
Alkaline Phosphatase: 145 U/L — ABNORMAL HIGH (ref 38–126)
BUN: 38 mg/dL — AB (ref 6–20)
CHLORIDE: 102 mmol/L (ref 101–111)
CO2: UNDETERMINED mmol/L (ref 22–32)
CREATININE: 0.87 mg/dL (ref 0.61–1.24)
Calcium: UNDETERMINED mg/dL (ref 8.9–10.3)
GFR calc Af Amer: >60 mL/min (ref >60)
GFR calc non Af Amer: >60 mL/min (ref >60)
Glucose, Bld: 114 mg/dL — ABNORMAL HIGH (ref 65–99)
SODIUM: UNDETERMINED mmol/L (ref 135–145)
Total Bilirubin: 1.8 mg/dL — ABNORMAL HIGH (ref 0.3–1.2)
Total Protein: UNDETERMINED g/dL (ref 6.5–8.1)

## 2016-11-23 LAB — CBC
HCT: 27.9 % — ABNORMAL LOW (ref 40.0–52.0)
Hemoglobin: 9.9 g/dL — ABNORMAL LOW (ref 13.0–18.0)
MCH: 31.1 pg (ref 26.0–34.0)
MCHC: 35.6 g/dL (ref 32.0–36.0)
MCV: 87.4 fL (ref 80.0–100.0)
PLATELETS: 214 10*3/uL (ref 150–440)
RBC: 3.19 MIL/uL — AB (ref 4.40–5.90)
RDW: 14.2 % (ref 11.5–14.5)
WBC: 8.8 10*3/uL (ref 3.8–10.6)

## 2016-11-23 LAB — GLUCOSE, CAPILLARY
GLUCOSE-CAPILLARY: 104 mg/dL — AB (ref 65–99)
GLUCOSE-CAPILLARY: 105 mg/dL — AB (ref 65–99)
GLUCOSE-CAPILLARY: 105 mg/dL — AB (ref 65–99)
GLUCOSE-CAPILLARY: 94 mg/dL (ref 65–99)
GLUCOSE-CAPILLARY: 97 mg/dL (ref 65–99)

## 2016-11-23 LAB — PHOSPHORUS: Phosphorus: 3.9 mg/dL (ref 2.5–4.6)

## 2016-11-23 LAB — MAGNESIUM: Magnesium: UNDETERMINED mg/dL (ref 1.7–2.4)

## 2016-11-23 SURGERY — APPLICATION, WOUND VAC
Anesthesia: General | Wound class: Clean Contaminated

## 2016-11-23 MED ORDER — ORAL CARE MOUTH RINSE: 15.0000 mL | OROMUCOSAL | 0 refills | Status: DC | PRN

## 2016-11-23 MED ORDER — FENTANYL BOLUS VIA INFUSION: 50.0000 ug | INTRAVENOUS | 0 refills | Status: DC | PRN

## 2016-11-23 MED ORDER — MIDAZOLAM HCL 2 MG/2ML IJ SOLN
INTRAMUSCULAR | Status: DC | PRN
  Administered 2016-11-23: 2 mg via INTRAVENOUS

## 2016-11-23 MED ORDER — MIDAZOLAM HCL 2 MG/2ML IJ SOLN
INTRAMUSCULAR | Status: AC
  Filled 2016-11-23: qty 2

## 2016-11-23 MED ORDER — FAMOTIDINE 20 MG PO TABS: 20.0000 mg | ORAL_TABLET | Freq: Two times a day (BID) | ORAL | Status: DC

## 2016-11-23 MED ORDER — FENTANYL 2500MCG IN NS 250ML (10MCG/ML) PREMIX INFUSION: 25.0000 ug/h | INTRAVENOUS | Status: DC

## 2016-11-23 MED ORDER — ONDANSETRON HCL 4 MG/2ML IJ SOLN: 4.0000 mg | Freq: Four times a day (QID) | INTRAMUSCULAR | 0 refills | Status: DC | PRN

## 2016-11-23 MED ORDER — ONDANSETRON HCL 4 MG/2ML IJ SOLN
INTRAMUSCULAR | Status: DC | PRN
  Administered 2016-11-23: 4 mg via INTRAVENOUS

## 2016-11-23 MED ORDER — PIPERACILLIN SOD-TAZOBACTAM SO 2.25 (2-0.25) G IV SOLR: 4.5000 g | Freq: Three times a day (TID) | INTRAVENOUS | Status: DC

## 2016-11-23 MED ORDER — CHLORHEXIDINE GLUCONATE 0.12% ORAL RINSE (MEDLINE KIT): 15.0000 mL | Freq: Two times a day (BID) | OROMUCOSAL | 0 refills | Status: DC

## 2016-11-23 MED ORDER — VITAL HIGH PROTEIN PO LIQD: 1000.0000 mL | ORAL | Status: DC

## 2016-11-23 MED ORDER — LACTATED RINGERS IV SOLN: 20.0000 mL | INTRAVENOUS | 0 refills | Status: DC

## 2016-11-23 MED ORDER — FENTANYL CITRATE (PF) 100 MCG/2ML IJ SOLN
INTRAMUSCULAR | Status: AC
  Filled 2016-11-23: qty 2

## 2016-11-23 MED ORDER — SODIUM CHLORIDE 0.9% FLUSH: 10.0000 mL | INTRAVENOUS | Status: DC | PRN

## 2016-11-23 MED ORDER — IPRATROPIUM-ALBUTEROL 0.5-2.5 (3) MG/3ML IN SOLN: 3.0000 mL | Freq: Four times a day (QID) | RESPIRATORY_TRACT | Status: DC

## 2016-11-23 MED ORDER — MIDAZOLAM HCL 2 MG/2ML IJ SOLN: 2.0000 mg | INTRAMUSCULAR | 0 refills | Status: DC | PRN

## 2016-11-23 MED ORDER — BLISTEX MEDICATED EX OINT: 1.0000 "application " | TOPICAL_OINTMENT | CUTANEOUS | Status: DC | PRN

## 2016-11-23 MED ORDER — INSULIN ASPART 100 UNIT/ML ~~LOC~~ SOLN: 0.0000 [IU] | SUBCUTANEOUS | 11 refills | Status: DC

## 2016-11-23 MED ORDER — SODIUM CHLORIDE 0.9% FLUSH: 10.0000 mL | Freq: Two times a day (BID) | INTRAVENOUS | Status: DC

## 2016-11-23 MED ORDER — FENTANYL CITRATE (PF) 100 MCG/2ML IJ SOLN
INTRAMUSCULAR | Status: DC | PRN
  Administered 2016-11-23 (×2): 50 ug via INTRAVENOUS

## 2016-11-23 MED ORDER — POTASSIUM CHLORIDE 20 MEQ/15ML (10%) PO SOLN: 40.0000 meq | Freq: Two times a day (BID) | ORAL | 0 refills | Status: DC

## 2016-11-23 MED ORDER — ONDANSETRON HCL 4 MG/2ML IJ SOLN
INTRAMUSCULAR | Status: AC
  Filled 2016-11-23: qty 2

## 2016-11-23 MED ORDER — LATANOPROST 0.005 % OP SOLN: 1.0000 [drp] | Freq: Every day | OPHTHALMIC | 12 refills | Status: DC

## 2016-11-23 MED ORDER — FREE WATER: 200.0000 mL | Freq: Three times a day (TID) | Status: DC

## 2016-11-23 MED ORDER — LACTATED RINGERS IV SOLN
INTRAVENOUS | Status: DC | PRN
  Administered 2016-11-23: 13:00:00 via INTRAVENOUS

## 2016-11-23 SURGICAL SUPPLY — 6 items
COVER LIGHT HANDLE STERIS (MISCELLANEOUS) ×4 IMPLANT
GLOVE BIO SURGEON STRL SZ7.5 (GLOVE) ×6 IMPLANT
GOWN STRL REUS W/ TWL LRG LVL3 (GOWN DISPOSABLE) ×2 IMPLANT
GOWN STRL REUS W/TWL LRG LVL3 (GOWN DISPOSABLE) ×2
SPONGE LAP 18X18 5 PK (GAUZE/BANDAGES/DRESSINGS) ×2 IMPLANT
TOWEL OR 17X26 4PK STRL BLUE (TOWEL DISPOSABLE) ×4 IMPLANT

## 2016-11-23 NOTE — Anesthesia Preprocedure Evaluation (Signed)
Anesthesia Evaluation  Patient identified by MRN, date of birth, ID band Patient awake    Reviewed: Allergy & Precautions, H&P , NPO status , Patient's Chart, lab work & pertinent test results, reviewed documented beta blocker date and time   History of Anesthesia Complications (+) DIFFICULT AIRWAY  Airway Mallampati: Intubated  TM Distance: >3 FB Neck ROM: full    Dental  (+) Teeth Intact   Pulmonary shortness of breath, sleep apnea ,    Pulmonary exam normal breath sounds clear to auscultation- rhonchi (-) wheezing      Cardiovascular hypertension, Pt. on medications (-) CAD, (-) Past MI and (-) Cardiac Stents Normal cardiovascular exam Rhythm:regular Rate:Normal - Systolic murmurs and - Diastolic murmurs    Neuro/Psych PSYCHIATRIC DISORDERS Depression negative neurological ROS     GI/Hepatic negative GI ROS, Neg liver ROS,   Endo/Other  negative endocrine ROSneg diabetes  Renal/GU negative Renal ROS  negative genitourinary   Musculoskeletal   Abdominal (+) + obese,   Peds negative pediatric ROS (+)  Hematology negative hematology ROS (+)   Anesthesia Other Findings Past Medical History: No date: Blood in stool No date: Difficult intubation No date: Glaucoma     Comment: since 2004 No date: Hypertension     Comment: since 2000 No date: Obesity, unspecified 2011: Ulcer     Comment: gastric No date: Unspecified hemorrhoids without mention of com* Past Surgical History: 6/78/9381: APPLICATION OF WOUND VAC N/A     Comment: Procedure: APPLICATION OF WOUND VAC-ABDOMINAL;              Surgeon: Florene Glen, MD;  Location: [facility]              ORS;  Service: General;  Laterality: N/A; 06/23/2016: CARDIAC CATHETERIZATION Left     Comment: Procedure: Left Heart Cath and Coronary               Angiography;  Surgeon: Nelva Bush, MD;                Location: [facility];  Service:   Cardiovascular;  Laterality: Left; 1992: CHOLECYSTECTOMY 05/22/2010: COLONOSCOPY W/ POLYPECTOMY     Comment: 17mmtransverse colon polyp, traditional               serrated adenoma,negative for high grade               dysplasia & malignancy. rectal               polyp-3mm,negative for dysplasia & malignancy. 2010: KNEE SURGERY Left 10/30/2016: LAPAROSCOPIC APPENDECTOMY N/A     Comment: Procedure: APPENDECTOMY LAPAROSCOPIC changed                to open application of wound vac;  Surgeon:               [name], MD;  Location: [facility] ORS;                Service: General;  Laterality: N/A; 11/11/2016: LAPAROTOMY N/A     Comment: Procedure: EXPLORATORY LAPAROTOMY, DEBRIDEMENT              OF ABDOMINAL WOUND, ABDOMINAL [place];                Surgeon: [name], MD;  Location: [facility]               ORS;  Service: General;  Laterality: N/A; 11/12/2016: LAPAROTOMY N/A     Comment: Procedure:  EXPLORATORY LAPAROTOMY;  Surgeon:               Clayburn Pert, MD;  Location: [facility] ORS;                Service: General;  Laterality: N/A; 11/14/2016: LAPAROTOMY N/A     Comment: Procedure: EXPLORATORY LAPAROTOMY; Irrigation,              partial closure;  Surgeon: Clayburn Pert,               MD;  Location: [facility] ORS;  Service: General;                Laterality: N/A; BMI    Body Mass Index:  41.91 kg/m     Reproductive/Obstetrics negative OB ROS                             Anesthesia Physical  Anesthesia Plan  ASA: IV  Anesthesia Plan: General ETT   Post-op Pain Management:    Induction:   Airway Management Planned: Oral ETT  Additional Equipment:   Intra-op Plan:   Post-operative Plan: Post-operative intubation/ventilation  Informed Consent: I have reviewed the patients History and Physical, chart, labs and discussed the procedure including the risks, benefits and alternatives for the proposed anesthesia with the patient or authorized representative  who has indicated his/her understanding and acceptance.   Dental Advisory Given and Consent reviewed with POA  Plan Discussed with: CRNA and Anesthesiologist  Anesthesia Plan Comments:         Anesthesia Quick Evaluation

## 2016-11-23 NOTE — Care Management (Addendum)
RNCM received call from Dr. Hampton Abbot regarding patient transfer to [facility]. According to Dr. Jenene Slicker has accepted this patient for potential transfer today via [place]. Dr. Hampton Abbot agrees to entering EMTALA and notifying Unit Janeece Riggers of need for medical records. Unit clerk/nurse can call [facility] for availability to transfer when unit receives call from [facility] that they are ready for patient. [facility] provider Dr. Threasa Heads. [facility] updated of potential need for transfer today.

## 2016-11-23 NOTE — Op Note (Signed)
  Procedure Date:  11/23/2016  Pre-operative Diagnosis:  Open abdomen  Post-operative Diagnosis:  Open abdomen  Procedure:  Exam under Anesthesia, Abdominal wound vac change  Surgeon:  Melvyn Neth, MD  Anesthesia:  General endotracheal  Estimated Blood Loss:  5 ml  Specimens:  None  Complications:  None  Indications for Procedure:  This is a 56 y.o. 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  He is taken back to the OR for another wound vac change and examination of his fascial edges.  Description of Procedure: The patient was correctly identified in the ICU and brought into the operating room.  The patient was placed supine with VTE prophylaxis in place.  Appropriate time-outs were performed.  Anesthesia was induced.    The patient's wound vac sponge and outer dressing were removed.  The abdomen was prepped with betadine and draped with sterile towels.  The innermost layer of the wound vac was removed, exposing the patient's abdominal contents.  There was no purulent drainage noted and no gross evidence of any bowel injury or compromise.  The bowels were covered with flimsy layer of fibrinous material.  The fascial edges were examined over both right and left components of the wound.  Fingerbreaking blunt dissection was done in order to free up the edges of the fascia.  The right side of the wound showed fairly accessible fascial edges, with some retraction laterally.  The left side had retracted significantly, and the edge that had been previously debrided was further lateral.  The superior portion was fairly accessible, revealing prior sutures placed for the partial closure of his fascia on 11/14/16.  The inferior portion of the wound was hard to access, as this was very retracted and his bowels were protruding over this area, and the pelvis  could not be well evaluated.  Overall, the distance between fascial edges was approximately 30 cm transverse and 35 cm longitudinal.  A new wound vac dressing was placed, with careful placement of the inner layer to cover the exposed bowel, followed by the blue foam outer layer.  A separate piece of blue foam was placed in the cavity on the left side of the wound where his infection had been.  Tegaderm was placed to cover the entire abdomen and the suction disc was connected with a good seal created and no leak.  The patient was then brought back to the ICU for further management and in stable condition.  The patient tolerated the procedure well and all counts were correct at the end of the case.   Melvyn Neth, MD

## 2016-11-23 NOTE — Anesthesia Preprocedure Evaluation (Deleted)
Anesthesia Evaluation  Patient identified by MRN, date of birth, ID band Patient awake    Reviewed: Allergy & Precautions, H&P , NPO status , Patient's Chart, lab work & pertinent test results, reviewed documented beta blocker date and time   History of Anesthesia Complications (+) DIFFICULT AIRWAY  Airway Mallampati: Intubated  TM Distance: >3 FB Neck ROM: full    Dental  (+) Teeth Intact   Pulmonary shortness of breath, sleep apnea ,    Pulmonary exam normal breath sounds clear to auscultation- rhonchi (-) wheezing      Cardiovascular hypertension, Pt. on medications (-) CAD, (-) Past MI and (-) Cardiac Stents Normal cardiovascular exam Rhythm:regular Rate:Normal - Systolic murmurs and - Diastolic murmurs    Neuro/Psych PSYCHIATRIC DISORDERS Depression negative neurological ROS     GI/Hepatic negative GI ROS, Neg liver ROS,   Endo/Other  negative endocrine ROSneg diabetes  Renal/GU negative Renal ROS  negative genitourinary   Musculoskeletal   Abdominal (+) + obese,   Peds negative pediatric ROS (+)  Hematology negative hematology ROS (+)   Anesthesia Other Findings Past Medical History: No date: Blood in stool No date: Difficult intubation No date: Glaucoma     Comment: since 2004 No date: Hypertension     Comment: since 2000 No date: Obesity, unspecified 2011: Ulcer     Comment: gastric No date: Unspecified hemorrhoids without mention of com* Past Surgical History: 12/22/5091: APPLICATION OF WOUND VAC N/A     Comment: Procedure: APPLICATION OF WOUND VAC-ABDOMINAL;              Surgeon: Florene Glen, MD;  Location: [facility]              ORS;  Service: General;  Laterality: N/A; 06/23/2016: CARDIAC CATHETERIZATION Left     Comment: Procedure: Left Heart Cath and Coronary               Angiography;  Surgeon: Nelva Bush, MD;                Location: [facility];  Service:   Cardiovascular;  Laterality: Left; 1992: CHOLECYSTECTOMY 05/22/2010: COLONOSCOPY W/ POLYPECTOMY     Comment: 23mmtransverse colon polyp, traditional               serrated adenoma,negative for high grade               dysplasia & malignancy. rectal               polyp-20mm,negative for dysplasia & malignancy. 2010: KNEE SURGERY Left 10/30/2016: LAPAROSCOPIC APPENDECTOMY N/A     Comment: Procedure: APPENDECTOMY LAPAROSCOPIC changed                to open application of wound vac;  Surgeon:               [name], MD;  Location: [facility] ORS;                Service: General;  Laterality: N/A; 11/11/2016: LAPAROTOMY N/A     Comment: Procedure: EXPLORATORY LAPAROTOMY, DEBRIDEMENT              OF ABDOMINAL WOUND, ABDOMINAL [place];                Surgeon: [name], MD;  Location: [facility]               ORS;  Service: General;  Laterality: N/A; 11/12/2016: LAPAROTOMY N/A     Comment: Procedure:  EXPLORATORY LAPAROTOMY;  Surgeon:               Clayburn Pert, MD;  Location: [facility] ORS;                Service: General;  Laterality: N/A; 11/14/2016: LAPAROTOMY N/A     Comment: Procedure: EXPLORATORY LAPAROTOMY; Irrigation,              partial closure;  Surgeon: Clayburn Pert,               MD;  Location: [facility] ORS;  Service: General;                Laterality: N/A; BMI    Body Mass Index:  41.91 kg/m     Reproductive/Obstetrics negative OB ROS                             Anesthesia Physical  Anesthesia Plan  ASA: IV  Anesthesia Plan: General ETT   Post-op Pain Management:    Induction:   Airway Management Planned: Oral ETT  Additional Equipment:   Intra-op Plan:   Post-operative Plan: Post-operative intubation/ventilation  Informed Consent: I have reviewed the patients History and Physical, chart, labs and discussed the procedure including the risks, benefits and alternatives for the proposed anesthesia with the patient or authorized representative  who has indicated his/her understanding and acceptance.   Dental Advisory Given and Consent reviewed with POA  Plan Discussed with: CRNA and Anesthesiologist  Anesthesia Plan Comments: (Wife consented at bedside in ICU )        Anesthesia Quick Evaluation

## 2016-11-23 NOTE — Anesthesia Procedure Notes (Signed)
Performed by: Lance Muss Pre-anesthesia Checklist: Patient identified, Emergency Drugs available, Suction available, Patient being monitored and Timeout performed Patient Re-evaluated:Patient Re-evaluated prior to inductionOxygen Delivery Method: Ambu bag Preoxygenation: Pre-oxygenation with 100% oxygen Intubation Type: Inhalational induction with existing ETT Tube size: 7.5 mm Secured at: 21 cm Tube secured with: Tape

## 2016-11-23 NOTE — Anesthesia Post-op Follow-up Note (Signed)
Anesthesia QCDR form completed.        

## 2016-11-23 NOTE — Progress Notes (Signed)
Patient taken to OR w/ Dr. Randa Lynn (anesthesia) bagging the patient an OR tech operating bed. Same MD d/c'd Fentanyl gtt prior to going to OR. Chart given to CRNA in OR. Patient care turned over to OR staff.

## 2016-11-23 NOTE — Progress Notes (Signed)
Pt returned from OR in stable condition. Family and Dr.Pascoya at bedside. Full assessment in EPIC

## 2016-11-23 NOTE — Progress Notes (Signed)
Dr. Hampton Abbot called and notified this RN that pt will be transferring to [facility] today. [facility] Bed control called Pt bed number Surgical ICU 2704. Hand off report given to [facility] and the Multimedia programmer.  Pt wife aware. Will continue to assess.

## 2016-11-23 NOTE — Progress Notes (Signed)
Pt transported to [facility] transport team. Pt in stable condition, all belongings left with pt wife.

## 2016-11-23 NOTE — Progress Notes (Signed)
PULMONARY / CRITICAL CARE MEDICINE   Name: Peter [name] MRN: [id number] DOB: 06-06-1961    ADMISSION DATE:  11/11/2016  PT PROFILE:   56 y.o. male underwent open appendectomy 04/28 and discharged 11/07/16. Readmitted 05/10 with wound dehiscence and necrotizing fasciitis. Wound debrided and wound vac placed. Admitted to ICU intubated with severe sepsis with shock. Has returned to OR multiple times for debridements and washouts  MAJOR EVENTS/TEST RESULTS: 05/10 Admitted to ICU intubated with severe sepsis with shock 05/10 CTAP: Open ventral lower abdominal wall wound and broad-based left lower quadrant abdominal wall hernia at the deep margin of the open wound measuring approximately 13 x 15 cm. Through the herniation extending into the open wound are loops of small and large bowel which demonstrate wall thickening and surrounding inflammatory changes. There is proximal mild small bowel obstruction. Fat stranding in the right lower quadrant appendectomy bed may represent phlegmonous or postsurgical changes. No peritoneal abscess identified. 05/11 Return to OR for wound vac change 05/13 Return to OR for wound vac change 05/15 Return to OR for wound vac change 05/17 Return to OR for wound vac change 05/20 Return to OR for wound vac change 05/22 Return to OR for wound vac change. Trach tube placement cancelled - scheduled for 05/23   INDWELLING DEVICES:: ETT 05/10 >>  L IJ CVL 05/10 >> 05/19 RUE PICC 05/19 >>   MICRO DATA: MRSA PCR 05/10 >> NEG Resp 05/13 >> few candida Blood 05/10 >> NEG Wound 05/10 >> Klebs, actinomyces, bacteroides, enterobacter  ANTIMICROBIALS:  Pip-tazo 05/10 >>     SUBJECTIVE:  RASS -4, repeatedly agitated and biting on ETT on WUA, not F/C. Blood specimens lipemic  VITAL SIGNS: BP 100/63   Pulse (!) 102   Temp 98.5 F (36.9 C) (Axillary)   Resp 17   Ht 6' (1.829 m)   Wt (!) 355 lb (161 kg)   SpO2 99%   BMI 48.15 kg/m   HEMODYNAMICS:     VENTILATOR SETTINGS: Vent Mode: PRVC FiO2 (%):  [30 %] 30 % Set Rate:  [16 bmp-24 bmp] 16 bmp Vt Set:  [500 mL] 500 mL PEEP:  [5 cmH20] 5 cmH20 Plateau Pressure:  [12 cmH20-17 cmH20] 17 cmH20  INTAKE / OUTPUT: I/O last 3 completed shifts: In: 5805.4 [I.V.:2526; Other:80; NG/GT:2499.3; IV Piggyback:700] Out: 6506 [Urine:1530; Drains:4975; Stool:1]  PHYSICAL EXAMINATION: General: intubated, sedated, not F/C Neuro: PERRL, EOMI, MAEs HEENT: NCAT, sclerae white Cardiovascular: reg, no M Lungs: clear anteriorly Abdomen: minimally distended, soft, diminished BS, wound vac in place Ext: symmetric ankle edema Skin: no lesions noted  LABS:  BMET  Recent Labs Lab 11/21/16 0503 11/22/16 0435 11/23/16 0436  NA 144 140 UNABLE TO PERFORM DUE TO LIPEMIC INTERFERENCE...[place]  K 3.6 3.2* HEMOLYSIS AT THIS LEVEL MAY AFFECT RESULT  CL 111 109 102  CO2 29 28 UNABLE TO PERFORM DUE TO LIPEMIC INTERFERENCE...[place]  BUN 28* 32* 38*  CREATININE 0.73 0.97 0.87  GLUCOSE 93 109* 114*    Electrolytes  Recent Labs Lab 11/19/16 0429  11/21/16 0503 11/22/16 0435 11/23/16 0436  CALCIUM 7.4*  < > 7.7* 7.2* UNABLE TO PERFORM DUE TO LIPEMIC INTERFERENCE...[place]  MG 1.9  < > 2.0 1.9 UNABLE TO PERFORM DUE TO LIPEMIC INTERFERENCE...[place]  PHOS 4.1  --  3.5  --  3.9  < > = values in this interval not displayed.  CBC  Recent Labs Lab 11/18/16 0351 11/19/16 0429 11/23/16 0436  WBC 10.7* 9.3 8.8  HGB 8.5* 9.2*  9.9*  HCT 25.0* 27.0* 27.9*  PLT 353 316 214    Coag's No results for input(s): APTT, INR in the last 168 hours.  Sepsis Markers No results for input(s): LATICACIDVEN, PROCALCITON, O2SATVEN in the last 168 hours.  ABG No results for input(s): PHART, PCO2ART, PO2ART in the last 168 hours.  Liver Enzymes  Recent Labs Lab 11/16/16 1852 11/19/16 0429 11/23/16 0436  AST 46*  --  52*  ALT 24  --  UNABLE TO PERFORM DUE TO LIPEMIC INTERFERENCE...[place]  ALKPHOS 63  --  145*  BILITOT  1.3*  --  1.8*  ALBUMIN 1.4* 1.2* UNABLE TO PERFORM DUE TO LIPEMIC INTERFERENCE...[facility]    Cardiac Enzymes No results for input(s): TROPONINI, PROBNP in the last 168 hours.  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 P:   Cont vent support - settings reviewed and/or adjusted Cont vent bundle Daily SBT if/when meets criteria Trach tube planned 05/23  CARDIOVASCULAR A:  Septic shock, resolved P:  MAP goal 65 mmHg Vasopressors as needed  RENAL A:   AKI, resolved Recurrent hypokalemia P:   Monitor BMET intermittently Monitor I/Os Correct electrolytes as indicated  GASTROINTESTINAL A:   Post laparotomy (recurrent) J tube present P:   SUP: enteral famotidine Cont TF protocol  HEMATOLOGIC A:   Anemia of acute illness without acute blood loss P:  DVT px: enoxaparin Monitor CBC intermittently Transfuse per usual guidelines  INFECTIOUS A:   Severe sepsis Necrotizing fasciitis, polymicrobial P:   Monitor temp, WBC count Micro and abx as above  ENDOCRINE A:   No acute issues P:   Monitor CBGs Consider SSI for glu > 180  NEUROLOGIC A:   ICU/vent associated discomfort Post op pain P:   RASS goal: -1, -2 Cont PAD protocol with fentanyl, PRN midazolam DC propofol due to lipemia  Check lipid panel, lipase Once trach tube placed, will try to transition to intermittent meds   CCM time: 30 mins The above time includes time spent in consultation with patient and/or family members and reviewing care plan on multidisciplinary rounds  Merton Border, MD PCCM service Mobile [phone] Pager [phone]  11/23/2016, 11:00 AM

## 2016-11-23 NOTE — Progress Notes (Signed)
Nutrition Follow-up  DOCUMENTATION CODES:   Morbid obesity  INTERVENTION:  1. Patient now off propofol, increase VHP to 80mL/hr, provides 1920 calories, 168gm protein, 1605cc free water  NUTRITION DIAGNOSIS:   Inadequate oral intake related to inability to eat as evidenced by NPO status. -ongoing  GOAL:   Provide needs based on ASPEN/SCCM guidelines -meeting  MONITOR:   I & O's, Labs, Skin, Vent status  REASON FOR ASSESSMENT:   Ventilator    ASSESSMENT:   Peter [name] is a 56 y.o. male who had a laparotomy and appendectomy 4/28 returns now with eviscerated bowel s/p ex lap, abd wash out, abd wall debridement  Patient is currently intubated on ventilator support MV: 10 L/min Temp (24hrs), Avg:98.5 F (36.9 C), Min:98.4 F (36.9 C), Max:98.7 F (37.1 C) Propofol: None Wound vac change and tracheostomy toady per surgery Has not had a BM since 5/3, hypoactive bowel sounds Labs and medications reviewed: KCL Fentanyl gtt, LR @ 27mL/hr  Diet Order:  Diet NPO time specified  Skin:  Reviewed, no issues  Last BM:  11/04/2016  Height:   Ht Readings from Last 1 Encounters:  11/11/16 6' (1.829 m)    Weight:   Wt Readings from Last 1 Encounters:  11/23/16 (!) 355 lb (161 kg)    Ideal Body Weight:  67.27 kg  BMI:  Body mass index is 48.15 kg/m.  Estimated Nutritional Needs:   Kcal:  8309-4076 calories  Protein:  >/= 168 gm  Fluid:  Per MD/NP/PA  EDUCATION NEEDS:   Education needs no appropriate at this time  Satira Anis. Bruce Mayers, MS, RD LDN Inpatient Clinical Dietitian Pager [phone]

## 2016-11-23 NOTE — Discharge Summary (Addendum)
Patient ID: RYOMA NOFZIGER MRN: [id number] DOB/AGE: [birth year] 56 y.o.  Admit date: 11/11/2016 Discharge date: 11/23/2016   Discharge Diagnoses:  Active Problems:   Evisceration of bowel   Necrotizing soft tissue infection   Open abdominal incision with drainage   Procedures: 1.  Exploratory laparotomy; abdominal wall debridement of skin, subcutaneous tissue, muscle, and fascia; Abthera wound vac placement 2.  Multiple take-backs to OR for wound exploration, debridement of necrosed tissue, and Abthera wound vac change 3.  Multiple take-backs to OR for exam under anesthesia and Abthera wound vac change  Hospital Course:  Patient was admitted on 11/11/16 with abdominal wound evisceration, s/p prior laparoscopic converted to open appendectomy with Dr. Dahlia Byes on 10/30/16.  He was taken to the OR that same day and was found to have necrotizing soft tissue infection involving the fascia, which caused failure of his abdominal closure and subsequent evisceration.  He remained with an open abdomen, with an Abthera wound vac, intubated and sedated in the ICU.  He subsequently went to the OR on 5/11 and 5/13 with Dr. Adonis Huguenin during which he had further debridements of necrosed tissue.  Dr. Adonis Huguenin did a partial closure of the very superior portion of the fascia.  He went to the OR with Dr. Burt Knack on 5/15, 5/17, and 5/20 during which he had some minor debridements and Abthera wound vac changes.  There was minimal debridement needed on 5/20 and he had healthy granulation tissue particularly over the subcutaneous tissue at that point.  He went to the OR with Dr. Hampton Abbot on 5/22, and only required wound vac change at that time.  He was found to have very retracted fascia, particulary on the left portion of his wound where the infection had been, as well as the inferior portion of the wound.  He would require a very complex abdominal closure or bridging and would be a great candidate for tertiary center level of  care for further surgical management.  --Neuro: He has been on Propofol/Fentanyl for pain and sedation, requiring increase in sedation based on agitation and discomfort with the ETT. --Cardiac:  He initially required pressors with Norepinephrine which was discontinued last week.   --Pulmonary:  Has been with ETT (placed 5/10) on PRVC, rate 16, TV 500, PEEP 5, FiO2 30% and stable.  He had been seen by ENT in hospital for planned tracheostomy, which was scheduled for 11/24/16 with Dr. Pryor Ochoa.  Depending on timing of transfer, this may be done at [facility] instead of [facility].   --GI:  He has a GJ Moss tube in place (5/13) and has been on tube feeds.  G tube component has been to suction with volume output of 1L-2L per day.  He may have an ileus but did have a bowel movement on 5/21.  His Abthera wound vac is in place without leaks and a good seal, set at 125 mmHg continuous therapy.   --GU/Renal:  He has a foley catheter in place since and making adequate urine volume.  His creatinine peaked at 1.68 on admission, but normalized after appropriate resuscitation.   --ID:  His cultures from the OR on initial debridement on 11/11/16 have grown few Klebsiella oxytoca, rare Enterobacter cloacae, rare Actinomyces species, and moderate Bacteroides fragilis.  He has been on IV Zosyn and currently has been afebrile, with normal WBC, and no further evidence of necrosis or infection in his wound. --Proph:  He is on Pepcid and Lovenox --Access:  He has a right upper extremity PICC  line placed on 5/19.  Consults: ENT, Critical Care, Infectious Disease, Nutrition  Disposition:  Transfer to [facility] under care of Dr. Threasa Heads  Discharge Instructions    Bed rest    Complete by:  As directed    Call MD for:  difficulty breathing, headache or visual disturbances    Complete by:  As directed    Call MD for:  persistant nausea and vomiting    Complete by:  As directed    Call MD for:  redness, tenderness, or signs of  infection (pain, swelling, redness, odor or green/yellow discharge around incision site)    Complete by:  As directed    Call MD for:  severe uncontrolled pain    Complete by:  As directed    Call MD for:  temperature >100.4    Complete by:  As directed    Change dressing (specify)    Complete by:  As directed    Abthera wound vac -- last vac change on 11/23/16, due for change 11/25/16   Discharge instructions    Complete by:  As directed    Patient is being transferred to [facility] in [place] under Dr. Wells Guiles [place].  She and the surgical team will continue the patient's care from now on and assist in his abdominal closure and management.     Allergies as of 11/23/2016      Reactions   Isosorbide    Headache, nausea, vomiting      Medication List    STOP taking these medications   spironolactone 50 MG tablet Commonly known as:  ALDACTONE     TAKE these medications   amLODipine 10 MG tablet Commonly known as:  NORVASC TAKE 1 TABLET BY MOUTH EVERY DAY   aspirin EC 81 MG tablet Take 81 mg by mouth daily.   atorvastatin 40 MG tablet Commonly known as:  LIPITOR Take 1 tablet (40 mg total) by mouth daily. What changed:  when to take this   chlorhexidine gluconate (MEDLINE KIT) 0.12 % solution Commonly known as:  PERIDEX 15 mLs by Mouth Rinse route 2 (two) times daily.   clobetasol cream 0.05 % Commonly known as:  TEMOVATE Apply 1 application topically at bedtime as needed.   EQUETRO 200 MG Cp12 12 hr capsule Generic drug:  carbamazepine Take 400-600 mg by mouth 2 (two) times daily. Take 400 mg in the morning & 600 mg in the evening.   famotidine 20 MG tablet Commonly known as:  PEPCID Place 1 tablet (20 mg total) into feeding tube 2 (two) times daily.   feeding supplement (VITAL HIGH PROTEIN) Liqd liquid Place 1,000 mLs into feeding tube continuous.   fentaNYL Soln Commonly known as:  SUBLIMAZE Inject 50 mcg into the vein every hour as needed.   fentaNYL  10 mcg/ml Soln infusion Inject 25-400 mcg/hr into the vein continuous.   fluticasone 50 MCG/ACT nasal spray Commonly known as:  FLONASE Place 2 sprays into both nostrils daily. What changed:  when to take this  reasons to take this   free water Soln Place 200 mLs into feeding tube every 8 (eight) hours.   Garlic 409 MG Tabs Take 100 mg by mouth 2 (two) times daily.   hydrALAZINE 25 MG tablet Commonly known as:  APRESOLINE TAKE 1 TABLET(25 MG) BY MOUTH THREE TIMES DAILY   ibuprofen 200 MG tablet Commonly known as:  ADVIL,MOTRIN Take 800 mg by mouth 3 (three) times daily as needed for headache or moderate  pain.   insulin aspart 100 UNIT/ML injection Commonly known as:  novoLOG Inject 0-15 Units into the skin every 4 (four) hours.   ipratropium-albuterol 0.5-2.5 (3) MG/3ML Soln Commonly known as:  DUONEB Take 3 mLs by nebulization every 6 (six) hours.   lactated ringers infusion Inject 20 mLs into the vein continuous.   lamoTRIgine 200 MG tablet Commonly known as:  LAMICTAL Take 400 mg by mouth at bedtime.   latanoprost 0.005 % ophthalmic solution Commonly known as:  XALATAN Place 1 drop into both eyes at bedtime.   lip balm Oint Apply 1 application topically as needed for lip care.   losartan 100 MG tablet Commonly known as:  COZAAR TAKE 1 TABLET BY MOUTH EVERY DAY   LUMIGAN 0.01 % Soln Generic drug:  bimatoprost Place 1 drop into both eyes at bedtime.   MAGNESIUM-OXIDE 400 (241.3 Mg) MG tablet Generic drug:  magnesium oxide Take 400 mg by mouth 2 (two) times daily.   midazolam 2 MG/2ML Soln injection Commonly known as:  VERSED Inject 2-4 mLs (2-4 mg total) into the vein every 2 (two) hours as needed for agitation (to maintain RASS goal.).   mouth rinse Liqd solution 15 mLs by Mouth Rinse route every 2 (two) hours as needed.   Nebivolol HCl 20 MG Tabs Commonly known as:  BYSTOLIC Take 1 tablet (20 mg total) by mouth every evening.   OMEGA-3 FATTY  ACIDS PO Take 1 capsule by mouth 2 (two) times daily.   ondansetron 4 MG/2ML Soln injection Commonly known as:  ZOFRAN Inject 2 mLs (4 mg total) into the vein every 6 (six) hours as needed for nausea.   oxyCODONE 5 MG immediate release tablet Commonly known as:  Oxy IR/ROXICODONE Take 1-2 tablets (5-10 mg total) by mouth every 4 (four) hours as needed for severe pain.   piperacillin-tazobactam 4.5 g in dextrose 5 % 100 mL Inject 4.5 g into the vein every 8 (eight) hours.   potassium chloride 20 MEQ/15ML (10%) Soln Place 30 mLs (40 mEq total) into feeding tube 2 (two) times daily.   potassium chloride SA 20 MEQ tablet Commonly known as:  K-DUR,KLOR-CON Take 2 tablets (40 mEq total) by mouth 3 (three) times daily.   ranitidine 300 MG tablet Commonly known as:  ZANTAC TAKE 1 TABLET(300 MG) BY MOUTH TWICE DAILY   RUTIN (MISC. NUTRITIONAL FACTORS) TAKE 1 TABLET (500 MG) BY MOUTH DAILY   sertraline 100 MG tablet Commonly known as:  ZOLOFT TAKE 1 TABLET BY MOUTH TWICE DAILY What changed:  See the new instructions.   sodium chloride flush 0.9 % Soln Commonly known as:  NS 10-40 mLs by Intracatheter route every 12 (twelve) hours.   sodium chloride flush 0.9 % Soln Commonly known as:  NS 10-40 mLs by Intracatheter route as needed (flush).   vitamin C 500 MG tablet Commonly known as:  ASCORBIC ACID Take 500 mg by mouth every evening.   Vitamin D (Ergocalciferol) 50000 units Caps capsule Commonly known as:  DRISDOL Take 1 capsule (50,000 Units total) by mouth every 7 (seven) days.      Follow-up Information    [place]. Schedule an appointment as soon as possible for a visit.   Specialty:  Nutrition & Diabetics Contact information: [place] [id number] [place] [address] [phone]       [facility] Follow up.   Why:  As needed Contact information: [place]  [place] [phone]

## 2016-11-23 NOTE — Transfer of Care (Signed)
Immediate Anesthesia Transfer of Care Note  Patient: Peter [name]  Procedure(s) Performed: Procedure(s) with comments: WOUND VAC CHANGE (N/A) - wound vac application  Patient Location: ICU  Anesthesia Type:General  Level of Consciousness: sedated  Airway & Oxygen Therapy: Patient remains intubated per anesthesia plan  Post-op Assessment: Report given to RN and Post -op Vital signs reviewed and stable  Post vital signs: Reviewed and stable  Last Vitals:  Vitals:   11/23/16 1200 11/23/16 1300  BP: 129/74 (!) 147/86  Pulse: (!) 111 (!) 111  Resp: (!) 21 (!) 31  Temp: 37 C     Last Pain:  Vitals:   11/23/16 1200  TempSrc: Axillary  PainSc:          Complications: No apparent anesthesia complications

## 2016-11-23 NOTE — Progress Notes (Signed)
11/23/2016  Subjective: No acute events overnight.  Tube feeds not stopped until 7 am.  Tracheostomy canceled for today but will continue with wound vac change this afternoon.    Vital signs: Temp:  [98.4 F (36.9 C)-98.7 F (37.1 C)] 98.5 F (36.9 C) (05/22 0800) Pulse Rate:  [89-121] 103 (05/22 0900) Resp:  [14-29] 17 (05/22 0900) BP: (98-166)/(58-110) 106/60 (05/22 0900) SpO2:  [95 %-100 %] 99 % (05/22 0900) FiO2 (%):  [30 %] 30 % (05/22 0800) Weight:  [161 kg (355 lb)] 161 kg (355 lb) (05/22 0359)   Intake/Output: 05/21 0701 - 05/22 0700 In: 4051.1 [I.V.:1791.7; BS/JG:2836.6; IV Piggyback:200] Out: 2947 [MLYYT:0354; Drains:3575; Stool:1] Last BM Date: 11/22/16  Physical Exam: Constitutional: No acute distress, sedated, intubated. Cardiac:  Regular rhythm and rate with intermittent low grade sinus tachycardia. Pulm:  Lungs clear bilaterally, intubated. Abdomen:  Soft, obese, with Apthera wound vac in place to suction and no leaks.  GJ tube in place, with G tube to suction and J tube feeds held. GU:  Foley in place.  Labs:   Recent Labs  11/23/16 0436  WBC 8.8  HGB 9.9*  HCT 27.9*  PLT 214    Recent Labs  11/22/16 0435 11/23/16 0436  NA 140 UNABLE TO PERFORM DUE TO LIPEMIC INTERFERENCE...[place]  K 3.2* HEMOLYSIS AT THIS LEVEL MAY AFFECT RESULT  CL 109 102  CO2 28 UNABLE TO PERFORM DUE TO LIPEMIC INTERFERENCE...[facility]  GLUCOSE 109* 114*  BUN 32* 38*  CREATININE 0.97 0.87  CALCIUM 7.2* UNABLE TO PERFORM DUE TO LIPEMIC INTERFERENCE...[place]   No results for input(s): LABPROT, INR in the last 72 hours.  Imaging: Dg Abd 1 View  Result Date: 11/22/2016 CLINICAL DATA:  Abdominal pain. EXAM: ABDOMEN - 1 VIEW COMPARISON:  11/19/2016 FINDINGS: Exam limited by patient's body habitus. The visualized lung bases are grossly clear. The bowel gas pattern is unremarkable. No dilated loops of bowel to suggest small bowel obstruction or ileus. No free air. The soft tissue shadows are  grossly maintained. The bony structures are grossly normal. IMPRESSION: Limited examination but no definite findings for acute abdominal process. Electronically Signed   By: Marijo Sanes M.D.   On: 11/22/2016 11:44   Dg Chest Port 1 View  Result Date: 11/23/2016 CLINICAL DATA:  56 year old male with respiratory failure. Subsequent encounter. EXAM: PORTABLE CHEST 1 VIEW COMPARISON:  11/15/2016. FINDINGS: Endotracheal tube tip midline 4.4 cm above the carina. Removal left central line with placement right PICC line with the tip at the level of the proximal superior vena cava. Poor inspiration with elevated right hemidiaphragm with right base parenchymal changes suggestive of atelectasis rather than infiltrate. Central pulmonary vascular prominence without pulmonary edema. No pneumothorax. Heart size top-normal. Slightly prominent aortic knob. IMPRESSION: Poor inspiratory exam with right base parenchymal changes suggestive of atelectasis rather than infiltrate. Removal of left central line. Placement right PICC line with tip at the level proximal superior vena cava. Electronically Signed   By: Genia Del M.D.   On: 11/23/2016 06:32    Assessment/Plan: 56 yo male with necrotizing soft tissue infection of midline incision resulting in fascial necrosis and abdominal evisceration.  --Will take patient to OR today for wound vac change and inspection of the patient's fascia.  Will plan to take measurements for sizing of his defect and to see what size bio mesh is needed for abdominal closure later this week.  Discussed with his wife at bedside.   Melvyn Neth, [place]

## 2016-11-24 SURGERY — CREATION, TRACHEOSTOMY
Anesthesia: Choice

## 2016-11-24 NOTE — Anesthesia Postprocedure Evaluation (Signed)
Anesthesia Post Note  Patient: Peter [name]  Procedure(s) Performed: Procedure(s) (LRB): WOUND VAC CHANGE (N/A)  Patient location during evaluation: Other Anesthesia Type: General Anesthetic complications: no Comments: Pt transferred to outside facility     Last Vitals:  Vitals:   11/23/16 1600 11/23/16 1700  BP: (!) 138/100 (!) 157/95  Pulse: (!) 118 (!) 123  Resp: (!) 23 (!) 23  Temp: 37 C     Last Pain:  Vitals:   11/23/16 1600  TempSrc: Axillary  PainSc:                  Brantley Fling

## 2016-11-26 DIAGNOSIS — Z9889 Other specified postprocedural states: Secondary | ICD-10-CM

## 2016-11-26 HISTORY — DX: Other specified postprocedural states: Z98.890

## 2017-01-02 DIAGNOSIS — I10 Essential (primary) hypertension: Secondary | ICD-10-CM | POA: Diagnosis not present

## 2017-01-02 DIAGNOSIS — K922 Gastrointestinal hemorrhage, unspecified: Secondary | ICD-10-CM | POA: Diagnosis not present

## 2017-01-02 DIAGNOSIS — E871 Hypo-osmolality and hyponatremia: Secondary | ICD-10-CM | POA: Diagnosis not present

## 2017-01-02 DIAGNOSIS — R188 Other ascites: Secondary | ICD-10-CM | POA: Diagnosis not present

## 2017-01-02 DIAGNOSIS — B9689 Other specified bacterial agents as the cause of diseases classified elsewhere: Secondary | ICD-10-CM | POA: Diagnosis not present

## 2017-01-02 DIAGNOSIS — E46 Unspecified protein-calorie malnutrition: Secondary | ICD-10-CM | POA: Diagnosis not present

## 2017-01-02 DIAGNOSIS — M726 Necrotizing fasciitis: Secondary | ICD-10-CM | POA: Diagnosis not present

## 2017-01-02 DIAGNOSIS — R1310 Dysphagia, unspecified: Secondary | ICD-10-CM | POA: Diagnosis not present

## 2017-01-02 DIAGNOSIS — D62 Acute posthemorrhagic anemia: Secondary | ICD-10-CM | POA: Diagnosis not present

## 2017-01-02 DIAGNOSIS — E2609 Other primary hyperaldosteronism: Secondary | ICD-10-CM | POA: Diagnosis not present

## 2017-01-02 DIAGNOSIS — R7881 Bacteremia: Secondary | ICD-10-CM | POA: Diagnosis not present

## 2017-01-02 DIAGNOSIS — L03311 Cellulitis of abdominal wall: Secondary | ICD-10-CM | POA: Diagnosis not present

## 2017-01-02 DIAGNOSIS — B961 Klebsiella pneumoniae [K. pneumoniae] as the cause of diseases classified elsewhere: Secondary | ICD-10-CM | POA: Diagnosis not present

## 2017-01-02 DIAGNOSIS — B952 Enterococcus as the cause of diseases classified elsewhere: Secondary | ICD-10-CM | POA: Diagnosis not present

## 2017-01-02 DIAGNOSIS — N179 Acute kidney failure, unspecified: Secondary | ICD-10-CM | POA: Diagnosis not present

## 2017-01-02 DIAGNOSIS — B964 Proteus (mirabilis) (morganii) as the cause of diseases classified elsewhere: Secondary | ICD-10-CM | POA: Diagnosis not present

## 2017-01-02 DIAGNOSIS — Z431 Encounter for attention to gastrostomy: Secondary | ICD-10-CM | POA: Diagnosis not present

## 2017-01-02 DIAGNOSIS — J9600 Acute respiratory failure, unspecified whether with hypoxia or hypercapnia: Secondary | ICD-10-CM | POA: Diagnosis not present

## 2017-01-02 DIAGNOSIS — J9621 Acute and chronic respiratory failure with hypoxia: Secondary | ICD-10-CM | POA: Diagnosis not present

## 2017-01-03 DIAGNOSIS — S31109A Unspecified open wound of abdominal wall, unspecified quadrant without penetration into peritoneal cavity, initial encounter: Secondary | ICD-10-CM | POA: Diagnosis not present

## 2017-01-03 DIAGNOSIS — I10 Essential (primary) hypertension: Secondary | ICD-10-CM | POA: Diagnosis not present

## 2017-01-03 DIAGNOSIS — F319 Bipolar disorder, unspecified: Secondary | ICD-10-CM | POA: Diagnosis not present

## 2017-01-03 DIAGNOSIS — J9600 Acute respiratory failure, unspecified whether with hypoxia or hypercapnia: Secondary | ICD-10-CM | POA: Diagnosis not present

## 2017-01-04 ENCOUNTER — Encounter: Payer: Self-pay | Admitting: General Surgery

## 2017-01-04 DIAGNOSIS — F319 Bipolar disorder, unspecified: Secondary | ICD-10-CM | POA: Diagnosis not present

## 2017-01-04 DIAGNOSIS — I10 Essential (primary) hypertension: Secondary | ICD-10-CM | POA: Diagnosis not present

## 2017-01-04 DIAGNOSIS — Z452 Encounter for adjustment and management of vascular access device: Secondary | ICD-10-CM | POA: Diagnosis not present

## 2017-01-04 DIAGNOSIS — J9811 Atelectasis: Secondary | ICD-10-CM | POA: Diagnosis not present

## 2017-01-04 DIAGNOSIS — J9600 Acute respiratory failure, unspecified whether with hypoxia or hypercapnia: Secondary | ICD-10-CM | POA: Diagnosis not present

## 2017-01-04 DIAGNOSIS — L02211 Cutaneous abscess of abdominal wall: Secondary | ICD-10-CM | POA: Diagnosis not present

## 2017-01-04 DIAGNOSIS — S31109A Unspecified open wound of abdominal wall, unspecified quadrant without penetration into peritoneal cavity, initial encounter: Secondary | ICD-10-CM | POA: Diagnosis not present

## 2017-01-05 DIAGNOSIS — D62 Acute posthemorrhagic anemia: Secondary | ICD-10-CM | POA: Diagnosis not present

## 2017-01-05 DIAGNOSIS — J9811 Atelectasis: Secondary | ICD-10-CM | POA: Diagnosis not present

## 2017-01-05 DIAGNOSIS — I10 Essential (primary) hypertension: Secondary | ICD-10-CM | POA: Diagnosis not present

## 2017-01-05 DIAGNOSIS — F319 Bipolar disorder, unspecified: Secondary | ICD-10-CM | POA: Diagnosis not present

## 2017-01-05 DIAGNOSIS — K219 Gastro-esophageal reflux disease without esophagitis: Secondary | ICD-10-CM | POA: Diagnosis not present

## 2017-01-05 DIAGNOSIS — L89899 Pressure ulcer of other site, unspecified stage: Secondary | ICD-10-CM | POA: Diagnosis not present

## 2017-01-05 DIAGNOSIS — F3110 Bipolar disorder, current episode manic without psychotic features, unspecified: Secondary | ICD-10-CM | POA: Diagnosis not present

## 2017-01-05 DIAGNOSIS — K922 Gastrointestinal hemorrhage, unspecified: Secondary | ICD-10-CM | POA: Diagnosis not present

## 2017-01-05 DIAGNOSIS — E875 Hyperkalemia: Secondary | ICD-10-CM | POA: Diagnosis not present

## 2017-01-05 DIAGNOSIS — Z6839 Body mass index (BMI) 39.0-39.9, adult: Secondary | ICD-10-CM | POA: Diagnosis not present

## 2017-01-05 DIAGNOSIS — H409 Unspecified glaucoma: Secondary | ICD-10-CM | POA: Diagnosis not present

## 2017-01-05 DIAGNOSIS — S31109A Unspecified open wound of abdominal wall, unspecified quadrant without penetration into peritoneal cavity, initial encounter: Secondary | ICD-10-CM | POA: Diagnosis not present

## 2017-01-05 DIAGNOSIS — K264 Chronic or unspecified duodenal ulcer with hemorrhage: Secondary | ICD-10-CM | POA: Diagnosis not present

## 2017-01-05 DIAGNOSIS — R58 Hemorrhage, not elsewhere classified: Secondary | ICD-10-CM | POA: Diagnosis not present

## 2017-01-05 DIAGNOSIS — K633 Ulcer of intestine: Secondary | ICD-10-CM | POA: Diagnosis not present

## 2017-01-05 DIAGNOSIS — J9600 Acute respiratory failure, unspecified whether with hypoxia or hypercapnia: Secondary | ICD-10-CM | POA: Diagnosis not present

## 2017-01-05 DIAGNOSIS — I2510 Atherosclerotic heart disease of native coronary artery without angina pectoris: Secondary | ICD-10-CM | POA: Diagnosis not present

## 2017-01-05 DIAGNOSIS — K921 Melena: Secondary | ICD-10-CM | POA: Diagnosis not present

## 2017-01-05 DIAGNOSIS — R066 Hiccough: Secondary | ICD-10-CM | POA: Diagnosis not present

## 2017-01-05 DIAGNOSIS — E785 Hyperlipidemia, unspecified: Secondary | ICD-10-CM | POA: Diagnosis not present

## 2017-01-05 DIAGNOSIS — M6281 Muscle weakness (generalized): Secondary | ICD-10-CM | POA: Diagnosis not present

## 2017-01-05 DIAGNOSIS — D72825 Bandemia: Secondary | ICD-10-CM | POA: Diagnosis not present

## 2017-01-05 DIAGNOSIS — F064 Anxiety disorder due to known physiological condition: Secondary | ICD-10-CM | POA: Diagnosis not present

## 2017-01-05 DIAGNOSIS — L02211 Cutaneous abscess of abdominal wall: Secondary | ICD-10-CM | POA: Diagnosis not present

## 2017-01-05 DIAGNOSIS — Z931 Gastrostomy status: Secondary | ICD-10-CM | POA: Diagnosis not present

## 2017-01-05 DIAGNOSIS — Z5189 Encounter for other specified aftercare: Secondary | ICD-10-CM | POA: Diagnosis not present

## 2017-01-05 DIAGNOSIS — E2609 Other primary hyperaldosteronism: Secondary | ICD-10-CM | POA: Diagnosis not present

## 2017-01-05 DIAGNOSIS — K269 Duodenal ulcer, unspecified as acute or chronic, without hemorrhage or perforation: Secondary | ICD-10-CM | POA: Diagnosis not present

## 2017-01-05 DIAGNOSIS — Z452 Encounter for adjustment and management of vascular access device: Secondary | ICD-10-CM | POA: Diagnosis not present

## 2017-01-05 DIAGNOSIS — G4733 Obstructive sleep apnea (adult) (pediatric): Secondary | ICD-10-CM | POA: Diagnosis not present

## 2017-01-05 DIAGNOSIS — Z7401 Bed confinement status: Secondary | ICD-10-CM | POA: Diagnosis not present

## 2017-01-05 DIAGNOSIS — R41841 Cognitive communication deficit: Secondary | ICD-10-CM | POA: Diagnosis not present

## 2017-01-05 DIAGNOSIS — N179 Acute kidney failure, unspecified: Secondary | ICD-10-CM | POA: Diagnosis not present

## 2017-01-05 DIAGNOSIS — E871 Hypo-osmolality and hyponatremia: Secondary | ICD-10-CM | POA: Diagnosis not present

## 2017-01-05 DIAGNOSIS — R7881 Bacteremia: Secondary | ICD-10-CM | POA: Diagnosis not present

## 2017-01-06 DIAGNOSIS — K922 Gastrointestinal hemorrhage, unspecified: Secondary | ICD-10-CM | POA: Diagnosis not present

## 2017-01-06 DIAGNOSIS — Z931 Gastrostomy status: Secondary | ICD-10-CM | POA: Diagnosis not present

## 2017-01-06 DIAGNOSIS — E875 Hyperkalemia: Secondary | ICD-10-CM | POA: Diagnosis not present

## 2017-01-06 DIAGNOSIS — E871 Hypo-osmolality and hyponatremia: Secondary | ICD-10-CM | POA: Diagnosis not present

## 2017-01-06 DIAGNOSIS — R066 Hiccough: Secondary | ICD-10-CM | POA: Diagnosis not present

## 2017-01-06 DIAGNOSIS — K269 Duodenal ulcer, unspecified as acute or chronic, without hemorrhage or perforation: Secondary | ICD-10-CM | POA: Diagnosis not present

## 2017-01-06 DIAGNOSIS — K921 Melena: Secondary | ICD-10-CM | POA: Diagnosis not present

## 2017-01-07 DIAGNOSIS — E871 Hypo-osmolality and hyponatremia: Secondary | ICD-10-CM | POA: Diagnosis not present

## 2017-01-07 DIAGNOSIS — D72825 Bandemia: Secondary | ICD-10-CM | POA: Diagnosis not present

## 2017-01-07 DIAGNOSIS — K922 Gastrointestinal hemorrhage, unspecified: Secondary | ICD-10-CM | POA: Diagnosis not present

## 2017-01-07 DIAGNOSIS — E875 Hyperkalemia: Secondary | ICD-10-CM | POA: Diagnosis not present

## 2017-01-07 DIAGNOSIS — R7881 Bacteremia: Secondary | ICD-10-CM | POA: Diagnosis not present

## 2017-01-08 DIAGNOSIS — R7881 Bacteremia: Secondary | ICD-10-CM | POA: Diagnosis not present

## 2017-01-08 DIAGNOSIS — E875 Hyperkalemia: Secondary | ICD-10-CM | POA: Diagnosis not present

## 2017-01-08 DIAGNOSIS — E871 Hypo-osmolality and hyponatremia: Secondary | ICD-10-CM | POA: Diagnosis not present

## 2017-01-08 DIAGNOSIS — K922 Gastrointestinal hemorrhage, unspecified: Secondary | ICD-10-CM | POA: Diagnosis not present

## 2017-01-09 DIAGNOSIS — E871 Hypo-osmolality and hyponatremia: Secondary | ICD-10-CM | POA: Diagnosis not present

## 2017-01-09 DIAGNOSIS — K921 Melena: Secondary | ICD-10-CM | POA: Diagnosis not present

## 2017-01-09 DIAGNOSIS — D72825 Bandemia: Secondary | ICD-10-CM | POA: Diagnosis not present

## 2017-01-09 DIAGNOSIS — E875 Hyperkalemia: Secondary | ICD-10-CM | POA: Diagnosis not present

## 2017-01-09 DIAGNOSIS — R7881 Bacteremia: Secondary | ICD-10-CM | POA: Diagnosis not present

## 2017-01-10 DIAGNOSIS — K921 Melena: Secondary | ICD-10-CM | POA: Diagnosis not present

## 2017-01-10 DIAGNOSIS — R7881 Bacteremia: Secondary | ICD-10-CM | POA: Diagnosis not present

## 2017-01-10 DIAGNOSIS — E875 Hyperkalemia: Secondary | ICD-10-CM | POA: Diagnosis not present

## 2017-01-10 DIAGNOSIS — E871 Hypo-osmolality and hyponatremia: Secondary | ICD-10-CM | POA: Diagnosis not present

## 2017-01-10 DIAGNOSIS — R58 Hemorrhage, not elsewhere classified: Secondary | ICD-10-CM | POA: Diagnosis not present

## 2017-01-10 DIAGNOSIS — K633 Ulcer of intestine: Secondary | ICD-10-CM | POA: Diagnosis not present

## 2017-01-10 LAB — HM COLONOSCOPY

## 2017-01-11 ENCOUNTER — Encounter: Payer: Self-pay | Admitting: Family Medicine

## 2017-01-11 DIAGNOSIS — E875 Hyperkalemia: Secondary | ICD-10-CM | POA: Diagnosis not present

## 2017-01-11 DIAGNOSIS — E871 Hypo-osmolality and hyponatremia: Secondary | ICD-10-CM | POA: Diagnosis not present

## 2017-01-11 DIAGNOSIS — R7881 Bacteremia: Secondary | ICD-10-CM | POA: Diagnosis not present

## 2017-01-12 ENCOUNTER — Encounter: Payer: Self-pay | Admitting: Family Medicine

## 2017-01-12 DIAGNOSIS — E875 Hyperkalemia: Secondary | ICD-10-CM | POA: Diagnosis not present

## 2017-01-12 DIAGNOSIS — E871 Hypo-osmolality and hyponatremia: Secondary | ICD-10-CM | POA: Diagnosis not present

## 2017-01-12 DIAGNOSIS — L02211 Cutaneous abscess of abdominal wall: Secondary | ICD-10-CM | POA: Diagnosis not present

## 2017-01-12 DIAGNOSIS — R7881 Bacteremia: Secondary | ICD-10-CM | POA: Diagnosis not present

## 2017-01-12 DIAGNOSIS — K921 Melena: Secondary | ICD-10-CM | POA: Diagnosis not present

## 2017-01-13 DIAGNOSIS — E871 Hypo-osmolality and hyponatremia: Secondary | ICD-10-CM | POA: Diagnosis not present

## 2017-01-13 DIAGNOSIS — E875 Hyperkalemia: Secondary | ICD-10-CM | POA: Diagnosis not present

## 2017-01-13 DIAGNOSIS — D72825 Bandemia: Secondary | ICD-10-CM | POA: Diagnosis not present

## 2017-01-13 DIAGNOSIS — R7881 Bacteremia: Secondary | ICD-10-CM | POA: Diagnosis not present

## 2017-01-14 DIAGNOSIS — L89899 Pressure ulcer of other site, unspecified stage: Secondary | ICD-10-CM | POA: Diagnosis not present

## 2017-01-14 DIAGNOSIS — F064 Anxiety disorder due to known physiological condition: Secondary | ICD-10-CM | POA: Diagnosis not present

## 2017-01-14 DIAGNOSIS — Z7401 Bed confinement status: Secondary | ICD-10-CM | POA: Diagnosis not present

## 2017-01-14 DIAGNOSIS — F331 Major depressive disorder, recurrent, moderate: Secondary | ICD-10-CM | POA: Diagnosis not present

## 2017-01-14 DIAGNOSIS — K922 Gastrointestinal hemorrhage, unspecified: Secondary | ICD-10-CM | POA: Diagnosis not present

## 2017-01-14 DIAGNOSIS — G4700 Insomnia, unspecified: Secondary | ICD-10-CM | POA: Diagnosis not present

## 2017-01-14 DIAGNOSIS — Z5189 Encounter for other specified aftercare: Secondary | ICD-10-CM | POA: Diagnosis not present

## 2017-01-14 DIAGNOSIS — F3110 Bipolar disorder, current episode manic without psychotic features, unspecified: Secondary | ICD-10-CM | POA: Diagnosis not present

## 2017-01-14 DIAGNOSIS — R066 Hiccough: Secondary | ICD-10-CM | POA: Diagnosis not present

## 2017-01-14 DIAGNOSIS — M6281 Muscle weakness (generalized): Secondary | ICD-10-CM | POA: Diagnosis not present

## 2017-01-14 DIAGNOSIS — K219 Gastro-esophageal reflux disease without esophagitis: Secondary | ICD-10-CM | POA: Diagnosis not present

## 2017-01-14 DIAGNOSIS — E785 Hyperlipidemia, unspecified: Secondary | ICD-10-CM | POA: Diagnosis not present

## 2017-01-14 DIAGNOSIS — I2510 Atherosclerotic heart disease of native coronary artery without angina pectoris: Secondary | ICD-10-CM | POA: Diagnosis not present

## 2017-01-14 DIAGNOSIS — F3162 Bipolar disorder, current episode mixed, moderate: Secondary | ICD-10-CM | POA: Diagnosis not present

## 2017-01-14 DIAGNOSIS — F419 Anxiety disorder, unspecified: Secondary | ICD-10-CM | POA: Diagnosis not present

## 2017-01-14 DIAGNOSIS — R7881 Bacteremia: Secondary | ICD-10-CM | POA: Diagnosis not present

## 2017-01-14 DIAGNOSIS — I10 Essential (primary) hypertension: Secondary | ICD-10-CM | POA: Diagnosis not present

## 2017-01-14 DIAGNOSIS — E871 Hypo-osmolality and hyponatremia: Secondary | ICD-10-CM | POA: Diagnosis not present

## 2017-01-14 DIAGNOSIS — K921 Melena: Secondary | ICD-10-CM | POA: Diagnosis not present

## 2017-01-14 DIAGNOSIS — R41841 Cognitive communication deficit: Secondary | ICD-10-CM | POA: Diagnosis not present

## 2017-01-14 DIAGNOSIS — F319 Bipolar disorder, unspecified: Secondary | ICD-10-CM | POA: Diagnosis not present

## 2017-01-18 DIAGNOSIS — M6281 Muscle weakness (generalized): Secondary | ICD-10-CM | POA: Diagnosis not present

## 2017-01-18 DIAGNOSIS — F319 Bipolar disorder, unspecified: Secondary | ICD-10-CM | POA: Diagnosis not present

## 2017-01-18 DIAGNOSIS — K922 Gastrointestinal hemorrhage, unspecified: Secondary | ICD-10-CM | POA: Diagnosis not present

## 2017-01-18 DIAGNOSIS — I2510 Atherosclerotic heart disease of native coronary artery without angina pectoris: Secondary | ICD-10-CM | POA: Diagnosis not present

## 2017-01-25 DIAGNOSIS — I10 Essential (primary) hypertension: Secondary | ICD-10-CM | POA: Diagnosis not present

## 2017-01-25 DIAGNOSIS — K922 Gastrointestinal hemorrhage, unspecified: Secondary | ICD-10-CM | POA: Diagnosis not present

## 2017-01-25 DIAGNOSIS — I2510 Atherosclerotic heart disease of native coronary artery without angina pectoris: Secondary | ICD-10-CM | POA: Diagnosis not present

## 2017-01-25 DIAGNOSIS — M6281 Muscle weakness (generalized): Secondary | ICD-10-CM | POA: Diagnosis not present

## 2017-02-02 DIAGNOSIS — G4700 Insomnia, unspecified: Secondary | ICD-10-CM | POA: Diagnosis not present

## 2017-02-02 DIAGNOSIS — F3162 Bipolar disorder, current episode mixed, moderate: Secondary | ICD-10-CM | POA: Diagnosis not present

## 2017-02-02 DIAGNOSIS — F331 Major depressive disorder, recurrent, moderate: Secondary | ICD-10-CM | POA: Diagnosis not present

## 2017-02-02 DIAGNOSIS — F419 Anxiety disorder, unspecified: Secondary | ICD-10-CM | POA: Diagnosis not present

## 2017-02-05 DIAGNOSIS — I2510 Atherosclerotic heart disease of native coronary artery without angina pectoris: Secondary | ICD-10-CM | POA: Diagnosis not present

## 2017-02-05 DIAGNOSIS — M6281 Muscle weakness (generalized): Secondary | ICD-10-CM | POA: Diagnosis not present

## 2017-02-05 DIAGNOSIS — F329 Major depressive disorder, single episode, unspecified: Secondary | ICD-10-CM | POA: Diagnosis not present

## 2017-02-05 DIAGNOSIS — F319 Bipolar disorder, unspecified: Secondary | ICD-10-CM | POA: Diagnosis not present

## 2017-02-07 DIAGNOSIS — F3132 Bipolar disorder, current episode depressed, moderate: Secondary | ICD-10-CM | POA: Diagnosis not present

## 2017-02-07 DIAGNOSIS — F064 Anxiety disorder due to known physiological condition: Secondary | ICD-10-CM | POA: Diagnosis not present

## 2017-02-07 DIAGNOSIS — F3110 Bipolar disorder, current episode manic without psychotic features, unspecified: Secondary | ICD-10-CM | POA: Diagnosis not present

## 2017-02-07 DIAGNOSIS — K5900 Constipation, unspecified: Secondary | ICD-10-CM | POA: Diagnosis not present

## 2017-02-07 DIAGNOSIS — Z434 Encounter for attention to other artificial openings of digestive tract: Secondary | ICD-10-CM | POA: Diagnosis not present

## 2017-02-07 DIAGNOSIS — I10 Essential (primary) hypertension: Secondary | ICD-10-CM | POA: Diagnosis not present

## 2017-02-07 DIAGNOSIS — T814XXD Infection following a procedure, subsequent encounter: Secondary | ICD-10-CM | POA: Diagnosis not present

## 2017-02-07 DIAGNOSIS — K219 Gastro-esophageal reflux disease without esophagitis: Secondary | ICD-10-CM | POA: Diagnosis not present

## 2017-02-07 DIAGNOSIS — Z9181 History of falling: Secondary | ICD-10-CM | POA: Diagnosis not present

## 2017-02-07 DIAGNOSIS — E785 Hyperlipidemia, unspecified: Secondary | ICD-10-CM | POA: Diagnosis not present

## 2017-02-07 DIAGNOSIS — I2510 Atherosclerotic heart disease of native coronary artery without angina pectoris: Secondary | ICD-10-CM | POA: Diagnosis not present

## 2017-02-07 DIAGNOSIS — F3175 Bipolar disorder, in partial remission, most recent episode depressed: Secondary | ICD-10-CM | POA: Diagnosis not present

## 2017-02-07 DIAGNOSIS — H401130 Primary open-angle glaucoma, bilateral, stage unspecified: Secondary | ICD-10-CM | POA: Diagnosis not present

## 2017-02-08 ENCOUNTER — Telehealth: Payer: Self-pay | Admitting: Family Medicine

## 2017-02-08 NOTE — Telephone Encounter (Signed)
Left message for Peter [name] stating that Dr. Caryn Section agreed to visits. sd

## 2017-02-08 NOTE — Telephone Encounter (Signed)
Lana with [facility] called to request a verbal order for skilled nursing home health for abdominal wound care.  2 times a week for 3 weeks, 1 time a week for 5 weeks and 8 visits as needed.  CB#[phone]

## 2017-02-08 NOTE — Telephone Encounter (Signed)
That's fine

## 2017-02-08 NOTE — Telephone Encounter (Signed)
Please review for Dr Rometta Emery

## 2017-02-10 DIAGNOSIS — Z9181 History of falling: Secondary | ICD-10-CM | POA: Diagnosis not present

## 2017-02-10 DIAGNOSIS — K219 Gastro-esophageal reflux disease without esophagitis: Secondary | ICD-10-CM | POA: Diagnosis not present

## 2017-02-10 DIAGNOSIS — I10 Essential (primary) hypertension: Secondary | ICD-10-CM | POA: Diagnosis not present

## 2017-02-10 DIAGNOSIS — F064 Anxiety disorder due to known physiological condition: Secondary | ICD-10-CM | POA: Diagnosis not present

## 2017-02-10 DIAGNOSIS — E785 Hyperlipidemia, unspecified: Secondary | ICD-10-CM | POA: Diagnosis not present

## 2017-02-10 DIAGNOSIS — I2510 Atherosclerotic heart disease of native coronary artery without angina pectoris: Secondary | ICD-10-CM | POA: Diagnosis not present

## 2017-02-10 DIAGNOSIS — H401130 Primary open-angle glaucoma, bilateral, stage unspecified: Secondary | ICD-10-CM | POA: Diagnosis not present

## 2017-02-10 DIAGNOSIS — T814XXD Infection following a procedure, subsequent encounter: Secondary | ICD-10-CM | POA: Diagnosis not present

## 2017-02-10 DIAGNOSIS — F3110 Bipolar disorder, current episode manic without psychotic features, unspecified: Secondary | ICD-10-CM | POA: Diagnosis not present

## 2017-02-10 DIAGNOSIS — K5900 Constipation, unspecified: Secondary | ICD-10-CM | POA: Diagnosis not present

## 2017-02-10 DIAGNOSIS — Z434 Encounter for attention to other artificial openings of digestive tract: Secondary | ICD-10-CM | POA: Diagnosis not present

## 2017-02-11 DIAGNOSIS — Z434 Encounter for attention to other artificial openings of digestive tract: Secondary | ICD-10-CM | POA: Diagnosis not present

## 2017-02-11 DIAGNOSIS — I10 Essential (primary) hypertension: Secondary | ICD-10-CM | POA: Diagnosis not present

## 2017-02-11 DIAGNOSIS — K219 Gastro-esophageal reflux disease without esophagitis: Secondary | ICD-10-CM | POA: Diagnosis not present

## 2017-02-11 DIAGNOSIS — F064 Anxiety disorder due to known physiological condition: Secondary | ICD-10-CM | POA: Diagnosis not present

## 2017-02-11 DIAGNOSIS — E785 Hyperlipidemia, unspecified: Secondary | ICD-10-CM | POA: Diagnosis not present

## 2017-02-11 DIAGNOSIS — I2510 Atherosclerotic heart disease of native coronary artery without angina pectoris: Secondary | ICD-10-CM | POA: Diagnosis not present

## 2017-02-11 DIAGNOSIS — Z9181 History of falling: Secondary | ICD-10-CM | POA: Diagnosis not present

## 2017-02-11 DIAGNOSIS — T814XXD Infection following a procedure, subsequent encounter: Secondary | ICD-10-CM | POA: Diagnosis not present

## 2017-02-11 DIAGNOSIS — F3110 Bipolar disorder, current episode manic without psychotic features, unspecified: Secondary | ICD-10-CM | POA: Diagnosis not present

## 2017-02-11 DIAGNOSIS — H401130 Primary open-angle glaucoma, bilateral, stage unspecified: Secondary | ICD-10-CM | POA: Diagnosis not present

## 2017-02-11 DIAGNOSIS — K5900 Constipation, unspecified: Secondary | ICD-10-CM | POA: Diagnosis not present

## 2017-02-14 ENCOUNTER — Telehealth: Payer: Self-pay | Admitting: Family Medicine

## 2017-02-14 DIAGNOSIS — Z9181 History of falling: Secondary | ICD-10-CM | POA: Diagnosis not present

## 2017-02-14 DIAGNOSIS — K5900 Constipation, unspecified: Secondary | ICD-10-CM | POA: Diagnosis not present

## 2017-02-14 DIAGNOSIS — Z434 Encounter for attention to other artificial openings of digestive tract: Secondary | ICD-10-CM | POA: Diagnosis not present

## 2017-02-14 DIAGNOSIS — I2510 Atherosclerotic heart disease of native coronary artery without angina pectoris: Secondary | ICD-10-CM | POA: Diagnosis not present

## 2017-02-14 DIAGNOSIS — F064 Anxiety disorder due to known physiological condition: Secondary | ICD-10-CM | POA: Diagnosis not present

## 2017-02-14 DIAGNOSIS — T814XXD Infection following a procedure, subsequent encounter: Secondary | ICD-10-CM | POA: Diagnosis not present

## 2017-02-14 DIAGNOSIS — H401130 Primary open-angle glaucoma, bilateral, stage unspecified: Secondary | ICD-10-CM | POA: Diagnosis not present

## 2017-02-14 DIAGNOSIS — E785 Hyperlipidemia, unspecified: Secondary | ICD-10-CM | POA: Diagnosis not present

## 2017-02-14 DIAGNOSIS — I10 Essential (primary) hypertension: Secondary | ICD-10-CM | POA: Diagnosis not present

## 2017-02-14 DIAGNOSIS — F3110 Bipolar disorder, current episode manic without psychotic features, unspecified: Secondary | ICD-10-CM | POA: Diagnosis not present

## 2017-02-14 DIAGNOSIS — K219 Gastro-esophageal reflux disease without esophagitis: Secondary | ICD-10-CM | POA: Diagnosis not present

## 2017-02-14 NOTE — Telephone Encounter (Signed)
Peter [name] with [facility] request a verbal order for occupational therapy 2 times a week for 3 weeks.  RP#[phone]/KM

## 2017-02-14 NOTE — Telephone Encounter (Signed)
Peter [name] with [facility]

## 2017-02-14 NOTE — Telephone Encounter (Signed)
ok 

## 2017-02-14 NOTE — Telephone Encounter (Signed)
Please review-aa 

## 2017-02-15 DIAGNOSIS — Z9181 History of falling: Secondary | ICD-10-CM | POA: Diagnosis not present

## 2017-02-15 DIAGNOSIS — E785 Hyperlipidemia, unspecified: Secondary | ICD-10-CM | POA: Diagnosis not present

## 2017-02-15 DIAGNOSIS — I2510 Atherosclerotic heart disease of native coronary artery without angina pectoris: Secondary | ICD-10-CM | POA: Diagnosis not present

## 2017-02-15 DIAGNOSIS — Z434 Encounter for attention to other artificial openings of digestive tract: Secondary | ICD-10-CM | POA: Diagnosis not present

## 2017-02-15 DIAGNOSIS — K5900 Constipation, unspecified: Secondary | ICD-10-CM | POA: Diagnosis not present

## 2017-02-15 DIAGNOSIS — H401130 Primary open-angle glaucoma, bilateral, stage unspecified: Secondary | ICD-10-CM | POA: Diagnosis not present

## 2017-02-15 DIAGNOSIS — I10 Essential (primary) hypertension: Secondary | ICD-10-CM | POA: Diagnosis not present

## 2017-02-15 DIAGNOSIS — T814XXD Infection following a procedure, subsequent encounter: Secondary | ICD-10-CM | POA: Diagnosis not present

## 2017-02-15 DIAGNOSIS — K219 Gastro-esophageal reflux disease without esophagitis: Secondary | ICD-10-CM | POA: Diagnosis not present

## 2017-02-15 DIAGNOSIS — F3110 Bipolar disorder, current episode manic without psychotic features, unspecified: Secondary | ICD-10-CM | POA: Diagnosis not present

## 2017-02-15 DIAGNOSIS — F064 Anxiety disorder due to known physiological condition: Secondary | ICD-10-CM | POA: Diagnosis not present

## 2017-02-16 ENCOUNTER — Ambulatory Visit (INDEPENDENT_AMBULATORY_CARE_PROVIDER_SITE_OTHER): Payer: BLUE CROSS/BLUE SHIELD | Admitting: Family Medicine

## 2017-02-16 VITALS — BP 122/74 | HR 106 | Temp 99.4°F | Resp 18 | Wt 267.0 lb

## 2017-02-16 DIAGNOSIS — T814XXD Infection following a procedure, subsequent encounter: Secondary | ICD-10-CM | POA: Diagnosis not present

## 2017-02-16 DIAGNOSIS — J40 Bronchitis, not specified as acute or chronic: Secondary | ICD-10-CM | POA: Diagnosis not present

## 2017-02-16 DIAGNOSIS — Z6841 Body Mass Index (BMI) 40.0 and over, adult: Secondary | ICD-10-CM

## 2017-02-16 DIAGNOSIS — Z9049 Acquired absence of other specified parts of digestive tract: Secondary | ICD-10-CM | POA: Diagnosis not present

## 2017-02-16 DIAGNOSIS — K219 Gastro-esophageal reflux disease without esophagitis: Secondary | ICD-10-CM | POA: Diagnosis not present

## 2017-02-16 DIAGNOSIS — Z09 Encounter for follow-up examination after completed treatment for conditions other than malignant neoplasm: Secondary | ICD-10-CM | POA: Diagnosis not present

## 2017-02-16 DIAGNOSIS — Z79899 Other long term (current) drug therapy: Secondary | ICD-10-CM

## 2017-02-16 DIAGNOSIS — I10 Essential (primary) hypertension: Secondary | ICD-10-CM | POA: Diagnosis not present

## 2017-02-16 DIAGNOSIS — F064 Anxiety disorder due to known physiological condition: Secondary | ICD-10-CM | POA: Diagnosis not present

## 2017-02-16 DIAGNOSIS — R110 Nausea: Secondary | ICD-10-CM | POA: Diagnosis not present

## 2017-02-16 DIAGNOSIS — IMO0001 Reserved for inherently not codable concepts without codable children: Secondary | ICD-10-CM

## 2017-02-16 DIAGNOSIS — K5900 Constipation, unspecified: Secondary | ICD-10-CM | POA: Diagnosis not present

## 2017-02-16 DIAGNOSIS — E785 Hyperlipidemia, unspecified: Secondary | ICD-10-CM | POA: Diagnosis not present

## 2017-02-16 DIAGNOSIS — E669 Obesity, unspecified: Secondary | ICD-10-CM

## 2017-02-16 DIAGNOSIS — F3110 Bipolar disorder, current episode manic without psychotic features, unspecified: Secondary | ICD-10-CM | POA: Diagnosis not present

## 2017-02-16 DIAGNOSIS — H401130 Primary open-angle glaucoma, bilateral, stage unspecified: Secondary | ICD-10-CM | POA: Diagnosis not present

## 2017-02-16 DIAGNOSIS — Z434 Encounter for attention to other artificial openings of digestive tract: Secondary | ICD-10-CM | POA: Diagnosis not present

## 2017-02-16 DIAGNOSIS — I2510 Atherosclerotic heart disease of native coronary artery without angina pectoris: Secondary | ICD-10-CM | POA: Diagnosis not present

## 2017-02-16 DIAGNOSIS — Z9181 History of falling: Secondary | ICD-10-CM | POA: Diagnosis not present

## 2017-02-16 MED ORDER — ONDANSETRON 8 MG PO TBDP: 8.0000 mg | ORAL_TABLET | Freq: Three times a day (TID) | ORAL | 0 refills | Status: DC | PRN

## 2017-02-16 MED ORDER — METOPROLOL TARTRATE 25 MG PO TABS: 25.0000 mg | ORAL_TABLET | Freq: Two times a day (BID) | ORAL | 12 refills | Status: DC

## 2017-02-16 MED ORDER — AZITHROMYCIN 250 MG PO TABS: ORAL_TABLET | ORAL | 0 refills | Status: DC

## 2017-02-16 MED ORDER — KETOCONAZOLE 2 % EX CREA: 1.0000 "application " | TOPICAL_CREAM | Freq: Two times a day (BID) | CUTANEOUS | 12 refills | Status: DC

## 2017-02-16 NOTE — Progress Notes (Signed)
 Peter [name]  MRN: [id number] DOB: 12/14/1960  Subjective:  HPI  Patient is here for hospital follow up and nursing home follow up. Several dates of admission and procedures that were done are listed below: 10/30/16-11/23/16 DX: Ventral hernia with gangrene after initial illness of appendicitis. Since then up to 01/10/17 he has had : appendectomy, explore laparotomy, application wound vac, dressing change under anesthesia, tracheostomy, EGD and Colonoscopy. Patient was at [facility] from 01/14/17-02/06/17. He is at home now and is having physical and occupational therapy at home and nursing. Symptoms of concern today are cough, throat feels swollen-for example has hard time swallowing potassium tablet, and nausea.  Wt Readings from Last 3 Encounters:  02/16/17 267 lb (121.1 kg)  11/23/16 (!) 355 lb (161 kg)  10/30/16 (!) 310 lb (140.6 kg)   BP Readings from Last 3 Encounters:  02/16/17 122/74  11/23/16 (!) 157/95  11/07/16 (!) 152/82    Patient Active Problem List   Diagnosis Date Noted  . Open abdominal incision with drainage   . 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 Necrotizing soft tissue infection 11/11/2016  . Appendicitis, acute 10/30/2016  . Acute appendicitis with generalized peritonitis   . Hyperaldosteronism (HCC) 09/21/2016  . Class 3 obesity with body mass index (BMI) of 50.0 to 59.9 in adult (HCC) 09/21/2016  . Decreased libido 09/21/2016  . Shortness of breath 06/15/2016  . Abnormal stress test 06/15/2016  . 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 Avitaminosis D 03/25/2015  . Blood in stool   . Clinical depression 06/21/2011  . Cannot sleep 08/22/2009  . Hypercholesterolemia without hypertriglyceridemia 01/28/2009  . Hay fever 10/22/2008  . Adjustment disorder with mixed anxiety and depressed mood 12/12/2007  . Essential (primary) hypertension 12/12/2007  . Apnea, sleep 12/12/2007    Past Medical History:  Diagnosis Date  . Blood in stool   . Difficult  intubation   . Glaucoma    since 2004  . Hypertension    since 2000  . Obesity, unspecified   . Ulcer 2011   gastric  . Unspecified hemorrhoids without mention of complication     Social History   Social History  . Marital status: Married    Spouse name: N/A  . Number of children: N/A  . Years of education: N/A   Occupational History  . Not on file.   Social History Main Topics  . Smoking status: Never Smoker  . Smokeless tobacco: Never Used  . Alcohol use Yes     Comment: occasionally  . Drug use: No  . Sexual activity: Not on file   Other Topics Concern  . Not on file   Social History Narrative  . No narrative on file    Outpatient Encounter Prescriptions as of 02/16/2017  Medication Sig  . Amino Acids-Protein Hydrolys (FEEDING SUPPLEMENT, PRO-STAT SUGAR FREE 64,) LIQD Take 30 mLs by mouth 3 (three) times daily with meals.  . aspirin EC 81 MG tablet Take 81 mg by mouth daily.  . atorvastatin (LIPITOR) 40 MG tablet Take 1 tablet (40 mg total) by mouth daily. (Patient taking differently: Take 40 mg by mouth every evening. )  . clonazePAM (KLONOPIN) 0.5 MG tablet Take by mouth.  . EQUETRO 200 MG CP12 12 hr capsule Take 400-600 mg by mouth 2 (two) times daily. Take 400 mg in the morning & 600 mg in the evening.  . ibuprofen (ADVIL,MOTRIN) 200 MG tablet Take 800 mg by mouth 3 (three) times daily as needed for headache   or moderate pain.  . ketoconazole (NIZORAL) 2 % cream Apply 1 application topically 2 (two) times daily.  . lamoTRIgine (LAMICTAL) 200 MG tablet Take 400 mg by mouth at bedtime.   . latanoprost (XALATAN) 0.005 % ophthalmic solution Place 1 drop into both eyes at bedtime.  . MAGNESIUM-OXIDE 400 (241.3 Mg) MG tablet Take 400 mg by mouth 2 (two) times daily.  . Melatonin 3 MG TABS Take by mouth at bedtime.  . metoprolol tartrate (LOPRESSOR) 25 MG tablet Take 25 mg by mouth 2 (two) times daily.  . Multiple Vitamin (MULTIVITAMIN) tablet Take 1 tablet by mouth  daily.  . potassium chloride SA (K-DUR,KLOR-CON) 20 MEQ tablet Take 2 tablets (40 mEq total) by mouth 3 (three) times daily.  . ranitidine (ZANTAC) 300 MG tablet TAKE 1 TABLET(300 MG) BY MOUTH TWICE DAILY  . risperiDONE (RISPERDAL) 2 MG tablet Take 2 mg by mouth at bedtime.  . Sennosides-Docusate Sodium (SENEXON-S PO) Take by mouth 2 (two) times daily as needed.  . sertraline (ZOLOFT) 50 MG tablet Take 50 mg by mouth daily.  . spironolactone (ALDACTONE) 25 MG tablet Take 25 mg by mouth daily.  . sucralfate (CARAFATE) 1 GM/10ML suspension Take 1 g by mouth 4 (four) times daily.  . thiamine (VITAMIN B-1) 100 MG tablet Take 100 mg by mouth daily.  . Vitamin D, Ergocalciferol, (DRISDOL) 50000 units CAPS capsule Take 1 capsule (50,000 Units total) by mouth every 7 (seven) days.  . [DISCONTINUED] sertraline (ZOLOFT) 100 MG tablet TAKE 1 TABLET BY MOUTH TWICE DAILY (Patient taking differently: TAKE 1/2 TABLETS (200 MG) BY MOUTH DAILY)  . losartan (COZAAR) 100 MG tablet TAKE 1 TABLET BY MOUTH EVERY DAY (Patient not taking: Reported on 02/16/2017)  . [DISCONTINUED] amLODipine (NORVASC) 10 MG tablet TAKE 1 TABLET BY MOUTH EVERY DAY (Patient not taking: Reported on 11/11/2016)  . [DISCONTINUED] chlorhexidine gluconate, MEDLINE KIT, (PERIDEX) 0.12 % solution 15 mLs by Mouth Rinse route 2 (two) times daily.  . [DISCONTINUED] clobetasol cream (TEMOVATE) 0.05 % Apply 1 application topically at bedtime as needed.  . [DISCONTINUED] famotidine (PEPCID) 20 MG tablet Place 1 tablet (20 mg total) into feeding tube 2 (two) times daily.  . [DISCONTINUED] fentaNYL (SUBLIMAZE) SOLN Inject 50 mcg into the vein every hour as needed.  . [DISCONTINUED] fentaNYL 10 mcg/ml SOLN infusion Inject 25-400 mcg/hr into the vein continuous.  . [DISCONTINUED] fluticasone (FLONASE) 50 MCG/ACT nasal spray Place 2 sprays into both nostrils daily. (Patient taking differently: Place 2 sprays into both nostrils daily as needed for allergies. )    . [DISCONTINUED] Garlic 100 MG TABS Take 100 mg by mouth 2 (two) times daily.   . [DISCONTINUED] hydrALAZINE (APRESOLINE) 25 MG tablet TAKE 1 TABLET(25 MG) BY MOUTH THREE TIMES DAILY  . [DISCONTINUED] insulin aspart (NOVOLOG) 100 UNIT/ML injection Inject 0-15 Units into the skin every 4 (four) hours.  . [DISCONTINUED] ipratropium-albuterol (DUONEB) 0.5-2.5 (3) MG/3ML SOLN Take 3 mLs by nebulization every 6 (six) hours.  . [DISCONTINUED] lactated ringers infusion Inject 20 mLs into the vein continuous.  . [DISCONTINUED] lip balm (BLISTEX) OINT Apply 1 application topically as needed for lip care.  . [DISCONTINUED] LUMIGAN 0.01 % SOLN Place 1 drop into both eyes at bedtime.   . [DISCONTINUED] midazolam (VERSED) 2 MG/2ML SOLN injection Inject 2-4 mLs (2-4 mg total) into the vein every 2 (two) hours as needed for agitation (to maintain RASS goal.).  . [DISCONTINUED] mouth rinse LIQD solution 15 mLs by Mouth Rinse route every 2 (  two) hours as needed.  . [DISCONTINUED] Nebivolol HCl 20 MG TABS Take 1 tablet (20 mg total) by mouth every evening.  . [DISCONTINUED] Nutritional Supplements (FEEDING SUPPLEMENT, VITAL HIGH PROTEIN,) LIQD liquid Place 1,000 mLs into feeding tube continuous.  . [DISCONTINUED] OMEGA-3 FATTY ACIDS PO Take 1 capsule by mouth 2 (two) times daily.   . [DISCONTINUED] ondansetron (ZOFRAN) 4 MG/2ML SOLN injection Inject 2 mLs (4 mg total) into the vein every 6 (six) hours as needed for nausea.  . [DISCONTINUED] oxyCODONE (OXY IR/ROXICODONE) 5 MG immediate release tablet Take 1-2 tablets (5-10 mg total) by mouth every 4 (four) hours as needed for severe pain.  . [DISCONTINUED] piperacillin-tazobactam 4.5 g in dextrose 5 % 100 mL Inject 4.5 g into the vein every 8 (eight) hours.  . [DISCONTINUED] potassium chloride 20 MEQ/15ML (10%) SOLN Place 30 mLs (40 mEq total) into feeding tube 2 (two) times daily.  . [DISCONTINUED] RUTIN, MISC. NUTRITIONAL FACTORS, TAKE 1 TABLET (500 MG) BY MOUTH  DAILY  . [DISCONTINUED] sodium chloride flush (NS) 0.9 % SOLN 10-40 mLs by Intracatheter route every 12 (twelve) hours.  . [DISCONTINUED] sodium chloride flush (NS) 0.9 % SOLN 10-40 mLs by Intracatheter route as needed (flush).  . [DISCONTINUED] vitamin C (ASCORBIC ACID) 500 MG tablet Take 500 mg by mouth every evening.   . [DISCONTINUED] Water For Irrigation, Sterile (FREE WATER) SOLN Place 200 mLs into feeding tube every 8 (eight) hours.   No facility-administered encounter medications on file as of 02/16/2017.     Allergies  Allergen Reactions  . Isosorbide     Headache, nausea, vomiting    Review of Systems  Constitutional: Positive for malaise/fatigue.       Sweating  Eyes: Negative.   Respiratory: Positive for cough and shortness of breath. Negative for sputum production and wheezing.   Cardiovascular: Negative.   Gastrointestinal: Positive for nausea.  Musculoskeletal: Negative.   Skin: Negative.   Neurological: Positive for weakness.  Endo/Heme/Allergies: Negative.   Psychiatric/Behavioral: Positive for depression.    Objective:  BP 122/74   Pulse (!) 106   Temp 99.4 F (37.4 C)   Resp 18   Wt 267 lb (121.1 kg) Comment: per patient as of 02/15/17  SpO2 97%   BMI 36.21 kg/m   Physical Exam  Constitutional: He is oriented to person, place, and time and well-developed, well-nourished, and in no distress.  Obese WM in wheelchair.   HENT:  Head: Normocephalic and atraumatic.  Nose: Nose normal.  Eyes: Conjunctivae are normal. No scleral icterus.  Neck: No thyromegaly present.  Cardiovascular: Normal rate, regular rhythm and normal heart sounds.   Pulmonary/Chest: Effort normal and breath sounds normal.  Abdominal: Soft.  Neurological: He is alert and oriented to person, place, and time.  Skin: Skin is warm and dry.  Psychiatric: Memory, affect and judgment normal.  Depressed mood but not suicidal.    Assessment and Plan :  1. Hospital discharge  follow-up Patient had ruptured appendix followed by multiple surgeries for abdominal wall infection He was in hospitals or a facility from April 24 until last week. Medications reconciled. Records reviewed. More than 1 hour spent on this visit. At least 50% in counseling. Follow up in 1 month. 2. Encounter for long-term (current) use of medications - Carbamazepine level, total  3. Class 3 obesity with body mass index (BMI) of 50.0 to 59.9 in adult, unspecified obesity type, unspecified whether serious comorbidity present (HCC)  4. S/P appendectomy  5. Nausea Try Zofran.   Check lab work. Follow up in 1 month. - ondansetron (ZOFRAN-ODT) 8 MG disintegrating tablet; Take 1 tablet (8 mg total) by mouth every 8 (eight) hours as needed for nausea or vomiting.  Dispense: 90 tablet; Refill: 0 - Comprehensive metabolic panel - TSH  6. Bronchitis Treat with Zpak. Follow. - CBC with Differential/Platelet - Comprehensive metabolic panel  - azithromycin (ZITHROMAX) 250 MG tablet; As directed  Dispense: 6 tablet; Refill: 0' 7. Depression He is depressed today but not suicidal. I think he might benefit in the future for counseling. May need to adjust medications on his next visit. HPI, Exam and A&P transcribed by Anastasiya Aleksandrova, RMA under direction and in the presence of  , MD. I have done the exam and reviewed the chart and it is accurate to the best of my knowledge. Dragon  technology has been used and  any errors in dictation or transcription are unintentional.   M.D. [facility]  

## 2017-02-17 DIAGNOSIS — Z9181 History of falling: Secondary | ICD-10-CM | POA: Diagnosis not present

## 2017-02-17 DIAGNOSIS — F3110 Bipolar disorder, current episode manic without psychotic features, unspecified: Secondary | ICD-10-CM | POA: Diagnosis not present

## 2017-02-17 DIAGNOSIS — K5900 Constipation, unspecified: Secondary | ICD-10-CM | POA: Diagnosis not present

## 2017-02-17 DIAGNOSIS — I2510 Atherosclerotic heart disease of native coronary artery without angina pectoris: Secondary | ICD-10-CM | POA: Diagnosis not present

## 2017-02-17 DIAGNOSIS — F064 Anxiety disorder due to known physiological condition: Secondary | ICD-10-CM | POA: Diagnosis not present

## 2017-02-17 DIAGNOSIS — T814XXD Infection following a procedure, subsequent encounter: Secondary | ICD-10-CM | POA: Diagnosis not present

## 2017-02-17 DIAGNOSIS — E785 Hyperlipidemia, unspecified: Secondary | ICD-10-CM | POA: Diagnosis not present

## 2017-02-17 DIAGNOSIS — I10 Essential (primary) hypertension: Secondary | ICD-10-CM | POA: Diagnosis not present

## 2017-02-17 DIAGNOSIS — K219 Gastro-esophageal reflux disease without esophagitis: Secondary | ICD-10-CM | POA: Diagnosis not present

## 2017-02-17 DIAGNOSIS — H401130 Primary open-angle glaucoma, bilateral, stage unspecified: Secondary | ICD-10-CM | POA: Diagnosis not present

## 2017-02-17 DIAGNOSIS — Z434 Encounter for attention to other artificial openings of digestive tract: Secondary | ICD-10-CM | POA: Diagnosis not present

## 2017-02-18 DIAGNOSIS — Z9181 History of falling: Secondary | ICD-10-CM | POA: Diagnosis not present

## 2017-02-18 DIAGNOSIS — I2510 Atherosclerotic heart disease of native coronary artery without angina pectoris: Secondary | ICD-10-CM | POA: Diagnosis not present

## 2017-02-18 DIAGNOSIS — E785 Hyperlipidemia, unspecified: Secondary | ICD-10-CM | POA: Diagnosis not present

## 2017-02-18 DIAGNOSIS — K219 Gastro-esophageal reflux disease without esophagitis: Secondary | ICD-10-CM | POA: Diagnosis not present

## 2017-02-18 DIAGNOSIS — H401130 Primary open-angle glaucoma, bilateral, stage unspecified: Secondary | ICD-10-CM | POA: Diagnosis not present

## 2017-02-18 DIAGNOSIS — K5900 Constipation, unspecified: Secondary | ICD-10-CM | POA: Diagnosis not present

## 2017-02-18 DIAGNOSIS — Z434 Encounter for attention to other artificial openings of digestive tract: Secondary | ICD-10-CM | POA: Diagnosis not present

## 2017-02-18 DIAGNOSIS — F3110 Bipolar disorder, current episode manic without psychotic features, unspecified: Secondary | ICD-10-CM | POA: Diagnosis not present

## 2017-02-18 DIAGNOSIS — T814XXD Infection following a procedure, subsequent encounter: Secondary | ICD-10-CM | POA: Diagnosis not present

## 2017-02-18 DIAGNOSIS — F064 Anxiety disorder due to known physiological condition: Secondary | ICD-10-CM | POA: Diagnosis not present

## 2017-02-18 DIAGNOSIS — I10 Essential (primary) hypertension: Secondary | ICD-10-CM | POA: Diagnosis not present

## 2017-02-18 LAB — COMPREHENSIVE METABOLIC PANEL
ALT: 9 IU/L (ref 0–44)
AST: 14 IU/L (ref 0–40)
Albumin/Globulin Ratio: 0.8 — ABNORMAL LOW (ref 1.2–2.2)
Albumin: 2.8 g/dL — ABNORMAL LOW (ref 3.5–5.5)
Alkaline Phosphatase: 191 IU/L — ABNORMAL HIGH (ref 39–117)
BUN/Creatinine Ratio: 8 — ABNORMAL LOW (ref 9–20)
BUN: 11 mg/dL (ref 6–24)
CALCIUM: 8.6 mg/dL — AB (ref 8.7–10.2)
CHLORIDE: 104 mmol/L (ref 96–106)
CO2: 20 mmol/L (ref 20–29)
Creatinine, Ser: 1.3 mg/dL — ABNORMAL HIGH (ref 0.76–1.27)
GFR, EST AFRICAN AMERICAN: 70 mL/min/{1.73_m2} (ref >59)
GFR, EST NON AFRICAN AMERICAN: 60 mL/min/{1.73_m2} (ref >59)
GLUCOSE: 97 mg/dL (ref 65–99)
Globulin, Total: 3.5 g/dL (ref 1.5–4.5)
Potassium: 4 mmol/L (ref 3.5–5.2)
Sodium: 139 mmol/L (ref 134–144)
TOTAL PROTEIN: 6.3 g/dL (ref 6.0–8.5)

## 2017-02-18 LAB — CBC WITH DIFFERENTIAL/PLATELET
BASOS ABS: 0 10*3/uL (ref 0.0–0.2)
Basos: 0 %
EOS (ABSOLUTE): 0.2 10*3/uL (ref 0.0–0.4)
EOS: 2 %
HEMOGLOBIN: 10 g/dL — AB (ref 13.0–17.7)
Hematocrit: 31.2 % — ABNORMAL LOW (ref 37.5–51.0)
IMMATURE GRANS (ABS): 0 10*3/uL (ref 0.0–0.1)
IMMATURE GRANULOCYTES: 0 %
LYMPHS ABS: 1.7 10*3/uL (ref 0.7–3.1)
Lymphs: 21 %
MCH: 27.2 pg (ref 26.6–33.0)
MCHC: 32.1 g/dL (ref 31.5–35.7)
MCV: 85 fL (ref 79–97)
MONOCYTES: 9 %
Monocytes Absolute: 0.8 10*3/uL (ref 0.1–0.9)
NEUTROS ABS: 5.4 10*3/uL (ref 1.4–7.0)
Neutrophils: 68 %
PLATELETS: 335 10*3/uL (ref 150–379)
RBC: 3.67 x10E6/uL — AB (ref 4.14–5.80)
RDW: 15.4 % (ref 12.3–15.4)
WBC: 8 10*3/uL (ref 3.4–10.8)

## 2017-02-18 LAB — TSH: TSH: 1.81 u[IU]/mL (ref 0.450–4.500)

## 2017-02-18 LAB — CARBAMAZEPINE LEVEL, TOTAL: Carbamazepine (Tegretol), S: 10.7 ug/mL (ref 4.0–12.0)

## 2017-02-21 DIAGNOSIS — F064 Anxiety disorder due to known physiological condition: Secondary | ICD-10-CM | POA: Diagnosis not present

## 2017-02-21 DIAGNOSIS — Z9181 History of falling: Secondary | ICD-10-CM | POA: Diagnosis not present

## 2017-02-21 DIAGNOSIS — Z434 Encounter for attention to other artificial openings of digestive tract: Secondary | ICD-10-CM | POA: Diagnosis not present

## 2017-02-21 DIAGNOSIS — T814XXD Infection following a procedure, subsequent encounter: Secondary | ICD-10-CM | POA: Diagnosis not present

## 2017-02-21 DIAGNOSIS — F3110 Bipolar disorder, current episode manic without psychotic features, unspecified: Secondary | ICD-10-CM | POA: Diagnosis not present

## 2017-02-21 DIAGNOSIS — H401130 Primary open-angle glaucoma, bilateral, stage unspecified: Secondary | ICD-10-CM | POA: Diagnosis not present

## 2017-02-21 DIAGNOSIS — I2510 Atherosclerotic heart disease of native coronary artery without angina pectoris: Secondary | ICD-10-CM | POA: Diagnosis not present

## 2017-02-21 DIAGNOSIS — K5900 Constipation, unspecified: Secondary | ICD-10-CM | POA: Diagnosis not present

## 2017-02-21 DIAGNOSIS — K219 Gastro-esophageal reflux disease without esophagitis: Secondary | ICD-10-CM | POA: Diagnosis not present

## 2017-02-21 DIAGNOSIS — E785 Hyperlipidemia, unspecified: Secondary | ICD-10-CM | POA: Diagnosis not present

## 2017-02-21 DIAGNOSIS — I10 Essential (primary) hypertension: Secondary | ICD-10-CM | POA: Diagnosis not present

## 2017-02-22 DIAGNOSIS — F064 Anxiety disorder due to known physiological condition: Secondary | ICD-10-CM | POA: Diagnosis not present

## 2017-02-22 DIAGNOSIS — H401130 Primary open-angle glaucoma, bilateral, stage unspecified: Secondary | ICD-10-CM | POA: Diagnosis not present

## 2017-02-22 DIAGNOSIS — E785 Hyperlipidemia, unspecified: Secondary | ICD-10-CM | POA: Diagnosis not present

## 2017-02-22 DIAGNOSIS — K5900 Constipation, unspecified: Secondary | ICD-10-CM | POA: Diagnosis not present

## 2017-02-22 DIAGNOSIS — Z9181 History of falling: Secondary | ICD-10-CM | POA: Diagnosis not present

## 2017-02-22 DIAGNOSIS — Z434 Encounter for attention to other artificial openings of digestive tract: Secondary | ICD-10-CM | POA: Diagnosis not present

## 2017-02-22 DIAGNOSIS — F3110 Bipolar disorder, current episode manic without psychotic features, unspecified: Secondary | ICD-10-CM | POA: Diagnosis not present

## 2017-02-22 DIAGNOSIS — K219 Gastro-esophageal reflux disease without esophagitis: Secondary | ICD-10-CM | POA: Diagnosis not present

## 2017-02-22 DIAGNOSIS — I10 Essential (primary) hypertension: Secondary | ICD-10-CM | POA: Diagnosis not present

## 2017-02-22 DIAGNOSIS — I2510 Atherosclerotic heart disease of native coronary artery without angina pectoris: Secondary | ICD-10-CM | POA: Diagnosis not present

## 2017-02-22 DIAGNOSIS — T814XXD Infection following a procedure, subsequent encounter: Secondary | ICD-10-CM | POA: Diagnosis not present

## 2017-02-23 DIAGNOSIS — H401130 Primary open-angle glaucoma, bilateral, stage unspecified: Secondary | ICD-10-CM | POA: Diagnosis not present

## 2017-02-23 DIAGNOSIS — T814XXD Infection following a procedure, subsequent encounter: Secondary | ICD-10-CM | POA: Diagnosis not present

## 2017-02-23 DIAGNOSIS — F064 Anxiety disorder due to known physiological condition: Secondary | ICD-10-CM | POA: Diagnosis not present

## 2017-02-23 DIAGNOSIS — K219 Gastro-esophageal reflux disease without esophagitis: Secondary | ICD-10-CM | POA: Diagnosis not present

## 2017-02-23 DIAGNOSIS — Z434 Encounter for attention to other artificial openings of digestive tract: Secondary | ICD-10-CM | POA: Diagnosis not present

## 2017-02-23 DIAGNOSIS — I2510 Atherosclerotic heart disease of native coronary artery without angina pectoris: Secondary | ICD-10-CM | POA: Diagnosis not present

## 2017-02-23 DIAGNOSIS — K5900 Constipation, unspecified: Secondary | ICD-10-CM | POA: Diagnosis not present

## 2017-02-23 DIAGNOSIS — Z9181 History of falling: Secondary | ICD-10-CM | POA: Diagnosis not present

## 2017-02-23 DIAGNOSIS — I10 Essential (primary) hypertension: Secondary | ICD-10-CM | POA: Diagnosis not present

## 2017-02-23 DIAGNOSIS — E785 Hyperlipidemia, unspecified: Secondary | ICD-10-CM | POA: Diagnosis not present

## 2017-02-23 DIAGNOSIS — F3110 Bipolar disorder, current episode manic without psychotic features, unspecified: Secondary | ICD-10-CM | POA: Diagnosis not present

## 2017-02-24 DIAGNOSIS — K5900 Constipation, unspecified: Secondary | ICD-10-CM | POA: Diagnosis not present

## 2017-02-24 DIAGNOSIS — F064 Anxiety disorder due to known physiological condition: Secondary | ICD-10-CM | POA: Diagnosis not present

## 2017-02-24 DIAGNOSIS — T814XXD Infection following a procedure, subsequent encounter: Secondary | ICD-10-CM | POA: Diagnosis not present

## 2017-02-24 DIAGNOSIS — F3110 Bipolar disorder, current episode manic without psychotic features, unspecified: Secondary | ICD-10-CM | POA: Diagnosis not present

## 2017-02-24 DIAGNOSIS — K219 Gastro-esophageal reflux disease without esophagitis: Secondary | ICD-10-CM | POA: Diagnosis not present

## 2017-02-24 DIAGNOSIS — I2510 Atherosclerotic heart disease of native coronary artery without angina pectoris: Secondary | ICD-10-CM | POA: Diagnosis not present

## 2017-02-24 DIAGNOSIS — H401130 Primary open-angle glaucoma, bilateral, stage unspecified: Secondary | ICD-10-CM | POA: Diagnosis not present

## 2017-02-24 DIAGNOSIS — I10 Essential (primary) hypertension: Secondary | ICD-10-CM | POA: Diagnosis not present

## 2017-02-24 DIAGNOSIS — F3175 Bipolar disorder, in partial remission, most recent episode depressed: Secondary | ICD-10-CM | POA: Diagnosis not present

## 2017-02-24 DIAGNOSIS — E785 Hyperlipidemia, unspecified: Secondary | ICD-10-CM | POA: Diagnosis not present

## 2017-02-24 DIAGNOSIS — Z434 Encounter for attention to other artificial openings of digestive tract: Secondary | ICD-10-CM | POA: Diagnosis not present

## 2017-02-24 DIAGNOSIS — Z9181 History of falling: Secondary | ICD-10-CM | POA: Diagnosis not present

## 2017-02-25 DIAGNOSIS — Z434 Encounter for attention to other artificial openings of digestive tract: Secondary | ICD-10-CM | POA: Diagnosis not present

## 2017-02-25 DIAGNOSIS — T814XXD Infection following a procedure, subsequent encounter: Secondary | ICD-10-CM | POA: Diagnosis not present

## 2017-02-25 DIAGNOSIS — F064 Anxiety disorder due to known physiological condition: Secondary | ICD-10-CM | POA: Diagnosis not present

## 2017-02-25 DIAGNOSIS — K219 Gastro-esophageal reflux disease without esophagitis: Secondary | ICD-10-CM | POA: Diagnosis not present

## 2017-02-25 DIAGNOSIS — Z9181 History of falling: Secondary | ICD-10-CM | POA: Diagnosis not present

## 2017-02-25 DIAGNOSIS — I10 Essential (primary) hypertension: Secondary | ICD-10-CM | POA: Diagnosis not present

## 2017-02-25 DIAGNOSIS — H401130 Primary open-angle glaucoma, bilateral, stage unspecified: Secondary | ICD-10-CM | POA: Diagnosis not present

## 2017-02-25 DIAGNOSIS — F3110 Bipolar disorder, current episode manic without psychotic features, unspecified: Secondary | ICD-10-CM | POA: Diagnosis not present

## 2017-02-25 DIAGNOSIS — E785 Hyperlipidemia, unspecified: Secondary | ICD-10-CM | POA: Diagnosis not present

## 2017-02-25 DIAGNOSIS — K5900 Constipation, unspecified: Secondary | ICD-10-CM | POA: Diagnosis not present

## 2017-02-25 DIAGNOSIS — I2510 Atherosclerotic heart disease of native coronary artery without angina pectoris: Secondary | ICD-10-CM | POA: Diagnosis not present

## 2017-02-28 DIAGNOSIS — T814XXD Infection following a procedure, subsequent encounter: Secondary | ICD-10-CM | POA: Diagnosis not present

## 2017-02-28 DIAGNOSIS — Z434 Encounter for attention to other artificial openings of digestive tract: Secondary | ICD-10-CM | POA: Diagnosis not present

## 2017-02-28 DIAGNOSIS — H401130 Primary open-angle glaucoma, bilateral, stage unspecified: Secondary | ICD-10-CM | POA: Diagnosis not present

## 2017-02-28 DIAGNOSIS — Z9181 History of falling: Secondary | ICD-10-CM | POA: Diagnosis not present

## 2017-02-28 DIAGNOSIS — I2510 Atherosclerotic heart disease of native coronary artery without angina pectoris: Secondary | ICD-10-CM | POA: Diagnosis not present

## 2017-02-28 DIAGNOSIS — I10 Essential (primary) hypertension: Secondary | ICD-10-CM | POA: Diagnosis not present

## 2017-02-28 DIAGNOSIS — K5900 Constipation, unspecified: Secondary | ICD-10-CM | POA: Diagnosis not present

## 2017-02-28 DIAGNOSIS — F3110 Bipolar disorder, current episode manic without psychotic features, unspecified: Secondary | ICD-10-CM | POA: Diagnosis not present

## 2017-02-28 DIAGNOSIS — F064 Anxiety disorder due to known physiological condition: Secondary | ICD-10-CM | POA: Diagnosis not present

## 2017-02-28 DIAGNOSIS — E785 Hyperlipidemia, unspecified: Secondary | ICD-10-CM | POA: Diagnosis not present

## 2017-02-28 DIAGNOSIS — K219 Gastro-esophageal reflux disease without esophagitis: Secondary | ICD-10-CM | POA: Diagnosis not present

## 2017-03-01 DIAGNOSIS — E785 Hyperlipidemia, unspecified: Secondary | ICD-10-CM | POA: Diagnosis not present

## 2017-03-01 DIAGNOSIS — I2510 Atherosclerotic heart disease of native coronary artery without angina pectoris: Secondary | ICD-10-CM | POA: Diagnosis not present

## 2017-03-01 DIAGNOSIS — F064 Anxiety disorder due to known physiological condition: Secondary | ICD-10-CM | POA: Diagnosis not present

## 2017-03-01 DIAGNOSIS — F3110 Bipolar disorder, current episode manic without psychotic features, unspecified: Secondary | ICD-10-CM | POA: Diagnosis not present

## 2017-03-01 DIAGNOSIS — K5900 Constipation, unspecified: Secondary | ICD-10-CM | POA: Diagnosis not present

## 2017-03-01 DIAGNOSIS — Z9181 History of falling: Secondary | ICD-10-CM | POA: Diagnosis not present

## 2017-03-01 DIAGNOSIS — H401130 Primary open-angle glaucoma, bilateral, stage unspecified: Secondary | ICD-10-CM | POA: Diagnosis not present

## 2017-03-01 DIAGNOSIS — T814XXD Infection following a procedure, subsequent encounter: Secondary | ICD-10-CM | POA: Diagnosis not present

## 2017-03-01 DIAGNOSIS — K219 Gastro-esophageal reflux disease without esophagitis: Secondary | ICD-10-CM | POA: Diagnosis not present

## 2017-03-01 DIAGNOSIS — Z434 Encounter for attention to other artificial openings of digestive tract: Secondary | ICD-10-CM | POA: Diagnosis not present

## 2017-03-01 DIAGNOSIS — I10 Essential (primary) hypertension: Secondary | ICD-10-CM | POA: Diagnosis not present

## 2017-03-02 DIAGNOSIS — K219 Gastro-esophageal reflux disease without esophagitis: Secondary | ICD-10-CM | POA: Diagnosis not present

## 2017-03-02 DIAGNOSIS — I10 Essential (primary) hypertension: Secondary | ICD-10-CM | POA: Diagnosis not present

## 2017-03-02 DIAGNOSIS — I2510 Atherosclerotic heart disease of native coronary artery without angina pectoris: Secondary | ICD-10-CM | POA: Diagnosis not present

## 2017-03-02 DIAGNOSIS — K5900 Constipation, unspecified: Secondary | ICD-10-CM | POA: Diagnosis not present

## 2017-03-02 DIAGNOSIS — T814XXD Infection following a procedure, subsequent encounter: Secondary | ICD-10-CM | POA: Diagnosis not present

## 2017-03-02 DIAGNOSIS — F064 Anxiety disorder due to known physiological condition: Secondary | ICD-10-CM | POA: Diagnosis not present

## 2017-03-02 DIAGNOSIS — F3110 Bipolar disorder, current episode manic without psychotic features, unspecified: Secondary | ICD-10-CM | POA: Diagnosis not present

## 2017-03-02 DIAGNOSIS — E785 Hyperlipidemia, unspecified: Secondary | ICD-10-CM | POA: Diagnosis not present

## 2017-03-02 DIAGNOSIS — H401130 Primary open-angle glaucoma, bilateral, stage unspecified: Secondary | ICD-10-CM | POA: Diagnosis not present

## 2017-03-02 DIAGNOSIS — Z434 Encounter for attention to other artificial openings of digestive tract: Secondary | ICD-10-CM | POA: Diagnosis not present

## 2017-03-02 DIAGNOSIS — Z9181 History of falling: Secondary | ICD-10-CM | POA: Diagnosis not present

## 2017-03-03 ENCOUNTER — Telehealth: Payer: Self-pay | Admitting: Family Medicine

## 2017-03-03 NOTE — Telephone Encounter (Signed)
Verbal for OT for 2 times a week for 4 weeks  Call back is [phone]  Thanks teri

## 2017-03-03 NOTE — Telephone Encounter (Signed)
[email or web address] 

## 2017-03-03 NOTE — Telephone Encounter (Signed)
ok 

## 2017-03-03 NOTE — Telephone Encounter (Signed)
Please review-aa 

## 2017-03-04 DIAGNOSIS — K5900 Constipation, unspecified: Secondary | ICD-10-CM | POA: Diagnosis not present

## 2017-03-04 DIAGNOSIS — I10 Essential (primary) hypertension: Secondary | ICD-10-CM | POA: Diagnosis not present

## 2017-03-04 DIAGNOSIS — T814XXD Infection following a procedure, subsequent encounter: Secondary | ICD-10-CM | POA: Diagnosis not present

## 2017-03-04 DIAGNOSIS — F064 Anxiety disorder due to known physiological condition: Secondary | ICD-10-CM | POA: Diagnosis not present

## 2017-03-04 DIAGNOSIS — K219 Gastro-esophageal reflux disease without esophagitis: Secondary | ICD-10-CM | POA: Diagnosis not present

## 2017-03-04 DIAGNOSIS — Z9181 History of falling: Secondary | ICD-10-CM | POA: Diagnosis not present

## 2017-03-04 DIAGNOSIS — E785 Hyperlipidemia, unspecified: Secondary | ICD-10-CM | POA: Diagnosis not present

## 2017-03-04 DIAGNOSIS — H401130 Primary open-angle glaucoma, bilateral, stage unspecified: Secondary | ICD-10-CM | POA: Diagnosis not present

## 2017-03-04 DIAGNOSIS — F3110 Bipolar disorder, current episode manic without psychotic features, unspecified: Secondary | ICD-10-CM | POA: Diagnosis not present

## 2017-03-04 DIAGNOSIS — Z434 Encounter for attention to other artificial openings of digestive tract: Secondary | ICD-10-CM | POA: Diagnosis not present

## 2017-03-04 DIAGNOSIS — I2510 Atherosclerotic heart disease of native coronary artery without angina pectoris: Secondary | ICD-10-CM | POA: Diagnosis not present

## 2017-03-04 NOTE — Telephone Encounter (Signed)
[name] advised.   Thanks,   -Doyl Bitting  

## 2017-03-08 DIAGNOSIS — K5900 Constipation, unspecified: Secondary | ICD-10-CM | POA: Diagnosis not present

## 2017-03-08 DIAGNOSIS — Z9181 History of falling: Secondary | ICD-10-CM | POA: Diagnosis not present

## 2017-03-08 DIAGNOSIS — K219 Gastro-esophageal reflux disease without esophagitis: Secondary | ICD-10-CM | POA: Diagnosis not present

## 2017-03-08 DIAGNOSIS — E785 Hyperlipidemia, unspecified: Secondary | ICD-10-CM | POA: Diagnosis not present

## 2017-03-08 DIAGNOSIS — F3110 Bipolar disorder, current episode manic without psychotic features, unspecified: Secondary | ICD-10-CM | POA: Diagnosis not present

## 2017-03-08 DIAGNOSIS — H401130 Primary open-angle glaucoma, bilateral, stage unspecified: Secondary | ICD-10-CM | POA: Diagnosis not present

## 2017-03-08 DIAGNOSIS — I2510 Atherosclerotic heart disease of native coronary artery without angina pectoris: Secondary | ICD-10-CM | POA: Diagnosis not present

## 2017-03-08 DIAGNOSIS — F064 Anxiety disorder due to known physiological condition: Secondary | ICD-10-CM | POA: Diagnosis not present

## 2017-03-08 DIAGNOSIS — I10 Essential (primary) hypertension: Secondary | ICD-10-CM | POA: Diagnosis not present

## 2017-03-08 DIAGNOSIS — Z434 Encounter for attention to other artificial openings of digestive tract: Secondary | ICD-10-CM | POA: Diagnosis not present

## 2017-03-08 DIAGNOSIS — T814XXD Infection following a procedure, subsequent encounter: Secondary | ICD-10-CM | POA: Diagnosis not present

## 2017-03-10 DIAGNOSIS — E785 Hyperlipidemia, unspecified: Secondary | ICD-10-CM | POA: Diagnosis not present

## 2017-03-10 DIAGNOSIS — H401130 Primary open-angle glaucoma, bilateral, stage unspecified: Secondary | ICD-10-CM | POA: Diagnosis not present

## 2017-03-10 DIAGNOSIS — Z434 Encounter for attention to other artificial openings of digestive tract: Secondary | ICD-10-CM | POA: Diagnosis not present

## 2017-03-10 DIAGNOSIS — I10 Essential (primary) hypertension: Secondary | ICD-10-CM | POA: Diagnosis not present

## 2017-03-10 DIAGNOSIS — K219 Gastro-esophageal reflux disease without esophagitis: Secondary | ICD-10-CM | POA: Diagnosis not present

## 2017-03-10 DIAGNOSIS — T814XXD Infection following a procedure, subsequent encounter: Secondary | ICD-10-CM | POA: Diagnosis not present

## 2017-03-10 DIAGNOSIS — F3110 Bipolar disorder, current episode manic without psychotic features, unspecified: Secondary | ICD-10-CM | POA: Diagnosis not present

## 2017-03-10 DIAGNOSIS — F064 Anxiety disorder due to known physiological condition: Secondary | ICD-10-CM | POA: Diagnosis not present

## 2017-03-10 DIAGNOSIS — Z9181 History of falling: Secondary | ICD-10-CM | POA: Diagnosis not present

## 2017-03-10 DIAGNOSIS — I2510 Atherosclerotic heart disease of native coronary artery without angina pectoris: Secondary | ICD-10-CM | POA: Diagnosis not present

## 2017-03-10 DIAGNOSIS — K5900 Constipation, unspecified: Secondary | ICD-10-CM | POA: Diagnosis not present

## 2017-03-15 DIAGNOSIS — E785 Hyperlipidemia, unspecified: Secondary | ICD-10-CM | POA: Diagnosis not present

## 2017-03-15 DIAGNOSIS — H401130 Primary open-angle glaucoma, bilateral, stage unspecified: Secondary | ICD-10-CM | POA: Diagnosis not present

## 2017-03-15 DIAGNOSIS — Z434 Encounter for attention to other artificial openings of digestive tract: Secondary | ICD-10-CM | POA: Diagnosis not present

## 2017-03-15 DIAGNOSIS — K219 Gastro-esophageal reflux disease without esophagitis: Secondary | ICD-10-CM | POA: Diagnosis not present

## 2017-03-15 DIAGNOSIS — F3110 Bipolar disorder, current episode manic without psychotic features, unspecified: Secondary | ICD-10-CM | POA: Diagnosis not present

## 2017-03-15 DIAGNOSIS — Z9181 History of falling: Secondary | ICD-10-CM | POA: Diagnosis not present

## 2017-03-15 DIAGNOSIS — K5900 Constipation, unspecified: Secondary | ICD-10-CM | POA: Diagnosis not present

## 2017-03-15 DIAGNOSIS — T814XXD Infection following a procedure, subsequent encounter: Secondary | ICD-10-CM | POA: Diagnosis not present

## 2017-03-15 DIAGNOSIS — F064 Anxiety disorder due to known physiological condition: Secondary | ICD-10-CM | POA: Diagnosis not present

## 2017-03-15 DIAGNOSIS — I2510 Atherosclerotic heart disease of native coronary artery without angina pectoris: Secondary | ICD-10-CM | POA: Diagnosis not present

## 2017-03-15 DIAGNOSIS — I10 Essential (primary) hypertension: Secondary | ICD-10-CM | POA: Diagnosis not present

## 2017-03-16 DIAGNOSIS — E785 Hyperlipidemia, unspecified: Secondary | ICD-10-CM | POA: Diagnosis not present

## 2017-03-16 DIAGNOSIS — Z434 Encounter for attention to other artificial openings of digestive tract: Secondary | ICD-10-CM | POA: Diagnosis not present

## 2017-03-16 DIAGNOSIS — F3110 Bipolar disorder, current episode manic without psychotic features, unspecified: Secondary | ICD-10-CM | POA: Diagnosis not present

## 2017-03-16 DIAGNOSIS — I2510 Atherosclerotic heart disease of native coronary artery without angina pectoris: Secondary | ICD-10-CM | POA: Diagnosis not present

## 2017-03-16 DIAGNOSIS — H401130 Primary open-angle glaucoma, bilateral, stage unspecified: Secondary | ICD-10-CM | POA: Diagnosis not present

## 2017-03-16 DIAGNOSIS — Z9181 History of falling: Secondary | ICD-10-CM | POA: Diagnosis not present

## 2017-03-16 DIAGNOSIS — F064 Anxiety disorder due to known physiological condition: Secondary | ICD-10-CM | POA: Diagnosis not present

## 2017-03-16 DIAGNOSIS — K5900 Constipation, unspecified: Secondary | ICD-10-CM | POA: Diagnosis not present

## 2017-03-16 DIAGNOSIS — K219 Gastro-esophageal reflux disease without esophagitis: Secondary | ICD-10-CM | POA: Diagnosis not present

## 2017-03-16 DIAGNOSIS — I10 Essential (primary) hypertension: Secondary | ICD-10-CM | POA: Diagnosis not present

## 2017-03-16 DIAGNOSIS — T814XXD Infection following a procedure, subsequent encounter: Secondary | ICD-10-CM | POA: Diagnosis not present

## 2017-03-17 ENCOUNTER — Telehealth: Payer: Self-pay | Admitting: Family Medicine

## 2017-03-17 DIAGNOSIS — H401130 Primary open-angle glaucoma, bilateral, stage unspecified: Secondary | ICD-10-CM | POA: Diagnosis not present

## 2017-03-17 DIAGNOSIS — K219 Gastro-esophageal reflux disease without esophagitis: Secondary | ICD-10-CM | POA: Diagnosis not present

## 2017-03-17 DIAGNOSIS — F064 Anxiety disorder due to known physiological condition: Secondary | ICD-10-CM | POA: Diagnosis not present

## 2017-03-17 DIAGNOSIS — I2510 Atherosclerotic heart disease of native coronary artery without angina pectoris: Secondary | ICD-10-CM | POA: Diagnosis not present

## 2017-03-17 DIAGNOSIS — I10 Essential (primary) hypertension: Secondary | ICD-10-CM | POA: Diagnosis not present

## 2017-03-17 DIAGNOSIS — Z9181 History of falling: Secondary | ICD-10-CM | POA: Diagnosis not present

## 2017-03-17 DIAGNOSIS — Z434 Encounter for attention to other artificial openings of digestive tract: Secondary | ICD-10-CM | POA: Diagnosis not present

## 2017-03-17 DIAGNOSIS — F3110 Bipolar disorder, current episode manic without psychotic features, unspecified: Secondary | ICD-10-CM | POA: Diagnosis not present

## 2017-03-17 DIAGNOSIS — T814XXD Infection following a procedure, subsequent encounter: Secondary | ICD-10-CM | POA: Diagnosis not present

## 2017-03-17 DIAGNOSIS — K5900 Constipation, unspecified: Secondary | ICD-10-CM | POA: Diagnosis not present

## 2017-03-17 DIAGNOSIS — E785 Hyperlipidemia, unspecified: Secondary | ICD-10-CM | POA: Diagnosis not present

## 2017-03-17 MED ORDER — AMLODIPINE BESYLATE 5 MG PO TABS: 5.0000 mg | ORAL_TABLET | Freq: Every day | ORAL | 12 refills | Status: DC

## 2017-03-17 NOTE — Telephone Encounter (Signed)
Peter [name] with [facility]  Returned Peter [name]'s call.

## 2017-03-17 NOTE — Telephone Encounter (Signed)
Please review-Anastasiya V Hopkins, RMA  

## 2017-03-17 NOTE — Telephone Encounter (Signed)
Add amlodipine 5 mg daily

## 2017-03-17 NOTE — Telephone Encounter (Signed)
RX for Amlodipine sent in. Colletta [place] advised on her voicemail as Occupational hygienist, RMA

## 2017-03-17 NOTE — Telephone Encounter (Signed)
lmtcb for [facility] with [facility]-Abdikadir Fohl Estell Harpin, RMA

## 2017-03-17 NOTE — Telephone Encounter (Signed)
lmtcb-Argus Caraher V Jeriyah Granlund, RMA  

## 2017-03-17 NOTE — Telephone Encounter (Signed)
[name] with [facility] called to report pt's blood pressure is up after he has taken his blood pressure med this am  160/100 at 9am 170/102 at 10am  No other symptoms  Just weak  Please advise [phone]  Thanks teri

## 2017-03-21 ENCOUNTER — Encounter: Payer: Self-pay | Admitting: Family Medicine

## 2017-03-21 ENCOUNTER — Ambulatory Visit (INDEPENDENT_AMBULATORY_CARE_PROVIDER_SITE_OTHER): Payer: BLUE CROSS/BLUE SHIELD | Admitting: Family Medicine

## 2017-03-21 VITALS — BP 148/84 | HR 82 | Temp 98.7°F | Resp 16 | Wt 257.0 lb

## 2017-03-21 DIAGNOSIS — E269 Hyperaldosteronism, unspecified: Secondary | ICD-10-CM | POA: Diagnosis not present

## 2017-03-21 DIAGNOSIS — F3289 Other specified depressive episodes: Secondary | ICD-10-CM | POA: Diagnosis not present

## 2017-03-21 DIAGNOSIS — R110 Nausea: Secondary | ICD-10-CM

## 2017-03-21 DIAGNOSIS — E669 Obesity, unspecified: Secondary | ICD-10-CM | POA: Diagnosis not present

## 2017-03-21 DIAGNOSIS — Z9049 Acquired absence of other specified parts of digestive tract: Secondary | ICD-10-CM

## 2017-03-21 DIAGNOSIS — IMO0001 Reserved for inherently not codable concepts without codable children: Secondary | ICD-10-CM

## 2017-03-21 DIAGNOSIS — I10 Essential (primary) hypertension: Secondary | ICD-10-CM

## 2017-03-21 DIAGNOSIS — Z6841 Body Mass Index (BMI) 40.0 and over, adult: Secondary | ICD-10-CM

## 2017-03-21 NOTE — Progress Notes (Signed)
Patient: Peter [name] Male    DOB: Jul 30, 1960   56 y.o.   MRN: [id number] Visit Date: 03/21/2017  Today's Provider: [name], MD   Chief Complaint  Patient presents with  . Follow-up   Subjective:    HPI Pt is here for a 1 month follow up from his appendectomy. He reports that he is getting stronger and continuing his OT and PT. He is still having to take Zofran regularly. He was started on Amlodipine per Colletta Maryland with [facility] pt BP was 160/100. He has not checked his BP at home.   Pt has had evaluation for hyperaldosteonism by Dr Gabriel Carina. He is slowly feeling better. He had a ruptured appendix followed by necrotizing infection of the wound for which she was in the hospital for more than 2 months followed by at least a month in rehab.  Allergies  Allergen Reactions  . Oxycodone Other (See Comments)    Causes Hallucinations, auditory and tactile  . Isosorbide     Headache, nausea, vomiting     Current Outpatient Prescriptions:  .  amLODipine (NORVASC) 5 MG tablet, Take 1 tablet (5 mg total) by mouth daily., Disp: 30 tablet, Rfl: 12 .  aspirin EC 81 MG tablet, Take 81 mg by mouth daily., Disp: , Rfl:  .  atorvastatin (LIPITOR) 40 MG tablet, Take 1 tablet (40 mg total) by mouth daily. (Patient taking differently: Take 40 mg by mouth every evening. ), Disp: 30 tablet, Rfl: 6 .  clonazePAM (KLONOPIN) 0.5 MG tablet, Take by mouth., Disp: , Rfl:  .  ibuprofen (ADVIL,MOTRIN) 200 MG tablet, Take 800 mg by mouth 3 (three) times daily as needed for headache or moderate pain., Disp: , Rfl:  .  ketoconazole (NIZORAL) 2 % cream, Apply 1 application topically 2 (two) times daily., Disp: 15 g, Rfl: 12 .  lamoTRIgine (LAMICTAL) 200 MG tablet, Take 400 mg by mouth at bedtime. , Disp: , Rfl:  .  latanoprost (XALATAN) 0.005 % ophthalmic solution, Place 1 drop into both eyes at bedtime., Disp: 2.5 mL, Rfl: 12 .  metoprolol tartrate (LOPRESSOR) 25 MG tablet, Take 1 tablet (25 mg  total) by mouth 2 (two) times daily., Disp: 60 tablet, Rfl: 12 .  ondansetron (ZOFRAN-ODT) 8 MG disintegrating tablet, Take 1 tablet (8 mg total) by mouth every 8 (eight) hours as needed for nausea or vomiting., Disp: 90 tablet, Rfl: 0 .  ranitidine (ZANTAC) 300 MG tablet, TAKE 1 TABLET(300 MG) BY MOUTH TWICE DAILY, Disp: 60 tablet, Rfl: 12 .  risperiDONE (RISPERDAL) 2 MG tablet, Take 2 mg by mouth at bedtime., Disp: , Rfl:  .  sertraline (ZOLOFT) 50 MG tablet, Take 100 mg by mouth daily. , Disp: , Rfl:  .  spironolactone (ALDACTONE) 25 MG tablet, Take 25 mg by mouth daily., Disp: , Rfl:  .  EQUETRO 200 MG CP12 12 hr capsule, Take 400-600 mg by mouth 2 (two) times daily. Take 400 mg in the morning & 600 mg in the evening., Disp: , Rfl: 7 .  losartan (COZAAR) 100 MG tablet, TAKE 1 TABLET BY MOUTH EVERY DAY (Patient not taking: Reported on 02/16/2017), Disp: 90 tablet, Rfl: 2 .  MAGNESIUM-OXIDE 400 (241.3 Mg) MG tablet, Take 400 mg by mouth 2 (two) times daily., Disp: , Rfl: 12 .  Melatonin 3 MG TABS, Take by mouth at bedtime., Disp: , Rfl:  .  Multiple Vitamin (MULTIVITAMIN) tablet, Take 1 tablet by mouth daily., Disp: ,  Rfl:  .  potassium chloride SA (K-DUR,KLOR-CON) 20 MEQ tablet, Take 2 tablets (40 mEq total) by mouth 3 (three) times daily. (Patient not taking: Reported on 03/21/2017), Disp: 180 tablet, Rfl: 4 .  Sennosides-Docusate Sodium (SENEXON-S PO), Take by mouth 2 (two) times daily as needed., Disp: , Rfl:  .  sucralfate (CARAFATE) 1 GM/10ML suspension, Take 1 g by mouth 4 (four) times daily., Disp: , Rfl:  .  thiamine (VITAMIN B-1) 100 MG tablet, Take 100 mg by mouth daily., Disp: , Rfl:  .  Vitamin D, Ergocalciferol, (DRISDOL) 50000 units CAPS capsule, Take 1 capsule (50,000 Units total) by mouth every 7 (seven) days. (Patient not taking: Reported on 03/21/2017), Disp: 12 capsule, Rfl: 0  Review of Systems  Constitutional: Positive for fatigue.  HENT: Negative.   Eyes: Negative.     Respiratory: Positive for shortness of breath (nothing new for pt.).   Cardiovascular: Negative.   Gastrointestinal: Positive for abdominal pain and nausea.  Endocrine: Negative.   Genitourinary: Negative.   Musculoskeletal: Negative.   Skin: Negative.   Allergic/Immunologic: Negative.   Neurological: Positive for weakness and numbness (in left outter thigh).  Hematological: Negative.   Psychiatric/Behavioral: Negative.     Social History  Substance Use Topics  . Smoking status: Never Smoker  . Smokeless tobacco: Never Used  . Alcohol use Yes     Comment: occasionally   Objective:   BP (!) 148/84 (BP Location: Left Arm, Patient Position: Sitting, Cuff Size: Large)   Pulse 82   Temp 98.7 F (37.1 C) (Oral)   Resp 16   Wt 257 lb (116.6 kg)   SpO2 96%   BMI 34.86 kg/m  Vitals:   03/21/17 1112  BP: (!) 148/84  Pulse: 82  Resp: 16  Temp: 98.7 F (37.1 C)  TempSrc: Oral  SpO2: 96%  Weight: 257 lb (116.6 kg)     Physical Exam  Constitutional: He is oriented to person, place, and time. He appears well-developed and well-nourished.  Obese white male in no acute distress. He is much brighter and more alert and more himself than on the last visit.  HENT:  Head: Normocephalic and atraumatic.  Eyes: Pupils are equal, round, and reactive to light. Conjunctivae and EOM are normal.  Neck: Normal range of motion. Neck supple.  Cardiovascular: Normal rate, regular rhythm, normal heart sounds and intact distal pulses.   Pulmonary/Chest: Effort normal and breath sounds normal.  Abdominal: Soft.  Musculoskeletal: Normal range of motion.  Neurological: He is alert and oriented to person, place, and time. He has normal reflexes.  Skin: Skin is warm and dry.  Psychiatric: He has a normal mood and affect. His behavior is normal. Judgment and thought content normal.        Assessment & Plan:     1. Essential (primary) hypertension Improved with Amlodipine 5 mg.   2.  Hyperaldosteronism (HCC) Followed by endocrinology. Will contact Dr. Gabriel Carina.   3. 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 Reassured patient that this will be a prolonged course but he is improving regularly.  4. Class 3 obesity with body mass index (BMI) of 50.0 to 59.9 in adult, unspecified obesity type, unspecified whether serious comorbidity present ([place]) Due to illness and hospitalization pt is down 79 pounds.   5. Other depression Follow up with Psychiatry. Improving on medication.  6. Nausea Improving with Zofran     HPI, Exam, and A&P Transcribed  under the direction and in the presence of Richard L. Cranford Mon, MD  Electronically Signed: Katina Dung, [place], MD  [facility]

## 2017-03-22 DIAGNOSIS — K5900 Constipation, unspecified: Secondary | ICD-10-CM | POA: Diagnosis not present

## 2017-03-22 DIAGNOSIS — E785 Hyperlipidemia, unspecified: Secondary | ICD-10-CM | POA: Diagnosis not present

## 2017-03-22 DIAGNOSIS — F064 Anxiety disorder due to known physiological condition: Secondary | ICD-10-CM | POA: Diagnosis not present

## 2017-03-22 DIAGNOSIS — Z434 Encounter for attention to other artificial openings of digestive tract: Secondary | ICD-10-CM | POA: Diagnosis not present

## 2017-03-22 DIAGNOSIS — T814XXD Infection following a procedure, subsequent encounter: Secondary | ICD-10-CM | POA: Diagnosis not present

## 2017-03-22 DIAGNOSIS — H401130 Primary open-angle glaucoma, bilateral, stage unspecified: Secondary | ICD-10-CM | POA: Diagnosis not present

## 2017-03-22 DIAGNOSIS — F3110 Bipolar disorder, current episode manic without psychotic features, unspecified: Secondary | ICD-10-CM | POA: Diagnosis not present

## 2017-03-22 DIAGNOSIS — I10 Essential (primary) hypertension: Secondary | ICD-10-CM | POA: Diagnosis not present

## 2017-03-22 DIAGNOSIS — I2510 Atherosclerotic heart disease of native coronary artery without angina pectoris: Secondary | ICD-10-CM | POA: Diagnosis not present

## 2017-03-22 DIAGNOSIS — K219 Gastro-esophageal reflux disease without esophagitis: Secondary | ICD-10-CM | POA: Diagnosis not present

## 2017-03-22 DIAGNOSIS — Z9181 History of falling: Secondary | ICD-10-CM | POA: Diagnosis not present

## 2017-03-24 DIAGNOSIS — F3110 Bipolar disorder, current episode manic without psychotic features, unspecified: Secondary | ICD-10-CM | POA: Diagnosis not present

## 2017-03-24 DIAGNOSIS — F064 Anxiety disorder due to known physiological condition: Secondary | ICD-10-CM | POA: Diagnosis not present

## 2017-03-24 DIAGNOSIS — Z434 Encounter for attention to other artificial openings of digestive tract: Secondary | ICD-10-CM | POA: Diagnosis not present

## 2017-03-24 DIAGNOSIS — E785 Hyperlipidemia, unspecified: Secondary | ICD-10-CM | POA: Diagnosis not present

## 2017-03-24 DIAGNOSIS — I10 Essential (primary) hypertension: Secondary | ICD-10-CM | POA: Diagnosis not present

## 2017-03-24 DIAGNOSIS — K219 Gastro-esophageal reflux disease without esophagitis: Secondary | ICD-10-CM | POA: Diagnosis not present

## 2017-03-24 DIAGNOSIS — Z9181 History of falling: Secondary | ICD-10-CM | POA: Diagnosis not present

## 2017-03-24 DIAGNOSIS — H401130 Primary open-angle glaucoma, bilateral, stage unspecified: Secondary | ICD-10-CM | POA: Diagnosis not present

## 2017-03-24 DIAGNOSIS — K5900 Constipation, unspecified: Secondary | ICD-10-CM | POA: Diagnosis not present

## 2017-03-24 DIAGNOSIS — I2510 Atherosclerotic heart disease of native coronary artery without angina pectoris: Secondary | ICD-10-CM | POA: Diagnosis not present

## 2017-03-24 DIAGNOSIS — T814XXD Infection following a procedure, subsequent encounter: Secondary | ICD-10-CM | POA: Diagnosis not present

## 2017-03-26 ENCOUNTER — Other Ambulatory Visit: Payer: Self-pay | Admitting: Family Medicine

## 2017-03-28 DIAGNOSIS — E785 Hyperlipidemia, unspecified: Secondary | ICD-10-CM | POA: Diagnosis not present

## 2017-03-28 DIAGNOSIS — H401130 Primary open-angle glaucoma, bilateral, stage unspecified: Secondary | ICD-10-CM | POA: Diagnosis not present

## 2017-03-28 DIAGNOSIS — I2510 Atherosclerotic heart disease of native coronary artery without angina pectoris: Secondary | ICD-10-CM | POA: Diagnosis not present

## 2017-03-28 DIAGNOSIS — K5900 Constipation, unspecified: Secondary | ICD-10-CM | POA: Diagnosis not present

## 2017-03-28 DIAGNOSIS — Z434 Encounter for attention to other artificial openings of digestive tract: Secondary | ICD-10-CM | POA: Diagnosis not present

## 2017-03-28 DIAGNOSIS — F064 Anxiety disorder due to known physiological condition: Secondary | ICD-10-CM | POA: Diagnosis not present

## 2017-03-28 DIAGNOSIS — F3110 Bipolar disorder, current episode manic without psychotic features, unspecified: Secondary | ICD-10-CM | POA: Diagnosis not present

## 2017-03-28 DIAGNOSIS — T814XXD Infection following a procedure, subsequent encounter: Secondary | ICD-10-CM | POA: Diagnosis not present

## 2017-03-28 DIAGNOSIS — I10 Essential (primary) hypertension: Secondary | ICD-10-CM | POA: Diagnosis not present

## 2017-03-28 DIAGNOSIS — Z9181 History of falling: Secondary | ICD-10-CM | POA: Diagnosis not present

## 2017-03-28 DIAGNOSIS — K219 Gastro-esophageal reflux disease without esophagitis: Secondary | ICD-10-CM | POA: Diagnosis not present

## 2017-03-29 DIAGNOSIS — T814XXD Infection following a procedure, subsequent encounter: Secondary | ICD-10-CM | POA: Diagnosis not present

## 2017-03-29 DIAGNOSIS — I2510 Atherosclerotic heart disease of native coronary artery without angina pectoris: Secondary | ICD-10-CM | POA: Diagnosis not present

## 2017-03-29 DIAGNOSIS — F064 Anxiety disorder due to known physiological condition: Secondary | ICD-10-CM | POA: Diagnosis not present

## 2017-03-29 DIAGNOSIS — E785 Hyperlipidemia, unspecified: Secondary | ICD-10-CM | POA: Diagnosis not present

## 2017-03-29 DIAGNOSIS — Z9181 History of falling: Secondary | ICD-10-CM | POA: Diagnosis not present

## 2017-03-29 DIAGNOSIS — Z434 Encounter for attention to other artificial openings of digestive tract: Secondary | ICD-10-CM | POA: Diagnosis not present

## 2017-03-29 DIAGNOSIS — K219 Gastro-esophageal reflux disease without esophagitis: Secondary | ICD-10-CM | POA: Diagnosis not present

## 2017-03-29 DIAGNOSIS — K5900 Constipation, unspecified: Secondary | ICD-10-CM | POA: Diagnosis not present

## 2017-03-29 DIAGNOSIS — I10 Essential (primary) hypertension: Secondary | ICD-10-CM | POA: Diagnosis not present

## 2017-03-29 DIAGNOSIS — F3110 Bipolar disorder, current episode manic without psychotic features, unspecified: Secondary | ICD-10-CM | POA: Diagnosis not present

## 2017-03-29 DIAGNOSIS — H401130 Primary open-angle glaucoma, bilateral, stage unspecified: Secondary | ICD-10-CM | POA: Diagnosis not present

## 2017-03-30 DIAGNOSIS — H401130 Primary open-angle glaucoma, bilateral, stage unspecified: Secondary | ICD-10-CM | POA: Diagnosis not present

## 2017-03-30 DIAGNOSIS — Z434 Encounter for attention to other artificial openings of digestive tract: Secondary | ICD-10-CM | POA: Diagnosis not present

## 2017-03-30 DIAGNOSIS — K219 Gastro-esophageal reflux disease without esophagitis: Secondary | ICD-10-CM | POA: Diagnosis not present

## 2017-03-30 DIAGNOSIS — K5900 Constipation, unspecified: Secondary | ICD-10-CM | POA: Diagnosis not present

## 2017-03-30 DIAGNOSIS — Z9181 History of falling: Secondary | ICD-10-CM | POA: Diagnosis not present

## 2017-03-30 DIAGNOSIS — T814XXD Infection following a procedure, subsequent encounter: Secondary | ICD-10-CM | POA: Diagnosis not present

## 2017-03-30 DIAGNOSIS — I10 Essential (primary) hypertension: Secondary | ICD-10-CM | POA: Diagnosis not present

## 2017-03-30 DIAGNOSIS — F064 Anxiety disorder due to known physiological condition: Secondary | ICD-10-CM | POA: Diagnosis not present

## 2017-03-30 DIAGNOSIS — F3110 Bipolar disorder, current episode manic without psychotic features, unspecified: Secondary | ICD-10-CM | POA: Diagnosis not present

## 2017-03-30 DIAGNOSIS — I2510 Atherosclerotic heart disease of native coronary artery without angina pectoris: Secondary | ICD-10-CM | POA: Diagnosis not present

## 2017-03-30 DIAGNOSIS — E785 Hyperlipidemia, unspecified: Secondary | ICD-10-CM | POA: Diagnosis not present

## 2017-03-31 DIAGNOSIS — K219 Gastro-esophageal reflux disease without esophagitis: Secondary | ICD-10-CM | POA: Diagnosis not present

## 2017-03-31 DIAGNOSIS — I2510 Atherosclerotic heart disease of native coronary artery without angina pectoris: Secondary | ICD-10-CM | POA: Diagnosis not present

## 2017-03-31 DIAGNOSIS — Z434 Encounter for attention to other artificial openings of digestive tract: Secondary | ICD-10-CM | POA: Diagnosis not present

## 2017-03-31 DIAGNOSIS — F3110 Bipolar disorder, current episode manic without psychotic features, unspecified: Secondary | ICD-10-CM | POA: Diagnosis not present

## 2017-03-31 DIAGNOSIS — T814XXD Infection following a procedure, subsequent encounter: Secondary | ICD-10-CM | POA: Diagnosis not present

## 2017-03-31 DIAGNOSIS — K5900 Constipation, unspecified: Secondary | ICD-10-CM | POA: Diagnosis not present

## 2017-03-31 DIAGNOSIS — Z9181 History of falling: Secondary | ICD-10-CM | POA: Diagnosis not present

## 2017-03-31 DIAGNOSIS — E785 Hyperlipidemia, unspecified: Secondary | ICD-10-CM | POA: Diagnosis not present

## 2017-03-31 DIAGNOSIS — I10 Essential (primary) hypertension: Secondary | ICD-10-CM | POA: Diagnosis not present

## 2017-03-31 DIAGNOSIS — F064 Anxiety disorder due to known physiological condition: Secondary | ICD-10-CM | POA: Diagnosis not present

## 2017-03-31 DIAGNOSIS — H401130 Primary open-angle glaucoma, bilateral, stage unspecified: Secondary | ICD-10-CM | POA: Diagnosis not present

## 2017-04-02 DIAGNOSIS — H401130 Primary open-angle glaucoma, bilateral, stage unspecified: Secondary | ICD-10-CM | POA: Diagnosis not present

## 2017-04-02 DIAGNOSIS — E785 Hyperlipidemia, unspecified: Secondary | ICD-10-CM | POA: Diagnosis not present

## 2017-04-02 DIAGNOSIS — F064 Anxiety disorder due to known physiological condition: Secondary | ICD-10-CM | POA: Diagnosis not present

## 2017-04-02 DIAGNOSIS — F3110 Bipolar disorder, current episode manic without psychotic features, unspecified: Secondary | ICD-10-CM | POA: Diagnosis not present

## 2017-04-02 DIAGNOSIS — Z434 Encounter for attention to other artificial openings of digestive tract: Secondary | ICD-10-CM | POA: Diagnosis not present

## 2017-04-02 DIAGNOSIS — T814XXD Infection following a procedure, subsequent encounter: Secondary | ICD-10-CM | POA: Diagnosis not present

## 2017-04-02 DIAGNOSIS — K5900 Constipation, unspecified: Secondary | ICD-10-CM | POA: Diagnosis not present

## 2017-04-02 DIAGNOSIS — I2510 Atherosclerotic heart disease of native coronary artery without angina pectoris: Secondary | ICD-10-CM | POA: Diagnosis not present

## 2017-04-02 DIAGNOSIS — Z9181 History of falling: Secondary | ICD-10-CM | POA: Diagnosis not present

## 2017-04-02 DIAGNOSIS — K219 Gastro-esophageal reflux disease without esophagitis: Secondary | ICD-10-CM | POA: Diagnosis not present

## 2017-04-02 DIAGNOSIS — I10 Essential (primary) hypertension: Secondary | ICD-10-CM | POA: Diagnosis not present

## 2017-04-08 ENCOUNTER — Telehealth: Payer: Self-pay | Admitting: Emergency Medicine

## 2017-04-08 DIAGNOSIS — E269 Hyperaldosteronism, unspecified: Secondary | ICD-10-CM

## 2017-04-08 NOTE — Telephone Encounter (Signed)
ok 

## 2017-04-08 NOTE — Telephone Encounter (Signed)
Pt informed about spirolactone. He wanted to see if you would order outpatient OT and PT for him because his in home PT and OT told him that he could do outpatient now. Please advise.

## 2017-04-09 NOTE — Telephone Encounter (Signed)
Hyperaldosteronism

## 2017-04-09 NOTE — Telephone Encounter (Signed)
What diagnoses to use for the referral-Anastasiya V Hopkins, RMA

## 2017-04-11 NOTE — Telephone Encounter (Signed)
Order for referral has been placed.

## 2017-04-19 ENCOUNTER — Other Ambulatory Visit: Payer: Self-pay | Admitting: *Deleted

## 2017-04-19 MED ORDER — ATORVASTATIN CALCIUM 40 MG PO TABS: 40.0000 mg | ORAL_TABLET | Freq: Every day | ORAL | 0 refills | Status: DC

## 2017-04-19 NOTE — Telephone Encounter (Signed)
Pt overdue for appointment former [facility] pt. Please advise if ok to refill.

## 2017-04-19 NOTE — Telephone Encounter (Signed)
Patient needs follow up appointment for any further refills

## 2017-04-25 DIAGNOSIS — F3175 Bipolar disorder, in partial remission, most recent episode depressed: Secondary | ICD-10-CM | POA: Diagnosis not present

## 2017-04-27 ENCOUNTER — Encounter: Payer: Self-pay | Admitting: Family Medicine

## 2017-04-27 ENCOUNTER — Ambulatory Visit (INDEPENDENT_AMBULATORY_CARE_PROVIDER_SITE_OTHER): Payer: BLUE CROSS/BLUE SHIELD | Admitting: Family Medicine

## 2017-04-27 VITALS — BP 152/88 | HR 74 | Temp 97.8°F | Resp 14 | Wt 256.0 lb

## 2017-04-27 DIAGNOSIS — I10 Essential (primary) hypertension: Secondary | ICD-10-CM

## 2017-04-27 MED ORDER — METOPROLOL TARTRATE 25 MG PO TABS: 25.0000 mg | ORAL_TABLET | Freq: Three times a day (TID) | ORAL | 2 refills | Status: DC

## 2017-04-27 NOTE — Progress Notes (Signed)
Patient: Peter [name] Male    DOB: 01/12/1961   56 y.o.   MRN: [id number] Visit Date: 04/27/2017  Today's Provider: [name], MD   Chief Complaint  Patient presents with  . Hypertension   Subjective:    HPI Pt is here today for elevated BP. He reports that his BP has been running 150's/100's at home. He had a headache yesterday and has had them recently but not as bad as yesterdays.      Allergies  Allergen Reactions  . Oxycodone Other (See Comments)    Causes Hallucinations, auditory and tactile  . Isosorbide     Headache, nausea, vomiting     Current Outpatient Prescriptions:  .  amLODipine (NORVASC) 5 MG tablet, Take 1 tablet (5 mg total) by mouth daily., Disp: 30 tablet, Rfl: 12 .  aspirin EC 81 MG tablet, Take 81 mg by mouth daily., Disp: , Rfl:  .  atorvastatin (LIPITOR) 40 MG tablet, Take 1 tablet (40 mg total) by mouth daily., Disp: 90 tablet, Rfl: 0 .  clonazePAM (KLONOPIN) 0.5 MG tablet, Take 0.5 mg by mouth 2 (two) times daily. , Disp: , Rfl:  .  EQUETRO 200 MG CP12 12 hr capsule, Take 400-600 mg by mouth 3 (three) times daily. , Disp: , Rfl: 7 .  ibuprofen (ADVIL,MOTRIN) 200 MG tablet, Take 800 mg by mouth 3 (three) times daily as needed for headache or moderate pain., Disp: , Rfl:  .  ketoconazole (NIZORAL) 2 % cream, Apply 1 application topically 2 (two) times daily., Disp: 15 g, Rfl: 12 .  lamoTRIgine (LAMICTAL) 200 MG tablet, Take 400 mg by mouth at bedtime. , Disp: , Rfl:  .  latanoprost (XALATAN) 0.005 % ophthalmic solution, Place 1 drop into both eyes at bedtime., Disp: 2.5 mL, Rfl: 12 .  losartan (COZAAR) 100 MG tablet, TAKE 1 TABLET BY MOUTH EVERY DAY, Disp: 90 tablet, Rfl: 2 .  MAGNESIUM-OXIDE 400 (241.3 Mg) MG tablet, Take 400 mg by mouth 2 (two) times daily., Disp: , Rfl: 12 .  Melatonin 3 MG TABS, Take by mouth at bedtime., Disp: , Rfl:  .  metoprolol tartrate (LOPRESSOR) 25 MG tablet, Take 1 tablet (25 mg total) by mouth 2 (two)  times daily., Disp: 60 tablet, Rfl: 12 .  Multiple Vitamin (MULTIVITAMIN) tablet, Take 1 tablet by mouth daily., Disp: , Rfl:  .  ondansetron (ZOFRAN-ODT) 8 MG disintegrating tablet, Take 1 tablet (8 mg total) by mouth every 8 (eight) hours as needed for nausea or vomiting., Disp: 90 tablet, Rfl: 0 .  ranitidine (ZANTAC) 300 MG tablet, TAKE 1 TABLET(300 MG) BY MOUTH TWICE DAILY, Disp: 60 tablet, Rfl: 11 .  risperiDONE (RISPERDAL) 2 MG tablet, Take 2 mg by mouth at bedtime., Disp: , Rfl:  .  Sennosides-Docusate Sodium (SENEXON-S PO), Take by mouth 2 (two) times daily as needed., Disp: , Rfl:  .  sertraline (ZOLOFT) 50 MG tablet, Take 100 mg by mouth daily. , Disp: , Rfl:  .  sucralfate (CARAFATE) 1 GM/10ML suspension, Take 1 g by mouth 4 (four) times daily., Disp: , Rfl:  .  thiamine (VITAMIN B-1) 100 MG tablet, Take 100 mg by mouth daily., Disp: , Rfl:  .  potassium chloride SA (K-DUR,KLOR-CON) 20 MEQ tablet, Take 2 tablets (40 mEq total) by mouth 3 (three) times daily. (Patient not taking: Reported on 03/21/2017), Disp: 180 tablet, Rfl: 4 .  spironolactone (ALDACTONE) 25 MG tablet, Take 50 mg by  mouth daily. , Disp: , Rfl:  .  Vitamin D, Ergocalciferol, (DRISDOL) 50000 units CAPS capsule, Take 1 capsule (50,000 Units total) by mouth every 7 (seven) days. (Patient not taking: Reported on 03/21/2017), Disp: 12 capsule, Rfl: 0  Review of Systems  Constitutional: Negative.   HENT: Negative.   Respiratory: Negative.   Cardiovascular: Negative.   Gastrointestinal: Negative.   Endocrine: Negative.   Genitourinary: Negative.   Musculoskeletal: Negative.   Skin: Negative.   Allergic/Immunologic: Negative.   Neurological: Positive for headaches.  Hematological: Negative.   Psychiatric/Behavioral: Negative.     Social History  Substance Use Topics  . Smoking status: Never Smoker  . Smokeless tobacco: Never Used  . Alcohol use Yes     Comment: occasionally   Objective:   BP (!) 152/88 (BP  Location: Left Arm, Patient Position: Sitting, Cuff Size: Large)   Pulse 74   Temp 97.8 F (36.6 C) (Oral)   Resp 14   Wt 256 lb (116.1 kg)   BMI 34.72 kg/m  Vitals:   04/27/17 1155  BP: (!) 152/88  Pulse: 74  Resp: 14  Temp: 97.8 F (36.6 C)  TempSrc: Oral  Weight: 256 lb (116.1 kg)     Physical Exam  Constitutional: He is oriented to person, place, and time. He appears well-developed and well-nourished.  Eyes: Pupils are equal, round, and reactive to light. Conjunctivae and EOM are normal.  Neck: Normal range of motion. Neck supple.  Cardiovascular: Normal rate, regular rhythm, normal heart sounds and intact distal pulses.   Pulmonary/Chest: Effort normal and breath sounds normal.  Musculoskeletal: Normal range of motion.  Neurological: He is alert and oriented to person, place, and time. He has normal reflexes.  Skin: Skin is warm and dry.  Psychiatric: He has a normal mood and affect. His behavior is normal. Judgment and thought content normal.        Assessment & Plan:     1. Essential (primary) hypertension Increase metoprolol to three times a day until he can get back on his Spirolactone.  - metoprolol tartrate (LOPRESSOR) 25 MG tablet; Take 1 tablet (25 mg total) by mouth 3 (three) times daily.  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 3.Depression Improved since last OV     HPI, Exam, and A&P Transcribed under the direction and in the presence of Rashawn Rolon L. Cranford Mon, MD  Electronically Signed: Katina Dung, CMA I have done the exam and reviewed the above chart and it is accurate to the best of my knowledge. Development worker, community has been used in this note in any air is in the dictation or transcription are unintentional.   [name], MD  [place]

## 2017-04-30 ENCOUNTER — Ambulatory Visit: Payer: BLUE CROSS/BLUE SHIELD | Admitting: Family Medicine

## 2017-05-02 DIAGNOSIS — Z434 Encounter for attention to other artificial openings of digestive tract: Secondary | ICD-10-CM | POA: Diagnosis not present

## 2017-05-02 DIAGNOSIS — T8144XD Sepsis following a procedure, subsequent encounter: Secondary | ICD-10-CM | POA: Diagnosis not present

## 2017-05-02 DIAGNOSIS — I10 Essential (primary) hypertension: Secondary | ICD-10-CM | POA: Diagnosis not present

## 2017-05-02 DIAGNOSIS — I2510 Atherosclerotic heart disease of native coronary artery without angina pectoris: Secondary | ICD-10-CM | POA: Diagnosis not present

## 2017-05-09 ENCOUNTER — Ambulatory Visit: Payer: BLUE CROSS/BLUE SHIELD | Attending: Family Medicine

## 2017-05-09 DIAGNOSIS — M6281 Muscle weakness (generalized): Secondary | ICD-10-CM | POA: Diagnosis not present

## 2017-05-09 DIAGNOSIS — R2689 Other abnormalities of gait and mobility: Secondary | ICD-10-CM | POA: Diagnosis not present

## 2017-05-09 NOTE — Patient Instructions (Signed)
IT band supine stretch Piriformis supine stretch  2x30 seconds 2x/day

## 2017-05-09 NOTE — Therapy (Signed)
[place] MAIN [facility] SERVICES [address] [place], [place], [address] Phone: [phone]   Fax:  [phone]  Physical Therapy Evaluation  Patient Details  Name: Peter [name] MRN: [id number] Date of Birth: 12-May-1961 Referring Provider: Dr. Thurston Hole   Encounter Date: 05/09/2017  PT End of Session - 05/09/17 1252    Visit Number  1    Number of Visits  7    Date for PT Re-Evaluation  07/04/17    Authorization Type  1/7 visits    PT Start Time  1008    PT Stop Time  1110    PT Time Calculation (min)  62 min    Equipment Utilized During Treatment  Gait belt    Activity Tolerance  Patient tolerated treatment well;Patient limited by pain;Patient limited by fatigue    Behavior During Therapy  [facility] for tasks assessed/performed       Past Medical History:  Diagnosis Date  . Blood in stool   . Difficult intubation   . Glaucoma    since 2004  . Hypertension    since 2000  . Obesity, unspecified   . Ulcer 2011   gastric  . Unspecified hemorrhoids without mention of complication     Past Surgical History:  Procedure Laterality Date  . CHOLECYSTECTOMY  1992  . COLONOSCOPY W/ POLYPECTOMY  05/22/2010   38mmtransverse colon polyp, traditional serrated adenoma,negative for high grade dysplasia & malignancy. rectal polyp-71mm,negative for dysplasia & malignancy.  Marland Kitchen KNEE SURGERY Left 2010    There were no vitals filed for this visit.   Subjective Assessment - 05/09/17 1021    Subjective  Patient is a pleasant 56 year old male who presents to physical therapy for hyperaldosternoism related weakness.     Patient is accompained by:  Family member    Pertinent History  Patient is a pleasant 56 year old male who presents to physical therapy for hyperaldosternoism related weakness. Patient having weakness after appendix surgery on April 28th. Feels like lost two months of time, regained memory in June. Was in the hospital (three hospitals, one  long term rehab, and one rehab) for three and a half month. Had most of PT and OT at [facility], then had home healthcare PT and OT. Main problem is left leg, is tingling in it, not able to put full weight onto it. Tight L piriformis.     Limitations  Lifting;Standing;Walking;House hold activities;Other (comment)    How long can you sit comfortably?  n/a    How long can you stand comfortably?  with walker ok, challenged without walker. couple minutes with walker    How long can you walk comfortably?  1000 ft     Patient Stated Goals  walking more, stronger and more secure with left leg    Currently in Pain?  No/denies            [facility] PT Assessment - 05/09/17 0001      Assessment   Medical Diagnosis  hyperaldosternosim    Referring Provider  Dr. Thurston Hole    Onset Date/Surgical Date  10/30/16    Hand Dominance  Right    Next MD Visit  04/30/17    Prior Therapy  yes      Precautions   Precautions  None      Restrictions   Weight Bearing Restrictions  No      Balance Screen   Has the patient fallen in the past 6 months  Yes    How many times?  1    Has the patient had a decrease in activity level because of a fear of falling?   Yes    Is the patient reluctant to leave their home because of a fear of falling?   Yes      [place] residence    Living Arrangements  Spouse/significant other    Available Help at Discharge  Family    Type of [place] to enter    Entrance Stairs-Number of Steps  3    Entrance Stairs-Rails  Right    [place]  One level    [place] seat;Tub bench;Walker - 2 wheels;Cane - single point;Wheelchair - manual;Grab bars - tub/shower;Toilet riser      Prior Function   Level of Independence  Independent    Vocation  -- Personnel officer    Leisure  work on the computer, read, Oceanographer, photography      Cognition   Overall Cognitive Status  Within Functional Limits  for tasks assessed      Observation/Other Assessments   Observations  decreased weight acceptance to LLE      Sensation   Light Touch  Impaired by gross assessment      Coordination   Gross Motor Movements are Fluid and Coordinated  No      Posture/Postural Control   Posture/Postural Control  Postural limitations    Postural Limitations  Rounded Shoulders;Forward head;Weight shift right      Transfers   Transfers  Independent with all Transfers    Five time sit to stand comments   requires UE assistance      Ambulation/Gait   Ambulation/Gait  Yes    Ambulation/Gait Assistance  6: Modified independent (Device/Increase time)    Assistive device  None    Gait Pattern  Decreased weight shift to left;Decreased trunk rotation;Decreased dorsiflexion - left    Ambulation Surface  Level;Indoor    Gait velocity  .32m/s           PAIN: None at this time Gets shooting L lateral hip pain  POSTURE: Forward head rounded shoulder, decreased weight shift over L hip.   PROM/AROM: L hamstring excessively tight L IT band tight L Piriformis tight, tender to palpation, increased shooting pain to lateral thigh upon palpation.   STRENGTH:  Graded on a 0-5 scale Muscle Group Left Right                          Hip Flex 4-/5 4/5  Hip Abd 4-/5 4/5  Hip Add 4-/5 4/5  Hip Ext 3+/5 4/5  Hip IR/ER    Knee Flex 4-/5 4/5  Knee Ext 3+/5 4/5  Ankle DF 4-/5 4/5  Ankle PF 4-/5 4/5   SENSATION: L lateral aspect of thigh decreased    FUNCTIONAL MOBILITY: Slow but functional tranfers     OUTCOME MEASURES: TEST Outcome Interpretation  5 times sit<>stand 17 sec >60 yo, >15 sec indicates increased risk for falls  10 meter walk test     .83             m/s <1.0 m/s indicates increased risk for falls; limited community ambulator  Timed up and Go        14         sec <14  sec indicates increased risk for falls  LEFS 28/80                 Objective measurements completed on  examination: See above findings.    Treat:  IT band stretch Piriformis stretch  transfers         PT Education - 05/09/17 1242    Education provided  Yes    Education Details  HEP, POC    Person(s) Educated  Patient;Spouse    Methods  Explanation;Demonstration;Handout;Verbal cues    Comprehension  Verbalized understanding;Returned demonstration       PT Short Term Goals - 05/09/17 1605      PT SHORT TERM GOAL #1   Title   Patient (< 76 years old) will complete five times sit to stand test in < 14 seconds indicating an increased LE strength and improved balance    Baseline  11/5: 17 seconds    Time  2    Period  Weeks    Status  New    Target Date  05/23/17      PT SHORT TERM GOAL #2   Title  Patient will be independent in home exercise program to improve strength/mobility for better functional independence with ADLs.    Baseline  hep given    Time  2    Period  Weeks    Status  New    Target Date  05/23/17      PT SHORT TERM GOAL #3   Title  Patient will deny any falls over past 4 weeks to demonstrate improved safety awareness at home and work    Baseline  one fall     Time  2    Period  Weeks    Status  New    Target Date  05/23/17        PT Long Term Goals - 05/09/17 1709      PT LONG TERM GOAL #1   Title  Patient (< 36 years old) will complete five times sit to stand test in < 10 seconds indicating an increased LE strength and improved balance.    Baseline  11/5: 17 seconds    Time  8    Period  Weeks    Status  New    Target Date  07/04/17      PT LONG TERM GOAL #2   Title  Patient will increase 10 meter walk test to >1.60m/s as to improve gait speed for better community ambulation and to reduce fall risk.    Baseline  11/5: .83 m/s    Time  8    Period  Weeks    Status  New    Target Date  07/04/17      PT LONG TERM GOAL #3   Title  Patient will reduce timed up and go to <11 seconds to reduce fall risk and demonstrate improved transfer/gait  ability.    Baseline  11/5: 14    Time  8    Period  Weeks    Status  New    Target Date  07/04/17      PT LONG TERM GOAL #4   Title  Patient will increase lower extremity functional scale to >60/80 to demonstrate improved functional mobility and increased tolerance with ADLs.     Baseline  05/09/17: 28/80    Time  8    Period  Weeks    Status  New    Target Date  07/04/17  Plan - 05/09/17 1253    Clinical Impression Statement   Patient is a pleasant 56 year old male who presents to physical therapy for hyperaldosternoism related weakness. Left LE musculature weaker than RLE, however noted weakness bilaterally. Left IT band and piriformis tight and tender to palpation, limiting patient's mobility and increased pain/neurological symptoms of leg when contracting. 5x STS=17 seconds with BUE support, 10MWT= .83 m/s, TUG=14 seconds, LEFS=28/80. Patient will benefit from skilled physical therapy to improve mobility and strength for return to previous level of function.     History and Personal Factors relevant to plan of care:  This patient presents with  3, personal factors/ comorbidities, and 1-2, body elements including body structures and functions, activity limitations and or participation restrictions. Patient's condition is, evolving    Clinical Presentation  Evolving    Clinical Presentation due to:  progressively steady change in level of funciton    Clinical Decision Making  Moderate    Rehab Potential  Fair    Clinical Impairments Affecting Rehab Potential  long duration of hospitalization, hyperaldosternism, high blood pressure, (+) family support    PT Frequency  1x / week    PT Duration  8 weeks    PT Treatment/Interventions  Electrical Stimulation;Moist Heat;Traction;Ultrasound;Biofeedback;Therapeutic exercise;Therapeutic activities;Functional mobility training;Stair training;Gait training;DME Instruction;Balance training;Neuromuscular re-education;Patient/family  education;Manual techniques;Passive range of motion;Energy conservation;Taping;Visual/perceptual remediation/compensation    PT Next Visit Plan  hamstring stretch, LE strengthening, recumbent bike    PT Home Exercise Plan  see above    Consulted and Agree with Plan of Care  Patient;Family member/caregiver    Family Member Consulted  wife       Patient will benefit from skilled therapeutic intervention in order to improve the following deficits and impairments:  Abnormal gait, Decreased activity tolerance, Decreased balance, Decreased endurance, Decreased mobility, Decreased range of motion, Decreased safety awareness, Difficulty walking, Decreased strength, Hypomobility, Impaired flexibility, Impaired perceived functional ability, Increased muscle spasms, Impaired sensation, Postural dysfunction, Improper body mechanics, Pain  Visit Diagnosis: Muscle weakness (generalized)  Other abnormalities of gait and mobility     Problem List Patient Active Problem List   Diagnosis Date Noted  . Open abdominal incision with drainage   . 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 Necrotizing soft tissue infection 11/11/2016  . Appendicitis, acute 10/30/2016  . Acute appendicitis with generalized peritonitis   . Hyperaldosteronism ([place]) 09/21/2016  . Class 3 obesity with body mass index (BMI) of 50.0 to 59.9 in adult 09/21/2016  . Decreased libido 09/21/2016  . Shortness of breath 06/15/2016  . Abnormal stress test 06/15/2016  . 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 Avitaminosis D 03/25/2015  . Blood in stool   . Clinical depression 06/21/2011  . Cannot sleep 08/22/2009  . Hypercholesterolemia without hypertriglyceridemia 01/28/2009  . Hay fever 10/22/2008  . Adjustment disorder with mixed anxiety and depressed mood 12/12/2007  . Essential (primary) hypertension 12/12/2007  . Apnea, sleep 12/12/2007   Janna Arch, PT, DPT   Janna Arch 05/09/2017, 5:15 PM  [place] MAIN [facility] SERVICES [address] [place], [place], [address] Phone: [phone]   Fax:  [phone]  Name: Peter [name] MRN: [id number] Date of Birth: Mar 03, 1961

## 2017-05-11 ENCOUNTER — Ambulatory Visit: Payer: BLUE CROSS/BLUE SHIELD

## 2017-05-11 DIAGNOSIS — Z79899 Other long term (current) drug therapy: Secondary | ICD-10-CM | POA: Diagnosis not present

## 2017-05-16 ENCOUNTER — Ambulatory Visit: Payer: BLUE CROSS/BLUE SHIELD

## 2017-05-16 DIAGNOSIS — I10 Essential (primary) hypertension: Secondary | ICD-10-CM | POA: Diagnosis not present

## 2017-05-18 ENCOUNTER — Ambulatory Visit: Payer: BLUE CROSS/BLUE SHIELD

## 2017-05-18 DIAGNOSIS — R2689 Other abnormalities of gait and mobility: Secondary | ICD-10-CM | POA: Diagnosis not present

## 2017-05-18 DIAGNOSIS — M6281 Muscle weakness (generalized): Secondary | ICD-10-CM | POA: Diagnosis not present

## 2017-05-18 NOTE — Therapy (Signed)
[place] MAIN [facility] SERVICES [address] [place], [place], [address] Phone: [phone]   Fax:  [phone]  Physical Therapy Treatment  Patient Details  Name: Peter [name] MRN: [id number] Date of Birth: May 05, 1961 Referring Provider: Dr. Thurston Hole   Encounter Date: 05/18/2017  PT End of Session - 05/18/17 1125    Visit Number  2    Number of Visits  7    Date for PT Re-Evaluation  07/04/17    Authorization Type  2/7 visits    PT Start Time  1030    PT Stop Time  1116    PT Time Calculation (min)  46 min    Equipment Utilized During Treatment  Gait belt    Activity Tolerance  Patient tolerated treatment well;Patient limited by pain;Patient limited by fatigue    Behavior During Therapy  [facility] for tasks assessed/performed       Past Medical History:  Diagnosis Date  . Blood in stool   . Difficult intubation   . Glaucoma    since 2004  . Hypertension    since 2000  . Obesity, unspecified   . Ulcer 2011   gastric  . Unspecified hemorrhoids without mention of complication     Past Surgical History:  Procedure Laterality Date  . CHOLECYSTECTOMY  1992  . COLONOSCOPY W/ POLYPECTOMY  05/22/2010   37mmtransverse colon polyp, traditional serrated adenoma,negative for high grade dysplasia & malignancy. rectal polyp-43mm,negative for dysplasia & malignancy.  Marland Kitchen KNEE SURGERY Left 2010    There were no vitals filed for this visit.  Subjective Assessment - 05/18/17 1031    Subjective  Patient feeling stiff today, reports doing stretches regularly, except yesterday evening and this morning.     Patient is accompained by:  Family member    Pertinent History  Patient is a pleasant 56 year old male who presents to physical therapy for hyperaldosternoism related weakness. Patient having weakness after appendix surgery on April 28th. Feels like lost two months of time, regained memory in June. Was in the hospital (three hospitals, one long  term rehab, and one rehab) for three and a half month. Had most of PT and OT at [facility], then had home healthcare PT and OT. Main problem is left leg, is tingling in it, not able to put full weight onto it. Tight L piriformis.     Limitations  Lifting;Standing;Walking;House hold activities;Other (comment)    How long can you sit comfortably?  n/a    How long can you stand comfortably?  with walker ok, challenged without walker. couple minutes with walker    How long can you walk comfortably?  1000 ft     Patient Stated Goals  walking more, stronger and more secure with left leg    Currently in Pain?  No/denies      Manual PROM: L hamstring stretch 3x 60 seconds with distraction, L hamstring with dorsiflexion stretch 2x60 seconds, L IT Band stretch 2x 60 seconds,  L piriformis stretch modified seated 2x 60 seconds Thoracic rotations in supine, 20x  TherEx Step over and back half foam roller with BUE support 15x each leg Side step over and back half foam roller with BUE support 15x each leg cues for upright posture Step forward and backwards over orange hurdle 15x with BUE support  Three colored mini knobby bosu intervention with PT calling color and leg to touch color with decreasing UE support, BUE to SUE to no UE support.  Opposite UE and LE raises 5x, 4x,3x,2x,1x, 10x with 1x switch      Pt. response to medical necessity:  Patient will continue to benefit from skilled physical therapy to improve mobility and strength for return to previous level of function.                  PT Education - 05/18/17 1124    Education provided  Yes    Education Details  additional HEP, coordination and awareness of feet in space    Person(s) Educated  Patient    Methods  Explanation;Demonstration    Comprehension  Verbalized understanding;Returned demonstration       PT Short Term Goals - 05/09/17 1605      PT SHORT TERM GOAL #1   Title   Patient (< 25 years old) will complete  five times sit to stand test in < 14 seconds indicating an increased LE strength and improved balance    Baseline  11/5: 17 seconds    Time  2    Period  Weeks    Status  New    Target Date  05/23/17      PT SHORT TERM GOAL #2   Title  Patient will be independent in home exercise program to improve strength/mobility for better functional independence with ADLs.    Baseline  hep given    Time  2    Period  Weeks    Status  New    Target Date  05/23/17      PT SHORT TERM GOAL #3   Title  Patient will deny any falls over past 4 weeks to demonstrate improved safety awareness at home and work    Baseline  one fall     Time  2    Period  Weeks    Status  New    Target Date  05/23/17        PT Long Term Goals - 05/09/17 1709      PT LONG TERM GOAL #1   Title  Patient (< 66 years old) will complete five times sit to stand test in < 10 seconds indicating an increased LE strength and improved balance.    Baseline  11/5: 17 seconds    Time  8    Period  Weeks    Status  New    Target Date  07/04/17      PT LONG TERM GOAL #2   Title  Patient will increase 10 meter walk test to >1.46m/s as to improve gait speed for better community ambulation and to reduce fall risk.    Baseline  11/5: .83 m/s    Time  8    Period  Weeks    Status  New    Target Date  07/04/17      PT LONG TERM GOAL #3   Title  Patient will reduce timed up and go to <11 seconds to reduce fall risk and demonstrate improved transfer/gait ability.    Baseline  11/5: 14    Time  8    Period  Weeks    Status  New    Target Date  07/04/17      PT LONG TERM GOAL #4   Title  Patient will increase lower extremity functional scale to >60/80 to demonstrate improved functional mobility and increased tolerance with ADLs.     Baseline  05/09/17: 28/80    Time  8    Period  Weeks  Status  New    Target Date  07/04/17            Plan - 05/18/17 1128    Clinical Impression Statement  Patient has limited rotation  of hips bilaterally in addition to tight lumbar paraspinals and LE musculature has resulted in limited movement in patient's lower body. Prolonged stretching improved mobility. Patient has disconnect between feet when not focusing on task and was educated on awareness and movement for safe mobility. Patient will continue to benefit from skilled physical therapy to improve mobility and strength for return to previous level of function.     Rehab Potential  Fair    Clinical Impairments Affecting Rehab Potential  long duration of hospitalization, hyperaldosternism, high blood pressure, (+) family support    PT Frequency  1x / week    PT Duration  8 weeks    PT Treatment/Interventions  Electrical Stimulation;Moist Heat;Traction;Ultrasound;Biofeedback;Therapeutic exercise;Therapeutic activities;Functional mobility training;Stair training;Gait training;DME Instruction;Balance training;Neuromuscular re-education;Patient/family education;Manual techniques;Passive range of motion;Energy conservation;Taping;Visual/perceptual remediation/compensation    PT Next Visit Plan  hamstring stretch, LE strengthening, recumbent bike    PT Home Exercise Plan  see above    Consulted and Agree with Plan of Care  Patient;Family member/caregiver    Family Member Consulted  wife       Patient will benefit from skilled therapeutic intervention in order to improve the following deficits and impairments:  Abnormal gait, Decreased activity tolerance, Decreased balance, Decreased endurance, Decreased mobility, Decreased range of motion, Decreased safety awareness, Difficulty walking, Decreased strength, Hypomobility, Impaired flexibility, Impaired perceived functional ability, Increased muscle spasms, Impaired sensation, Postural dysfunction, Improper body mechanics, Pain  Visit Diagnosis: Muscle weakness (generalized)  Other abnormalities of gait and mobility     Problem List Patient Active Problem List   Diagnosis Date  Noted  . Open abdominal incision with drainage   . 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 Necrotizing soft tissue infection 11/11/2016  . Appendicitis, acute 10/30/2016  . Acute appendicitis with generalized peritonitis   . Hyperaldosteronism ([place]) 09/21/2016  . Class 3 obesity with body mass index (BMI) of 50.0 to 59.9 in adult 09/21/2016  . Decreased libido 09/21/2016  . Shortness of breath 06/15/2016  . Abnormal stress test 06/15/2016  . 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 Avitaminosis D 03/25/2015  . Blood in stool   . Clinical depression 06/21/2011  . Cannot sleep 08/22/2009  . Hypercholesterolemia without hypertriglyceridemia 01/28/2009  . Hay fever 10/22/2008  . Adjustment disorder with mixed anxiety and depressed mood 12/12/2007  . Essential (primary) hypertension 12/12/2007  . Apnea, sleep 12/12/2007  Janna Arch, PT, DPT    Janna Arch 05/18/2017, 11:29 AM  [place] MAIN [facility] SERVICES [address] [place], [place], [address] Phone: [phone]   Fax:  [phone]  Name: Peter [name] MRN: [id number] Date of Birth: Jan 28, 1961

## 2017-05-19 DIAGNOSIS — I10 Essential (primary) hypertension: Secondary | ICD-10-CM | POA: Diagnosis not present

## 2017-05-21 ENCOUNTER — Other Ambulatory Visit: Payer: Self-pay | Admitting: Cardiovascular Disease

## 2017-05-24 DIAGNOSIS — H40053 Ocular hypertension, bilateral: Secondary | ICD-10-CM | POA: Diagnosis not present

## 2017-05-24 DIAGNOSIS — H401131 Primary open-angle glaucoma, bilateral, mild stage: Secondary | ICD-10-CM | POA: Diagnosis not present

## 2017-05-25 ENCOUNTER — Ambulatory Visit: Payer: BLUE CROSS/BLUE SHIELD

## 2017-05-25 DIAGNOSIS — M6281 Muscle weakness (generalized): Secondary | ICD-10-CM | POA: Diagnosis not present

## 2017-05-25 DIAGNOSIS — R2689 Other abnormalities of gait and mobility: Secondary | ICD-10-CM | POA: Diagnosis not present

## 2017-05-25 DIAGNOSIS — I10 Essential (primary) hypertension: Secondary | ICD-10-CM | POA: Diagnosis not present

## 2017-05-25 NOTE — Therapy (Signed)
[place] MAIN [facility] SERVICES [address] [place], [place], [address] Phone: [phone]   Fax:  [phone]  Physical Therapy Treatment  Patient Details  Name: Peter [name] MRN: [id number] Date of Birth: 15-Jan-1961 Referring Provider: Dr. Thurston Hole   Encounter Date: 05/25/2017  PT End of Session - 05/25/17 1125    Visit Number  3    Number of Visits  7    Date for PT Re-Evaluation  07/04/17    Authorization Type  3/7 visits    PT Start Time  1030    PT Stop Time  1118    PT Time Calculation (min)  48 min    Equipment Utilized During Treatment  Gait belt    Activity Tolerance  Patient tolerated treatment well;Patient limited by pain;Patient limited by fatigue    Behavior During Therapy  [facility] for tasks assessed/performed       Past Medical History:  Diagnosis Date  . Blood in stool   . Difficult intubation   . Glaucoma    since 2004  . Hypertension    since 2000  . Obesity, unspecified   . Ulcer 2011   gastric  . Unspecified hemorrhoids without mention of complication     Past Surgical History:  Procedure Laterality Date  . APPLICATION OF WOUND VAC N/A 11/16/2016   Procedure: APPLICATION OF WOUND VAC-ABDOMINAL;  Surgeon: Florene Glen, MD;  Location: [facility] ORS;  Service: General;  Laterality: N/A;  . APPLICATION OF WOUND VAC N/A 11/18/2016   Procedure: APPLICATION OF WOUND VAC;  Surgeon: Florene Glen, MD;  Location: [facility] ORS;  Service: General;  Laterality: N/A;  change  . APPLICATION OF WOUND VAC N/A 11/23/2016   Procedure: WOUND VAC CHANGE;  Surgeon: Olean Ree, MD;  Location: [facility] ORS;  Service: General;  Laterality: N/A;  wound vac application  . CARDIAC CATHETERIZATION Left 06/23/2016   Procedure: Left Heart Cath and Coronary Angiography;  Surgeon: Nelva Bush, MD;  Location: [place] [facility];  Service: Cardiovascular;  Laterality: Left;  . CHOLECYSTECTOMY  1992  . COLONOSCOPY W/ POLYPECTOMY   05/22/2010   32mmtransverse colon polyp, traditional serrated adenoma,negative for high grade dysplasia & malignancy. rectal polyp-62mm,negative for dysplasia & malignancy.  Marland Kitchen DRESSING CHANGE UNDER ANESTHESIA N/A 11/21/2016   Procedure: DRESSING CHANGE UNDER ANESTHESIA;  Surgeon: Florene Glen, MD;  Location: [facility] ORS;  Service: General;  Laterality: N/A;  . KNEE SURGERY Left 2010  . LAPAROSCOPIC APPENDECTOMY N/A 10/30/2016   Procedure: APPENDECTOMY LAPAROSCOPIC changed  to open application of wound vac;  Surgeon: [name], MD;  Location: [facility] ORS;  Service: General;  Laterality: N/A;  . LAPAROTOMY N/A 11/11/2016   Procedure: EXPLORATORY LAPAROTOMY, DEBRIDEMENT OF ABDOMINAL WOUND, ABDOMINAL [place];  Surgeon: [name], MD;  Location: [facility] OR[facility];  Service: General;  Laterality: N/A;  . LAPAROTOMY N/A 11/12/2016   Procedure: EXPLORATORY LAPAROTOMY;  Surgeon: Clayburn Pert, MD;  Location: [facility] OR[facility];  Service: General;  Laterality: N/A;  . LAPAROTOMY N/A 11/14/2016   Procedure: EXPLORATORY LAPAROTOMY; Irrigation, partial closure;  Surgeon: Clayburn Pert, MD;  Location: [facility] ORS;  Service: General;  Laterality: N/A;    There were no vitals filed for this visit.  Subjective Assessment - 05/25/17 1123    Subjective  Patient reports headache today that began this morning. Has been compliant with HEP. Has a doctor appt after session today about blood pressure regulation.     Patient is accompained by:  Family  member    Pertinent History  Patient is a pleasant 56 year old male who presents to physical therapy for hyperaldosternoism related weakness. Patient having weakness after appendix surgery on April 28th. Feels like lost two months of time, regained memory in June. Was in the hospital (three hospitals, one long term rehab, and one rehab) for three and a half month. Had most of PT and OT at [facility], then had home healthcare PT and OT. Main problem is left leg, is tingling in it, not able to  put full weight onto it. Tight L piriformis.     Limitations  Lifting;Standing;Walking;House hold activities;Other (comment)    How long can you sit comfortably?  n/a    How long can you stand comfortably?  with walker ok, challenged without walker. couple minutes with walker    How long can you walk comfortably?  1000 ft     Patient Stated Goals  walking more, stronger and more secure with left leg    Currently in Pain?  Yes    Pain Score  2     Pain Location  Back    Pain Orientation  Lower    Pain Descriptors / Indicators  Aching    Pain Type  Chronic pain    Pain Onset  1 to 4 weeks ago       Manual PROM: L hamstring stretch 3x 60 seconds with distraction, L hamstring with dorsiflexion stretch 2x60 seconds, L IT  L piriformis stretch modified seated 2x 60 seconds Thoracic rotations in supine, 20x  TherEx Step over and back orange hurdle with SUE support 10x each leg Side step over and back orange hurdle with SUE support 10x each leg cues for upright posture High knee marches in // bars with decreasing UE support from bilateral to no UE support 10x  Seated weight shift 20x Seated rocker board 20x df and pf Sit to stands from plinth table Ambulate with no AD 6x10 seconds  Airex pad:      UE raises 10x each arm     Weight shift heel raises 10x each leg      Eyes closed 60 seconds  Opposite UE and LE raises 5x, 4x,3x,2x,1x, 10x with 1x switch . Cues for abdominal activation to increase postural correction.    Pt. response to medical necessity:  Patient will continue to benefit from skilled physical therapy to improve mobility and strength for return to previous level of function.                          PT Education - 05/25/17 1124    Education provided  Yes    Education Details  abdominal contraction for upright posture    Person(s) Educated  Patient    Methods  Explanation;Demonstration;Verbal cues    Comprehension  Verbalized  understanding;Returned demonstration       PT Short Term Goals - 05/09/17 1605      PT SHORT TERM GOAL #1   Title   Patient (< 62 years old) will complete five times sit to stand test in < 14 seconds indicating an increased LE strength and improved balance    Baseline  11/5: 17 seconds    Time  2    Period  Weeks    Status  New    Target Date  05/23/17      PT SHORT TERM GOAL #2   Title  Patient will be independent in home exercise  program to improve strength/mobility for better functional independence with ADLs.    Baseline  hep given    Time  2    Period  Weeks    Status  New    Target Date  05/23/17      PT SHORT TERM GOAL #3   Title  Patient will deny any falls over past 4 weeks to demonstrate improved safety awareness at home and work    Baseline  one fall     Time  2    Period  Weeks    Status  New    Target Date  05/23/17        PT Long Term Goals - 05/09/17 1709      PT LONG TERM GOAL #1   Title  Patient (< 70 years old) will complete five times sit to stand test in < 10 seconds indicating an increased LE strength and improved balance.    Baseline  11/5: 17 seconds    Time  8    Period  Weeks    Status  New    Target Date  07/04/17      PT LONG TERM GOAL #2   Title  Patient will increase 10 meter walk test to >1.40m/s as to improve gait speed for better community ambulation and to reduce fall risk.    Baseline  11/5: .83 m/s    Time  8    Period  Weeks    Status  New    Target Date  07/04/17      PT LONG TERM GOAL #3   Title  Patient will reduce timed up and go to <11 seconds to reduce fall risk and demonstrate improved transfer/gait ability.    Baseline  11/5: 14    Time  8    Period  Weeks    Status  New    Target Date  07/04/17      PT LONG TERM GOAL #4   Title  Patient will increase lower extremity functional scale to >60/80 to demonstrate improved functional mobility and increased tolerance with ADLs.     Baseline  05/09/17: 28/80    Time  8     Period  Weeks    Status  New    Target Date  07/04/17            Plan - 05/25/17 1127    Clinical Impression Statement  Patient is challenged by interventions that require SLS on LLE due to weak musculature. Education and performance of interventions that require hip movement for improved smoothness and coordination was improved with patient demonstrating tight low back and LE musculature improvement with repetition. Patient will continue to benefit from skilled physical therapy to improve mobility and strength for return to previous level of function.     Rehab Potential  Fair    Clinical Impairments Affecting Rehab Potential  long duration of hospitalization, hyperaldosternism, high blood pressure, (+) family support    PT Frequency  1x / week    PT Duration  8 weeks    PT Treatment/Interventions  Electrical Stimulation;Moist Heat;Traction;Ultrasound;Biofeedback;Therapeutic exercise;Therapeutic activities;Functional mobility training;Stair training;Gait training;DME Instruction;Balance training;Neuromuscular re-education;Patient/family education;Manual techniques;Passive range of motion;Energy conservation;Taping;Visual/perceptual remediation/compensation    PT Next Visit Plan  hamstring stretch, LE strengthening, recumbent bike    PT Home Exercise Plan  see above    Consulted and Agree with Plan of Care  Patient;Family member/caregiver    Family Member Consulted  wife  Patient will benefit from skilled therapeutic intervention in order to improve the following deficits and impairments:  Abnormal gait, Decreased activity tolerance, Decreased balance, Decreased endurance, Decreased mobility, Decreased range of motion, Decreased safety awareness, Difficulty walking, Decreased strength, Hypomobility, Impaired flexibility, Impaired perceived functional ability, Increased muscle spasms, Impaired sensation, Postural dysfunction, Improper body mechanics, Pain  Visit Diagnosis: Muscle  weakness (generalized)  Other abnormalities of gait and mobility     Problem List Patient Active Problem List   Diagnosis Date Noted  . Open abdominal incision with drainage   . 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 Necrotizing soft tissue infection 11/11/2016  . Appendicitis, acute 10/30/2016  . Acute appendicitis with generalized peritonitis   . Hyperaldosteronism ([place]) 09/21/2016  . Class 3 obesity with body mass index (BMI) of 50.0 to 59.9 in adult 09/21/2016  . Decreased libido 09/21/2016  . Shortness of breath 06/15/2016  . Abnormal stress test 06/15/2016  . 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 Avitaminosis D 03/25/2015  . Blood in stool   . Clinical depression 06/21/2011  . Cannot sleep 08/22/2009  . Hypercholesterolemia without hypertriglyceridemia 01/28/2009  . Hay fever 10/22/2008  . Adjustment disorder with mixed anxiety and depressed mood 12/12/2007  . Essential (primary) hypertension 12/12/2007  . Apnea, sleep 12/12/2007   Janna Arch, PT, DPT   Janna Arch 05/25/2017, 11:27 AM  [place] MAIN [facility] SERVICES [address] [place], [place], [address] Phone: [phone]   Fax:  [phone]  Name: Peter [name] MRN: [id number] Date of Birth: 1960-07-12

## 2017-05-30 ENCOUNTER — Ambulatory Visit: Payer: BLUE CROSS/BLUE SHIELD

## 2017-05-30 DIAGNOSIS — M6281 Muscle weakness (generalized): Secondary | ICD-10-CM | POA: Diagnosis not present

## 2017-05-30 DIAGNOSIS — I10 Essential (primary) hypertension: Secondary | ICD-10-CM | POA: Diagnosis not present

## 2017-05-30 DIAGNOSIS — R2689 Other abnormalities of gait and mobility: Secondary | ICD-10-CM | POA: Diagnosis not present

## 2017-05-30 NOTE — Therapy (Signed)
[place] MAIN [facility] SERVICES [address] [place], [place], [address] Phone: [phone]   Fax:  [phone]  Physical Therapy Treatment  Patient Details  Name: Peter [name] MRN: [id number] Date of Birth: [birth year] Referring Provider: Dr. Thurston Hole   Encounter Date: 05/30/2017  PT End of Session - 05/30/17 1020    Visit Number  4    Number of Visits  7    Date for PT Re-Evaluation  07/04/17    Authorization Type  4/7 visits    PT Start Time  1030    PT Stop Time  1115    PT Time Calculation (min)  45 min    Equipment Utilized During Treatment  Gait belt    Activity Tolerance  Patient tolerated treatment well;Patient limited by pain;Patient limited by fatigue    Behavior During Therapy  [facility] for tasks assessed/performed       Past Medical History:  Diagnosis Date  . Blood in stool   . Difficult intubation   . Glaucoma    since 2004  . Hypertension    since 2000  . Obesity, unspecified   . Ulcer 2011   gastric  . Unspecified hemorrhoids without mention of complication     Past Surgical History:  Procedure Laterality Date  . APPLICATION OF WOUND VAC N/A 11/16/2016   Procedure: APPLICATION OF WOUND VAC-ABDOMINAL;  Surgeon: Florene Glen, MD;  Location: [facility] ORS;  Service: General;  Laterality: N/A;  . APPLICATION OF WOUND VAC N/A 11/18/2016   Procedure: APPLICATION OF WOUND VAC;  Surgeon: Florene Glen, MD;  Location: [facility] OR[facility];  Service: General;  Laterality: N/A;  change  . APPLICATION OF WOUND VAC N/A 11/23/2016   Procedure: WOUND VAC CHANGE;  Surgeon: Olean Ree, MD;  Location: [facility] OR[facility];  Service: General;  Laterality: N/A;  wound vac application  . CARDIAC CATHETERIZATION Left 06/23/2016   Procedure: Left Heart Cath and Coronary Angiography;  Surgeon: Nelva Bush, MD;  Location: [place] CV LAB;  Service: Cardiovascular;  Laterality: Left;  . CHOLECYSTECTOMY  1992  . COLONOSCOPY W/ POLYPECTOMY   05/22/2010   21mmtransverse colon polyp, traditional serrated adenoma,negative for high grade dysplasia & malignancy. rectal polyp-34mm,negative for dysplasia & malignancy.  Marland Kitchen DRESSING CHANGE UNDER ANESTHESIA N/A 11/21/2016   Procedure: DRESSING CHANGE UNDER ANESTHESIA;  Surgeon: Florene Glen, MD;  Location: [facility];  Service: General;  Laterality: N/A;  . KNEE SURGERY Left 2010  . LAPAROSCOPIC APPENDECTOMY N/A 10/30/2016   Procedure: APPENDECTOMY LAPAROSCOPIC changed  to open application of wound vac;  Surgeon: [name], MD;  Location: [facility];  Service: General;  Laterality: N/A;  . LAPAROTOMY N/A 11/11/2016   Procedure: EXPLORATORY LAPAROTOMY, DEBRIDEMENT OF ABDOMINAL WOUND, ABDOMINAL [place];  Surgeon: [name], MD;  Location: [facility];  Service: General;  Laterality: N/A;  . LAPAROTOMY N/A 11/12/2016   Procedure: EXPLORATORY LAPAROTOMY;  Surgeon: Clayburn Pert, MD;  Location: [facility];  Service: General;  Laterality: N/A;  . LAPAROTOMY N/A 11/14/2016   Procedure: EXPLORATORY LAPAROTOMY; Irrigation, partial closure;  Surgeon: Clayburn Pert, MD;  Location: [facility];  Service: General;  Laterality: N/A;    There were no vitals filed for this visit.  Subjective Assessment - 05/30/17 1032    Subjective  Patient reports compliance with exercises and stretches. Has some slight lateral pain in L thigh.     Patient is accompained by:  Family member    Pertinent History  Patient is  a pleasant 56 year old male who presents to physical therapy for hyperaldosternoism related weakness. Patient having weakness after appendix surgery on April 28th. Feels like lost two months of time, regained memory in June. Was in the hospital (three hospitals, one long term rehab, and one rehab) for three and a half month. Had most of PT and OT at [facility], then had home healthcare PT and OT. Main problem is left leg, is tingling in it, not able to put full weight onto it. Tight L piriformis.      Limitations  Lifting;Standing;Walking;House hold activities;Other (comment)    How long can you sit comfortably?  n/a    How long can you stand comfortably?  with walker ok, challenged without walker. couple minutes with walker    How long can you walk comfortably?  1000 ft     Patient Stated Goals  walking more, stronger and more secure with left leg    Currently in Pain?  Yes    Pain Score  2     Pain Location  Leg    Pain Orientation  Left;Lateral    Pain Descriptors / Indicators  Aching    Pain Type  Acute pain    Pain Onset  1 to 4 weeks ago        TherEx Weight shift left to right in // bars Weight shift with right foot elevated for increased weight shift to left  crane lunges with 6" step 10x each leg  High knee marches in // bars without UE support with cues for increasing UE swing to promote more improved pattern of ambulation 12x  Seated LAQ with 5 second holds 10x each leg  Resisted knee flexion GTB 15x each leg  Resisted PF GTB  15x  Resisted DF GTB 15x  Supine hip flexor stretch R due to cramping      Pt. response to medical necessity:  Patient will continue to benefit from skilled physical therapy to improve mobility and strength for return to previous level of function                        PT Education - 05/30/17 1019    Education provided  Yes    Education Details  functional strength and weight shifting    Person(s) Educated  Patient    Methods  Explanation;Demonstration;Verbal cues    Comprehension  Verbalized understanding;Returned demonstration       PT Short Term Goals - 05/09/17 1605      PT SHORT TERM GOAL #1   Title   Patient (< 16 years old) will complete five times sit to stand test in < 14 seconds indicating an increased LE strength and improved balance    Baseline  11/5: 17 seconds    Time  2    Period  Weeks    Status  New    Target Date  05/23/17      PT SHORT TERM GOAL #2   Title  Patient will be independent in  home exercise program to improve strength/mobility for better functional independence with ADLs.    Baseline  hep given    Time  2    Period  Weeks    Status  New    Target Date  05/23/17      PT SHORT TERM GOAL #3   Title  Patient will deny any falls over past 4 weeks to demonstrate improved safety awareness at home and work  Baseline  one fall     Time  2    Period  Weeks    Status  New    Target Date  05/23/17        PT Long Term Goals - 05/09/17 1709      PT LONG TERM GOAL #1   Title  Patient (< 61 years old) will complete five times sit to stand test in < 10 seconds indicating an increased LE strength and improved balance.    Baseline  11/5: 17 seconds    Time  8    Period  Weeks    Status  New    Target Date  07/04/17      PT LONG TERM GOAL #2   Title  Patient will increase 10 meter walk test to >1.46m/s as to improve gait speed for better community ambulation and to reduce fall risk.    Baseline  11/5: .83 m/s    Time  8    Period  Weeks    Status  New    Target Date  07/04/17      PT LONG TERM GOAL #3   Title  Patient will reduce timed up and go to <11 seconds to reduce fall risk and demonstrate improved transfer/gait ability.    Baseline  11/5: 14    Time  8    Period  Weeks    Status  New    Target Date  07/04/17      PT LONG TERM GOAL #4   Title  Patient will increase lower extremity functional scale to >60/80 to demonstrate improved functional mobility and increased tolerance with ADLs.     Baseline  05/09/17: 28/80    Time  8    Period  Weeks    Status  New    Target Date  07/04/17            Plan - 05/30/17 1205    Clinical Impression Statement  Patient improved weight shift over LLE after weight shifting interventions with decreased visual cueing required. Patient LLE strength challenged by standing interventions and fatigued quickly.  Patient will continue to benefit from skilled physical therapy to improve mobility and strength for return  to previous level of function    Rehab Potential  Fair    Clinical Impairments Affecting Rehab Potential  long duration of hospitalization, hyperaldosternism, high blood pressure, (+) family support    PT Frequency  1x / week    PT Duration  8 weeks    PT Treatment/Interventions  Electrical Stimulation;Moist Heat;Traction;Ultrasound;Biofeedback;Therapeutic exercise;Therapeutic activities;Functional mobility training;Stair training;Gait training;DME Instruction;Balance training;Neuromuscular re-education;Patient/family education;Manual techniques;Passive range of motion;Energy conservation;Taping;Visual/perceptual remediation/compensation    PT Next Visit Plan  hamstring stretch, LE strengthening, recumbent bike    PT Home Exercise Plan  see above    Consulted and Agree with Plan of Care  Patient;Family member/caregiver    Family Member Consulted  wife       Patient will benefit from skilled therapeutic intervention in order to improve the following deficits and impairments:  Abnormal gait, Decreased activity tolerance, Decreased balance, Decreased endurance, Decreased mobility, Decreased range of motion, Decreased safety awareness, Difficulty walking, Decreased strength, Hypomobility, Impaired flexibility, Impaired perceived functional ability, Increased muscle spasms, Impaired sensation, Postural dysfunction, Improper body mechanics, Pain  Visit Diagnosis: Muscle weakness (generalized)  Other abnormalities of gait and mobility     Problem List Patient Active Problem List   Diagnosis Date Noted  . Open abdominal incision with drainage   .  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 Necrotizing soft tissue infection 11/11/2016  . Appendicitis, acute 10/30/2016  . Acute appendicitis with generalized peritonitis   . 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 Class 3 obesity with body mass index (BMI) of 50.0 to 59.9 in adult 09/21/2016  . Decreased libido 09/21/2016  . Shortness of breath  06/15/2016  . Abnormal stress test 06/15/2016  . 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 Avitaminosis D 03/25/2015  . Blood in stool   . Clinical depression 06/21/2011  . Cannot sleep 08/22/2009  . Hypercholesterolemia without hypertriglyceridemia 01/28/2009  . Hay fever 10/22/2008  . Adjustment disorder with mixed anxiety and depressed mood 12/12/2007  . Essential (primary) hypertension 12/12/2007  . Apnea, sleep 12/12/2007   Janna Arch, PT, DPT   Janna Arch 05/30/2017, 12:06 PM  [place] MAIN [facility] SERVICES [address] [place], [place], [address] Phone: [phone]   Fax:  [phone]  Name: Peter [name] MRN: [id number] Date of Birth: 1960/10/05

## 2017-05-31 ENCOUNTER — Ambulatory Visit (INDEPENDENT_AMBULATORY_CARE_PROVIDER_SITE_OTHER): Payer: BLUE CROSS/BLUE SHIELD | Admitting: Family Medicine

## 2017-05-31 ENCOUNTER — Encounter: Payer: Self-pay | Admitting: Family Medicine

## 2017-05-31 VITALS — BP 150/76 | HR 76 | Temp 97.7°F | Resp 16 | Ht 72.0 in | Wt 263.0 lb

## 2017-05-31 DIAGNOSIS — Z0000 Encounter for general adult medical examination without abnormal findings: Secondary | ICD-10-CM

## 2017-05-31 DIAGNOSIS — Z125 Encounter for screening for malignant neoplasm of prostate: Secondary | ICD-10-CM | POA: Diagnosis not present

## 2017-05-31 DIAGNOSIS — Z23 Encounter for immunization: Secondary | ICD-10-CM | POA: Diagnosis not present

## 2017-05-31 DIAGNOSIS — Z79899 Other long term (current) drug therapy: Secondary | ICD-10-CM

## 2017-05-31 NOTE — Patient Instructions (Addendum)
Try Peri colace for constipation and if this does not work then try [employer]. These are both over the counter.

## 2017-05-31 NOTE — Progress Notes (Signed)
Patient: Peter [name], Male    DOB: Sep 14, 1960, 56 y.o.   MRN: [id number] Visit Date: 05/31/2017  Today's Provider: [name], MD   Chief Complaint  Patient presents with  . Annual Exam   Subjective:    Annual physical exam Peter [name] is a 56 y.o. male who presents today for health maintenance and complete physical. He feels fairly well. He reports exercising, with PT. He reports he is sleeping well.  ----------------------------------------------------------------- Colonoscopy- 01/10/17 stool in entire colon, normal from what was seen. Dr. Annamarie Major.  Review of Systems  Constitutional: Negative.   HENT: Negative.   Eyes: Negative.   Respiratory: Negative.   Cardiovascular: Negative.   Gastrointestinal: Positive for constipation.  Endocrine: Negative.   Genitourinary: Negative.   Musculoskeletal: Negative.   Skin: Negative.   Allergic/Immunologic: Negative.   Neurological: Positive for headaches.  Hematological: Negative.   Psychiatric/Behavioral: Negative.     Social History      He  reports that  has never smoked. he has never used smokeless tobacco. He reports that he drinks alcohol. He reports that he does not use drugs.       Social History   Socioeconomic History  . Marital status: Married    Spouse name: None  . Number of children: None  . Years of education: None  . Highest education level: None  Social Needs  . Financial resource strain: None  . Food insecurity - worry: None  . Food insecurity - inability: None  . Transportation needs - medical: None  . Transportation needs - non-medical: None  Occupational History  . None  Tobacco Use  . Smoking status: Never Smoker  . Smokeless tobacco: Never Used  Substance and Sexual Activity  . Alcohol use: Yes    Comment: occasionally  . Drug use: No  . Sexual activity: None  Other Topics Concern  . None  Social History Narrative  . None    Past Medical History:  Diagnosis  Date  . Blood in stool   . Difficult intubation   . Glaucoma    since 2004  . Hypertension    since 2000  . Obesity, unspecified   . Ulcer 2011   gastric  . Unspecified hemorrhoids without mention of complication      Patient Active Problem List   Diagnosis Date Noted  . Open abdominal incision with drainage   . 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 Necrotizing soft tissue infection 11/11/2016  . Appendicitis, acute 10/30/2016  . Acute appendicitis with generalized peritonitis   . Hyperaldosteronism ([place]) 09/21/2016  . Class 3 obesity with body mass index (BMI) of 50.0 to 59.9 in adult 09/21/2016  . Decreased libido 09/21/2016  . Shortness of breath 06/15/2016  . Abnormal stress test 06/15/2016  . 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 Avitaminosis D 03/25/2015  . Blood in stool   . Clinical depression 06/21/2011  . Cannot sleep 08/22/2009  . Hypercholesterolemia without hypertriglyceridemia 01/28/2009  . Hay fever 10/22/2008  . Adjustment disorder with mixed anxiety and depressed mood 12/12/2007  . Essential (primary) hypertension 12/12/2007  . Apnea, sleep 12/12/2007    Past Surgical History:  Procedure Laterality Date  . APPLICATION OF WOUND VAC N/A 11/16/2016   Procedure: APPLICATION OF WOUND VAC-ABDOMINAL;  Surgeon: Florene Glen, MD;  Location: [facility];  Service: General;  Laterality: N/A;  . APPLICATION OF WOUND VAC N/A 11/18/2016   Procedure: APPLICATION OF WOUND VAC;  Surgeon: Phoebe Perch  E, MD;  Location: [facility];  Service: General;  Laterality: N/A;  change  . APPLICATION OF WOUND VAC N/A 11/23/2016   Procedure: WOUND VAC CHANGE;  Surgeon: Olean Ree, MD;  Location: [facility];  Service: General;  Laterality: N/A;  wound vac application  . CARDIAC CATHETERIZATION Left 06/23/2016   Procedure: Left Heart Cath and Coronary Angiography;  Surgeon: Nelva Bush, MD;  Location: [place] CV LAB;  Service: Cardiovascular;  Laterality: Left;  .  CHOLECYSTECTOMY  1992  . COLONOSCOPY W/ POLYPECTOMY  05/22/2010   43mmtransverse colon polyp, traditional serrated adenoma,negative for high grade dysplasia & malignancy. rectal polyp-25mm,negative for dysplasia & malignancy.  Marland Kitchen DRESSING CHANGE UNDER ANESTHESIA N/A 11/21/2016   Procedure: DRESSING CHANGE UNDER ANESTHESIA;  Surgeon: Florene Glen, MD;  Location: [facility];  Service: General;  Laterality: N/A;  . KNEE SURGERY Left 2010  . LAPAROSCOPIC APPENDECTOMY N/A 10/30/2016   Procedure: APPENDECTOMY LAPAROSCOPIC changed  to open application of wound vac;  Surgeon: [name], MD;  Location: [facility];  Service: General;  Laterality: N/A;  . LAPAROTOMY N/A 11/11/2016   Procedure: EXPLORATORY LAPAROTOMY, DEBRIDEMENT OF ABDOMINAL WOUND, ABDOMINAL [place];  Surgeon: [name], MD;  Location: [facility];  Service: General;  Laterality: N/A;  . LAPAROTOMY N/A 11/12/2016   Procedure: EXPLORATORY LAPAROTOMY;  Surgeon: Clayburn Pert, MD;  Location: [facility];  Service: General;  Laterality: N/A;  . LAPAROTOMY N/A 11/14/2016   Procedure: EXPLORATORY LAPAROTOMY; Irrigation, partial closure;  Surgeon: Clayburn Pert, MD;  Location: [facility];  Service: General;  Laterality: N/A;    Family History        Family Status  Relation Name Status  . Other  Deceased  . Mother  Deceased  . Father  Deceased  . Sister  Alive  . Sister  Alive        His family history includes Cancer in his other; Hyperlipidemia in his mother; Hypertension in his mother.     Allergies  Allergen Reactions  . Oxycodone Other (See Comments)    Causes Hallucinations, auditory and tactile  . Isosorbide     Headache, nausea, vomiting     Current Outpatient Medications:  .  amLODipine (NORVASC) 5 MG tablet, Take 1 tablet (5 mg total) by mouth daily. (Patient taking differently: Take 10 mg by mouth daily. ), Disp: 30 tablet, Rfl: 12 .  aspirin EC 81 MG tablet, Take 81 mg by mouth daily., Disp: , Rfl:  .  atorvastatin  (LIPITOR) 40 MG tablet, Take 1 tablet (40 mg total) by mouth daily., Disp: 90 tablet, Rfl: 0 .  clonazePAM (KLONOPIN) 0.5 MG tablet, Take 0.5 mg by mouth 2 (two) times daily. , Disp: , Rfl:  .  EQUETRO 200 MG CP12 12 hr capsule, Take 400-600 mg by mouth 3 (three) times daily. , Disp: , Rfl: 7 .  ibuprofen (ADVIL,MOTRIN) 200 MG tablet, Take 800 mg by mouth 3 (three) times daily as needed for headache or moderate pain., Disp: , Rfl:  .  lamoTRIgine (LAMICTAL) 200 MG tablet, Take 400 mg by mouth at bedtime. , Disp: , Rfl:  .  latanoprost (XALATAN) 0.005 % ophthalmic solution, Place 1 drop into both eyes at bedtime., Disp: 2.5 mL, Rfl: 12 .  losartan (COZAAR) 100 MG tablet, TAKE 1 TABLET BY MOUTH EVERY DAY, Disp: 90 tablet, Rfl: 2 .  metoprolol tartrate (LOPRESSOR) 25 MG tablet, Take 1 tablet (25 mg total) by mouth 3 (three) times daily., Disp: 90 tablet, Rfl:  2 .  ondansetron (ZOFRAN-ODT) 8 MG disintegrating tablet, Take 1 tablet (8 mg total) by mouth every 8 (eight) hours as needed for nausea or vomiting., Disp: 90 tablet, Rfl: 0 .  ranitidine (ZANTAC) 300 MG tablet, TAKE 1 TABLET(300 MG) BY MOUTH TWICE DAILY, Disp: 60 tablet, Rfl: 11 .  sertraline (ZOLOFT) 50 MG tablet, Take 100 mg by mouth daily. , Disp: , Rfl:  .  ketoconazole (NIZORAL) 2 % cream, Apply 1 application topically 2 (two) times daily. (Patient not taking: Reported on 05/31/2017), Disp: 15 g, Rfl: 12 .  MAGNESIUM-OXIDE 400 (241.3 Mg) MG tablet, Take 400 mg by mouth 2 (two) times daily., Disp: , Rfl: 12 .  Melatonin 3 MG TABS, Take by mouth at bedtime., Disp: , Rfl:  .  Multiple Vitamin (MULTIVITAMIN) tablet, Take 1 tablet by mouth daily., Disp: , Rfl:  .  potassium chloride SA (K-DUR,KLOR-CON) 20 MEQ tablet, Take 2 tablets (40 mEq total) by mouth 3 (three) times daily. (Patient not taking: Reported on 03/21/2017), Disp: 180 tablet, Rfl: 4 .  risperiDONE (RISPERDAL) 2 MG tablet, Take 2 mg by mouth at bedtime., Disp: , Rfl:  .   Sennosides-Docusate Sodium (SENEXON-S PO), Take by mouth 2 (two) times daily as needed., Disp: , Rfl:  .  spironolactone (ALDACTONE) 25 MG tablet, Take 50 mg by mouth daily. , Disp: , Rfl:  .  sucralfate (CARAFATE) 1 GM/10ML suspension, Take 1 g by mouth 4 (four) times daily., Disp: , Rfl:  .  thiamine (VITAMIN B-1) 100 MG tablet, Take 100 mg by mouth daily., Disp: , Rfl:  .  Vitamin D, Ergocalciferol, (DRISDOL) 50000 units CAPS capsule, Take 1 capsule (50,000 Units total) by mouth every 7 (seven) days. (Patient not taking: Reported on 03/21/2017), Disp: 12 capsule, Rfl: 0   Patient Care Team: Jerrol Banana., MD as PCP - General (Family Medicine)      Objective:   Vitals: BP (!) 150/76 (BP Location: Left Arm, Patient Position: Sitting, Cuff Size: Large)   Pulse 76   Temp 97.7 F (36.5 C) (Oral)   Resp 16   Ht 6' (1.829 m)   Wt 263 lb (119.3 kg)   BMI 35.67 kg/m    Vitals:   05/31/17 1050  BP: (!) 150/76  Pulse: 76  Resp: 16  Temp: 97.7 F (36.5 C)  TempSrc: Oral  Weight: 263 lb (119.3 kg)  Height: 6' (1.829 m)     Physical Exam  Constitutional: He is oriented to person, place, and time. He appears well-developed and well-nourished.  HENT:  Head: Normocephalic and atraumatic.  Right Ear: External ear normal.  Left Ear: External ear normal.  Nose: Nose normal.  Mouth/Throat: Oropharynx is clear and moist.  Eyes: Conjunctivae and EOM are normal. Pupils are equal, round, and reactive to light.  Neck: Normal range of motion. Neck supple.  Cardiovascular: Normal rate, regular rhythm, normal heart sounds and intact distal pulses.  Pulmonary/Chest: Effort normal and breath sounds normal.  Abdominal: Soft. Bowel sounds are normal.  Musculoskeletal: Normal range of motion.  Neurological: He is alert and oriented to person, place, and time. He has normal reflexes.  Skin: Skin is warm and dry.  Psychiatric: He has a normal mood and affect. His behavior is normal. Judgment  and thought content normal.     Depression Screen PHQ 2/9 Scores 09/21/2016 03/25/2015  PHQ - 2 Score 0 0  PHQ- 9 Score 3 -      Assessment & Plan:  Routine Health Maintenance and Physical Exam  Exercise Activities and Dietary recommendations Goals    None      Immunization History  Administered Date(s) Administered  . Influenza Split 04/16/2008, 04/08/2009, 03/04/2010, 04/06/2011  . Influenza,inj,Quad PF,6+ Mos 04/21/2013, 03/19/2014, 03/25/2015, 05/04/2016  . Tdap 04/06/2011, 04/18/2016    Health Maintenance  Topic Date Due  . INFLUENZA VACCINE  02/02/2017  . TETANUS/TDAP  04/18/2026  . COLONOSCOPY  01/11/2027  . Hepatitis C Screening  Completed  . HIV Screening  Completed     Discussed health benefits of physical activity, and encouraged him to engage in regular exercise appropriate for his age and condition.  Chronic Constipation Try Pericolace then Glycolax in 1-2 weeks if not improving.   --------------------------------------------------------------------   I have done the exam and reviewed the above chart and it is accurate to the best of my knowledge. Development worker, community has been used in this note in any air is in the dictation or transcription are unintentional.  [name], MD  [place]

## 2017-06-01 DIAGNOSIS — I10 Essential (primary) hypertension: Secondary | ICD-10-CM | POA: Diagnosis not present

## 2017-06-01 DIAGNOSIS — Z0000 Encounter for general adult medical examination without abnormal findings: Secondary | ICD-10-CM | POA: Diagnosis not present

## 2017-06-01 DIAGNOSIS — Z125 Encounter for screening for malignant neoplasm of prostate: Secondary | ICD-10-CM | POA: Diagnosis not present

## 2017-06-02 ENCOUNTER — Emergency Department: Payer: BLUE CROSS/BLUE SHIELD

## 2017-06-02 ENCOUNTER — Emergency Department
Admission: EM | Admit: 2017-06-02 | Discharge: 2017-06-02 | Disposition: A | Discharge status: Home / Self Care | Payer: BLUE CROSS/BLUE SHIELD | Attending: Student in an Organized Health Care Education/Training Program | Admitting: Student in an Organized Health Care Education/Training Program

## 2017-06-02 ENCOUNTER — Telehealth: Payer: Self-pay

## 2017-06-02 ENCOUNTER — Other Ambulatory Visit: Payer: Self-pay

## 2017-06-02 ENCOUNTER — Encounter: Payer: Self-pay | Admitting: Emergency Medicine

## 2017-06-02 DIAGNOSIS — K5641 Fecal impaction: Secondary | ICD-10-CM

## 2017-06-02 DIAGNOSIS — Z79899 Other long term (current) drug therapy: Secondary | ICD-10-CM | POA: Insufficient documentation

## 2017-06-02 DIAGNOSIS — R109 Unspecified abdominal pain: Secondary | ICD-10-CM | POA: Diagnosis not present

## 2017-06-02 DIAGNOSIS — E876 Hypokalemia: Secondary | ICD-10-CM

## 2017-06-02 DIAGNOSIS — I10 Essential (primary) hypertension: Secondary | ICD-10-CM | POA: Insufficient documentation

## 2017-06-02 DIAGNOSIS — R1110 Vomiting, unspecified: Secondary | ICD-10-CM | POA: Diagnosis not present

## 2017-06-02 DIAGNOSIS — K5900 Constipation, unspecified: Secondary | ICD-10-CM | POA: Diagnosis not present

## 2017-06-02 DIAGNOSIS — R1032 Left lower quadrant pain: Secondary | ICD-10-CM | POA: Diagnosis not present

## 2017-06-02 LAB — COMPREHENSIVE METABOLIC PANEL
ALBUMIN: 3.8 g/dL (ref 3.5–5.0)
ALK PHOS: 144 U/L — AB (ref 38–126)
ALT: 13 U/L — ABNORMAL LOW (ref 17–63)
ANION GAP: 11 (ref 5–15)
AST: 22 U/L (ref 15–41)
BUN: 15 mg/dL (ref 6–20)
CO2: 26 mmol/L (ref 22–32)
Calcium: 9.1 mg/dL (ref 8.9–10.3)
Chloride: 105 mmol/L (ref 101–111)
Creatinine, Ser: 0.72 mg/dL (ref 0.61–1.24)
GFR calc Af Amer: >60 mL/min (ref >60)
GFR calc non Af Amer: >60 mL/min (ref >60)
GLUCOSE: 130 mg/dL — AB (ref 65–99)
POTASSIUM: 2.4 mmol/L — AB (ref 3.5–5.1)
SODIUM: 142 mmol/L (ref 135–145)
Total Bilirubin: 0.7 mg/dL (ref 0.3–1.2)
Total Protein: 7.5 g/dL (ref 6.5–8.1)

## 2017-06-02 LAB — COMPLETE METABOLIC PANEL WITH GFR
AG Ratio: 1.1 (calc) (ref 1.0–2.5)
ALT: 8 U/L — AB (ref 9–46)
AST: 15 U/L (ref 10–35)
Albumin: 3.7 g/dL (ref 3.6–5.1)
Alkaline phosphatase (APISO): 153 U/L — ABNORMAL HIGH (ref 40–115)
BUN: 19 mg/dL (ref 7–25)
CHLORIDE: 106 mmol/L (ref 98–110)
CO2: 30 mmol/L (ref 20–32)
CREATININE: 0.95 mg/dL (ref 0.70–1.33)
Calcium: 9.2 mg/dL (ref 8.6–10.3)
GFR, Est African American: 102 mL/min/{1.73_m2} (ref >=60)
GFR, Est Non African American: 88 mL/min/{1.73_m2} (ref >=60)
GLUCOSE: 84 mg/dL (ref 65–99)
Globulin: 3.5 g/dL (calc) (ref 1.9–3.7)
Potassium: 2.8 mmol/L — ABNORMAL LOW (ref 3.5–5.3)
Sodium: 144 mmol/L (ref 135–146)
TOTAL PROTEIN: 7.2 g/dL (ref 6.1–8.1)
Total Bilirubin: 0.4 mg/dL (ref 0.2–1.2)

## 2017-06-02 LAB — URINALYSIS, COMPLETE (UACMP) WITH MICROSCOPIC
BACTERIA UA: NONE SEEN
Bilirubin Urine: NEGATIVE
Glucose, UA: NEGATIVE mg/dL
HGB URINE DIPSTICK: NEGATIVE
Ketones, ur: NEGATIVE mg/dL
Leukocytes, UA: NEGATIVE
Nitrite: NEGATIVE
PROTEIN: 30 mg/dL — AB
Specific Gravity, Urine: 1.016 (ref 1.005–1.030)
pH: 6 (ref 5.0–8.0)

## 2017-06-02 LAB — CBC WITH DIFFERENTIAL/PLATELET
BASOS ABS: 19 {cells}/uL (ref 0–200)
Basophils Relative: 0.4 %
EOS PCT: 2.5 %
Eosinophils Absolute: 120 cells/uL (ref 15–500)
HCT: 36.1 % — ABNORMAL LOW (ref 38.5–50.0)
Hemoglobin: 11.7 g/dL — ABNORMAL LOW (ref 13.2–17.1)
Lymphs Abs: 1291 cells/uL (ref 850–3900)
MCH: 26.5 pg — ABNORMAL LOW (ref 27.0–33.0)
MCHC: 32.4 g/dL (ref 32.0–36.0)
MCV: 81.7 fL (ref 80.0–100.0)
MONOS PCT: 5.8 %
MPV: 10.5 fL (ref 7.5–12.5)
NEUTROS PCT: 64.4 %
Neutro Abs: 3091 cells/uL (ref 1500–7800)
Platelets: 231 10*3/uL (ref 140–400)
RBC: 4.42 10*6/uL (ref 4.20–5.80)
RDW: 13.9 % (ref 11.0–15.0)
Total Lymphocyte: 26.9 %
WBC mixed population: 278 cells/uL (ref 200–950)
WBC: 4.8 10*3/uL (ref 3.8–10.8)

## 2017-06-02 LAB — CBC
HEMATOCRIT: 38.1 % — AB (ref 40.0–52.0)
HEMOGLOBIN: 12.8 g/dL — AB (ref 13.0–18.0)
MCH: 27.1 pg (ref 26.0–34.0)
MCHC: 33.6 g/dL (ref 32.0–36.0)
MCV: 80.6 fL (ref 80.0–100.0)
Platelets: 208 10*3/uL (ref 150–440)
RBC: 4.72 MIL/uL (ref 4.40–5.90)
RDW: 15.7 % — AB (ref 11.5–14.5)
WBC: 5.6 10*3/uL (ref 3.8–10.6)

## 2017-06-02 LAB — LIPID PANEL
CHOLESTEROL: 141 mg/dL (ref <200)
HDL: 72 mg/dL (ref >40)
LDL Cholesterol (Calc): 50 mg/dL (calc)
Non-HDL Cholesterol (Calc): 69 mg/dL (calc) (ref <130)
Total CHOL/HDL Ratio: 2 (calc) (ref <5.0)
Triglycerides: 106 mg/dL (ref <150)

## 2017-06-02 LAB — LIPASE, BLOOD: Lipase: 41 U/L (ref 11–51)

## 2017-06-02 LAB — TSH: TSH: 1.75 mIU/L (ref 0.40–4.50)

## 2017-06-02 LAB — CARBAMAZEPINE LEVEL, TOTAL: Carbamazepine Lvl: 8.1 mg/L (ref 4.0–12.0)

## 2017-06-02 LAB — PSA: PSA: 0.9 ng/mL (ref <=4.0)

## 2017-06-02 MED ORDER — GLYCERIN (LAXATIVE) 2.1 G RE SUPP
1.0000 | Freq: Once | RECTAL | Status: AC
  Administered 2017-06-02: 1 via RECTAL
  Filled 2017-06-02: qty 1

## 2017-06-02 MED ORDER — POTASSIUM CHLORIDE ER 10 MEQ PO TBCR: 40.0000 meq | EXTENDED_RELEASE_TABLET | Freq: Every day | ORAL | 0 refills | Status: DC

## 2017-06-02 MED ORDER — ONDANSETRON HCL 4 MG/2ML IJ SOLN
4.0000 mg | Freq: Once | INTRAMUSCULAR | Status: AC
  Administered 2017-06-02: 4 mg via INTRAVENOUS
  Filled 2017-06-02: qty 2

## 2017-06-02 MED ORDER — IOPAMIDOL (ISOVUE-300) INJECTION 61%
100.0000 mL | Freq: Once | INTRAVENOUS | Status: AC | PRN
  Administered 2017-06-02: 100 mL via INTRAVENOUS

## 2017-06-02 MED ORDER — POTASSIUM CHLORIDE CRYS ER 20 MEQ PO TBCR
40.0000 meq | EXTENDED_RELEASE_TABLET | Freq: Once | ORAL | Status: AC
  Administered 2017-06-02: 40 meq via ORAL
  Filled 2017-06-02: qty 2

## 2017-06-02 MED ORDER — SODIUM CHLORIDE 0.9 % IV BOLUS (SEPSIS)
500.0000 mL | Freq: Once | INTRAVENOUS | Status: AC
  Administered 2017-06-02: 500 mL via INTRAVENOUS

## 2017-06-02 MED ORDER — LIDOCAINE HCL 2 % EX GEL
1.0000 "application " | Freq: Once | CUTANEOUS | Status: AC
  Administered 2017-06-02: 1 via URETHRAL

## 2017-06-02 MED ORDER — BISACODYL 10 MG RE SUPP
10.0000 mg | Freq: Once | RECTAL | Status: DC
  Filled 2017-06-02: qty 1

## 2017-06-02 MED ORDER — LIDOCAINE HCL 2 % EX GEL
CUTANEOUS | Status: AC
  Administered 2017-06-02: 1 via URETHRAL
  Filled 2017-06-02: qty 10

## 2017-06-02 MED ORDER — GLYCERIN (LAXATIVE) 1.2 G RE SUPP
RECTAL | Status: AC
  Administered 2017-06-02: 1 via RECTAL
  Filled 2017-06-02: qty 1

## 2017-06-02 MED ORDER — GLYCERIN (ADULT) 2 G RE SUPP: 1.0000 | RECTAL | 0 refills | Status: DC | PRN

## 2017-06-02 MED ORDER — MAGNESIUM SULFATE 2 GM/50ML IV SOLN
2.0000 g | Freq: Once | INTRAVENOUS | Status: AC
  Administered 2017-06-02: 2 g via INTRAVENOUS
  Filled 2017-06-02: qty 50

## 2017-06-02 MED ORDER — MAGNESIUM CITRATE PO SOLN: 1.0000 | Freq: Once | ORAL | 0 refills | Status: AC

## 2017-06-02 NOTE — ED Notes (Addendum)
Patient transported to CT 

## 2017-06-02 NOTE — ED Notes (Signed)
EKG performed due to low potassium.  Dr. Quentin Cornwall validated EKG

## 2017-06-02 NOTE — ED Triage Notes (Addendum)
Pt reports constipation x 5 days, states he had open appendectomy in April. Pt c/o intermittent abdominal pain and vomiting.  Pt vomited x 1 today. Pt states he has had some small pellets.  Pt has been taking Colace prescribed by PCP on Tuesday. Pt does have limited mobility. Pt states he is not passing any gas.  Pt states he has strain when he goes to the bathroom. Pt c/o LLQ abdominal pain 4/10 which he describes as cramping.  Pt also states he has hyperaldosteronism which he is being followed by endocrinology.

## 2017-06-02 NOTE — ED Provider Notes (Signed)
[facility] Emergency Department Provider Note    First MD Initiated Contact with Patient 06/02/17 Bosie Helper     (approximate)  I have reviewed the triage vital signs and the nursing notes.   HISTORY  Chief Complaint Constipation and Abdominal Pain    HPI Peter [name] is a 56 y.o. male resents with chief complaint of constipation and not moving his bowels for the past several days.  States he is also been having worsening nausea and vomiting.  Also noted some mild to moderate cramping and aching left lower quadrant abdominal pain.  Is not moving any gas.  States that if he strains really hard he can pass "a palpable ".  No blood in his stools.  He is status post open lap appendectomy.  Denies any fevers, chest pain or rashes.  No dysuria.  Is never had pain like this in the past.  Past Medical History:  Diagnosis Date  . Blood in stool   . Difficult intubation   . Glaucoma    since 2004  . Hypertension    since 2000  . Obesity, unspecified   . Ulcer 2011   gastric  . Unspecified hemorrhoids without mention of complication    Family History  Problem Relation Age of Onset  . Cancer Other        colon  . Hyperlipidemia Mother   . Hypertension Mother    Past Surgical History:  Procedure Laterality Date  . APPLICATION OF WOUND VAC N/A 11/16/2016   Procedure: APPLICATION OF WOUND VAC-ABDOMINAL;  Surgeon: Florene Glen, MD;  Location: [facility];  Service: General;  Laterality: N/A;  . APPLICATION OF WOUND VAC N/A 11/18/2016   Procedure: APPLICATION OF WOUND VAC;  Surgeon: Florene Glen, MD;  Location: [facility];  Service: General;  Laterality: N/A;  change  . APPLICATION OF WOUND VAC N/A 11/23/2016   Procedure: WOUND VAC CHANGE;  Surgeon: Olean Ree, MD;  Location: [facility];  Service: General;  Laterality: N/A;  wound vac application  . CARDIAC CATHETERIZATION Left 06/23/2016   Procedure: Left Heart Cath and Coronary Angiography;  Surgeon:  Nelva Bush, MD;  Location: [place] [facility];  Service: Cardiovascular;  Laterality: Left;  . CHOLECYSTECTOMY  1992  . COLONOSCOPY W/ POLYPECTOMY  05/22/2010   81mmtransverse colon polyp, traditional serrated adenoma,negative for high grade dysplasia & malignancy. rectal polyp-59mm,negative for dysplasia & malignancy.  Marland Kitchen DRESSING CHANGE UNDER ANESTHESIA N/A 11/21/2016   Procedure: DRESSING CHANGE UNDER ANESTHESIA;  Surgeon: Florene Glen, MD;  Location: [facility];  Service: General;  Laterality: N/A;  . KNEE SURGERY Left 2010  . LAPAROSCOPIC APPENDECTOMY N/A 10/30/2016   Procedure: APPENDECTOMY LAPAROSCOPIC changed  to open application of wound vac;  Surgeon: [name], MD;  Location: [facility];  Service: General;  Laterality: N/A;  . LAPAROTOMY N/A 11/11/2016   Procedure: EXPLORATORY LAPAROTOMY, DEBRIDEMENT OF ABDOMINAL WOUND, ABDOMINAL [place];  Surgeon: [name], MD;  Location: [facility];  Service: General;  Laterality: N/A;  . LAPAROTOMY N/A 11/12/2016   Procedure: EXPLORATORY LAPAROTOMY;  Surgeon: Clayburn Pert, MD;  Location: [facility];  Service: General;  Laterality: N/A;  . LAPAROTOMY N/A 11/14/2016   Procedure: EXPLORATORY LAPAROTOMY; Irrigation, partial closure;  Surgeon: Clayburn Pert, MD;  Location: [facility];  Service: General;  Laterality: N/A;   Patient Active Problem List   Diagnosis Date Noted  . Open abdominal incision with drainage   . 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 Necrotizing  soft tissue infection 11/11/2016  . Appendicitis, acute 10/30/2016  . Acute appendicitis with generalized peritonitis   . 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 Class 3 obesity with body mass index (BMI) of 50.0 to 59.9 in adult 09/21/2016  . Decreased libido 09/21/2016  . Shortness of breath 06/15/2016  . Abnormal stress test 06/15/2016  . 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 Avitaminosis D 03/25/2015  . Blood in stool   . Clinical depression 06/21/2011  . Cannot sleep  08/22/2009  . Hypercholesterolemia without hypertriglyceridemia 01/28/2009  . Hay fever 10/22/2008  . Adjustment disorder with mixed anxiety and depressed mood 12/12/2007  . Essential (primary) hypertension 12/12/2007  . Apnea, sleep 12/12/2007      Prior to Admission medications   Medication Sig Start Date End Date Taking? Authorizing Provider  amLODipine (NORVASC) 5 MG tablet Take 1 tablet (5 mg total) by mouth daily. Patient taking differently: Take 10 mg by mouth daily.  03/17/17   Jerrol Banana., MD  aspirin EC 81 MG tablet Take 81 mg by mouth daily.    [provider]  atorvastatin (LIPITOR) 40 MG tablet Take 1 tablet (40 mg total) by mouth daily. 04/19/17 07/18/17  Minna Merritts, MD  clonazePAM (KLONOPIN) 0.5 MG tablet Take 0.5 mg by mouth 2 (two) times daily.  01/14/17   [provider]  EQUETRO 200 MG CP12 12 hr capsule Take 400-600 mg by mouth 3 (three) times daily.  03/22/15   [provider]  glycerin adult 2 g suppository Place 1 suppository rectally as needed for constipation. 06/02/17   Merlyn Lot, MD  ibuprofen (ADVIL,MOTRIN) 200 MG tablet Take 800 mg by mouth 3 (three) times daily as needed for headache or moderate pain.    [provider]  ketoconazole (NIZORAL) 2 % cream Apply 1 application topically 2 (two) times daily. Patient not taking: Reported on 05/31/2017 02/16/17   Jerrol Banana., MD  lamoTRIgine (LAMICTAL) 200 MG tablet Take 400 mg by mouth at bedtime.     [provider]  latanoprost (XALATAN) 0.005 % ophthalmic solution Place 1 drop into both eyes at bedtime. 11/23/16   Olean Ree, MD  losartan (COZAAR) 100 MG tablet TAKE 1 TABLET BY MOUTH EVERY DAY 05/08/16   Jerrol Banana., MD  magnesium citrate SOLN Take 296 mLs (1 Bottle total) by mouth once for 1 dose. 06/02/17 06/02/17  Merlyn Lot, MD  MAGNESIUM-OXIDE 400 (241.3 Mg) MG tablet Take 400 mg by mouth 2 (two) times daily.  06/05/16   [provider]  Melatonin 3 MG TABS Take by mouth at bedtime.    [provider]  metoprolol tartrate (LOPRESSOR) 25 MG tablet Take 1 tablet (25 mg total) by mouth 3 (three) times daily. 04/27/17   Jerrol Banana., MD  Multiple Vitamin (MULTIVITAMIN) tablet Take 1 tablet by mouth daily.    [provider]  ondansetron (ZOFRAN-ODT) 8 MG disintegrating tablet Take 1 tablet (8 mg total) by mouth every 8 (eight) hours as needed for nausea or vomiting. 02/16/17   Jerrol Banana., MD  potassium chloride (K-DUR) 10 MEQ tablet Take 4 tablets (40 mEq total) by mouth daily for 4 days. 06/02/17 06/06/17  Merlyn Lot, MD  potassium chloride SA (K-DUR,KLOR-CON) 20 MEQ tablet Take 2 tablets (40 mEq total) by mouth 3 (three) times daily. Patient not taking: Reported on 03/21/2017 07/01/16   End, Harrell Gave, MD  ranitidine (ZANTAC) 300 MG tablet TAKE 1 TABLET(300 MG) BY MOUTH  TWICE DAILY 03/28/17   Jerrol Banana., MD  risperiDONE (RISPERDAL) 2 MG tablet Take 2 mg by mouth at bedtime.    [provider]  Sennosides-Docusate Sodium (SENEXON-S PO) Take by mouth 2 (two) times daily as needed.    [provider]  sertraline (ZOLOFT) 50 MG tablet Take 100 mg by mouth daily.     [provider]  spironolactone (ALDACTONE) 25 MG tablet Take 50 mg by mouth daily.     [provider]  sucralfate (CARAFATE) 1 GM/10ML suspension Take 1 g by mouth 4 (four) times daily.    [provider]  thiamine (VITAMIN B-1) 100 MG tablet Take 100 mg by mouth daily.    [provider]  Vitamin D, Ergocalciferol, (DRISDOL) 50000 units CAPS capsule Take 1 capsule (50,000 Units total) by mouth every 7 (seven) days. Patient not taking: Reported on 03/21/2017 10/13/16   Jerrol Banana., MD    Allergies Oxycodone and Isosorbide    Social History Social History   Tobacco Use  . Smoking status: Never Smoker  .  Smokeless tobacco: Never Used  Substance Use Topics  . Alcohol use: Yes    Comment: occasionally  . Drug use: No    Review of Systems Patient denies headaches, rhinorrhea, blurry vision, numbness, shortness of breath, chest pain, edema, cough, abdominal pain, nausea, vomiting, diarrhea, dysuria, fevers, rashes or hallucinations unless otherwise stated above in HPI. ____________________________________________   PHYSICAL EXAM:  VITAL SIGNS: Vitals:   06/02/17 1834 06/02/17 2130  BP: (!) 185/96 (!) 191/65  Pulse: 71 73  Resp: 20 10  Temp:    SpO2: 99% 99%    Constitutional: Alert and oriented. in no acute distress. Eyes: Conjunctivae are normal.  Head: Atraumatic. Nose: No congestion/rhinnorhea. Mouth/Throat: Mucous membranes are moist.   Neck: No stridor. Painless ROM.  Cardiovascular: Normal rate, regular rhythm. Grossly normal heart sounds.  Good peripheral circulation. Respiratory: Normal respiratory effort.  No retractions. Lungs CTAB. Gastrointestinal: There is some mild tenderness palpation left lower quadrant associated with hard mobile mass.  No peritonitis.  Hypoactive bowel sounds.. No distention. No abdominal bruits. No CVA tenderness. Genitourinary:  Musculoskeletal: No lower extremity tenderness nor edema.  No joint effusions. Neurologic:  Normal speech and language. No gross focal neurologic deficits are appreciated. No facial droop Skin:  Skin is warm, dry and intact. No rash noted. Psychiatric: Mood and affect are normal. Speech and behavior are normal.  ____________________________________________   LABS (all labs ordered are listed, but only abnormal results are displayed)  Results for orders placed or performed during the hospital encounter of 06/02/17 (from the past 24 hour(s))  Lipase, blood     Status: None   Collection Time: 06/02/17  4:47 PM  Result Value Ref Range   Lipase 41 11 - 51 U/L  Comprehensive metabolic panel     Status: Abnormal    Collection Time: 06/02/17  4:47 PM  Result Value Ref Range   Sodium 142 135 - 145 mmol/L   Potassium 2.4 (LL) 3.5 - 5.1 mmol/L   Chloride 105 101 - 111 mmol/L   CO2 26 22 - 32 mmol/L   Glucose, Bld 130 (H) 65 - 99 mg/dL   BUN 15 6 - 20 mg/dL   Creatinine, Ser 0.72 0.61 - 1.24 mg/dL   Calcium 9.1 8.9 - 10.3 mg/dL   Total Protein 7.5 6.5 - 8.1 g/dL   Albumin 3.8 3.5 - 5.0 g/dL   AST 22 15 -  41 U/L   ALT 13 (L) 17 - 63 U/L   Alkaline Phosphatase 144 (H) 38 - 126 U/L   Total Bilirubin 0.7 0.3 - 1.2 mg/dL   GFR calc non Af Amer >60 >60 mL/min   GFR calc Af Amer >60 >60 mL/min   Anion gap 11 5 - 15  CBC     Status: Abnormal   Collection Time: 06/02/17  4:47 PM  Result Value Ref Range   WBC 5.6 3.8 - 10.6 K/uL   RBC 4.72 4.40 - 5.90 MIL/uL   Hemoglobin 12.8 (L) 13.0 - 18.0 g/dL   HCT 38.1 (L) 40.0 - 52.0 %   MCV 80.6 80.0 - 100.0 fL   MCH 27.1 26.0 - 34.0 pg   MCHC 33.6 32.0 - 36.0 g/dL   RDW 15.7 (H) 11.5 - 14.5 %   Platelets 208 150 - 440 K/uL  Urinalysis, Complete w Microscopic     Status: Abnormal   Collection Time: 06/02/17  4:48 PM  Result Value Ref Range   Color, Urine YELLOW (A) YELLOW   APPearance CLEAR (A) CLEAR   Specific Gravity, Urine 1.016 1.005 - 1.030   pH 6.0 5.0 - 8.0   Glucose, UA NEGATIVE NEGATIVE mg/dL   Hgb urine dipstick NEGATIVE NEGATIVE   Bilirubin Urine NEGATIVE NEGATIVE   Ketones, ur NEGATIVE NEGATIVE mg/dL   Protein, ur 30 (A) NEGATIVE mg/dL   Nitrite NEGATIVE NEGATIVE   Leukocytes, UA NEGATIVE NEGATIVE   RBC / HPF 0-5 0 - 5 RBC/hpf   WBC, UA 0-5 0 - 5 WBC/hpf   Bacteria, UA NONE SEEN NONE SEEN   Squamous Epithelial / LPF 0-5 (A) NONE SEEN   Mucus PRESENT    ____________________________________________  EKG My review and personal interpretation at Time: 17:38   Indication: hypokalemia  Rate: 70  Rhythm: sinus Axis: normal Other: no stemi, nonspecific st changes, normal  intervals  ____________________________________________  RADIOLOGY  I personally reviewed all radiographic images ordered to evaluate for the above acute complaints and reviewed radiology reports and findings.  These findings were personally discussed with the patient.  Please see medical record for radiology report.  ____________________________________________   PROCEDURES  Procedure(s) performed:  Procedures ------------------------------------------------------------------------------------------------------------------- Fecal Disimpaction Procedure Note:  Performed by me:  Patient placed in the lateral recumbent position with knees drawn towards chest. Nurse present for patient support. Large amount of hard brown stool removed. No complications during procedure.   ------------------------------------------------------------------------------------------------------------------     Critical Care performed: no ____________________________________________   INITIAL IMPRESSION / ASSESSMENT AND PLAN / ED COURSE  Pertinent labs & imaging results that were available during my care of the patient were reviewed by me and considered in my medical decision making (see chart for details).  DDX: SBO, ileus, hernia, abscess, colitis, mass, dehydration, AK I  Peter [name] is a 56 y.o. who presents to the ED with symptoms as described above.  Blood work sent for the above differential shows no leukocytosis but he does have acute hypokalemia.  Will provide IV magnesium, IV fluids as well as oral potassium.  Will provide oral potassium after CT imaging to exclude acute intra-abdominal process.  Clinical Course as of Jun 02 2149  Thu Jun 02, 2017  2145 Rectal exam performed and disimpaction performed without complication.  Repeat abdominal exam is soft and benign.  Patient stable and appropriate for follow-up with PCP.  [PR]    Clinical Course User Index [PR] Merlyn Lot, MD  ____________________________________________   FINAL CLINICAL IMPRESSION(S) / ED DIAGNOSES  Final diagnoses:  Left lower quadrant pain  Constipation, unspecified constipation type  Fecal impaction in rectum (HCC)  Hypokalemia      NEW MEDICATIONS STARTED DURING THIS VISIT:  This SmartLink is deprecated. Use AVSMEDLIST instead to display the medication list for a patient.   Note:  This document was prepared using Dragon voice recognition software and may include unintentional dictation errors.    Merlyn Lot, MD 06/02/17 2150

## 2017-06-02 NOTE — Telephone Encounter (Signed)
FYI Patient's wife called to say he was still constipated and had a little vomiting last night and again this morning.  He is now having abdominal pain and feels terrible.  He has been on the Colace since being in the office and his wife got him some Miralax today.  Due to the pain, vomiting and now 5 days without a BM, along with his history of extensive abdominal surgery he was instructed to go to the ER.  I informed that wife that with his history the concern would be an obstruction and for that he would need to be at a minimum monitored at the hospital.  She understood and agreed and was going to take him to the ER after hanging up.

## 2017-06-05 ENCOUNTER — Other Ambulatory Visit: Payer: Self-pay | Admitting: Family Medicine

## 2017-06-06 ENCOUNTER — Ambulatory Visit: Payer: BLUE CROSS/BLUE SHIELD | Attending: Family Medicine

## 2017-06-06 VITALS — BP 150/81 | HR 72

## 2017-06-06 DIAGNOSIS — R2689 Other abnormalities of gait and mobility: Secondary | ICD-10-CM | POA: Insufficient documentation

## 2017-06-06 DIAGNOSIS — F3175 Bipolar disorder, in partial remission, most recent episode depressed: Secondary | ICD-10-CM | POA: Diagnosis not present

## 2017-06-06 DIAGNOSIS — M6281 Muscle weakness (generalized): Secondary | ICD-10-CM | POA: Diagnosis not present

## 2017-06-06 DIAGNOSIS — I10 Essential (primary) hypertension: Secondary | ICD-10-CM | POA: Diagnosis not present

## 2017-06-06 NOTE — Therapy (Signed)
[place] MAIN [facility] SERVICES [address] [place], [place], [address] Phone: [phone]   Fax:  [phone]  Physical Therapy Treatment  Patient Details  Name: Peter [name] MRN: [id number] Date of Birth: 1960/08/24 Referring Provider: Dr. Thurston Hole   Encounter Date: 06/06/2017  PT End of Session - 06/06/17 1021    Visit Number  5    Number of Visits  7    Date for PT Re-Evaluation  07/04/17    Authorization Type  5/7 visits    PT Start Time  1030    PT Stop Time  1115    PT Time Calculation (min)  45 min    Equipment Utilized During Treatment  Gait belt    Activity Tolerance  Patient tolerated treatment well;Patient limited by pain;Patient limited by fatigue    Behavior During Therapy  [facility] for tasks assessed/performed       Past Medical History:  Diagnosis Date  . Blood in stool   . Difficult intubation   . Glaucoma    since 2004  . Hypertension    since 2000  . Obesity, unspecified   . Ulcer 2011   gastric  . Unspecified hemorrhoids without mention of complication     Past Surgical History:  Procedure Laterality Date  . APPLICATION OF WOUND VAC N/A 11/16/2016   Procedure: APPLICATION OF WOUND VAC-ABDOMINAL;  Surgeon: Florene Glen, MD;  Location: [facility] ORS;  Service: General;  Laterality: N/A;  . APPLICATION OF WOUND VAC N/A 11/18/2016   Procedure: APPLICATION OF WOUND VAC;  Surgeon: Florene Glen, MD;  Location: [facility] OR[facility];  Service: General;  Laterality: N/A;  change  . APPLICATION OF WOUND VAC N/A 11/23/2016   Procedure: WOUND VAC CHANGE;  Surgeon: Olean Ree, MD;  Location: [facility] ORS;  Service: General;  Laterality: N/A;  wound vac application  . CARDIAC CATHETERIZATION Left 06/23/2016   Procedure: Left Heart Cath and Coronary Angiography;  Surgeon: Nelva Bush, MD;  Location: [place] [facility];  Service: Cardiovascular;  Laterality: Left;  . CHOLECYSTECTOMY  1992  . COLONOSCOPY W/ POLYPECTOMY   05/22/2010   25mmtransverse colon polyp, traditional serrated adenoma,negative for high grade dysplasia & malignancy. rectal polyp-45mm,negative for dysplasia & malignancy.  Marland Kitchen DRESSING CHANGE UNDER ANESTHESIA N/A 11/21/2016   Procedure: DRESSING CHANGE UNDER ANESTHESIA;  Surgeon: Florene Glen, MD;  Location: [facility] ORS;  Service: General;  Laterality: N/A;  . KNEE SURGERY Left 2010  . LAPAROSCOPIC APPENDECTOMY N/A 10/30/2016   Procedure: APPENDECTOMY LAPAROSCOPIC changed  to open application of wound vac;  Surgeon: [name], MD;  Location: [facility] OR[facility];  Service: General;  Laterality: N/A;  . LAPAROTOMY N/A 11/11/2016   Procedure: EXPLORATORY LAPAROTOMY, DEBRIDEMENT OF ABDOMINAL WOUND, ABDOMINAL [place];  Surgeon: [name], MD;  Location: [facility] OR[facility];  Service: General;  Laterality: N/A;  . LAPAROTOMY N/A 11/12/2016   Procedure: EXPLORATORY LAPAROTOMY;  Surgeon: Clayburn Pert, MD;  Location: [facility] OR[facility];  Service: General;  Laterality: N/A;  . LAPAROTOMY N/A 11/14/2016   Procedure: EXPLORATORY LAPAROTOMY; Irrigation, partial closure;  Surgeon: Clayburn Pert, MD;  Location: [facility] OR[facility];  Service: General;  Laterality: N/A;    Vitals:   06/06/17 1037  BP: (!) 150/81  Pulse: 72     150/81 pulse 72 Subjective Assessment - 06/06/17 1030    Subjective  Pt. had compacted bowel and went to the ED, was occuring 5 days. was at ED for 7 hours on thursday from 4-11. Had  a CT scan. Dr. Rosanna Randy gave his medication as a gentle laxitive the tuesday before. Excessive abdominal pain . Now having excessive diahrea. Very fatigued , hasn't slept well.      Patient is accompained by:  Family member    Pertinent History  Patient is a pleasant 56 year old male who presents to physical therapy for hyperaldosternoism related weakness. Patient having weakness after appendix surgery on April 28th. Feels like lost two months of time, regained memory in June. Was in the hospital (three hospitals, one long term rehab,  and one rehab) for three and a half month. Had most of PT and OT at [facility], then had home healthcare PT and OT. Main problem is left leg, is tingling in it, not able to put full weight onto it. Tight L piriformis.     Limitations  Lifting;Standing;Walking;House hold activities;Other (comment)    How long can you sit comfortably?  n/a    How long can you stand comfortably?  with walker ok, challenged without walker. couple minutes with walker    How long can you walk comfortably?  1000 ft     Patient Stated Goals  walking more, stronger and more secure with left leg    Currently in Pain?  Yes    Pain Score  3     Pain Location  Abdomen    Pain Orientation  Anterior    Pain Descriptors / Indicators  Aching    Pain Type  Acute pain    Pain Onset  1 to 4 weeks ago       TherEx Weight shift left to right in // bars Weight shift with right foot elevated for increased weight shift to left Backwards walking in // bars with decreased UE support, cues for larger step length  Forward lunges onto bosu ball decreasing to no UE support 15x each leg, LLE weakness prominent with decreased ability to aim and maintain SLS.  Side steps onto bosu ball requiring finger tip support, requires more support from UE's when on L SLS.    Seated LAQ with 5 second holds 10x each leg  Resisted knee flexion GTB 15x each leg  Resisted PF GTB  15x  Resisted DF GTB 15x  Side step/abduction with GTB around ankles 20x each leg      Pt. response to medical necessity:  Patient will continue to benefit from skilled physical therapy to improve mobility and strength for return to previous level of function                        PT Education - 06/06/17 1020    Education provided  Yes    Education Details  funcitonal strength and weight shifting    Person(s) Educated  Patient    Methods  Explanation;Demonstration;Verbal cues    Comprehension  Verbalized understanding;Returned demonstration        PT Short Term Goals - 05/09/17 1605      PT SHORT TERM GOAL #1   Title   Patient (< 14 years old) will complete five times sit to stand test in < 14 seconds indicating an increased LE strength and improved balance    Baseline  11/5: 17 seconds    Time  2    Period  Weeks    Status  New    Target Date  05/23/17      PT SHORT TERM GOAL #2   Title  Patient will be independent in home  exercise program to improve strength/mobility for better functional independence with ADLs.    Baseline  hep given    Time  2    Period  Weeks    Status  New    Target Date  05/23/17      PT SHORT TERM GOAL #3   Title  Patient will deny any falls over past 4 weeks to demonstrate improved safety awareness at home and work    Baseline  one fall     Time  2    Period  Weeks    Status  New    Target Date  05/23/17        PT Long Term Goals - 05/09/17 1709      PT LONG TERM GOAL #1   Title  Patient (< 25 years old) will complete five times sit to stand test in < 10 seconds indicating an increased LE strength and improved balance.    Baseline  11/5: 17 seconds    Time  8    Period  Weeks    Status  New    Target Date  07/04/17      PT LONG TERM GOAL #2   Title  Patient will increase 10 meter walk test to >1.43m/s as to improve gait speed for better community ambulation and to reduce fall risk.    Baseline  11/5: .83 m/s    Time  8    Period  Weeks    Status  New    Target Date  07/04/17      PT LONG TERM GOAL #3   Title  Patient will reduce timed up and go to <11 seconds to reduce fall risk and demonstrate improved transfer/gait ability.    Baseline  11/5: 14    Time  8    Period  Weeks    Status  New    Target Date  07/04/17      PT LONG TERM GOAL #4   Title  Patient will increase lower extremity functional scale to >60/80 to demonstrate improved functional mobility and increased tolerance with ADLs.     Baseline  05/09/17: 28/80    Time  8    Period  Weeks    Status  New     Target Date  07/04/17            Plan - 06/06/17 1127    Clinical Impression Statement  Patient demonstrates protective stance and compensatory patterns for abdominal region due to recent increase in pain and ER visit for impactment. LLE weakness is noted with pt's difficulty maintaining equal weight bearing upon it and single limb stance.  Patient will continue to benefit from skilled physical therapy to improve mobility and strength for return to previous level of function    Rehab Potential  Fair    Clinical Impairments Affecting Rehab Potential  long duration of hospitalization, hyperaldosternism, high blood pressure, (+) family support    PT Frequency  1x / week    PT Duration  8 weeks    PT Treatment/Interventions  Electrical Stimulation;Moist Heat;Traction;Ultrasound;Biofeedback;Therapeutic exercise;Therapeutic activities;Functional mobility training;Stair training;Gait training;DME Instruction;Balance training;Neuromuscular re-education;Patient/family education;Manual techniques;Passive range of motion;Energy conservation;Taping;Visual/perceptual remediation/compensation    PT Next Visit Plan  gym program     PT Home Exercise Plan  see above    Consulted and Agree with Plan of Care  Patient;Family member/caregiver    Family Member Consulted  wife       Patient will benefit from skilled  therapeutic intervention in order to improve the following deficits and impairments:  Abnormal gait, Decreased activity tolerance, Decreased balance, Decreased endurance, Decreased mobility, Decreased range of motion, Decreased safety awareness, Difficulty walking, Decreased strength, Hypomobility, Impaired flexibility, Impaired perceived functional ability, Increased muscle spasms, Impaired sensation, Postural dysfunction, Improper body mechanics, Pain  Visit Diagnosis: Muscle weakness (generalized)  Other abnormalities of gait and mobility     Problem List Patient Active Problem List    Diagnosis Date Noted  . Open abdominal incision with drainage   . 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 Necrotizing soft tissue infection 11/11/2016  . Appendicitis, acute 10/30/2016  . Acute appendicitis with generalized peritonitis   . 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 Class 3 obesity with body mass index (BMI) of 50.0 to 59.9 in adult 09/21/2016  . Decreased libido 09/21/2016  . Shortness of breath 06/15/2016  . Abnormal stress test 06/15/2016  . 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 Avitaminosis D 03/25/2015  . Blood in stool   . Clinical depression 06/21/2011  . Cannot sleep 08/22/2009  . Hypercholesterolemia without hypertriglyceridemia 01/28/2009  . Hay fever 10/22/2008  . Adjustment disorder with mixed anxiety and depressed mood 12/12/2007  . Essential (primary) hypertension 12/12/2007  . Apnea, sleep 12/12/2007   Janna Arch, PT, DPT   Janna Arch 06/06/2017, 11:27 AM  [place] MAIN [facility] SERVICES [address] [place], [place], [address] Phone: [phone]   Fax:  [phone]  Name: Peter [name] MRN: [id number] Date of Birth: 1960-08-18

## 2017-06-08 ENCOUNTER — Telehealth: Payer: Self-pay

## 2017-06-08 MED ORDER — POTASSIUM CHLORIDE ER 10 MEQ PO TBCR: 10.0000 meq | EXTENDED_RELEASE_TABLET | Freq: Four times a day (QID) | ORAL | 0 refills | Status: DC

## 2017-06-08 NOTE — Telephone Encounter (Signed)
-----   Message from Jerrol Banana., MD sent at 06/07/2017  1:20 PM EST ----- K too low--start Kcl 56mEq--4 daily--recheck K 1-2 weeks.

## 2017-06-08 NOTE — Telephone Encounter (Signed)
Patient advised. RX sent to [facility]. Patient will call back in 1-2 weeks to request a lab slip to have potassium levels rechecked.

## 2017-06-10 DIAGNOSIS — I10 Essential (primary) hypertension: Secondary | ICD-10-CM | POA: Diagnosis not present

## 2017-06-13 ENCOUNTER — Ambulatory Visit: Payer: 59

## 2017-06-14 IMAGING — DX DG ABD PORTABLE 1V
3 series · 3 of 3 positions shown · non-contrast
Comparison: 11/11/2016.  CT 11/11/2016.

CLINICAL DATA: Open abdominal incision with drainage.

EXAM:
PORTABLE ABDOMEN - 1 VIEW

[abdomen kub (1 of 3)]
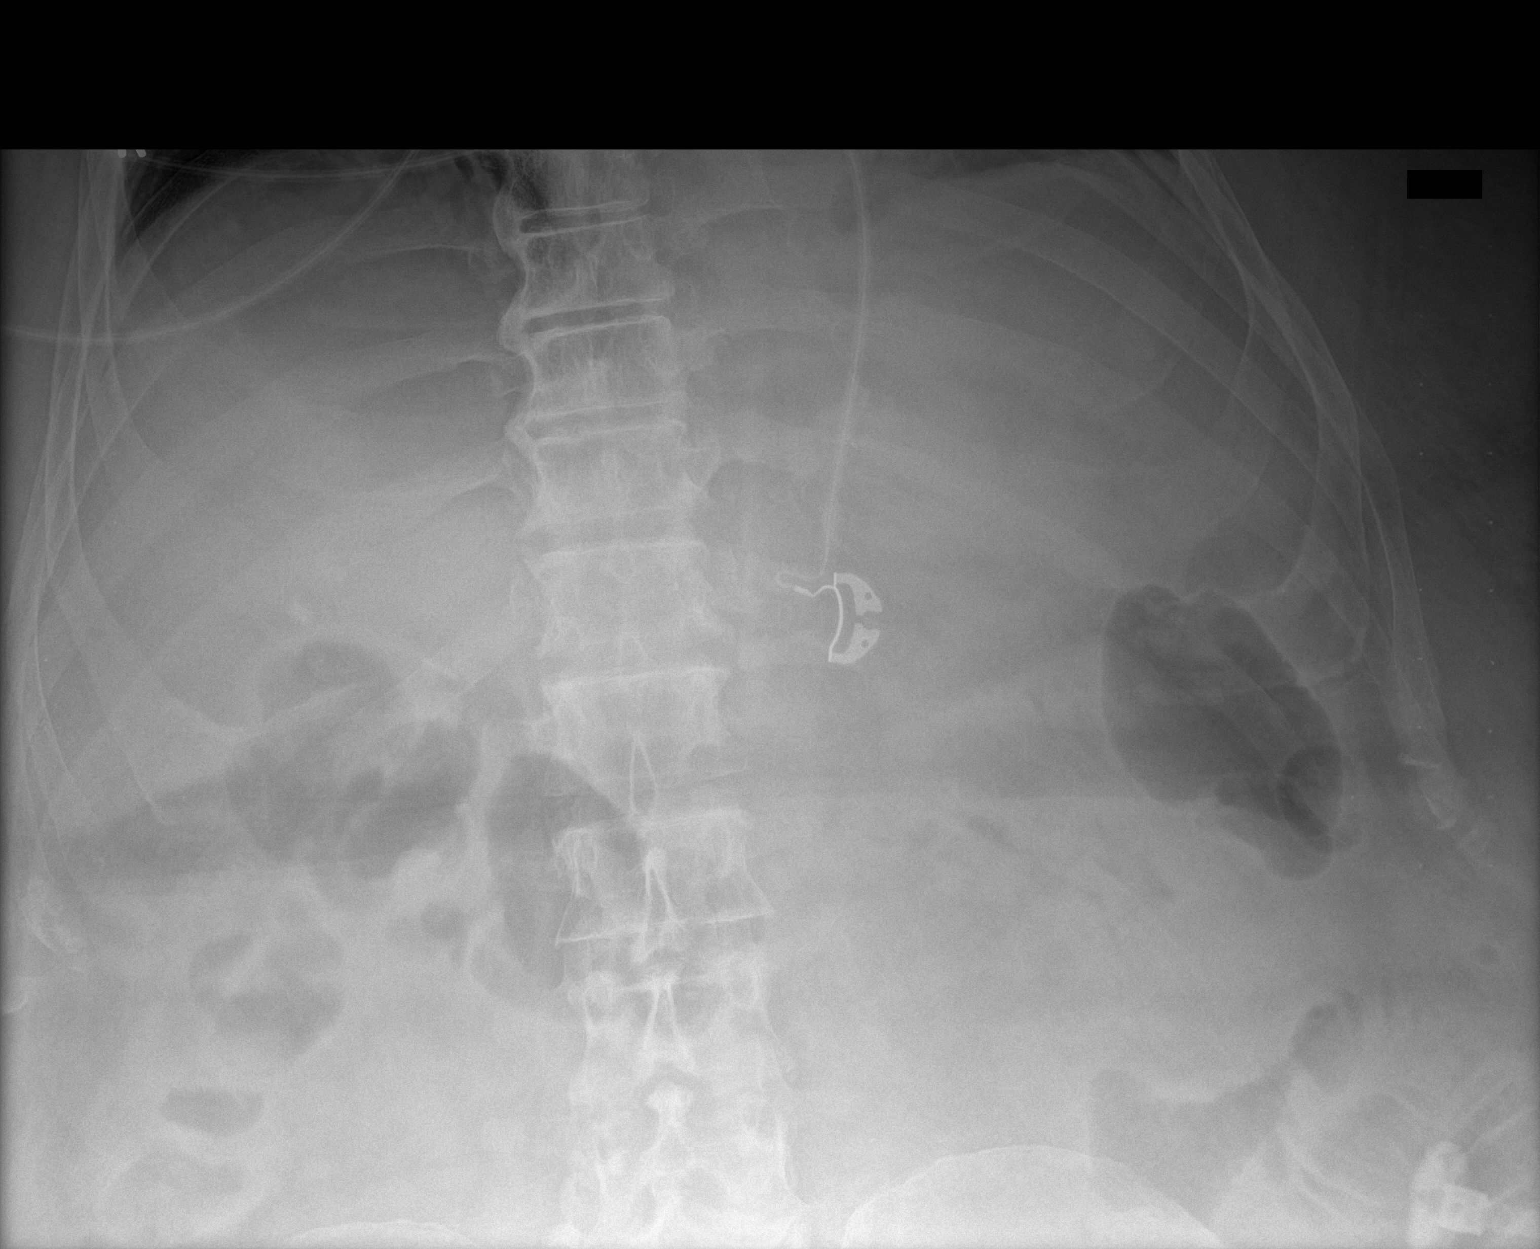

[abdomen kub (2 of 3)]
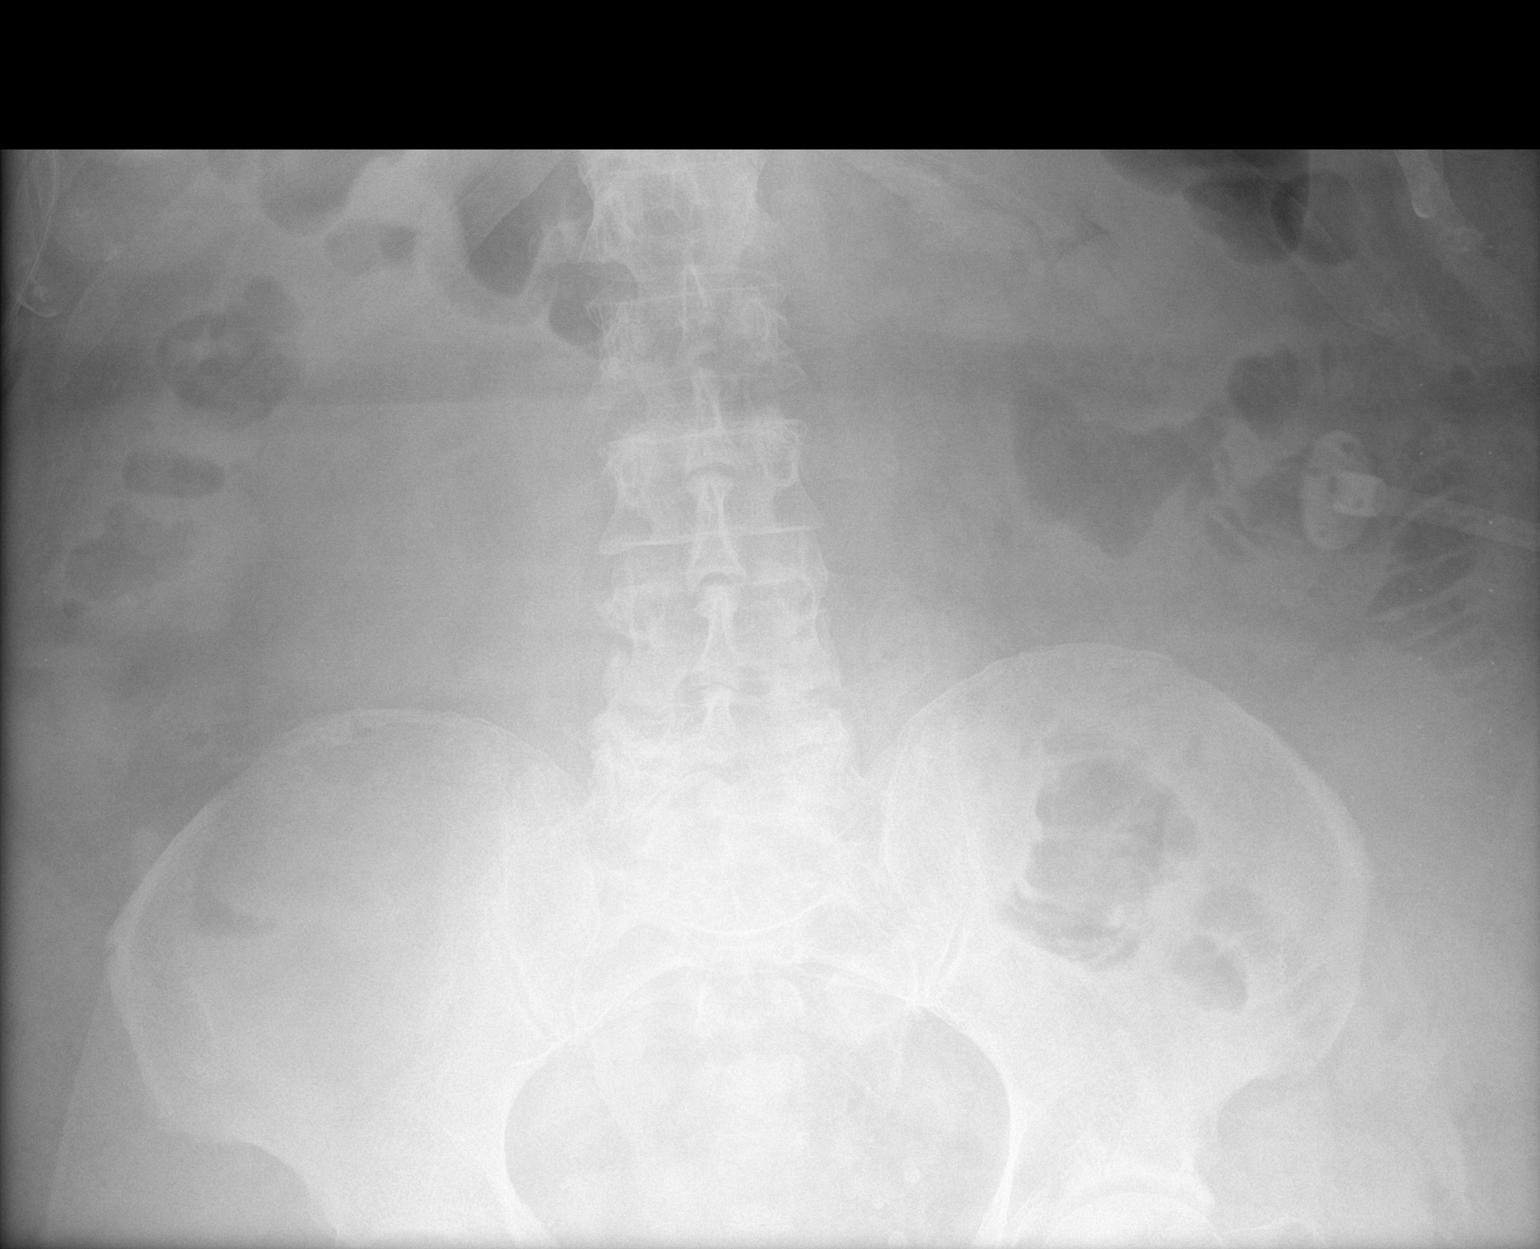

[abdomen kub (3 of 3)]
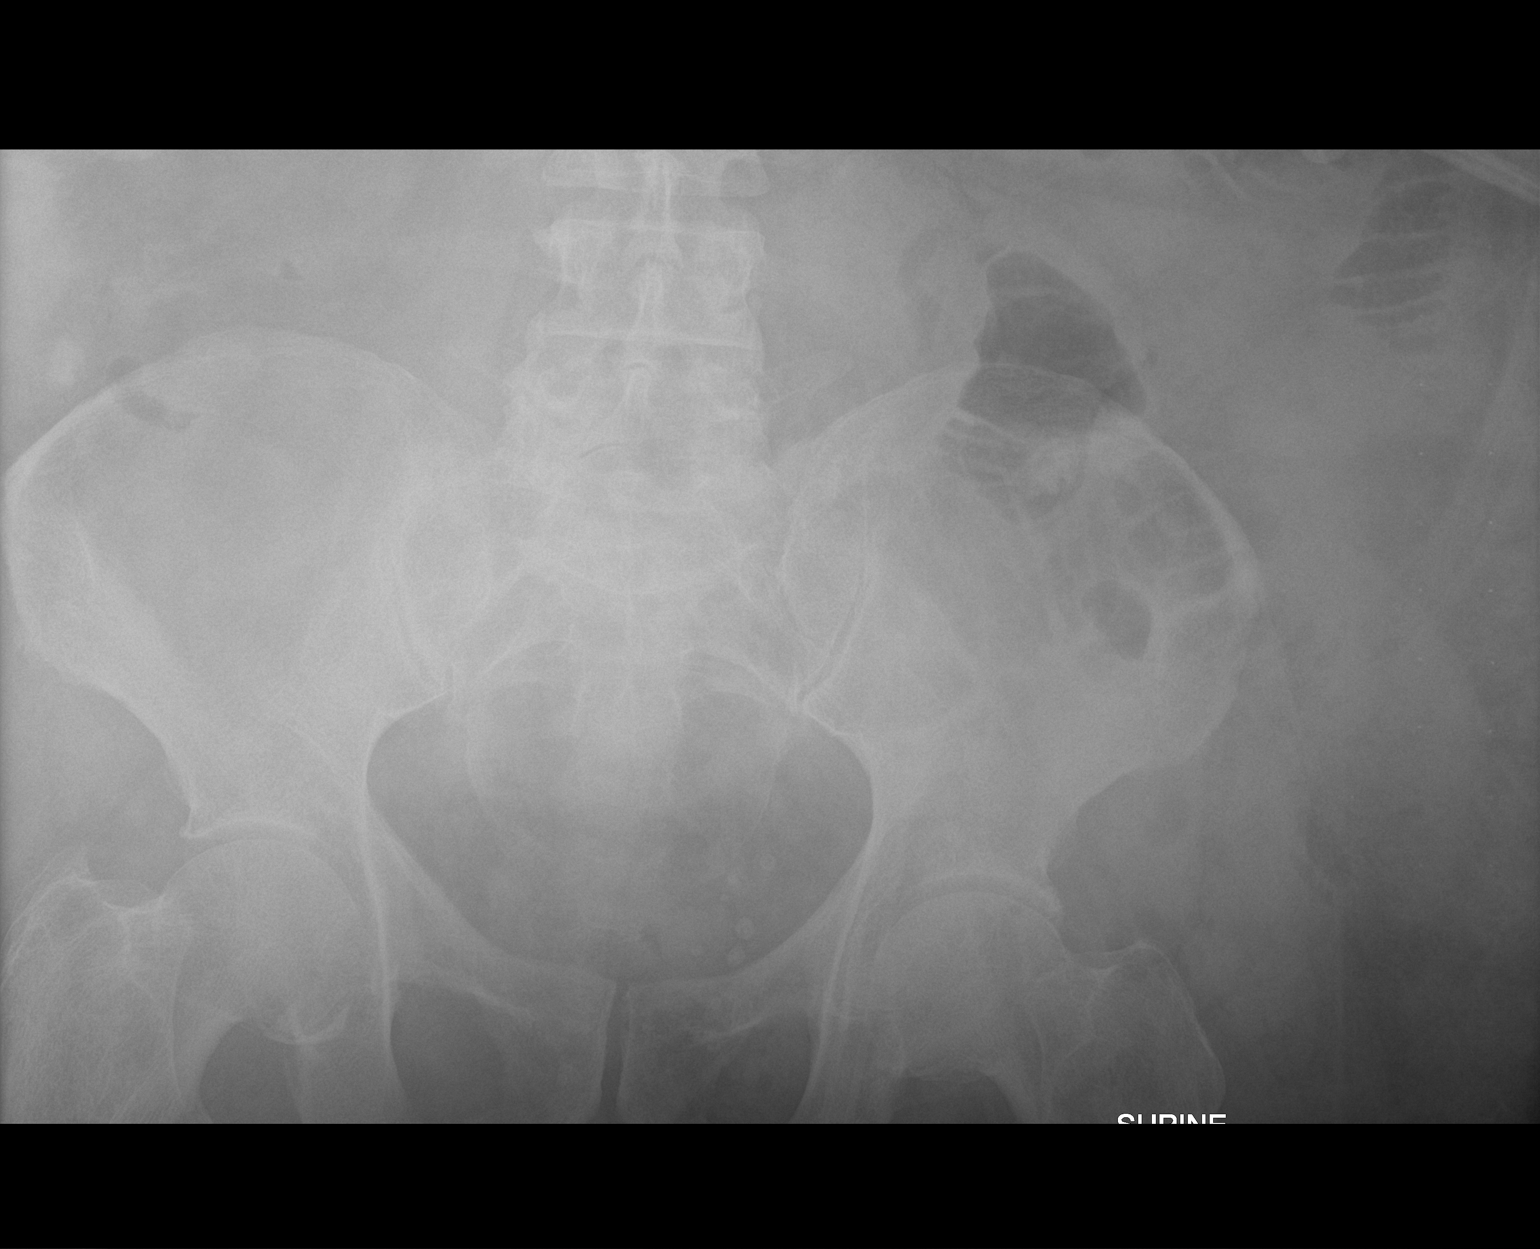

[3 of 3 positions shown; findings below may reference images not displayed]

FINDINGS: Interim removal of NG tube. Surgical clips right upper quadrant.
Soft tissue structures are unremarkable. Interim improvement of
bowel distention. No free air. Degenerative changes lumbar spine and
both hips. Pelvic calcifications consistent phleboliths. Basilar
subsegmental atelectasis.
IMPRESSION: Interim removal of NG tube. Interim improvement of bowel distention.
No free air.

## 2017-06-15 ENCOUNTER — Ambulatory Visit: Payer: 59

## 2017-06-20 ENCOUNTER — Ambulatory Visit: Payer: BLUE CROSS/BLUE SHIELD

## 2017-06-20 DIAGNOSIS — R2689 Other abnormalities of gait and mobility: Secondary | ICD-10-CM

## 2017-06-20 DIAGNOSIS — M6281 Muscle weakness (generalized): Secondary | ICD-10-CM

## 2017-06-20 DIAGNOSIS — F3175 Bipolar disorder, in partial remission, most recent episode depressed: Secondary | ICD-10-CM | POA: Diagnosis not present

## 2017-06-20 NOTE — Therapy (Signed)
[place] MAIN [facility] SERVICES [address] [place], [place], [address] Phone: [phone]   Fax:  [phone]  Physical Therapy Treatment  Patient Details  Name: Peter [name] MRN: [id number] Date of Birth: [birth year] Referring Provider: Dr. Thurston Hole   Encounter Date: 06/20/2017  PT End of Session - 06/20/17 1027    Visit Number  6    Number of Visits  7    Date for PT Re-Evaluation  07/04/17    Authorization Type  6/7 visits    PT Start Time  1029    PT Stop Time  1115    PT Time Calculation (min)  46 min    Equipment Utilized During Treatment  Gait belt    Activity Tolerance  Patient tolerated treatment well;Patient limited by pain;Patient limited by fatigue    Behavior During Therapy  [facility] for tasks assessed/performed       Past Medical History:  Diagnosis Date  . Blood in stool   . Difficult intubation   . Glaucoma    since 2004  . Hypertension    since 2000  . Obesity, unspecified   . Ulcer 2011   gastric  . Unspecified hemorrhoids without mention of complication     Past Surgical History:  Procedure Laterality Date  . APPLICATION OF WOUND VAC N/A 11/16/2016   Procedure: APPLICATION OF WOUND VAC-ABDOMINAL;  Surgeon: Florene Glen, MD;  Location: [facility];  Service: General;  Laterality: N/A;  . APPLICATION OF WOUND VAC N/A 11/18/2016   Procedure: APPLICATION OF WOUND VAC;  Surgeon: Florene Glen, MD;  Location: [facility];  Service: General;  Laterality: N/A;  change  . APPLICATION OF WOUND VAC N/A 11/23/2016   Procedure: WOUND VAC CHANGE;  Surgeon: Olean Ree, MD;  Location: [facility] OR[facility];  Service: General;  Laterality: N/A;  wound vac application  . CARDIAC CATHETERIZATION Left 06/23/2016   Procedure: Left Heart Cath and Coronary Angiography;  Surgeon: Nelva Bush, MD;  Location: [place] [facility];  Service: Cardiovascular;  Laterality: Left;  . CHOLECYSTECTOMY  1992  . COLONOSCOPY W/ POLYPECTOMY   05/22/2010   41mmtransverse colon polyp, traditional serrated adenoma,negative for high grade dysplasia & malignancy. rectal polyp-49mm,negative for dysplasia & malignancy.  Marland Kitchen DRESSING CHANGE UNDER ANESTHESIA N/A 11/21/2016   Procedure: DRESSING CHANGE UNDER ANESTHESIA;  Surgeon: Florene Glen, MD;  Location: [facility];  Service: General;  Laterality: N/A;  . KNEE SURGERY Left 2010  . LAPAROSCOPIC APPENDECTOMY N/A 10/30/2016   Procedure: APPENDECTOMY LAPAROSCOPIC changed  to open application of wound vac;  Surgeon: [name], MD;  Location: [facility];  Service: General;  Laterality: N/A;  . LAPAROTOMY N/A 11/11/2016   Procedure: EXPLORATORY LAPAROTOMY, DEBRIDEMENT OF ABDOMINAL WOUND, ABDOMINAL [place];  Surgeon: [name], MD;  Location: [facility];  Service: General;  Laterality: N/A;  . LAPAROTOMY N/A 11/12/2016   Procedure: EXPLORATORY LAPAROTOMY;  Surgeon: Clayburn Pert, MD;  Location: [facility];  Service: General;  Laterality: N/A;  . LAPAROTOMY N/A 11/14/2016   Procedure: EXPLORATORY LAPAROTOMY; Irrigation, partial closure;  Surgeon: Clayburn Pert, MD;  Location: [facility];  Service: General;  Laterality: N/A;    There were no vitals filed for this visit.  Subjective Assessment - 06/20/17 1033    Subjective  Patient compliant with inside HEP. Not going outside very often due to bad weather. Ready and accepting of gym HEP.     Patient is accompained by:  Family member  Pertinent History  Patient is a pleasant 56 year old male who presents to physical therapy for hyperaldosternoism related weakness. Patient having weakness after appendix surgery on April 28th. Feels like lost two months of time, regained memory in June. Was in the hospital (three hospitals, one long term rehab, and one rehab) for three and a half month. Had most of PT and OT at [facility], then had home healthcare PT and OT. Main problem is left leg, is tingling in it, not able to put full weight onto it. Tight L  piriformis.     Limitations  Lifting;Standing;Walking;House hold activities;Other (comment)    How long can you sit comfortably?  n/a    How long can you stand comfortably?  with walker ok, challenged without walker. couple minutes with walker    How long can you walk comfortably?  1000 ft     Patient Stated Goals  walking more, stronger and more secure with left leg    Currently in Pain?  No/denies       4 minutes nustep  Leg press x15 Moderate weight (want numbers 13, 14, and 15 to be more difficult); 60 lb Less press using single leg x15 perform with both legs (moderate weight) 30 lb Lap around the gym two times Hamstring curl machine both legs 2x12 , #4 plate Hamstring curl machine push down with both legs, let machine back up with only one leg 2x12, perform both legs, should use easy weight as will fatigue quickly #2 weight Quad extension machine pushup with both legs: 2x10 #2 plate Quad extension machine pushup both legs, let machine back down one leg, 2x 10 each leg #2 plate.                          PT Education - 06/20/17 1034    Education provided  Yes    Education Details  gym HEP program     Person(s) Educated  Patient;Spouse    Methods  Explanation;Demonstration;Verbal cues    Comprehension  Verbalized understanding;Returned demonstration       PT Short Term Goals - 05/09/17 1605      PT SHORT TERM GOAL #1   Title   Patient (< 93 years old) will complete five times sit to stand test in < 14 seconds indicating an increased LE strength and improved balance    Baseline  11/5: 17 seconds    Time  2    Period  Weeks    Status  New    Target Date  05/23/17      PT SHORT TERM GOAL #2   Title  Patient will be independent in home exercise program to improve strength/mobility for better functional independence with ADLs.    Baseline  hep given    Time  2    Period  Weeks    Status  New    Target Date  05/23/17      PT SHORT TERM GOAL #3   Title   Patient will deny any falls over past 4 weeks to demonstrate improved safety awareness at home and work    Baseline  one fall     Time  2    Period  Weeks    Status  New    Target Date  05/23/17        PT Long Term Goals - 05/09/17 1709      PT LONG TERM GOAL #1   Title  Patient (<  12 years old) will complete five times sit to stand test in < 10 seconds indicating an increased LE strength and improved balance.    Baseline  11/5: 17 seconds    Time  8    Period  Weeks    Status  New    Target Date  07/04/17      PT LONG TERM GOAL #2   Title  Patient will increase 10 meter walk test to >1.70m/s as to improve gait speed for better community ambulation and to reduce fall risk.    Baseline  11/5: .83 m/s    Time  8    Period  Weeks    Status  New    Target Date  07/04/17      PT LONG TERM GOAL #3   Title  Patient will reduce timed up and go to <11 seconds to reduce fall risk and demonstrate improved transfer/gait ability.    Baseline  11/5: 14    Time  8    Period  Weeks    Status  New    Target Date  07/04/17      PT LONG TERM GOAL #4   Title  Patient will increase lower extremity functional scale to >60/80 to demonstrate improved functional mobility and increased tolerance with ADLs.     Baseline  05/09/17: 28/80    Time  8    Period  Weeks    Status  New    Target Date  07/04/17            Plan - 06/20/17 1116    Clinical Impression Statement  Patient and wife demonstrated understanding of positioning and sequencing of new gym HEP to progress patient towards more independent program. Patient fatigued quickly throughout session and required rest breaks to restore strength. Patient will continue to benefit from skilled physical therapy to improve mobility and strength for return to previous level of function.     Rehab Potential  Fair    Clinical Impairments Affecting Rehab Potential  long duration of hospitalization, hyperaldosternism, high blood pressure, (+) family  support    PT Frequency  1x / week    PT Duration  8 weeks    PT Treatment/Interventions  Electrical Stimulation;Moist Heat;Traction;Ultrasound;Biofeedback;Therapeutic exercise;Therapeutic activities;Functional mobility training;Stair training;Gait training;DME Instruction;Balance training;Neuromuscular re-education;Patient/family education;Manual techniques;Passive range of motion;Energy conservation;Taping;Visual/perceptual remediation/compensation    PT Next Visit Plan  gym program     PT Home Exercise Plan  see above    Consulted and Agree with Plan of Care  Patient;Family member/caregiver    Family Member Consulted  wife       Patient will benefit from skilled therapeutic intervention in order to improve the following deficits and impairments:  Abnormal gait, Decreased activity tolerance, Decreased balance, Decreased endurance, Decreased mobility, Decreased range of motion, Decreased safety awareness, Difficulty walking, Decreased strength, Hypomobility, Impaired flexibility, Impaired perceived functional ability, Increased muscle spasms, Impaired sensation, Postural dysfunction, Improper body mechanics, Pain  Visit Diagnosis: Muscle weakness (generalized)  Other abnormalities of gait and mobility     Problem List Patient Active Problem List   Diagnosis Date Noted  . Open abdominal incision with drainage   . 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 Necrotizing soft tissue infection 11/11/2016  . Appendicitis, acute 10/30/2016  . Acute appendicitis with generalized peritonitis   . Hyperaldosteronism ([place]) 09/21/2016  . Class 3 obesity with body mass index (BMI) of 50.0 to 59.9 in adult 09/21/2016  . Decreased libido 09/21/2016  . Shortness  of breath 06/15/2016  . Abnormal stress test 06/15/2016  . 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 Avitaminosis D 03/25/2015  . Blood in stool   . Clinical depression 06/21/2011  . Cannot sleep 08/22/2009  . Hypercholesterolemia without  hypertriglyceridemia 01/28/2009  . Hay fever 10/22/2008  . Adjustment disorder with mixed anxiety and depressed mood 12/12/2007  . Essential (primary) hypertension 12/12/2007  . Apnea, sleep 12/12/2007   Janna Arch, PT, DPT   Janna Arch 06/20/2017, 11:16 AM  [place] MAIN [facility] SERVICES [address] [place], [place], [address] Phone: [phone]   Fax:  [phone]  Name: RUSTON FEDORA MRN: [id number] Date of Birth: Jan 14, 1961

## 2017-06-20 NOTE — Patient Instructions (Signed)
Workout 1:  10-15 minutes ski machine  Leg press 2x15 Moderate weight (want numbers 13, 14, and 15 to be more difficult) Less press using single leg 2x15 perform with both legs (moderate weight) Lap around the gym two times Hamstring curl machine both legs 2x12  Hamstring curl machine push down with both legs, let machine back up with only one leg 2x12, perform both legs, should use easy weight as will fatigue quickly 10 minutes elliptical bike or recumbent bike STRETCH Workout 2: 10-15 minutes ski machine Leg extension machine 2x12 moderate weight using both legs Leg extension machine pressing up with both legs and bringing back down with only one leg 2x 12 each leg use an easier weight Lap around the gym two times Calf press/extension machine 2x15 10 minutes elliptical bike or recumbent bike [employer] 3 ( more cardio based) 10-15 minute ski machine Standing marches with hand touching something for support 20x Lap gym 2x Leg press 2x20 light weight Lap gym 2x 10 minutes recumbent bike Workout 4: (cardio) 10 minutes recumbent bike Lap around the gym  10 minutes elliptical  Lap around the gym 10 minutes ski machine  Lap around the gym  STRETCH

## 2017-06-21 ENCOUNTER — Other Ambulatory Visit: Payer: Self-pay | Admitting: Emergency Medicine

## 2017-06-21 DIAGNOSIS — Z79899 Other long term (current) drug therapy: Secondary | ICD-10-CM | POA: Diagnosis not present

## 2017-06-21 DIAGNOSIS — E876 Hypokalemia: Secondary | ICD-10-CM

## 2017-06-21 LAB — COMPLETE METABOLIC PANEL WITH GFR
AG RATIO: 1.2 (calc) (ref 1.0–2.5)
ALBUMIN MSPROF: 3.7 g/dL (ref 3.6–5.1)
ALT: 14 U/L (ref 9–46)
AST: 17 U/L (ref 10–35)
Alkaline phosphatase (APISO): 143 U/L — ABNORMAL HIGH (ref 40–115)
BUN: 20 mg/dL (ref 7–25)
CALCIUM: 8.9 mg/dL (ref 8.6–10.3)
CO2: 28 mmol/L (ref 20–32)
Chloride: 108 mmol/L (ref 98–110)
Creat: 0.83 mg/dL (ref 0.70–1.33)
GFR, EST AFRICAN AMERICAN: 112 mL/min/{1.73_m2} (ref >=60)
GFR, EST NON AFRICAN AMERICAN: 97 mL/min/{1.73_m2} (ref >=60)
GLOBULIN: 3.1 g/dL (ref 1.9–3.7)
Glucose, Bld: 92 mg/dL (ref 65–99)
POTASSIUM: 3.6 mmol/L (ref 3.5–5.3)
SODIUM: 142 mmol/L (ref 135–146)
TOTAL PROTEIN: 6.8 g/dL (ref 6.1–8.1)
Total Bilirubin: 0.5 mg/dL (ref 0.2–1.2)

## 2017-06-21 IMAGING — DX DG ABDOMEN 1V
3 series · 3 of 3 positions shown · non-contrast
Comparison: 11/19/2016

CLINICAL DATA: Abdominal pain.

EXAM:
ABDOMEN - 1 VIEW

[abdomen kub (1 of 3)]
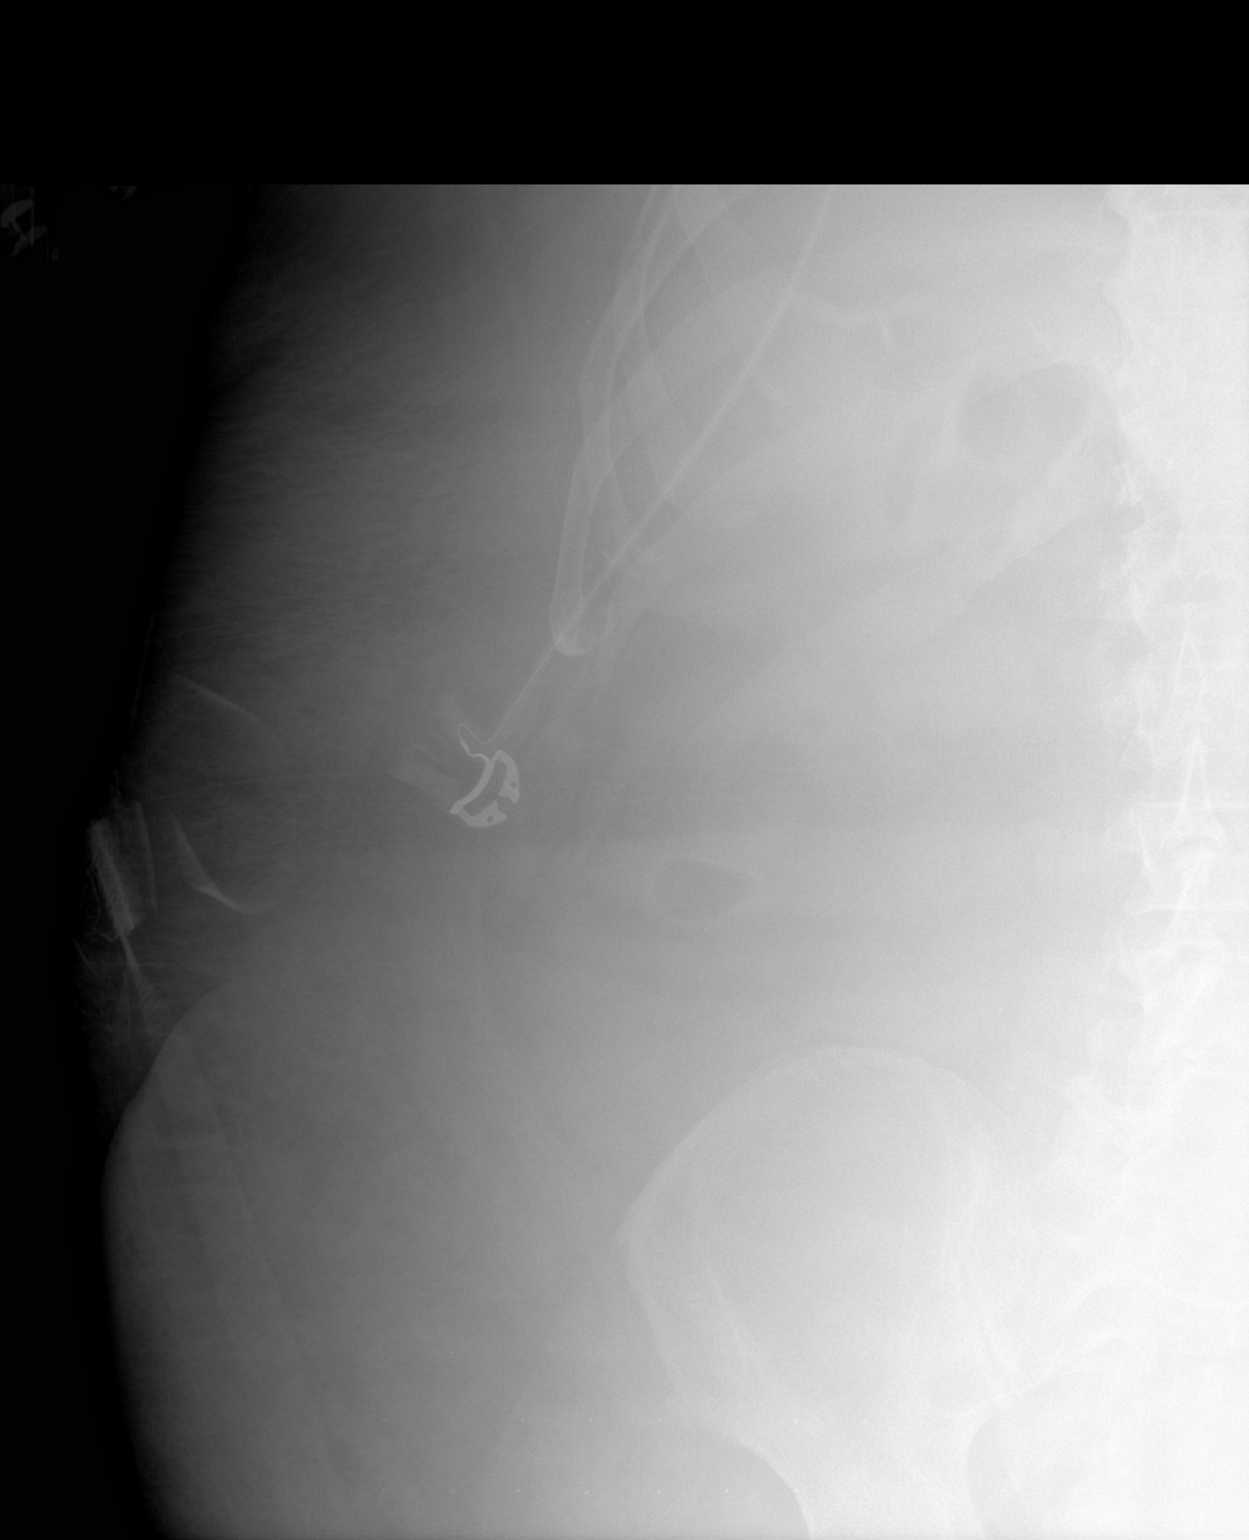

[abdomen kub (2 of 3)]
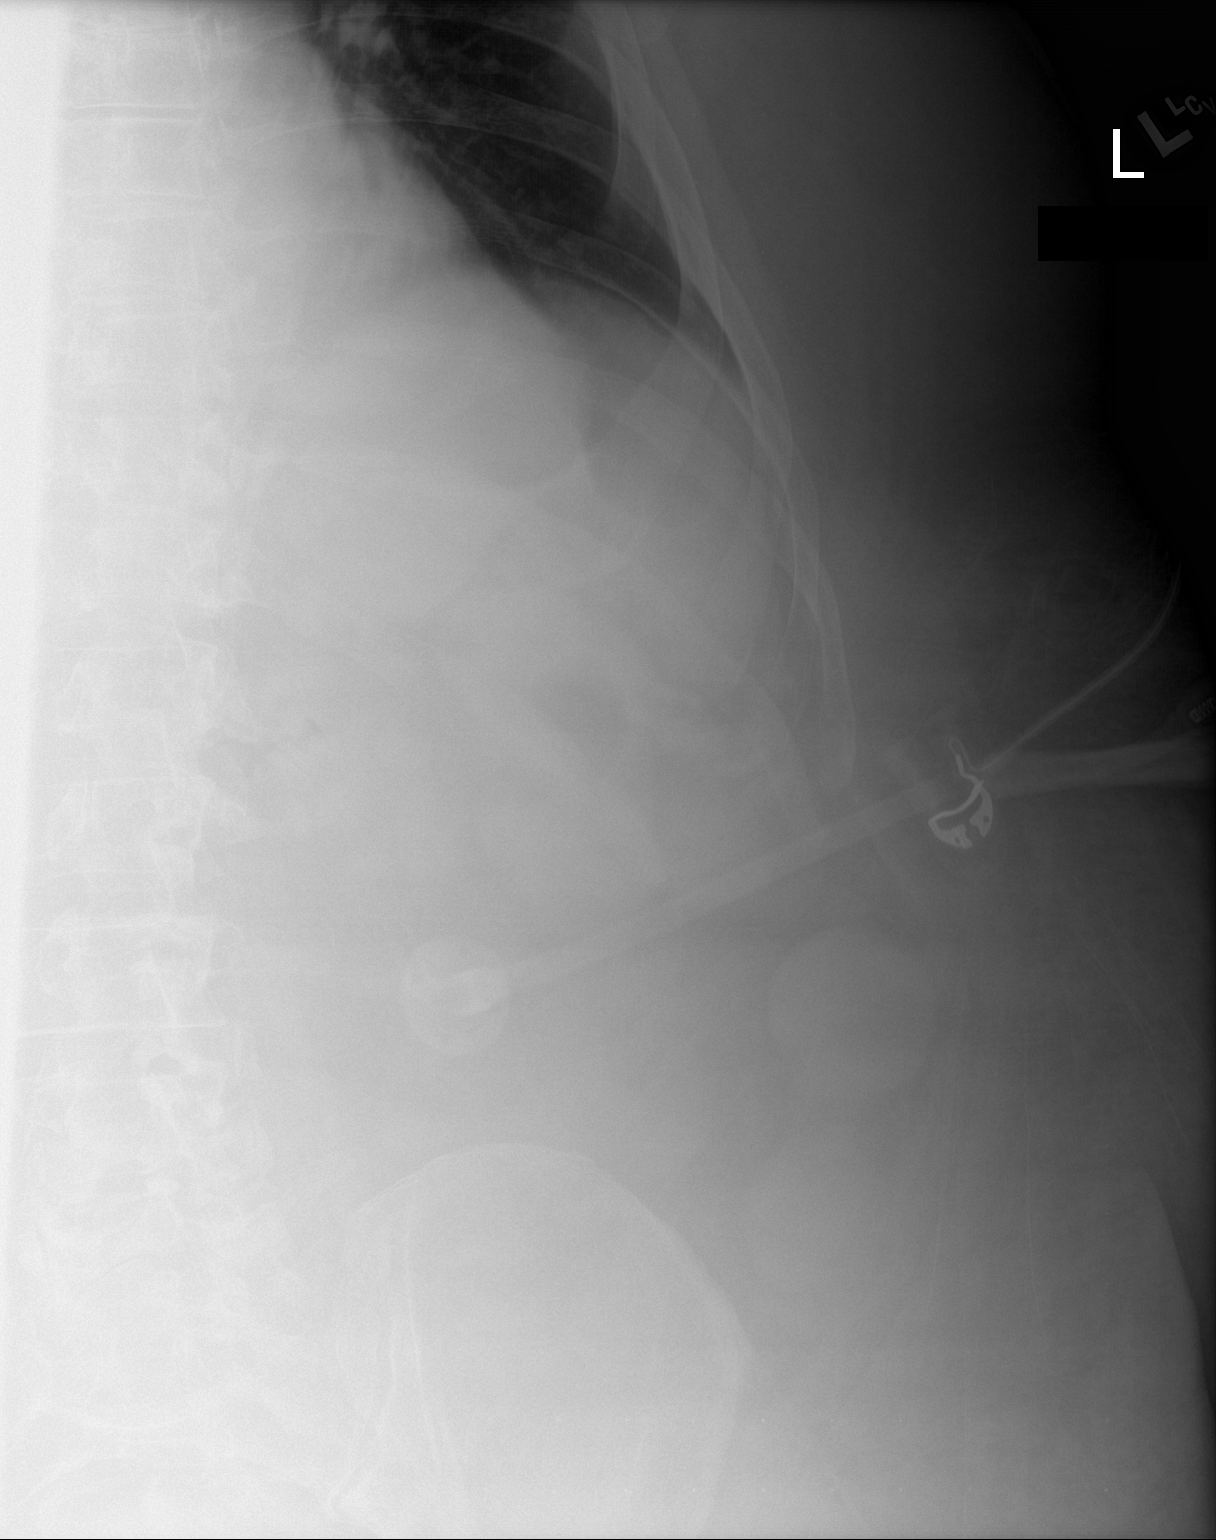

[abdomen kub (3 of 3)]
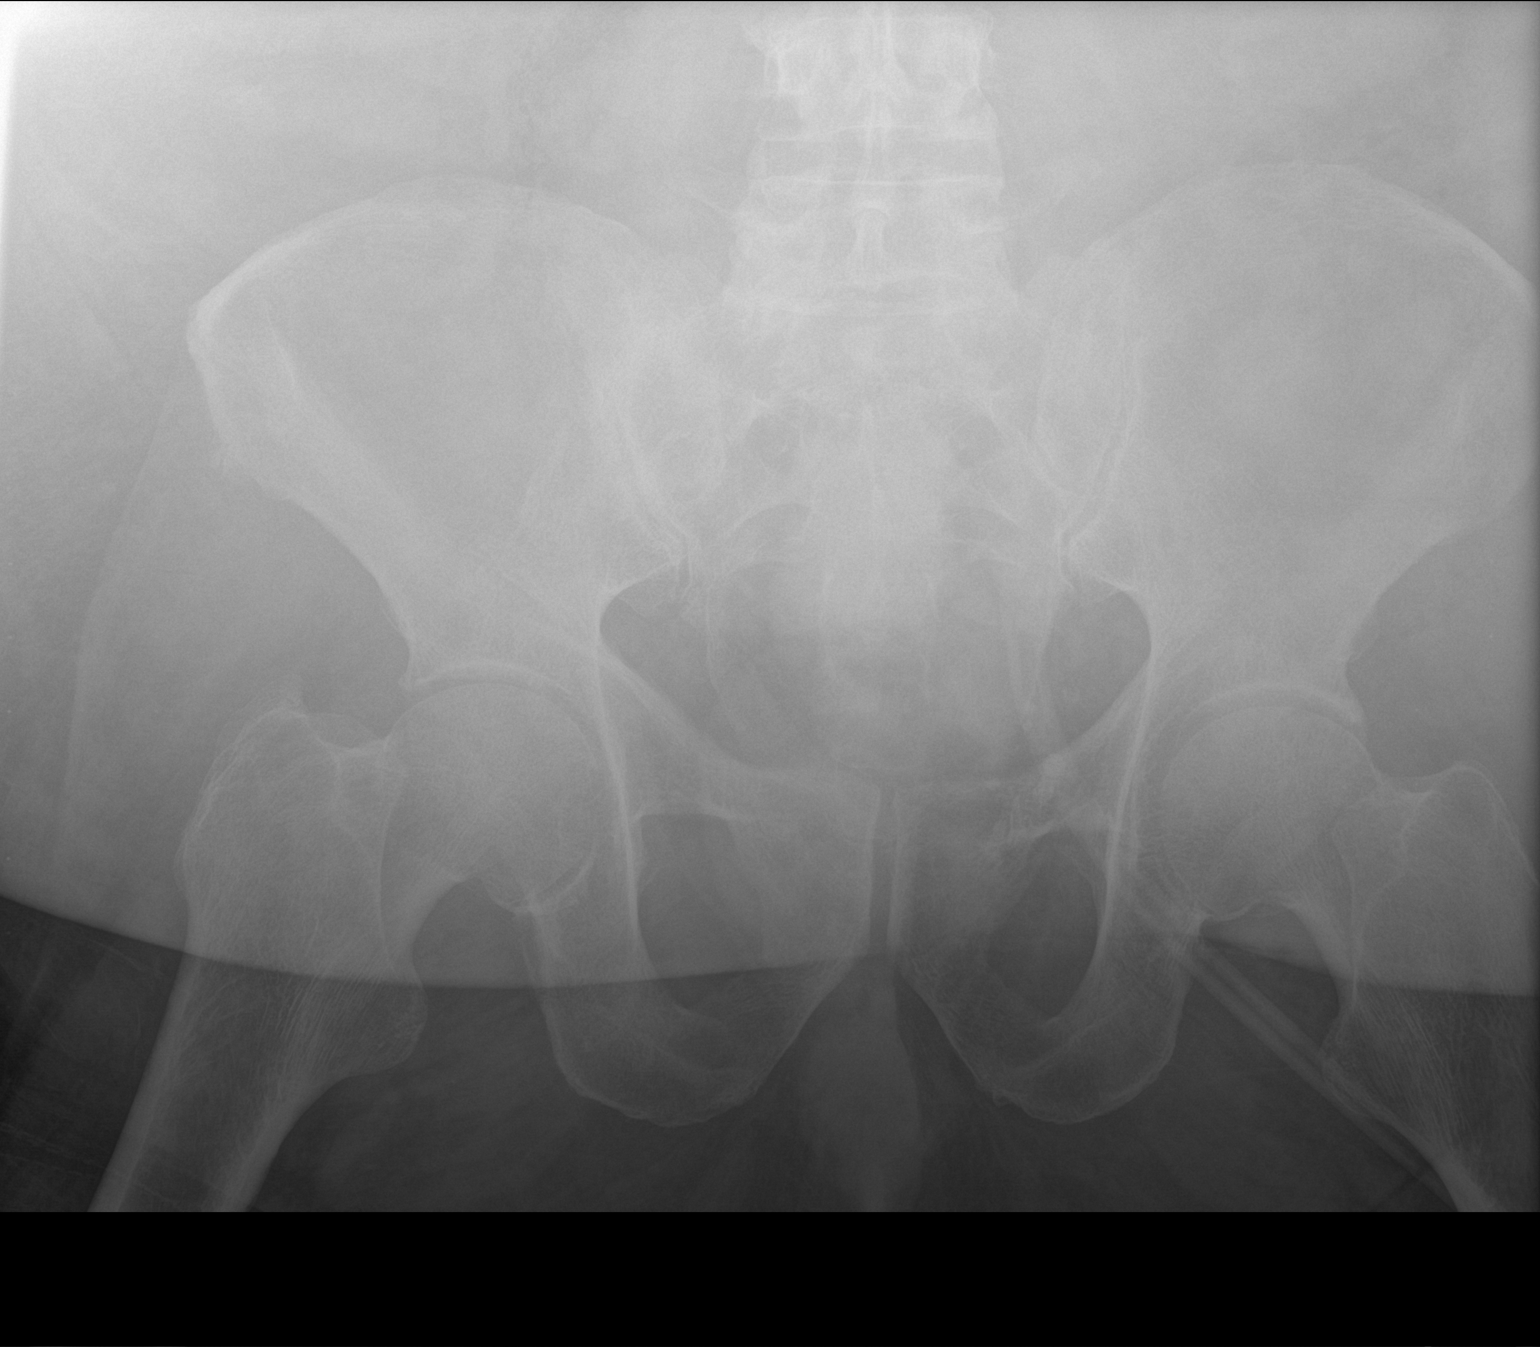

[3 of 3 positions shown; findings below may reference images not displayed]

FINDINGS: Exam limited by patient's body habitus. The visualized lung bases
are grossly clear. The bowel gas pattern is unremarkable. No dilated
loops of bowel to suggest small bowel obstruction or ileus. No free
air. The soft tissue shadows are grossly maintained. The bony
structures are grossly normal.
IMPRESSION: Limited examination but no definite findings for acute abdominal
process.

## 2017-06-22 ENCOUNTER — Ambulatory Visit: Payer: 59

## 2017-06-22 IMAGING — DX DG CHEST 1V PORT
1 series · 1 of 1 positions shown · non-contrast
Comparison: 11/15/2016.

CLINICAL DATA: 56-year-old male with respiratory failure.
Subsequent encounter.

EXAM:
PORTABLE CHEST 1 VIEW

[chest ap]
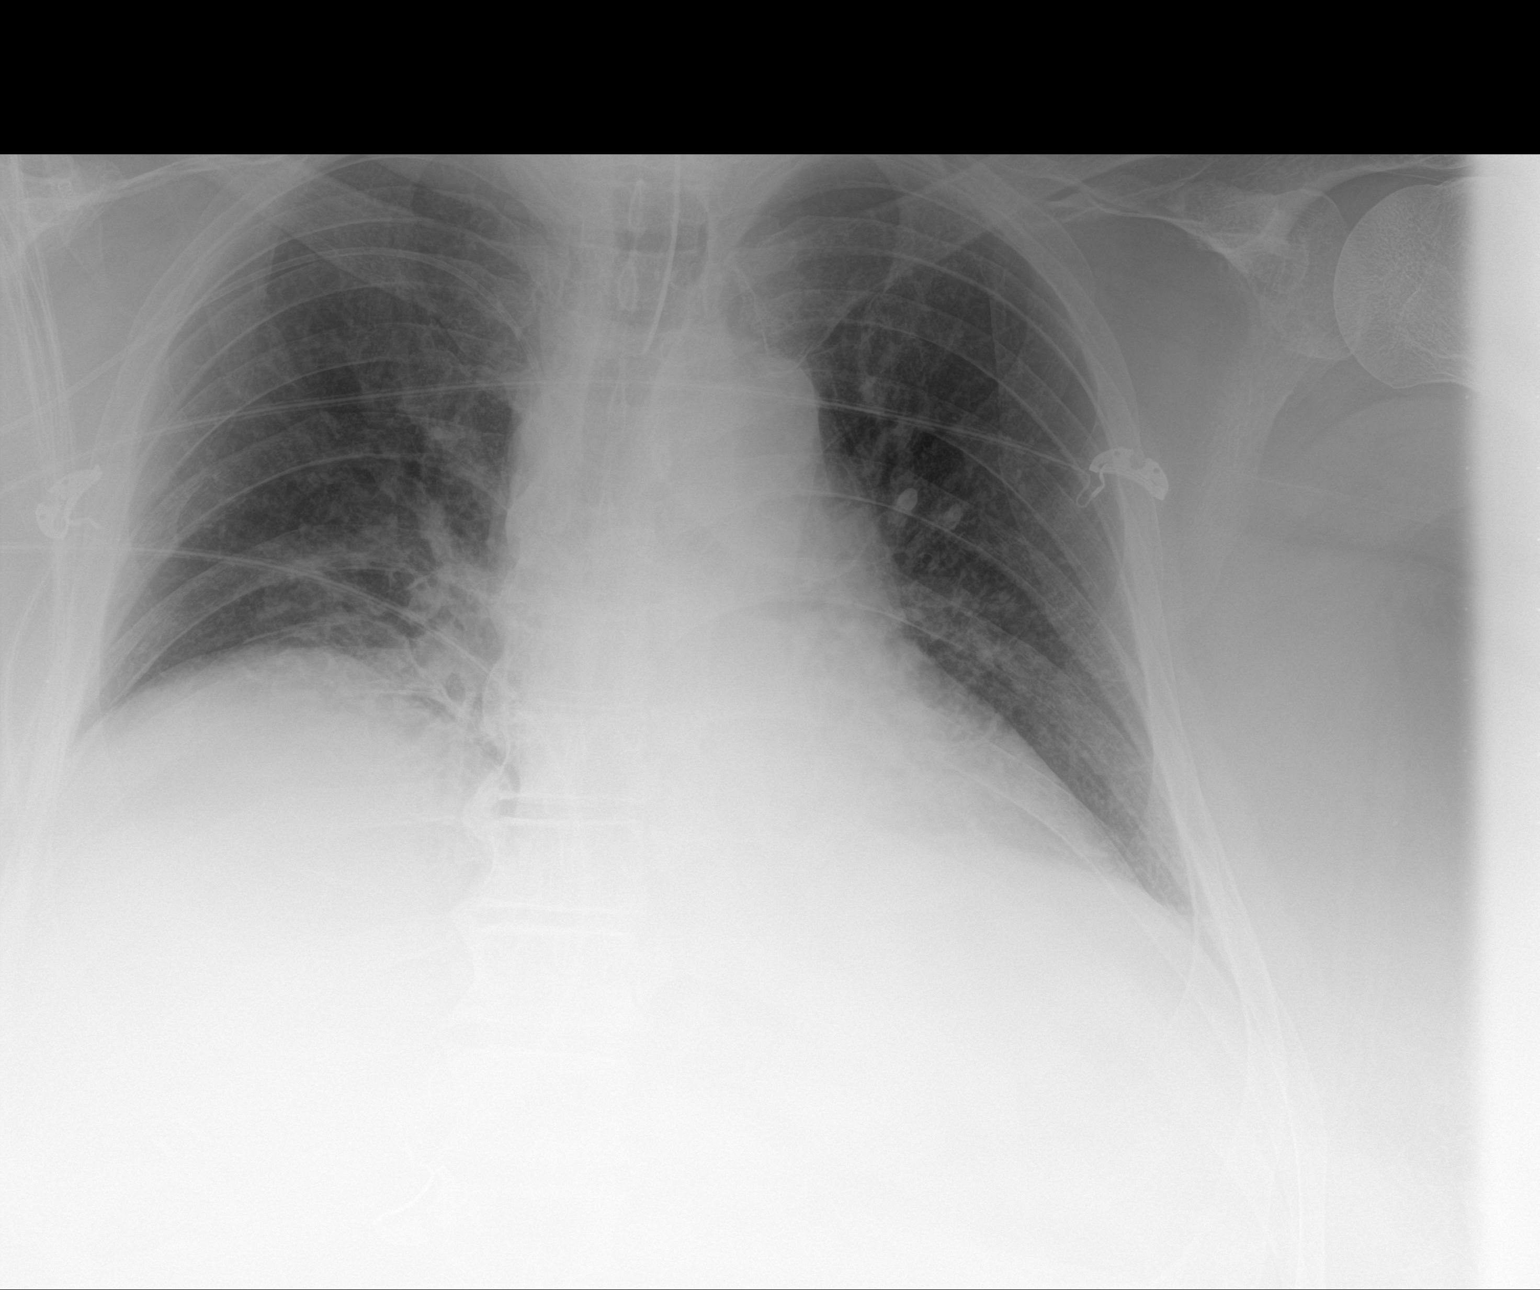

[1 of 1 positions shown; findings below may reference images not displayed]

FINDINGS: Endotracheal tube tip midline 4.4 cm above the carina.

Removal left central line with placement right PICC line with the
tip at the level of the proximal superior vena cava.

Poor inspiration with elevated right hemidiaphragm with right base
parenchymal changes suggestive of atelectasis rather than
infiltrate.

Central pulmonary vascular prominence without pulmonary edema.

No pneumothorax.

Heart size top-normal.

Slightly prominent aortic knob.
IMPRESSION: Poor inspiratory exam with right base parenchymal changes suggestive
of atelectasis rather than infiltrate.

Removal of left central line. Placement right PICC line with tip at
the level proximal superior vena cava.

## 2017-06-27 ENCOUNTER — Ambulatory Visit: Payer: BLUE CROSS/BLUE SHIELD

## 2017-06-27 DIAGNOSIS — M6281 Muscle weakness (generalized): Secondary | ICD-10-CM | POA: Diagnosis not present

## 2017-06-27 DIAGNOSIS — R2689 Other abnormalities of gait and mobility: Secondary | ICD-10-CM | POA: Diagnosis not present

## 2017-06-27 NOTE — Therapy (Signed)
[place] MAIN [facility] SERVICES [address] [place], [place], [address] Phone: [phone]   Fax:  [phone]  Physical Therapy Treatment  Patient Details  Name: Peter [name] MRN: [id number] Date of Birth: 08/25/1960 Referring Provider: Dr. Thurston Hole   Encounter Date: 06/27/2017  PT End of Session - 06/27/17 0926    Visit Number  7    Number of Visits  7    Date for PT Re-Evaluation  07/04/17    Authorization Type  7/7 visits    PT Start Time  0930    PT Stop Time  1015    PT Time Calculation (min)  45 min    Equipment Utilized During Treatment  Gait belt    Activity Tolerance  Patient tolerated treatment well;Patient limited by pain;Patient limited by fatigue    Behavior During Therapy  [facility] for tasks assessed/performed       Past Medical History:  Diagnosis Date  . Blood in stool   . Difficult intubation   . Glaucoma    since 2004  . Hypertension    since 2000  . Obesity, unspecified   . Ulcer 2011   gastric  . Unspecified hemorrhoids without mention of complication     Past Surgical History:  Procedure Laterality Date  . APPLICATION OF WOUND VAC N/A 11/16/2016   Procedure: APPLICATION OF WOUND VAC-ABDOMINAL;  Surgeon: Florene Glen, MD;  Location: [facility] ORS;  Service: General;  Laterality: N/A;  . APPLICATION OF WOUND VAC N/A 11/18/2016   Procedure: APPLICATION OF WOUND VAC;  Surgeon: Florene Glen, MD;  Location: [facility] ORS;  Service: General;  Laterality: N/A;  change  . APPLICATION OF WOUND VAC N/A 11/23/2016   Procedure: WOUND VAC CHANGE;  Surgeon: Olean Ree, MD;  Location: [facility] ORS;  Service: General;  Laterality: N/A;  wound vac application  . CARDIAC CATHETERIZATION Left 06/23/2016   Procedure: Left Heart Cath and Coronary Angiography;  Surgeon: Nelva Bush, MD;  Location: [facility];  Service: Cardiovascular;  Laterality: Left;  . CHOLECYSTECTOMY  1992  . COLONOSCOPY W/ POLYPECTOMY   05/22/2010   60mtransverse colon polyp, traditional serrated adenoma,negative for high grade dysplasia & malignancy. rectal polyp-556mnegative for dysplasia & malignancy.  . Marland KitchenRESSING CHANGE UNDER ANESTHESIA N/A 11/21/2016   Procedure: DRESSING CHANGE UNDER ANESTHESIA;  Surgeon: CoFlorene GlenMD;  Location: [facility] ORS;  Service: General;  Laterality: N/A;  . KNEE SURGERY Left 2010  . LAPAROSCOPIC APPENDECTOMY N/A 10/30/2016   Procedure: APPENDECTOMY LAPAROSCOPIC changed  to open application of wound vac;  Surgeon: DiJules HusbandsMD;  Location: [facility] ORS;  Service: General;  Laterality: N/A;  . LAPAROTOMY N/A 11/11/2016   Procedure: EXPLORATORY LAPAROTOMY, DEBRIDEMENT OF ABDOMINAL WOUND, [place] Surgeon: PaJules HusbandsMD;  Location: [facility] OR[facility];  Service: General;  Laterality: N/A;  . LAPAROTOMY N/A 11/12/2016   Procedure: EXPLORATORY LAPAROTOMY;  Surgeon: WoClayburn PertMD;  Location: [facility] OR[facility];  Service: General;  Laterality: N/A;  . LAPAROTOMY N/A 11/14/2016   Procedure: EXPLORATORY LAPAROTOMY; Irrigation, partial closure;  Surgeon: WoClayburn PertMD;  Location: [facility] ORS;  Service: General;  Laterality: N/A;    There were no vitals filed for this visit.  Subjective Assessment - 06/27/17 0933    Subjective  Patient reports soreness in L leg from standing and cooking all day. Went to gym 1x/ since last visit. Reports no pain just soreness.     Patient is accompained by:  Family  member    Pertinent History  Patient is a pleasant 56 year old male who presents to physical therapy for hyperaldosternoism related weakness. Patient having weakness after appendix surgery on April 28th. Feels like lost two months of time, regained memory in June. Was in the hospital (three hospitals, one long term rehab, and one rehab) for three and a half month. Had most of PT and OT at [facility], then had home healthcare PT and OT. Main problem is left leg, is tingling in it, not able to put full weight onto it.  Tight L piriformis.     Limitations  Lifting;Standing;Walking;House hold activities;Other (comment)    How long can you sit comfortably?  n/a    How long can you stand comfortably?  with walker ok, challenged without walker. couple minutes with walker    How long can you walk comfortably?  1000 ft     Patient Stated Goals  walking more, stronger and more secure with left leg    Currently in Pain?  No/denies       5x STS: 16 seconds with UE support, 15 seconds with no UE support, use R hand on R knee  Falls 10MWT: 1.0 m/s  TUG 13 sec LEFS: 49/80   Lifting LLE into figure four position in sitting to allow for donning and doffing of socks , educated on positioning and stretching for this ADL.  Negotiating obstacles at work when return to work in near future, sit to stand ratio time, warming up prior to walking floor   Ambulate using cane in public places and initially at work.   Gym HEP program, warm up and cool down protocol including stretching.                        PT Education - 06/27/17 0934    Education provided  Yes    Education Details  d/c POC, HEP gym program, exercise compliance    Person(s) Educated  Patient    Methods  Explanation;Demonstration;Verbal cues    Comprehension  Verbalized understanding;Returned demonstration       PT Short Term Goals - 06/27/17 0935      PT SHORT TERM GOAL #1   Title   Patient (< 55 years old) will complete five times sit to stand test in < 14 seconds indicating an increased LE strength and improved balance    Baseline  11/5: 17 seconds 12/24:  15 seconds no UE support    Time  2    Period  Weeks    Status  Partially Met      PT SHORT TERM GOAL #2   Title  Patient will be independent in home exercise program to improve strength/mobility for better functional independence with ADLs.    Baseline  HEP compliant    Time  2    Period  Weeks    Status  Achieved      PT SHORT TERM GOAL #3   Title  Patient will  deny any falls over past 4 weeks to demonstrate improved safety awareness at home and work    Baseline  denies any falls over the past month     Time  2    Period  Weeks    Status  Achieved        PT Long Term Goals - 06/27/17 0949      PT LONG TERM GOAL #1   Title  Patient (< 25 years old) will complete  five times sit to stand test in < 10 seconds indicating an increased LE strength and improved balance.  (Pended)     Baseline  11/5: 17 seconds 12/24: 15 seconds no UE support   (Pended)     Time  8  (Pended)     Period  Weeks  (Pended)     Status  New  (Pended)       PT LONG TERM GOAL #2   Title  Patient will increase 10 meter walk test to >1.50ms as to improve gait speed for better community ambulation and to reduce fall risk.  (Pended)     Baseline  11/5: .83 m/s 12/24: 1.0 m/s   (Pended)     Time  8  (Pended)     Period  Weeks  (Pended)     Status  Achieved  (Pended)       PT LONG TERM GOAL #3   Title  Patient will reduce timed up and go to <11 seconds to reduce fall risk and demonstrate improved transfer/gait ability.  (Pended)     Baseline  11/5: 14 12/24: 13 seconds  (Pended)     Time  8  (Pended)     Period  Weeks  (Pended)     Status  Partially Met  (Pended)       PT LONG TERM GOAL #4   Title  Patient will increase lower extremity functional scale to >60/80 to demonstrate improved functional mobility and increased tolerance with ADLs.   (Pended)     Baseline  05/09/17: 28/80 12/24: 49/80  (Pended)     Time  8  (Pended)     Period  Weeks  (Pended)     Status  Partially Met  (Pended)             Plan - 06/27/17 1015    Clinical Impression Statement  Patient progressed towards and/or met all goals at this time. Decreased compensatory patterning of ambulation performed with improved heel strike and step length of LLE. Pt. Did not require use of ADL for timed tests demonstrating improved independent gait mechanics. Patient progressed to d/c today but I will be happy  to see patient again in the future as needed.     Rehab Potential  Fair    Clinical Impairments Affecting Rehab Potential  long duration of hospitalization, hyperaldosternism, high blood pressure, (+) family support    PT Frequency  1x / week    PT Duration  8 weeks    PT Treatment/Interventions  Electrical Stimulation;Moist Heat;Traction;Ultrasound;Biofeedback;Therapeutic exercise;Therapeutic activities;Functional mobility training;Stair training;Gait training;DME Instruction;Balance training;Neuromuscular re-education;Patient/family education;Manual techniques;Passive range of motion;Energy conservation;Taping;Visual/perceptual remediation/compensation    PT Home Exercise Plan  see above    Consulted and Agree with Plan of Care  Patient;Family member/caregiver    Family Member Consulted  wife       Patient will benefit from skilled therapeutic intervention in order to improve the following deficits and impairments:  Abnormal gait, Decreased activity tolerance, Decreased balance, Decreased endurance, Decreased mobility, Decreased range of motion, Decreased safety awareness, Difficulty walking, Decreased strength, Hypomobility, Impaired flexibility, Impaired perceived functional ability, Increased muscle spasms, Impaired sensation, Postural dysfunction, Improper body mechanics, Pain  Visit Diagnosis: Muscle weakness (generalized)  Other abnormalities of gait and mobility     Problem List Patient Active Problem List   Diagnosis Date Noted  . Open abdominal incision with drainage   . 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 Necrotizing soft tissue infection 11/11/2016  . Appendicitis,  acute 10/30/2016  . Acute appendicitis with generalized peritonitis   . Hyperaldosteronism ([place]) 09/21/2016  . Class 3 obesity with body mass index (BMI) of 50.0 to 59.9 in adult 09/21/2016  . Decreased libido 09/21/2016  . Shortness of breath 06/15/2016  . Abnormal stress test 06/15/2016  . 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 Avitaminosis D 03/25/2015  . Blood in stool   . Clinical depression 06/21/2011  . Cannot sleep 08/22/2009  . Hypercholesterolemia without hypertriglyceridemia 01/28/2009  . Hay fever 10/22/2008  . Adjustment disorder with mixed anxiety and depressed mood 12/12/2007  . Essential (primary) hypertension 12/12/2007  . Apnea, sleep 12/12/2007   Janna Arch, PT, DPT   Janna Arch 06/27/2017, 10:16 AM  [place] MAIN [facility] SERVICES [address] [place], [place], [address] Phone: [phone]   Fax:  [phone]  Name: CLAVIN RUHLMAN MRN: [id number] Date of Birth: Jun 27, 1961

## 2017-07-02 ENCOUNTER — Other Ambulatory Visit: Payer: Self-pay | Admitting: Family Medicine

## 2017-07-08 DIAGNOSIS — E269 Hyperaldosteronism, unspecified: Secondary | ICD-10-CM | POA: Diagnosis not present

## 2017-07-08 DIAGNOSIS — I10 Essential (primary) hypertension: Secondary | ICD-10-CM | POA: Diagnosis not present

## 2017-07-18 ENCOUNTER — Ambulatory Visit (INDEPENDENT_AMBULATORY_CARE_PROVIDER_SITE_OTHER): Payer: BLUE CROSS/BLUE SHIELD | Admitting: Family Medicine

## 2017-07-18 VITALS — BP 152/82 | HR 60 | Temp 98.4°F | Resp 14 | Wt 266.8 lb

## 2017-07-18 DIAGNOSIS — I10 Essential (primary) hypertension: Secondary | ICD-10-CM | POA: Diagnosis not present

## 2017-07-18 DIAGNOSIS — G4733 Obstructive sleep apnea (adult) (pediatric): Secondary | ICD-10-CM

## 2017-07-18 DIAGNOSIS — R1319 Other dysphagia: Secondary | ICD-10-CM

## 2017-07-18 DIAGNOSIS — F3289 Other specified depressive episodes: Secondary | ICD-10-CM | POA: Diagnosis not present

## 2017-07-18 DIAGNOSIS — R29898 Other symptoms and signs involving the musculoskeletal system: Secondary | ICD-10-CM | POA: Diagnosis not present

## 2017-07-18 MED ORDER — SPIRONOLACTONE 50 MG PO TABS: 50.0000 mg | ORAL_TABLET | Freq: Every day | ORAL | 5 refills | Status: DC

## 2017-07-18 MED ORDER — PANTOPRAZOLE SODIUM 40 MG PO TBEC: 40.0000 mg | DELAYED_RELEASE_TABLET | ORAL | 5 refills | Status: DC

## 2017-07-18 NOTE — Progress Notes (Signed)
Peter [name]  MRN: [id number] DOB: 07/10/1960  Subjective:  HPI  Patient is here for 2 months follow up. HTN: patient states he checks his b/p and its usually around 150/. Patient did see Dr Gabriel Carina and she told him he does not have hyperaldosteronism. Not sure why his b/p staying elevated. BP Readings from Last 3 Encounters:  07/18/17 (!) 152/82  06/06/17 (!) 150/81  06/02/17 (!) 189/88   Wt Readings from Last 3 Encounters:  07/18/17 266 lb 12.8 oz (121 kg)  06/02/17 263 lb (119.3 kg)  05/31/17 263 lb (119.3 kg)   Patient is finished with physical therapy and is going to the gym. Left leg still weak and he is working on getting this stronger. Patient would like to discuss going back to work.  Patient has noticed Throat feels swollen, has hard time swallowing big pieces of food. No choking or feeling like food is coming back up.  Patient Active Problem List   Diagnosis Date Noted  . Open abdominal incision with drainage   . 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 Necrotizing soft tissue infection 11/11/2016  . Appendicitis, acute 10/30/2016  . Acute appendicitis with generalized peritonitis   . Hyperaldosteronism ([place]) 09/21/2016  . Class 3 obesity with body mass index (BMI) of 50.0 to 59.9 in adult 09/21/2016  . Decreased libido 09/21/2016  . Shortness of breath 06/15/2016  . Abnormal stress test 06/15/2016  . 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 Avitaminosis D 03/25/2015  . Blood in stool   . Clinical depression 06/21/2011  . Cannot sleep 08/22/2009  . Hypercholesterolemia without hypertriglyceridemia 01/28/2009  . Hay fever 10/22/2008  . Adjustment disorder with mixed anxiety and depressed mood 12/12/2007  . Essential (primary) hypertension 12/12/2007  . Apnea, sleep 12/12/2007    Past Medical History:  Diagnosis Date  . Blood in stool   . Difficult intubation   . Glaucoma    since 2004  . Hypertension    since 2000  . Obesity, unspecified   . Ulcer 2011     gastric  . Unspecified hemorrhoids without mention of complication     Social History   Socioeconomic History  . Marital status: Married    Spouse name: Not on file  . Number of children: Not on file  . Years of education: Not on file  . Highest education level: Not on file  Social Needs  . Financial resource strain: Not on file  . Food insecurity - worry: Not on file  . Food insecurity - inability: Not on file  . Transportation needs - medical: Not on file  . Transportation needs - non-medical: Not on file  Occupational History  . Not on file  Tobacco Use  . Smoking status: Never Smoker  . Smokeless tobacco: Never Used  Substance and Sexual Activity  . Alcohol use: Yes    Comment: occasionally  . Drug use: No  . Sexual activity: Not on file  Other Topics Concern  . Not on file  Social History Narrative  . Not on file    Outpatient Encounter Medications as of 07/18/2017  Medication Sig  . amLODipine (NORVASC) 10 MG tablet TAKE 1 TABLET BY MOUTH EVERY DAY  . aspirin EC 81 MG tablet Take 81 mg by mouth daily.  Marland Kitchen atorvastatin (LIPITOR) 40 MG tablet Take 1 tablet (40 mg total) by mouth daily.  . clonazePAM (KLONOPIN) 0.5 MG tablet Take 0.5 mg by mouth 2 (two) times daily.   Marland Kitchen EQUETRO 200 MG  CP12 12 hr capsule Take 400-600 mg by mouth 3 (three) times daily.   . Garlic 10 MG CAPS Take by mouth daily.  . hydrALAZINE (APRESOLINE) 100 MG tablet Take 1 tablet by mouth 3 (three) times daily.  Marland Kitchen ibuprofen (ADVIL,MOTRIN) 200 MG tablet Take 800 mg by mouth 3 (three) times daily as needed for headache or moderate pain.  Marland Kitchen lamoTRIgine (LAMICTAL) 200 MG tablet Take 400 mg by mouth at bedtime.   Marland Kitchen latanoprost (XALATAN) 0.005 % ophthalmic solution Administer 1 drop to both eyes nightly.  Marland Kitchen losartan (COZAAR) 100 MG tablet TAKE 1 TABLET BY MOUTH EVERY DAY  . metoprolol tartrate (LOPRESSOR) 25 MG tablet Take 1 tablet (25 mg total) by mouth 3 (three) times daily.  . NON FORMULARY CPAP  .  Omega-3 1000 MG CAPS Take by mouth daily.  . ondansetron (ZOFRAN-ODT) 8 MG disintegrating tablet Take 1 tablet (8 mg total) by mouth every 8 (eight) hours as needed for nausea or vomiting.  . potassium chloride (K-DUR) 10 MEQ tablet TAKE 1 TABLET(10 MEQ) BY MOUTH FOUR TIMES DAILY  . ranitidine (ZANTAC) 300 MG tablet TAKE 1 TABLET(300 MG) BY MOUTH TWICE DAILY  . RUTIN PO Take by mouth daily.  . sertraline (ZOLOFT) 100 MG tablet TAKE 1 TABLET BY MOUTH TWICE DAILY  . vitamin C (ASCORBIC ACID) 500 MG tablet Take 500 mg by mouth daily.  . [DISCONTINUED] amLODipine (NORVASC) 5 MG tablet Take 1 tablet (5 mg total) by mouth daily. (Patient taking differently: Take 10 mg by mouth daily. )  . [DISCONTINUED] glycerin adult 2 g suppository Place 1 suppository rectally as needed for constipation.  . [DISCONTINUED] ketoconazole (NIZORAL) 2 % cream Apply 1 application topically 2 (two) times daily. (Patient not taking: Reported on 05/31/2017)  . [DISCONTINUED] latanoprost (XALATAN) 0.005 % ophthalmic solution Place 1 drop into both eyes at bedtime.  . [DISCONTINUED] MAGNESIUM-OXIDE 400 (241.3 Mg) MG tablet Take 400 mg by mouth 2 (two) times daily.  . [DISCONTINUED] Melatonin 3 MG TABS Take by mouth at bedtime.  . [DISCONTINUED] Multiple Vitamin (MULTIVITAMIN) tablet Take 1 tablet by mouth daily.  . [DISCONTINUED] potassium chloride SA (K-DUR,KLOR-CON) 20 MEQ tablet Take 2 tablets (40 mEq total) by mouth 3 (three) times daily. (Patient not taking: Reported on 03/21/2017)  . [DISCONTINUED] risperiDONE (RISPERDAL) 2 MG tablet Take 2 mg by mouth at bedtime.  . [DISCONTINUED] Sennosides-Docusate Sodium (SENEXON-S PO) Take by mouth 2 (two) times daily as needed.  . [DISCONTINUED] sertraline (ZOLOFT) 50 MG tablet Take 100 mg by mouth daily.   . [DISCONTINUED] spironolactone (ALDACTONE) 25 MG tablet Take 50 mg by mouth daily.   . [DISCONTINUED] sucralfate (CARAFATE) 1 GM/10ML suspension Take 1 g by mouth 4 (four) times  daily.  . [DISCONTINUED] thiamine (VITAMIN B-1) 100 MG tablet Take 100 mg by mouth daily.  . [DISCONTINUED] Vitamin D, Ergocalciferol, (DRISDOL) 50000 units CAPS capsule Take 1 capsule (50,000 Units total) by mouth every 7 (seven) days. (Patient not taking: Reported on 03/21/2017)   No facility-administered encounter medications on file as of 07/18/2017.     Allergies  Allergen Reactions  . Oxycodone Other (See Comments)    Causes Hallucinations, auditory and tactile  . Isosorbide     Headache, nausea, vomiting    Review of Systems  Constitutional: Positive for malaise/fatigue.  Eyes: Negative.   Respiratory: Positive for shortness of breath.   Cardiovascular: Negative.   Gastrointestinal: Positive for constipation.       Throat feels swollen, has hard  time swallowing big pieces of food. No choking or feeling like food is coming back up.  Musculoskeletal: Positive for joint pain (left knee) and myalgias (left leg). Negative for back pain, falls and neck pain.  Skin: Negative.        Nails are very brutal.  Neurological: Positive for tingling (left leg and left upper toes, numbness also) and weakness. Negative for dizziness and tremors.  Endo/Heme/Allergies: Negative.   Psychiatric/Behavioral: Negative.     Objective:  BP (!) 152/82   Pulse 60   Temp 98.4 F (36.9 C)   Resp 14   Wt 266 lb 12.8 oz (121 kg)   BMI 41.79 kg/m   Physical Exam  Constitutional: He is oriented to person, place, and time and well-developed, well-nourished, and in no distress.  HENT:  Head: Normocephalic and atraumatic.  Eyes: Conjunctivae are normal. Pupils are equal, round, and reactive to light.  Neck: No thyromegaly present.  Cardiovascular: Normal rate, regular rhythm, normal heart sounds and intact distal pulses. Exam reveals no gallop.  No murmur heard. Pulmonary/Chest: Effort normal and breath sounds normal. No respiratory distress. He has no wheezes.  Abdominal: Soft.  Neurological: He is  alert and oriented to person, place, and time. Gait abnormal. GCS score is 15.  Using cane to help ambulate.  Skin: Skin is warm and dry.  Psychiatric: Mood, memory, affect and judgment normal.   Assessment and Plan :  1. Essential (primary) hypertension Elevated still. Discussed in details. Re start Spironolactone. Stop potassium medication at this time. Will check Renal on next visit. 2. Obstructive sleep apnea syndrome Uses CPAP. Tolerating it well.  3. Other dysphagia Start Protonix, continue zantac.Will follow no further work up at this time.  4. Weakness of both lower extremities Encouraged patient to continue going to the gym-discussed the safety for this and what to avoid. Signed handicap form to use. Discussed going back to work. Will follow up in 1 month and decide at that time.   5. Other depression Stable. 6.Obesity  HPI, Exam and A&P transcribed by Tiffany Kocher, RMA under direction and in the presence of Miguel Aschoff, MD. I have done the exam and reviewed the chart and it is accurate to the best of my knowledge. Development worker, community has been used and  any errors in dictation or transcription are unintentional. Miguel Aschoff M.D. [facility]

## 2017-07-18 NOTE — Patient Instructions (Signed)
Stop potasium medication at this time.

## 2017-07-25 ENCOUNTER — Other Ambulatory Visit: Payer: Self-pay | Admitting: Family Medicine

## 2017-07-25 MED ORDER — ATORVASTATIN CALCIUM 40 MG PO TABS: 40.0000 mg | ORAL_TABLET | Freq: Every day | ORAL | 3 refills | Status: DC

## 2017-07-25 NOTE — Telephone Encounter (Signed)
Pt contacted office for refill request on the following medications:  atorvastatin (LIPITOR) 40 MG tablet  [facility]  Pt stated that Dr. Rockey Situ was managing this medication but pt is no longer seeing Dr. Rockey Situ b/c he is a specialist. Pt is asking if he should continue the medication and if so will Dr. Rosanna Randy take management over this medication. Please advise. Thanks TNP

## 2017-07-25 NOTE — Telephone Encounter (Signed)
Please advise as stated below. Thank Edrick Kins, RMA

## 2017-07-25 NOTE — Telephone Encounter (Signed)
Ok to refill--1 year

## 2017-07-25 NOTE — Telephone Encounter (Signed)
rx sent in-Peter [name] V Peter [name], RMA  

## 2017-08-10 DIAGNOSIS — D225 Melanocytic nevi of trunk: Secondary | ICD-10-CM | POA: Diagnosis not present

## 2017-08-22 ENCOUNTER — Encounter: Payer: Self-pay | Admitting: Family Medicine

## 2017-08-22 ENCOUNTER — Ambulatory Visit (INDEPENDENT_AMBULATORY_CARE_PROVIDER_SITE_OTHER): Payer: BLUE CROSS/BLUE SHIELD | Admitting: Family Medicine

## 2017-08-22 VITALS — BP 130/72 | HR 66 | Temp 98.1°F | Resp 16 | Wt 271.0 lb

## 2017-08-22 DIAGNOSIS — I10 Essential (primary) hypertension: Secondary | ICD-10-CM | POA: Diagnosis not present

## 2017-08-22 DIAGNOSIS — M25562 Pain in left knee: Secondary | ICD-10-CM

## 2017-08-22 DIAGNOSIS — R6882 Decreased libido: Secondary | ICD-10-CM

## 2017-08-22 DIAGNOSIS — N529 Male erectile dysfunction, unspecified: Secondary | ICD-10-CM | POA: Diagnosis not present

## 2017-08-22 DIAGNOSIS — G8929 Other chronic pain: Secondary | ICD-10-CM | POA: Diagnosis not present

## 2017-08-22 DIAGNOSIS — F3289 Other specified depressive episodes: Secondary | ICD-10-CM | POA: Diagnosis not present

## 2017-08-22 DIAGNOSIS — R5383 Other fatigue: Secondary | ICD-10-CM

## 2017-08-22 MED ORDER — LOSARTAN POTASSIUM 100 MG PO TABS: 100.0000 mg | ORAL_TABLET | Freq: Every day | ORAL | 3 refills | Status: DC

## 2017-08-22 MED ORDER — HYDRALAZINE HCL 100 MG PO TABS: 100.0000 mg | ORAL_TABLET | Freq: Three times a day (TID) | ORAL | 3 refills | Status: DC

## 2017-08-22 NOTE — Progress Notes (Signed)
Patient: Peter [name] Male    DOB: 1961/04/22   57 y.o.   MRN: [id number] Visit Date: 08/22/2017  Today's Provider: [name], MD   Chief Complaint  Patient presents with  . Hypertension   Subjective:    HPI  Hypertension, follow-up:  BP Readings from Last 3 Encounters:  08/22/17 130/72  07/18/17 (!) 152/82  06/06/17 (!) 150/81    He was last seen for hypertension 1 months ago.  BP at that visit was 152/82. Management since that visit includes restarted Spirolactone. He reports good compliance with treatment. He is not having side effects.  He is exercising. He is adherent to low salt diet.   Outside blood pressures are good some days and elevated some days. Patient denies chest pain, chest pressure/discomfort, claudication, dyspnea, exertional chest pressure/discomfort, irregular heart beat, lower extremity edema, near-syncope, orthopnea, palpitations, paroxysmal nocturnal dyspnea, syncope and tachypnea.   Wt Readings from Last 3 Encounters:  08/22/17 271 lb (122.9 kg)  07/18/17 266 lb 12.8 oz (121 kg)  06/02/17 263 lb (119.3 kg)    ------------------------------------------------------------------------ Pt reports that he is getting stronger. He is visiting work about 3 days a week and after about 3-4 hours he gets weak mentally and physically. He would like to discuss ED. He also wants to discuss his left knee pain. He reports that this is the knee that he had "cleaned out" and this is what was holding him back from regaining his strength.  He does c/o ongoing fatigue and decreased libido.      Allergies  Allergen Reactions  . Oxycodone Other (See Comments)    Causes Hallucinations, auditory and tactile  . Isosorbide     Headache, nausea, vomiting     Current Outpatient Medications:  .  amLODipine (NORVASC) 10 MG tablet, TAKE 1 TABLET BY MOUTH EVERY DAY, Disp: 90 tablet, Rfl: 3 .  aspirin EC 81 MG tablet, Take 81 mg by mouth daily., Disp:  , Rfl:  .  atorvastatin (LIPITOR) 40 MG tablet, Take 1 tablet (40 mg total) by mouth daily., Disp: 90 tablet, Rfl: 3 .  clonazePAM (KLONOPIN) 0.5 MG tablet, Take 0.5 mg by mouth 2 (two) times daily. , Disp: , Rfl:  .  EQUETRO 200 MG CP12 12 hr capsule, Take 400-600 mg by mouth 3 (three) times daily. , Disp: , Rfl: 7 .  Garlic 10 MG CAPS, Take by mouth daily., Disp: , Rfl:  .  hydrALAZINE (APRESOLINE) 100 MG tablet, Take 1 tablet by mouth 3 (three) times daily., Disp: , Rfl:  .  lamoTRIgine (LAMICTAL) 200 MG tablet, Take 400 mg by mouth at bedtime. , Disp: , Rfl:  .  latanoprost (XALATAN) 0.005 % ophthalmic solution, Administer 1 drop to both eyes nightly., Disp: , Rfl:  .  losartan (COZAAR) 100 MG tablet, TAKE 1 TABLET BY MOUTH EVERY DAY, Disp: 90 tablet, Rfl: 2 .  metoprolol tartrate (LOPRESSOR) 25 MG tablet, Take 1 tablet (25 mg total) by mouth 3 (three) times daily., Disp: 90 tablet, Rfl: 2 .  NON FORMULARY, CPAP, Disp: , Rfl:  .  Omega-3 1000 MG CAPS, Take by mouth daily., Disp: , Rfl:  .  ondansetron (ZOFRAN-ODT) 8 MG disintegrating tablet, Take 1 tablet (8 mg total) by mouth every 8 (eight) hours as needed for nausea or vomiting., Disp: 90 tablet, Rfl: 0 .  pantoprazole (PROTONIX) 40 MG tablet, Take 1 tablet (40 mg total) by mouth every morning., Disp: 30 tablet,  Rfl: 5 .  ranitidine (ZANTAC) 300 MG tablet, TAKE 1 TABLET(300 MG) BY MOUTH TWICE DAILY, Disp: 60 tablet, Rfl: 11 .  RUTIN PO, Take by mouth daily., Disp: , Rfl:  .  sertraline (ZOLOFT) 100 MG tablet, TAKE 1 TABLET BY MOUTH TWICE DAILY, Disp: 180 tablet, Rfl: 3 .  spironolactone (ALDACTONE) 50 MG tablet, Take 1 tablet (50 mg total) by mouth daily., Disp: 30 tablet, Rfl: 5 .  vitamin C (ASCORBIC ACID) 500 MG tablet, Take 500 mg by mouth daily., Disp: , Rfl:  .  clobetasol ointment (TEMOVATE) 0.05 %, , Disp: , Rfl: 1 .  ibuprofen (ADVIL,MOTRIN) 200 MG tablet, Take 800 mg by mouth 3 (three) times daily as needed for headache or  moderate pain., Disp: , Rfl:  .  LUMIGAN 0.01 % SOLN, INT 1 GTT IN OU QD IN THE EVE, Disp: , Rfl: 7 .  potassium chloride (K-DUR) 10 MEQ tablet, TAKE 1 TABLET(10 MEQ) BY MOUTH FOUR TIMES DAILY (Patient not taking: Reported on 08/22/2017), Disp: 120 tablet, Rfl: 11  Review of Systems  Constitutional: Positive for fatigue.  HENT: Negative.   Eyes: Negative.   Respiratory: Negative.   Cardiovascular: Negative.   Gastrointestinal: Negative.   Genitourinary:       ED  Musculoskeletal: Positive for arthralgias.  Skin: Negative.   Allergic/Immunologic: Negative.   Neurological: Positive for weakness.  Hematological: Negative.   Psychiatric/Behavioral: Negative.     Social History   Tobacco Use  . Smoking status: Never Smoker  . Smokeless tobacco: Never Used  Substance Use Topics  . Alcohol use: Yes    Comment: occasionally   Objective:   BP 130/72 (BP Location: Left Arm, Patient Position: Sitting, Cuff Size: Large)   Pulse 66   Temp 98.1 F (36.7 C) (Oral)   Resp 16   Wt 271 lb (122.9 kg)   BMI 42.44 kg/m  Vitals:   08/22/17 0940  BP: 130/72  Pulse: 66  Resp: 16  Temp: 98.1 F (36.7 C)  TempSrc: Oral  Weight: 271 lb (122.9 kg)     Physical Exam  Constitutional: He is oriented to person, place, and time. He appears well-developed and well-nourished.  HENT:  Head: Normocephalic and atraumatic.  Eyes: Conjunctivae and EOM are normal. Pupils are equal, round, and reactive to light.  Neck: Normal range of motion. Neck supple.  Cardiovascular: Normal rate, regular rhythm, normal heart sounds and intact distal pulses.  Pulmonary/Chest: Effort normal and breath sounds normal.  Abdominal: He exhibits mass.  firmness in the abdomen, likely scar tissue.Mass across upper abdomen and through abd scar.  Musculoskeletal: Normal range of motion.  Neurological: He is alert and oriented to person, place, and time. He has normal reflexes.  Skin: Skin is warm and dry.    Psychiatric: He has a normal mood and affect. His behavior is normal. Judgment and thought content normal.        Assessment & Plan:     1. Chronic pain of left knee  - Ambulatory referral to Orthopedic Surgery  2. Essential (primary) hypertension May increase Spirolactone to BID. Continue monitor BP at home. Follow up in 1 month. - Renal function panel  3. Other depression Improved.   4. Other fatigue  - Testosterone,Free and Total  5. Decreased libido  - Ambulatory referral to Urology - Testosterone,Free and Total  6. Erectile dysfunction, unspecified erectile dysfunction type  - Ambulatory referral to Urology 7.abdominal Mass  I think this if from scar tissue from  healing from secondary healing of open laparotomy.Will follo.    HPI, Exam, and A&P Transcribed under the direction and in the presence of Amelio Brosky L. Cranford Mon, MD  Electronically Signed: Katina Dung, [place], MD  [facility]

## 2017-08-23 LAB — TESTOSTERONE,FREE AND TOTAL
Testosterone, Free: 2.1 pg/mL — ABNORMAL LOW (ref 7.2–24.0)
Testosterone: 349 ng/dL (ref 264–916)

## 2017-08-23 LAB — RENAL FUNCTION PANEL
ALBUMIN: 4.2 g/dL (ref 3.5–5.5)
BUN/Creatinine Ratio: 27 — ABNORMAL HIGH (ref 9–20)
BUN: 26 mg/dL — AB (ref 6–24)
CHLORIDE: 106 mmol/L (ref 96–106)
CO2: 23 mmol/L (ref 20–29)
Calcium: 9 mg/dL (ref 8.7–10.2)
Creatinine, Ser: 0.97 mg/dL (ref 0.76–1.27)
GFR, EST AFRICAN AMERICAN: 98 mL/min/{1.73_m2} (ref >59)
GFR, EST NON AFRICAN AMERICAN: 85 mL/min/{1.73_m2} (ref >59)
Glucose: 90 mg/dL (ref 65–99)
PHOSPHORUS: 4 mg/dL (ref 2.5–4.5)
POTASSIUM: 3.9 mmol/L (ref 3.5–5.2)
Sodium: 145 mmol/L — ABNORMAL HIGH (ref 134–144)

## 2017-08-26 ENCOUNTER — Telehealth: Payer: Self-pay

## 2017-08-26 NOTE — Telephone Encounter (Signed)
Patient advised as directed below.  Thanks,  -Joseline 

## 2017-08-26 NOTE — Telephone Encounter (Signed)
-----   Message from Jerrol Banana., MD sent at 08/26/2017  1:20 PM EST ----- Mildly dehydrated--will follow borderline low testosterone .

## 2017-08-29 ENCOUNTER — Other Ambulatory Visit: Payer: Self-pay | Admitting: Family Medicine

## 2017-08-29 DIAGNOSIS — I10 Essential (primary) hypertension: Secondary | ICD-10-CM

## 2017-09-05 DIAGNOSIS — F3175 Bipolar disorder, in partial remission, most recent episode depressed: Secondary | ICD-10-CM | POA: Diagnosis not present

## 2017-09-07 DIAGNOSIS — M1712 Unilateral primary osteoarthritis, left knee: Secondary | ICD-10-CM | POA: Diagnosis not present

## 2017-09-07 DIAGNOSIS — M25562 Pain in left knee: Secondary | ICD-10-CM | POA: Diagnosis not present

## 2017-09-12 DIAGNOSIS — F4320 Adjustment disorder, unspecified: Secondary | ICD-10-CM | POA: Diagnosis not present

## 2017-09-14 ENCOUNTER — Ambulatory Visit (INDEPENDENT_AMBULATORY_CARE_PROVIDER_SITE_OTHER): Payer: BLUE CROSS/BLUE SHIELD | Admitting: Urology

## 2017-09-14 ENCOUNTER — Encounter: Payer: Self-pay | Admitting: Urology

## 2017-09-14 VITALS — BP 156/89 | HR 65 | Ht 67.0 in | Wt 267.4 lb

## 2017-09-14 DIAGNOSIS — N529 Male erectile dysfunction, unspecified: Secondary | ICD-10-CM | POA: Insufficient documentation

## 2017-09-14 NOTE — Progress Notes (Signed)
09/14/2017 2:23 PM   Peter [name] 03-12-1961 [id number]  Referring provider: Jerrol Banana., MD [address] [place], [place] [address]  Chief Complaint  Patient presents with  . Erectile Dysfunction    HPI: Peter [name] is a 57 year old male seen in consultation at the request of Dr. Rosanna Randy for evaluation of erectile dysfunction and hypogonadism. He has a several year history of erectile dysfunction and hypogonadism and was being followed by Dr. Yves Dill.  He states his problems significantly worsened after appendicitis in April 2018.  He underwent an attempt at laparoscopic appendectomy which was converted to open appendectomy.  He was readmitted with bowel evisceration and a necrotizing soft tissue infection.  He states after this hospitalization his erection significantly worsened.  He presently denies spontaneous erections or partial erections.  He had no pain or curvature when he was having partial erections.  He denies early morning erections.  He has no voiding complaints.  He does have a history of hypogonadism and low libido.  He was on testosterone replacement and states this did not help his symptoms or his ED.  A recent total testosterone level was normal at 349 however a free testosterone was low at 2.1.  He has tried and failed Viagra, generic sildenafil, Cialis and MUSE.   PMH: Past Medical History:  Diagnosis Date  . Blood in stool   . Difficult intubation   . Glaucoma    since 2004  . Hypertension    since 2000  . Obesity, unspecified   . Ulcer 2011   gastric  . Unspecified hemorrhoids without mention of complication     Surgical History: Past Surgical History:  Procedure Laterality Date  . APPLICATION OF WOUND VAC N/A 11/16/2016   Procedure: APPLICATION OF WOUND VAC-ABDOMINAL;  Surgeon: Florene Glen, MD;  Location: [facility];  Service: General;  Laterality: N/A;  . APPLICATION OF WOUND VAC N/A 11/18/2016   Procedure: APPLICATION  OF WOUND VAC;  Surgeon: Florene Glen, MD;  Location: [facility];  Service: General;  Laterality: N/A;  change  . APPLICATION OF WOUND VAC N/A 11/23/2016   Procedure: WOUND VAC CHANGE;  Surgeon: Olean Ree, MD;  Location: [facility];  Service: General;  Laterality: N/A;  wound vac application  . CARDIAC CATHETERIZATION Left 06/23/2016   Procedure: Left Heart Cath and Coronary Angiography;  Surgeon: Nelva Bush, MD;  Location: [facility];  Service: Cardiovascular;  Laterality: Left;  . CHOLECYSTECTOMY  1992  . COLONOSCOPY W/ POLYPECTOMY  05/22/2010   26mmtransverse colon polyp, traditional serrated adenoma,negative for high grade dysplasia & malignancy. rectal polyp-51mm,negative for dysplasia & malignancy.  Marland Kitchen DRESSING CHANGE UNDER ANESTHESIA N/A 11/21/2016   Procedure: DRESSING CHANGE UNDER ANESTHESIA;  Surgeon: Florene Glen, MD;  Location: [facility];  Service: General;  Laterality: N/A;  . KNEE SURGERY Left 2010  . LAPAROSCOPIC APPENDECTOMY N/A 10/30/2016   Procedure: APPENDECTOMY LAPAROSCOPIC changed  to open application of wound vac;  Surgeon: [name], MD;  Location: [facility];  Service: General;  Laterality: N/A;  . LAPAROTOMY N/A 11/11/2016   Procedure: EXPLORATORY LAPAROTOMY, DEBRIDEMENT OF ABDOMINAL WOUND, ABDOMINAL [place];  Surgeon: [name], MD;  Location: [facility];  Service: General;  Laterality: N/A;  . LAPAROTOMY N/A 11/12/2016   Procedure: EXPLORATORY LAPAROTOMY;  Surgeon: Clayburn Pert, MD;  Location: [facility];  Service: General;  Laterality: N/A;  . LAPAROTOMY N/A 11/14/2016   Procedure: EXPLORATORY LAPAROTOMY; Irrigation, partial closure;  Surgeon: Adonis Huguenin,  Juanda Crumble, MD;  Location: [facility];  Service: General;  Laterality: N/A;    Home Medications:  Allergies as of 09/14/2017      Reactions   Oxycodone Other (See Comments)   Causes Hallucinations, auditory and tactile   Isosorbide    Headache, nausea, vomiting      Medication List         Accurate as of 09/14/17  2:23 PM. Always use your most recent med list.          amLODipine 10 MG tablet Commonly known as:  NORVASC TAKE 1 TABLET BY MOUTH EVERY DAY   aspirin EC 81 MG tablet Take 81 mg by mouth daily.   atorvastatin 40 MG tablet Commonly known as:  LIPITOR Take 1 tablet (40 mg total) by mouth daily.   clobetasol ointment 0.05 % Commonly known as:  TEMOVATE   clonazePAM 0.5 MG tablet Commonly known as:  KLONOPIN Take 0.5 mg by mouth 2 (two) times daily.   EQUETRO 200 MG Cp12 12 hr capsule Generic drug:  carbamazepine Take 400-600 mg by mouth 3 (three) times daily.   Garlic 10 MG Caps Take by mouth daily.   hydrALAZINE 100 MG tablet Commonly known as:  APRESOLINE Take 1 tablet (100 mg total) by mouth 3 (three) times daily.   ibuprofen 200 MG tablet Commonly known as:  ADVIL,MOTRIN Take 800 mg by mouth 3 (three) times daily as needed for headache or moderate pain.   lamoTRIgine 200 MG tablet Commonly known as:  LAMICTAL Take 400 mg by mouth at bedtime.   latanoprost 0.005 % ophthalmic solution Commonly known as:  XALATAN Administer 1 drop to both eyes nightly.   losartan 100 MG tablet Commonly known as:  COZAAR Take 1 tablet (100 mg total) by mouth daily.   LUMIGAN 0.01 % Soln Generic drug:  bimatoprost INT 1 GTT IN OU QD IN THE EVE   metoprolol tartrate 25 MG tablet Commonly known as:  LOPRESSOR TAKE 1 TABLET(25 MG) BY MOUTH THREE TIMES DAILY   NON FORMULARY CPAP   Omega-3 1000 MG Caps Take by mouth daily.   ondansetron 8 MG disintegrating tablet Commonly known as:  ZOFRAN-ODT Take 1 tablet (8 mg total) by mouth every 8 (eight) hours as needed for nausea or vomiting.   pantoprazole 40 MG tablet Commonly known as:  PROTONIX Take 1 tablet (40 mg total) by mouth every morning.   ranitidine 300 MG tablet Commonly known as:  ZANTAC TAKE 1 TABLET(300 MG) BY MOUTH TWICE DAILY   RUTIN PO Take by mouth daily.   sertraline 100 MG  tablet Commonly known as:  ZOLOFT TAKE 1 TABLET BY MOUTH TWICE DAILY   spironolactone 50 MG tablet Commonly known as:  ALDACTONE Take 1 tablet (50 mg total) by mouth daily.   vitamin C 500 MG tablet Commonly known as:  ASCORBIC ACID Take 500 mg by mouth daily.       Allergies:  Allergies  Allergen Reactions  . Oxycodone Other (See Comments)    Causes Hallucinations, auditory and tactile  . Isosorbide     Headache, nausea, vomiting    Family History: Family History  Problem Relation Age of Onset  . Cancer Other        colon  . Hyperlipidemia Mother   . Hypertension Mother     Social History:  reports that  has never smoked. he has never used smokeless tobacco. He reports that he drinks alcohol. He reports that he does not use  drugs.  ROS: UROLOGY Frequent Urination?: Yes Hard to postpone urination?: No Burning/pain with urination?: No Get up at night to urinate?: Yes Leakage of urine?: Yes Urine stream starts and stops?: No Trouble starting stream?: No Do you have to strain to urinate?: No Blood in urine?: No Urinary tract infection?: No Sexually transmitted disease?: No Injury to kidneys or bladder?: No Painful intercourse?: No Weak stream?: No Erection problems?: Yes Penile pain?: No  Gastrointestinal Nausea?: No Vomiting?: No Indigestion/heartburn?: No Diarrhea?: No Constipation?: No  Constitutional Fever: No Night sweats?: No Weight loss?: No Fatigue?: Yes  Skin Skin rash/lesions?: No Itching?: No  Eyes Blurred vision?: No Double vision?: No  Ears/Nose/Throat Sore throat?: No Sinus problems?: No  Hematologic/Lymphatic Swollen glands?: No Easy bruising?: No  Cardiovascular Leg swelling?: No Chest pain?: No  Respiratory Cough?: No Shortness of breath?: No  Endocrine Excessive thirst?: No  Musculoskeletal Back pain?: No Joint pain?: No  Neurological Headaches?: No Dizziness?: No  Psychologic Depression?:  Yes Anxiety?: Yes  Physical Exam: BP (!) 156/89 (BP Location: Right Arm, Patient Position: Sitting, Cuff Size: Large)   Pulse 65   Ht 5\' 7"  (1.702 m)   Wt 267 lb 6.4 oz (121.3 kg)   BMI 41.88 kg/m   Constitutional:  Alert and oriented, No acute distress. HEENT: [place] AT, moist mucus membranes.  Trachea midline, no masses. Cardiovascular: No clubbing, cyanosis, or edema. Respiratory: Normal respiratory effort, no increased work of breathing. GI: Abdomen is soft, nontender GU: No CVA tenderness; penis without lesions.  There is a palpable dorsal midshaft plaque.  Left testis is atrophic. Lymph: No cervical or inguinal lymphadenopathy. Skin: No rashes, bruises or suspicious lesions. Neurologic: Grossly intact, no focal deficits, moving all 4 extremities. Psychiatric: Normal mood and affect.  Laboratory Data: Lab Results  Component Value Date   WBC 5.6 06/02/2017   HGB 12.8 (L) 06/02/2017   HCT 38.1 (L) 06/02/2017   MCV 80.6 06/02/2017   PLT 208 06/02/2017    Lab Results  Component Value Date   CREATININE 0.97 08/22/2017    Lab Results  Component Value Date   PSA 0.9 06/01/2017    Lab Results  Component Value Date   TESTOSTERONE 349 08/22/2017     Assessment & Plan:   57 year old male with erectile dysfunction and hypogonadism.  He has failed PDE 5 inhibitor therapy and Muse.  He also states he has used a vacuum erection device and did not have success.  We discussed intracavernosal injections and penile implant surgery.  He was provided literature on intracavernosal injections and is interested in this treatment.  He will follow-up for a test injection and injection training.    Potential risks were discussed including priapism and development of injection site nodularity.  He has previously been on testosterone therapy without improvement in symptoms.    Abbie Sons, [place] [address], [place], [place]  [id number] [phone]

## 2017-09-19 ENCOUNTER — Ambulatory Visit: Payer: Self-pay | Admitting: Family Medicine

## 2017-09-19 DIAGNOSIS — F4320 Adjustment disorder, unspecified: Secondary | ICD-10-CM | POA: Diagnosis not present

## 2017-09-26 ENCOUNTER — Ambulatory Visit (INDEPENDENT_AMBULATORY_CARE_PROVIDER_SITE_OTHER): Payer: BLUE CROSS/BLUE SHIELD | Admitting: Family Medicine

## 2017-09-26 ENCOUNTER — Encounter: Payer: Self-pay | Admitting: Family Medicine

## 2017-09-26 VITALS — BP 144/72 | HR 64 | Temp 98.0°F | Resp 16 | Wt 272.0 lb

## 2017-09-26 DIAGNOSIS — G4733 Obstructive sleep apnea (adult) (pediatric): Secondary | ICD-10-CM

## 2017-09-26 DIAGNOSIS — F3289 Other specified depressive episodes: Secondary | ICD-10-CM | POA: Diagnosis not present

## 2017-09-26 DIAGNOSIS — I10 Essential (primary) hypertension: Secondary | ICD-10-CM

## 2017-09-26 DIAGNOSIS — N529 Male erectile dysfunction, unspecified: Secondary | ICD-10-CM

## 2017-09-26 DIAGNOSIS — Z6841 Body Mass Index (BMI) 40.0 and over, adult: Secondary | ICD-10-CM | POA: Diagnosis not present

## 2017-09-26 NOTE — Progress Notes (Signed)
Patient: Peter [name] Male    DOB: 1961-05-05   57 y.o.   MRN: [id number] Visit Date: 09/26/2017  Today's Provider: [name], MD   Chief Complaint  Patient presents with  . Hypertension    One month follow up   Subjective:    Hypertension  This is a chronic problem. The problem is unchanged. Associated symptoms include malaise/fatigue. Pertinent negatives include no anxiety, blurred vision, chest pain, headaches, neck pain, orthopnea, palpitations, peripheral edema, PND, shortness of breath or sweats. There are no associated agents to hypertension. Risk factors for coronary artery disease include male gender and obesity. There are no compliance problems.      Pt comes in today for clearance to go back to work.  He states he will return back to work full-time October 03, 2017.        Allergies  Allergen Reactions  . Oxycodone Other (See Comments)    Causes Hallucinations, auditory and tactile  . Isosorbide     Headache, nausea, vomiting     Current Outpatient Medications:  .  amLODipine (NORVASC) 10 MG tablet, TAKE 1 TABLET BY MOUTH EVERY DAY, Disp: 90 tablet, Rfl: 3 .  aspirin EC 81 MG tablet, Take 81 mg by mouth daily., Disp: , Rfl:  .  atorvastatin (LIPITOR) 40 MG tablet, Take 1 tablet (40 mg total) by mouth daily., Disp: 90 tablet, Rfl: 3 .  clobetasol ointment (TEMOVATE) 0.05 %, , Disp: , Rfl: 1 .  clonazePAM (KLONOPIN) 0.5 MG tablet, Take 0.5 mg by mouth 2 (two) times daily. , Disp: , Rfl:  .  EQUETRO 200 MG CP12 12 hr capsule, Take 400-600 mg by mouth 3 (three) times daily. , Disp: , Rfl: 7 .  Garlic 10 MG CAPS, Take by mouth daily., Disp: , Rfl:  .  hydrALAZINE (APRESOLINE) 100 MG tablet, Take 1 tablet (100 mg total) by mouth 3 (three) times daily., Disp: 270 tablet, Rfl: 3 .  ibuprofen (ADVIL,MOTRIN) 200 MG tablet, Take 800 mg by mouth 3 (three) times daily as needed for headache or moderate pain., Disp: , Rfl:  .  lamoTRIgine (LAMICTAL) 200 MG  tablet, Take 400 mg by mouth at bedtime. , Disp: , Rfl:  .  latanoprost (XALATAN) 0.005 % ophthalmic solution, Administer 1 drop to both eyes nightly., Disp: , Rfl:  .  losartan (COZAAR) 100 MG tablet, Take 1 tablet (100 mg total) by mouth daily., Disp: 90 tablet, Rfl: 3 .  LUMIGAN 0.01 % SOLN, INT 1 GTT IN OU QD IN THE EVE, Disp: , Rfl: 7 .  metoprolol tartrate (LOPRESSOR) 25 MG tablet, TAKE 1 TABLET(25 MG) BY MOUTH THREE TIMES DAILY, Disp: 90 tablet, Rfl: 3 .  NON FORMULARY, CPAP, Disp: , Rfl:  .  Omega-3 1000 MG CAPS, Take by mouth daily., Disp: , Rfl:  .  ondansetron (ZOFRAN-ODT) 8 MG disintegrating tablet, Take 1 tablet (8 mg total) by mouth every 8 (eight) hours as needed for nausea or vomiting., Disp: 90 tablet, Rfl: 0 .  pantoprazole (PROTONIX) 40 MG tablet, Take 1 tablet (40 mg total) by mouth every morning., Disp: 30 tablet, Rfl: 5 .  ranitidine (ZANTAC) 300 MG tablet, TAKE 1 TABLET(300 MG) BY MOUTH TWICE DAILY, Disp: 60 tablet, Rfl: 11 .  RUTIN PO, Take by mouth daily., Disp: , Rfl:  .  sertraline (ZOLOFT) 100 MG tablet, TAKE 1 TABLET BY MOUTH TWICE DAILY, Disp: 180 tablet, Rfl: 3 .  spironolactone (ALDACTONE) 50  MG tablet, Take 1 tablet (50 mg total) by mouth daily., Disp: 30 tablet, Rfl: 5 .  vitamin C (ASCORBIC ACID) 500 MG tablet, Take 500 mg by mouth daily., Disp: , Rfl:   Review of Systems  Constitutional: Positive for fatigue, malaise/fatigue and unexpected weight change. Negative for activity change, appetite change, chills, diaphoresis and fever.  HENT: Negative.   Eyes: Negative.  Negative for blurred vision.  Respiratory: Negative.  Negative for shortness of breath.   Cardiovascular: Negative.  Negative for chest pain, palpitations, orthopnea and PND.  Gastrointestinal: Negative.   Endocrine: Negative.   Musculoskeletal: Negative.  Negative for neck pain.  Allergic/Immunologic: Negative.   Neurological: Negative for headaches.  Hematological: Does not bruise/bleed  easily.  Psychiatric/Behavioral: Negative.     Social History   Tobacco Use  . Smoking status: Never Smoker  . Smokeless tobacco: Never Used  Substance Use Topics  . Alcohol use: Yes    Comment: occasionally   Objective:   BP (!) 144/72 (BP Location: Right Arm, Patient Position: Sitting, Cuff Size: Large)   Pulse 64   Temp 98 F (36.7 C) (Oral)   Resp 16   Wt 272 lb (123.4 kg)   BMI 42.60 kg/m  Vitals:   09/26/17 1107  BP: (!) 144/72  Pulse: 64  Resp: 16  Temp: 98 F (36.7 C)  TempSrc: Oral  Weight: 272 lb (123.4 kg)     Physical Exam  Constitutional: He is oriented to person, place, and time. He appears well-developed and well-nourished.  Pleasant WM NAD.  HENT:  Head: Normocephalic and atraumatic.  Right Ear: External ear normal.  Left Ear: External ear normal.  Nose: Nose normal.  Mouth/Throat: Oropharynx is clear and moist.  Eyes: Conjunctivae are normal. No scleral icterus.  Neck: No thyromegaly present.  Cardiovascular: Normal rate, regular rhythm and normal heart sounds.  Pulmonary/Chest: Effort normal and breath sounds normal.  Abdominal: Soft.  Neurological: He is alert and oriented to person, place, and time.  Skin: Skin is warm and dry.  Psychiatric: He has a normal mood and affect. His behavior is normal. Judgment and thought content normal.        Assessment & Plan:     HTN OSA ED MDD Obesity Began discuusion again of D and E habits as pt continues to recover from surgery 1 year ago. Pt cleared to return to work April 1. RTC 2 months.     I have done the exam and reviewed the above chart and it is accurate to the best of my knowledge. Development worker, community has been used in this note in any air is in the dictation or transcription are unintentional.  [name], MD  [facility]

## 2017-09-27 ENCOUNTER — Telehealth: Payer: Self-pay | Admitting: Urology

## 2017-09-27 DIAGNOSIS — F4320 Adjustment disorder, unspecified: Secondary | ICD-10-CM | POA: Diagnosis not present

## 2017-09-27 NOTE — Telephone Encounter (Signed)
Noted  

## 2017-09-27 NOTE — Telephone Encounter (Signed)
Patient's medication for trimix was never called in and when I tried to reschedule his appointment he wanted to first push it out til May because he would be going back to work next week and didn't want to miss work. He asked when he could pick up the medication. Then when I told him that the medication needed to be picked up the day before he said to just forget the whole thing and that her would see how he is doing later and may call back.  Sharyn Lull

## 2017-09-28 ENCOUNTER — Ambulatory Visit: Payer: BLUE CROSS/BLUE SHIELD | Admitting: Urology

## 2017-09-28 MED ORDER — METOPROLOL TARTRATE 50 MG PO TABS: 50.0000 mg | ORAL_TABLET | Freq: Two times a day (BID) | ORAL | 1 refills | Status: DC

## 2017-10-03 ENCOUNTER — Telehealth: Payer: Self-pay | Admitting: Family Medicine

## 2017-10-03 NOTE — Telephone Encounter (Signed)
Patient said he was told that a note would be in his "my chart" for him to go back to work today. However, the note is not there.  Can you do that today please?

## 2017-10-03 NOTE — Progress Notes (Signed)
Note for work

## 2017-10-03 NOTE — Telephone Encounter (Signed)
Spoke with pt about letter. Once note is sign pt wants it faxed to [phone].

## 2017-10-04 NOTE — Telephone Encounter (Signed)
Letter has been faxed to the number below.   

## 2017-10-10 ENCOUNTER — Other Ambulatory Visit: Payer: Self-pay

## 2017-10-10 ENCOUNTER — Ambulatory Visit (INDEPENDENT_AMBULATORY_CARE_PROVIDER_SITE_OTHER): Payer: BLUE CROSS/BLUE SHIELD | Admitting: Family Medicine

## 2017-10-10 ENCOUNTER — Telehealth: Payer: Self-pay | Admitting: Family Medicine

## 2017-10-10 VITALS — BP 152/76 | HR 62 | Temp 98.3°F | Resp 18 | Wt 276.0 lb

## 2017-10-10 DIAGNOSIS — J0100 Acute maxillary sinusitis, unspecified: Secondary | ICD-10-CM

## 2017-10-10 MED ORDER — AMOXICILLIN-POT CLAVULANATE 875-125 MG PO TABS: 1.0000 | ORAL_TABLET | Freq: Two times a day (BID) | ORAL | 0 refills | Status: DC

## 2017-10-10 NOTE — Progress Notes (Signed)
Peter [name]  MRN: [id number] DOB: [birth year]  Subjective:  HPI   The patient is a 57 year old male who presents for evaluation of sinusitis symptoms.   The patient starting having symptoms 3 days ago of scratchy throat, headache, sinus congestion, non productive cough.   He denies any fever, chest congestion or shortness of bvreath.  Patient Active Problem List   Diagnosis Date Noted  . Erectile dysfunction 09/14/2017  . Open abdominal incision with drainage   . 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 Necrotizing soft tissue infection 11/11/2016  . Decreased libido 09/21/2016  . Shortness of breath 06/15/2016  . Abnormal stress test 06/15/2016  . 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 Avitaminosis D 03/25/2015  . Blood in stool   . Clinical depression 06/21/2011  . Cannot sleep 08/22/2009  . Hypercholesterolemia without hypertriglyceridemia 01/28/2009  . Hay fever 10/22/2008  . Adjustment disorder with mixed anxiety and depressed mood 12/12/2007  . Essential (primary) hypertension 12/12/2007  . Apnea, sleep 12/12/2007    Past Medical History:  Diagnosis Date  . Blood in stool   . Difficult intubation   . Glaucoma    since 2004  . Hypertension    since 2000  . Obesity, unspecified   . Ulcer 2011   gastric  . Unspecified hemorrhoids without mention of complication     Social History   Socioeconomic History  . Marital status: Married    Spouse name: Not on file  . Number of children: Not on file  . Years of education: Not on file  . Highest education level: Not on file  Occupational History  . Not on file  Social Needs  . Financial resource strain: Not on file  . Food insecurity:    Worry: Not on file    Inability: Not on file  . Transportation needs:    Medical: Not on file    Non-medical: Not on file  Tobacco Use  . Smoking status: Never Smoker  . Smokeless tobacco: Never Used  Substance and Sexual Activity  . Alcohol use: Yes    Comment:  occasionally  . Drug use: No  . Sexual activity: Not on file  Lifestyle  . Physical activity:    Days per week: Not on file    Minutes per session: Not on file  . Stress: Not on file  Relationships  . Social connections:    Talks on phone: Not on file    Gets together: Not on file    Attends religious service: Not on file    Active member of club or organization: Not on file    Attends meetings of clubs or organizations: Not on file    Relationship status: Not on file  . Intimate partner violence:    Fear of current or ex partner: Not on file    Emotionally abused: Not on file    Physically abused: Not on file    Forced sexual activity: Not on file  Other Topics Concern  . Not on file  Social History Narrative  . Not on file    Outpatient Encounter Medications as of 10/10/2017  Medication Sig  . amLODipine (NORVASC) 10 MG tablet TAKE 1 TABLET BY MOUTH EVERY DAY  . aspirin EC 81 MG tablet Take 81 mg by mouth daily.  Marland Kitchen atorvastatin (LIPITOR) 40 MG tablet Take 1 tablet (40 mg total) by mouth daily.  . clobetasol ointment (TEMOVATE) 0.05 %   . clonazePAM (KLONOPIN) 0.5 MG tablet Take 0.5  mg by mouth 2 (two) times daily.   Marland Kitchen EQUETRO 200 MG CP12 12 hr capsule Take 400-600 mg by mouth 3 (three) times daily.   . Garlic 10 MG CAPS Take by mouth daily.  . hydrALAZINE (APRESOLINE) 100 MG tablet Take 1 tablet (100 mg total) by mouth 3 (three) times daily.  Marland Kitchen ibuprofen (ADVIL,MOTRIN) 200 MG tablet Take 800 mg by mouth 3 (three) times daily as needed for headache or moderate pain.  Marland Kitchen lamoTRIgine (LAMICTAL) 200 MG tablet Take 400 mg by mouth at bedtime.   Marland Kitchen latanoprost (XALATAN) 0.005 % ophthalmic solution Administer 1 drop to both eyes nightly.  Marland Kitchen losartan (COZAAR) 100 MG tablet Take 1 tablet (100 mg total) by mouth daily.  Marland Kitchen LUMIGAN 0.01 % SOLN INT 1 GTT IN OU QD IN THE EVE  . metoprolol tartrate (LOPRESSOR) 50 MG tablet Take 1 tablet (50 mg total) by mouth 2 (two) times daily.  . NON  FORMULARY CPAP  . Omega-3 1000 MG CAPS Take by mouth daily.  . ondansetron (ZOFRAN-ODT) 8 MG disintegrating tablet Take 1 tablet (8 mg total) by mouth every 8 (eight) hours as needed for nausea or vomiting.  . pantoprazole (PROTONIX) 40 MG tablet Take 1 tablet (40 mg total) by mouth every morning.  . ranitidine (ZANTAC) 300 MG tablet TAKE 1 TABLET(300 MG) BY MOUTH TWICE DAILY  . RUTIN PO Take by mouth daily.  . sertraline (ZOLOFT) 100 MG tablet TAKE 1 TABLET BY MOUTH TWICE DAILY  . spironolactone (ALDACTONE) 50 MG tablet Take 1 tablet (50 mg total) by mouth daily.  . vitamin C (ASCORBIC ACID) 500 MG tablet Take 500 mg by mouth daily.  . [DISCONTINUED] metoprolol tartrate (LOPRESSOR) 25 MG tablet TAKE 1 TABLET(25 MG) BY MOUTH THREE TIMES DAILY   No facility-administered encounter medications on file as of 10/10/2017.     Allergies  Allergen Reactions  . Oxycodone Other (See Comments)    Causes Hallucinations, auditory and tactile  . Isosorbide     Headache, nausea, vomiting    Review of Systems  Constitutional: Positive for malaise/fatigue. Negative for chills, diaphoresis and fever.  HENT: Positive for congestion and sinus pain. Negative for ear discharge, ear pain, hearing loss, nosebleeds and tinnitus.   Eyes: Negative for blurred vision, double vision, photophobia, pain, discharge and redness.  Respiratory: Positive for cough. Negative for hemoptysis, sputum production, shortness of breath and wheezing.   Cardiovascular: Negative for chest pain, palpitations, orthopnea, claudication and leg swelling.  Gastrointestinal: Negative.   Endo/Heme/Allergies: Negative.   Psychiatric/Behavioral: Negative.     Objective:  BP (!) 152/76 (BP Location: Right Arm, Patient Position: Sitting, Cuff Size: Large)   Pulse 62   Temp 98.3 F (36.8 C) (Oral)   Resp 18   Wt 276 lb (125.2 kg)   SpO2 97%   BMI 43.23 kg/m   Physical Exam  Constitutional: He is oriented to person, place, and time  and well-developed, well-nourished, and in no distress.  HENT:  Head: Normocephalic and atraumatic.  Eyes: Conjunctivae are normal.  Neck: No thyromegaly present.  Cardiovascular: Normal rate, regular rhythm and normal heart sounds.  Pulmonary/Chest: Effort normal and breath sounds normal.  Abdominal: Soft.  Neurological: He is alert and oriented to person, place, and time. Gait normal. GCS score is 15.  Skin: Skin is warm and dry.  Psychiatric: Mood, memory, affect and judgment normal.    Assessment and Plan :  1. Acute maxillary sinusitis, recurrence not specified  - amoxicillin-clavulanate (AUGMENTIN)  875-125 MG tablet; Take 1 tablet by mouth 2 (two) times daily.  Dispense: 20 tablet; Refill: 0  I have done the exam and reviewed the chart and it is accurate to the best of my knowledge. Development worker, community has been used and  any errors in dictation or transcription are unintentional. Miguel Aschoff M.D. [facility]

## 2017-10-10 NOTE — Telephone Encounter (Signed)
Faxed OV 03/21/17,02/16/17,04/28/17 on 04/27/2017 [facility]

## 2017-10-26 DIAGNOSIS — F4320 Adjustment disorder, unspecified: Secondary | ICD-10-CM | POA: Diagnosis not present

## 2017-11-02 ENCOUNTER — Ambulatory Visit: Payer: BLUE CROSS/BLUE SHIELD | Admitting: Urology

## 2017-11-09 DIAGNOSIS — F4320 Adjustment disorder, unspecified: Secondary | ICD-10-CM | POA: Diagnosis not present

## 2017-11-14 DIAGNOSIS — F4320 Adjustment disorder, unspecified: Secondary | ICD-10-CM | POA: Diagnosis not present

## 2017-11-24 DIAGNOSIS — F4320 Adjustment disorder, unspecified: Secondary | ICD-10-CM | POA: Diagnosis not present

## 2017-11-30 ENCOUNTER — Ambulatory Visit: Payer: BLUE CROSS/BLUE SHIELD | Admitting: Family Medicine

## 2017-12-01 DIAGNOSIS — F4320 Adjustment disorder, unspecified: Secondary | ICD-10-CM | POA: Diagnosis not present

## 2017-12-03 ENCOUNTER — Other Ambulatory Visit: Payer: Self-pay | Admitting: Family Medicine

## 2017-12-06 ENCOUNTER — Encounter: Payer: Self-pay | Admitting: Family Medicine

## 2017-12-06 ENCOUNTER — Ambulatory Visit (INDEPENDENT_AMBULATORY_CARE_PROVIDER_SITE_OTHER): Payer: BLUE CROSS/BLUE SHIELD | Admitting: Family Medicine

## 2017-12-06 VITALS — BP 138/80 | HR 64 | Temp 98.2°F | Resp 16 | Wt 283.0 lb

## 2017-12-06 DIAGNOSIS — Z23 Encounter for immunization: Secondary | ICD-10-CM | POA: Diagnosis not present

## 2017-12-06 DIAGNOSIS — K352 Acute appendicitis with generalized peritonitis, without abscess: Secondary | ICD-10-CM

## 2017-12-06 DIAGNOSIS — E291 Testicular hypofunction: Secondary | ICD-10-CM

## 2017-12-06 DIAGNOSIS — H524 Presbyopia: Secondary | ICD-10-CM | POA: Diagnosis not present

## 2017-12-06 DIAGNOSIS — H52221 Regular astigmatism, right eye: Secondary | ICD-10-CM | POA: Diagnosis not present

## 2017-12-06 DIAGNOSIS — G4733 Obstructive sleep apnea (adult) (pediatric): Secondary | ICD-10-CM | POA: Diagnosis not present

## 2017-12-06 DIAGNOSIS — I10 Essential (primary) hypertension: Secondary | ICD-10-CM

## 2017-12-06 DIAGNOSIS — H401131 Primary open-angle glaucoma, bilateral, mild stage: Secondary | ICD-10-CM | POA: Diagnosis not present

## 2017-12-06 DIAGNOSIS — H5203 Hypermetropia, bilateral: Secondary | ICD-10-CM | POA: Diagnosis not present

## 2017-12-06 NOTE — Progress Notes (Signed)
Patient: Peter [name] Male    DOB: [birth year]   57 y.o.   MRN: [id number] Visit Date: 12/06/2017  Today's Provider: [name], MD   Chief Complaint  Patient presents with  . Hypertension  . Depression   Subjective:    HPI  Hypertension, follow-up:  BP Readings from Last 3 Encounters:  12/06/17 138/80  10/10/17 (!) 152/76  09/26/17 (!) 144/72    He was last seen for hypertension 2 months ago.  BP at that visit was 152/76. Management since that visit includes none. He reports good compliance with treatment. He is not having side effects.  He is exercising. 3 times a week. He is adherent to low salt diet.   Outside blood pressures are 130's/80-90's. Patient denies chest pain, chest pressure/discomfort, claudication, dyspnea, exertional chest pressure/discomfort, fatigue, irregular heart beat, lower extremity edema, near-syncope, orthopnea, palpitations, paroxysmal nocturnal dyspnea, syncope and tachypnea.   C Wt Readings from Last 3 Encounters:  12/06/17 283 lb (128.4 kg)  10/10/17 276 lb (125.2 kg)  09/26/17 272 lb (123.4 kg)   ------------------------------------------------------------------------ Pt reports that he has been feeling well. He started work back and that is going well. He still hav some weakness in the left leg. He is going to the gym 3 times a week at least.      Allergies  Allergen Reactions  . Oxycodone Other (See Comments)    Causes Hallucinations, auditory and tactile  . Isosorbide     Headache, nausea, vomiting     Current Outpatient Medications:  .  amLODipine (NORVASC) 10 MG tablet, TAKE 1 TABLET BY MOUTH EVERY DAY, Disp: 90 tablet, Rfl: 3 .  aspirin EC 81 MG tablet, Take 81 mg by mouth daily., Disp: , Rfl:  .  clobetasol ointment (TEMOVATE) 0.05 %, , Disp: , Rfl: 1 .  clonazePAM (KLONOPIN) 0.5 MG tablet, Take 0.5 mg by mouth 2 (two) times daily. , Disp: , Rfl:  .  EQUETRO 200 MG CP12 12 hr capsule, Take 400-600 mg by  mouth 3 (three) times daily. , Disp: , Rfl: 7 .  Garlic 10 MG CAPS, Take by mouth daily., Disp: , Rfl:  .  hydrALAZINE (APRESOLINE) 100 MG tablet, Take 1 tablet (100 mg total) by mouth 3 (three) times daily., Disp: 270 tablet, Rfl: 3 .  ibuprofen (ADVIL,MOTRIN) 200 MG tablet, Take 800 mg by mouth 3 (three) times daily as needed for headache or moderate pain., Disp: , Rfl:  .  lamoTRIgine (LAMICTAL) 200 MG tablet, Take 400 mg by mouth at bedtime. , Disp: , Rfl:  .  latanoprost (XALATAN) 0.005 % ophthalmic solution, Administer 1 drop to both eyes nightly., Disp: , Rfl:  .  losartan (COZAAR) 100 MG tablet, Take 1 tablet (100 mg total) by mouth daily., Disp: 90 tablet, Rfl: 3 .  LUMIGAN 0.01 % SOLN, INT 1 GTT IN OU QD IN THE EVE, Disp: , Rfl: 7 .  metoprolol tartrate (LOPRESSOR) 50 MG tablet, Take 1 tablet (50 mg total) by mouth 2 (two) times daily., Disp: 180 tablet, Rfl: 1 .  NON FORMULARY, CPAP, Disp: , Rfl:  .  Omega-3 1000 MG CAPS, Take by mouth daily., Disp: , Rfl:  .  ondansetron (ZOFRAN-ODT) 8 MG disintegrating tablet, Take 1 tablet (8 mg total) by mouth every 8 (eight) hours as needed for nausea or vomiting., Disp: 90 tablet, Rfl: 0 .  pantoprazole (PROTONIX) 40 MG tablet, TAKE 1 TABLET(40 MG) BY MOUTH EVERY MORNING,  Disp: 30 tablet, Rfl: 0 .  ranitidine (ZANTAC) 300 MG tablet, TAKE 1 TABLET(300 MG) BY MOUTH TWICE DAILY, Disp: 60 tablet, Rfl: 11 .  RUTIN PO, Take by mouth daily., Disp: , Rfl:  .  sertraline (ZOLOFT) 100 MG tablet, TAKE 1 TABLET BY MOUTH TWICE DAILY, Disp: 180 tablet, Rfl: 3 .  spironolactone (ALDACTONE) 50 MG tablet, TAKE 1 TABLET(50 MG) BY MOUTH DAILY, Disp: 30 tablet, Rfl: 11 .  vitamin C (ASCORBIC ACID) 500 MG tablet, Take 500 mg by mouth daily., Disp: , Rfl:  .  amoxicillin-clavulanate (AUGMENTIN) 875-125 MG tablet, Take 1 tablet by mouth 2 (two) times daily. (Patient not taking: Reported on 12/06/2017), Disp: 20 tablet, Rfl: 0 .  atorvastatin (LIPITOR) 40 MG tablet, Take 1  tablet (40 mg total) by mouth daily., Disp: 90 tablet, Rfl: 3  Review of Systems  Constitutional: Negative.   HENT: Negative.   Eyes: Negative.   Respiratory: Negative.   Cardiovascular: Negative.   Gastrointestinal: Negative.   Endocrine: Negative.   Genitourinary: Negative.   Musculoskeletal: Negative.   Skin: Negative.   Allergic/Immunologic: Negative.   Neurological: Positive for weakness.  Hematological: Negative.   Psychiatric/Behavioral: Negative.     Social History   Tobacco Use  . Smoking status: Never Smoker  . Smokeless tobacco: Never Used  Substance Use Topics  . Alcohol use: Yes    Comment: occasionally   Objective:   BP 138/80 (BP Location: Right Arm, Patient Position: Sitting, Cuff Size: Large)   Pulse 64   Temp 98.2 F (36.8 C) (Oral)   Resp 16   Wt 283 lb (128.4 kg)   SpO2 96%   BMI 44.32 kg/m  Vitals:   12/06/17 1537  BP: 138/80  Pulse: 64  Resp: 16  Temp: 98.2 F (36.8 C)  TempSrc: Oral  SpO2: 96%  Weight: 283 lb (128.4 kg)     Physical Exam  Constitutional: He is oriented to person, place, and time. He appears well-developed and well-nourished.  HENT:  Head: Normocephalic and atraumatic.  Right Ear: External ear normal.  Left Ear: External ear normal.  Nose: Nose normal.  Eyes: Conjunctivae are normal. No scleral icterus.  Neck: No thyromegaly present.  Cardiovascular: Normal rate, regular rhythm and normal heart sounds.  Pulmonary/Chest: Effort normal and breath sounds normal.  Abdominal:  Chronic midline firm mass present since hospital discharge.  Musculoskeletal: He exhibits edema.  Trace edema.  Neurological: He is alert and oriented to person, place, and time.  Skin: Skin is warm and dry.  Psychiatric: He has a normal mood and affect. His behavior is normal. Judgment and thought content normal.        Assessment & Plan:     1. Need for zoster vaccine - Varicella-zoster vaccine IM  2. Essential (primary)  hypertension  - Renal function panel 3.CKD 4.Obesity 5.Morbid Obesity     I have done the exam and reviewed the above chart and it is accurate to the best of my knowledge. Development worker, community has been used in this note in any air is in the dictation or transcription are unintentional.  [name], MD  [place]

## 2017-12-07 LAB — RENAL FUNCTION PANEL
ALBUMIN: 4.2 g/dL (ref 3.5–5.5)
BUN/Creatinine Ratio: 24 — ABNORMAL HIGH (ref 9–20)
BUN: 23 mg/dL (ref 6–24)
CALCIUM: 9 mg/dL (ref 8.7–10.2)
CO2: 24 mmol/L (ref 20–29)
CREATININE: 0.97 mg/dL (ref 0.76–1.27)
Chloride: 103 mmol/L (ref 96–106)
GFR calc Af Amer: 98 mL/min/{1.73_m2} (ref >59)
GFR calc non Af Amer: 85 mL/min/{1.73_m2} (ref >59)
Glucose: 84 mg/dL (ref 65–99)
PHOSPHORUS: 3.8 mg/dL (ref 2.5–4.5)
Potassium: 4 mmol/L (ref 3.5–5.2)
Sodium: 141 mmol/L (ref 134–144)

## 2017-12-12 DIAGNOSIS — F4320 Adjustment disorder, unspecified: Secondary | ICD-10-CM | POA: Diagnosis not present

## 2017-12-19 DIAGNOSIS — F4320 Adjustment disorder, unspecified: Secondary | ICD-10-CM | POA: Diagnosis not present

## 2017-12-29 DIAGNOSIS — F4320 Adjustment disorder, unspecified: Secondary | ICD-10-CM | POA: Diagnosis not present

## 2018-01-04 DIAGNOSIS — F4320 Adjustment disorder, unspecified: Secondary | ICD-10-CM | POA: Diagnosis not present

## 2018-01-09 DIAGNOSIS — F4320 Adjustment disorder, unspecified: Secondary | ICD-10-CM | POA: Diagnosis not present

## 2018-01-23 DIAGNOSIS — F4320 Adjustment disorder, unspecified: Secondary | ICD-10-CM | POA: Diagnosis not present

## 2018-01-27 ENCOUNTER — Other Ambulatory Visit: Payer: Self-pay | Admitting: Family Medicine

## 2018-02-06 DIAGNOSIS — F4320 Adjustment disorder, unspecified: Secondary | ICD-10-CM | POA: Diagnosis not present

## 2018-02-09 ENCOUNTER — Ambulatory Visit (INDEPENDENT_AMBULATORY_CARE_PROVIDER_SITE_OTHER): Payer: BLUE CROSS/BLUE SHIELD | Admitting: Family Medicine

## 2018-02-09 DIAGNOSIS — Z23 Encounter for immunization: Secondary | ICD-10-CM | POA: Diagnosis not present

## 2018-02-10 NOTE — Progress Notes (Signed)
Here for a CMA visit for Shingrix vaccine only

## 2018-02-13 DIAGNOSIS — Z79899 Other long term (current) drug therapy: Secondary | ICD-10-CM | POA: Diagnosis not present

## 2018-02-14 ENCOUNTER — Ambulatory Visit: Payer: Self-pay | Admitting: Family Medicine

## 2018-02-20 DIAGNOSIS — F3175 Bipolar disorder, in partial remission, most recent episode depressed: Secondary | ICD-10-CM | POA: Diagnosis not present

## 2018-02-20 DIAGNOSIS — F4320 Adjustment disorder, unspecified: Secondary | ICD-10-CM | POA: Diagnosis not present

## 2018-02-24 ENCOUNTER — Other Ambulatory Visit: Payer: Self-pay | Admitting: Family Medicine

## 2018-03-07 DIAGNOSIS — F4320 Adjustment disorder, unspecified: Secondary | ICD-10-CM | POA: Diagnosis not present

## 2018-03-18 ENCOUNTER — Other Ambulatory Visit: Payer: Self-pay | Admitting: Family Medicine

## 2018-04-12 ENCOUNTER — Ambulatory Visit: Payer: Self-pay | Admitting: Family Medicine

## 2018-05-19 ENCOUNTER — Other Ambulatory Visit: Payer: Self-pay | Admitting: Family Medicine

## 2018-06-06 ENCOUNTER — Ambulatory Visit (INDEPENDENT_AMBULATORY_CARE_PROVIDER_SITE_OTHER): Payer: BLUE CROSS/BLUE SHIELD | Admitting: Family Medicine

## 2018-06-06 ENCOUNTER — Encounter: Payer: Self-pay | Admitting: Family Medicine

## 2018-06-06 VITALS — BP 155/89 | HR 65 | Temp 98.2°F | Resp 16 | Ht 67.0 in | Wt 304.6 lb

## 2018-06-06 DIAGNOSIS — I10 Essential (primary) hypertension: Secondary | ICD-10-CM | POA: Diagnosis not present

## 2018-06-06 DIAGNOSIS — Z23 Encounter for immunization: Secondary | ICD-10-CM

## 2018-06-06 DIAGNOSIS — Z125 Encounter for screening for malignant neoplasm of prostate: Secondary | ICD-10-CM

## 2018-06-06 DIAGNOSIS — N3281 Overactive bladder: Secondary | ICD-10-CM

## 2018-06-06 DIAGNOSIS — Z0000 Encounter for general adult medical examination without abnormal findings: Secondary | ICD-10-CM | POA: Diagnosis not present

## 2018-06-06 DIAGNOSIS — E7800 Pure hypercholesterolemia, unspecified: Secondary | ICD-10-CM | POA: Diagnosis not present

## 2018-06-06 DIAGNOSIS — G4733 Obstructive sleep apnea (adult) (pediatric): Secondary | ICD-10-CM

## 2018-06-06 DIAGNOSIS — G629 Polyneuropathy, unspecified: Secondary | ICD-10-CM

## 2018-06-06 DIAGNOSIS — Z9989 Dependence on other enabling machines and devices: Secondary | ICD-10-CM

## 2018-06-06 LAB — POCT URINALYSIS DIPSTICK
Bilirubin, UA: NEGATIVE
Glucose, UA: NEGATIVE
Ketones, UA: NEGATIVE
Leukocytes, UA: NEGATIVE
Nitrite, UA: NEGATIVE
Protein, UA: NEGATIVE
RBC UA: NEGATIVE
Spec Grav, UA: 1.01 (ref 1.010–1.025)
Urobilinogen, UA: 0.2 E.U./dL
pH, UA: 6 (ref 5.0–8.0)

## 2018-06-06 MED ORDER — TOLTERODINE TARTRATE ER 4 MG PO CP24: 4.0000 mg | ORAL_CAPSULE | Freq: Every day | ORAL | 12 refills | Status: DC

## 2018-06-06 NOTE — Progress Notes (Signed)
Patient: Peter [name], Male    DOB: [birth year], 59 y.o.   MRN: [id number] Visit Date: 06/06/2018  Today's Provider: [name], MD   Chief Complaint  Patient presents with  . Annual Exam   Subjective:    Annual physical exam Peter [name] is a 57 y.o. male who presents today for health maintenance and complete physical. He feels well. He reports he is not actively exercising. He reports he is sleeping fairly well. He is recovering slowly from his severe illness from a year ago.  He is now back to work full-time.  He is walking with a cane.  No falls.  Does feel weak.  Neatly he has had pain with weightbearing down his left lateral upper leg.  His only other complaint is some significant urgency and maybe a little  urge incontinence.  He does wear a depends. Last reported  Tdap- 04/18/16 Shingrix- 12/06/17, 02/09/18 Colonoscopy 01/12/17 PSA-06/01/17 Screening labs- Due    Review of Systems  Constitutional: Negative.   HENT: Negative.   Eyes: Negative.   Respiratory: Negative.   Cardiovascular: Negative.   Endocrine: Positive for polyuria.  Genitourinary: Positive for enuresis.  Musculoskeletal: Positive for arthralgias and back pain.  Skin: Negative.   Neurological: Negative.   Hematological: Negative.   Psychiatric/Behavioral: Negative.     Social History He  reports that he has never smoked. He has never used smokeless tobacco. He reports that he drinks alcohol. He reports that he does not use drugs. Social History   Socioeconomic History  . Marital status: Married    Spouse name: Not on file  . Number of children: Not on file  . Years of education: Not on file  . Highest education level: Not on file  Occupational History  . Not on file  Social Needs  . Financial resource strain: Not on file  . Food insecurity:    Worry: Not on file    Inability: Not on file  . Transportation needs:    Medical: Not on file    Non-medical: Not on file    Tobacco Use  . Smoking status: Never Smoker  . Smokeless tobacco: Never Used  Substance and Sexual Activity  . Alcohol use: Yes    Comment: occasionally  . Drug use: No  . Sexual activity: Not on file  Lifestyle  . Physical activity:    Days per week: Not on file    Minutes per session: Not on file  . Stress: Not on file  Relationships  . Social connections:    Talks on phone: Not on file    Gets together: Not on file    Attends religious service: Not on file    Active member of club or organization: Not on file    Attends meetings of clubs or organizations: Not on file    Relationship status: Not on file  Other Topics Concern  . Not on file  Social History Narrative  . Not on file    Patient Active Problem List   Diagnosis Date Noted  . Erectile dysfunction 09/14/2017  . Open abdominal incision with drainage   . 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 Necrotizing soft tissue infection 11/11/2016  . Decreased libido 09/21/2016  . Shortness of breath 06/15/2016  . Abnormal stress test 06/15/2016  . 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 Avitaminosis D 03/25/2015  . Blood in stool   . Clinical depression 06/21/2011  . Cannot sleep 08/22/2009  .  Hypercholesterolemia without hypertriglyceridemia 01/28/2009  . Hay fever 10/22/2008  . Adjustment disorder with mixed anxiety and depressed mood 12/12/2007  . Essential (primary) hypertension 12/12/2007  . Apnea, sleep 12/12/2007    Past Surgical History:  Procedure Laterality Date  . APPLICATION OF WOUND VAC N/A 11/16/2016   Procedure: APPLICATION OF WOUND VAC-ABDOMINAL;  Surgeon: Florene Glen, MD;  Location: [facility];  Service: General;  Laterality: N/A;  . APPLICATION OF WOUND VAC N/A 11/18/2016   Procedure: APPLICATION OF WOUND VAC;  Surgeon: Florene Glen, MD;  Location: [facility];  Service: General;  Laterality: N/A;  change  . APPLICATION OF WOUND VAC N/A 11/23/2016   Procedure: WOUND VAC CHANGE;  Surgeon:  Olean Ree, MD;  Location: [facility];  Service: General;  Laterality: N/A;  wound vac application  . CARDIAC CATHETERIZATION Left 06/23/2016   Procedure: Left Heart Cath and Coronary Angiography;  Surgeon: Nelva Bush, MD;  Location: [place] [facility];  Service: Cardiovascular;  Laterality: Left;  . CHOLECYSTECTOMY  1992  . COLONOSCOPY W/ POLYPECTOMY  05/22/2010   65mmtransverse colon polyp, traditional serrated adenoma,negative for high grade dysplasia & malignancy. rectal polyp-43mm,negative for dysplasia & malignancy.  Marland Kitchen DRESSING CHANGE UNDER ANESTHESIA N/A 11/21/2016   Procedure: DRESSING CHANGE UNDER ANESTHESIA;  Surgeon: Florene Glen, MD;  Location: [facility];  Service: General;  Laterality: N/A;  . KNEE SURGERY Left 2010  . LAPAROSCOPIC APPENDECTOMY N/A 10/30/2016   Procedure: APPENDECTOMY LAPAROSCOPIC changed  to open application of wound vac;  Surgeon: [name], MD;  Location: [facility];  Service: General;  Laterality: N/A;  . LAPAROTOMY N/A 11/11/2016   Procedure: EXPLORATORY LAPAROTOMY, DEBRIDEMENT OF ABDOMINAL WOUND, ABDOMINAL [place];  Surgeon: [name], MD;  Location: [facility];  Service: General;  Laterality: N/A;  . LAPAROTOMY N/A 11/12/2016   Procedure: EXPLORATORY LAPAROTOMY;  Surgeon: Clayburn Pert, MD;  Location: [facility];  Service: General;  Laterality: N/A;  . LAPAROTOMY N/A 11/14/2016   Procedure: EXPLORATORY LAPAROTOMY; Irrigation, partial closure;  Surgeon: Clayburn Pert, MD;  Location: [facility];  Service: General;  Laterality: N/A;    Family History  Family Status  Relation Name Status  . Other  Deceased  . Mother  Deceased  . Father  Deceased  . Sister  Alive  . Sister  Alive   His family history includes Cancer in his other; Hyperlipidemia in his mother; Hypertension in his mother.     Allergies  Allergen Reactions  . Oxycodone Other (See Comments)    Causes Hallucinations, auditory and tactile  . Isosorbide     Headache, nausea,  vomiting    Previous Medications   AMLODIPINE (NORVASC) 10 MG TABLET    TAKE 1 TABLET BY MOUTH EVERY DAY   ASPIRIN EC 81 MG TABLET    Take 81 mg by mouth daily.   ATORVASTATIN (LIPITOR) 40 MG TABLET    Take 1 tablet (40 mg total) by mouth daily.   ATORVASTATIN (LIPITOR) 40 MG TABLET       CLOBETASOL OINTMENT (TEMOVATE) 0.05 %       CLONAZEPAM (KLONOPIN) 0.5 MG TABLET    Take 0.5 mg by mouth 2 (two) times daily.    GARLIC 10 MG CAPS    Take by mouth daily.   HYDRALAZINE (APRESOLINE) 100 MG TABLET    Take 1 tablet (100 mg total) by mouth 3 (three) times daily.   IBUPROFEN (ADVIL,MOTRIN) 200 MG TABLET    Take 800 mg by mouth 3 (three)  times daily as needed for headache or moderate pain.   LAMOTRIGINE (LAMICTAL) 200 MG TABLET    Take 400 mg by mouth at bedtime.    LOSARTAN (COZAAR) 100 MG TABLET    Take 1 tablet (100 mg total) by mouth daily.   LUMIGAN 0.01 % SOLN    INT 1 GTT IN OU QD IN THE EVE   METOPROLOL TARTRATE (LOPRESSOR) 50 MG TABLET    TAKE 1 TABLET(50 MG) BY MOUTH TWICE DAILY   NON FORMULARY    CPAP   OMEGA-3 1000 MG CAPS    Take by mouth daily.   PANTOPRAZOLE (PROTONIX) 40 MG TABLET    TAKE 1 TABLET(40 MG) BY MOUTH EVERY MORNING   RANITIDINE (ZANTAC) 300 MG TABLET    TAKE 1 TABLET(300 MG) BY MOUTH TWICE DAILY   RUTIN PO    Take by mouth daily.   SERTRALINE (ZOLOFT) 100 MG TABLET    TAKE 1 TABLET BY MOUTH TWICE DAILY   SPIRONOLACTONE (ALDACTONE) 50 MG TABLET    TAKE 1 TABLET(50 MG) BY MOUTH DAILY   VITAMIN C (ASCORBIC ACID) 500 MG TABLET    Take 500 mg by mouth daily.    Patient Care Team: Jerrol Banana., MD as PCP - General (Family Medicine)      Objective:   Vitals: BP (!) 155/89   Pulse 65   Temp 98.2 F (36.8 C) (Oral)   Resp 16   Ht 5\' 7"  (1.702 m)   Wt (!) 304 lb 9.6 oz (138.2 kg)   SpO2 98%   BMI 47.71 kg/m    Physical Exam  Constitutional: He is oriented to person, place, and time. He appears well-developed and well-nourished.  Obese white male in  no acute distress  HENT:  Head: Normocephalic and atraumatic.  Right Ear: External ear normal.  Left Ear: External ear normal.  Nose: Nose normal.  Mouth/Throat: Oropharynx is clear and moist.  Eyes: Conjunctivae are normal. No scleral icterus.  Neck: No thyromegaly present.  Scar lower anterior neck from tracheostomy site  Cardiovascular: Normal rate, regular rhythm, normal heart sounds and intact distal pulses.  Pulmonary/Chest: Effort normal and breath sounds normal.  Abdominal: Bowel sounds are normal. He exhibits no distension. There is no tenderness.  Midline scar is surrounded by can very firm scar tissue probably 3 inches on either side of the scar.  Musculoskeletal: He exhibits no edema.  Neurological: He is alert and oriented to person, place, and time. No cranial nerve deficit or sensory deficit. He exhibits normal muscle tone.  Skin: Skin is warm and dry.  Psychiatric: He has a normal mood and affect. His behavior is normal. Judgment and thought content normal.     Depression Screen PHQ 2/9 Scores 06/06/2018 09/21/2016 03/25/2015  PHQ - 2 Score 0 0 0  PHQ- 9 Score 1 3 -   Urinalysis    Component Value Date/Time   COLORURINE YELLOW (A) 06/02/2017 1648   APPEARANCEUR CLEAR (A) 06/02/2017 1648   LABSPEC 1.016 06/02/2017 1648   PHURINE 6.0 06/02/2017 1648   GLUCOSEU NEGATIVE 06/02/2017 1648   HGBUR NEGATIVE 06/02/2017 1648   BILIRUBINUR negative 06/06/2018 1003   KETONESUR NEGATIVE 06/02/2017 1648   PROTEINUR Negative 06/06/2018 1003   PROTEINUR 30 (A) 06/02/2017 1648   UROBILINOGEN 0.2 06/06/2018 1003   NITRITE negative 06/06/2018 1003   NITRITE NEGATIVE 06/02/2017 1648   LEUKOCYTESUR Negative 06/06/2018 1003      Assessment & Plan:     Routine  Health Maintenance and Physical Exam  Exercise Activities and Dietary recommendations Goals   None     Immunization History  Administered Date(s) Administered  . Influenza Split 04/16/2008, 04/08/2009,  03/04/2010, 04/06/2011  . Influenza,inj,Quad PF,6+ Mos 04/21/2013, 03/19/2014, 03/25/2015, 05/04/2016, 05/31/2017  . Tdap 04/06/2011, 04/18/2016  . Zoster Recombinat (Shingrix) 12/06/2017, 02/09/2018    Health Maintenance  Topic Date Due  . INFLUENZA VACCINE  02/02/2018  . TETANUS/TDAP  04/18/2026  . COLONOSCOPY  01/11/2027  . Hepatitis C Screening  Completed  . HIV Screening  Completed     Discussed health benefits of physical activity, and encouraged him to engage in regular exercise appropriate for his age and condition.  1. Essential (primary) hypertension  - Comprehensive metabolic panel - TSH - CBC with Differential/Platelet  2. Screening for prostate cancer   3. Hypercholesterolemia without hypertriglyceridemia  - Lipid panel - PSA  4. Need for influenza vaccination  - Flu Vaccine QUAD 36+ mos IM  5. Annual physical exam  - POCT urinalysis dipstick  6. Overactive bladder Carefully try Detrol LA.  Return to clinic 1 to 2 months. - tolterodine (DETROL LA) 4 MG 24 hr capsule; Take 1 capsule (4 mg total) by mouth daily.  Dispense: 30 capsule; Refill: 12  7. Neuropathy Mainly just distal toes. - B12 8.  Left leg pain This could be sciatica.  It could be left trochanteric bursitis.  May need Ortho referral. 9.  OSA with CPAP   -------------------------------------------------------------------- I have done the exam and reviewed the chart and it is accurate to the best of my knowledge. Development worker, community has been used and  any errors in dictation or transcription are unintentional. Miguel Aschoff M.D. [facility]

## 2018-06-07 ENCOUNTER — Telehealth: Payer: Self-pay

## 2018-06-07 LAB — COMPREHENSIVE METABOLIC PANEL
A/G RATIO: 1.4 (ref 1.2–2.2)
ALBUMIN: 4 g/dL (ref 3.5–5.5)
ALT: 22 IU/L (ref 0–44)
AST: 27 IU/L (ref 0–40)
Alkaline Phosphatase: 144 IU/L — ABNORMAL HIGH (ref 39–117)
BUN / CREAT RATIO: 25 — AB (ref 9–20)
BUN: 24 mg/dL (ref 6–24)
Bilirubin Total: 0.4 mg/dL (ref 0.0–1.2)
CALCIUM: 9.1 mg/dL (ref 8.7–10.2)
CO2: 22 mmol/L (ref 20–29)
CREATININE: 0.96 mg/dL (ref 0.76–1.27)
Chloride: 108 mmol/L — ABNORMAL HIGH (ref 96–106)
GFR, EST AFRICAN AMERICAN: 100 mL/min/{1.73_m2} (ref >59)
GFR, EST NON AFRICAN AMERICAN: 86 mL/min/{1.73_m2} (ref >59)
GLOBULIN, TOTAL: 2.9 g/dL (ref 1.5–4.5)
Glucose: 89 mg/dL (ref 65–99)
POTASSIUM: 3.9 mmol/L (ref 3.5–5.2)
SODIUM: 142 mmol/L (ref 134–144)
TOTAL PROTEIN: 6.9 g/dL (ref 6.0–8.5)

## 2018-06-07 LAB — CBC WITH DIFFERENTIAL/PLATELET
Basophils Absolute: 0 10*3/uL (ref 0.0–0.2)
Basos: 0 %
EOS (ABSOLUTE): 0.1 10*3/uL (ref 0.0–0.4)
Eos: 2 %
Hematocrit: 40.1 % (ref 37.5–51.0)
Hemoglobin: 13.4 g/dL (ref 13.0–17.7)
IMMATURE GRANS (ABS): 0 10*3/uL (ref 0.0–0.1)
IMMATURE GRANULOCYTES: 0 %
Lymphocytes Absolute: 1.8 10*3/uL (ref 0.7–3.1)
Lymphs: 31 %
MCH: 29.3 pg (ref 26.6–33.0)
MCHC: 33.4 g/dL (ref 31.5–35.7)
MCV: 88 fL (ref 79–97)
Monocytes Absolute: 0.4 10*3/uL (ref 0.1–0.9)
Monocytes: 7 %
NEUTROS PCT: 60 %
Neutrophils Absolute: 3.4 10*3/uL (ref 1.4–7.0)
Platelets: 198 10*3/uL (ref 150–450)
RBC: 4.57 x10E6/uL (ref 4.14–5.80)
RDW: 12.6 % (ref 12.3–15.4)
WBC: 5.7 10*3/uL (ref 3.4–10.8)

## 2018-06-07 LAB — PSA: PROSTATE SPECIFIC AG, SERUM: 1.6 ng/mL (ref 0.0–4.0)

## 2018-06-07 LAB — LIPID PANEL
CHOL/HDL RATIO: 2 ratio (ref 0.0–5.0)
Cholesterol, Total: 160 mg/dL (ref 100–199)
HDL: 79 mg/dL (ref >39)
LDL CALC: 62 mg/dL (ref 0–99)
TRIGLYCERIDES: 97 mg/dL (ref 0–149)
VLDL Cholesterol Cal: 19 mg/dL (ref 5–40)

## 2018-06-07 LAB — TSH: TSH: 2.45 u[IU]/mL (ref 0.450–4.500)

## 2018-06-07 LAB — VITAMIN B12: Vitamin B-12: 376 pg/mL (ref 232–1245)

## 2018-06-07 NOTE — Telephone Encounter (Signed)
-----   Message from Jerrol Banana., MD sent at 06/07/2018  9:25 AM EST ----- Stable.

## 2018-06-07 NOTE — Telephone Encounter (Signed)
Pt returned missed call. Please call pt back to discuss lab results.  Pt is ok with leaving the results on his voice mail if he doesn't answer.  Thanks, [employer]

## 2018-06-07 NOTE — Telephone Encounter (Signed)
Patient advised.KW 

## 2018-06-07 NOTE — Telephone Encounter (Signed)
Left message to call back  

## 2018-06-08 DIAGNOSIS — F4320 Adjustment disorder, unspecified: Secondary | ICD-10-CM | POA: Diagnosis not present

## 2018-06-17 ENCOUNTER — Other Ambulatory Visit: Payer: Self-pay | Admitting: Physician Assistant

## 2018-06-19 NOTE — Telephone Encounter (Signed)
Need to review paper chart  

## 2018-06-21 ENCOUNTER — Other Ambulatory Visit: Payer: Self-pay | Admitting: Family Medicine

## 2018-06-26 DIAGNOSIS — F4320 Adjustment disorder, unspecified: Secondary | ICD-10-CM | POA: Diagnosis not present

## 2018-07-01 ENCOUNTER — Other Ambulatory Visit: Payer: Self-pay | Admitting: Physician Assistant

## 2018-07-02 NOTE — Telephone Encounter (Signed)
PMP last fill 11/06/2017  Need to review paper chart, not seen in epic

## 2018-07-03 DIAGNOSIS — F4320 Adjustment disorder, unspecified: Secondary | ICD-10-CM | POA: Diagnosis not present

## 2018-07-04 DIAGNOSIS — Z79899 Other long term (current) drug therapy: Secondary | ICD-10-CM | POA: Diagnosis not present

## 2018-07-04 NOTE — Telephone Encounter (Signed)
Not seen in epic  Can you escribe?  Thanks

## 2018-07-04 NOTE — Telephone Encounter (Signed)
I'll take care of it. Thanks 

## 2018-07-17 ENCOUNTER — Other Ambulatory Visit: Payer: Self-pay | Admitting: Physician Assistant

## 2018-07-18 NOTE — Telephone Encounter (Signed)
Need to review paper chart  

## 2018-07-19 DIAGNOSIS — M170 Bilateral primary osteoarthritis of knee: Secondary | ICD-10-CM | POA: Diagnosis not present

## 2018-07-24 DIAGNOSIS — F4320 Adjustment disorder, unspecified: Secondary | ICD-10-CM | POA: Diagnosis not present

## 2018-08-03 DIAGNOSIS — F4320 Adjustment disorder, unspecified: Secondary | ICD-10-CM | POA: Diagnosis not present

## 2018-08-08 ENCOUNTER — Ambulatory Visit: Payer: BLUE CROSS/BLUE SHIELD | Admitting: Physician Assistant

## 2018-08-08 ENCOUNTER — Encounter: Payer: Self-pay | Admitting: Physician Assistant

## 2018-08-08 DIAGNOSIS — F411 Generalized anxiety disorder: Secondary | ICD-10-CM | POA: Diagnosis not present

## 2018-08-08 DIAGNOSIS — F319 Bipolar disorder, unspecified: Secondary | ICD-10-CM | POA: Diagnosis not present

## 2018-08-08 DIAGNOSIS — Z79899 Other long term (current) drug therapy: Secondary | ICD-10-CM

## 2018-08-08 NOTE — Progress Notes (Signed)
[facility] Med Check  Patient ID: Peter [name],  MRN: [id number]  PCP: Jerrol Banana., MD  Date of Evaluation: 08/08/2018 Time spent:15 minutes  Chief Complaint:  Chief Complaint    Follow-up      HISTORY/CURRENT STATUS: HPI For 6 month med check.  Patient denies loss of interest in usual activities and is able to enjoy things.  Denies decreased energy or motivation.  Appetite has not changed.  No extreme sadness, tearfulness, or feelings of hopelessness.  Denies any changes in concentration, making decisions or remembering things.  Denies suicidal or homicidal thoughts.  Patient denies increased energy with decreased need for sleep, no increased talkativeness, no racing thoughts, no impulsivity or risky behaviors, no increased spending, no increased libido, no grandiosity.  Anxiety is well controlled.  He usually needs the Klonopin only 2-3 times a day.  Individual Medical History/ Review of Systems: Changes? :No    Past medications for mental health diagnoses include: Zoloft, Cymbalta did not help, Pristiq did not help, Seroquel, Xanax, Depakote, Lamictal, Equetro, Klonopin  Allergies: Oxycodone and Isosorbide  Current Medications:  Current Outpatient Medications:  .  amLODipine (NORVASC) 10 MG tablet, TAKE 1 TABLET BY MOUTH EVERY DAY, Disp: 90 tablet, Rfl: 0 .  aspirin EC 81 MG tablet, Take 81 mg by mouth daily., Disp: , Rfl:  .  atorvastatin (LIPITOR) 40 MG tablet, , Disp: , Rfl: 3 .  atorvastatin (LIPITOR) 40 MG tablet, TAKE 1 TABLET(40 MG) BY MOUTH DAILY, Disp: 90 tablet, Rfl: 3 .  clobetasol ointment (TEMOVATE) 0.05 %, , Disp: , Rfl: 1 .  clonazePAM (KLONOPIN) 0.5 MG tablet, TAKE 1 TABLET BY MOUTH EVERY 6 HOURS AS NEEDED FOR ANXIETY, Disp: 120 tablet, Rfl: 0 .  EQUETRO 200 MG CP12 12 hr capsule, TAKE 3 CAPSULES BY MOUTH EVERY MORNING AND 2 CAPSULES EVERY NIGHT AT BEDTIME, Disp: 150 capsule, Rfl: 0 .  Garlic 10 MG CAPS, Take by mouth daily., Disp: , Rfl:   .  hydrALAZINE (APRESOLINE) 100 MG tablet, Take 1 tablet (100 mg total) by mouth 3 (three) times daily., Disp: 270 tablet, Rfl: 3 .  ibuprofen (ADVIL,MOTRIN) 200 MG tablet, Take 800 mg by mouth 3 (three) times daily as needed for headache or moderate pain., Disp: , Rfl:  .  lamoTRIgine (LAMICTAL) 200 MG tablet, TAKE 1 TABLET BY MOUTH AT BEDTIME, Disp: 30 tablet, Rfl: 0 .  losartan (COZAAR) 100 MG tablet, Take 1 tablet (100 mg total) by mouth daily., Disp: 90 tablet, Rfl: 3 .  LUMIGAN 0.01 % SOLN, INT 1 GTT IN OU QD IN THE EVE, Disp: , Rfl: 7 .  metoprolol tartrate (LOPRESSOR) 50 MG tablet, TAKE 1 TABLET(50 MG) BY MOUTH TWICE DAILY, Disp: 180 tablet, Rfl: 3 .  NON FORMULARY, CPAP, Disp: , Rfl:  .  Omega-3 1000 MG CAPS, Take by mouth daily., Disp: , Rfl:  .  pantoprazole (PROTONIX) 40 MG tablet, TAKE 1 TABLET(40 MG) BY MOUTH EVERY MORNING, Disp: 30 tablet, Rfl: 11 .  RUTIN PO, Take by mouth daily., Disp: , Rfl:  .  sertraline (ZOLOFT) 100 MG tablet, TAKE 1 TABLET BY MOUTH TWICE DAILY (Patient taking differently: Take 100 mg by mouth daily. ), Disp: 180 tablet, Rfl: 3 .  spironolactone (ALDACTONE) 50 MG tablet, TAKE 1 TABLET(50 MG) BY MOUTH DAILY, Disp: 30 tablet, Rfl: 11 .  vitamin C (ASCORBIC ACID) 500 MG tablet, Take 500 mg by mouth daily., Disp: , Rfl:  .  ranitidine (ZANTAC) 300 MG tablet,  TAKE 1 TABLET(300 MG) BY MOUTH TWICE DAILY (Patient not taking: Reported on 08/08/2018), Disp: 60 tablet, Rfl: 11 .  tolterodine (DETROL LA) 4 MG 24 hr capsule, Take 1 capsule (4 mg total) by mouth daily. (Patient not taking: Reported on 08/08/2018), Disp: 30 capsule, Rfl: 12 Medication Side Effects: none  Family Medical/ Social History: Changes? No  MENTAL HEALTH EXAM:  There were no vitals taken for this visit.There is no height or weight on file to calculate BMI.  General Appearance: Casual, Well Groomed and Obese  Eye Contact:  Good  Speech:  Clear and Coherent  Volume:  Normal  Mood:  Euthymic   Affect:  Appropriate  Thought Process:  Goal Directed  Orientation:  Full (Time, Place, and Person)  Thought Content: Logical   Suicidal Thoughts:  No  Homicidal Thoughts:  No  Memory:  WNL  Judgement:  Good  Insight:  Good  Psychomotor Activity:  Normal walks w/cane  Concentration:  Concentration: Good  Recall:  Good  Fund of Knowledge: Good  Language: Good  Assets:  Desire for Improvement  ADL's:  Intact  Cognition: WNL  Prognosis:  Good  07/04/18 CBZ level 8.4  Labs from 06/06/18 on chart and reviewed.  DIAGNOSES:    ICD-10-CM   1. Bipolar I disorder ([place]) F31.9   2. Generalized anxiety disorder F41.1   3. Encounter for long-term (current) use of medications Z79.899 Carbamazepine level, total    Receiving Psychotherapy: No    RECOMMENDATIONS: Continue Equetro 200 mg 3 p.o. every morning and 2 p.o. nightly. Continue Klonopin 0.5 mg 3 times daily as needed. Continue Lamictal 200 mg p.o. nightly. Continue Zoloft 100 mg daily. Obtain carbamazepine level in 6 months prior to next visit. Return in 6 months.  Donnal Moat, PA-C

## 2018-08-09 DIAGNOSIS — D2261 Melanocytic nevi of right upper limb, including shoulder: Secondary | ICD-10-CM | POA: Diagnosis not present

## 2018-08-09 DIAGNOSIS — D2272 Melanocytic nevi of left lower limb, including hip: Secondary | ICD-10-CM | POA: Diagnosis not present

## 2018-08-09 DIAGNOSIS — D2262 Melanocytic nevi of left upper limb, including shoulder: Secondary | ICD-10-CM | POA: Diagnosis not present

## 2018-08-09 DIAGNOSIS — D225 Melanocytic nevi of trunk: Secondary | ICD-10-CM | POA: Diagnosis not present

## 2018-08-10 ENCOUNTER — Other Ambulatory Visit: Payer: Self-pay | Admitting: Family Medicine

## 2018-08-10 DIAGNOSIS — I10 Essential (primary) hypertension: Secondary | ICD-10-CM

## 2018-08-14 ENCOUNTER — Ambulatory Visit: Payer: Self-pay | Admitting: Family Medicine

## 2018-08-17 ENCOUNTER — Other Ambulatory Visit: Payer: Self-pay | Admitting: Physician Assistant

## 2018-08-17 ENCOUNTER — Other Ambulatory Visit: Payer: Self-pay | Admitting: Family Medicine

## 2018-08-17 DIAGNOSIS — F4320 Adjustment disorder, unspecified: Secondary | ICD-10-CM | POA: Diagnosis not present

## 2018-08-17 DIAGNOSIS — I10 Essential (primary) hypertension: Secondary | ICD-10-CM

## 2018-08-31 DIAGNOSIS — F4320 Adjustment disorder, unspecified: Secondary | ICD-10-CM | POA: Diagnosis not present

## 2018-09-14 DIAGNOSIS — F4320 Adjustment disorder, unspecified: Secondary | ICD-10-CM | POA: Diagnosis not present

## 2018-09-17 ENCOUNTER — Other Ambulatory Visit: Payer: Self-pay | Admitting: Physician Assistant

## 2018-09-25 ENCOUNTER — Other Ambulatory Visit: Payer: Self-pay

## 2018-09-25 ENCOUNTER — Encounter: Payer: Self-pay | Admitting: Family Medicine

## 2018-09-25 ENCOUNTER — Ambulatory Visit: Payer: BLUE CROSS/BLUE SHIELD | Admitting: Family Medicine

## 2018-09-25 VITALS — BP 130/82 | HR 77 | Temp 98.2°F | Resp 18 | Wt 311.4 lb

## 2018-09-25 DIAGNOSIS — K219 Gastro-esophageal reflux disease without esophagitis: Secondary | ICD-10-CM | POA: Diagnosis not present

## 2018-09-25 DIAGNOSIS — N3281 Overactive bladder: Secondary | ICD-10-CM | POA: Diagnosis not present

## 2018-09-25 DIAGNOSIS — Z6841 Body Mass Index (BMI) 40.0 and over, adult: Secondary | ICD-10-CM

## 2018-09-25 DIAGNOSIS — G4733 Obstructive sleep apnea (adult) (pediatric): Secondary | ICD-10-CM | POA: Diagnosis not present

## 2018-09-25 DIAGNOSIS — I10 Essential (primary) hypertension: Secondary | ICD-10-CM | POA: Diagnosis not present

## 2018-09-25 DIAGNOSIS — E269 Hyperaldosteronism, unspecified: Secondary | ICD-10-CM

## 2018-09-25 MED ORDER — PANTOPRAZOLE SODIUM 40 MG PO TBEC: 40.0000 mg | DELAYED_RELEASE_TABLET | Freq: Two times a day (BID) | ORAL | 11 refills | Status: DC

## 2018-09-25 MED ORDER — SOLIFENACIN SUCCINATE 5 MG PO TABS: 5.0000 mg | ORAL_TABLET | Freq: Every day | ORAL | 5 refills | Status: DC

## 2018-09-25 NOTE — Progress Notes (Signed)
Patient: Peter [name] Male    DOB: Jul 01, 1961   58 y.o.   MRN: [id number] Visit Date: 09/25/2018  Today's Provider: [name], MD   Chief Complaint  Patient presents with  . Follow-up   Subjective:     HPI 3 Month Follow Up for Tolterodine Tart ER. Patient states that medication is only working a little. Patient also states that he discontinued Ranitidine and would like to try a different medication.GERD still an issue.  He is slowly getting stronger. He is now having to work second shift and finds this tiring but it is ok. His walking is much steadier. He is back to work full time. Allergies  Allergen Reactions  . Oxycodone Other (See Comments)    Causes Hallucinations, auditory and tactile  . Isosorbide     Headache, nausea, vomiting     Current Outpatient Medications:  .  amLODipine (NORVASC) 10 MG tablet, TAKE 1 TABLET BY MOUTH EVERY DAY, Disp: 90 tablet, Rfl: 0 .  aspirin EC 81 MG tablet, Take 81 mg by mouth daily., Disp: , Rfl:  .  atorvastatin (LIPITOR) 40 MG tablet, , Disp: , Rfl: 3 .  atorvastatin (LIPITOR) 40 MG tablet, TAKE 1 TABLET(40 MG) BY MOUTH DAILY, Disp: 90 tablet, Rfl: 3 .  clobetasol ointment (TEMOVATE) 0.05 %, , Disp: , Rfl: 1 .  clonazePAM (KLONOPIN) 0.5 MG tablet, TAKE 1 TABLET BY MOUTH EVERY 6 HOURS AS NEEDED FOR ANXIETY, Disp: 120 tablet, Rfl: 0 .  EQUETRO 200 MG CP12 12 hr capsule, TAKE 3 CAPSULES BY MOUTH EVERY MORNING AND 2 EVERY NIGHT AT BEDTIME, Disp: 150 capsule, Rfl: 5 .  Garlic 10 MG CAPS, Take by mouth daily., Disp: , Rfl:  .  hydrALAZINE (APRESOLINE) 100 MG tablet, TAKE 1 TABLET(100 MG) BY MOUTH THREE TIMES DAILY, Disp: 270 tablet, Rfl: 3 .  ibuprofen (ADVIL,MOTRIN) 200 MG tablet, Take 800 mg by mouth 3 (three) times daily as needed for headache or moderate pain., Disp: , Rfl:  .  lamoTRIgine (LAMICTAL) 200 MG tablet, TAKE 1 TABLET BY MOUTH AT BEDTIME, Disp: 30 tablet, Rfl: 5 .  losartan (COZAAR) 100 MG tablet, TAKE 1  TABLET(100 MG) BY MOUTH DAILY, Disp: 90 tablet, Rfl: 3 .  LUMIGAN 0.01 % SOLN, INT 1 GTT IN OU QD IN THE EVE, Disp: , Rfl: 7 .  metoprolol tartrate (LOPRESSOR) 50 MG tablet, TAKE 1 TABLET(50 MG) BY MOUTH TWICE DAILY, Disp: 180 tablet, Rfl: 3 .  NON FORMULARY, CPAP, Disp: , Rfl:  .  Omega-3 1000 MG CAPS, Take by mouth daily., Disp: , Rfl:  .  pantoprazole (PROTONIX) 40 MG tablet, TAKE 1 TABLET(40 MG) BY MOUTH EVERY MORNING, Disp: 30 tablet, Rfl: 11 .  RUTIN PO, Take by mouth daily., Disp: , Rfl:  .  sertraline (ZOLOFT) 100 MG tablet, TAKE 1 TABLET BY MOUTH TWICE DAILY (Patient taking differently: Take 100 mg by mouth daily. ), Disp: 180 tablet, Rfl: 3 .  spironolactone (ALDACTONE) 50 MG tablet, TAKE 1 TABLET(50 MG) BY MOUTH DAILY, Disp: 30 tablet, Rfl: 11 .  tolterodine (DETROL LA) 4 MG 24 hr capsule, Take 1 capsule (4 mg total) by mouth daily., Disp: 30 capsule, Rfl: 12 .  vitamin C (ASCORBIC ACID) 500 MG tablet, Take 500 mg by mouth daily., Disp: , Rfl:  .  ranitidine (ZANTAC) 300 MG tablet, TAKE 1 TABLET(300 MG) BY MOUTH TWICE DAILY (Patient not taking: Reported on 08/08/2018), Disp: 60 tablet, Rfl: 11  Review of Systems  Constitutional: Negative.   HENT: Negative.   Eyes: Negative.   Respiratory: Negative.   Cardiovascular: Negative.   Gastrointestinal: Negative.   Endocrine: Negative.   Genitourinary: Positive for frequency and urgency.  Musculoskeletal: Positive for arthralgias.  Allergic/Immunologic: Negative.   Neurological: Negative.   Psychiatric/Behavioral: Negative.   All other systems reviewed and are negative.   Social History   Tobacco Use  . Smoking status: Never Smoker  . Smokeless tobacco: Never Used  Substance Use Topics  . Alcohol use: Yes    Comment: occasionally      Objective:   BP 130/82 (BP Location: Right Arm, Patient Position: Sitting, Cuff Size: Large)   Pulse 77   Temp 98.2 F (36.8 C) (Oral)   Resp 18   Wt (!) 311 lb 6.4 oz (141.3 kg)   SpO2  95%   BMI 48.77 kg/m  Vitals:   09/25/18 1556  BP: 130/82  Pulse: 77  Resp: 18  Temp: 98.2 F (36.8 C)  TempSrc: Oral  SpO2: 95%  Weight: (!) 311 lb 6.4 oz (141.3 kg)     Physical Exam Vitals signs reviewed.  Constitutional:      Appearance: He is obese.  HENT:     Head: Normocephalic and atraumatic.     Right Ear: External ear normal.     Left Ear: External ear normal.  Eyes:     General: No scleral icterus. Cardiovascular:     Rate and Rhythm: Normal rate and regular rhythm.     Heart sounds: Normal heart sounds.  Pulmonary:     Effort: Pulmonary effort is normal.     Breath sounds: Normal breath sounds.  Musculoskeletal:     Comments: Trace bilat edema. He walks with cane but is steadier.  Lymphadenopathy:     Cervical: No cervical adenopathy.  Skin:    General: Skin is warm and dry.  Neurological:     Mental Status: He is alert and oriented to person, place, and time. Mental status is at baseline.  Psychiatric:        Mood and Affect: Mood normal.        Behavior: Behavior normal.        Thought Content: Thought content normal.        Judgment: Judgment normal.         Assessment & Plan    1. Overactive bladder Switch to Vesicare. - solifenacin (VESICARE) 5 MG tablet; Take 1 tablet (5 mg total) by mouth daily.  Dispense: 30 tablet; Refill: 5  2. Gastroesophageal reflux disease, esophagitis presence not specified Stop Zantac and try BID Protonix. - pantoprazole (PROTONIX) 40 MG tablet; Take 1 tablet (40 mg total) by mouth 2 (two) times daily.  Dispense: 60 tablet; Refill: 11  3. Essential (primary) hypertension Controlled with losartan ,aldactone.  4. Obstructive sleep apnea syndrome   5. Hyperaldosteronism (HCC)   6. Class 3 severe obesity due to excess calories with serious comorbidity and body mass index (BMI) of 45.0 to 49.9 in adult [facility]) Pt to continue to work on habits.    I have done the exam and reviewed the above chart and it is  accurate to the best of my knowledge. Development worker, community has been used in this note in any air is in the dictation or transcription are unintentional.  [name], MD  [place]

## 2018-10-12 DIAGNOSIS — F4320 Adjustment disorder, unspecified: Secondary | ICD-10-CM | POA: Diagnosis not present

## 2018-10-26 DIAGNOSIS — F4320 Adjustment disorder, unspecified: Secondary | ICD-10-CM | POA: Diagnosis not present

## 2018-11-09 DIAGNOSIS — F4320 Adjustment disorder, unspecified: Secondary | ICD-10-CM | POA: Diagnosis not present

## 2018-11-16 ENCOUNTER — Other Ambulatory Visit: Payer: Self-pay | Admitting: Family Medicine

## 2018-11-30 DIAGNOSIS — F4320 Adjustment disorder, unspecified: Secondary | ICD-10-CM | POA: Diagnosis not present

## 2018-12-13 ENCOUNTER — Other Ambulatory Visit: Payer: Self-pay

## 2018-12-13 ENCOUNTER — Encounter: Payer: Self-pay | Admitting: Family Medicine

## 2018-12-13 ENCOUNTER — Encounter: Payer: Self-pay | Admitting: Physician Assistant

## 2018-12-13 ENCOUNTER — Ambulatory Visit: Payer: BLUE CROSS/BLUE SHIELD | Admitting: Physician Assistant

## 2018-12-13 ENCOUNTER — Ambulatory Visit (INDEPENDENT_AMBULATORY_CARE_PROVIDER_SITE_OTHER): Payer: BC Managed Care – PPO | Admitting: Family Medicine

## 2018-12-13 VITALS — BP 134/84 | HR 62 | Temp 98.4°F | Resp 16 | Wt 299.0 lb

## 2018-12-13 DIAGNOSIS — M7989 Other specified soft tissue disorders: Secondary | ICD-10-CM

## 2018-12-13 DIAGNOSIS — F411 Generalized anxiety disorder: Secondary | ICD-10-CM

## 2018-12-13 DIAGNOSIS — Z79899 Other long term (current) drug therapy: Secondary | ICD-10-CM | POA: Diagnosis not present

## 2018-12-13 DIAGNOSIS — E291 Testicular hypofunction: Secondary | ICD-10-CM | POA: Diagnosis not present

## 2018-12-13 DIAGNOSIS — F4323 Adjustment disorder with mixed anxiety and depressed mood: Secondary | ICD-10-CM

## 2018-12-13 DIAGNOSIS — I10 Essential (primary) hypertension: Secondary | ICD-10-CM | POA: Diagnosis not present

## 2018-12-13 DIAGNOSIS — K219 Gastro-esophageal reflux disease without esophagitis: Secondary | ICD-10-CM | POA: Diagnosis not present

## 2018-12-13 DIAGNOSIS — F3341 Major depressive disorder, recurrent, in partial remission: Secondary | ICD-10-CM

## 2018-12-13 DIAGNOSIS — G4733 Obstructive sleep apnea (adult) (pediatric): Secondary | ICD-10-CM | POA: Diagnosis not present

## 2018-12-13 DIAGNOSIS — F319 Bipolar disorder, unspecified: Secondary | ICD-10-CM

## 2018-12-13 DIAGNOSIS — T8131XS Disruption of external operation (surgical) wound, not elsewhere classified, sequela: Secondary | ICD-10-CM

## 2018-12-13 MED ORDER — LAMOTRIGINE 200 MG PO TABS: 200.0000 mg | ORAL_TABLET | Freq: Every day | ORAL | 1 refills | Status: DC

## 2018-12-13 MED ORDER — CARBAMAZEPINE 200 MG PO TABS: ORAL_TABLET | ORAL | 1 refills | Status: DC

## 2018-12-13 MED ORDER — CLONAZEPAM 0.5 MG PO TABS: 0.5000 mg | ORAL_TABLET | Freq: Four times a day (QID) | ORAL | 5 refills | Status: DC | PRN

## 2018-12-13 MED ORDER — FAMOTIDINE 40 MG PO TABS: 40.0000 mg | ORAL_TABLET | Freq: Every day | ORAL | 5 refills | Status: DC

## 2018-12-13 NOTE — Progress Notes (Signed)
Patient: Peter [name] Male    DOB: [birth year]   58 y.o.   MRN: [id number] Visit Date: 12/13/2018  Today's Provider: [name], MD   Chief Complaint  Patient presents with  . Overactive Bladder  . Gastroesophageal Reflux  . Hypertension   Subjective:    Pt just lost his job after 43 years with the same company. He is sad about this but not suicidal or homicidal.   Overactive Bladder  Patient returns to office today for follow up visit from 09/25/18. At last visit patient was switched to [facility] for improved bladder control. Patient reports that though his condition is not worse he has not noticed any improvement with symptom control.   Hypertension, follow-up:  BP Readings from Last 3 Encounters:  12/13/18 134/84  09/25/18 130/82  06/06/18 (!) 155/89    He was last seen for hypertension 3 months ago.  BP at that visit was 130/82. Management changes since that visit include . He reports excellent compliance with treatment. He is not having side effects. He is not exercising. He is adherent to low salt diet.   Outside blood pressures are systolic high in 357S and diastolic in the 17B. He is experiencing none.  Patient denies chest pain, chest pressure/discomfort, dyspnea, exertional chest pressure/discomfort, fatigue, irregular heart beat, lower extremity edema, near-syncope, orthopnea, palpitations, paroxysmal nocturnal dyspnea, syncope and tachypnea.   Cardiovascular risk factors include advanced age (older than 24 for men, 18 for women), hypertension, male gender and obesity (BMI >= 30 kg/m2).  Use of agents associated with hypertension: none.     Weight trend: fluctuating a bit Wt Readings from Last 3 Encounters:  12/13/18 299 lb (135.6 kg)  09/25/18 (!) 311 lb 6.4 oz (141.3 kg)  06/06/18 (!) 304 lb 9.6 oz (138.2 kg)    Current diet: not asked  ------------------------------------------------------------------------  GERD, Follow up:  The  patient was last seen for GERD 3 months ago. Changes made since that visit include stopped Zantac and increased Protonix tow twice a day.  He reports good compliance with treatment. He is not having side effects.Marland Kitchen  He IS experiencing choking on food, difficulty swallowing, heartburn, nausea and shortness of breath. He is NOT experiencing abdominal bloating, belching, belching and eructation, deep pressure at base of neck, fullness after meals, hoarseness or regurgitation of undigested food  ------------------------------------------------------------------------    Allergies  Allergen Reactions  . Oxycodone Other (See Comments)    Causes Hallucinations, auditory and tactile  . Isosorbide     Headache, nausea, vomiting     Current Outpatient Medications:  .  amLODipine (NORVASC) 10 MG tablet, TAKE 1 TABLET BY MOUTH EVERY DAY, Disp: 90 tablet, Rfl: 3 .  aspirin EC 81 MG tablet, Take 81 mg by mouth daily., Disp: , Rfl:  .  atorvastatin (LIPITOR) 40 MG tablet, , Disp: , Rfl: 3 .  carbamazepine (TEGRETOL) 200 MG tablet, 3 po q am, 2 qhs., Disp: 450 tablet, Rfl: 1 .  clobetasol ointment (TEMOVATE) 0.05 %, , Disp: , Rfl: 1 .  clonazePAM (KLONOPIN) 0.5 MG tablet, Take 1 tablet (0.5 mg total) by mouth every 6 (six) hours as needed. for anxiety, Disp: 120 tablet, Rfl: 5 .  Garlic 10 MG CAPS, Take by mouth daily., Disp: , Rfl:  .  hydrALAZINE (APRESOLINE) 100 MG tablet, TAKE 1 TABLET(100 MG) BY MOUTH THREE TIMES DAILY, Disp: 270 tablet, Rfl: 3 .  lamoTRIgine (LAMICTAL) 200 MG tablet, Take 1 tablet (200  mg total) by mouth at bedtime., Disp: 90 tablet, Rfl: 1 .  losartan (COZAAR) 100 MG tablet, TAKE 1 TABLET(100 MG) BY MOUTH DAILY, Disp: 90 tablet, Rfl: 3 .  LUMIGAN 0.01 % SOLN, INT 1 GTT IN OU QD IN THE EVE, Disp: , Rfl: 7 .  metoprolol tartrate (LOPRESSOR) 50 MG tablet, TAKE 1 TABLET(50 MG) BY MOUTH TWICE DAILY, Disp: 180 tablet, Rfl: 3 .  NON FORMULARY, CPAP, Disp: , Rfl:  .  pantoprazole  (PROTONIX) 40 MG tablet, Take 1 tablet (40 mg total) by mouth 2 (two) times daily., Disp: 60 tablet, Rfl: 11 .  sertraline (ZOLOFT) 100 MG tablet, Take 100 mg by mouth daily., Disp: , Rfl:  .  solifenacin (VESICARE) 5 MG tablet, Take 1 tablet (5 mg total) by mouth daily., Disp: 30 tablet, Rfl: 5 .  spironolactone (ALDACTONE) 50 MG tablet, TAKE 1 TABLET(50 MG) BY MOUTH DAILY, Disp: 30 tablet, Rfl: 11 .  ibuprofen (ADVIL,MOTRIN) 200 MG tablet, Take 800 mg by mouth 3 (three) times daily as needed for headache or moderate pain., Disp: , Rfl:  .  Omega-3 1000 MG CAPS, Take by mouth daily., Disp: , Rfl:  .  ranitidine (ZANTAC) 300 MG tablet, TAKE 1 TABLET(300 MG) BY MOUTH TWICE DAILY (Patient not taking: Reported on 08/08/2018), Disp: 60 tablet, Rfl: 11 .  RUTIN PO, Take by mouth daily., Disp: , Rfl:  .  tolterodine (DETROL LA) 4 MG 24 hr capsule, Take 1 capsule (4 mg total) by mouth daily. (Patient not taking: Reported on 12/13/2018), Disp: 30 capsule, Rfl: 12 .  vitamin C (ASCORBIC ACID) 500 MG tablet, Take 500 mg by mouth daily., Disp: , Rfl:   Review of Systems  Constitutional: Negative.   HENT: Negative.  Negative for hoarse voice and sore throat.   Eyes: Negative.   Respiratory: Positive for choking. Negative for cough and wheezing.   Cardiovascular: Negative for chest pain.  Gastrointestinal: Positive for dysphagia. Negative for abdominal pain, heartburn and nausea.  Endocrine: Negative.   Allergic/Immunologic: Negative.   Neurological: Positive for headaches.       Recent headache on top of head--relieved with ibuprofen.  Psychiatric/Behavioral: Negative.     Social History   Tobacco Use  . Smoking status: Never Smoker  . Smokeless tobacco: Never Used  Substance Use Topics  . Alcohol use: Yes    Comment: occasionally      Objective:   BP 134/84   Pulse 62   Temp 98.4 F (36.9 C) (Oral)   Resp 16   Wt 299 lb (135.6 kg)   BMI 46.83 kg/m  Vitals:   12/13/18 1544  BP: 134/84   Pulse: 62  Resp: 16  Temp: 98.4 F (36.9 C)  TempSrc: Oral  Weight: 299 lb (135.6 kg)     Physical Exam Vitals signs reviewed.  Constitutional:      Appearance: He is well-developed.     Comments: Obese white male in no acute distress  HENT:     Head: Normocephalic and atraumatic.     Right Ear: External ear normal.     Left Ear: External ear normal.     Nose: Nose normal.  Eyes:     General: No scleral icterus.    Conjunctiva/sclera: Conjunctivae normal.  Neck:     Thyroid: No thyromegaly.     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     Heart sounds:  Normal heart sounds.  Pulmonary:     Effort: Pulmonary effort is normal.     Breath sounds: Normal breath sounds.  Abdominal:     General: Bowel sounds are normal. There is no distension.     Tenderness: There is no abdominal tenderness.     Comments: Midline scar is surrounded by can very firm scar tissue probably 3 inches on either side of the scar.  Skin:    General: Skin is warm and dry.  Neurological:     Mental Status: He is alert and oriented to person, place, and time.     Cranial Nerves: No cranial nerve deficit.     Sensory: No sensory deficit.     Motor: No abnormal muscle tone.  Psychiatric:        Mood and Affect: Mood normal.        Behavior: Behavior normal.        Thought Content: Thought content normal.        Judgment: Judgment normal.         Assessment & Plan    1. Gastroesophageal reflux disease, esophagitis presence not specified Stop Zantac and add Pepcid . - famotidine (PEPCID) 40 MG tablet; Take 1 tablet (40 mg total) by mouth daily.  Dispense: 30 tablet; Refill: 5  2. Essential (primary) hypertension   3. Obstructive sleep apnea syndrome   4. Testicular hypofunction   5. Recurrent major depressive disorder, in partial remission ([place]) Pt ok today. Will f/u 2-3 months due to job loss.  6.  Necrotizing soft tissue infection Recovered.  7. Open abdominal incision with drainage, sequela Resolved. 8.Headache Follow for now.   I have done the exam and reviewed the above chart and it is accurate to the best of my knowledge. Development worker, community has been used in this note in any air is in the dictation or transcription are unintentional.  [name], MD  [place]

## 2018-12-13 NOTE — Progress Notes (Signed)
[facility] Check  Patient ID: Peter [name],  MRN: [id number]  PCP: Jerrol Banana., MD  Date of Evaluation: 12/13/2018 Time spent:25 minutes  Chief Complaint:  Chief Complaint    Medication Refill      HISTORY/CURRENT STATUS: HPI For routine med check.  He is not doing very well.  Has had more irritability and anxiety.  Also more sad.  His physical health also affects his energy and motivation, which is low he does not want to do much.  He is isolating some but part of that reason is due to the coronavirus pandemic shelter in place..  Denies suicidal or homicidal thoughts.  He is sleeping fairly well except having to get up several times at night to go to the restroom.  He is using his CPAP machine which helps.  Patient denies increased energy with decreased need for sleep, no increased talkativeness, no racing thoughts, no impulsivity or risky behaviors, no increased spending, no increased libido, no grandiosity.  Anxiety is worse for the past several weeks.  He ran out of the clonazepam about a week ago.  He has had no seizure-like activity.  He just feels more anxious and easy to get irritated.  He is also dealing with job loss as noted in social history.  Even prior to that, he was having some hopelessness, concerning his mental health, feeling that he might never improve, but just be controlled like he was at that time.  He continues to see Dr. Rosanna Randy, PCP for hypertension, hyperlipidemia, GERD, BPH, obesity.  Fair control with those illnesses.  Denies dizziness, syncope, seizures, numbness, tingling, tremor, tics, unsteady gait, slurred speech, confusion.  Has chronic joint pain, and stiffness, no dystonia.   Individual Medical History/ Review of Systems: Changes? :No    Past medications for mental health diagnoses include: Zoloft, Cymbalta did not help, Pristiq did not help, Seroquel, Xanax, Depakote, Lamictal, Equetro, Klonopin  Allergies: Oxycodone and  Isosorbide  Current Medications:  Current Outpatient Medications:  .  amLODipine (NORVASC) 10 MG tablet, TAKE 1 TABLET BY MOUTH EVERY DAY, Disp: 90 tablet, Rfl: 3 .  aspirin EC 81 MG tablet, Take 81 mg by mouth daily., Disp: , Rfl:  .  atorvastatin (LIPITOR) 40 MG tablet, , Disp: , Rfl: 3 .  clobetasol ointment (TEMOVATE) 0.05 %, , Disp: , Rfl: 1 .  clonazePAM (KLONOPIN) 0.5 MG tablet, Take 1 tablet (0.5 mg total) by mouth every 6 (six) hours as needed. for anxiety, Disp: 120 tablet, Rfl: 5 .  Garlic 10 MG CAPS, Take by mouth daily., Disp: , Rfl:  .  hydrALAZINE (APRESOLINE) 100 MG tablet, TAKE 1 TABLET(100 MG) BY MOUTH THREE TIMES DAILY, Disp: 270 tablet, Rfl: 3 .  ibuprofen (ADVIL,MOTRIN) 200 MG tablet, Take 800 mg by mouth 3 (three) times daily as needed for headache or moderate pain., Disp: , Rfl:  .  lamoTRIgine (LAMICTAL) 200 MG tablet, Take 1 tablet (200 mg total) by mouth at bedtime., Disp: 90 tablet, Rfl: 1 .  losartan (COZAAR) 100 MG tablet, TAKE 1 TABLET(100 MG) BY MOUTH DAILY, Disp: 90 tablet, Rfl: 3 .  LUMIGAN 0.01 % SOLN, INT 1 GTT IN OU QD IN THE EVE, Disp: , Rfl: 7 .  metoprolol tartrate (LOPRESSOR) 50 MG tablet, TAKE 1 TABLET(50 MG) BY MOUTH TWICE DAILY, Disp: 180 tablet, Rfl: 3 .  NON FORMULARY, CPAP, Disp: , Rfl:  .  Omega-3 1000 MG CAPS, Take by mouth daily., Disp: , Rfl:  .  pantoprazole (PROTONIX) 40 MG tablet, Take 1 tablet (40 mg total) by mouth 2 (two) times daily., Disp: 60 tablet, Rfl: 11 .  RUTIN PO, Take by mouth daily., Disp: , Rfl:  .  sertraline (ZOLOFT) 100 MG tablet, Take 100 mg by mouth daily., Disp: , Rfl:  .  solifenacin (VESICARE) 5 MG tablet, Take 1 tablet (5 mg total) by mouth daily., Disp: 30 tablet, Rfl: 5 .  spironolactone (ALDACTONE) 50 MG tablet, TAKE 1 TABLET(50 MG) BY MOUTH DAILY, Disp: 30 tablet, Rfl: 11 .  vitamin C (ASCORBIC ACID) 500 MG tablet, Take 500 mg by mouth daily., Disp: , Rfl:  .  carbamazepine (TEGRETOL) 200 MG tablet, 3 po q am, 2  qhs., Disp: 450 tablet, Rfl: 1 .  ranitidine (ZANTAC) 300 MG tablet, TAKE 1 TABLET(300 MG) BY MOUTH TWICE DAILY (Patient not taking: Reported on 08/08/2018), Disp: 60 tablet, Rfl: 11 .  tolterodine (DETROL LA) 4 MG 24 hr capsule, Take 1 capsule (4 mg total) by mouth daily. (Patient not taking: Reported on 12/13/2018), Disp: 30 capsule, Rfl: 12 Medication Side Effects: none  Family Medical/ Social History: Changes? Yes He lost his job recently, after working there for 40 years.  Employer stated it was due to the coronavirus pandemic. Getting unemployment, but also looking for another job.  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  Plus difficult w/ his mental and physical illnesses.    MENTAL HEALTH EXAM:  There were no vitals taken for this visit.There is no height or weight on file to calculate BMI.  General Appearance: Casual and Obese  Eye Contact:  Good  Speech:  Clear and Coherent  Volume:  Normal  Mood:  Depressed  Affect:  Appropriate  Thought Process:  Goal Directed  Orientation:  Full (Time, Place, and Person)  Thought Content: Logical   Suicidal Thoughts:  No  Homicidal Thoughts:  No  Memory:  WNL  Judgement:  Good  Insight:  Good  Psychomotor Activity:  Normal  Concentration:  Concentration: Good  Recall:  Good  Fund of Knowledge: Good  Language: Good  Assets:  Desire for Improvement  ADL's:  Intact  Cognition: WNL  Prognosis:  Good    DIAGNOSES:    ICD-10-CM   1. Bipolar I disorder ([place]) F31.9   2. Encounter for long-term (current) use of medications Z79.899 Carbamazepine level, total    Lamotrigine level    CBC with Differential/Platelet    Comprehensive metabolic panel  3. Generalized anxiety disorder F41.1   4. Adjustment disorder with mixed anxiety and depressed mood F43.23     Receiving Psychotherapy: No    RECOMMENDATIONS:  I spent 25 minutes with him and at least 50% of that time was in counseling.  He will be losing his health insurance sometime in  the next 3 to 6 months.  The [employer] is very expensive when paying out-of-pocket, and even now it is hard for him with the high co-pay.  We decided to change that to plain carbamazepine due to cost.  It should work well. May need to increase Lamictal but I prefer to get a level before changing the dose.  He verbalizes understanding. We briefly discussed the possibility of disability.  I have known him for four  years and believe that his mental health to be fragile at times, and combined with all of the physical health problems, it can be difficult for him to work.  If he was not such an independent, hard worker, it's  doubtful that he would have been able to work as long as he has.  In my professional opinion, he would qualify for disability.  Patient states he will think about whether he should apply or not. DC Equetro. Start carbamazepine 200 mg, 3 p.o. every morning and 2 p.o. nightly. Continue Klonopin 0.5 mg every 6 hours as needed. Continue Lamictal 200 mg nightly. Continue Zoloft 100 mg daily, PCP prescribes this. Draw CBC with differential, CMP, carbamazepine level, lamotrigine level.  Have this drawn before taking morning pills, in approximately 10 to 14 days after he starts the generic carbamazepine.  He verbalizes understanding. Return in 3 months.  Donnal Moat, PA-C   This record has been created using [employer].  Chart creation errors have been sought, but may not always have been located and corrected. Such creation errors do not reflect on the standard of medical care.

## 2018-12-14 ENCOUNTER — Other Ambulatory Visit: Payer: Self-pay | Admitting: Family Medicine

## 2018-12-16 DIAGNOSIS — K219 Gastro-esophageal reflux disease without esophagitis: Secondary | ICD-10-CM | POA: Insufficient documentation

## 2018-12-20 DIAGNOSIS — F4320 Adjustment disorder, unspecified: Secondary | ICD-10-CM | POA: Diagnosis not present

## 2019-01-01 ENCOUNTER — Other Ambulatory Visit: Payer: Self-pay

## 2019-01-01 ENCOUNTER — Ambulatory Visit (INDEPENDENT_AMBULATORY_CARE_PROVIDER_SITE_OTHER): Payer: BC Managed Care – PPO | Admitting: Family Medicine

## 2019-01-01 ENCOUNTER — Encounter: Payer: Self-pay | Admitting: Family Medicine

## 2019-01-01 VITALS — BP 130/82 | HR 65 | Temp 98.8°F | Resp 20 | Ht 67.0 in | Wt 302.0 lb

## 2019-01-01 DIAGNOSIS — Z6841 Body Mass Index (BMI) 40.0 and over, adult: Secondary | ICD-10-CM

## 2019-01-01 DIAGNOSIS — N3281 Overactive bladder: Secondary | ICD-10-CM | POA: Diagnosis not present

## 2019-01-01 DIAGNOSIS — G4733 Obstructive sleep apnea (adult) (pediatric): Secondary | ICD-10-CM | POA: Diagnosis not present

## 2019-01-01 DIAGNOSIS — M1712 Unilateral primary osteoarthritis, left knee: Secondary | ICD-10-CM | POA: Diagnosis not present

## 2019-01-01 DIAGNOSIS — I10 Essential (primary) hypertension: Secondary | ICD-10-CM

## 2019-01-01 DIAGNOSIS — F3341 Major depressive disorder, recurrent, in partial remission: Secondary | ICD-10-CM

## 2019-01-01 MED ORDER — MIRABEGRON ER 25 MG PO TB24: 25.0000 mg | ORAL_TABLET | Freq: Every day | ORAL | 1 refills | Status: DC

## 2019-01-01 NOTE — Patient Instructions (Addendum)
Stop Vesicare

## 2019-01-01 NOTE — Progress Notes (Signed)
Patient: Peter [name] Male    DOB: 1960-11-25   58 y.o.   MRN: [id number] Visit Date: 01/01/2019  Today's Provider: [name], MD   Chief Complaint  Patient presents with  . Follow-up   Subjective:    HPI Patient comes in today for a follow up. He is wanting to discuss medications for OAB. He reports that Vesicare is not helping. Also failed Ditropan. He is also considering knee replacement surgery and wanted to get a recommendation of possible surgery.  Chronic left knee pain. Allergies  Allergen Reactions  . Oxycodone Other (See Comments)    Causes Hallucinations, auditory and tactile  . Isosorbide     Headache, nausea, vomiting     Current Outpatient Medications:  .  amLODipine (NORVASC) 10 MG tablet, TAKE 1 TABLET BY MOUTH EVERY DAY, Disp: 90 tablet, Rfl: 3 .  aspirin EC 81 MG tablet, Take 81 mg by mouth daily., Disp: , Rfl:  .  atorvastatin (LIPITOR) 40 MG tablet, , Disp: , Rfl: 3 .  carbamazepine (TEGRETOL) 200 MG tablet, 3 po q am, 2 qhs., Disp: 450 tablet, Rfl: 1 .  clobetasol ointment (TEMOVATE) 0.05 %, , Disp: , Rfl: 1 .  clonazePAM (KLONOPIN) 0.5 MG tablet, Take 1 tablet (0.5 mg total) by mouth every 6 (six) hours as needed. for anxiety, Disp: 120 tablet, Rfl: 5 .  famotidine (PEPCID) 40 MG tablet, Take 1 tablet (40 mg total) by mouth daily., Disp: 30 tablet, Rfl: 5 .  Garlic 10 MG CAPS, Take by mouth daily., Disp: , Rfl:  .  hydrALAZINE (APRESOLINE) 100 MG tablet, TAKE 1 TABLET(100 MG) BY MOUTH THREE TIMES DAILY, Disp: 270 tablet, Rfl: 3 .  ibuprofen (ADVIL,MOTRIN) 200 MG tablet, Take 800 mg by mouth 3 (three) times daily as needed for headache or moderate pain., Disp: , Rfl:  .  lamoTRIgine (LAMICTAL) 200 MG tablet, Take 1 tablet (200 mg total) by mouth at bedtime., Disp: 90 tablet, Rfl: 1 .  losartan (COZAAR) 100 MG tablet, TAKE 1 TABLET(100 MG) BY MOUTH DAILY, Disp: 90 tablet, Rfl: 3 .  LUMIGAN 0.01 % SOLN, INT 1 GTT IN OU QD IN THE EVE,  Disp: , Rfl: 7 .  metoprolol tartrate (LOPRESSOR) 50 MG tablet, TAKE 1 TABLET(50 MG) BY MOUTH TWICE DAILY, Disp: 180 tablet, Rfl: 3 .  NON FORMULARY, CPAP, Disp: , Rfl:  .  Omega-3 1000 MG CAPS, Take by mouth daily., Disp: , Rfl:  .  pantoprazole (PROTONIX) 40 MG tablet, Take 1 tablet (40 mg total) by mouth 2 (two) times daily., Disp: 60 tablet, Rfl: 11 .  RUTIN PO, Take by mouth daily., Disp: , Rfl:  .  sertraline (ZOLOFT) 100 MG tablet, Take 100 mg by mouth daily., Disp: , Rfl:  .  solifenacin (VESICARE) 5 MG tablet, Take 1 tablet (5 mg total) by mouth daily., Disp: 30 tablet, Rfl: 5 .  spironolactone (ALDACTONE) 50 MG tablet, TAKE 1 TABLET(50 MG) BY MOUTH DAILY, Disp: 30 tablet, Rfl: 11 .  vitamin C (ASCORBIC ACID) 500 MG tablet, Take 500 mg by mouth daily., Disp: , Rfl:  .  tolterodine (DETROL LA) 4 MG 24 hr capsule, Take 1 capsule (4 mg total) by mouth daily. (Patient not taking: Reported on 12/13/2018), Disp: 30 capsule, Rfl: 12  Review of Systems  Constitutional: Negative for activity change, appetite change, chills and fatigue.  Cardiovascular: Positive for leg swelling.       Leg swells at  times.    Genitourinary: Positive for frequency and urgency. Negative for decreased urine volume, difficulty urinating, dysuria, flank pain, hematuria, penile pain, penile swelling, scrotal swelling and testicular pain.  Musculoskeletal: Positive for arthralgias and back pain.  Neurological: Negative for dizziness, light-headedness and headaches.  Psychiatric/Behavioral: Negative for agitation, self-injury, sleep disturbance and suicidal ideas. The patient is not nervous/anxious.     Social History   Tobacco Use  . Smoking status: Never Smoker  . Smokeless tobacco: Never Used  Substance Use Topics  . Alcohol use: Yes    Comment: occasionally      Objective:   BP 130/82   Pulse 65   Temp 98.8 F (37.1 C)   Resp 20   Ht 5\' 7"  (1.702 m)   Wt (!) 302 lb (137 kg)   SpO2 98%   BMI 47.30  kg/m  Vitals:   01/01/19 1554  BP: 130/82  Pulse: 65  Resp: 20  Temp: 98.8 F (37.1 C)  SpO2: 98%  Weight: (!) 302 lb (137 kg)  Height: 5\' 7"  (1.702 m)     Physical Exam Vitals signs reviewed.  Constitutional:      Appearance: He is obese.  HENT:     Head: Normocephalic and atraumatic.     Right Ear: External ear normal.     Left Ear: External ear normal.     Nose: Nose normal.  Eyes:     General: No scleral icterus.    Conjunctiva/sclera: Conjunctivae normal.  Cardiovascular:     Rate and Rhythm: Normal rate and regular rhythm.     Heart sounds: Normal heart sounds.  Pulmonary:     Effort: Pulmonary effort is normal.     Breath sounds: Normal breath sounds.  Abdominal:     Tenderness: There is no abdominal tenderness.  Musculoskeletal:     Comments: Trace bilat edema. He walks with cane but is steadier.  Lymphadenopathy:     Cervical: No cervical adenopathy.  Skin:    General: Skin is warm and dry.  Neurological:     Mental Status: He is alert and oriented to person, place, and time. Mental status is at baseline.  Psychiatric:        Mood and Affect: Mood normal.        Behavior: Behavior normal.        Thought Content: Thought content normal.        Judgment: Judgment normal.      No results found for any visits on 01/01/19.     Assessment & Plan    1. Overactive bladder May need urology referral. - mirabegron ER (MYRBETRIQ) 25 MG TB24 tablet; Take 1 tablet (25 mg total) by mouth daily.  Dispense: 30 tablet; Refill: 1  2. Primary osteoarthritis of left knee Refer to dr Harlow Mares. - Ambulatory referral to Orthopedic Surgery  3. Essential (primary) hypertension   4. Obstructive sleep apnea syndrome   5. Recurrent major depressive disorder, in partial remission [facility]) Per psychiatry.  6. Class 3 severe obesity due to excess calories with serious comorbidity and body mass index (BMI) of 45.0 to 49.9 in adult [facility]) With HTN/OSA/OA     [name], MD  [facility]

## 2019-01-23 ENCOUNTER — Other Ambulatory Visit: Payer: Self-pay

## 2019-01-23 ENCOUNTER — Ambulatory Visit: Payer: Managed Care, Other (non HMO) | Admitting: Family Medicine

## 2019-01-23 ENCOUNTER — Encounter: Payer: Self-pay | Admitting: Family Medicine

## 2019-01-23 VITALS — BP 136/78 | HR 70 | Temp 98.2°F | Resp 20 | Wt 299.0 lb

## 2019-01-23 DIAGNOSIS — Z6841 Body Mass Index (BMI) 40.0 and over, adult: Secondary | ICD-10-CM

## 2019-01-23 DIAGNOSIS — Z01818 Encounter for other preprocedural examination: Secondary | ICD-10-CM

## 2019-01-23 DIAGNOSIS — R739 Hyperglycemia, unspecified: Secondary | ICD-10-CM

## 2019-01-23 DIAGNOSIS — G4733 Obstructive sleep apnea (adult) (pediatric): Secondary | ICD-10-CM | POA: Diagnosis not present

## 2019-01-23 DIAGNOSIS — I10 Essential (primary) hypertension: Secondary | ICD-10-CM

## 2019-01-23 LAB — POCT URINALYSIS DIPSTICK
Bilirubin, UA: NEGATIVE
Blood, UA: NEGATIVE
Glucose, UA: NEGATIVE
Ketones, UA: NEGATIVE
Leukocytes, UA: NEGATIVE
Nitrite, UA: NEGATIVE
Protein, UA: NEGATIVE
Spec Grav, UA: >=1.03 — AB (ref 1.010–1.025)
Urobilinogen, UA: 0.2 E.U./dL
pH, UA: 6 (ref 5.0–8.0)

## 2019-01-23 MED ORDER — EPLERENONE 25 MG PO TABS: 25.0000 mg | ORAL_TABLET | Freq: Every day | ORAL | 3 refills | Status: DC

## 2019-01-23 NOTE — Patient Instructions (Signed)
Discontinue Mybetriq.  Start OTC Prilosec.

## 2019-01-23 NOTE — Progress Notes (Signed)
Patient: Peter [name] Male    DOB: 08/05/1960   58 y.o.   MRN: [id number] Visit Date: 01/23/2019  Today's Provider: [name], MD   No chief complaint on file.  Subjective:   HPI Patient comes in today for a surgical clearance. He feels well today with no complaints. He is having a total knee replacement by Dr. Harlow Mares on 02/23/2019. Overall he is feeling pretty well. 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  Allergies  Allergen Reactions  . Oxycodone Other (See Comments)    Causes Hallucinations, auditory and tactile  . Isosorbide     Headache, nausea, vomiting     Current Outpatient Medications:  .  amLODipine (NORVASC) 10 MG tablet, TAKE 1 TABLET BY MOUTH EVERY DAY, Disp: 90 tablet, Rfl: 3 .  aspirin EC 81 MG tablet, Take 81 mg by mouth daily., Disp: , Rfl:  .  atorvastatin (LIPITOR) 40 MG tablet, , Disp: , Rfl: 3 .  carbamazepine (TEGRETOL) 200 MG tablet, 3 po q am, 2 qhs., Disp: 450 tablet, Rfl: 1 .  clobetasol ointment (TEMOVATE) 0.05 %, , Disp: , Rfl: 1 .  clonazePAM (KLONOPIN) 0.5 MG tablet, Take 1 tablet (0.5 mg total) by mouth every 6 (six) hours as needed. for anxiety, Disp: 120 tablet, Rfl: 5 .  famotidine (PEPCID) 40 MG tablet, Take 1 tablet (40 mg total) by mouth daily., Disp: 30 tablet, Rfl: 5 .  Garlic 10 MG CAPS, Take by mouth daily., Disp: , Rfl:  .  hydrALAZINE (APRESOLINE) 100 MG tablet, TAKE 1 TABLET(100 MG) BY MOUTH THREE TIMES DAILY, Disp: 270 tablet, Rfl: 3 .  ibuprofen (ADVIL,MOTRIN) 200 MG tablet, Take 800 mg by mouth 3 (three) times daily as needed for headache or moderate pain., Disp: , Rfl:  .  lamoTRIgine (LAMICTAL) 200 MG tablet, Take 1 tablet (200 mg total) by mouth at bedtime., Disp: 90 tablet, Rfl: 1 .  losartan (COZAAR) 100 MG tablet, TAKE 1 TABLET(100 MG) BY MOUTH DAILY, Disp: 90 tablet, Rfl: 3 .  LUMIGAN 0.01 % SOLN, INT 1 GTT IN OU QD IN THE EVE, Disp: , Rfl: 7 .  metoprolol tartrate  (LOPRESSOR) 50 MG tablet, TAKE 1 TABLET(50 MG) BY MOUTH TWICE DAILY, Disp: 180 tablet, Rfl: 3 .  mirabegron ER (MYRBETRIQ) 25 MG TB24 tablet, Take 1 tablet (25 mg total) by mouth daily., Disp: 30 tablet, Rfl: 1 .  NON FORMULARY, CPAP, Disp: , Rfl:  .  Omega-3 1000 MG CAPS, Take by mouth daily., Disp: , Rfl:  .  pantoprazole (PROTONIX) 40 MG tablet, Take 1 tablet (40 mg total) by mouth 2 (two) times daily., Disp: 60 tablet, Rfl: 11 .  RUTIN PO, Take by mouth daily., Disp: , Rfl:  .  sertraline (ZOLOFT) 100 MG tablet, Take 100 mg by mouth daily., Disp: , Rfl:  .  solifenacin (VESICARE) 5 MG tablet, Take 1 tablet (5 mg total) by mouth daily., Disp: 30 tablet, Rfl: 5 .  spironolactone (ALDACTONE) 50 MG tablet, TAKE 1 TABLET(50 MG) BY MOUTH DAILY, Disp: 30 tablet, Rfl: 11 .  tolterodine (DETROL LA) 4 MG 24 hr capsule, Take 1 capsule (4 mg total) by mouth daily. (Patient not taking: Reported on 12/13/2018), Disp: 30 capsule, Rfl: 12 .  vitamin C (ASCORBIC ACID) 500 MG tablet, Take 500 mg by mouth daily., Disp: , Rfl:   Review of Systems  Constitutional: Negative for activity change and  fatigue.  Respiratory: Negative for cough and shortness of breath.   Cardiovascular: Negative for chest pain, palpitations and leg swelling.  Endocrine: Negative for cold intolerance, heat intolerance, polydipsia, polyphagia and polyuria.  Musculoskeletal: Positive for arthralgias.  Skin: Negative for pallor, rash and wound.  Neurological: Negative for dizziness, light-headedness and headaches.  Hematological: Negative for adenopathy. Does not bruise/bleed easily.  Psychiatric/Behavioral: Negative for agitation, self-injury and suicidal ideas. The patient is not nervous/anxious.     Social History   Tobacco Use  . Smoking status: Never Smoker  . Smokeless tobacco: Never Used  Substance Use Topics  . Alcohol use: Yes    Comment: occasionally      Objective:   There were no vitals taken for this visit.  There were no vitals filed for this visit.   Physical Exam Vitals signs reviewed.  Constitutional:      Appearance: He is well-developed.     Comments: Obese white male in no acute distress  HENT:     Head: Normocephalic and atraumatic.     Right Ear: External ear normal.     Left Ear: External ear normal.     Nose: Nose normal.  Eyes:     General: No scleral icterus.    Conjunctiva/sclera: Conjunctivae normal.  Neck:     Thyroid: No thyromegaly.     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     Heart sounds: Normal heart sounds.  Pulmonary:     Effort: Pulmonary effort is normal.     Breath sounds: Normal breath sounds.  Abdominal:     General: Bowel sounds are normal. There is no distension.     Tenderness: There is no abdominal tenderness.     Comments: Midline scar is surrounded by can very firm scar tissue probably 3 inches on either side of the scar.  Skin:    General: Skin is warm and dry.  Neurological:     Mental Status: He is alert and oriented to person, place, and time.     Cranial Nerves: No cranial nerve deficit.     Sensory: No sensory deficit.     Motor: No abnormal muscle tone.  Psychiatric:        Mood and Affect: Mood normal.        Behavior: Behavior normal.        Thought Content: Thought content normal.        Judgment: Judgment normal.    ECG--NSR  No results found for any visits on 01/23/19.     Assessment & Plan    1. Pre-op examination Pt cleared for surgery/TKR. - EKG 12-Lead - POCT urinalysis dipstick  2. Essential (primary) hypertension  - eplerenone (INSPRA) 25 MG tablet; Take 1 tablet (25 mg total) by mouth daily.  Dispense: 90 tablet; Refill: 3 - Comprehensive metabolic panel - CBC with Differential/Platelet  3. Hyperglycemia  - Hemoglobin A1c 4.Morbid Obesity    [name], MD  [facility]

## 2019-01-24 LAB — COMPREHENSIVE METABOLIC PANEL
ALT: 16 IU/L (ref 0–44)
AST: 18 IU/L (ref 0–40)
Albumin/Globulin Ratio: 1.5 (ref 1.2–2.2)
Albumin: 4 g/dL (ref 3.8–4.9)
Alkaline Phosphatase: 114 IU/L (ref 39–117)
BUN/Creatinine Ratio: 21 (ref 10–24)
BUN: 21 mg/dL (ref 8–27)
Bilirubin Total: 0.3 mg/dL (ref 0.0–1.2)
CO2: 22 mmol/L (ref 20–29)
Calcium: 8.8 mg/dL (ref 8.6–10.2)
Chloride: 105 mmol/L (ref 96–106)
Creatinine, Ser: 1.01 mg/dL (ref 0.76–1.27)
GFR calc Af Amer: 93 mL/min/{1.73_m2} (ref >59)
GFR calc non Af Amer: 80 mL/min/{1.73_m2} (ref >59)
Globulin, Total: 2.6 g/dL (ref 1.5–4.5)
Glucose: 86 mg/dL (ref 65–99)
Potassium: 4 mmol/L (ref 3.5–5.2)
Sodium: 141 mmol/L (ref 134–144)
Total Protein: 6.6 g/dL (ref 6.0–8.5)

## 2019-01-24 LAB — CBC WITH DIFFERENTIAL/PLATELET
Basophils Absolute: 0.1 10*3/uL (ref 0.0–0.2)
Basos: 1 %
EOS (ABSOLUTE): 0.1 10*3/uL (ref 0.0–0.4)
Eos: 2 %
Hematocrit: 42.4 % (ref 37.5–51.0)
Hemoglobin: 14.4 g/dL (ref 13.0–17.7)
Immature Grans (Abs): 0 10*3/uL (ref 0.0–0.1)
Immature Granulocytes: 1 %
Lymphocytes Absolute: 2.1 10*3/uL (ref 0.7–3.1)
Lymphs: 32 %
MCH: 29.9 pg (ref 26.6–33.0)
MCHC: 34 g/dL (ref 31.5–35.7)
MCV: 88 fL (ref 79–97)
Monocytes Absolute: 0.5 10*3/uL (ref 0.1–0.9)
Monocytes: 8 %
Neutrophils Absolute: 3.7 10*3/uL (ref 1.4–7.0)
Neutrophils: 56 %
Platelets: 188 10*3/uL (ref 150–450)
RBC: 4.82 x10E6/uL (ref 4.14–5.80)
RDW: 12.6 % (ref 11.6–15.4)
WBC: 6.4 10*3/uL (ref 3.4–10.8)

## 2019-01-24 LAB — HEMOGLOBIN A1C
Est. average glucose Bld gHb Est-mCnc: 97 mg/dL
Hgb A1c MFr Bld: 5 % (ref 4.8–5.6)

## 2019-01-25 ENCOUNTER — Telehealth: Payer: Self-pay

## 2019-01-25 NOTE — Telephone Encounter (Signed)
Patient was advised.  

## 2019-01-25 NOTE — Telephone Encounter (Signed)
-----   Message from Jerrol Banana., MD sent at 01/24/2019  8:55 AM EDT ----- Labs good.

## 2019-01-29 MED ORDER — SERTRALINE HCL 100 MG PO TABS: 100.0000 mg | ORAL_TABLET | Freq: Every day | ORAL | 3 refills | Status: DC

## 2019-01-29 MED ORDER — LOSARTAN POTASSIUM 100 MG PO TABS: 100.0000 mg | ORAL_TABLET | Freq: Every day | ORAL | 3 refills | Status: DC

## 2019-01-29 MED ORDER — HYDRALAZINE HCL 100 MG PO TABS: 100.0000 mg | ORAL_TABLET | Freq: Three times a day (TID) | ORAL | 3 refills | Status: DC

## 2019-01-29 MED ORDER — ATORVASTATIN CALCIUM 40 MG PO TABS: 40.0000 mg | ORAL_TABLET | Freq: Every day | ORAL | 3 refills | Status: DC

## 2019-01-29 MED ORDER — METOPROLOL TARTRATE 50 MG PO TABS: 50.0000 mg | ORAL_TABLET | Freq: Two times a day (BID) | ORAL | 3 refills | Status: DC

## 2019-02-03 LAB — CBC WITH DIFFERENTIAL/PLATELET
Basophils Absolute: 0.1 10*3/uL (ref 0.0–0.2)
Basos: 1 %
EOS (ABSOLUTE): 0.2 10*3/uL (ref 0.0–0.4)
Eos: 3 %
Hematocrit: 44.1 % (ref 37.5–51.0)
Hemoglobin: 14.6 g/dL (ref 13.0–17.7)
Immature Grans (Abs): 0 10*3/uL (ref 0.0–0.1)
Immature Granulocytes: 1 %
Lymphocytes Absolute: 2 10*3/uL (ref 0.7–3.1)
Lymphs: 31 %
MCH: 29.4 pg (ref 26.6–33.0)
MCHC: 33.1 g/dL (ref 31.5–35.7)
MCV: 89 fL (ref 79–97)
Monocytes Absolute: 0.4 10*3/uL (ref 0.1–0.9)
Monocytes: 7 %
Neutrophils Absolute: 3.7 10*3/uL (ref 1.4–7.0)
Neutrophils: 57 %
Platelets: 218 10*3/uL (ref 150–450)
RBC: 4.97 x10E6/uL (ref 4.14–5.80)
RDW: 12.3 % (ref 11.6–15.4)
WBC: 6.4 10*3/uL (ref 3.4–10.8)

## 2019-02-03 LAB — LAMOTRIGINE LEVEL: Lamotrigine Lvl: 2.1 ug/mL (ref 2.0–20.0)

## 2019-02-03 LAB — COMPREHENSIVE METABOLIC PANEL
ALT: 15 IU/L (ref 0–44)
AST: 19 IU/L (ref 0–40)
Albumin/Globulin Ratio: 1.6 (ref 1.2–2.2)
Albumin: 4.1 g/dL (ref 3.8–4.9)
Alkaline Phosphatase: 123 IU/L — ABNORMAL HIGH (ref 39–117)
BUN/Creatinine Ratio: 21 (ref 10–24)
BUN: 22 mg/dL (ref 8–27)
Bilirubin Total: 0.3 mg/dL (ref 0.0–1.2)
CO2: 22 mmol/L (ref 20–29)
Calcium: 9.1 mg/dL (ref 8.6–10.2)
Chloride: 105 mmol/L (ref 96–106)
Creatinine, Ser: 1.04 mg/dL (ref 0.76–1.27)
GFR calc Af Amer: 90 mL/min/{1.73_m2} (ref >59)
GFR calc non Af Amer: 78 mL/min/{1.73_m2} (ref >59)
Globulin, Total: 2.5 g/dL (ref 1.5–4.5)
Glucose: 91 mg/dL (ref 65–99)
Potassium: 4.3 mmol/L (ref 3.5–5.2)
Sodium: 142 mmol/L (ref 134–144)
Total Protein: 6.6 g/dL (ref 6.0–8.5)

## 2019-02-03 LAB — CARBAMAZEPINE LEVEL, TOTAL: Carbamazepine (Tegretol), S: 11.5 ug/mL (ref 4.0–12.0)

## 2019-02-05 ENCOUNTER — Other Ambulatory Visit: Payer: Self-pay | Admitting: Physician Assistant

## 2019-02-05 DIAGNOSIS — Z79899 Other long term (current) drug therapy: Secondary | ICD-10-CM

## 2019-02-05 NOTE — Progress Notes (Signed)
Please call him.  Carbamazepine level is upper limit of normal, which is good.  Keep taking the same dose.  Lamictal level is okay. CBC nl, CMP nl except one of liver tests is slightly elevated.  I don't think it's significant b/c it's been higher in the past and no need to worry.  B/C CBZ level is almost too high, let's repeat the test in about 6 weeks, just to make sure it doesn't go up for any reason.  I'll order lab, please mail him the paper order.  Thanks

## 2019-02-06 ENCOUNTER — Ambulatory Visit: Payer: BLUE CROSS/BLUE SHIELD | Admitting: Physician Assistant

## 2019-02-27 ENCOUNTER — Ambulatory Visit: Payer: Self-pay | Admitting: Orthopedic Surgery

## 2019-03-05 ENCOUNTER — Telehealth: Payer: Managed Care, Other (non HMO) | Admitting: Family Medicine

## 2019-03-06 ENCOUNTER — Other Ambulatory Visit: Payer: Self-pay

## 2019-03-06 ENCOUNTER — Ambulatory Visit: Payer: Managed Care, Other (non HMO) | Admitting: Family Medicine

## 2019-03-06 ENCOUNTER — Ambulatory Visit
Admission: RE | Admit: 2019-03-06 | Discharge: 2019-03-06 | Disposition: A | Discharge status: Home / Self Care | Payer: Managed Care, Other (non HMO) | Source: Ambulatory Visit | Attending: Family Medicine | Admitting: Family Medicine

## 2019-03-06 ENCOUNTER — Encounter: Payer: Self-pay | Admitting: Family Medicine

## 2019-03-06 VITALS — BP 143/78 | HR 69 | Temp 97.9°F | Resp 18 | Wt 294.6 lb

## 2019-03-06 DIAGNOSIS — G44309 Post-traumatic headache, unspecified, not intractable: Secondary | ICD-10-CM

## 2019-03-06 DIAGNOSIS — W19XXXA Unspecified fall, initial encounter: Secondary | ICD-10-CM | POA: Diagnosis not present

## 2019-03-06 MED ORDER — KETOROLAC TROMETHAMINE 60 MG/2ML IM SOLN
60.0000 mg | Freq: Once | INTRAMUSCULAR | Status: AC
  Administered 2019-03-06: 60 mg via INTRAMUSCULAR

## 2019-03-06 NOTE — Progress Notes (Signed)
Patient: Peter [name] Male    DOB: Nov 30, 1960   58 y.o.   MRN: [id number] Visit Date: 03/06/2019  Today's Provider: [name], MD   Chief Complaint  Patient presents with  . Fall  . Headache   Subjective:     HPI Patient was stepping out of the tub slipped and hit his head on the tub on [address].He actually fell on the right lateral ribs and he is very sore in left thoracic and lower back. He also has a headche  He did not lose consciousness. He has been experiencing some dizziness, and severe headaches since it happened. Allergies  Allergen Reactions  . Oxycodone Other (See Comments)    Causes Hallucinations, auditory and tactile  . Isosorbide     Headache, nausea, vomiting     Current Outpatient Medications:  .  amLODipine (NORVASC) 10 MG tablet, TAKE 1 TABLET BY MOUTH EVERY DAY, Disp: 90 tablet, Rfl: 3 .  aspirin EC 81 MG tablet, Take 81 mg by mouth daily., Disp: , Rfl:  .  atorvastatin (LIPITOR) 40 MG tablet, Take 1 tablet (40 mg total) by mouth daily., Disp: 90 tablet, Rfl: 3 .  carbamazepine (TEGRETOL) 200 MG tablet, 3 po q am, 2 qhs., Disp: 450 tablet, Rfl: 1 .  eplerenone (INSPRA) 25 MG tablet, Take 1 tablet (25 mg total) by mouth daily., Disp: 90 tablet, Rfl: 3 .  Garlic 10 MG CAPS, Take by mouth daily., Disp: , Rfl:  .  hydrALAZINE (APRESOLINE) 100 MG tablet, Take 1 tablet (100 mg total) by mouth 3 (three) times daily., Disp: 270 tablet, Rfl: 3 .  ibuprofen (ADVIL,MOTRIN) 200 MG tablet, Take 800 mg by mouth 3 (three) times daily as needed for headache or moderate pain., Disp: , Rfl:  .  lamoTRIgine (LAMICTAL) 200 MG tablet, Take 1 tablet (200 mg total) by mouth at bedtime., Disp: 90 tablet, Rfl: 1 .  losartan (COZAAR) 100 MG tablet, Take 1 tablet (100 mg total) by mouth daily., Disp: 90 tablet, Rfl: 3 .  LUMIGAN 0.01 % SOLN, INT 1 GTT IN OU QD IN THE EVE, Disp: , Rfl: 7 .  metoprolol tartrate (LOPRESSOR) 50 MG tablet, Take 1 tablet (50 mg total) by mouth  2 (two) times daily., Disp: 180 tablet, Rfl: 3 .  Omega-3 1000 MG CAPS, Take by mouth daily., Disp: , Rfl:  .  sertraline (ZOLOFT) 100 MG tablet, Take 1 tablet (100 mg total) by mouth daily., Disp: 90 tablet, Rfl: 3 .  clobetasol ointment (TEMOVATE) 0.05 %, , Disp: , Rfl: 1 .  clonazePAM (KLONOPIN) 0.5 MG tablet, Take 1 tablet (0.5 mg total) by mouth every 6 (six) hours as needed. for anxiety (Patient not taking: Reported on 03/06/2019), Disp: 120 tablet, Rfl: 5 .  famotidine (PEPCID) 40 MG tablet, Take 1 tablet (40 mg total) by mouth daily. (Patient not taking: Reported on 03/06/2019), Disp: 30 tablet, Rfl: 5 .  NON FORMULARY, CPAP, Disp: , Rfl:  .  RUTIN PO, Take by mouth daily., Disp: , Rfl:  .  solifenacin (VESICARE) 5 MG tablet, Take 1 tablet (5 mg total) by mouth daily. (Patient not taking: Reported on 03/06/2019), Disp: 30 tablet, Rfl: 5 .  tolterodine (DETROL LA) 4 MG 24 hr capsule, Take 1 capsule (4 mg total) by mouth daily. (Patient not taking: Reported on 12/13/2018), Disp: 30 capsule, Rfl: 12 .  vitamin C (ASCORBIC ACID) 500 MG tablet, Take 500 mg by mouth daily., Disp: ,  Rfl:   Review of Systems  Constitutional: Negative.   Eyes: Negative.   Respiratory: Negative.   Cardiovascular: Negative.   Gastrointestinal: Negative.   Musculoskeletal: Positive for back pain.  Allergic/Immunologic: Negative.   Neurological: Positive for dizziness and headaches.  Hematological: Negative.   Psychiatric/Behavioral: Negative.   All other systems reviewed and are negative.   Social History   Tobacco Use  . Smoking status: Never Smoker  . Smokeless tobacco: Never Used  Substance Use Topics  . Alcohol use: Yes    Comment: occasionally      Objective:   BP (!) 143/78 (BP Location: Left Arm, Patient Position: Sitting, Cuff Size: Large)   Pulse 69   Temp 97.9 F (36.6 C) (Temporal)   Resp 18   Wt 294 lb 9.6 oz (133.6 kg)   SpO2 93%   BMI 46.14 kg/m  Vitals:   03/06/19 0812  BP: (!)  143/78  Pulse: 69  Resp: 18  Temp: 97.9 F (36.6 C)  TempSrc: Temporal  SpO2: 93%  Weight: 294 lb 9.6 oz (133.6 kg)  Body mass index is 46.14 kg/m.   Physical Exam Vitals signs reviewed.  Constitutional:      Appearance: He is obese.  HENT:     Head: Normocephalic and atraumatic.     Comments: Nontender. Eyes:     Extraocular Movements: Extraocular movements intact.     Pupils: Pupils are equal, round, and reactive to light.  Neck:     Musculoskeletal: Neck supple.  Cardiovascular:     Rate and Rhythm: Normal rate and regular rhythm.     Heart sounds: Normal heart sounds.  Pulmonary:     Effort: Pulmonary effort is normal.     Breath sounds: Normal breath sounds.  Musculoskeletal:     Comments: Cspine and ribs nontender.  Neurological:     Mental Status: He is alert.     Cranial Nerves: No cranial nerve deficit.     Motor: No weakness.  Psychiatric:        Mood and Affect: Mood normal.        Speech: Speech normal.        Behavior: Behavior normal.      No results found for any visits on 03/06/19.     Assessment & Plan    1. Post-traumatic headache, not intractable, unspecified chronicity pattern CT to r/o bleed. - CT Head Wo Contrast; Future - ketorolac (TORADOL) injection 60 mg  2. Fall, initial encounter [facility] pain of ribs/back. No finding that suggest pt needs imaging for this.      Cranford Mon, MD  [facility]

## 2019-03-15 ENCOUNTER — Ambulatory Visit: Payer: BC Managed Care – PPO | Admitting: Physician Assistant

## 2019-03-15 ENCOUNTER — Encounter
Admission: RE | Admit: 2019-03-15 | Discharge: 2019-03-15 | Disposition: A | Discharge status: Home / Self Care | Payer: Managed Care, Other (non HMO) | Source: Ambulatory Visit | Attending: Orthopedic Surgery | Admitting: Orthopedic Surgery

## 2019-03-15 ENCOUNTER — Ambulatory Visit: Payer: Self-pay | Admitting: Family Medicine

## 2019-03-15 ENCOUNTER — Ambulatory Visit: Payer: Managed Care, Other (non HMO) | Admitting: Family Medicine

## 2019-03-15 ENCOUNTER — Other Ambulatory Visit: Payer: Self-pay

## 2019-03-15 ENCOUNTER — Encounter: Payer: Self-pay | Admitting: Family Medicine

## 2019-03-15 ENCOUNTER — Other Ambulatory Visit: Payer: Self-pay | Admitting: Orthopedic Surgery

## 2019-03-15 VITALS — BP 132/78 | HR 77 | Temp 97.3°F | Resp 18 | Wt 292.0 lb

## 2019-03-15 DIAGNOSIS — R079 Chest pain, unspecified: Secondary | ICD-10-CM | POA: Diagnosis not present

## 2019-03-15 DIAGNOSIS — Z23 Encounter for immunization: Secondary | ICD-10-CM

## 2019-03-15 DIAGNOSIS — R1310 Dysphagia, unspecified: Secondary | ICD-10-CM | POA: Diagnosis not present

## 2019-03-15 DIAGNOSIS — Z20828 Contact with and (suspected) exposure to other viral communicable diseases: Secondary | ICD-10-CM | POA: Diagnosis not present

## 2019-03-15 DIAGNOSIS — Z6841 Body Mass Index (BMI) 40.0 and over, adult: Secondary | ICD-10-CM

## 2019-03-15 DIAGNOSIS — Z01812 Encounter for preprocedural laboratory examination: Secondary | ICD-10-CM | POA: Insufficient documentation

## 2019-03-15 DIAGNOSIS — K219 Gastro-esophageal reflux disease without esophagitis: Secondary | ICD-10-CM | POA: Diagnosis not present

## 2019-03-15 HISTORY — DX: Sleep apnea, unspecified: G47.30

## 2019-03-15 HISTORY — DX: Bipolar disorder, unspecified: F31.9

## 2019-03-15 HISTORY — DX: Dyspnea, unspecified: R06.00

## 2019-03-15 HISTORY — DX: Gastro-esophageal reflux disease without esophagitis: K21.9

## 2019-03-15 LAB — BASIC METABOLIC PANEL
Anion gap: 8 (ref 5–15)
BUN: 19 mg/dL (ref 6–20)
CO2: 27 mmol/L (ref 22–32)
Calcium: 8.7 mg/dL — ABNORMAL LOW (ref 8.9–10.3)
Chloride: 107 mmol/L (ref 98–111)
Creatinine, Ser: 0.95 mg/dL (ref 0.61–1.24)
GFR calc Af Amer: >60 mL/min (ref >60)
GFR calc non Af Amer: >60 mL/min (ref >60)
Glucose, Bld: 118 mg/dL — ABNORMAL HIGH (ref 70–99)
Potassium: 3.5 mmol/L (ref 3.5–5.1)
Sodium: 142 mmol/L (ref 135–145)

## 2019-03-15 LAB — CBC
HCT: 42.1 % (ref 39.0–52.0)
Hemoglobin: 14 g/dL (ref 13.0–17.0)
MCH: 29.5 pg (ref 26.0–34.0)
MCHC: 33.3 g/dL (ref 30.0–36.0)
MCV: 88.6 fL (ref 80.0–100.0)
Platelets: 189 10*3/uL (ref 150–400)
RBC: 4.75 MIL/uL (ref 4.22–5.81)
RDW: 12.8 % (ref 11.5–15.5)
WBC: 7.2 10*3/uL (ref 4.0–10.5)
nRBC: 0 % (ref 0.0–0.2)

## 2019-03-15 LAB — URINALYSIS, ROUTINE W REFLEX MICROSCOPIC
Bacteria, UA: NONE SEEN
Bilirubin Urine: NEGATIVE
Glucose, UA: NEGATIVE mg/dL
Hgb urine dipstick: NEGATIVE
Ketones, ur: NEGATIVE mg/dL
Nitrite: NEGATIVE
Protein, ur: NEGATIVE mg/dL
Specific Gravity, Urine: 1.026 (ref 1.005–1.030)
pH: 5 (ref 5.0–8.0)

## 2019-03-15 LAB — SURGICAL PCR SCREEN
MRSA, PCR: NEGATIVE
Staphylococcus aureus: NEGATIVE

## 2019-03-15 LAB — TYPE AND SCREEN
ABO/RH(D): B NEG
Antibody Screen: NEGATIVE

## 2019-03-15 LAB — APTT: aPTT: 29 seconds (ref 24–36)

## 2019-03-15 LAB — EKG 12-LEAD

## 2019-03-15 LAB — PROTIME-INR
INR: 1 (ref 0.8–1.2)
Prothrombin Time: 12.9 seconds (ref 11.4–15.2)

## 2019-03-15 MED ORDER — CIMETIDINE 400 MG PO TABS: 400.0000 mg | ORAL_TABLET | Freq: Two times a day (BID) | ORAL | 1 refills | Status: DC

## 2019-03-15 NOTE — Progress Notes (Signed)
Patient: Peter [name] Male    DOB: Sep 12, 1960   58 y.o.   MRN: [id number] Visit Date: 03/15/2019  Today's Provider: [name], MD   Chief Complaint  Patient presents with  . Gastroesophageal Reflux   Subjective:   HPI Patient comes in today for a follow up. He was last seen for his chronic issues 3 months ago. During that time, he was advised to discontinue Zantac and start Prilosec. Patient states that his throat seems to be closing after meals and experiencing heartburn as well. He was also seen in the office about 1 week ago due to a fall and is recovering well.  He did well on Zantac and Pepcid.but they are not available to pt. He has had a couple of episodes of a few seconds of nonexertional mid chest pain. BP Readings from Last 3 Encounters:  03/15/19 132/78  03/06/19 (!) 143/78  01/23/19 136/78   Wt Readings from Last 3 Encounters:  03/15/19 292 lb (132.5 kg)  03/06/19 294 lb 9.6 oz (133.6 kg)  01/23/19 299 lb (135.6 kg)    Allergies  Allergen Reactions  . Oxycodone Other (See Comments)    Causes Hallucinations, auditory and tactile  . Isosorbide     Headache, nausea, vomiting     Current Outpatient Medications:  .  aspirin EC 81 MG tablet, Take 81 mg by mouth daily., Disp: , Rfl:  .  atorvastatin (LIPITOR) 40 MG tablet, Take 1 tablet (40 mg total) by mouth daily., Disp: 90 tablet, Rfl: 3 .  carbamazepine (TEGRETOL) 200 MG tablet, 3 po q am, 2 qhs. (Patient taking differently: Take 400-600 mg by mouth See admin instructions. 3 po q am, 2 qhs.), Disp: 450 tablet, Rfl: 1 .  eplerenone (INSPRA) 50 MG tablet, Take 50 mg by mouth daily., Disp: , Rfl:  .  hydrALAZINE (APRESOLINE) 100 MG tablet, Take 1 tablet (100 mg total) by mouth 3 (three) times daily., Disp: 270 tablet, Rfl: 3 .  ibuprofen (ADVIL,MOTRIN) 200 MG tablet, Take 800 mg by mouth 3 (three) times daily as needed for headache or moderate pain., Disp: , Rfl:  .  lamoTRIgine (LAMICTAL) 200 MG  tablet, Take 1 tablet (200 mg total) by mouth at bedtime., Disp: 90 tablet, Rfl: 1 .  losartan (COZAAR) 100 MG tablet, Take 1 tablet (100 mg total) by mouth daily., Disp: 90 tablet, Rfl: 3 .  LUMIGAN 0.01 % SOLN, Place 1 drop into both eyes every morning. , Disp: , Rfl: 7 .  metoprolol tartrate (LOPRESSOR) 50 MG tablet, Take 1 tablet (50 mg total) by mouth 2 (two) times daily., Disp: 180 tablet, Rfl: 3 .  NON FORMULARY, CPAP, Disp: , Rfl:  .  Omega-3 1000 MG CAPS, Take 1,000 mg by mouth 3 (three) times daily. , Disp: , Rfl:  .  omeprazole (PRILOSEC) 40 MG capsule, Take 40 mg by mouth 2 (two) times daily., Disp: , Rfl:  .  sertraline (ZOLOFT) 100 MG tablet, Take 1 tablet (100 mg total) by mouth daily. (Patient taking differently: Take 100 mg by mouth 2 (two) times daily. ), Disp: 90 tablet, Rfl: 3  Review of Systems  Constitutional: Negative for activity change and fatigue.  Respiratory: Negative for cough and shortness of breath.   Cardiovascular: Negative for chest pain, palpitations and leg swelling.  Gastrointestinal:       Dysphagia of lower throat/neck with swallowing.  Endocrine: Negative for cold intolerance, heat intolerance, polydipsia, polyphagia and polyuria.  Musculoskeletal: Positive for arthralgias.  Skin: Negative for pallor, rash and wound.  Neurological: Negative for dizziness, light-headedness and headaches.  Hematological: Negative for adenopathy. Does not bruise/bleed easily.  Psychiatric/Behavioral: Negative for agitation, self-injury and suicidal ideas. The patient is not nervous/anxious.     Social History   Tobacco Use  . Smoking status: Never Smoker  . Smokeless tobacco: Never Used  Substance Use Topics  . Alcohol use: Yes    Comment: occasionally      Objective:   BP 132/78 (BP Location: Right Arm, Patient Position: Sitting, Cuff Size: Large)   Pulse 77   Temp (!) 97.3 F (36.3 C) (Temporal)   Resp 18   Wt 292 lb (132.5 kg)   SpO2 95%   BMI 45.73  kg/m  Vitals:   03/15/19 0827  BP: 132/78  Pulse: 77  Resp: 18  Temp: (!) 97.3 F (36.3 C)  TempSrc: Temporal  SpO2: 95%  Weight: 292 lb (132.5 kg)  Body mass index is 45.73 kg/m.   Physical Exam Vitals signs reviewed.  Constitutional:      Appearance: He is well-developed.     Comments: Obese white male in no acute distress  HENT:     Head: Normocephalic and atraumatic.     Right Ear: External ear normal.     Left Ear: External ear normal.     Nose: Nose normal.  Eyes:     General: No scleral icterus.    Conjunctiva/sclera: Conjunctivae normal.  Neck:     Thyroid: No thyromegaly.     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     Heart sounds: Normal heart sounds.  Pulmonary:     Effort: Pulmonary effort is normal.     Breath sounds: Normal breath sounds.  Abdominal:     General: Bowel sounds are normal. There is no distension.     Tenderness: There is no abdominal tenderness.     Comments: Midline scar is surrounded by can very firm scar tissue probably 3 inches on either side of the scar.  Skin:    General: Skin is warm and dry.  Neurological:     Mental Status: He is alert and oriented to person, place, and time.     Cranial Nerves: No cranial nerve deficit.     Sensory: No sensory deficit.     Motor: No abnormal muscle tone.  Psychiatric:        Mood and Affect: Mood normal.        Behavior: Behavior normal.        Thought Content: Thought content normal.        Judgment: Judgment normal.   ECG--sinus brady    No results found for any visits on 03/15/19.     Assessment & Plan    1. Dysphagia, unspecified type Try tagamet.More than 50% 25 minute visit spent in counseling or coordination of care  - cimetidine (TAGAMET) 400 MG tablet; Take 1 tablet (400 mg total) by mouth 2 (two) times daily. (Patient not taking: Reported on 03/15/2019)  Dispense: 90 tablet; Refill: 1  - Ambulatory referral to Gastroenterology  2. Need for immunization against influenza  - Flu Vaccine QUAD 6+ mos PF IM (Fluarix Quad PF)  3. Chest pain, unspecified type Noncardiac. - EKG 12-Lead  4. Gastroesophageal reflux disease, esophagitis presence not specified   5. Class 3 severe obesity due to excess calories with serious  comorbidity and body mass index (BMI) of 40.0 to 44.9 in adult [facility]) With OA/GERD.     Richard Cranford Mon, MD  [facility]

## 2019-03-15 NOTE — Patient Instructions (Signed)
Your procedure is scheduled on: 03/21/2019 Wed Report to Same Day Surgery 2nd floor medical mall [facility] Entrance-take elevator on left to 2nd floor.  Check in with surgery information desk.) To find out your arrival time please call [phone] between 1PM - 3PM on 03/20/2019 Tues  Remember: Instructions that are not followed completely may result in serious medical risk, up to and including death, or upon the discretion of your surgeon and anesthesiologist your surgery may need to be rescheduled.    _x___ 1. Do not eat food after midnight the night before your procedure. You may drink clear liquids up to 2 hours before you are scheduled to arrive at the hospital for your procedure.  Do not drink clear liquids within 2 hours of your scheduled arrival to the hospital.  Clear liquids include  --Water or Apple juice without pulp  --Clear carbohydrate beverage such as ClearFast or Gatorade  --Black Coffee or Clear Tea (No milk, no creamers, do not add anything to                  the coffee or Tea Type 1 and type 2 diabetics should only drink water.   ____Ensure clear carbohydrate drink on the way to the hospital for bariatric patients  _x___Ensure clear carbohydrate drink 3 hours before surgery.   No gum chewing or hard candies.     __x__ 2. No Alcohol for 24 hours before or after surgery.   __x__3. No Smoking or e-cigarettes for 24 prior to surgery.  Do not use any chewable tobacco products for at least 6 hour prior to surgery   ____  4. Bring all medications with you on the day of surgery if instructed.    __x__ 5. Notify your doctor if there is any change in your medical condition     (cold, fever, infections).    x___6. On the morning of surgery brush your teeth with toothpaste and water.  You may rinse your mouth with mouth wash if you wish.  Do not swallow any toothpaste or mouthwash.   Do not wear jewelry, make-up, hairpins, clips or nail polish.  Do not wear lotions,  powders, or perfumes. You may wear deodorant.  Do not shave 48 hours prior to surgery. Men may shave face and neck.  Do not bring valuables to the hospital.    [facility] is not responsible for any belongings or valuables.               Contacts, dentures or bridgework may not be worn into surgery.  Leave your suitcase in the car. After surgery it may be brought to your room.  For patients admitted to the hospital, discharge time is determined by your                       treatment team.  _  Patients discharged the day of surgery will not be allowed to drive home.  You will need someone to drive you home and stay with you the night of your procedure.    Please read over the following fact sheets that you were given:   [facility] Preparing for Surgery and or MRSA Information   _x___ Take anti-hypertensive listed below, cardiac, seizure, asthma,     anti-reflux and psychiatric medicines. These include:  1. carbamazepine (TEGRETOL) 200 MG tablet  2.eplerenone (INSPRA) 50 MG tablet  3.hydrALAZINE (APRESOLINE) 100 MG tablet  4.lamoTRIgine (LAMICTAL) 200 MG tablet  5.LUMIGAN 0.01 %  SOLN  6.metoprolol tartrate (LOPRESSOR) 50 MG tablet  7omeprazole (PRILOSEC) 40 MG capsule  8sertraline (ZOLOFT) 100 MG tablet  ____Fleets enema or Magnesium Citrate as directed.   _x___ Use CHG Soap or sage wipes as directed on instruction sheet   ____ Use inhalers on the day of surgery and bring to hospital day of surgery  ____ Stop Metformin and Janumet 2 days prior to surgery.    ____ Take 1/2 of usual insulin dose the night before surgery and none on the morning     surgery.   _x___ Follow recommendations from Cardiologist, Pulmonologist or PCP regarding          stopping Aspirin, Coumadin, Plavix ,Eliquis, Effient, or Pradaxa, and Pletal.  X____Stop Anti-inflammatories such as Advil, Aleve, Ibuprofen, Motrin, Naproxen, Naprosyn, Goodies powders or aspirin products. OK to take Tylenol and                           Celebrex.   _x___ Stop supplements until after surgery.  But may continue Vitamin D, Vitamin B,       and multivitamin.   __x__ Bring C-Pap to the hospital.

## 2019-03-15 NOTE — Pre-Procedure Instructions (Addendum)
Carbohydrate drink and incentive spirometry given along with instructions. Called Dr Alvira Philips office regarding discrepancy in surgical site.

## 2019-03-16 ENCOUNTER — Telehealth: Payer: Self-pay

## 2019-03-16 ENCOUNTER — Other Ambulatory Visit: Payer: Self-pay

## 2019-03-16 ENCOUNTER — Other Ambulatory Visit
Admission: RE | Admit: 2019-03-16 | Discharge: 2019-03-16 | Disposition: A | Discharge status: Home / Self Care | Payer: Managed Care, Other (non HMO) | Source: Ambulatory Visit | Attending: Orthopedic Surgery | Admitting: Orthopedic Surgery

## 2019-03-16 ENCOUNTER — Ambulatory Visit: Payer: Self-pay | Admitting: Orthopedic Surgery

## 2019-03-16 DIAGNOSIS — Z01812 Encounter for preprocedural laboratory examination: Secondary | ICD-10-CM | POA: Diagnosis not present

## 2019-03-16 LAB — SARS CORONAVIRUS 2 (TAT 6-24 HRS): SARS Coronavirus 2: NEGATIVE

## 2019-03-16 NOTE — Telephone Encounter (Signed)
Patient states that the Cimetidine is not covered by his insurance. Can he be prescribed something else?

## 2019-03-17 NOTE — Telephone Encounter (Signed)
Pantoprazole 40mg  BID for now.

## 2019-03-19 MED ORDER — PANTOPRAZOLE SODIUM 40 MG PO TBEC: 40.0000 mg | DELAYED_RELEASE_TABLET | Freq: Two times a day (BID) | ORAL | 1 refills | Status: DC

## 2019-03-19 NOTE — Telephone Encounter (Signed)
Patient notified of medication change.

## 2019-03-20 MED ORDER — DEXTROSE 5 % IV SOLN
3.0000 g | INTRAVENOUS | Status: DC
  Filled 2019-03-20: qty 3000

## 2019-03-20 MED ORDER — TRANEXAMIC ACID-NACL 1000-0.7 MG/100ML-% IV SOLN
1000.0000 mg | INTRAVENOUS | Status: AC
  Administered 2019-03-21: 1000 mg via INTRAVENOUS
  Filled 2019-03-20: qty 100

## 2019-03-21 ENCOUNTER — Inpatient Hospital Stay: Payer: Managed Care, Other (non HMO) | Admitting: Certified Registered Nurse Anesthetist

## 2019-03-21 ENCOUNTER — Inpatient Hospital Stay: Payer: Managed Care, Other (non HMO)

## 2019-03-21 ENCOUNTER — Inpatient Hospital Stay
Admission: RE | Admit: 2019-03-21 | Discharge: 2019-03-23 | DRG: 470 | Disposition: A | Discharge status: Home Health | Payer: Managed Care, Other (non HMO) | Attending: Orthopedic Surgery | Admitting: Orthopedic Surgery

## 2019-03-21 ENCOUNTER — Encounter
Admission: RE | Disposition: A | Discharge status: Home Health | Payer: Self-pay | Source: Home / Self Care | Attending: Orthopedic Surgery

## 2019-03-21 ENCOUNTER — Encounter: Payer: Self-pay | Admitting: *Deleted

## 2019-03-21 ENCOUNTER — Other Ambulatory Visit: Payer: Self-pay

## 2019-03-21 DIAGNOSIS — K219 Gastro-esophageal reflux disease without esophagitis: Secondary | ICD-10-CM | POA: Diagnosis present

## 2019-03-21 DIAGNOSIS — M1712 Unilateral primary osteoarthritis, left knee: Secondary | ICD-10-CM | POA: Diagnosis present

## 2019-03-21 DIAGNOSIS — Z8249 Family history of ischemic heart disease and other diseases of the circulatory system: Secondary | ICD-10-CM

## 2019-03-21 DIAGNOSIS — Z888 Allergy status to other drugs, medicaments and biological substances status: Secondary | ICD-10-CM | POA: Diagnosis not present

## 2019-03-21 DIAGNOSIS — Z8349 Family history of other endocrine, nutritional and metabolic diseases: Secondary | ICD-10-CM | POA: Diagnosis not present

## 2019-03-21 DIAGNOSIS — F319 Bipolar disorder, unspecified: Secondary | ICD-10-CM | POA: Diagnosis present

## 2019-03-21 DIAGNOSIS — G4730 Sleep apnea, unspecified: Secondary | ICD-10-CM | POA: Diagnosis present

## 2019-03-21 DIAGNOSIS — H409 Unspecified glaucoma: Secondary | ICD-10-CM | POA: Diagnosis present

## 2019-03-21 DIAGNOSIS — Z96652 Presence of left artificial knee joint: Secondary | ICD-10-CM

## 2019-03-21 DIAGNOSIS — I10 Essential (primary) hypertension: Secondary | ICD-10-CM | POA: Diagnosis present

## 2019-03-21 DIAGNOSIS — Z96659 Presence of unspecified artificial knee joint: Secondary | ICD-10-CM

## 2019-03-21 DIAGNOSIS — Z09 Encounter for follow-up examination after completed treatment for conditions other than malignant neoplasm: Secondary | ICD-10-CM

## 2019-03-21 DIAGNOSIS — Z885 Allergy status to narcotic agent status: Secondary | ICD-10-CM

## 2019-03-21 DIAGNOSIS — Z6841 Body Mass Index (BMI) 40.0 and over, adult: Secondary | ICD-10-CM | POA: Diagnosis not present

## 2019-03-21 HISTORY — PX: TOTAL KNEE ARTHROPLASTY: SHX125

## 2019-03-21 LAB — CBC
HCT: 41.4 % (ref 39.0–52.0)
Hemoglobin: 13.7 g/dL (ref 13.0–17.0)
MCH: 29.7 pg (ref 26.0–34.0)
MCHC: 33.1 g/dL (ref 30.0–36.0)
MCV: 89.6 fL (ref 80.0–100.0)
Platelets: 169 10*3/uL (ref 150–400)
RBC: 4.62 MIL/uL (ref 4.22–5.81)
RDW: 12.8 % (ref 11.5–15.5)
WBC: 6.4 10*3/uL (ref 4.0–10.5)
nRBC: 0 % (ref 0.0–0.2)

## 2019-03-21 LAB — BASIC METABOLIC PANEL
Anion gap: 8 (ref 5–15)
BUN: 23 mg/dL — ABNORMAL HIGH (ref 6–20)
CO2: 26 mmol/L (ref 22–32)
Calcium: 8.6 mg/dL — ABNORMAL LOW (ref 8.9–10.3)
Chloride: 108 mmol/L (ref 98–111)
Creatinine, Ser: 0.94 mg/dL (ref 0.61–1.24)
GFR calc Af Amer: >60 mL/min (ref >60)
GFR calc non Af Amer: >60 mL/min (ref >60)
Glucose, Bld: 92 mg/dL (ref 70–99)
Potassium: 4 mmol/L (ref 3.5–5.1)
Sodium: 142 mmol/L (ref 135–145)

## 2019-03-21 LAB — PROTIME-INR
INR: 1 (ref 0.8–1.2)
Prothrombin Time: 13.3 seconds (ref 11.4–15.2)

## 2019-03-21 LAB — APTT: aPTT: 27 seconds (ref 24–36)

## 2019-03-21 LAB — ABO/RH: ABO/RH(D): B NEG

## 2019-03-21 SURGERY — ARTHROPLASTY, KNEE, TOTAL
Anesthesia: Spinal | Site: Knee | Laterality: Left

## 2019-03-21 MED ORDER — ATORVASTATIN CALCIUM 20 MG PO TABS
40.0000 mg | ORAL_TABLET | Freq: Every day | ORAL | Status: DC
  Administered 2019-03-21 – 2019-03-23 (×3): 40 mg via ORAL
  Filled 2019-03-21 (×3): qty 2

## 2019-03-21 MED ORDER — FENTANYL CITRATE (PF) 100 MCG/2ML IJ SOLN
INTRAMUSCULAR | Status: AC
  Filled 2019-03-21: qty 2

## 2019-03-21 MED ORDER — CARBAMAZEPINE 200 MG PO TABS
400.0000 mg | ORAL_TABLET | Freq: Every day | ORAL | Status: DC
  Administered 2019-03-21 – 2019-03-22 (×2): 400 mg via ORAL
  Filled 2019-03-21 (×3): qty 2

## 2019-03-21 MED ORDER — MORPHINE SULFATE (PF) 4 MG/ML IV SOLN
0.5000 mg | INTRAVENOUS | Status: DC | PRN
  Administered 2019-03-22 – 2019-03-23 (×2): 1 mg via INTRAVENOUS
  Filled 2019-03-21 (×2): qty 1

## 2019-03-21 MED ORDER — LIDOCAINE HCL (CARDIAC) PF 100 MG/5ML IV SOSY
PREFILLED_SYRINGE | INTRAVENOUS | Status: DC | PRN
  Administered 2019-03-21: 50 mg via INTRAVENOUS

## 2019-03-21 MED ORDER — MIDAZOLAM HCL 5 MG/5ML IJ SOLN
INTRAMUSCULAR | Status: DC | PRN
  Administered 2019-03-21: 2 mg via INTRAVENOUS

## 2019-03-21 MED ORDER — LACTATED RINGERS IV SOLN: INTRAVENOUS | Status: DC

## 2019-03-21 MED ORDER — TRAMADOL HCL 50 MG PO TABS
50.0000 mg | ORAL_TABLET | Freq: Four times a day (QID) | ORAL | Status: DC
  Administered 2019-03-21 – 2019-03-23 (×7): 50 mg via ORAL
  Filled 2019-03-21 (×7): qty 1

## 2019-03-21 MED ORDER — ONDANSETRON HCL 4 MG PO TABS
4.0000 mg | ORAL_TABLET | Freq: Four times a day (QID) | ORAL | Status: DC | PRN
  Administered 2019-03-23: 4 mg via ORAL
  Filled 2019-03-21: qty 1

## 2019-03-21 MED ORDER — LIDOCAINE HCL (PF) 1 % IJ SOLN
INTRAMUSCULAR | Status: AC
  Filled 2019-03-21: qty 5

## 2019-03-21 MED ORDER — METOCLOPRAMIDE HCL 5 MG/ML IJ SOLN: 5.0000 mg | Freq: Three times a day (TID) | INTRAMUSCULAR | Status: DC | PRN

## 2019-03-21 MED ORDER — MIDAZOLAM HCL 2 MG/2ML IJ SOLN
1.0000 mg | Freq: Once | INTRAMUSCULAR | Status: AC
  Administered 2019-03-21: 11:00:00 1 mg via INTRAVENOUS

## 2019-03-21 MED ORDER — PHENYLEPHRINE HCL (PRESSORS) 10 MG/ML IV SOLN
INTRAVENOUS | Status: AC
  Filled 2019-03-21: qty 1

## 2019-03-21 MED ORDER — CEFAZOLIN SODIUM-DEXTROSE 2-4 GM/100ML-% IV SOLN
2.0000 g | Freq: Four times a day (QID) | INTRAVENOUS | Status: AC
  Administered 2019-03-21 (×2): 2 g via INTRAVENOUS
  Filled 2019-03-21 (×2): qty 100

## 2019-03-21 MED ORDER — LATANOPROST 0.005 % OP SOLN
1.0000 [drp] | Freq: Every day | OPHTHALMIC | Status: DC
  Filled 2019-03-21: qty 2.5

## 2019-03-21 MED ORDER — SODIUM CHLORIDE 0.9 % IV SOLN
INTRAVENOUS | Status: DC | PRN
  Administered 2019-03-21: 50 ug/min via INTRAVENOUS

## 2019-03-21 MED ORDER — CELECOXIB 200 MG PO CAPS
ORAL_CAPSULE | ORAL | Status: AC
  Administered 2019-03-21: 400 mg via ORAL
  Filled 2019-03-21: qty 2

## 2019-03-21 MED ORDER — MIDAZOLAM HCL 2 MG/2ML IJ SOLN
INTRAMUSCULAR | Status: AC
  Filled 2019-03-21: qty 2

## 2019-03-21 MED ORDER — BUPIVACAINE HCL (PF) 0.5 % IJ SOLN
INTRAMUSCULAR | Status: DC | PRN
  Administered 2019-03-21: 2.5 mL

## 2019-03-21 MED ORDER — LACTATED RINGERS IV SOLN
INTRAVENOUS | Status: DC
  Administered 2019-03-21: 10:00:00 via INTRAVENOUS

## 2019-03-21 MED ORDER — ONDANSETRON HCL 4 MG/2ML IJ SOLN: 4.0000 mg | Freq: Once | INTRAMUSCULAR | Status: DC | PRN

## 2019-03-21 MED ORDER — PROPOFOL 500 MG/50ML IV EMUL
INTRAVENOUS | Status: DC | PRN
  Administered 2019-03-21: 75 ug/kg/min via INTRAVENOUS

## 2019-03-21 MED ORDER — ONDANSETRON HCL 4 MG/2ML IJ SOLN
4.0000 mg | Freq: Four times a day (QID) | INTRAMUSCULAR | Status: DC | PRN
  Administered 2019-03-22 (×2): 4 mg via INTRAVENOUS
  Filled 2019-03-21 (×2): qty 2

## 2019-03-21 MED ORDER — PROPOFOL 10 MG/ML IV BOLUS
INTRAVENOUS | Status: AC
  Filled 2019-03-21: qty 40

## 2019-03-21 MED ORDER — SERTRALINE HCL 100 MG PO TABS
100.0000 mg | ORAL_TABLET | Freq: Every day | ORAL | Status: DC
  Administered 2019-03-22 – 2019-03-23 (×2): 100 mg via ORAL
  Filled 2019-03-21 (×2): qty 1
  Filled 2019-03-21 (×2): qty 2

## 2019-03-21 MED ORDER — METHOCARBAMOL 1000 MG/10ML IJ SOLN
500.0000 mg | Freq: Four times a day (QID) | INTRAVENOUS | Status: DC | PRN
  Filled 2019-03-21: qty 5

## 2019-03-21 MED ORDER — METOCLOPRAMIDE HCL 10 MG PO TABS
5.0000 mg | ORAL_TABLET | Freq: Three times a day (TID) | ORAL | Status: DC | PRN
  Administered 2019-03-22: 10 mg via ORAL
  Filled 2019-03-21: qty 1

## 2019-03-21 MED ORDER — CHLORHEXIDINE GLUCONATE 4 % EX LIQD: 60.0000 mL | Freq: Once | CUTANEOUS | Status: DC

## 2019-03-21 MED ORDER — FENTANYL CITRATE (PF) 100 MCG/2ML IJ SOLN
INTRAMUSCULAR | Status: AC
  Administered 2019-03-21: 50 ug via INTRAVENOUS
  Filled 2019-03-21: qty 2

## 2019-03-21 MED ORDER — METOPROLOL TARTRATE 50 MG PO TABS
50.0000 mg | ORAL_TABLET | Freq: Two times a day (BID) | ORAL | Status: DC
  Administered 2019-03-21 – 2019-03-23 (×4): 50 mg via ORAL
  Filled 2019-03-21 (×4): qty 1

## 2019-03-21 MED ORDER — TRANEXAMIC ACID-NACL 1000-0.7 MG/100ML-% IV SOLN
1000.0000 mg | INTRAVENOUS | Status: DC
  Filled 2019-03-21: qty 100

## 2019-03-21 MED ORDER — BUPIVACAINE LIPOSOME 1.3 % IJ SUSP: 20.0000 mL | Freq: Once | INTRAMUSCULAR | Status: DC

## 2019-03-21 MED ORDER — KETOROLAC TROMETHAMINE 15 MG/ML IJ SOLN
15.0000 mg | Freq: Four times a day (QID) | INTRAMUSCULAR | Status: AC
  Administered 2019-03-21 – 2019-03-22 (×4): 15 mg via INTRAVENOUS
  Filled 2019-03-21 (×4): qty 1

## 2019-03-21 MED ORDER — BUPIVACAINE LIPOSOME 1.3 % IJ SUSP
INTRAMUSCULAR | Status: AC
  Filled 2019-03-21: qty 20

## 2019-03-21 MED ORDER — PANTOPRAZOLE SODIUM 40 MG PO TBEC
40.0000 mg | DELAYED_RELEASE_TABLET | Freq: Two times a day (BID) | ORAL | Status: DC
  Administered 2019-03-21 – 2019-03-23 (×4): 40 mg via ORAL
  Filled 2019-03-21 (×5): qty 1

## 2019-03-21 MED ORDER — ONDANSETRON HCL 4 MG/2ML IJ SOLN
INTRAMUSCULAR | Status: DC | PRN
  Administered 2019-03-21: 4 mg via INTRAVENOUS

## 2019-03-21 MED ORDER — LAMOTRIGINE 100 MG PO TABS
200.0000 mg | ORAL_TABLET | Freq: Every day | ORAL | Status: DC
  Administered 2019-03-22: 200 mg via ORAL
  Filled 2019-03-21 (×2): qty 2

## 2019-03-21 MED ORDER — PHENOL 1.4 % MT LIQD
1.0000 | OROMUCOSAL | Status: DC | PRN
  Filled 2019-03-21: qty 177

## 2019-03-21 MED ORDER — FENTANYL CITRATE (PF) 100 MCG/2ML IJ SOLN
50.0000 ug | Freq: Once | INTRAMUSCULAR | Status: AC
  Administered 2019-03-21: 11:00:00 50 ug via INTRAVENOUS

## 2019-03-21 MED ORDER — HYDRALAZINE HCL 50 MG PO TABS
100.0000 mg | ORAL_TABLET | Freq: Three times a day (TID) | ORAL | Status: DC
  Administered 2019-03-21 – 2019-03-23 (×6): 100 mg via ORAL
  Filled 2019-03-21 (×6): qty 2

## 2019-03-21 MED ORDER — METHOCARBAMOL 500 MG PO TABS
500.0000 mg | ORAL_TABLET | Freq: Four times a day (QID) | ORAL | Status: DC | PRN
  Administered 2019-03-22: 500 mg via ORAL
  Filled 2019-03-21: qty 1

## 2019-03-21 MED ORDER — POVIDONE-IODINE 10 % EX SWAB: 2.0000 "application " | Freq: Once | CUTANEOUS | Status: DC

## 2019-03-21 MED ORDER — ACETAMINOPHEN 500 MG PO TABS
1000.0000 mg | ORAL_TABLET | Freq: Once | ORAL | Status: AC
  Administered 2019-03-21: 10:00:00 1000 mg via ORAL

## 2019-03-21 MED ORDER — HYDROCODONE-ACETAMINOPHEN 5-325 MG PO TABS
1.0000 | ORAL_TABLET | ORAL | Status: DC | PRN
  Administered 2019-03-21 – 2019-03-22 (×4): 1 via ORAL
  Filled 2019-03-21 (×4): qty 1

## 2019-03-21 MED ORDER — LACTATED RINGERS IV SOLN
INTRAVENOUS | Status: DC
  Administered 2019-03-21: 17:00:00 via INTRAVENOUS

## 2019-03-21 MED ORDER — PROPOFOL 500 MG/50ML IV EMUL
INTRAVENOUS | Status: AC
  Filled 2019-03-21: qty 50

## 2019-03-21 MED ORDER — HYDROCODONE-ACETAMINOPHEN 7.5-325 MG PO TABS
1.0000 | ORAL_TABLET | ORAL | Status: DC | PRN
  Administered 2019-03-21: 2 via ORAL
  Administered 2019-03-22: 1 via ORAL
  Administered 2019-03-23: 2 via ORAL
  Filled 2019-03-21: qty 1
  Filled 2019-03-21 (×2): qty 2

## 2019-03-21 MED ORDER — FENTANYL CITRATE (PF) 100 MCG/2ML IJ SOLN: 25.0000 ug | INTRAMUSCULAR | Status: DC | PRN

## 2019-03-21 MED ORDER — ROPIVACAINE HCL 5 MG/ML IJ SOLN
INTRAMUSCULAR | Status: AC
  Filled 2019-03-21: qty 30

## 2019-03-21 MED ORDER — CELECOXIB 200 MG PO CAPS
400.0000 mg | ORAL_CAPSULE | Freq: Once | ORAL | Status: AC
  Administered 2019-03-21: 10:00:00 400 mg via ORAL

## 2019-03-21 MED ORDER — ACETAMINOPHEN 325 MG PO TABS: 325.0000 mg | ORAL_TABLET | Freq: Four times a day (QID) | ORAL | Status: DC | PRN

## 2019-03-21 MED ORDER — ROPIVACAINE HCL 5 MG/ML IJ SOLN
INTRAMUSCULAR | Status: DC | PRN
  Administered 2019-03-21: 30 mL via PERINEURAL

## 2019-03-21 MED ORDER — SUCCINYLCHOLINE CHLORIDE 20 MG/ML IJ SOLN
INTRAMUSCULAR | Status: AC
  Filled 2019-03-21: qty 1

## 2019-03-21 MED ORDER — SPIRONOLACTONE 25 MG PO TABS
25.0000 mg | ORAL_TABLET | Freq: Every day | ORAL | Status: DC
  Administered 2019-03-22 – 2019-03-23 (×2): 25 mg via ORAL
  Filled 2019-03-21 (×2): qty 1

## 2019-03-21 MED ORDER — MENTHOL 3 MG MT LOZG
1.0000 | LOZENGE | OROMUCOSAL | Status: DC | PRN
  Filled 2019-03-21: qty 9

## 2019-03-21 MED ORDER — FAMOTIDINE 20 MG PO TABS
10.0000 mg | ORAL_TABLET | Freq: Every day | ORAL | Status: DC
  Administered 2019-03-22 – 2019-03-23 (×2): 10 mg via ORAL
  Filled 2019-03-21 (×2): qty 1

## 2019-03-21 MED ORDER — PROPOFOL 10 MG/ML IV BOLUS
INTRAVENOUS | Status: DC | PRN
  Administered 2019-03-21 (×2): 30 mg via INTRAVENOUS
  Administered 2019-03-21: 60 mg via INTRAVENOUS

## 2019-03-21 MED ORDER — DEXTROSE 5 % IV SOLN
3.0000 g | INTRAVENOUS | Status: AC
  Administered 2019-03-21: 3 g via INTRAVENOUS
  Filled 2019-03-21: qty 3000

## 2019-03-21 MED ORDER — SODIUM CHLORIDE 0.9 % IR SOLN
Status: DC | PRN
  Administered 2019-03-21: 13:00:00 2000 mL

## 2019-03-21 MED ORDER — MAGNESIUM CITRATE PO SOLN
1.0000 | Freq: Once | ORAL | Status: DC | PRN
  Filled 2019-03-21: qty 296

## 2019-03-21 MED ORDER — BISACODYL 10 MG RE SUPP
10.0000 mg | Freq: Every day | RECTAL | Status: DC | PRN
  Administered 2019-03-23: 10 mg via RECTAL
  Filled 2019-03-21: qty 1

## 2019-03-21 MED ORDER — DOCUSATE SODIUM 100 MG PO CAPS
100.0000 mg | ORAL_CAPSULE | Freq: Two times a day (BID) | ORAL | Status: DC
  Administered 2019-03-21 – 2019-03-23 (×4): 100 mg via ORAL
  Filled 2019-03-21 (×4): qty 1

## 2019-03-21 MED ORDER — LIDOCAINE HCL (PF) 1 % IJ SOLN
INTRAMUSCULAR | Status: DC | PRN
  Administered 2019-03-21: 5 mL via SUBCUTANEOUS

## 2019-03-21 MED ORDER — MAGNESIUM HYDROXIDE 400 MG/5ML PO SUSP
30.0000 mL | Freq: Every day | ORAL | Status: DC | PRN
  Administered 2019-03-23: 30 mL via ORAL
  Filled 2019-03-21 (×2): qty 30

## 2019-03-21 MED ORDER — ACETAMINOPHEN 500 MG PO TABS
ORAL_TABLET | ORAL | Status: AC
  Administered 2019-03-21: 1000 mg via ORAL
  Filled 2019-03-21: qty 2

## 2019-03-21 MED ORDER — PANTOPRAZOLE SODIUM 40 MG PO TBEC: 40.0000 mg | DELAYED_RELEASE_TABLET | Freq: Every day | ORAL | Status: DC

## 2019-03-21 MED ORDER — DIPHENHYDRAMINE HCL 12.5 MG/5ML PO ELIX: 12.5000 mg | ORAL_SOLUTION | ORAL | Status: DC | PRN

## 2019-03-21 MED ORDER — BACITRACIN 50000 UNITS IM SOLR
INTRAMUSCULAR | Status: AC
  Filled 2019-03-21: qty 2

## 2019-03-21 MED ORDER — BUPIVACAINE-EPINEPHRINE (PF) 0.5% -1:200000 IJ SOLN
INTRAMUSCULAR | Status: AC
  Filled 2019-03-21: qty 30

## 2019-03-21 MED ORDER — LOSARTAN POTASSIUM 50 MG PO TABS
100.0000 mg | ORAL_TABLET | Freq: Every day | ORAL | Status: DC
  Administered 2019-03-21 – 2019-03-23 (×3): 100 mg via ORAL
  Filled 2019-03-21 (×3): qty 2

## 2019-03-21 MED ORDER — ACETAMINOPHEN 500 MG PO TABS
500.0000 mg | ORAL_TABLET | Freq: Four times a day (QID) | ORAL | Status: AC
  Administered 2019-03-21 – 2019-03-22 (×3): 500 mg via ORAL
  Filled 2019-03-21 (×3): qty 1

## 2019-03-21 MED ORDER — PROPOFOL 10 MG/ML IV BOLUS
INTRAVENOUS | Status: AC
  Filled 2019-03-21: qty 20

## 2019-03-21 MED ORDER — RIVAROXABAN 10 MG PO TABS
10.0000 mg | ORAL_TABLET | Freq: Every day | ORAL | Status: DC
  Filled 2019-03-21: qty 1

## 2019-03-21 MED ORDER — GLYCOPYRROLATE 0.2 MG/ML IJ SOLN
INTRAMUSCULAR | Status: DC | PRN
  Administered 2019-03-21: 0.2 mg via INTRAVENOUS

## 2019-03-21 MED ORDER — CARBAMAZEPINE 200 MG PO TABS
600.0000 mg | ORAL_TABLET | Freq: Every morning | ORAL | Status: DC
  Administered 2019-03-22 – 2019-03-23 (×2): 600 mg via ORAL
  Filled 2019-03-21 (×2): qty 3

## 2019-03-21 MED ORDER — SODIUM CHLORIDE FLUSH 0.9 % IV SOLN
INTRAVENOUS | Status: AC
  Filled 2019-03-21: qty 60

## 2019-03-21 MED ORDER — MIDAZOLAM HCL 2 MG/2ML IJ SOLN
INTRAMUSCULAR | Status: AC
  Administered 2019-03-21: 1 mg via INTRAVENOUS
  Filled 2019-03-21: qty 2

## 2019-03-21 SURGICAL SUPPLY — 62 items
BASEPLATE TIBIAL LT SZ6 (Joint) ×2 IMPLANT
BLADE SAW 18WX90L 1.27 THK (BLADE) ×2 IMPLANT
BLADE SAW SAG 25X90X1.19 (BLADE) ×2 IMPLANT
BOWL CEMENT MIX W/ADAPTER (MISCELLANEOUS) ×2 IMPLANT
BRUSH SCRUB EZ  4% CHG (MISCELLANEOUS) ×2
BRUSH SCRUB EZ 4% CHG (MISCELLANEOUS) ×2 IMPLANT
CANISTER SUCT 1200ML W/VALVE (MISCELLANEOUS) ×2 IMPLANT
CANISTER SUCT 3000ML PPV (MISCELLANEOUS) ×4 IMPLANT
CEMENT BONE 1-PACK (Cement) ×4 IMPLANT
CHLORAPREP W/TINT 26 (MISCELLANEOUS) ×4 IMPLANT
COMP FEMORAL OXINUM SZ7 LEFT (Knees) ×2 IMPLANT
COMP PATELLA GENESIS 35MM (Stem) ×2 IMPLANT
COMPONENT FEMRL OXINUM SZ7 LT (Knees) ×1 IMPLANT
COMPONENT PATELLA GENESIS 35MM (Stem) ×1 IMPLANT
COOLER POLAR GLACIER W/PUMP (MISCELLANEOUS) ×2 IMPLANT
COVER WAND RF STERILE (DRAPES) ×2 IMPLANT
CUFF TOURN SGL QUICK 30 (TOURNIQUET CUFF)
CUFF TOURN SGL QUICK 34 (TOURNIQUET CUFF) ×1
CUFF TRNQT CYL 30X4X21-28X (TOURNIQUET CUFF) IMPLANT
CUFF TRNQT CYL 34X4.125X (TOURNIQUET CUFF) ×1 IMPLANT
DRAPE 3/4 80X56 (DRAPES) ×4 IMPLANT
DRAPE INCISE IOBAN 66X60 STRL (DRAPES) ×2 IMPLANT
ELECT REM PT RETURN 9FT ADLT (ELECTROSURGICAL) ×2
ELECTRODE REM PT RTRN 9FT ADLT (ELECTROSURGICAL) ×1 IMPLANT
GAUZE SPONGE 4X4 12PLY STRL (GAUZE/BANDAGES/DRESSINGS) ×2 IMPLANT
GAUZE XEROFORM 1X8 LF (GAUZE/BANDAGES/DRESSINGS) ×4 IMPLANT
GLOVE BIO SURGEON STRL SZ8 (GLOVE) ×2 IMPLANT
GLOVE BIOGEL PI IND STRL 8.5 (GLOVE) ×1 IMPLANT
GLOVE BIOGEL PI INDICATOR 8.5 (GLOVE) ×1
GLOVE INDICATOR 8.0 STRL GRN (GLOVE) ×2 IMPLANT
GLOVE SURG ORTHO 8.0 STRL STRW (GLOVE) ×6 IMPLANT
GOWN STRL REUS W/ TWL LRG LVL3 (GOWN DISPOSABLE) ×1 IMPLANT
GOWN STRL REUS W/ TWL XL LVL3 (GOWN DISPOSABLE) ×1 IMPLANT
GOWN STRL REUS W/TWL LRG LVL3 (GOWN DISPOSABLE) ×1
GOWN STRL REUS W/TWL XL LVL3 (GOWN DISPOSABLE) ×1
HOOD PEEL AWAY FLYTE STAYCOOL (MISCELLANEOUS) ×8 IMPLANT
INSERT ART HI FLEX 9 SZ5-6 (Insert) ×2 IMPLANT
IV NS 1000ML (IV SOLUTION) ×1
IV NS 1000ML BAXH (IV SOLUTION) ×1 IMPLANT
KIT TURNOVER KIT A (KITS) ×2 IMPLANT
MAT ABSORB  FLUID 56X50 GRAY (MISCELLANEOUS) ×1
MAT ABSORB FLUID 56X50 GRAY (MISCELLANEOUS) ×1 IMPLANT
NDL SAFETY ECLIPSE 18X1.5 (NEEDLE) ×1 IMPLANT
NEEDLE HYPO 18GX1.5 SHARP (NEEDLE) ×1
NEEDLE SPNL 20GX3.5 QUINCKE YW (NEEDLE) ×2 IMPLANT
NS IRRIG 1000ML POUR BTL (IV SOLUTION) ×2 IMPLANT
PACK TOTAL KNEE (MISCELLANEOUS) ×2 IMPLANT
PAD DE MAYO PRESSURE PROTECT (MISCELLANEOUS) ×2 IMPLANT
PAD WRAPON POLAR KNEE (MISCELLANEOUS) ×1 IMPLANT
PULSAVAC PLUS IRRIG FAN TIP (DISPOSABLE) ×2
STAPLER SKIN PROX 35W (STAPLE) ×2 IMPLANT
SUCTION FRAZIER HANDLE 10FR (MISCELLANEOUS) ×1
SUCTION TUBE FRAZIER 10FR DISP (MISCELLANEOUS) ×1 IMPLANT
SUT DVC 2 QUILL PDO  T11 36X36 (SUTURE) ×1
SUT DVC 2 QUILL PDO T11 36X36 (SUTURE) ×1 IMPLANT
SUT VIC AB 2-0 CT1 18 (SUTURE) ×2 IMPLANT
SUT VIC AB 2-0 CT1 27 (SUTURE)
SUT VIC AB 2-0 CT1 TAPERPNT 27 (SUTURE) IMPLANT
SUT VIC AB PLUS 45CM 1-MO-4 (SUTURE) ×2 IMPLANT
SYR 30ML LL (SYRINGE) ×6 IMPLANT
TIP FAN IRRIG PULSAVAC PLUS (DISPOSABLE) ×1 IMPLANT
WRAPON POLAR PAD KNEE (MISCELLANEOUS) ×2

## 2019-03-21 NOTE — Progress Notes (Signed)
PT Cancellation Note  Patient Details Name: Peter [name] MRN: [id number] DOB: June 25, 1961   Cancelled Treatment:    Reason Eval/Treat Not Completed: Patient not medically ready. Pt was not able to volitionally DF ankles. Will hold PT eval at this time and attempt to see pt tomorrow as is medically appropriate.   Jonte Shiller "Gus" Jeannette Corpus, SPT  03/21/19, 4:45 PM

## 2019-03-21 NOTE — Transfer of Care (Signed)
Immediate Anesthesia Transfer of Care Note  Patient: Peter [name]  Procedure(s) Performed: TOTAL KNEE ARTHROPLASTY (Left Knee)  Patient Location: PACU  Anesthesia Type:GA combined with regional for post-op pain  Level of Consciousness: awake, oriented and patient cooperative  Airway & Oxygen Therapy: Patient Spontanous Breathing  Post-op Assessment: Report given to RN and Post -op Vital signs reviewed and stable  Post vital signs: Reviewed and stable  Last Vitals:  Vitals Value Taken Time  BP 96/55 03/21/19 1345  Temp 36.4 C 03/21/19 1345  Pulse 57 03/21/19 1346  Resp 16 03/21/19 1346  SpO2 96 % 03/21/19 1346  Vitals shown include unvalidated device data.  Last Pain:  Vitals:   03/21/19 0930  TempSrc: Tympanic  PainSc: 0-No pain         Complications: No apparent anesthesia complications

## 2019-03-21 NOTE — H&P (Signed)
The patient has been re-examined, and the chart reviewed, and there have been no interval changes to the documented history and physical.  Plan a left total knee replacement today.  Anesthesia is consulted regarding a peripheral nerve block for post-operative pain.  The risks, benefits, and alternatives have been discussed at length, and the patient is willing to proceed.     

## 2019-03-21 NOTE — Op Note (Signed)
DATE OF SURGERY:  03/21/2019 TIME: 1:33 PM  PATIENT NAME:  Peter [name]   AGE: 58 y.o.    PRE-OPERATIVE DIAGNOSIS:  left knee osteoarthritis  POST-OPERATIVE DIAGNOSIS:  Same  PROCEDURE:  Procedure(s): TOTAL KNEE ARTHROPLASTY, left  SURGEON:  Lovell Sheehan, MD   ASSISTANT:  Carlynn Spry, PA-C  OPERATIVE IMPLANTS: Tamala [employer]hew, Cruciate Retaining Oxinium Femoral component size  7, Fixed Bearing Tray size 6, Patella polyethylene 3-peg oval button size 35 mm, with a 9 mm HighFlex insert.   PREOPERATIVE INDICATIONS:  Peter [name] is an 58 y.o. male who has a diagnosis of left knee osteoarthritis and elected for a left total knee arthroplasty after failing nonoperative treatment, including activity modification, pain medication, physical therapy and injections who has significant impairment of their activities of daily living.  Radiographs have demonstrated tricompartmental osteoarthritis joint space narrowing, osteophytes, subchondral sclerosis and cyst formation.  The risks, benefits, and alternatives were discussed at length including but not limited to the risks of infection, bleeding, nerve or blood vessel injury, knee stiffness, fracture, dislocation, loosening or failure of the hardware and the need for further surgery. Medical risks include but not limited to DVT and pulmonary embolism, myocardial infarction, stroke, pneumonia, respiratory failure and death. I discussed these risks with the patient in my office prior to the date of surgery. They understood these risks and were willing to proceed.  OPERATIVE FINDINGS AND UNIQUE ASPECTS OF THE CASE:  All three compartments with advanced and severe degenerative changes, large osteophytes and an abundance of synovial fluid. Significant deformity was also noted. A decision was made to proceed with total knee arthroplasty.   OPERATIVE DESCRIPTION:  The patient was brought to the operative room and placed in a supine position  after undergoing placement of a general anesthetic. IV antibiotics were given. Patient received tranexamic acid. The lower extremity was prepped and draped in the usual sterile fashion.  A time out was performed to verify the patient's name, date of birth, medical record number, correct site of surgery and correct procedure to be performed. The timeout was also used to confirm the patient received antibiotics and that appropriate instruments, implants and radiographs studies were available in the room.  The leg was elevated and exsanguinated with an Esmarch and the tourniquet was inflated to 300 mmHg.  A midline incision was made over the left knee.. A medial parapatellar arthrotomy was then made and the patella subluxed laterally and the knee was brought into 90 of flexion. Hoffa's fat pad along with the anterior cruciate ligament was resected and the medial joint line was exposed.  Attention was then turned to preparation of the patella. The thickness of the patella was measured with a caliper, the diameter measured with the patella templates.  The patella resection was then made with an oscillating saw using the patella cutting guide.  The 35 mm button fit appropriately.  3 peg holes for the patella component were then drilled.  The extramedullary tibial cutting guide was then placed using the anterior tibial crest and second ray of the foot as a reference.  The tibial cutting guide was adjusted to allow for appropriate posterior slope.  The tibial cutting block was pinned into position. The slotted stylus was used to measure the proximal tibial resection of 9 mm off the high lateral side. Care was taken during the tibial resection to protect the medial and collateral ligaments.  The resected tibial bone was removed.  The distal femur was resected  using the Visionaire cutting guide.  Care was taken to protect the collateral ligaments during distal femoral resection.  The distal femoral resection was  performed with an oscillating saw. The femoral cutting guide was then removed. Extension gap was measured with a 9 mm spacer block and alignment and extension was confirmed using a long alignment rod. The femur was sized to be a 7. Rotation of the referencing guide was checked with the epicondylar axis and Whitesides line. Then the 4-in-1 cutting jig was then applied to the distal femur. A stylus was used to confirm that the anterior femur would not be notched.   Then the anterior, posterior and chamfer femoral cuts were then made with an oscillating saw.  The knee was distracted and all posterior osteophytes were removed.  The flexion gap was then measured with a flexion spacer block and long alignment rod and was found to be symmetric with the extension gap and perpendicular to mechanical axis of the tibia.  The proximal tibia plateau was then sized with trial trays. The best coverage was achieved with a size 6. This tibial tray was then pinned into position. The proximal tibia was then prepared with the keel punch.  After tibial preparation was completed, all trial components were inserted with polyethylene trials. The knee achieved full extension and flexed to 120 degrees. Ligament were stable to varus and valgus at full extension as well as 30, 60 and 90 degrees of flexion.   The trials were then placed. Knee was taken through a full range of motion and deemed to be stable with the trial components. All trial components were then removed.  The joint was copiously irrigated with pulse lavage.  The final total knee arthroplasty components were then cemented into place. The knee was held in extension while cement was allowed to cure.The knee was taken through a range of motion and the patella tracked well and the knee was again irrigated copiously.  The knee capsule was then injected with Exparel.  The medial arthrotomy was closed with #1 Vicryl and #2 Quill. The subcutaneous tissue closed with  2-0  vicryl, and skin approximated with staples.  A dry sterile and compressive dressing was applied.  A Polar Care was applied to the operative knee.  The patient was awakened and brought to the PACU in stable and satisfactory condition.  All sharp, lap and instrument counts were correct at the conclusion the case. I spoke with the patient's family in the postop consultation room to let them know the case had been performed without complication and the patient was stable in recovery room.   Total tourniquet time was 50 minutes.

## 2019-03-21 NOTE — Anesthesia Preprocedure Evaluation (Addendum)
Anesthesia Evaluation  Patient identified by MRN, date of birth, ID band Patient awake    Reviewed: Allergy & Precautions, H&P , NPO status , Patient's Chart, lab work & pertinent test results, reviewed documented beta blocker date and time   History of Anesthesia Complications (+) DIFFICULT AIRWAY and history of anesthetic complications  Airway Mallampati: IV  TM Distance: >3 FB Neck ROM: full    Dental  (+) Teeth Intact   Pulmonary neg shortness of breath, sleep apnea and Continuous Positive Airway Pressure Ventilation , neg COPD, neg recent URI,    Pulmonary exam normal breath sounds clear to auscultation- rhonchi (-) wheezing      Cardiovascular hypertension, Pt. on medications (-) angina(-) CAD, (-) Past MI and (-) Cardiac Stents Normal cardiovascular exam(-) dysrhythmias (-) Valvular Problems/Murmurs Rhythm:regular Rate:Normal - Systolic murmurs and - Diastolic murmurs    Neuro/Psych PSYCHIATRIC DISORDERS Depression Bipolar Disorder negative neurological ROS     GI/Hepatic Neg liver ROS, GERD  ,  Endo/Other  neg diabetesMorbid obesity  Renal/GU negative Renal ROS  negative genitourinary   Musculoskeletal   Abdominal (+) + obese,   Peds negative pediatric ROS (+)  Hematology negative hematology ROS (+)   Anesthesia Other Findings Past Medical History: No date: Blood in stool No date: Difficult intubation No date: Glaucoma     Comment: since 2004 No date: Hypertension     Comment: since 2000 No date: Obesity, unspecified 2011: Ulcer     Comment: gastric No date: Unspecified hemorrhoids without mention of com* Past Surgical History: [id number]: APPLICATION OF WOUND VAC N/A     Comment: Procedure: APPLICATION OF WOUND VAC-ABDOMINAL;              Surgeon: Florene Glen, MD;  Location: [facility]              ORS;  Service: General;  Laterality: N/A; 06/23/2016: CARDIAC CATHETERIZATION Left     Comment:  Procedure: Left Heart Cath and Coronary               Angiography;  Surgeon: Nelva Bush, MD;                Location: [facility];  Service:               Cardiovascular;  Laterality: Left; 1992: CHOLECYSTECTOMY 05/22/2010: COLONOSCOPY W/ POLYPECTOMY     Comment: 70mmtransverse colon polyp, traditional               serrated adenoma,negative for high grade               dysplasia & malignancy. rectal               polyp-46mm,negative for dysplasia & malignancy. 2010: KNEE SURGERY Left 10/30/2016: LAPAROSCOPIC APPENDECTOMY N/A     Comment: Procedure: APPENDECTOMY LAPAROSCOPIC changed                to open application of wound vac;  Surgeon:               [name], MD;  Location: [facility] ORS;                Service: General;  Laterality: N/A; 11/11/2016: LAPAROTOMY N/A     Comment: Procedure: EXPLORATORY LAPAROTOMY, DEBRIDEMENT              OF ABDOMINAL WOUND, ABDOMINAL [place] OUT;                Surgeon: Caroleen Hamman  F, MD;  Location: [facility]               ORS;  Service: General;  Laterality: N/A; 11/12/2016: LAPAROTOMY N/A     Comment: Procedure: EXPLORATORY LAPAROTOMY;  Surgeon:               Clayburn Pert, MD;  Location: [facility] ORS;                Service: General;  Laterality: N/A; 11/14/2016: LAPAROTOMY N/A     Comment: Procedure: EXPLORATORY LAPAROTOMY; Irrigation,              partial closure;  Surgeon: Clayburn Pert,               MD;  Location: [facility] ORS;  Service: General;                Laterality: N/A; BMI    Body Mass Index:  41.91 kg/m     Reproductive/Obstetrics negative OB ROS                             Anesthesia Physical  Anesthesia Plan  ASA: III  Anesthesia Plan: Spinal   Post-op Pain Management:  Regional for Post-op pain   Induction: Intravenous  PONV Risk Score and Plan: 2 and Propofol infusion and TIVA  Airway Management Planned: Natural Airway and Simple Face Mask  Additional Equipment:   Intra-op Plan:    Post-operative Plan: Post-operative intubation/ventilation  Informed Consent: I have reviewed the patients History and Physical, chart, labs and discussed the procedure including the risks, benefits and alternatives for the proposed anesthesia with the patient or authorized representative who has indicated his/her understanding and acceptance.     Dental Advisory Given and Consent reviewed with POA  Plan Discussed with: CRNA and Anesthesiologist  Anesthesia Plan Comments:       Anesthesia Quick Evaluation

## 2019-03-21 NOTE — Anesthesia Procedure Notes (Signed)
Spinal  Patient location during procedure: OR Start time: 03/21/2019 11:45 AM End time: 03/21/2019 11:52 AM Staffing Anesthesiologist: , , MD Performed: anesthesiologist  Preanesthetic Checklist Completed: patient identified, site marked, surgical consent, pre-op evaluation, timeout performed, IV checked, risks and benefits discussed and monitors and equipment checked Spinal Block Patient position: sitting Prep: ChloraPrep Patient monitoring: heart rate, continuous pulse ox, blood pressure and cardiac monitor Approach: midline Location: L4-5 Injection technique: single-shot Needle Needle type: Introducer and Pencil-Tip  Needle gauge: 24 G Needle length: 9 cm Additional Notes Negative paresthesia. Negative blood return. Positive free-flowing CSF. Expiration date of kit checked and confirmed. Patient tolerated procedure well, without complications.       

## 2019-03-21 NOTE — Anesthesia Procedure Notes (Signed)
Anesthesia Regional Block: Adductor canal block   Pre-Anesthetic Checklist: ,, timeout performed, Correct Patient, Correct Site, Correct Laterality, Correct Procedure, Correct Position, site marked, Risks and benefits discussed,  Surgical consent,  Pre-op evaluation,  At surgeon's request and post-op pain management  Laterality: Lower and Left  Prep: chloraprep       Needles:  Injection technique: Single-shot  Needle Type: Echogenic Needle     Needle Length: 9cm  Needle Gauge: 22     Additional Needles:   Procedures:,,,, ultrasound used (permanent image in chart),,,,  Narrative:  Start time: 03/21/2019 10:40 AM End time: 03/21/2019 10:45 AM Injection made incrementally with aspirations every 5 mL.  Performed by: Personally  Anesthesiologist: Martha Clan, MD  Additional Notes: Patient consented for risk and benefits of nerve block including but not limited to nerve damage, failed block, bleeding and infection.  Patient voiced understanding.  Functioning IV was confirmed and monitors were applied.  A echogenic needle was used. Sterile prep,hand hygiene and sterile gloves were used. Minimal sedation used for procedure.   No paresthesia endorsed by patient during the procedure.  Negative aspiration and negative test dose prior to incremental administration of local anesthetic. The patient tolerated the procedure well with no immediate complications.

## 2019-03-21 NOTE — Progress Notes (Signed)
Bladder scanned for 132cc

## 2019-03-21 NOTE — Progress Notes (Signed)
I checked the patient's home CPAP machine and there are no frayed electrical wires. It seems to be in great working condition. I called into clinical engineering and placed an order for them to check it at [place].

## 2019-03-21 NOTE — Anesthesia Post-op Follow-up Note (Signed)
Anesthesia QCDR form completed.        

## 2019-03-22 LAB — CBC
HCT: 37.5 % — ABNORMAL LOW (ref 39.0–52.0)
Hemoglobin: 12.4 g/dL — ABNORMAL LOW (ref 13.0–17.0)
MCH: 29.6 pg (ref 26.0–34.0)
MCHC: 33.1 g/dL (ref 30.0–36.0)
MCV: 89.5 fL (ref 80.0–100.0)
Platelets: 153 10*3/uL (ref 150–400)
RBC: 4.19 MIL/uL — ABNORMAL LOW (ref 4.22–5.81)
RDW: 12.6 % (ref 11.5–15.5)
WBC: 7.3 10*3/uL (ref 4.0–10.5)
nRBC: 0 % (ref 0.0–0.2)

## 2019-03-22 LAB — BASIC METABOLIC PANEL
Anion gap: 6 (ref 5–15)
BUN: 27 mg/dL — ABNORMAL HIGH (ref 6–20)
CO2: 25 mmol/L (ref 22–32)
Calcium: 8.1 mg/dL — ABNORMAL LOW (ref 8.9–10.3)
Chloride: 108 mmol/L (ref 98–111)
Creatinine, Ser: 0.77 mg/dL (ref 0.61–1.24)
GFR calc Af Amer: >60 mL/min (ref >60)
GFR calc non Af Amer: >60 mL/min (ref >60)
Glucose, Bld: 103 mg/dL — ABNORMAL HIGH (ref 70–99)
Potassium: 3.7 mmol/L (ref 3.5–5.1)
Sodium: 139 mmol/L (ref 135–145)

## 2019-03-22 MED ORDER — ENOXAPARIN SODIUM 40 MG/0.4ML ~~LOC~~ SOLN
40.0000 mg | Freq: Every day | SUBCUTANEOUS | Status: DC
  Administered 2019-03-22: 40 mg via SUBCUTANEOUS
  Filled 2019-03-22: qty 0.4

## 2019-03-22 MED ORDER — ENOXAPARIN SODIUM 40 MG/0.4ML ~~LOC~~ SOLN
40.0000 mg | Freq: Two times a day (BID) | SUBCUTANEOUS | Status: DC
  Administered 2019-03-22 – 2019-03-23 (×2): 40 mg via SUBCUTANEOUS
  Filled 2019-03-22 (×2): qty 0.4

## 2019-03-22 NOTE — Progress Notes (Signed)
Physical Therapy Treatment Patient Details Name: Peter [name] MRN: [id number] DOB: Feb 13, 1961 Today's Date: 03/22/2019    History of Present Illness Per MD: Pt is an 58 y.o. male who has a diagnosis of left knee osteoarthritis and elected for a left total knee arthroplasty after failing nonoperative treatment, including activity modification, pain medication, physical therapy and injections who has significant impairment of their activities of daily living.  Radiographs have demonstrated tricompartmental osteoarthritis joint space narrowing, osteophytes, subchondral sclerosis and cyst formation. PMH includes: Laparotomy, HTN, bipolar disorder, GERD, obesity.    PT Comments    Pt presented with deficits in L knee ROM, strength, transfers, gait, balance, and activity tolerance. Pt had N/V all afternoon but was willing to participate to tolerance this session. Complained of a 6/10 HA, and that his bottom was painful/asleep at beginning of session. Upon standing, the pt felt the need to void, and was able to ambulate and stand with min A while the elevated commode was put in place. Toileting was a moderate success, and pt tolerated ambulating back to the chair, changing gown, transferring back to bed, and safely mobilizing to a comfortable position without completely fatiguing. Functional mobility remains limited and PT is closely observing progress and will make final recommendation regarding discharge planning based on next session. Pt will benefit from HHPT upon discharge to safely address above deficits for decreased caregiver assistance and eventual return to PLOF.   Follow Up Recommendations  Home health PT     Equipment Recommendations  3in1 (PT)    Recommendations for Other Services       Precautions / Restrictions Precautions Precautions: Fall Restrictions Weight Bearing Restrictions: Yes LLE Weight Bearing: Weight bearing as tolerated    Mobility  Bed Mobility Overal bed  mobility: Modified Independent                Transfers Overall transfer level: Needs assistance Equipment used: Rolling walker (2 wheeled) Transfers: Sit to/from Stand Sit to Stand: Min assist         General transfer comment: Cuing required for safety and efficiency, with good carryover  Ambulation/Gait Ambulation/Gait assistance: Min assist Gait Distance (Feet): 40 Feet Assistive device: Rolling walker (2 wheeled) Gait Pattern/deviations: Step-through pattern;Decreased step length - right;Decreased step length - left;Antalgic Gait velocity: Decreased   General Gait Details: At rest pt did not want to ambulate, but upon standing needed to relieve himself, and was able to complete the trip to the bathroom and get back into bed, with a fresh gown and some linens changed. Steps were still tentative due to pain.   Stairs             Wheelchair Mobility    Modified Rankin (Stroke Patients Only)       Balance Overall balance assessment: Mild deficits observed, not formally tested                                          Cognition Arousal/Alertness: Awake/alert Behavior During Therapy: WFL for tasks assessed/performed Overall Cognitive Status: Within Functional Limits for tasks assessed                                        Exercises Total Joint Exercises Ankle Circles/Pumps: AROM;Strengthening;Both;5 reps;10 reps Quad Sets: AROM;Both;5 reps;10 reps Gluteal Sets:  AROM;Both;5 reps;10 reps Long Arc Quad: AROM;Strengthening;Both;5 reps;10 reps Knee Flexion: AROM;Both;5 reps;10 reps Marching in Standing: AROM;Both;5 reps    General Comments        Pertinent Vitals/Pain Pain Assessment: 0-10 Pain Score: 6  Pain Location: Pain in knee was a 2 at rest, 5 by end of session; HA was 6 at rest, 3 by end of session. Pain Descriptors / Indicators: Aching;Sore Pain Intervention(s): Limited activity within patient's  tolerance;Monitored during session    Home Living                      Prior Function            PT Goals (current goals can now be found in the care plan section) Progress towards PT goals: Progressing toward goals    Frequency    BID      PT Plan Current plan remains appropriate    Co-evaluation              AM-PAC PT "6 Clicks" Mobility   Outcome Measure  Help needed turning from your back to your side while in a flat bed without using bedrails?: None Help needed moving from lying on your back to sitting on the side of a flat bed without using bedrails?: A Little Help needed moving to and from a bed to a chair (including a wheelchair)?: A Little Help needed standing up from a chair using your arms (e.g., wheelchair or bedside chair)?: A Little Help needed to walk in hospital room?: A Little Help needed climbing 3-5 steps with a railing? : A Lot 6 Click Score: 18    End of Session Equipment Utilized During Treatment: Gait belt Activity Tolerance: Patient limited by pain;Patient limited by fatigue Patient left: in bed;with call bell/phone within reach;with bed alarm set;with SCD's reapplied;Other (comment)(L Knee Polar Care donned) Nurse Communication: Mobility status PT Visit Diagnosis: Unsteadiness on feet (R26.81);Other abnormalities of gait and mobility (R26.89);Muscle weakness (generalized) (M62.81)     Time: [id number] PT Time Calculation (min) (ACUTE ONLY): 36 min  Charges:                        [name], SPT  03/22/19, 5:36 PM

## 2019-03-22 NOTE — Progress Notes (Signed)
PT Cancellation Note  Patient Details Name: ALYN LOKEN MRN: [id number] DOB: 07/03/1961   Cancelled Treatment:    Reason Eval/Treat Not Completed: Patient declined PT session secondary to recent vomiting, generally not feeling well, nursing aware. Will attempt to see pt at future date/time as appropriate.   Gus Frenceschi 03/22/2019, 2:12 PM

## 2019-03-22 NOTE — Progress Notes (Signed)
  Subjective:  Patient reports pain as moderate.  Has a headache this morning.  Objective:   VITALS:   Vitals:   03/22/19 0459 03/22/19 0758 03/22/19 0815 03/22/19 1142  BP: 128/61 (!) 151/73 129/69 117/70  Pulse: (!) 57 64 66 63  Resp: 18 20 19 18   Temp: 98.3 F (36.8 C) 99.1 F (37.3 C) 98.2 F (36.8 C) 98.5 F (36.9 C)  TempSrc:  Oral Oral Oral  SpO2: 95% 95% 94% 94%  Weight:      Height:        PHYSICAL EXAM:  ABD soft Sensation intact distally Dorsiflexion/Plantar flexion intact Incision: dressing C/D/I Compartment soft  LABS  Results for orders placed or performed during the hospital encounter of 03/21/19 (from the past 24 hour(s))  CBC     Status: Abnormal   Collection Time: 03/22/19  5:31 AM  Result Value Ref Range   WBC 7.3 4.0 - 10.5 K/uL   RBC 4.19 (L) 4.22 - 5.81 MIL/uL   Hemoglobin 12.4 (L) 13.0 - 17.0 g/dL   HCT 37.5 (L) 39.0 - 52.0 %   MCV 89.5 80.0 - 100.0 fL   MCH 29.6 26.0 - 34.0 pg   MCHC 33.1 30.0 - 36.0 g/dL   RDW 12.6 11.5 - 15.5 %   Platelets 153 150 - 400 K/uL   nRBC 0.0 0.0 - 0.2 %  Basic metabolic panel     Status: Abnormal   Collection Time: 03/22/19  5:31 AM  Result Value Ref Range   Sodium 139 135 - 145 mmol/L   Potassium 3.7 3.5 - 5.1 mmol/L   Chloride 108 98 - 111 mmol/L   CO2 25 22 - 32 mmol/L   Glucose, Bld 103 (H) 70 - 99 mg/dL   BUN 27 (H) 6 - 20 mg/dL   Creatinine, Ser 0.77 0.61 - 1.24 mg/dL   Calcium 8.1 (L) 8.9 - 10.3 mg/dL   GFR calc non Af Amer >60 >60 mL/min   GFR calc Af Amer >60 >60 mL/min   Anion gap 6 5 - 15    Dg Knee Left Port  Result Date: 03/21/2019 CLINICAL DATA:  Left knee osteoarthritis. Status post left total knee replacement. EXAM: PORTABLE LEFT KNEE - 1-2 VIEW COMPARISON:  None. FINDINGS: AP and lateral views demonstrate that the components of the total knee prosthesis appear in excellent position. No fractures. Expected postsurgical fluid and air. IMPRESSION: Satisfactory appearance of the left  knee after total knee replacement. Electronically Signed   By: Lorriane Shire M.D.   On: 03/21/2019 14:31   [place] Or Nerve Block-image Only ([facility])  Result Date: 03/21/2019 There is no interpretation for this exam.  This order is for images obtained during a surgical procedure.  Please See "Surgeries" Tab for more information regarding the procedure.    Assessment/Plan: 1 Day Post-Op   Active Problems:   S/P TKR (total knee replacement) using cement, left   Advance diet Up with therapy D/C IV fluids Plan for discharge tomorrow if cleared by PT Lovenox for DVT prophylaxis, unable to take Xarelto Dressing change on Friday   Lovell Sheehan , MD 03/22/2019, 12:49 PM

## 2019-03-22 NOTE — TOC Initial Note (Addendum)
Transition of Care [facility]) - Initial/Assessment Note    Patient Details  Name: Peter [name] MRN: [id number] Date of Birth: [birth year]  Transition of Care [facility]) CM/SW Contact:    Su Hilt, RN Phone Number: 03/22/2019, 3:15 PM  Clinical Narrative:                 Met with the patient in the room to discuss DC plan and needs He lives at home with his wife and she provides transportation He has a single point cane, RW, and grab bars He will benefit from a 3 in 1 I notified Brad with [employer] He needs HH PT, [facility] is unable to accept due to not being INN with Christella Scheuermann, I reached out to [place] with [facility] to see if they can accept the patient, awaiting a response Will continue to monitor for additional needs  Expected Discharge Plan: [place] Barriers to Discharge: Continued Medical Work up   Patient Goals and CMS Choice Patient states their goals for this hospitalization and ongoing recovery are:: go home CMS Medicare.gov Compare Post Acute Care list provided to:: Patient Choice offered to / list presented to : Patient  Expected Discharge Plan and Services Expected Discharge Plan: [place]   Discharge Planning Services: CM Consult Post Acute Care Choice: [place] arrangements for the past 2 months: Single Family Home                 DME Arranged: 3-N-1 DME Agency: [employer] Date DME Agency Contacted: 03/22/19 Time DME Agency Contacted: [phone] Representative spoke with at DME Agency: [place]: PT [place]: [facility] [employer] Date [place]: 03/22/19 Time [place]: 10 Representative spoke with at [place]: Corene Cornea  Prior Living Arrangements/Services Living arrangements for the past 2 months: [place] Lives with:: Spouse Patient language and need for interpreter reviewed:: Yes Do you feel safe going back to the place where you live?: Yes      Need for  Family Participation in Patient Care: No (Comment) Care giver support system in place?: Yes (comment) Current home services: DME(RW, S[place]gle point cane, Grab BArs) Criminal Activity/Legal Involvement Pertinent to Current Situation/Hospitalization: No - Comment as needed  Activities of Daily Living Home Assistive Devices/Equipment: CPAP, Cane (specify quad or straight), Walker (specify type) ADL Screening (condition at time of admission) Patient's cognitive ability adequate to safely complete daily activities?: Yes Is the patient deaf or have difficulty hearing?: No Does the patient have difficulty seeing, even when wearing glasses/contacts?: No Does the patient have difficulty concentrating, remembering, or making decisions?: No Patient able to express need for assistance with ADLs?: Yes Does the patient have difficulty dressing or bathing?: No Independently performs ADLs?: Yes (appropriate for developmental age) Walks in Home: Independent with device (comment)(cane) Does the patient have difficulty walking or climbing stairs?: Yes Weakness of Legs: Left(surgery) Weakness of Arms/Hands: None  Permission Sought/Granted   Permission granted to share information with : Yes, Verbal Permission Granted              Emotional Assessment Appearance:: Appears stated age Attitude/Demeanor/Rapport: Engaged Affect (typically observed): Appropriate, Calm Orientation: : Oriented to Self, Oriented to Place, Oriented to  Time, Oriented to Situation Alcohol / Substance Use: Not Applicable Psych Involvement: No (comment)  Admission diagnosis:  M17.11 unilatreal primary osteoarthritis right knee Patient Active Problem List   Diagnosis Date Noted  . S/P TKR (total knee  replacement) using cement, left 03/21/2019  . GERD (gastroesophageal reflux disease) 12/16/2018  . Erectile dysfunction 09/14/2017  . Open abdominal incision with drainage   . 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 Necrotizing soft  tissue infection 11/11/2016  . Decreased libido 09/21/2016  . Shortness of breath 06/15/2016  . Abnormal stress test 06/15/2016  . 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 Avitaminosis D 03/25/2015  . Blood in stool   . Clinical depression 06/21/2011  . Cannot sleep 08/22/2009  . Hypercholesterolemia without hypertriglyceridemia 01/28/2009  . Hay fever 10/22/2008  . Adjustment disorder with mixed anxiety and depressed mood 12/12/2007  . Essential (primary) hypertension 12/12/2007  . Apnea, sleep 12/12/2007   PCP:  Jerrol Banana., MD Pharmacy:   [facility] [phone], [place] AT [place] [address] Phone: [phone] Fax: [phone]  [facility] [place], [place] [address] [place] [address] Phone: [phone] Fax: [phone]     Social Determinants of Health (SDOH) Interventions    Readmission Risk Interventions No flowsheet data found.

## 2019-03-22 NOTE — TOC Progression Note (Signed)
Transition of Care [facility]) - Progression Note    Patient Details  Name: Peter [name] MRN: [id number] Date of Birth: 1960-09-30  Transition of Care [facility]) CM/SW Contact  Su Hilt, RN Phone Number: 03/22/2019, 3:23 PM  Clinical Narrative:    [facility] notified me that [facility] will [facility] the patient, I let her know he plans to DC tomorrow   Expected Discharge Plan: [place] Barriers to Discharge: Continued Medical Work up  Expected Discharge Plan and Services Expected Discharge Plan: [place]   Discharge Planning Services: CM Consult Post Acute Care Choice: [place] arrangements for the past 2 months: Single Family Home                 DME Arranged: 3-N-1 DME Agency: [employer] Date DME Agency Contacted: 03/22/19 Time DME Agency Contacted: [phone] Representative spoke with at DME Agency: [place]: PT [place]: [facility] [employer] Date [place]: 03/22/19 Time [place]: 1515 Representative spoke with at [place]: [place] ([place]) Interventions    Readmission Risk Interventions No flowsheet data found.

## 2019-03-22 NOTE — Anesthesia Postprocedure Evaluation (Signed)
Anesthesia Post Note  Patient: Peter [name]  Procedure(s) Performed: TOTAL KNEE ARTHROPLASTY (Left Knee)  Patient location during evaluation: Nursing Unit Anesthesia Type: Spinal Level of consciousness: awake, awake and alert and oriented Pain management: pain level controlled Vital Signs Assessment: post-procedure vital signs reviewed and stable Respiratory status: spontaneous breathing, nonlabored ventilation and respiratory function stable Cardiovascular status: blood pressure returned to baseline and stable Postop Assessment: no headache and no backache Anesthetic complications: no     Last Vitals:  Vitals:   03/21/19 2331 03/22/19 0459  BP: 134/73 128/61  Pulse: 73 (!) 57  Resp: 18 18  Temp: 36.8 C 36.8 C  SpO2: 96% 95%    Last Pain:  Vitals:   03/22/19 0243  TempSrc:   PainSc: 7                  [employer]

## 2019-03-22 NOTE — Evaluation (Signed)
Physical Therapy Evaluation Patient Details Name: Peter [name] MRN: [id number] DOB: [birth year] Today's Date: 03/22/2019   History of Present Illness  Per MD: Pt is an 58 y.o. male who has a diagnosis of left knee osteoarthritis and elected for a left total knee arthroplasty after failing nonoperative treatment, including activity modification, pain medication, physical therapy and injections who has significant impairment of their activities of daily living.  Radiographs have demonstrated tricompartmental osteoarthritis joint space narrowing, osteophytes, subchondral sclerosis and cyst formation. PMH includes: Laparotomy, HTN, bipolar disorder, GERD, obesity.  Clinical Impression  Pt presented with deficits in ROM, strength, transfers, gait, balance, and activity tolerance. Pt received pain medication right as session began, and was somewhat limited by pain throughout including some shooting knee pain while completing seated knee flexion, and by the end of session was feeling less pain in his knee and much less in his back. Pt responded well to education and cuing on how to move his knee during transfers. Pt reported feeling a little lightheaded upon sitting up, BP was taken at this time, 150/76. Pt ambulated from EOB to chair and had hit his max tolerance for activity. Pt had a little bit of nausea after walking to and sitting down in the recliner, resting and taking sips of juice seemed to help alleviate symptoms. Pt will benefit from HHPT services upon discharge to safely address above deficits for decreased caregiver assistance and eventual return to PLOF.     Follow Up Recommendations Home health PT    Equipment Recommendations  3in1 (PT)    Recommendations for Other Services       Precautions / Restrictions Precautions Precautions: Fall Restrictions Weight Bearing Restrictions: Yes LLE Weight Bearing: Weight bearing as tolerated      Mobility  Bed Mobility Overal bed mobility:  Modified Independent                Transfers Overall transfer level: Needs assistance Equipment used: Rolling walker (2 wheeled) Transfers: Sit to/from Stand Sit to Stand: Min assist         General transfer comment: Cuing required for safety and efficiency.  Ambulation/Gait Ambulation/Gait assistance: Min assist Gait Distance (Feet): 6 Feet Assistive device: Rolling walker (2 wheeled) Gait Pattern/deviations: Step-through pattern;Decreased step length - right;Decreased step length - left;Antalgic Gait velocity: Decreased   General Gait Details: Steps were small and tentative due to pain and some unsteadiness  Stairs            Wheelchair Mobility    Modified Rankin (Stroke Patients Only)       Balance Overall balance assessment: Mild deficits observed, not formally tested                                           Pertinent Vitals/Pain Pain Assessment: 0-10 Pain Score: 6  Pain Location: Pain in knee was a 2-3, and pain in the back was a 6 Pain Descriptors / Indicators: Aching;Sore;Shooting;Tender Pain Intervention(s): Limited activity within patient's tolerance;Monitored during session;Premedicated before session;Repositioned    Home Living Family/patient expects to be discharged to:: Private residence Living Arrangements: Spouse/significant other Available Help at Discharge: Family;Available PRN/intermittently Type of Home: House Home Access: Stairs to enter Entrance Stairs-Rails: Right Entrance Stairs-Number of Steps: 3 Home Layout: One level Home Equipment: Grab bars - tub/shower;Walker - 2 wheels;Cane - single point      Prior Function Level of  Independence: Independent         Comments: Pt was Ind with home ambulation and ADLs without and AD, Ind with community ambulation with a SPC. 2 falls within 3 weeks: one stepping out of the tub/shower and missing the rug     Hand Dominance   Dominant Hand: Right     Extremity/Trunk Assessment   Upper Extremity Assessment Upper Extremity Assessment: Overall WFL for tasks assessed    Lower Extremity Assessment Lower Extremity Assessment: Generalized weakness       Communication   Communication: No difficulties  Cognition Arousal/Alertness: Awake/alert Behavior During Therapy: WFL for tasks assessed/performed Overall Cognitive Status: Within Functional Limits for tasks assessed                                        General Comments      Exercises Total Joint Exercises Ankle Circles/Pumps: AROM;Strengthening;Both;10 reps;15 reps Quad Sets: AAROM;Both;10 reps;15 reps Short Arc Quad: AROM;Strengthening;Both;10 reps Heel Slides: AAROM;Strengthening;Both;10 reps Hip ABduction/ADduction: AAROM;Strengthening;Both;10 reps;15 reps Straight Leg Raises: AROM;Strengthening;Both;10 reps Knee Flexion: AROM;Both;10 reps Goniometric ROM: L Knee AROM:13-70 degrees Marching in Standing: AROM Standing Hip Extension: AROM;Strengthening;Both;5 reps;10 reps Other Exercises Other Exercises: Pt education on HEP as outlined in handout   Assessment/Plan    PT Assessment Patient needs continued PT services  PT Problem List Decreased strength;Decreased range of motion;Decreased activity tolerance;Decreased balance;Decreased coordination       PT Treatment Interventions Gait training;Stair training;Functional mobility training;Therapeutic activities;Therapeutic exercise;Balance training;Patient/family education    PT Goals (Current goals can be found in the Care Plan section)  Acute Rehab PT Goals Patient Stated Goal: Get moving again, and be able to take his dog for walks PT Goal Formulation: With patient Time For Goal Achievement: 04/04/19 Potential to Achieve Goals: Good    Frequency BID   Barriers to discharge        Co-evaluation               AM-PAC PT "6 Clicks" Mobility  Outcome Measure Help needed turning from your  back to your side while in a flat bed without using bedrails?: None Help needed moving from lying on your back to sitting on the side of a flat bed without using bedrails?: A Little Help needed moving to and from a bed to a chair (including a wheelchair)?: A Lot Help needed standing up from a chair using your arms (e.g., wheelchair or bedside chair)?: A Little Help needed to walk in hospital room?: A Lot Help needed climbing 3-5 steps with a railing? : A Lot 6 Click Score: 16    End of Session Equipment Utilized During Treatment: Gait belt Activity Tolerance: Patient limited by pain;Patient limited by fatigue Patient left: in chair;with call bell/phone within reach;with chair alarm set;with SCD's reapplied;Other (comment)(Donned Polar Care for LLE) Nurse Communication: Mobility status PT Visit Diagnosis: Unsteadiness on feet (R26.81);Other abnormalities of gait and mobility (R26.89);Muscle weakness (generalized) (M62.81)    Time: [id number] PT Time Calculation (min) (ACUTE ONLY): 51 min   Charges:              [name], SPT  03/22/19, 1:34 PM

## 2019-03-23 LAB — CBC
HCT: 36.1 % — ABNORMAL LOW (ref 39.0–52.0)
Hemoglobin: 12.2 g/dL — ABNORMAL LOW (ref 13.0–17.0)
MCH: 29.8 pg (ref 26.0–34.0)
MCHC: 33.8 g/dL (ref 30.0–36.0)
MCV: 88 fL (ref 80.0–100.0)
Platelets: 146 10*3/uL — ABNORMAL LOW (ref 150–400)
RBC: 4.1 MIL/uL — ABNORMAL LOW (ref 4.22–5.81)
RDW: 12.5 % (ref 11.5–15.5)
WBC: 8.4 10*3/uL (ref 4.0–10.5)
nRBC: 0 % (ref 0.0–0.2)

## 2019-03-23 MED ORDER — METOCLOPRAMIDE HCL 5 MG PO TABS: 5.0000 mg | ORAL_TABLET | Freq: Three times a day (TID) | ORAL | 1 refills | Status: DC | PRN

## 2019-03-23 MED ORDER — DOCUSATE SODIUM 100 MG PO CAPS: 100.0000 mg | ORAL_CAPSULE | Freq: Two times a day (BID) | ORAL | 0 refills | Status: DC

## 2019-03-23 MED ORDER — ENOXAPARIN SODIUM 40 MG/0.4ML ~~LOC~~ SOLN: 40.0000 mg | Freq: Two times a day (BID) | SUBCUTANEOUS | 0 refills | Status: DC

## 2019-03-23 MED ORDER — METHOCARBAMOL 500 MG PO TABS: 500.0000 mg | ORAL_TABLET | Freq: Four times a day (QID) | ORAL | 1 refills | Status: DC | PRN

## 2019-03-23 MED ORDER — HYDROCODONE-ACETAMINOPHEN 7.5-325 MG PO TABS: 1.0000 | ORAL_TABLET | ORAL | 0 refills | Status: DC | PRN

## 2019-03-23 NOTE — Progress Notes (Signed)
Physical Therapy Treatment Patient Details Name: Peter [name] MRN: [id number] DOB: September 24, 1960 Today's Date: 03/23/2019    History of Present Illness Per MD: Pt is an 58 y.o. male who has a diagnosis of left knee osteoarthritis and elected for a left total knee arthroplasty after failing nonoperative treatment, including activity modification, pain medication, physical therapy and injections who has significant impairment of their activities of daily living.  Radiographs have demonstrated tricompartmental osteoarthritis joint space narrowing, osteophytes, subchondral sclerosis and cyst formation. PMH includes: Laparotomy, HTN, bipolar disorder, GERD, obesity.    PT Comments    Pt presented with deficits in L knee ROM, strength, transfers, gait, balance, and activity tolerance. Pt was motivated to do stairs exercise and ambulated with CGA as much as he felt able to, but did utilize chair transport to complete the journey to the stairs. At stairs, pt affirmed understanding of education on safe stair ascent/descent and showed good pickup of techniques, preferring to use a SPC for balance while using the R railing for support. Pt was very limited by pain during L knee flex/ext measurement (31-71) and insisted that he could not do more especially with ext, though functionally while standing he was close to having 5-10 degrees of ext. Pt will benefit from HHPT services upon discharge to safely address above deficits for decreased caregiver assistance and eventual return to PLOF.    Follow Up Recommendations  Home health PT     Equipment Recommendations  3in1 (PT)    Recommendations for Other Services       Precautions / Restrictions Precautions Precautions: Fall Restrictions Weight Bearing Restrictions: Yes LLE Weight Bearing: Weight bearing as tolerated    Mobility  Bed Mobility Overal bed mobility: Modified Independent                Transfers Overall transfer level: Needs  assistance Equipment used: Rolling walker (2 wheeled);Straight cane Transfers: Sit to/from Stand Sit to Stand: Min guard         General transfer comment: Cuing required for safety and efficiency, with good carryover  Ambulation/Gait Ambulation/Gait assistance: Min guard Gait Distance (Feet): 125 Feet x 1 + 40 Assistive device: Rolling walker (2 wheeled) Gait Pattern/deviations: Step-through pattern;Decreased step length - right;Decreased step length - left;Antalgic Gait velocity: decreased   General Gait Details: Pt was willing and able to ambulate around his room, but recognized that he needed to conserve energy for the stair, so he utilized chair transport for half of his trip to and from the gym.   Stairs Stairs: Yes Stairs assistance: Min guard, +2 for safety Stair Management: One rail Right;Two rails;Step to pattern;Forwards;With cane Number of Stairs: 10 General stair comments: Pt educated on "up with the good, down with the bad" pattern, with good in-session carryover. Good eccentric and concentric control. SPC was pt's AD of choice.   Wheelchair Mobility    Modified Rankin (Stroke Patients Only)       Balance Overall balance assessment: Mild deficits observed, not formally tested                                          Cognition Arousal/Alertness: Awake/alert Behavior During Therapy: WFL for tasks assessed/performed Overall Cognitive Status: Within Functional Limits for tasks assessed  Exercises Total Joint Exercises Ankle Circles/Pumps: AROM;Strengthening;Both;5 reps;10 reps Quad Sets: AROM;Strengthening;Both;5 reps;10 reps Straight Leg Raises: AROM;Strengthening;Both;5 reps Long Arc Quad: AROM;Strengthening;Both;5 reps;10 reps Knee Flexion: AROM;Both;5 reps;10 reps Goniometric ROM: L knee AROM: 31-71 degrees, limited by 7/10 pain Marching in Standing: AROM;Both;5 reps Other  Exercises Other Exercises: Pt practiced dynamic balance, with and without BUE support, while seated and standing at EOB    General Comments        Pertinent Vitals/Pain Pain Assessment: 0-10 Pain Score: 7  Pain Location: Pain in knee was 5 at rest, 7 at worst and by the end of session. Pain Descriptors / Indicators: Aching;Sore Pain Intervention(s): Limited activity within patient's tolerance;Monitored during session    Home Living                      Prior Function            PT Goals (current goals can now be found in the care plan section) Progress towards PT goals: Progressing toward goals    Frequency    BID      PT Plan Current plan remains appropriate    Co-evaluation              AM-PAC PT "6 Clicks" Mobility   Outcome Measure  Help needed turning from your back to your side while in a flat bed without using bedrails?: None Help needed moving from lying on your back to sitting on the side of a flat bed without using bedrails?: None Help needed moving to and from a bed to a chair (including a wheelchair)?: A Little Help needed standing up from a chair using your arms (e.g., wheelchair or bedside chair)?: A Little Help needed to walk in hospital room?: A Little Help needed climbing 3-5 steps with a railing? : A Lot 6 Click Score: 19    End of Session Equipment Utilized During Treatment: Gait belt;Other (comment)(Chair follow and chair transport.) Activity Tolerance: No increased pain;Patient limited by fatigue;Patient limited by pain Patient left: in bed;with call bell/phone within reach;with bed alarm set;with SCD's reapplied;Other (comment)(L knee Polar Care donned) Nurse Communication: Mobility status PT Visit Diagnosis: Unsteadiness on feet (R26.81);Other abnormalities of gait and mobility (R26.89);Muscle weakness (generalized) (M62.81)     Time: [id number] PT Time Calculation (min) (ACUTE ONLY): 51 min  Charges:                         [name], SPT  03/23/19, 1:03 PM

## 2019-03-23 NOTE — TOC Transition Note (Signed)
Transition of Care [facility]) - CM/SW Discharge Note   Patient Details  Name: Peter [name] MRN: [id number] Date of Birth: Dec 18, 1960  Transition of Care [facility]) CM/SW Contact:  Su Hilt, RN Phone Number: 03/23/2019, 9:29 AM   Clinical Narrative:     Patient to discharge home today and has an appointment for Monday for outpatient PT set up thru Dr. Harlow Mares office.  He will not need the 3 in 1 as planned, I notified Brad with [employer] to cancel the request. No further needs  Final next level of care: OP Rehab(appointment with Outpatient Therapy on Monday) Barriers to Discharge: Barriers Resolved   Patient Goals and CMS Choice Patient states their goals for this hospitalization and ongoing recovery are:: go home CMS Medicare.gov Compare Post Acute Care list provided to:: Patient Choice offered to / list presented to : Patient  Discharge Placement                       Discharge Plan and Services   Discharge Planning Services: CM Consult Post Acute Care Choice: Home Health          DME Arranged: 3-N-1 DME Agency: [employer] Date DME Agency Contacted: 03/22/19 Time DME Agency Contacted: [phone] Representative spoke with at DME Agency: [place]: PT [place]: [facility] [employer] Date [place]: 03/22/19 Time [place]: 1515 Representative spoke with at [place]: [place] ([place]) Interventions     Readmission Risk Interventions No flowsheet data found.

## 2019-03-23 NOTE — Discharge Instructions (Signed)
Continue weight bear as tolerated on the left lower extremity.   ° °Elevate the left lower extremity whenever possible and continue the polar care while elevating the extremity. Patient may shower. No bath or submerging the wound.   ° °Take Lovenox as directed for blood clot prevention. ° °Continue to work on knee range of motion exercises at home as instructed by physical therapy. Continue to use a walker for assistance with ambulation until cleared by physical therapy. ° °Call [phone] with any questions, such as fever > 101.5 degrees, drainage from the wound or shortness of breath. ° ° °

## 2019-03-23 NOTE — Progress Notes (Signed)
Discharge summary reviewed with verbal understanding. Answered all questions.  

## 2019-03-23 NOTE — Discharge Summary (Signed)
Physician Discharge Summary  Patient ID: Peter [name] MRN: [id number] DOB/AGE: 12/06/1960 58 y.o.  Admit date: 03/21/2019 Discharge date: 03/23/2019  Admission Diagnoses:  left knee osteoarthritis <principal problem not specified>  Discharge Diagnoses:  left knee osteoarthritis Active Problems:   S/P TKR (total knee replacement) using cement, left   Past Medical History:  Diagnosis Date  . Bipolar disorder ([place])   . Blood in stool   . Difficult intubation   . Dyspnea   . GERD (gastroesophageal reflux disease)   . Glaucoma    since 2004  . Hypertension    since 2000  . Obesity, unspecified   . Sleep apnea   . Ulcer 2011   gastric  . Unspecified hemorrhoids without mention of complication     Surgeries: Procedure(s): TOTAL KNEE ARTHROPLASTY on 03/21/2019   Consultants (if any):   Discharged Condition: Improved  Hospital Course: Peter [name] is an 58 y.o. male who was admitted 03/21/2019 with a diagnosis of  left knee osteoarthritis <principal problem not specified> and went to the operating room on 03/21/2019 and underwent the above named procedures.    He was given perioperative antibiotics:  Anti-infectives (From admission, onward)   Start     Dose/Rate Route Frequency Ordered Stop   03/21/19 1800  ceFAZolin (ANCEF) IVPB 2g/100 mL premix     2 g 200 mL/hr over 30 Minutes Intravenous Every 6 hours 03/21/19 1531 03/22/19 0002   03/21/19 1308  50,000 units bacitracin in 0.9% normal saline 250 mL irrigation  Status:  Discontinued       As needed 03/21/19 1309 03/21/19 1340   03/21/19 0930  ceFAZolin (ANCEF) 3 g in dextrose 5 % 50 mL IVPB     3 g 100 mL/hr over 30 Minutes Intravenous On call to O.R. 03/21/19 0916 03/21/19 1202   03/21/19 0600  ceFAZolin (ANCEF) 3 g in dextrose 5 % 50 mL IVPB  Status:  Discontinued     3 g 100 mL/hr over 30 Minutes Intravenous On call to O.R. 03/20/19 2245 03/21/19 1531    .  He was given sequential compression devices, early  ambulation, and Lovenox for DVT prophylaxis.  He benefited maximally from the hospital stay and there were no complications.    Recent vital signs:  Vitals:   03/22/19 1502 03/22/19 2334  BP: 121/66 130/71  Pulse: 62 73  Resp: 20 18  Temp: 98.3 F (36.8 C) 98.5 F (36.9 C)  SpO2: 95% 91%    Recent laboratory studies:  Lab Results  Component Value Date   HGB 12.2 (L) 03/23/2019   HGB 12.4 (L) 03/22/2019   HGB 13.7 03/21/2019   Lab Results  Component Value Date   WBC 8.4 03/23/2019   PLT 146 (L) 03/23/2019   Lab Results  Component Value Date   INR 1.0 03/21/2019   Lab Results  Component Value Date   NA 139 03/22/2019   K 3.7 03/22/2019   CL 108 03/22/2019   CO2 25 03/22/2019   BUN 27 (H) 03/22/2019   CREATININE 0.77 03/22/2019   GLUCOSE 103 (H) 03/22/2019    Discharge Medications:   Allergies as of 03/23/2019      Reactions   Oxycodone Other (See Comments)   Causes Hallucinations, auditory and tactile   Isosorbide    Headache, nausea, vomiting      Medication List    STOP taking these medications   aspirin EC 81 MG tablet   ibuprofen 200 MG tablet Commonly known  as: ADVIL     TAKE these medications   atorvastatin 40 MG tablet Commonly known as: LIPITOR Take 1 tablet (40 mg total) by mouth daily. What changed: when to take this   carbamazepine 200 MG tablet Commonly known as: TEGRETOL 3 po q am, 2 qhs. What changed:   how much to take  how to take this  when to take this   cimetidine 400 MG tablet Commonly known as: Tagamet HB Take 1 tablet (400 mg total) by mouth 2 (two) times daily.   docusate sodium 100 MG capsule Commonly known as: COLACE Take 1 capsule (100 mg total) by mouth 2 (two) times daily.   enoxaparin 40 MG/0.4ML injection Commonly known as: LOVENOX Inject 0.4 mLs (40 mg total) into the skin every 12 (twelve) hours for 12 days.   hydrALAZINE 100 MG tablet Commonly known as: APRESOLINE Take 1 tablet (100 mg total) by  mouth 3 (three) times daily.   HYDROcodone-acetaminophen 7.5-325 MG tablet Commonly known as: NORCO Take 1-2 tablets by mouth every 4 (four) hours as needed (pain score 7-10).   Inspra 50 MG tablet Generic drug: eplerenone Take 50 mg by mouth daily.   lamoTRIgine 200 MG tablet Commonly known as: LAMICTAL Take 1 tablet (200 mg total) by mouth at bedtime.   losartan 100 MG tablet Commonly known as: COZAAR Take 1 tablet (100 mg total) by mouth daily.   Lumigan 0.01 % Soln Generic drug: bimatoprost Place 1 drop into both eyes every morning.   methocarbamol 500 MG tablet Commonly known as: ROBAXIN Take 1 tablet (500 mg total) by mouth every 6 (six) hours as needed for muscle spasms.   metoCLOPramide 5 MG tablet Commonly known as: REGLAN Take 1-2 tablets (5-10 mg total) by mouth every 8 (eight) hours as needed for nausea (if ondansetron (ZOFRAN) ineffective.).   metoprolol tartrate 50 MG tablet Commonly known as: LOPRESSOR Take 1 tablet (50 mg total) by mouth 2 (two) times daily.   NON FORMULARY CPAP   Omega-3 1000 MG Caps Take 1,000 mg by mouth 3 (three) times daily.   omeprazole 40 MG capsule Commonly known as: PRILOSEC Take 40 mg by mouth 2 (two) times daily.   pantoprazole 40 MG tablet Commonly known as: PROTONIX Take 1 tablet (40 mg total) by mouth 2 (two) times daily.   sertraline 100 MG tablet Commonly known as: ZOLOFT Take 1 tablet (100 mg total) by mouth daily. What changed: when to take this            Durable Medical Equipment  (From admission, onward)         Start     Ordered   03/23/19 0648  For home use only DME 3 n 1  Once     03/23/19 0647   03/23/19 0648  For home use only DME Walker rolling  Once    Question:  Patient needs a walker to treat with the following condition  Answer:  Osteoarthritis of left knee   03/23/19 0647          Diagnostic Studies: Ct Head Wo Contrast  Result Date: 03/06/2019 CLINICAL DATA:  Head trauma.  Headache. Fell in the shower last week. EXAM: CT HEAD WITHOUT CONTRAST TECHNIQUE: Contiguous axial images were obtained from the base of the skull through the vertex without intravenous contrast. COMPARISON:  None. FINDINGS: Brain: Mild age related volume loss. No evidence of old or acute focal infarction, mass lesion, hemorrhage, hydrocephalus or extra-axial collection. Vascular: No abnormal  vascular finding. Skull: Normal Sinuses/Orbits: Clear/normal Other: None IMPRESSION: No cause of headache identified. No traumatic finding. Mild age related volume loss. Electronically Signed   By: Nelson Chimes M.D.   On: 03/06/2019 15:31   Dg Knee Left Port  Result Date: 03/21/2019 CLINICAL DATA:  Left knee osteoarthritis. Status post left total knee replacement. EXAM: PORTABLE LEFT KNEE - 1-2 VIEW COMPARISON:  None. FINDINGS: AP and lateral views demonstrate that the components of the total knee prosthesis appear in excellent position. No fractures. Expected postsurgical fluid and air. IMPRESSION: Satisfactory appearance of the left knee after total knee replacement. Electronically Signed   By: Lorriane Shire M.D.   On: 03/21/2019 14:31   [place] Or Nerve Block-image Only ([facility])  Result Date: 03/21/2019 There is no interpretation for this exam.  This order is for images obtained during a surgical procedure.  Please See "Surgeries" Tab for more information regarding the procedure.    Disposition: Discharge disposition: 01-Home or Self Care            Signed: Carlynn Spry ,PA-C 03/23/2019, 6:48 AM

## 2019-03-26 DIAGNOSIS — Z96612 Presence of left artificial shoulder joint: Secondary | ICD-10-CM | POA: Insufficient documentation

## 2019-04-03 ENCOUNTER — Encounter: Payer: Self-pay | Admitting: Orthopedic Surgery

## 2019-04-06 NOTE — H&P (Signed)
PREOPERATIVE H&P  Chief Complaint: left knee osteoarthritis  HPI: Peter [name] is a 58 y.o. male who presents for preoperative history and physical with a diagnosis of left knee osteoarthritis. Symptoms are rated as moderate to severe, and have been worsening.  This is significantly impairing activities of daily living.  He has elected for surgical management.   Past Medical History:  Diagnosis Date  . Bipolar disorder ([place])   . Blood in stool   . Difficult intubation   . Dyspnea   . GERD (gastroesophageal reflux disease)   . Glaucoma    since 2004  . Hypertension    since 2000  . Obesity, unspecified   . Sleep apnea   . Ulcer 2011   gastric  . Unspecified hemorrhoids without mention of complication    Past Surgical History:  Procedure Laterality Date  . APPLICATION OF WOUND VAC N/A 11/16/2016   Procedure: APPLICATION OF WOUND VAC-ABDOMINAL;  Surgeon: Florene Glen, MD;  Location: [facility];  Service: General;  Laterality: N/A;  . APPLICATION OF WOUND VAC N/A 11/18/2016   Procedure: APPLICATION OF WOUND VAC;  Surgeon: Florene Glen, MD;  Location: [facility];  Service: General;  Laterality: N/A;  change  . APPLICATION OF WOUND VAC N/A 11/23/2016   Procedure: WOUND VAC CHANGE;  Surgeon: Olean Ree, MD;  Location: [facility];  Service: General;  Laterality: N/A;  wound vac application  . CARDIAC CATHETERIZATION Left 06/23/2016   Procedure: Left Heart Cath and Coronary Angiography;  Surgeon: Nelva Bush, MD;  Location: [facility];  Service: Cardiovascular;  Laterality: Left;  . CHOLECYSTECTOMY  1992  . COLONOSCOPY W/ POLYPECTOMY  05/22/2010   57mmtransverse colon polyp, traditional serrated adenoma,negative for high grade dysplasia & malignancy. rectal polyp-104mm,negative for dysplasia & malignancy.  Marland Kitchen DRESSING CHANGE UNDER ANESTHESIA N/A 11/21/2016   Procedure: DRESSING CHANGE UNDER ANESTHESIA;  Surgeon: Florene Glen, MD;  Location: [facility];  Service:  General;  Laterality: N/A;  . KNEE SURGERY Left 2010  . LAPAROSCOPIC APPENDECTOMY N/A 10/30/2016   Procedure: APPENDECTOMY LAPAROSCOPIC changed  to open application of wound vac;  Surgeon: [name], MD;  Location: [facility];  Service: General;  Laterality: N/A;  . LAPAROTOMY N/A 11/11/2016   Procedure: EXPLORATORY LAPAROTOMY, DEBRIDEMENT OF ABDOMINAL WOUND, ABDOMINAL [place];  Surgeon: [name], MD;  Location: [facility];  Service: General;  Laterality: N/A;  . LAPAROTOMY N/A 11/12/2016   Procedure: EXPLORATORY LAPAROTOMY;  Surgeon: Clayburn Pert, MD;  Location: [facility];  Service: General;  Laterality: N/A;  . LAPAROTOMY N/A 11/14/2016   Procedure: EXPLORATORY LAPAROTOMY; Irrigation, partial closure;  Surgeon: Clayburn Pert, MD;  Location: [facility];  Service: General;  Laterality: N/A;  . TOTAL KNEE ARTHROPLASTY Left 03/21/2019   Procedure: TOTAL KNEE ARTHROPLASTY;  Surgeon: Lovell Sheehan, MD;  Location: [facility];  Service: Orthopedics;  Laterality: Left;   Social History   Socioeconomic History  . Marital status: Married    Spouse name: Not on file  . Number of children: Not on file  . Years of education: Not on file  . Highest education level: Not on file  Occupational History  . Not on file  Social Needs  . Financial resource strain: Not on file  . Food insecurity    Worry: Not on file    Inability: Not on file  . Transportation needs    Medical: Not on file    Non-medical: Not on file  Tobacco Use  .  Smoking status: Never Smoker  . Smokeless tobacco: Never Used  Substance and Sexual Activity  . Alcohol use: Yes    Comment: occasionally  . Drug use: No  . Sexual activity: Not on file  Lifestyle  . Physical activity    Days per week: Not on file    Minutes per session: Not on file  . Stress: Not on file  Relationships  . Social Herbalist on phone: Not on file    Gets together: Not on file    Attends religious service: Not on file    Active  member of club or organization: Not on file    Attends meetings of clubs or organizations: Not on file    Relationship status: Not on file  Other Topics Concern  . Not on file  Social History Narrative  . Not on file   Family History  Problem Relation Age of Onset  . Cancer Other        colon  . Hyperlipidemia Mother   . Hypertension Mother    Allergies  Allergen Reactions  . Oxycodone Other (See Comments)    Causes Hallucinations, auditory and tactile  . Isosorbide     Headache, nausea, vomiting   Prior to Admission medications   Medication Sig Start Date End Date Taking? Authorizing Provider  atorvastatin (LIPITOR) 40 MG tablet Take 1 tablet (40 mg total) by mouth daily. Patient taking differently: Take 40 mg by mouth daily at 6 PM.  01/29/19  Yes Jerrol Banana., MD  carbamazepine (TEGRETOL) 200 MG tablet 3 po q am, 2 qhs. Patient taking differently: Take 400-600 mg by mouth See admin instructions. 3 po q am, 2 qhs. 12/13/18  Yes Hurst, Teresa T, PA-C  cimetidine (TAGAMET) 400 MG tablet Take 1 tablet (400 mg total) by mouth 2 (two) times daily. 03/15/19  Yes Jerrol Banana., MD  eplerenone (INSPRA) 50 MG tablet Take 50 mg by mouth daily.   Yes [provider]  hydrALAZINE (APRESOLINE) 100 MG tablet Take 1 tablet (100 mg total) by mouth 3 (three) times daily. 01/29/19  Yes Jerrol Banana., MD  lamoTRIgine (LAMICTAL) 200 MG tablet Take 1 tablet (200 mg total) by mouth at bedtime. 12/13/18  Yes Hurst, Helene Kelp T, PA-C  losartan (COZAAR) 100 MG tablet Take 1 tablet (100 mg total) by mouth daily. 01/29/19  Yes Jerrol Banana., MD  LUMIGAN 0.01 % SOLN Place 1 drop into both eyes every morning.  08/19/17  Yes [provider]  metoprolol tartrate (LOPRESSOR) 50 MG tablet Take 1 tablet (50 mg total) by mouth 2 (two) times daily. 01/29/19  Yes Jerrol Banana., MD  Omega-3 1000 MG CAPS Take 1,000 mg by mouth 3 (three) times daily.    Yes [provider]  sertraline (ZOLOFT) 100 MG tablet Take 1 tablet (100 mg total) by mouth daily. Patient taking differently: Take 100 mg by mouth 2 (two) times daily.  01/29/19  Yes Jerrol Banana., MD  docusate sodium (COLACE) 100 MG capsule Take 1 capsule (100 mg total) by mouth 2 (two) times daily. 03/23/19   Carlynn Spry, PA-C  enoxaparin (LOVENOX) 40 MG/0.4ML injection Inject 0.4 mLs (40 mg total) into the skin every 12 (twelve) hours for 12 days. 03/23/19 04/04/19  Carlynn Spry, PA-C  HYDROcodone-acetaminophen (NORCO) 7.5-325 MG tablet Take 1-2 tablets by mouth every 4 (four) hours as needed (pain score 7-10). 03/23/19   Carlynn Spry,  PA-C  methocarbamol (ROBAXIN) 500 MG tablet Take 1 tablet (500 mg total) by mouth every 6 (six) hours as needed for muscle spasms. 03/23/19   Carlynn Spry, PA-C  metoCLOPramide (REGLAN) 5 MG tablet Take 1-2 tablets (5-10 mg total) by mouth every 8 (eight) hours as needed for nausea (if ondansetron (ZOFRAN) ineffective.). 03/23/19   Carlynn Spry, PA-C  NON FORMULARY CPAP    [provider]  omeprazole (PRILOSEC) 40 MG capsule Take 40 mg by mouth 2 (two) times daily.    [provider]  pantoprazole (PROTONIX) 40 MG tablet Take 1 tablet (40 mg total) by mouth 2 (two) times daily. Patient not taking: Reported on 03/21/2019 03/19/19   Jerrol Banana., MD     Positive ROS: All other systems have been reviewed and were otherwise negative with the exception of those mentioned in the HPI and as above.  Physical Exam: General: Alert, no acute distress Cardiovascular: Regular rate and rhythm, no murmurs rubs or gallops.  No pedal edema Respiratory: Clear to auscultation bilaterally, no wheezes rales or rhonchi. No cyanosis, no use of accessory musculature GI: No organomegaly, abdomen is soft and non-tender nondistended with positive bowel sounds. Skin: Skin intact, no lesions within the operative field. Neurologic: Sensation intact  distally Psychiatric: Patient is competent for consent with normal mood and affect Lymphatic: No axillary or cervical lymphadenopathy  MUSCULOSKELETAL: left knee with +effusion, +JLT, crepitus  Assessment: left knee osteoarthritis  Plan: Plan for Procedure(s): TOTAL KNEE ARTHROPLASTY, LEFT  I discussed the risks and benefits of surgery. The risks include but are not limited to infection, bleeding requiring blood transfusion, nerve or blood vessel injury, joint stiffness or loss of motion, persistent pain, weakness or instability, malunion, nonunion and hardware failure and the need for further surgery. Medical risks include but are not limited to DVT and pulmonary embolism, myocardial infarction, stroke, pneumonia, respiratory failure and death. Patient understood these risks and wished to proceed.   Lovell Sheehan, MD   04/06/2019 12:08 PM

## 2019-04-24 ENCOUNTER — Ambulatory Visit (INDEPENDENT_AMBULATORY_CARE_PROVIDER_SITE_OTHER): Payer: 59 | Admitting: Physician Assistant

## 2019-04-24 ENCOUNTER — Ambulatory Visit: Payer: Managed Care, Other (non HMO) | Admitting: Gastroenterology

## 2019-04-24 ENCOUNTER — Encounter: Payer: Self-pay | Admitting: Physician Assistant

## 2019-04-24 ENCOUNTER — Other Ambulatory Visit: Payer: Self-pay

## 2019-04-24 DIAGNOSIS — F411 Generalized anxiety disorder: Secondary | ICD-10-CM

## 2019-04-24 DIAGNOSIS — F319 Bipolar disorder, unspecified: Secondary | ICD-10-CM | POA: Diagnosis not present

## 2019-04-24 DIAGNOSIS — F4323 Adjustment disorder with mixed anxiety and depressed mood: Secondary | ICD-10-CM | POA: Diagnosis not present

## 2019-04-24 DIAGNOSIS — Z79899 Other long term (current) drug therapy: Secondary | ICD-10-CM

## 2019-04-24 DIAGNOSIS — R413 Other amnesia: Secondary | ICD-10-CM

## 2019-04-24 MED ORDER — B COMPLEX VITAMINS PO CAPS: 1.0000 | ORAL_CAPSULE | Freq: Every day | ORAL | 11 refills | Status: DC

## 2019-04-24 MED ORDER — CARBAMAZEPINE 200 MG PO TABS: ORAL_TABLET | ORAL | 1 refills | Status: DC

## 2019-04-24 MED ORDER — LAMOTRIGINE 200 MG PO TABS: 200.0000 mg | ORAL_TABLET | Freq: Every day | ORAL | 1 refills | Status: DC

## 2019-04-24 NOTE — Progress Notes (Signed)
[facility] Med Check  Patient ID: Peter [name],  MRN: [id number]  PCP: Jerrol Banana., MD  Date of Evaluation: 04/24/2019 Time spent:30 minutes  Chief Complaint:  Chief Complaint    Follow-up      HISTORY/CURRENT STATUS: HPI for routine med check.  Accompanied by wife.  Patient and his wife state that his mood is good.  He is able to enjoy things although physically limited right now due to knee replacement 5 weeks ago.  He has been sad because he lost his job after working at the same place for 45 years several months back.  States he is trying to make the best of everything however.  Energy and motivation are good.  No easy crying, no suicidal or homicidal thoughts.  No increased energy with decreased need for sleep.  No impulsivity or risky behaviors.  No increased libido or spending.  No hallucinations.  No grandiosity.  The biggest problem that he has is memory loss.  His wife sees him more than he does.  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  The patient agrees that his memory did get worse after that but feels that it is more age-related than anything.  Denies dizziness, syncope, seizures, numbness, tingling, tremor, tics, unsteady gait, slurred speech, confusion. Denies muscle or joint pain, stiffness, or dystonia.  Individual Medical History/ Review of Systems: Changes? :Yes left knee replacement 5 weeks ago.  Denies headache, blurred vision, earache, sore throat no cough or cold symptoms, no chest pain, palpitations or shortness of breath, no nausea or vomiting, no constipation or diarrhea or urinary symptoms.  Past medications for mental health diagnoses include: Zoloft, Cymbalta did not help, Pristiq  did not help, Seroquel, Xanax, Depakote, Lamictal, Equetro, Klonopin  Allergies: Oxycodone and Isosorbide  Current Medications:  Current Outpatient Medications:  .  atorvastatin (LIPITOR) 40 MG tablet, Take 1 tablet (40 mg total) by mouth daily. (Patient taking differently: Take 40 mg by mouth daily at 6 PM. ), Disp: 90 tablet, Rfl: 3 .  carbamazepine (TEGRETOL) 200 MG tablet, 3 po q am, 2 qhs., Disp: 450 tablet, Rfl: 1 .  celecoxib (CELEBREX) 100 MG capsule, , Disp: , Rfl:  .  cimetidine (TAGAMET) 400 MG tablet, Take 1 tablet (400 mg total) by mouth 2 (two) times daily., Disp: 90 tablet, Rfl: 1 .  eplerenone (INSPRA) 50 MG tablet, Take 50 mg by mouth daily., Disp: , Rfl:  .  hydrALAZINE (APRESOLINE) 100 MG tablet, Take 1 tablet (100 mg total) by mouth 3 (three) times daily., Disp: 270 tablet, Rfl: 3 .  HYDROmorphone (DILAUDID) 2 MG tablet, TK 1 T PO Q 4 H, Disp: , Rfl:  .  lamoTRIgine (LAMICTAL) 200 MG tablet, Take 1 tablet (200 mg total) by mouth at bedtime., Disp: 90 tablet, Rfl: 1 .  losartan (COZAAR) 100 MG tablet, Take 1 tablet (100 mg total) by mouth daily., Disp: 90 tablet, Rfl: 3 .  LUMIGAN 0.01 % SOLN, Place 1 drop into both eyes every morning. , Disp: , Rfl: 7 .  methocarbamol (ROBAXIN) 500 MG tablet, Take 1 tablet (500 mg total) by mouth every 6 (six) hours as needed for muscle spasms., Disp: 40 tablet, Rfl: 1 .  metoprolol tartrate (LOPRESSOR) 50 MG tablet, Take 1 tablet (50 mg total) by mouth 2 (two) times daily., Disp: 180 tablet, Rfl: 3 .  NON FORMULARY, CPAP, Disp: , Rfl:  .  Omega-3 1000 MG CAPS, Take 1,000 mg by mouth 3 (three) times daily. , Disp: , Rfl:  .  promethazine (PHENERGAN) 12.5 MG tablet, promethazine 12.5 mg tablet  Take 1 tablet 3 times a day by oral route for 30 days., Disp: , Rfl:  .  sertraline (ZOLOFT) 100 MG tablet, Take 1 tablet (100 mg total) by mouth daily. (Patient taking differently: Take 100 mg by mouth 2 (two) times daily. ), Disp: 90 tablet, Rfl: 3 .   docusate sodium (COLACE) 100 MG capsule, Take 1 capsule (100 mg total) by mouth 2 (two) times daily. (Patient not taking: Reported on 04/24/2019), Disp: 20 capsule, Rfl: 0 .  enoxaparin (LOVENOX) 40 MG/0.4ML injection, Inject 0.4 mLs (40 mg total) into the skin every 12 (twelve) hours for 12 days., Disp: 9.6 mL, Rfl: 0 .  HYDROcodone-acetaminophen (NORCO) 7.5-325 MG tablet, Take 1-2 tablets by mouth every 4 (four) hours as needed (pain score 7-10). (Patient not taking: Reported on 04/24/2019), Disp: 40 tablet, Rfl: 0 .  metoCLOPramide (REGLAN) 5 MG tablet, Take 1-2 tablets (5-10 mg total) by mouth every 8 (eight) hours as needed for nausea (if ondansetron (ZOFRAN) ineffective.). (Patient not taking: Reported on 04/24/2019), Disp: 30 tablet, Rfl: 1 .  NARCAN 4 MG/0.1ML LIQD nasal spray kit, USE 1 SPRAY IEN UTD PRN, Disp: , Rfl:  .  omeprazole (PRILOSEC) 40 MG capsule, Take 40 mg by mouth 2 (two) times daily., Disp: , Rfl:  .  pantoprazole (PROTONIX) 40 MG tablet, Take 1 tablet (40 mg total) by mouth 2 (two) times daily. (Patient not taking: Reported on 03/21/2019), Disp: 60 tablet, Rfl: 1 Medication Side Effects: none  Family Medical/ Social History: Changes? No  MENTAL HEALTH EXAM:  There were no vitals taken for this visit.There is no height or weight on file to calculate BMI.  General Appearance: Casual, Neat, Well Groomed and Obese  Eye Contact:  Good  Speech:  Clear and Coherent  Volume:  Normal  Mood:  Euthymic  Affect:  Appropriate  Thought Process:  Goal Directed and Descriptions of Associations: Intact  Orientation:  Full (Time, Place, and Person)  Thought Content: Logical   Suicidal Thoughts:  No  Homicidal Thoughts:  No  Memory:  WNL  Judgement:  Good  Insight:  Good  Psychomotor Activity:  walks with cane  Concentration:  Concentration: Good  Recall:  Good  Fund of Knowledge: Good  Language: Good  Assets:  Desire for Improvement  ADL's:  Intact  Cognition: WNL   Prognosis:  Good  Mini-Mental Status exam is grossly within normal limits.  DIAGNOSES:    ICD-10-CM   1. Bipolar I disorder ([place])  F31.9   2. Generalized anxiety disorder  F41.1   3. Adjustment disorder with mixed anxiety and depressed mood  F43.23   4. Encounter for long-term (current) use of medications  Z79.899 CBC with Differential/Platelet    B12 and Folate Panel    TSH    Carbamazepine level, total    Comprehensive metabolic panel  5. Memory loss  R41.3     Receiving Psychotherapy: No    RECOMMENDATIONS:  I have reviewed his preop labs.  I see that he was slightly anemic with a hemoglobin of 12.2.  I recommend we obtain some labs looking for B12 deficiency, thyroid  issues or other metabolic problems.  He and his wife agree.  After he has the labs drawn I would recommend he:  start B complex over-the-counter. Continue carbamazepine 200 mg, 3 p.o. every morning, 2 p.o. nightly. Continue Lamictal 200 mg nightly. Continue Zoloft 100 mg, 2 p.o. daily per Dr. Rosanna Randy. Draw CBC with differential, B12 and folate, carbamazepine level, CMP, and TSH. Return in 2 to 3 months.  Donnal Moat, PA-C

## 2019-05-01 LAB — CBC WITH DIFFERENTIAL/PLATELET
Basophils Absolute: 0 10*3/uL (ref 0.0–0.2)
Basos: 1 %
EOS (ABSOLUTE): 0.2 10*3/uL (ref 0.0–0.4)
Eos: 3 %
Hematocrit: 40 % (ref 37.5–51.0)
Hemoglobin: 13.1 g/dL (ref 13.0–17.7)
Immature Grans (Abs): 0 10*3/uL (ref 0.0–0.1)
Immature Granulocytes: 0 %
Lymphocytes Absolute: 1.6 10*3/uL (ref 0.7–3.1)
Lymphs: 32 %
MCH: 29 pg (ref 26.6–33.0)
MCHC: 32.8 g/dL (ref 31.5–35.7)
MCV: 89 fL (ref 79–97)
Monocytes Absolute: 0.4 10*3/uL (ref 0.1–0.9)
Monocytes: 8 %
Neutrophils Absolute: 2.7 10*3/uL (ref 1.4–7.0)
Neutrophils: 56 %
Platelets: 206 10*3/uL (ref 150–450)
RBC: 4.52 x10E6/uL (ref 4.14–5.80)
RDW: 13 % (ref 11.6–15.4)
WBC: 4.9 10*3/uL (ref 3.4–10.8)

## 2019-05-01 LAB — COMPREHENSIVE METABOLIC PANEL
ALT: 16 IU/L (ref 0–44)
AST: 27 IU/L (ref 0–40)
Albumin/Globulin Ratio: 1.6 (ref 1.2–2.2)
Albumin: 3.9 g/dL (ref 3.8–4.9)
Alkaline Phosphatase: 144 IU/L — ABNORMAL HIGH (ref 39–117)
BUN/Creatinine Ratio: 17 (ref 10–24)
BUN: 15 mg/dL (ref 8–27)
Bilirubin Total: 0.4 mg/dL (ref 0.0–1.2)
CO2: 24 mmol/L (ref 20–29)
Calcium: 8.7 mg/dL (ref 8.6–10.2)
Chloride: 103 mmol/L (ref 96–106)
Creatinine, Ser: 0.87 mg/dL (ref 0.76–1.27)
GFR calc Af Amer: 108 mL/min/{1.73_m2} (ref >59)
GFR calc non Af Amer: 94 mL/min/{1.73_m2} (ref >59)
Globulin, Total: 2.4 g/dL (ref 1.5–4.5)
Glucose: 96 mg/dL (ref 65–99)
Potassium: 3.6 mmol/L (ref 3.5–5.2)
Sodium: 139 mmol/L (ref 134–144)
Total Protein: 6.3 g/dL (ref 6.0–8.5)

## 2019-05-01 LAB — CARBAMAZEPINE LEVEL, TOTAL: Carbamazepine (Tegretol), S: 10.9 ug/mL (ref 4.0–12.0)

## 2019-05-01 LAB — TSH: TSH: 2.44 u[IU]/mL (ref 0.450–4.500)

## 2019-05-01 LAB — B12 AND FOLATE PANEL
Folate: 4.2 ng/mL (ref >3.0)
Vitamin B-12: 376 pg/mL (ref 232–1245)

## 2019-05-02 NOTE — Progress Notes (Signed)
Pt. Made aware and verbalized understanding.

## 2019-05-02 NOTE — Progress Notes (Signed)
Please let him know all labs are perfect except Alk Phos, one of liver tests, is a little high.  Let's repeat that level in about 3 months.  I don't think it's cause for concern but I don't want to ignore it either.  Have him avoid alcohol, if he drinks, and avoid Tylenol. Tegretol level was good.  Cont same doses of everything.

## 2019-05-15 NOTE — Progress Notes (Signed)
Patient: Peter [name] Male    DOB: 06/13/1961   58 y.o.   MRN: [id number] Visit Date: 05/16/2019  Today's Provider: [name], MD   Chief Complaint  Patient presents with  . wants a referral   Subjective:   HPI Patient comes in today to discuss a referral. He is requesting a referral to a neurologist. His wife is worried about some MCI>   Allergies  Allergen Reactions  . Oxycodone Other (See Comments)    Causes Hallucinations, auditory and tactile  . Isosorbide     Headache, nausea, vomiting     Current Outpatient Medications:  .  atorvastatin (LIPITOR) 40 MG tablet, Take 1 tablet (40 mg total) by mouth daily. (Patient taking differently: Take 40 mg by mouth daily at 6 PM. ), Disp: 90 tablet, Rfl: 3 .  b complex vitamins capsule, Take 1 capsule by mouth daily., Disp: 30 capsule, Rfl: 11 .  carbamazepine (TEGRETOL) 200 MG tablet, 3 po q am, 2 qhs., Disp: 450 tablet, Rfl: 1 .  celecoxib (CELEBREX) 100 MG capsule, , Disp: , Rfl:  .  cimetidine (TAGAMET) 400 MG tablet, Take 1 tablet (400 mg total) by mouth 2 (two) times daily., Disp: 90 tablet, Rfl: 1 .  eplerenone (INSPRA) 50 MG tablet, Take 50 mg by mouth daily., Disp: , Rfl:  .  HYDROmorphone (DILAUDID) 2 MG tablet, TK 1 T PO Q 4 H, Disp: , Rfl:  .  lamoTRIgine (LAMICTAL) 200 MG tablet, Take 1 tablet (200 mg total) by mouth at bedtime., Disp: 90 tablet, Rfl: 1 .  losartan (COZAAR) 100 MG tablet, Take 1 tablet (100 mg total) by mouth daily., Disp: 90 tablet, Rfl: 3 .  LUMIGAN 0.01 % SOLN, Place 1 drop into both eyes every morning. , Disp: , Rfl: 7 .  methocarbamol (ROBAXIN) 500 MG tablet, Take 1 tablet (500 mg total) by mouth every 6 (six) hours as needed for muscle spasms., Disp: 40 tablet, Rfl: 1 .  metoprolol tartrate (LOPRESSOR) 50 MG tablet, Take 1 tablet (50 mg total) by mouth 2 (two) times daily., Disp: 180 tablet, Rfl: 3 .  NARCAN 4 MG/0.1ML LIQD nasal spray kit, USE 1 SPRAY IEN UTD PRN, Disp: , Rfl:   .  NON FORMULARY, CPAP, Disp: , Rfl:  .  Omega-3 1000 MG CAPS, Take 1,000 mg by mouth 3 (three) times daily. , Disp: , Rfl:  .  sertraline (ZOLOFT) 100 MG tablet, Take 1 tablet (100 mg total) by mouth daily. (Patient taking differently: Take 100 mg by mouth 2 (two) times daily. ), Disp: 90 tablet, Rfl: 3 .  docusate sodium (COLACE) 100 MG capsule, Take 1 capsule (100 mg total) by mouth 2 (two) times daily. (Patient not taking: Reported on 04/24/2019), Disp: 20 capsule, Rfl: 0 .  enoxaparin (LOVENOX) 40 MG/0.4ML injection, Inject 0.4 mLs (40 mg total) into the skin every 12 (twelve) hours for 12 days., Disp: 9.6 mL, Rfl: 0 .  hydrALAZINE (APRESOLINE) 100 MG tablet, Take 1 tablet (100 mg total) by mouth 3 (three) times daily., Disp: 270 tablet, Rfl: 3 .  HYDROcodone-acetaminophen (NORCO) 7.5-325 MG tablet, Take 1-2 tablets by mouth every 4 (four) hours as needed (pain score 7-10). (Patient not taking: Reported on 04/24/2019), Disp: 40 tablet, Rfl: 0 .  metoCLOPramide (REGLAN) 5 MG tablet, Take 1-2 tablets (5-10 mg total) by mouth every 8 (eight) hours as needed for nausea (if ondansetron (ZOFRAN) ineffective.). (Patient not taking: Reported on 04/24/2019),  Disp: 30 tablet, Rfl: 1 .  omeprazole (PRILOSEC) 40 MG capsule, Take 40 mg by mouth 2 (two) times daily., Disp: , Rfl:  .  pantoprazole (PROTONIX) 40 MG tablet, Take 1 tablet (40 mg total) by mouth 2 (two) times daily. (Patient not taking: Reported on 03/21/2019), Disp: 60 tablet, Rfl: 1 .  promethazine (PHENERGAN) 12.5 MG tablet, promethazine 12.5 mg tablet  Take 1 tablet 3 times a day by oral route for 30 days., Disp: , Rfl:   Review of Systems  Constitutional: Negative for activity change and fatigue.  Eyes: Negative.   Respiratory: Negative for cough and shortness of breath.   Cardiovascular: Negative for chest pain, palpitations and leg swelling.  Endocrine: Negative for cold intolerance, heat intolerance, polydipsia, polyphagia and polyuria.   Musculoskeletal: Positive for arthralgias.  Skin: Negative for pallor, rash and wound.  Allergic/Immunologic: Negative.   Neurological: Negative for dizziness, light-headedness and headaches.  Hematological: Negative for adenopathy. Does not bruise/bleed easily.  Psychiatric/Behavioral: Negative for agitation, self-injury and suicidal ideas. The patient is not nervous/anxious.     Social History   Tobacco Use  . Smoking status: Never Smoker  . Smokeless tobacco: Never Used  Substance Use Topics  . Alcohol use: Not Currently    Comment: occasionally      Objective:   BP (!) 150/82   Pulse 64   Temp (!) 97.1 F (36.2 C)   Resp 16   Wt 282 lb (127.9 kg)   SpO2 95%   BMI 44.17 kg/m  Vitals:   05/16/19 1042  BP: (!) 150/82  Pulse: 64  Resp: 16  Temp: (!) 97.1 F (36.2 C)  SpO2: 95%  Weight: 282 lb (127.9 kg)  Body mass index is 44.17 kg/m.   Physical Exam Vitals signs reviewed.  Constitutional:      Appearance: He is well-developed.     Comments: Obese white male in no acute distress  HENT:     Head: Normocephalic and atraumatic.     Right Ear: External ear normal.     Left Ear: External ear normal.     Nose: Nose normal.  Eyes:     General: No scleral icterus.    Conjunctiva/sclera: Conjunctivae normal.  Neck:     Thyroid: No thyromegaly.     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     Heart sounds: Normal heart sounds.  Pulmonary:     Effort: Pulmonary effort is normal.     Breath sounds: Normal breath sounds.  Abdominal:     General: Bowel sounds are normal. There is no distension.     Tenderness: There is no abdominal tenderness.     Comments: Midline scar is surrounded by can very firm scar tissue probably 3 inches on either side of the scar.  Skin:    General: Skin is warm and dry.  Neurological:     Mental Status: He is alert and oriented to person, place,  and time.     Cranial Nerves: No cranial nerve deficit.     Sensory: No sensory deficit.     Motor: No abnormal muscle tone.  Psychiatric:        Mood and Affect: Mood normal.        Behavior: Behavior normal.        Thought Content: Thought content normal.        Judgment: Judgment normal.   MMSE 25/30  today   No results found for any visits on 05/16/19.     Assessment & Plan    1. MCI (mild cognitive impairment) Could be worsened by depression or significant time pt had to be sedated during his lengthy illness in 2019. - Ambulatory referral to Neurology  2. Essential (primary) hypertension   3. Obstructive sleep apnea syndrome On CPAP.  4. Testicular hypofunction   5. Adjustment disorder with mixed anxiety and depressed mood May need psychiatry rereferral in future  6. Class 3 severe obesity due to excess calories with serious comorbidity and body mass index (BMI) of 40.0 to 44.9 in adult [facility]) With OSA/HTN.      Cranford Mon, MD  [facility]

## 2019-05-16 ENCOUNTER — Other Ambulatory Visit: Payer: Self-pay

## 2019-05-16 ENCOUNTER — Encounter: Payer: Self-pay | Admitting: Family Medicine

## 2019-05-16 ENCOUNTER — Ambulatory Visit (INDEPENDENT_AMBULATORY_CARE_PROVIDER_SITE_OTHER): Payer: Managed Care, Other (non HMO) | Admitting: Family Medicine

## 2019-05-16 VITALS — BP 150/82 | HR 64 | Temp 97.1°F | Resp 16 | Wt 282.0 lb

## 2019-05-16 DIAGNOSIS — I10 Essential (primary) hypertension: Secondary | ICD-10-CM | POA: Diagnosis not present

## 2019-05-16 DIAGNOSIS — E291 Testicular hypofunction: Secondary | ICD-10-CM | POA: Diagnosis not present

## 2019-05-16 DIAGNOSIS — G3184 Mild cognitive impairment, so stated: Secondary | ICD-10-CM | POA: Diagnosis not present

## 2019-05-16 DIAGNOSIS — G4733 Obstructive sleep apnea (adult) (pediatric): Secondary | ICD-10-CM | POA: Diagnosis not present

## 2019-05-16 DIAGNOSIS — F4323 Adjustment disorder with mixed anxiety and depressed mood: Secondary | ICD-10-CM

## 2019-05-16 DIAGNOSIS — Z6841 Body Mass Index (BMI) 40.0 and over, adult: Secondary | ICD-10-CM

## 2019-05-25 ENCOUNTER — Ambulatory Visit (INDEPENDENT_AMBULATORY_CARE_PROVIDER_SITE_OTHER): Payer: Managed Care, Other (non HMO) | Admitting: Family Medicine

## 2019-05-25 ENCOUNTER — Encounter: Payer: Self-pay | Admitting: Family Medicine

## 2019-05-25 VITALS — Temp 98.2°F

## 2019-05-25 DIAGNOSIS — J309 Allergic rhinitis, unspecified: Secondary | ICD-10-CM

## 2019-05-25 NOTE — Progress Notes (Signed)
Patient: Peter [name] Male    DOB: 02-08-1961   58 y.o.   MRN: [id number] Visit Date: 05/25/2019  Today's Provider: Vernie Murders, PA   No chief complaint on file.   Subjective:    Virtual Visit via Video Note  I connected with Peter [name] on 05/25/19 at  8:20 AM EST by a video enabled telemedicine application and verified that I am speaking with the correct person using two identifiers.  Location: Patient: Home Provider: Office   HPI: This 57 year old male developed sneezing, rhinorrhea and scratchy throat over the past few days. No cough, fever, shortness of breath, wheezing, sputum production, headache or body aches. Uses Fluticasone Nasal Spray for allergy symptoms. Has not had exposure to anyone COVID positive in the past 15 days.  Past Medical History:  Diagnosis Date  . Bipolar disorder ([place])   . Blood in stool   . Difficult intubation   . Dyspnea   . GERD (gastroesophageal reflux disease)   . Glaucoma    since 2004  . Hypertension    since 2000  . Obesity, unspecified   . Sleep apnea   . Ulcer 2011   gastric  . Unspecified hemorrhoids without mention of complication    Past Surgical History:  Procedure Laterality Date  . APPLICATION OF WOUND VAC N/A 11/16/2016   Procedure: APPLICATION OF WOUND VAC-ABDOMINAL;  Surgeon: Florene Glen, MD;  Location: [facility];  Service: General;  Laterality: N/A;  . APPLICATION OF WOUND VAC N/A 11/18/2016   Procedure: APPLICATION OF WOUND VAC;  Surgeon: Florene Glen, MD;  Location: [facility];  Service: General;  Laterality: N/A;  change  . APPLICATION OF WOUND VAC N/A 11/23/2016   Procedure: WOUND VAC CHANGE;  Surgeon: Olean Ree, MD;  Location: [facility];  Service: General;  Laterality: N/A;  wound vac application  . CARDIAC CATHETERIZATION Left 06/23/2016   Procedure: Left Heart Cath and Coronary Angiography;  Surgeon: Nelva Bush, MD;  Location: [facility];  Service: Cardiovascular;   Laterality: Left;  . CHOLECYSTECTOMY  1992  . COLONOSCOPY W/ POLYPECTOMY  05/22/2010   10mtransverse colon polyp, traditional serrated adenoma,negative for high grade dysplasia & malignancy. rectal polyp-540mnegative for dysplasia & malignancy.  . Marland KitchenRESSING CHANGE UNDER ANESTHESIA N/A 11/21/2016   Procedure: DRESSING CHANGE UNDER ANESTHESIA;  Surgeon: CoFlorene GlenMD;  Location: [facility];  Service: General;  Laterality: N/A;  . KNEE SURGERY Left 2010  . LAPAROSCOPIC APPENDECTOMY N/A 10/30/2016   Procedure: APPENDECTOMY LAPAROSCOPIC changed  to open application of wound vac;  Surgeon: DiJules HusbandsMD;  Location: [facility];  Service: General;  Laterality: N/A;  . LAPAROTOMY N/A 11/11/2016   Procedure: EXPLORATORY LAPAROTOMY, DEBRIDEMENT OF ABDOMINAL WOUND, ABDOMINAL WADiaz Surgeon: PaJules HusbandsMD;  Location: [facility];  Service: General;  Laterality: N/A;  . LAPAROTOMY N/A 11/12/2016   Procedure: EXPLORATORY LAPAROTOMY;  Surgeon: WoClayburn PertMD;  Location: [facility];  Service: General;  Laterality: N/A;  . LAPAROTOMY N/A 11/14/2016   Procedure: EXPLORATORY LAPAROTOMY; Irrigation, partial closure;  Surgeon: WoClayburn PertMD;  Location: [facility];  Service: General;  Laterality: N/A;  . TOTAL KNEE ARTHROPLASTY Left 03/21/2019   Procedure: TOTAL KNEE ARTHROPLASTY;  Surgeon: BoLovell SheehanMD;  Location: [facility];  Service: Orthopedics;  Laterality: Left;   Family History  Problem Relation Age of Onset  . Cancer Other        colon  .  Hyperlipidemia Mother   . Hypertension Mother    Allergies  Allergen Reactions  . Oxycodone Other (See Comments)    Causes Hallucinations, auditory and tactile  . Isosorbide     Headache, nausea, vomiting    Current Outpatient Medications:  .  atorvastatin (LIPITOR) 40 MG tablet, Take 1 tablet (40 mg total) by mouth daily. (Patient taking differently: Take 40 mg by mouth daily at 6 PM. ), Disp: 90 tablet, Rfl: 3 .  b complex vitamins  capsule, Take 1 capsule by mouth daily., Disp: 30 capsule, Rfl: 11 .  carbamazepine (TEGRETOL) 200 MG tablet, 3 po q am, 2 qhs., Disp: 450 tablet, Rfl: 1 .  celecoxib (CELEBREX) 100 MG capsule, , Disp: , Rfl:  .  cimetidine (TAGAMET) 400 MG tablet, Take 1 tablet (400 mg total) by mouth 2 (two) times daily., Disp: 90 tablet, Rfl: 1 .  docusate sodium (COLACE) 100 MG capsule, Take 1 capsule (100 mg total) by mouth 2 (two) times daily. (Patient not taking: Reported on 04/24/2019), Disp: 20 capsule, Rfl: 0 .  enoxaparin (LOVENOX) 40 MG/0.4ML injection, Inject 0.4 mLs (40 mg total) into the skin every 12 (twelve) hours for 12 days., Disp: 9.6 mL, Rfl: 0 .  eplerenone (INSPRA) 50 MG tablet, Take 50 mg by mouth daily., Disp: , Rfl:  .  hydrALAZINE (APRESOLINE) 100 MG tablet, Take 1 tablet (100 mg total) by mouth 3 (three) times daily., Disp: 270 tablet, Rfl: 3 .  HYDROcodone-acetaminophen (NORCO) 7.5-325 MG tablet, Take 1-2 tablets by mouth every 4 (four) hours as needed (pain score 7-10). (Patient not taking: Reported on 04/24/2019), Disp: 40 tablet, Rfl: 0 .  HYDROmorphone (DILAUDID) 2 MG tablet, TK 1 T PO Q 4 H, Disp: , Rfl:  .  lamoTRIgine (LAMICTAL) 200 MG tablet, Take 1 tablet (200 mg total) by mouth at bedtime., Disp: 90 tablet, Rfl: 1 .  losartan (COZAAR) 100 MG tablet, Take 1 tablet (100 mg total) by mouth daily., Disp: 90 tablet, Rfl: 3 .  LUMIGAN 0.01 % SOLN, Place 1 drop into both eyes every morning. , Disp: , Rfl: 7 .  methocarbamol (ROBAXIN) 500 MG tablet, Take 1 tablet (500 mg total) by mouth every 6 (six) hours as needed for muscle spasms., Disp: 40 tablet, Rfl: 1 .  metoCLOPramide (REGLAN) 5 MG tablet, Take 1-2 tablets (5-10 mg total) by mouth every 8 (eight) hours as needed for nausea (if ondansetron (ZOFRAN) ineffective.). (Patient not taking: Reported on 04/24/2019), Disp: 30 tablet, Rfl: 1 .  metoprolol tartrate (LOPRESSOR) 50 MG tablet, Take 1 tablet (50 mg total) by mouth 2 (two) times  daily., Disp: 180 tablet, Rfl: 3 .  NARCAN 4 MG/0.1ML LIQD nasal spray kit, USE 1 SPRAY IEN UTD PRN, Disp: , Rfl:  .  NON FORMULARY, CPAP, Disp: , Rfl:  .  Omega-3 1000 MG CAPS, Take 1,000 mg by mouth 3 (three) times daily. , Disp: , Rfl:  .  omeprazole (PRILOSEC) 40 MG capsule, Take 40 mg by mouth 2 (two) times daily., Disp: , Rfl:  .  pantoprazole (PROTONIX) 40 MG tablet, Take 1 tablet (40 mg total) by mouth 2 (two) times daily. (Patient not taking: Reported on 03/21/2019), Disp: 60 tablet, Rfl: 1 .  promethazine (PHENERGAN) 12.5 MG tablet, promethazine 12.5 mg tablet  Take 1 tablet 3 times a day by oral route for 30 days., Disp: , Rfl:  .  sertraline (ZOLOFT) 100 MG tablet, Take 1 tablet (100 mg total) by mouth daily. (Patient  taking differently: Take 100 mg by mouth 2 (two) times daily. ), Disp: 90 tablet, Rfl: 3  Review of Systems  Constitutional: Negative for appetite change, chills and fever.  HENT: Positive for rhinorrhea, sneezing and sore throat.   Respiratory: Negative for cough, chest tightness, shortness of breath and wheezing.   Cardiovascular: Negative for chest pain and palpitations.  Gastrointestinal: Negative for abdominal pain, nausea and vomiting.    Social History   Tobacco Use  . Smoking status: Never Smoker  . Smokeless tobacco: Never Used  Substance Use Topics  . Alcohol use: Not Currently    Comment: occasionally     Objective:    Vitals:   05/25/19 0852  Temp: 98.2 F (36.8 C)  There is no height or weight on file to calculate BMI.  WDWN male in no apparent distress.  Head: Normocephalic, atraumatic. Neck: Supple, NROM Respiratory: No apparent distress Psych: Normal mood and affect Nose: Slight redness around nostrils.     Assessment & Plan    1. Allergic rhinitis, unspecified seasonality, unspecified trigger Developed sneezing and rhinorrhea the past few days. Has a history of "hayfever". Recommend he restart the Flonase Nasal Spray hs and add  Claritin 10 mg qd. If fever, shortness of breath, cough, fatigue, loss of taste or smell develops, should notify this office and get COVID testing. Increase fluid intake and continue pandemic restrictions of social distancing, frequent hand washing and wearing a mask.   I discussed the limitations of evaluation and management by telemedicine and the availability of in person appointments. The patient expressed understanding and agreed to proceed.  I discussed the assessment and treatment plan with the patient. The patient was provided an opportunity to ask questions and all were answered. The patient agreed with the plan and demonstrated an understanding of the instructions.   The patient was advised to call back or seek an in-person evaluation if the symptoms worsen or if the condition fails to improve as anticipated.  I provided 15 minutes of non-face-to-face time during this encounter.    Vernie Murders, PA  [facility]

## 2019-06-04 ENCOUNTER — Other Ambulatory Visit: Payer: Self-pay

## 2019-06-04 DIAGNOSIS — G3184 Mild cognitive impairment, so stated: Secondary | ICD-10-CM | POA: Insufficient documentation

## 2019-06-05 ENCOUNTER — Encounter: Payer: Self-pay | Admitting: Gastroenterology

## 2019-06-05 ENCOUNTER — Other Ambulatory Visit: Payer: Self-pay

## 2019-06-05 ENCOUNTER — Telehealth: Payer: Self-pay

## 2019-06-05 ENCOUNTER — Ambulatory Visit (INDEPENDENT_AMBULATORY_CARE_PROVIDER_SITE_OTHER): Payer: Managed Care, Other (non HMO) | Admitting: Gastroenterology

## 2019-06-05 VITALS — BP 166/90 | HR 69 | Temp 98.3°F | Ht 67.0 in | Wt 281.1 lb

## 2019-06-05 DIAGNOSIS — R1310 Dysphagia, unspecified: Secondary | ICD-10-CM | POA: Diagnosis not present

## 2019-06-05 DIAGNOSIS — Z1211 Encounter for screening for malignant neoplasm of colon: Secondary | ICD-10-CM

## 2019-06-05 MED ORDER — NA SULFATE-K SULFATE-MG SULF 17.5-3.13-1.6 GM/177ML PO SOLN: 354.0000 mL | Freq: Once | ORAL | 0 refills | Status: AC

## 2019-06-05 NOTE — Telephone Encounter (Signed)
Copied from [place] [id number]. Topic: General - Other >> Jun 05, 2019  4:23 PM Wynetta Emery, [place] C wrote: Reason for CRM: pt called in to be advised. Pt says that he was seen for cough, congestion, etc. Pt says that he got a little better but have since started back feeling bad. Pt would like to be advised further.   [phone]

## 2019-06-05 NOTE — Progress Notes (Signed)
Cephas Darby, MD [address]  [place]  [place], [place] [address]  Main: [phone]  Fax: [phone]    Gastroenterology Consultation  Referring Provider:     Jerrol Banana.,* Primary Care Physician:  Jerrol Banana., MD Primary Gastroenterologist:  Dr. Cephas Darby Reason for Consultation: Dysphagia        HPI:   Peter [name] is a 58 y.o. male referred by Dr. Rosanna Randy, Retia Passe., MD  for consultation & management of chronic dysphagia.  Patient reports that he has been experiencing difficulty swallowing to solids for several years and particularly to fatty foods.  He tried putting her millimeters in the past and currently on cimetidine.  He denies heartburn, regurgitation.  He had complicated abdominal surgery in 2018 after laparoscopic appendectomy converted to open surgery complicated by abdominal wound evisceration, open abdomin for several weeks, wound debridements, wound VAC placement.  He reported that his length of stay was about 2 and 1/2 months in the hospital.  He has recently undergone knee replacement.  He is morbidly obese, suffering from severe constipation which is now under control.  He denies rectal bleeding, abdominal pain, nausea or vomiting.  NSAIDs: None  Antiplts/Anticoagulants/Anti thrombotics: None  GI Procedures: Underwent colonoscopy 2018, poor prep  Past Medical History:  Diagnosis Date  . Bipolar disorder ([place])   . Blood in stool   . Difficult intubation   . Dyspnea   . GERD (gastroesophageal reflux disease)   . Glaucoma    since 2004  . Hypertension    since 2000  . Obesity, unspecified   . Sleep apnea   . Ulcer 2011   gastric  . Unspecified hemorrhoids without mention of complication     Past Surgical History:  Procedure Laterality Date  . APPLICATION OF WOUND VAC N/A 11/16/2016   Procedure: APPLICATION OF WOUND VAC-ABDOMINAL;  Surgeon: Florene Glen, MD;  Location: [facility];  Service: General;   Laterality: N/A;  . APPLICATION OF WOUND VAC N/A 11/18/2016   Procedure: APPLICATION OF WOUND VAC;  Surgeon: Florene Glen, MD;  Location: [facility];  Service: General;  Laterality: N/A;  change  . APPLICATION OF WOUND VAC N/A 11/23/2016   Procedure: WOUND VAC CHANGE;  Surgeon: Olean Ree, MD;  Location: [facility];  Service: General;  Laterality: N/A;  wound vac application  . CARDIAC CATHETERIZATION Left 06/23/2016   Procedure: Left Heart Cath and Coronary Angiography;  Surgeon: Nelva Bush, MD;  Location: [name];  Service: Cardiovascular;  Laterality: Left;  . CHOLECYSTECTOMY  1992  . COLONOSCOPY W/ POLYPECTOMY  05/22/2010   21mtransverse colon polyp, traditional serrated adenoma,negative for high grade dysplasia & malignancy. rectal polyp-568mnegative for dysplasia & malignancy.  . Marland KitchenRESSING CHANGE UNDER ANESTHESIA N/A 11/21/2016   Procedure: DRESSING CHANGE UNDER ANESTHESIA;  Surgeon: CoFlorene GlenMD;  Location: [facility];  Service: General;  Laterality: N/A;  . KNEE SURGERY Left 2010  . LAPAROSCOPIC APPENDECTOMY N/A 10/30/2016   Procedure: APPENDECTOMY LAPAROSCOPIC changed  to open application of wound vac;  Surgeon: DiJules HusbandsMD;  Location: [facility];  Service: General;  Laterality: N/A;  . LAPAROTOMY N/A 11/11/2016   Procedure: EXPLORATORY LAPAROTOMY, DEBRIDEMENT OF ABDOMINAL WOUND, [place]AL [place] Surgeon: PaJules HusbandsMD;  Location: [facility];  Service: General;  Laterality: N/A;  . LAPAROTOMY N/A 11/12/2016   Procedure: EXPLORATORY LAPAROTOMY;  Surgeon: WoClayburn PertMD;  Location: [facility];  Service: General;  Laterality: N/A;  . LAPAROTOMY N/A 11/14/2016   Procedure: EXPLORATORY LAPAROTOMY; Irrigation, partial closure;  Surgeon: Clayburn Pert, MD;  Location: [facility];  Service: General;  Laterality: N/A;  . TOTAL KNEE ARTHROPLASTY Left 03/21/2019   Procedure: TOTAL KNEE ARTHROPLASTY;  Surgeon: Lovell Sheehan, MD;  Location: [facility];  Service:  Orthopedics;  Laterality: Left;     Current Outpatient Medications:  .  atorvastatin (LIPITOR) 40 MG tablet, Take 1 tablet (40 mg total) by mouth daily. (Patient taking differently: Take 40 mg by mouth daily at 6 PM. ), Disp: 90 tablet, Rfl: 3 .  b complex vitamins capsule, Take 1 capsule by mouth daily., Disp: 30 capsule, Rfl: 11 .  carbamazepine (TEGRETOL) 200 MG tablet, 3 po q am, 2 qhs., Disp: 450 tablet, Rfl: 1 .  celecoxib (CELEBREX) 100 MG capsule, , Disp: , Rfl:  .  cimetidine (TAGAMET) 400 MG tablet, Take 1 tablet (400 mg total) by mouth 2 (two) times daily., Disp: 90 tablet, Rfl: 1 .  eplerenone (INSPRA) 50 MG tablet, Take 50 mg by mouth daily., Disp: , Rfl:  .  hydrALAZINE (APRESOLINE) 100 MG tablet, Take 1 tablet (100 mg total) by mouth 3 (three) times daily., Disp: 270 tablet, Rfl: 3 .  HYDROmorphone (DILAUDID) 2 MG tablet, TK 1 T PO Q 4 H, Disp: , Rfl:  .  lamoTRIgine (LAMICTAL) 200 MG tablet, Take 1 tablet (200 mg total) by mouth at bedtime., Disp: 90 tablet, Rfl: 1 .  latanoprost (XALATAN) 0.005 % ophthalmic solution, Apply to eye., Disp: , Rfl:  .  losartan (COZAAR) 100 MG tablet, Take 1 tablet (100 mg total) by mouth daily., Disp: 90 tablet, Rfl: 3 .  LUMIGAN 0.01 % SOLN, Place 1 drop into both eyes every morning. , Disp: , Rfl: 7 .  methocarbamol (ROBAXIN) 500 MG tablet, Take 1 tablet (500 mg total) by mouth every 6 (six) hours as needed for muscle spasms., Disp: 40 tablet, Rfl: 1 .  metoprolol tartrate (LOPRESSOR) 50 MG tablet, Take 1 tablet (50 mg total) by mouth 2 (two) times daily., Disp: 180 tablet, Rfl: 3 .  NARCAN 4 MG/0.1ML LIQD nasal spray kit, USE 1 SPRAY IEN UTD PRN, Disp: , Rfl:  .  NON FORMULARY, CPAP, Disp: , Rfl:  .  Omega-3 1000 MG CAPS, Take 1,000 mg by mouth 3 (three) times daily. , Disp: , Rfl:  .  omega-3 acid ethyl esters (LOVAZA) 1 g capsule, Take by mouth., Disp: , Rfl:  .  promethazine (PHENERGAN) 12.5 MG tablet, promethazine 12.5 mg tablet  Take 1  tablet 3 times a day by oral route for 30 days., Disp: , Rfl:  .  sertraline (ZOLOFT) 100 MG tablet, Take 1 tablet (100 mg total) by mouth daily. (Patient taking differently: Take 100 mg by mouth 2 (two) times daily. ), Disp: 90 tablet, Rfl: 3 .  Na Sulfate-K Sulfate-Mg Sulf 17.5-3.13-1.6 GM/177ML SOLN, Take 354 mLs by mouth once for 1 dose., Disp: 354 mL, Rfl: 0  Family History  Problem Relation Age of Onset  . Cancer Other        colon  . Hyperlipidemia Mother   . Hypertension Mother      Social History   Tobacco Use  . Smoking status: Never Smoker  . Smokeless tobacco: Never Used  Substance Use Topics  . Alcohol use: Not Currently    Comment: occasionally  . Drug use: No    Allergies as of 06/05/2019 - Review Complete 06/05/2019  Allergen Reaction Noted  . Oxycodone Other (See Comments) 11/27/2016  . Isosorbide  07/28/2016    Review of Systems:    All systems reviewed and negative except where noted in HPI.   Physical Exam:  BP (!) 166/90 (BP Location: Left Arm, Patient Position: Sitting, Cuff Size: Large)   Pulse 69   Temp 98.3 F (36.8 C) (Oral)   Ht _0  (1.702 m)   Wt 281 lb 2 oz (127.5 kg)   BMI 44.03 kg/m  No LMP for male patient.  General:   Alert,  Well-developed, well-nourished, pleasant and cooperative in NAD Head:  Normocephalic and atraumatic. Eyes:  Sclera clear, no icterus.   Conjunctiva pink. Ears:  Normal auditory acuity. Nose:  No deformity, discharge, or lesions. Mouth:  No deformity or lesions,oropharynx pink & moist. Neck:  Supple; no masses or thyromegaly. Lungs:  Respirations even and unlabored.  Clear throughout to auscultation.   No wheezes, crackles, or rhonchi. No acute distress. Heart:  Regular rate and rhythm; no murmurs, clicks, rubs, or gallops. Abdomen:  Normal bowel sounds. Soft, non-tender, palpable phlegmon in the mid upper abdomen measuring about golfball size and non-distended without masses, hepatosplenomegaly or hernias  noted.  No guarding or rebound tenderness.   Rectal: Not performed Msk:  Symmetrical without gross deformities. Good, equal movement & strength bilaterally. Pulses:  Normal pulses noted. Extremities:  No clubbing or edema.  No cyanosis. Neurologic:  Alert and oriented x3;  grossly normal neurologically. Skin:  Intact without significant lesions or rashes. No jaundice. Psych:  Alert and cooperative. Normal mood and affect.  Imaging Studies: Reviewed  Assessment and Plan:   BRAEDIN MILLHOUSE is a 58 y.o. male with morbid obesity, hypertension, status post laparoscopic appendectomy converted to open complicated by abdominal wound evisceration, wound VAC, surgical debridements seen in consultation for chronic dysphagia to solids  Chronic dysphagia Recommend EGD for further evaluation as well as screening for Barrett's Continue cimetidine for now  Colon cancer screening Patient had poor prep in 2018 Recommend repeat colonoscopy and patient is agreeable  I have discussed alternative options, risks & benefits,  which include, but are not limited to, bleeding, infection, perforation,respiratory complication & drug reaction.  The patient agrees with this plan & written consent will be obtained.      Follow up in 2 months   Cephas Darby, MD

## 2019-06-06 NOTE — Telephone Encounter (Signed)
I would go ahead and get a Covid test ordered for this.  Fluids Robitussin and rest otherwise for the cough pending the Covid test

## 2019-06-06 NOTE — Telephone Encounter (Signed)
Patient advised as directed below. 

## 2019-06-06 NOTE — Telephone Encounter (Signed)
Please advise? Patient had video visit with Peter [name] on 05/25/2019.

## 2019-06-15 ENCOUNTER — Other Ambulatory Visit: Payer: Self-pay

## 2019-06-15 ENCOUNTER — Other Ambulatory Visit
Admission: RE | Admit: 2019-06-15 | Discharge: 2019-06-15 | Disposition: A | Discharge status: Home / Self Care | Payer: Managed Care, Other (non HMO) | Source: Ambulatory Visit | Attending: Gastroenterology | Admitting: Gastroenterology

## 2019-06-15 ENCOUNTER — Telehealth: Payer: Self-pay

## 2019-06-15 DIAGNOSIS — Z01812 Encounter for preprocedural laboratory examination: Secondary | ICD-10-CM | POA: Diagnosis not present

## 2019-06-15 DIAGNOSIS — Z20828 Contact with and (suspected) exposure to other viral communicable diseases: Secondary | ICD-10-CM | POA: Diagnosis not present

## 2019-06-15 NOTE — Telephone Encounter (Signed)
Returned patients call regarding keeping his colonoscopy w/EGD as scheduled with Dr. Marius Ditch on 16th.  He states he has a head cold and was wondering if he should keep his procedure as scheduled.  He does not have a fever at this time.  I advised to keep procedures as scheduled and to go ahead and have COVID test as scheduled.  If test should come back positive we will cancel, or if he is not feeling well still prior to his colonoscopy we will allow last minute cancellation without a cancellation fee.  Thanks,  Sharyn Lull

## 2019-06-16 LAB — SARS CORONAVIRUS 2 (TAT 6-24 HRS): SARS Coronavirus 2: NEGATIVE

## 2019-06-18 ENCOUNTER — Telehealth: Payer: Managed Care, Other (non HMO) | Admitting: Physician Assistant

## 2019-06-18 ENCOUNTER — Other Ambulatory Visit: Payer: Self-pay | Admitting: Family Medicine

## 2019-06-18 DIAGNOSIS — J329 Chronic sinusitis, unspecified: Secondary | ICD-10-CM

## 2019-06-18 DIAGNOSIS — R1310 Dysphagia, unspecified: Secondary | ICD-10-CM

## 2019-06-18 MED ORDER — AMOXICILLIN-POT CLAVULANATE 875-125 MG PO TABS: 1.0000 | ORAL_TABLET | Freq: Two times a day (BID) | ORAL | 0 refills | Status: AC

## 2019-06-18 MED ORDER — CIMETIDINE 400 MG PO TABS: 400.0000 mg | ORAL_TABLET | Freq: Two times a day (BID) | ORAL | 1 refills | Status: DC

## 2019-06-18 NOTE — Progress Notes (Signed)
We are sorry that you are not feeling well.  Here is how we plan to help!  Based on what you have shared with me it looks like you have sinusitis.  Sinusitis is inflammation and infection in the sinus cavities of the head.  Based on your presentation I believe you most likely have Acute Bacterial Sinusitis.  This is an infection caused by bacteria and is treated with antibiotics. I have prescribed Augmentin 875mg/125mg one tablet twice daily with food, for 7 days. You may use an oral decongestant such as Mucinex D or if you have glaucoma or high blood pressure use plain Mucinex. Saline nasal spray help and can safely be used as often as needed for congestion.  If you develop worsening sinus pain, fever or notice severe headache and vision changes, or if symptoms are not better after completion of antibiotic, please schedule an appointment with a health care provider.    Sinus infections are not as easily transmitted as other respiratory infection, however we still recommend that you avoid close contact with loved ones, especially the very young and elderly.  Remember to wash your hands thoroughly throughout the day as this is the number one way to prevent the spread of infection!  Home Care:  Only take medications as instructed by your medical team.  Complete the entire course of an antibiotic.  Do not take these medications with alcohol.  A steam or ultrasonic humidifier can help congestion.  You can place a towel over your head and breathe in the steam from hot water coming from a faucet.  Avoid close contacts especially the very young and the elderly.  Cover your mouth when you cough or sneeze.  Always remember to wash your hands.  Get Help Right Away If:  You develop worsening fever or sinus pain.  You develop a severe head ache or visual changes.  Your symptoms persist after you have completed your treatment plan.  Make sure you  Understand these instructions.  Will watch your  condition.  Will get help right away if you are not doing well or get worse.  Your e-visit answers were reviewed by a board certified advanced clinical practitioner to complete your personal care plan.  Depending on the condition, your plan could have included both over the counter or prescription medications.  If there is a problem please reply  once you have received a response from your provider.  Your safety is important to us.  If you have drug allergies check your prescription carefully.    You can use MyChart to ask questions about today's visit, request a non-urgent call back, or ask for a work or school excuse for 24 hours related to this e-Visit. If it has been greater than 24 hours you will need to follow up with your provider, or enter a new e-Visit to address those concerns.  You will get an e-mail in the next two days asking about your experience.  I hope that your e-visit has been valuable and will speed your recovery. Thank you for using e-visits.   Greater than 5 minutes, yet less than 10 minutes of time have been spent researching, coordinating and implementing care for this patient today.   

## 2019-06-18 NOTE — Telephone Encounter (Signed)
[facility] faxed refill request for the following medications:  cimetidine (TAGAMET) 400 MG tablet   Please advise.  Thanks, [employer]

## 2019-06-20 ENCOUNTER — Encounter: Payer: Self-pay | Admitting: Gastroenterology

## 2019-06-20 ENCOUNTER — Encounter
Admission: RE | Disposition: A | Discharge status: Home / Self Care | Payer: Self-pay | Source: Home / Self Care | Attending: Gastroenterology

## 2019-06-20 ENCOUNTER — Other Ambulatory Visit: Payer: Self-pay

## 2019-06-20 ENCOUNTER — Telehealth: Payer: Self-pay

## 2019-06-20 ENCOUNTER — Ambulatory Visit: Payer: Managed Care, Other (non HMO) | Admitting: Anesthesiology

## 2019-06-20 ENCOUNTER — Ambulatory Visit
Admission: RE | Admit: 2019-06-20 | Discharge: 2019-06-20 | Disposition: A | Discharge status: Home / Self Care | Payer: Managed Care, Other (non HMO) | Attending: Gastroenterology | Admitting: Gastroenterology

## 2019-06-20 DIAGNOSIS — K2980 Duodenitis without bleeding: Secondary | ICD-10-CM | POA: Diagnosis not present

## 2019-06-20 DIAGNOSIS — K635 Polyp of colon: Secondary | ICD-10-CM

## 2019-06-20 DIAGNOSIS — Z1211 Encounter for screening for malignant neoplasm of colon: Secondary | ICD-10-CM | POA: Diagnosis not present

## 2019-06-20 DIAGNOSIS — G4730 Sleep apnea, unspecified: Secondary | ICD-10-CM | POA: Insufficient documentation

## 2019-06-20 DIAGNOSIS — Z86010 Personal history of colonic polyps: Secondary | ICD-10-CM | POA: Insufficient documentation

## 2019-06-20 DIAGNOSIS — E669 Obesity, unspecified: Secondary | ICD-10-CM | POA: Insufficient documentation

## 2019-06-20 DIAGNOSIS — K219 Gastro-esophageal reflux disease without esophagitis: Secondary | ICD-10-CM | POA: Insufficient documentation

## 2019-06-20 DIAGNOSIS — H409 Unspecified glaucoma: Secondary | ICD-10-CM | POA: Diagnosis not present

## 2019-06-20 DIAGNOSIS — K3189 Other diseases of stomach and duodenum: Secondary | ICD-10-CM | POA: Insufficient documentation

## 2019-06-20 DIAGNOSIS — R1310 Dysphagia, unspecified: Secondary | ICD-10-CM | POA: Diagnosis present

## 2019-06-20 DIAGNOSIS — D123 Benign neoplasm of transverse colon: Secondary | ICD-10-CM | POA: Insufficient documentation

## 2019-06-20 DIAGNOSIS — Z8249 Family history of ischemic heart disease and other diseases of the circulatory system: Secondary | ICD-10-CM | POA: Insufficient documentation

## 2019-06-20 DIAGNOSIS — Z79899 Other long term (current) drug therapy: Secondary | ICD-10-CM | POA: Diagnosis not present

## 2019-06-20 DIAGNOSIS — Z9889 Other specified postprocedural states: Secondary | ICD-10-CM | POA: Diagnosis not present

## 2019-06-20 DIAGNOSIS — F319 Bipolar disorder, unspecified: Secondary | ICD-10-CM | POA: Insufficient documentation

## 2019-06-20 DIAGNOSIS — D124 Benign neoplasm of descending colon: Secondary | ICD-10-CM | POA: Diagnosis not present

## 2019-06-20 DIAGNOSIS — I10 Essential (primary) hypertension: Secondary | ICD-10-CM | POA: Insufficient documentation

## 2019-06-20 DIAGNOSIS — Z6841 Body Mass Index (BMI) 40.0 and over, adult: Secondary | ICD-10-CM | POA: Insufficient documentation

## 2019-06-20 HISTORY — PX: COLONOSCOPY WITH PROPOFOL: SHX5780

## 2019-06-20 HISTORY — PX: ESOPHAGOGASTRODUODENOSCOPY (EGD) WITH PROPOFOL: SHX5813

## 2019-06-20 SURGERY — COLONOSCOPY WITH PROPOFOL
Anesthesia: General

## 2019-06-20 MED ORDER — MIDAZOLAM HCL 2 MG/2ML IJ SOLN
INTRAMUSCULAR | Status: AC
  Filled 2019-06-20: qty 2

## 2019-06-20 MED ORDER — GOLYTELY 236 G PO SOLR: 4.0000 L | Freq: Once | ORAL | 0 refills | Status: AC

## 2019-06-20 MED ORDER — PROPOFOL 10 MG/ML IV BOLUS
INTRAVENOUS | Status: AC
  Filled 2019-06-20: qty 20

## 2019-06-20 MED ORDER — GLYCOPYRROLATE 0.2 MG/ML IJ SOLN
INTRAMUSCULAR | Status: DC | PRN
  Administered 2019-06-20: .2 mg via INTRAVENOUS

## 2019-06-20 MED ORDER — SODIUM CHLORIDE 0.9 % IV SOLN: INTRAVENOUS | Status: DC

## 2019-06-20 MED ORDER — GLYCOPYRROLATE 0.2 MG/ML IJ SOLN
INTRAMUSCULAR | Status: AC
  Filled 2019-06-20: qty 1

## 2019-06-20 MED ORDER — GOLYTELY 236 G PO SOLR: 4.0000 L | Freq: Once | ORAL | 0 refills | Status: DC

## 2019-06-20 MED ORDER — LIDOCAINE HCL (PF) 2 % IJ SOLN
INTRAMUSCULAR | Status: AC
  Filled 2019-06-20: qty 10

## 2019-06-20 MED ORDER — PROPOFOL 500 MG/50ML IV EMUL
INTRAVENOUS | Status: AC
  Filled 2019-06-20: qty 100

## 2019-06-20 MED ORDER — MIDAZOLAM HCL 5 MG/5ML IJ SOLN
INTRAMUSCULAR | Status: DC | PRN
  Administered 2019-06-20: 2 mg via INTRAVENOUS

## 2019-06-20 MED ORDER — PROPOFOL 10 MG/ML IV BOLUS
INTRAVENOUS | Status: DC | PRN
  Administered 2019-06-20: 70 mg via INTRAVENOUS
  Administered 2019-06-20: 50 mg via INTRAVENOUS
  Administered 2019-06-20: 30 mg via INTRAVENOUS
  Administered 2019-06-20: 50 mg via INTRAVENOUS

## 2019-06-20 MED ORDER — LIDOCAINE 2% (20 MG/ML) 5 ML SYRINGE
INTRAMUSCULAR | Status: DC | PRN
  Administered 2019-06-20: 20 mg via INTRAVENOUS

## 2019-06-20 MED ORDER — PROPOFOL 500 MG/50ML IV EMUL
INTRAVENOUS | Status: DC | PRN
  Administered 2019-06-20: 120 ug/kg/min via INTRAVENOUS

## 2019-06-20 NOTE — Telephone Encounter (Signed)
LVM for pt to let him know another bowel prep has been sent to pharmacy for him.  He has been asked to continue clear liquid diet and begin Golytely bowel prep at 5 pm this evening-drink 8 oz every 30 mins until completed.  Thanks Buell Parcel

## 2019-06-20 NOTE — Op Note (Signed)
[facility] Gastroenterology Patient Name: Peter [name] Procedure Date: 06/20/2019 7:53 AM MRN: [id number] Account #: [id number] Date of Birth: [birth year] Admit Type: Outpatient Age: 58 Room: [facility] ENDO ROOM 3 Gender: Male Note Status: Finalized Procedure:             Upper GI endoscopy Indications:           Dysphagia Providers:             Lin Landsman MD, MD Referring MD:          Janine Ores. Rosanna Randy, MD (Referring MD) Medicines:             Monitored Anesthesia Care Complications:         No immediate complications. Estimated blood loss: None. Procedure:             Pre-Anesthesia Assessment:                        - Prior to the procedure, a History and Physical was                         performed, and patient medications and allergies were                         reviewed. The patient is competent. The risks and                         benefits of the procedure and the sedation options and                         risks were discussed with the patient. All questions                         were answered and informed consent was obtained.                         Patient identification and proposed procedure were                         verified by the physician, the nurse, the                         anesthesiologist, the anesthetist and the technician                         in the pre-procedure area in the procedure room in the                         endoscopy suite. Mental Status Examination: alert and                         oriented. Airway Examination: normal oropharyngeal                         airway and neck mobility. Respiratory Examination:                         clear to auscultation. CV Examination: normal.  Prophylactic Antibiotics: The patient does not require                         prophylactic antibiotics. Prior Anticoagulants: The                         patient has taken no previous anticoagulant or                    antiplatelet agents. ASA Grade Assessment: III - A                         patient with severe systemic disease. After reviewing                         the risks and benefits, the patient was deemed in                         satisfactory condition to undergo the procedure. The                         anesthesia plan was to use monitored anesthesia care                         (MAC). Immediately prior to administration of                         medications, the patient was re-assessed for adequacy                         to receive sedatives. The heart rate, respiratory                         rate, oxygen saturations, blood pressure, adequacy of                         pulmonary ventilation, and response to care were                         monitored throughout the procedure. The physical                         status of the patient was re-assessed after the                         procedure.                        After obtaining informed consent, the endoscope was                         passed under direct vision. Throughout the procedure,                         the patient's blood pressure, pulse, and oxygen                         saturations were monitored continuously. The Endoscope  was introduced through the mouth, and advanced to the                         second part of duodenum. The upper GI endoscopy was                         accomplished without difficulty. The patient tolerated                         the procedure well. Findings:      Localized mucosal changes characterized by congestion, polypoid       appearing were found in the second portion of the duodenum. Biopsies       were taken with a cold forceps for histology.      There was evidence of a closed previous gastrostomy present in the       gastric body. This was characterized by healthy appearing mucosa.      Esophagogastric landmarks were identified: the  gastroesophageal junction       was found at 38 cm from the incisors.      The gastroesophageal junction was normal.      The gastroesophageal junction and examined esophagus were normal.       Biopsies were taken with a cold forceps for histology. Impression:            - Mucosal changes in the duodenum. Biopsied.                        - Closed previous gastrostomy present characterized by                         healthy appearing mucosa.                        - Esophagogastric landmarks identified.                        - Normal gastroesophageal junction.                        - Normal gastroesophageal junction and esophagus.                         Biopsied. Recommendation:        - Await pathology results.                        - Follow an antireflux regimen.                        - Continue present medications.                        - Proceed with colonoscopy as scheduled                        See colonoscopy report Procedure Code(s):     --- Professional ---                        [phone], Esophagogastroduodenoscopy, flexible,  transoral; with biopsy, single or multiple Diagnosis Code(s):     --- Professional ---                        K31.89, Other diseases of stomach and duodenum                        Z98.890, Other specified postprocedural states                        R13.10, Dysphagia, unspecified CPT copyright 2019 [employer]. All rights reserved. The codes documented in this report are preliminary and upon coder review may  be revised to meet current compliance requirements. Dr. Ulyess Mort Lin Landsman MD, MD 06/20/2019 9:22:57 AM This report has been signed electronically. Number of Addenda: 0 Note Initiated On: 06/20/2019 7:53 AM Estimated Blood Loss:  Estimated blood loss: none.      [facility]

## 2019-06-20 NOTE — Op Note (Signed)
[facility]enter[facility] Patient Name: Peter [name] Procedure Date: 06/20/2019 7:53 AM MRN: [id number] Account #: [id number] Date of Birth: 01/13/1961 Admit Type: Outpatient Age: 58 Room: [facility] ENDO ROOM 3 Gender: Male Note Status: Finalized Procedure:             Colonoscopy Indications:           Screening for colorectal malignant neoplasm, Last                         colonoscopy: November 2011 Providers:             Lin Landsman MD, MD Medicines:             Monitored Anesthesia Care Complications:         No immediate complications. Estimated blood loss: None. Procedure:             Pre-Anesthesia Assessment:                        - Prior to the procedure, a History and Physical was                         performed, and patient medications and allergies were                         reviewed. The patient is competent. The risks and                         benefits of the procedure and the sedation options and                         risks were discussed with the patient. All questions                         were answered and informed consent was obtained.                         Patient identification and proposed procedure were                         verified by the physician, the nurse, the                         anesthesiologist, the anesthetist and the technician                         in the pre-procedure area in the procedure room in the                         endoscopy suite. Mental Status Examination: alert and                         oriented. Airway Examination: small/crowded                         oropharyngeal airway and Mallampati Class III (part of                         the uvula and soft palate visualized). Respiratory  Examination: clear to auscultation. CV Examination:                         normal. Prophylactic Antibiotics: The patient does not                         require prophylactic  antibiotics. Prior                         Anticoagulants: The patient has taken no previous                         anticoagulant or antiplatelet agents. ASA Grade                         Assessment: III - A patient with severe systemic                         disease. After reviewing the risks and benefits, the                         patient was deemed in satisfactory condition to                         undergo the procedure. The anesthesia plan was to use                         monitored anesthesia care (MAC). Immediately prior to                         administration of medications, the patient was                         re-assessed for adequacy to receive sedatives. The                         heart rate, respiratory rate, oxygen saturations,                         blood pressure, adequacy of pulmonary ventilation, and                         response to care were monitored throughout the                         procedure. The physical status of the patient was                         re-assessed after the procedure.                        After obtaining informed consent, the colonoscope was                         passed under direct vision. Throughout the procedure,                         the patient's blood pressure, pulse, and oxygen  saturations were monitored continuously. The                         Colonoscope was introduced through the anus and                         advanced to the the cecum, identified by appendiceal                         orifice and ileocecal valve. The colonoscopy was                         performed without difficulty. The patient tolerated                         the procedure well. The quality of the bowel                         preparation was evaluated using the BBPS [facility] Bowel                         Preparation Scale) with scores of: Right Colon = 1                         (portion of mucosa seen, but other  areas not well seen                         due to staining, residual stool and/or opaque liquid),                         Transverse Colon = 2 (minor amount of residual                         staining, small fragments of stool and/or opaque                         liquid, but mucosa seen well) and Left Colon = 2                         (minor amount of residual staining, small fragments of                         stool and/or opaque liquid, but mucosa seen well). The                         total BBPS score equals 5. The quality of the bowel                         preparation was inadequate. Findings:      Two sessile polyps were found in the descending colon and transverse       colon. The polyps were 5 to 8 mm in size. These polyps were removed with       a cold snare. Resection and retrieval were complete.      Copious quantities of semi-solid stool was found in the entire colon,       precluding visualization.      The retroflexed view  of the distal rectum and anal verge was normal and       showed no anal or rectal abnormalities. Impression:            - Preparation of the colon was inadequate.                        - Two 5 to 8 mm polyps in the descending colon and in                         the transverse colon, removed with a cold snare.                         Resected and retrieved.                        - Stool in the entire examined colon.                        - The distal rectum and anal verge are normal on                         retroflexion view. Recommendation:        - Discharge patient to home (with spouse).                        - Clear liquid diet today.                        - Continue present medications.                        - Await pathology results.                        - Repeat colonoscopy tomorrow or in 30month with 2 day                         prep because the bowel preparation was poor. Procedure Code(s):     --- Professional ---                         [id number] Colonoscopy, flexible; with removal of                         tumor(s), polyp(s), or other lesion(s) by snare                         technique Diagnosis Code(s):     --- Professional ---                        Z12.11, Encounter for screening for malignant neoplasm                         of colon                        K63.5, Polyp of colon CPT copyright 2019 American Medical Association. All rights reserved. The codes documented in this report are preliminary and upon coder review may  be revised to meet  current compliance requirements. Dr. Ulyess Mort Lin Landsman MD, MD 06/20/2019 9:44:57 AM This report has been signed electronically. Number of Addenda: 0 Note Initiated On: 06/20/2019 7:53 AM Scope Withdrawal Time: 0 hours 13 minutes 19 seconds  Total Procedure Duration: 0 hours 16 minutes 29 seconds  Estimated Blood Loss:  Estimated blood loss: none.      [facility]

## 2019-06-20 NOTE — H&P (Signed)
Peter Darby, MD [address]  [place]  [place],  [address]  Main: [phone]  Fax: [phone] Pager: [phone]  Primary Care Physician:  Jerrol Banana., MD Primary Gastroenterologist:  Dr. Cephas [name]  Pre-Procedure History & Physical: HPI:  Peter [name] is a 58 y.o. male is here for an endoscopy and colonoscopy.   Past Medical History:  Diagnosis Date  . Bipolar disorder ([place])   . Blood in stool   . Difficult intubation   . Dyspnea   . GERD (gastroesophageal reflux disease)   . Glaucoma    since 2004  . Hypertension    since 2000  . Obesity, unspecified   . Sleep apnea   . Ulcer 2011   gastric  . Unspecified hemorrhoids without mention of complication     Past Surgical History:  Procedure Laterality Date  . APPLICATION OF WOUND VAC N/A 11/16/2016   Procedure: APPLICATION OF WOUND VAC-ABDOMINAL;  Surgeon: Florene Glen, MD;  Location: [facility] ORS;  Service: General;  Laterality: N/A;  . APPLICATION OF WOUND VAC N/A 11/18/2016   Procedure: APPLICATION OF WOUND VAC;  Surgeon: Florene Glen, MD;  Location: [facility] ORS;  Service: General;  Laterality: N/A;  change  . APPLICATION OF WOUND VAC N/A 11/23/2016   Procedure: WOUND VAC CHANGE;  Surgeon: Olean Ree, MD;  Location: [facility] ORS;  Service: General;  Laterality: N/A;  wound vac application  . CARDIAC CATHETERIZATION Left 06/23/2016   Procedure: Left Heart Cath and Coronary Angiography;  Surgeon: Nelva Bush, MD;  Location: [facility];  Service: Cardiovascular;  Laterality: Left;  . CHOLECYSTECTOMY  1992  . COLONOSCOPY W/ POLYPECTOMY  05/22/2010   74mtransverse colon polyp, traditional serrated adenoma,negative for high grade dysplasia & malignancy. rectal polyp-561mnegative for dysplasia & malignancy.  . Marland KitchenRESSING CHANGE UNDER ANESTHESIA N/A 11/21/2016   Procedure: DRESSING CHANGE UNDER ANESTHESIA;  Surgeon: CoFlorene GlenMD;  Location: [facility] ORS;  Service: General;   Laterality: N/A;  . KNEE SURGERY Left 2010  . LAPAROSCOPIC APPENDECTOMY N/A 10/30/2016   Procedure: APPENDECTOMY LAPAROSCOPIC changed  to open application of wound vac;  Surgeon: DiJules HusbandsMD;  Location: [facility] ORS;  Service: General;  Laterality: N/A;  . LAPAROTOMY N/A 11/11/2016   Procedure: EXPLORATORY LAPAROTOMY, DEBRIDEMENT OF ABDOMINAL WOUND, [place]AL [place] Surgeon: PaJules HusbandsMD;  Location: [facility] ORS;  Service: General;  Laterality: N/A;  . LAPAROTOMY N/A 11/12/2016   Procedure: EXPLORATORY LAPAROTOMY;  Surgeon: WoClayburn PertMD;  Location: [facility] ORS;  Service: General;  Laterality: N/A;  . LAPAROTOMY N/A 11/14/2016   Procedure: EXPLORATORY LAPAROTOMY; Irrigation, partial closure;  Surgeon: WoClayburn PertMD;  Location: [facility] ORS;  Service: General;  Laterality: N/A;  . TOTAL KNEE ARTHROPLASTY Left 03/21/2019   Procedure: TOTAL KNEE ARTHROPLASTY;  Surgeon: BoLovell SheehanMD;  Location: [facility] ORS;  Service: Orthopedics;  Laterality: Left;    Prior to Admission medications   Medication Sig Start Date End Date Taking? Authorizing Provider  amoxicillin-clavulanate (AUGMENTIN) 875-125 MG tablet Take 1 tablet by mouth 2 (two) times daily for 7 days. 06/18/19 06/25/19 Yes McVey, ElGelene MinkPA-C  atorvastatin (LIPITOR) 40 MG tablet Take 1 tablet (40 mg total) by mouth daily. Patient taking differently: Take 40 mg by mouth daily at 6 PM.  01/29/19  Yes GiJerrol Banana MD  b complex vitamins capsule Take 1 capsule by mouth daily. 04/24/19  Yes HuDonnal Moat, PA-C  carbamazepine (TEGRETOL) 200  MG tablet 3 po q am, 2 qhs. 04/24/19  Yes Hurst, Teresa T, PA-C  cimetidine (TAGAMET) 400 MG tablet Take 1 tablet (400 mg total) by mouth 2 (two) times daily. 06/18/19  Yes Jerrol Banana., MD  eplerenone (INSPRA) 50 MG tablet Take 50 mg by mouth daily.   Yes [provider]  hydrALAZINE (APRESOLINE) 100 MG tablet Take 1 tablet (100 mg total) by mouth 3 (three)  times daily. 01/29/19  Yes Jerrol Banana., MD  lamoTRIgine (LAMICTAL) 200 MG tablet Take 1 tablet (200 mg total) by mouth at bedtime. 04/24/19  Yes Hurst, Teresa T, PA-C  latanoprost (XALATAN) 0.005 % ophthalmic solution Apply to eye.   Yes [provider]  losartan (COZAAR) 100 MG tablet Take 1 tablet (100 mg total) by mouth daily. 01/29/19  Yes Jerrol Banana., MD  LUMIGAN 0.01 % SOLN Place 1 drop into both eyes every morning.  08/19/17  Yes [provider]  metoprolol tartrate (LOPRESSOR) 50 MG tablet Take 1 tablet (50 mg total) by mouth 2 (two) times daily. 01/29/19  Yes Jerrol Banana., MD  sertraline (ZOLOFT) 100 MG tablet Take 1 tablet (100 mg total) by mouth daily. Patient taking differently: Take 100 mg by mouth 2 (two) times daily.  01/29/19  Yes Jerrol Banana., MD  [facility] 4 MG/0.1ML LIQD nasal spray kit USE 1 SPRAY IEN UTD PRN 04/05/19   [provider]  NON FORMULARY CPAP    [provider]    Allergies as of 06/05/2019 - Review Complete 06/05/2019  Allergen Reaction Noted  . Oxycodone Other (See Comments) 11/27/2016  . Isosorbide  07/28/2016    Family History  Problem Relation Age of Onset  . Cancer Other        colon  . Hyperlipidemia Mother   . Hypertension Mother     Social History   Socioeconomic History  . Marital status: Married    Spouse name: Not on file  . Number of children: Not on file  . Years of education: Not on file  . Highest education level: Not on file  Occupational History  . Not on file  Tobacco Use  . Smoking status: Never Smoker  . Smokeless tobacco: Never Used  Substance and Sexual Activity  . Alcohol use: Not Currently    Comment: occasionally  . Drug use: No  . Sexual activity: Not on file  Other Topics Concern  . Not on file  Social History Narrative  . Not on file   Social Determinants of Health   Financial Resource Strain:   . Difficulty of Paying Living Expenses:  Not on file  Food Insecurity:   . Worried About Charity fundraiser in the Last Year: Not on file  . Ran Out of Food in the Last Year: Not on file  Transportation Needs:   . Lack of Transportation (Medical): Not on file  . Lack of Transportation (Non-Medical): Not on file  Physical Activity:   . Days of Exercise per Week: Not on file  . Minutes of Exercise per Session: Not on file  Stress:   . Feeling of Stress : Not on file  Social Connections:   . Frequency of Communication with Friends and Family: Not on file  . Frequency of Social Gatherings with Friends and Family: Not on file  . Attends Religious Services: Not on file  . Active Member of Clubs or Organizations: Not on file  . Attends  Club or Organization Meetings: Not on file  . Marital Status: Not on file  Intimate Partner Violence:   . Fear of Current or Ex-Partner: Not on file  . Emotionally Abused: Not on file  . Physically Abused: Not on file  . Sexually Abused: Not on file    Review of Systems: See HPI, otherwise negative ROS  Physical Exam: BP (!) 175/112   Pulse 87   Temp (!) 96.9 F (36.1 C) (Temporal)   Resp 18   Ht _0  (1.702 m)   Wt 129.3 kg   SpO2 96%   BMI 44.64 kg/m  General:   Alert,  pleasant and cooperative in NAD Head:  Normocephalic and atraumatic. Neck:  Supple; no masses or thyromegaly. Lungs:  Clear throughout to auscultation.    Heart:  Regular rate and rhythm. Abdomen:  Soft, nontender and nondistended. Normal bowel sounds, without guarding, and without rebound.   Neurologic:  Alert and  oriented x4;  grossly normal neurologically.  Impression/Plan: Peter [name] is here for an endoscopy and colonoscopy to be performed for dysphagia and colon cancer screening  Risks, benefits, limitations, and alternatives regarding  endoscopy and colonoscopy have been reviewed with the patient.  Questions have been answered.  All parties agreeable.   Sherri Sear, MD  06/20/2019, 9:01 AM

## 2019-06-20 NOTE — Transfer of Care (Signed)
Immediate Anesthesia Transfer of Care Note  Patient: Peter [name]  Procedure(s) Performed: COLONOSCOPY WITH PROPOFOL (N/A ) ESOPHAGOGASTRODUODENOSCOPY (EGD) WITH PROPOFOL (N/A )  Patient Location: Endoscopy Unit  Anesthesia Type:General  Level of Consciousness: awake  Airway & Oxygen Therapy: Patient Spontanous Breathing and Patient connected to face mask oxygen  Post-op Assessment: Report given to RN and Post -op Vital signs reviewed and stable  Post vital signs: Reviewed  Last Vitals:  Vitals Value Taken Time  BP 135/99 06/20/19 0945  Temp 36.2 C 06/20/19 0945  Pulse 96 06/20/19 0946  Resp 27 06/20/19 0946  SpO2 98 % 06/20/19 0946  Vitals shown include unvalidated device data.  Last Pain:  Vitals:   06/20/19 0807  TempSrc: Temporal  PainSc: 0-No pain         Complications: No apparent anesthesia complications

## 2019-06-20 NOTE — Anesthesia Post-op Follow-up Note (Signed)
Anesthesia QCDR form completed.        

## 2019-06-20 NOTE — Anesthesia Preprocedure Evaluation (Signed)
Anesthesia Evaluation  Patient identified by MRN, date of birth, ID band Patient awake    Reviewed: Allergy & Precautions, H&P , NPO status , Patient's Chart, lab work & pertinent test results, reviewed documented beta blocker date and time   Airway Mallampati: II   Neck ROM: full    Dental  (+) Poor Dentition   Pulmonary shortness of breath and with exertion, sleep apnea and Continuous Positive Airway Pressure Ventilation ,    Pulmonary exam normal        Cardiovascular Exercise Tolerance: Poor hypertension, On Medications negative cardio ROS Normal cardiovascular exam Rhythm:regular Rate:Normal     Neuro/Psych PSYCHIATRIC DISORDERS Depression Bipolar Disorder negative neurological ROS     GI/Hepatic Neg liver ROS, GERD  Medicated,  Endo/Other  Morbid obesity  Renal/GU negative Renal ROS  negative genitourinary   Musculoskeletal   Abdominal   Peds  Hematology negative hematology ROS (+)   Anesthesia Other Findings Past Medical History: No date: Bipolar disorder ([place]) No date: Blood in stool No date: Difficult intubation No date: Dyspnea No date: GERD (gastroesophageal reflux disease) No date: Glaucoma     Comment:  since 2004 No date: Hypertension     Comment:  since 2000 No date: Obesity, unspecified No date: Sleep apnea 2011: Ulcer     Comment:  gastric No date: Unspecified hemorrhoids without mention of complication Past Surgical History: [id number]: APPLICATION OF WOUND VAC; N/A     Comment:  Procedure: APPLICATION OF WOUND VAC-ABDOMINAL;  Surgeon:              Florene Glen, MD;  Location: [facility] ORS;  Service:               General;  Laterality: N/A; 0000000: APPLICATION OF WOUND VAC; N/A     Comment:  Procedure: APPLICATION OF WOUND VAC;  Surgeon: Florene Glen, MD;  Location: [facility] ORS;  Service: General;                Laterality: N/A;  change [id number]: APPLICATION OF  WOUND VAC; N/A     Comment:  Procedure: WOUND VAC CHANGE;  Surgeon: Olean Ree,               MD;  Location: [facility] ORS;  Service: General;  Laterality:               N/A;  wound vac application [id number]: CARDIAC CATHETERIZATION; Left     Comment:  Procedure: Left Heart Cath and Coronary Angiography;                Surgeon: Nelva Bush, MD;  Location: [place] [facility]              [facility];  Service: Cardiovascular;  Laterality: Left; 1992: CHOLECYSTECTOMY 05/22/2010: COLONOSCOPY W/ POLYPECTOMY     Comment:  63mmtransverse colon polyp, traditional serrated               adenoma,negative for high grade dysplasia & malignancy.               rectal polyp-35mm,negative for dysplasia & malignancy. 11/21/2016: DRESSING CHANGE UNDER ANESTHESIA; N/A     Comment:  Procedure: DRESSING CHANGE UNDER ANESTHESIA;  Surgeon:               Florene Glen, MD;  Location: [facility] OR[facility];  Service:  General;  Laterality: N/A; 2010: KNEE SURGERY; Left 10/30/2016: LAPAROSCOPIC APPENDECTOMY; N/A     Comment:  Procedure: APPENDECTOMY LAPAROSCOPIC changed  to open               application of wound vac;  Surgeon: [name], MD;                Location: [facility] OR[facility];  Service: General;  Laterality: N/A; 11/11/2016: LAPAROTOMY; N/A     Comment:  Procedure: EXPLORATORY LAPAROTOMY, DEBRIDEMENT OF               ABDOMINAL WOUND, ABDOMINAL [place];  Surgeon: [name], MD;  Location: [facility] OR[facility];  Service: General;                Laterality: N/A; 11/12/2016: LAPAROTOMY; N/A     Comment:  Procedure: EXPLORATORY LAPAROTOMY;  Surgeon: Clayburn Pert, MD;  Location: [facility] OR[facility];  Service: General;                Laterality: N/A; 11/14/2016: LAPAROTOMY; N/A     Comment:  Procedure: EXPLORATORY LAPAROTOMY; Irrigation, partial               closure;  Surgeon: Clayburn Pert, MD;  Location: [facility]               ORS;  Service: General;  Laterality: N/A; 03/21/2019: TOTAL KNEE  ARTHROPLASTY; Left     Comment:  Procedure: TOTAL KNEE ARTHROPLASTY;  Surgeon: Lovell Sheehan, MD;  Location: [facility] ORS;  Service: Orthopedics;               Laterality: Left;   Reproductive/Obstetrics negative OB ROS                             Anesthesia Physical Anesthesia Plan  ASA: III  Anesthesia Plan: General   Post-op Pain Management:    Induction:   PONV Risk Score and Plan:   Airway Management Planned:   Additional Equipment:   Intra-op Plan:   Post-operative Plan:   Informed Consent: I have reviewed the patients History and Physical, chart, labs and discussed the procedure including the risks, benefits and alternatives for the proposed anesthesia with the patient or authorized representative who has indicated his/her understanding and acceptance.     Dental Advisory Given  Plan Discussed with: CRNA  Anesthesia Plan Comments:         Anesthesia Quick Evaluation

## 2019-06-21 ENCOUNTER — Other Ambulatory Visit: Payer: Self-pay

## 2019-06-21 ENCOUNTER — Encounter: Payer: Self-pay | Admitting: Gastroenterology

## 2019-06-21 ENCOUNTER — Ambulatory Visit: Payer: Managed Care, Other (non HMO) | Admitting: Certified Registered"

## 2019-06-21 ENCOUNTER — Ambulatory Visit
Admission: RE | Admit: 2019-06-21 | Discharge: 2019-06-21 | Disposition: A | Discharge status: Home / Self Care | Payer: Managed Care, Other (non HMO) | Attending: Gastroenterology | Admitting: Gastroenterology

## 2019-06-21 ENCOUNTER — Encounter
Admission: RE | Disposition: A | Discharge status: Home / Self Care | Payer: Self-pay | Source: Home / Self Care | Attending: Gastroenterology

## 2019-06-21 DIAGNOSIS — F319 Bipolar disorder, unspecified: Secondary | ICD-10-CM | POA: Insufficient documentation

## 2019-06-21 DIAGNOSIS — G4730 Sleep apnea, unspecified: Secondary | ICD-10-CM | POA: Diagnosis not present

## 2019-06-21 DIAGNOSIS — R0600 Dyspnea, unspecified: Secondary | ICD-10-CM | POA: Insufficient documentation

## 2019-06-21 DIAGNOSIS — Z8711 Personal history of peptic ulcer disease: Secondary | ICD-10-CM | POA: Insufficient documentation

## 2019-06-21 DIAGNOSIS — Z885 Allergy status to narcotic agent status: Secondary | ICD-10-CM | POA: Insufficient documentation

## 2019-06-21 DIAGNOSIS — Z800 Family history of malignant neoplasm of digestive organs: Secondary | ICD-10-CM | POA: Insufficient documentation

## 2019-06-21 DIAGNOSIS — Z8249 Family history of ischemic heart disease and other diseases of the circulatory system: Secondary | ICD-10-CM | POA: Diagnosis not present

## 2019-06-21 DIAGNOSIS — K5730 Diverticulosis of large intestine without perforation or abscess without bleeding: Secondary | ICD-10-CM | POA: Insufficient documentation

## 2019-06-21 DIAGNOSIS — Z96652 Presence of left artificial knee joint: Secondary | ICD-10-CM | POA: Diagnosis not present

## 2019-06-21 DIAGNOSIS — E669 Obesity, unspecified: Secondary | ICD-10-CM | POA: Diagnosis not present

## 2019-06-21 DIAGNOSIS — H409 Unspecified glaucoma: Secondary | ICD-10-CM | POA: Diagnosis not present

## 2019-06-21 DIAGNOSIS — Z79899 Other long term (current) drug therapy: Secondary | ICD-10-CM | POA: Insufficient documentation

## 2019-06-21 DIAGNOSIS — K219 Gastro-esophageal reflux disease without esophagitis: Secondary | ICD-10-CM | POA: Diagnosis not present

## 2019-06-21 DIAGNOSIS — Z6841 Body Mass Index (BMI) 40.0 and over, adult: Secondary | ICD-10-CM | POA: Insufficient documentation

## 2019-06-21 DIAGNOSIS — Z1211 Encounter for screening for malignant neoplasm of colon: Secondary | ICD-10-CM | POA: Diagnosis not present

## 2019-06-21 DIAGNOSIS — Z888 Allergy status to other drugs, medicaments and biological substances status: Secondary | ICD-10-CM | POA: Insufficient documentation

## 2019-06-21 DIAGNOSIS — Z9049 Acquired absence of other specified parts of digestive tract: Secondary | ICD-10-CM | POA: Diagnosis not present

## 2019-06-21 DIAGNOSIS — I10 Essential (primary) hypertension: Secondary | ICD-10-CM | POA: Insufficient documentation

## 2019-06-21 HISTORY — PX: COLONOSCOPY WITH PROPOFOL: SHX5780

## 2019-06-21 LAB — SURGICAL PATHOLOGY

## 2019-06-21 SURGERY — COLONOSCOPY WITH PROPOFOL
Anesthesia: General

## 2019-06-21 MED ORDER — PROPOFOL 500 MG/50ML IV EMUL
INTRAVENOUS | Status: DC | PRN
  Administered 2019-06-21: 140 ug/kg/min via INTRAVENOUS

## 2019-06-21 MED ORDER — SODIUM CHLORIDE 0.9 % IV SOLN: INTRAVENOUS | Status: DC

## 2019-06-21 MED ORDER — PROPOFOL 10 MG/ML IV BOLUS
INTRAVENOUS | Status: DC | PRN
  Administered 2019-06-21: 50 mg via INTRAVENOUS

## 2019-06-21 NOTE — Op Note (Signed)
[facility] Gastroenterology Patient Name: Peter [name] Procedure Date: 06/21/2019 9:55 AM MRN: [id number] Account #: [id number] Date of Birth: 1961-06-30 Admit Type: Outpatient Age: 58 Room: [facility] ENDO ROOM 4 Gender: Male Note Status: Finalized Procedure:             Colonoscopy Indications:           Screening for colorectal malignant neoplasm,                         inadequate bowel prep on last colonoscopy (more recent                         than 10 years ago), Last colonoscopy: December 2020 Providers:             Lin Landsman MD, MD Referring MD:          Janine Ores. Rosanna Randy, MD (Referring MD) Medicines:             Monitored Anesthesia Care Complications:         No immediate complications. Estimated blood loss: None. Procedure:             Pre-Anesthesia Assessment:                        - Prior to the procedure, a History and Physical was                         performed, and patient medications and allergies were                         reviewed. The patient is competent. The risks and                         benefits of the procedure and the sedation options and                         risks were discussed with the patient. All questions                         were answered and informed consent was obtained.                         Patient identification and proposed procedure were                         verified by the physician, the nurse, the                         anesthesiologist, the anesthetist and the technician                         in the pre-procedure area in the procedure room in the                         endoscopy suite. Mental Status Examination: alert and                         oriented. Airway Examination: normal oropharyngeal  airway and neck mobility. Respiratory Examination:                         clear to auscultation. CV Examination: normal.                         Prophylactic Antibiotics:  The patient does not require                         prophylactic antibiotics. Prior Anticoagulants: The                         patient has taken no previous anticoagulant or                         antiplatelet agents. ASA Grade Assessment: III - A                         patient with severe systemic disease. After reviewing                         the risks and benefits, the patient was deemed in                         satisfactory condition to undergo the procedure. The                         anesthesia plan was to use monitored anesthesia care                         (MAC). Immediately prior to administration of                         medications, the patient was re-assessed for adequacy                         to receive sedatives. The heart rate, respiratory                         rate, oxygen saturations, blood pressure, adequacy of                         pulmonary ventilation, and response to care were                         monitored throughout the procedure. The physical                         status of the patient was re-assessed after the                         procedure.                        After obtaining informed consent, the colonoscope was                         passed under direct vision. Throughout the procedure,  the patient's blood pressure, pulse, and oxygen                         saturations were monitored continuously. The                         Colonoscope was introduced through the anus and                         advanced to the the cecum, identified by appendiceal                         orifice and ileocecal valve. The patient tolerated the                         procedure well. The colonoscopy was performed with                         moderate difficulty due to inadequate bowel prep and                         significant looping. Successful completion of the                         procedure was aided by applying  abdominal pressure.                         The patient tolerated the procedure well. The quality                         of the bowel preparation was evaluated using the BBPS                         [facility] Bowel Preparation Scale) with scores of: Right                         Colon = 2 (minor amount of residual staining, small                         fragments of stool and/or opaque liquid, but mucosa                         seen well), Transverse Colon = 2 (minor amount of                         residual staining, small fragments of stool and/or                         opaque liquid, but mucosa seen well) and Left Colon =                         2 (minor amount of residual staining, small fragments                         of stool and/or opaque liquid, but mucosa seen well).  The total BBPS score equals 6. Findings:      The perianal and digital rectal examinations were normal. Pertinent       negatives include normal sphincter tone and no palpable rectal lesions.      Multiple diverticula were found in the sigmoid colon.      A moderate amount of liquid semi-liquid stool was found in the entire       colon, precluding visualization. Lavage of the area was performed using       50 - 200 mL of sterile water, resulting in clearance with good       visualization, small polyps could be missed. Impression:            - Diverticulosis in the sigmoid colon.                        - Stool in the entire examined colon.                        - No specimens collected. Recommendation:        - Discharge patient to home (with spouse).                        - Resume previous diet today.                        - Continue present medications.                        - Repeat colonoscopy in 3 years with 2 day prep for                         surveillance due to suboptimal prep. Procedure Code(s):     --- Professional ---                        Z6109, Colorectal cancer  screening; colonoscopy on                         individual not meeting criteria for high risk Diagnosis Code(s):     --- Professional ---                        Z12.11, Encounter for screening for malignant neoplasm                         of colon                        K57.30, Diverticulosis of large intestine without                         perforation or abscess without bleeding CPT copyright 2019 American Medical Association. All rights reserved. The codes documented in this report are preliminary and upon coder review may  be revised to meet current compliance requirements. Dr. Ulyess Mort Lin Landsman MD, MD 06/21/2019 10:39:49 AM This report has been signed electronically. Number of Addenda: 0 Note Initiated On: 06/21/2019 9:55 AM Scope Withdrawal Time: 0 hours 11 minutes 12 seconds  Total Procedure Duration: 0 hours 23 minutes 25 seconds  Estimated Blood Loss:  Estimated blood loss: none. Estimated  blood loss: none.      [facility]

## 2019-06-21 NOTE — H&P (Signed)
Peter Darby, MD [address]  [place]  [place], [place] [address]  Main: [phone]  Fax: [phone] Pager: [phone]  Primary Care Physician:  Jerrol Banana., MD Primary Gastroenterologist:  Dr. Cephas [name]  Pre-Procedure History & Physical: HPI:  Peter [name] is a 58 y.o. male is here for an colonoscopy.   Past Medical History:  Diagnosis Date  . Bipolar disorder ([place])   . Blood in stool   . Difficult intubation   . Dyspnea   . GERD (gastroesophageal reflux disease)   . Glaucoma    since 2004  . Hypertension    since 2000  . Obesity, unspecified   . Sleep apnea   . Ulcer 2011   gastric  . Unspecified hemorrhoids without mention of complication     Past Surgical History:  Procedure Laterality Date  . APPLICATION OF WOUND VAC N/A 11/16/2016   Procedure: APPLICATION OF WOUND VAC-ABDOMINAL;  Surgeon: Florene Glen, MD;  Location: [facility] ORS;  Service: General;  Laterality: N/A;  . APPLICATION OF WOUND VAC N/A 11/18/2016   Procedure: APPLICATION OF WOUND VAC;  Surgeon: Florene Glen, MD;  Location: [facility] ORS;  Service: General;  Laterality: N/A;  change  . APPLICATION OF WOUND VAC N/A 11/23/2016   Procedure: WOUND VAC CHANGE;  Surgeon: Olean Ree, MD;  Location: [facility] ORS;  Service: General;  Laterality: N/A;  wound vac application  . CARDIAC CATHETERIZATION Left 06/23/2016   Procedure: Left Heart Cath and Coronary Angiography;  Surgeon: Nelva Bush, MD;  Location: [facility] [facility];  Service: Cardiovascular;  Laterality: Left;  . CHOLECYSTECTOMY  1992  . COLONOSCOPY W/ POLYPECTOMY  05/22/2010   89mtransverse colon polyp, traditional serrated adenoma,negative for high grade dysplasia & malignancy. rectal polyp-564mnegative for dysplasia & malignancy.  . COLONOSCOPY WITH PROPOFOL N/A 06/20/2019   Procedure: COLONOSCOPY WITH PROPOFOL;  Surgeon: VaLin LandsmanMD;  [facility];  Service: Gastroenterology;   Laterality: N/A;  . DRESSING CHANGE UNDER ANESTHESIA N/A 11/21/2016   Procedure: DRESSING CHANGE UNDER ANESTHESIA;  Surgeon: CoFlorene GlenMD;  Location: [facility] ORS;  Service: General;  Laterality: N/A;  . ESOPHAGOGASTRODUODENOSCOPY (EGD) WITH PROPOFOL N/A 06/20/2019   Procedure: ESOPHAGOGASTRODUODENOSCOPY (EGD) WITH PROPOFOL;  Surgeon: VaLin LandsmanMD;  Location: [facility] ENDOSCOPY;  Service: Gastroenterology;  Laterality: N/A;  . KNEE SURGERY Left 2010  . LAPAROSCOPIC APPENDECTOMY N/A 10/30/2016   Procedure: APPENDECTOMY LAPAROSCOPIC changed  to open application of wound vac;  Surgeon: DiJules HusbandsMD;  Location: [facility] ORS;  Service: General;  Laterality: N/A;  . LAPAROTOMY N/A 11/11/2016   Procedure: EXPLORATORY LAPAROTOMY, DEBRIDEMENT OF ABDOMINAL WOUND, [place]BDOMINAL [place] Surgeon: PaJules HusbandsMD;  Location: [facility] ORS;  Service: General;  Laterality: N/A;  . LAPAROTOMY N/A 11/12/2016   Procedure: EXPLORATORY LAPAROTOMY;  Surgeon: WoClayburn PertMD;  Location: [facility] ORS;  Service: General;  Laterality: N/A;  . LAPAROTOMY N/A 11/14/2016   Procedure: EXPLORATORY LAPAROTOMY; Irrigation, partial closure;  Surgeon: WoClayburn PertMD;  Location: [facility] ORS;  Service: General;  Laterality: N/A;  . TOTAL KNEE ARTHROPLASTY Left 03/21/2019   Procedure: TOTAL KNEE ARTHROPLASTY;  Surgeon: BoLovell SheehanMD;  Location: [facility] ORS;  Service: Orthopedics;  Laterality: Left;    Prior to Admission medications   Medication Sig Start Date End Date Taking? Authorizing Provider  amoxicillin-clavulanate (AUGMENTIN) 875-125 MG tablet Take 1 tablet by mouth 2 (two) times daily for 7 days. 06/18/19 06/25/19 Yes McVey, ElGelene MinkPA-C  atorvastatin (LIPITOR) 40 MG tablet Take 1 tablet (40 mg total) by mouth daily. Patient taking differently: Take 40 mg by mouth daily at 6 PM.  01/29/19  Yes Jerrol Banana., MD  b complex vitamins capsule Take 1 capsule by mouth daily. 04/24/19  Yes Donnal Moat T, PA-C  carbamazepine (TEGRETOL) 200 MG tablet 3 po q am, 2 qhs. 04/24/19  Yes Hurst, Teresa T, PA-C  cimetidine (TAGAMET) 400 MG tablet Take 1 tablet (400 mg total) by mouth 2 (two) times daily. 06/18/19  Yes Jerrol Banana., MD  eplerenone (INSPRA) 50 MG tablet Take 50 mg by mouth daily.   Yes [provider]  hydrALAZINE (APRESOLINE) 100 MG tablet Take 1 tablet (100 mg total) by mouth 3 (three) times daily. 01/29/19  Yes Jerrol Banana., MD  lamoTRIgine (LAMICTAL) 200 MG tablet Take 1 tablet (200 mg total) by mouth at bedtime. 04/24/19  Yes Hurst, Teresa T, PA-C  latanoprost (XALATAN) 0.005 % ophthalmic solution Apply to eye.   Yes [provider]  losartan (COZAAR) 100 MG tablet Take 1 tablet (100 mg total) by mouth daily. 01/29/19  Yes Jerrol Banana., MD  LUMIGAN 0.01 % SOLN Place 1 drop into both eyes every morning.  08/19/17  Yes [provider]  metoprolol tartrate (LOPRESSOR) 50 MG tablet Take 1 tablet (50 mg total) by mouth 2 (two) times daily. 01/29/19  Yes Jerrol Banana., MD  NARCAN 4 MG/0.1ML LIQD nasal spray kit USE 1 SPRAY IEN UTD PRN 04/05/19  Yes [provider]  NON FORMULARY CPAP   Yes [provider]  sertraline (ZOLOFT) 100 MG tablet Take 1 tablet (100 mg total) by mouth daily. Patient taking differently: Take 100 mg by mouth 2 (two) times daily.  01/29/19  Yes Jerrol Banana., MD    Allergies as of 06/20/2019 - Review Complete 06/20/2019  Allergen Reaction Noted  . Oxycodone Other (See Comments) 11/27/2016  . Isosorbide Nausea And Vomiting 07/28/2016    Family History  Problem Relation Age of Onset  . Cancer Other        colon  . Hyperlipidemia Mother   . Hypertension Mother     Social History   Socioeconomic History  . Marital status: Married    Spouse name: Not on file  . Number of children: Not on file  . Years of education: Not on file  . Highest education level: Not on  file  Occupational History  . Not on file  Tobacco Use  . Smoking status: Never Smoker  . Smokeless tobacco: Never Used  Substance and Sexual Activity  . Alcohol use: Not Currently    Comment: occasionally  . Drug use: No  . Sexual activity: Not on file  Other Topics Concern  . Not on file  Social History Narrative  . Not on file   Social Determinants of Health   Financial Resource Strain:   . Difficulty of Paying Living Expenses: Not on file  Food Insecurity:   . Worried About Charity fundraiser in the Last Year: Not on file  . Ran Out of Food in the Last Year: Not on file  Transportation Needs:   . Lack of Transportation (Medical): Not on file  . Lack of Transportation (Non-Medical): Not on file  Physical Activity:   . Days of Exercise per Week: Not on file  . Minutes of Exercise per Session: Not on file  Stress:   .  Feeling of Stress : Not on file  Social Connections:   . Frequency of Communication with Friends and Family: Not on file  . Frequency of Social Gatherings with Friends and Family: Not on file  . Attends Religious Services: Not on file  . Active Member of Clubs or Organizations: Not on file  . Attends Archivist Meetings: Not on file  . Marital Status: Not on file  Intimate Partner Violence:   . Fear of Current or Ex-Partner: Not on file  . Emotionally Abused: Not on file  . Physically Abused: Not on file  . Sexually Abused: Not on file    Review of Systems: See HPI, otherwise negative ROS  Physical Exam: BP (!) 175/105   Pulse 89   Temp (!) 97.3 F (36.3 C) (Temporal)   Resp 18   Ht 5' 7" (1.702 m)   Wt 129.3 kg   SpO2 97%   BMI 44.64 kg/m  General:   Alert,  pleasant and cooperative in NAD Head:  Normocephalic and atraumatic. Neck:  Supple; no masses or thyromegaly. Lungs:  Clear throughout to auscultation.    Heart:  Regular rate and rhythm. Abdomen:  Soft, nontender and nondistended. Normal bowel sounds, without guarding,  and without rebound.   Neurologic:  Alert and  oriented x4;  grossly normal neurologically.  Impression/Plan: BAUER AUSBORN is here for an colonoscopy to be performed for colon cancer screening  Risks, benefits, limitations, and alternatives regarding  colonoscopy have been reviewed with the patient.  Questions have been answered.  All parties agreeable.   Sherri Sear, MD  06/21/2019, 9:53 AM

## 2019-06-21 NOTE — Anesthesia Post-op Follow-up Note (Signed)
Anesthesia QCDR form completed.        

## 2019-06-21 NOTE — Transfer of Care (Signed)
Immediate Anesthesia Transfer of Care Note  Patient: GLENDON GRAVOIS  Procedure(s) Performed: COLONOSCOPY WITH PROPOFOL (N/A )  Patient Location: PACU  Anesthesia Type:General  Level of Consciousness: sedated  Airway & Oxygen Therapy: Patient Spontanous Breathing and Patient connected to nasal cannula oxygen  Post-op Assessment: Report given to RN and Post -op Vital signs reviewed and stable  Post vital signs: Reviewed and stable  Last Vitals:  Vitals Value Taken Time  BP 136/81 06/21/19 1038  Temp    Pulse 85 06/21/19 1039  Resp 22 06/21/19 1039  SpO2 96 % 06/21/19 1039  Vitals shown include unvalidated device data.  Last Pain:  Vitals:   06/21/19 0932  TempSrc: Temporal  PainSc: 0-No pain         Complications: No apparent anesthesia complications

## 2019-06-22 ENCOUNTER — Encounter: Payer: Self-pay | Admitting: *Deleted

## 2019-06-25 ENCOUNTER — Telehealth: Payer: Self-pay | Admitting: Family Medicine

## 2019-06-25 DIAGNOSIS — J309 Allergic rhinitis, unspecified: Secondary | ICD-10-CM

## 2019-06-25 NOTE — Anesthesia Postprocedure Evaluation (Signed)
Anesthesia Post Note  Patient: Peter [name]  Procedure(s) Performed: COLONOSCOPY WITH PROPOFOL (N/A )  Patient location during evaluation: PACU Anesthesia Type: General Level of consciousness: awake and alert Pain management: pain level controlled Vital Signs Assessment: post-procedure vital signs reviewed and stable Respiratory status: spontaneous breathing, nonlabored ventilation, respiratory function stable and patient connected to nasal cannula oxygen Cardiovascular status: blood pressure returned to baseline and stable Postop Assessment: no apparent nausea or vomiting Anesthetic complications: no     Last Vitals:  Vitals:   06/21/19 0932 06/21/19 1038  BP: (!) 175/105 136/81  Pulse: 89 85  Resp: 18 (!) 22  Temp: (!) 36.3 C 36.5 C  SpO2: 97% 94%    Last Pain:  Vitals:   06/22/19 0845  TempSrc:   PainSc: 0-No pain                 Molli Barrows

## 2019-06-25 NOTE — Telephone Encounter (Signed)
Referral Request - Has patient seen PCP for this complaint? No. *If NO, is insurance requiring patient see PCP for this issue before PCP can refer them? Referral for which specialty: Allergy Preferred provider/office: what his insurance will covered Reason for referral: allergies

## 2019-06-25 NOTE — Telephone Encounter (Signed)
I am ok with referral to allergist. Looks like he was seen again in December for same issues elsewhere as well.

## 2019-06-25 NOTE — Telephone Encounter (Signed)
Referral has been placed. 

## 2019-06-25 NOTE — Telephone Encounter (Signed)
Please advise referral? Patient had ov with Vernie Murders 05/25/2019.

## 2019-06-25 NOTE — Anesthesia Preprocedure Evaluation (Signed)
Anesthesia Evaluation  Patient identified by MRN, date of birth, ID band Patient awake    Reviewed: Allergy & Precautions, H&P , NPO status , Patient's Chart, lab work & pertinent test results, reviewed documented beta blocker date and time   Airway Mallampati: II   Neck ROM: full    Dental  (+) Poor Dentition   Pulmonary shortness of breath, sleep apnea ,    Pulmonary exam normal        Cardiovascular Exercise Tolerance: Poor hypertension, On Medications negative cardio ROS Normal cardiovascular exam Rhythm:regular Rate:Normal     Neuro/Psych PSYCHIATRIC DISORDERS Depression Bipolar Disorder negative neurological ROS     GI/Hepatic Neg liver ROS, GERD  ,  Endo/Other  negative endocrine ROS  Renal/GU negative Renal ROS  negative genitourinary   Musculoskeletal   Abdominal   Peds  Hematology negative hematology ROS (+)   Anesthesia Other Findings Past Medical History: No date: Bipolar disorder ([place]) No date: Blood in stool No date: Difficult intubation No date: Dyspnea No date: GERD (gastroesophageal reflux disease) No date: Glaucoma     Comment:  since 2004 No date: Hypertension     Comment:  since 2000 No date: Obesity, unspecified No date: Sleep apnea 2011: Ulcer     Comment:  gastric No date: Unspecified hemorrhoids without mention of complication Past Surgical History: [id number]: APPLICATION OF WOUND VAC; N/A     Comment:  Procedure: APPLICATION OF WOUND VAC-ABDOMINAL;  Surgeon:              Florene Glen, MD;  Location: [facility] ORS;  Service:               General;  Laterality: N/A; 0000000: APPLICATION OF WOUND VAC; N/A     Comment:  Procedure: APPLICATION OF WOUND VAC;  Surgeon: Florene Glen, MD;  Location: [facility] OR[facility];  Service: General;                Laterality: N/A;  change [id number]: APPLICATION OF WOUND VAC; N/A     Comment:  Procedure: WOUND VAC CHANGE;  Surgeon:  Olean Ree,               MD;  Location: [facility] ORS;  Service: General;  Laterality:               N/A;  wound vac application [id number]: CARDIAC CATHETERIZATION; Left     Comment:  Procedure: Left Heart Cath and Coronary Angiography;                Surgeon: Nelva Bush, MD;  Location: [place] [facility]              [facility];  Service: Cardiovascular;  Laterality: Left; 1992: CHOLECYSTECTOMY 05/22/2010: COLONOSCOPY W/ POLYPECTOMY     Comment:  73mmtransverse colon polyp, traditional serrated               adenoma,negative for high grade dysplasia & malignancy.               rectal polyp-83mm,negative for dysplasia & malignancy. 06/20/2019: COLONOSCOPY WITH PROPOFOL; N/A     Comment:  Procedure: COLONOSCOPY WITH PROPOFOL;  Surgeon: Lin Landsman, MD;  Location: [facility] END[facility];  Service:               Gastroenterology;  Laterality: N/A; 06/21/2019: COLONOSCOPY WITH PROPOFOL; N/A  Comment:  Procedure: COLONOSCOPY WITH PROPOFOL;  Surgeon: Lin Landsman, MD;  Location: [facility] ENDOS[facility];  Service:               Gastroenterology;  Laterality: N/A; 11/21/2016: DRESSING CHANGE UNDER ANESTHESIA; N/A     Comment:  Procedure: DRESSING CHANGE UNDER ANESTHESIA;  Surgeon:               Florene Glen, MD;  Location: [facility];  Service:               General;  Laterality: N/A; 06/20/2019: ESOPHAGOGASTRODUODENOSCOPY (EGD) WITH PROPOFOL; N/A     Comment:  Procedure: ESOPHAGOGASTRODUODENOSCOPY (EGD) WITH               PROPOFOL;  Surgeon: Lin Landsman, MD;  Location:               [facility];  Service: Gastroenterology;  Laterality:               N/A; 2010: KNEE SURGERY; Left 10/30/2016: LAPAROSCOPIC APPENDECTOMY; N/A     Comment:  Procedure: APPENDECTOMY LAPAROSCOPIC changed  to open               application of wound vac;  Surgeon: [name], MD;                Location: [facility] OR[facility];  Service: General;  Laterality: N/A; 11/11/2016: LAPAROTOMY; N/A      Comment:  Procedure: EXPLORATORY LAPAROTOMY, DEBRIDEMENT OF               ABDOMINAL WOUND, ABDOMINAL [place];  Surgeon: [name], MD;  Location: [facility] OR[facility];  Service: General;                Laterality: N/A; 11/12/2016: LAPAROTOMY; N/A     Comment:  Procedure: EXPLORATORY LAPAROTOMY;  Surgeon: Clayburn Pert, MD;  Location: [facility] OR[facility];  Service: General;                Laterality: N/A; 11/14/2016: LAPAROTOMY; N/A     Comment:  Procedure: EXPLORATORY LAPAROTOMY; Irrigation, partial               closure;  Surgeon: Clayburn Pert, MD;  Location: [facility]               ORS;  Service: General;  Laterality: N/A; 03/21/2019: TOTAL KNEE ARTHROPLASTY; Left     Comment:  Procedure: TOTAL KNEE ARTHROPLASTY;  Surgeon: Lovell Sheehan, MD;  Location: [facility] ORS;  Service: Orthopedics;               Laterality: Left; BMI    Body Mass Index: 44.64 kg/m     Reproductive/Obstetrics negative OB ROS                             Anesthesia Physical Anesthesia Plan  ASA: III  Anesthesia Plan: General   Post-op Pain Management:    Induction:   PONV Risk Score and Plan:   Airway Management Planned:   Additional Equipment:   Intra-op Plan:   Post-operative Plan:   Informed Consent: I have reviewed  the patients History and Physical, chart, labs and discussed the procedure including the risks, benefits and alternatives for the proposed anesthesia with the patient or authorized representative who has indicated his/her understanding and acceptance.     Dental Advisory Given  Plan Discussed with: CRNA  Anesthesia Plan Comments:         Anesthesia Quick Evaluation

## 2019-06-27 NOTE — Anesthesia Postprocedure Evaluation (Signed)
Anesthesia Post Note  Patient: Peter [name]  Procedure(s) Performed: COLONOSCOPY WITH PROPOFOL (N/A ) ESOPHAGOGASTRODUODENOSCOPY (EGD) WITH PROPOFOL (N/A )  Patient location during evaluation: PACU Anesthesia Type: General Level of consciousness: awake and alert Pain management: pain level controlled Vital Signs Assessment: post-procedure vital signs reviewed and stable Respiratory status: spontaneous breathing, nonlabored ventilation, respiratory function stable and patient connected to nasal cannula oxygen Cardiovascular status: blood pressure returned to baseline and stable Postop Assessment: no apparent nausea or vomiting Anesthetic complications: no     Last Vitals:  Vitals:   06/20/19 1005 06/20/19 1015  BP: (!) 179/97 (!) 181/84  Pulse: 77 77  Resp: 14 (!) 21  Temp:    SpO2: 97% 95%    Last Pain:  Vitals:   06/20/19 1015  TempSrc:   PainSc: 0-No pain                 Molli Barrows

## 2019-07-10 ENCOUNTER — Ambulatory Visit: Payer: 59 | Admitting: Physician Assistant

## 2019-07-31 ENCOUNTER — Other Ambulatory Visit: Payer: Self-pay

## 2019-07-31 ENCOUNTER — Encounter: Payer: Self-pay | Admitting: Physician Assistant

## 2019-07-31 ENCOUNTER — Ambulatory Visit (INDEPENDENT_AMBULATORY_CARE_PROVIDER_SITE_OTHER): Payer: 59 | Admitting: Physician Assistant

## 2019-07-31 DIAGNOSIS — F411 Generalized anxiety disorder: Secondary | ICD-10-CM

## 2019-07-31 DIAGNOSIS — F319 Bipolar disorder, unspecified: Secondary | ICD-10-CM | POA: Diagnosis not present

## 2019-07-31 DIAGNOSIS — G4700 Insomnia, unspecified: Secondary | ICD-10-CM

## 2019-07-31 DIAGNOSIS — R413 Other amnesia: Secondary | ICD-10-CM

## 2019-07-31 DIAGNOSIS — R748 Abnormal levels of other serum enzymes: Secondary | ICD-10-CM

## 2019-07-31 MED ORDER — LAMOTRIGINE 200 MG PO TABS: 300.0000 mg | ORAL_TABLET | Freq: Every day | ORAL | 1 refills | Status: DC

## 2019-07-31 MED ORDER — ALPRAZOLAM 0.5 MG PO TABS: 0.5000 mg | ORAL_TABLET | Freq: Two times a day (BID) | ORAL | 1 refills | Status: DC | PRN

## 2019-07-31 NOTE — Progress Notes (Signed)
[facility]  Patient ID: Peter [name],  MRN: [id number]  PCP: Jerrol Banana., MD  Date of Evaluation: 07/31/2019 Time spent:30 minutes  Chief Complaint:  Chief Complaint    Depression; Anxiety      HISTORY/CURRENT STATUS: HPI for routine med check.  Since he was here the last time, he had neuropsychological testing.  My understanding is that he saw a neurologist at [facility] (see note on chart) who discussed different causes of memory loss including mood, sleep patterns, and stress.  They also discussed Aricept but the patient declined at that time.  They also recommended exercise as well as labs including vitamin D and ferritin and starting vitamin B12 and vitamin D.  He was sent for a neurocognitive evaluation.  To my knowledge the work-up has not yet been done.  As far as his mental health is concerned, he feels that he is doing well.  He does have some depression, mostly sadness because he lost his job last year and at his age it is difficult to find another job.  His energy and motivation are sometimes low, partly because of the depression but also due to his physical limitations from being overweight.  He denies suicidal or homicidal thoughts.  Patient denies increased energy with decreased need for sleep, no increased talkativeness, no racing thoughts, no impulsivity or risky behaviors, no increased spending, no increased libido, no grandiosity.  He still has anxiety on a daily basis.  The Xanax does help.  It also helps him relax to go to sleep at night.  Denies dizziness, syncope, seizures, numbness, tingling, tremor, tics, unsteady gait, slurred speech, confusion. Denies muscle or joint pain, stiffness, or dystonia.  Individual Medical History/ Review of Systems: Changes? :No    Past medications for mental health diagnoses include: Zoloft, Cymbalta did not help, Pristiq did not help, Seroquel, Xanax, Depakote, Lamictal, Equetro, Klonopin  Allergies:  Oxycodone and Isosorbide  Current Medications:  Current Outpatient Medications:  .  atorvastatin (LIPITOR) 40 MG tablet, Take 1 tablet (40 mg total) by mouth daily. (Patient taking differently: Take 40 mg by mouth daily at 6 PM. ), Disp: 90 tablet, Rfl: 3 .  b complex vitamins capsule, Take 1 capsule by mouth daily., Disp: 30 capsule, Rfl: 11 .  carbamazepine (TEGRETOL) 200 MG tablet, 3 po q am, 2 qhs., Disp: 450 tablet, Rfl: 1 .  cimetidine (TAGAMET) 400 MG tablet, Take 1 tablet (400 mg total) by mouth 2 (two) times daily., Disp: 90 tablet, Rfl: 1 .  eplerenone (INSPRA) 50 MG tablet, Take 50 mg by mouth daily., Disp: , Rfl:  .  hydrALAZINE (APRESOLINE) 100 MG tablet, Take 1 tablet (100 mg total) by mouth 3 (three) times daily., Disp: 270 tablet, Rfl: 3 .  lamoTRIgine (LAMICTAL) 200 MG tablet, Take 1.5 tablets (300 mg total) by mouth at bedtime., Disp: 135 tablet, Rfl: 1 .  latanoprost (XALATAN) 0.005 % ophthalmic solution, Apply to eye., Disp: , Rfl:  .  losartan (COZAAR) 100 MG tablet, Take 1 tablet (100 mg total) by mouth daily., Disp: 90 tablet, Rfl: 3 .  metoprolol tartrate (LOPRESSOR) 50 MG tablet, Take 1 tablet (50 mg total) by mouth 2 (two) times daily., Disp: 180 tablet, Rfl: 3 .  NARCAN 4 MG/0.1ML LIQD nasal spray kit, USE 1 SPRAY IEN UTD PRN, Disp: , Rfl:  .  NON FORMULARY, CPAP, Disp: , Rfl:  .  sertraline (ZOLOFT) 100 MG tablet, Take 1 tablet (100 mg total) by mouth daily. (  Patient taking differently: Take 100 mg by mouth 2 (two) times daily. ), Disp: 90 tablet, Rfl: 3 .  ALPRAZolam (XANAX) 0.5 MG tablet, Take 1 tablet (0.5 mg total) by mouth 2 (two) times daily as needed for anxiety., Disp: 60 tablet, Rfl: 1 .  LUMIGAN 0.01 % SOLN, Place 1 drop into both eyes every morning. , Disp: , Rfl: 7 Medication Side Effects: none  Family Medical/ Social History: Changes? No   MENTAL HEALTH EXAM:  There were no vitals taken for this visit.There is no height or weight on file to calculate  BMI.  General Appearance: Casual, Neat, Well Groomed and Obese  Eye Contact:  Good  Speech:  Clear and Coherent  Volume:  Normal  Mood:  Euthymic  Affect:  Appropriate  Thought Process:  Goal Directed and Descriptions of Associations: Intact  Orientation:  Full (Time, Place, and Person)  Thought Content: Logical   Suicidal Thoughts:  No  Homicidal Thoughts:  No  Memory:  Immediate;   Good Recent;   Fair Remote;   Good  Judgement:  Good  Insight:  Good  Psychomotor Activity:  Normal  Concentration:  Concentration: Good and Attention Span: Good  Recall:  Good  Fund of Knowledge: Good  Language: Good  Assets:  Desire for Improvement  ADL's:  Intact  Cognition: WNL  Prognosis:  Good  04/30/2019 carbamazepine level was 10.9, TSH was 2.4, B12 376, folate 4.2, CBC with differential was normal, CMP was completely normal except for alkaline phosphatase was 144.  (02/01/2019 the alk phos was 123)  DIAGNOSES:    ICD-10-CM   1. Generalized anxiety disorder  F41.1   2. Insomnia, unspecified type  G47.00   3. Bipolar disorder with depression ([place])  F31.9   4. Memory loss  R41.3   5. Blood alkaline phosphatase increased compared with prior measurement  R74.8     Receiving Psychotherapy: No    RECOMMENDATIONS:  I spent 30 minutes with the patient.  PDMP was reviewed. We discussed the alkaline phosphatase.  I do not think it is significant, especially with the AST and ALT being normal but I would like for him to see his PCP.  I will send Dr. Rosanna Randy a note getting him know of this finding and ask him to follow-up with the patient.   Continue Xanax 0.5 mg 1 twice daily as needed. Continue carbamazepine 200 mg, 3 p.o. every morning and 2 nightly. Increase Lamictal 200 mg to 1-1/2 pills daily.  He has been on a relatively low dose given the fact that he is also on carbamazepine.  He has done well with that dose but now since he is having more depressive symptoms, we need to increase the  Lamictal.  He verbalizes understanding.   Continue Zoloft 100 mg, 1 p.o. twice daily. Return in 6 weeks.  Donnal Moat, PA-C

## 2019-08-14 ENCOUNTER — Ambulatory Visit (INDEPENDENT_AMBULATORY_CARE_PROVIDER_SITE_OTHER): Payer: Managed Care, Other (non HMO) | Admitting: Gastroenterology

## 2019-08-14 ENCOUNTER — Encounter: Payer: Self-pay | Admitting: Gastroenterology

## 2019-08-14 ENCOUNTER — Other Ambulatory Visit: Payer: Self-pay

## 2019-08-14 VITALS — BP 164/91 | HR 54 | Temp 98.3°F | Wt 287.0 lb

## 2019-08-14 DIAGNOSIS — R1310 Dysphagia, unspecified: Secondary | ICD-10-CM

## 2019-08-14 MED ORDER — OMEPRAZOLE 40 MG PO CPDR: 40.0000 mg | DELAYED_RELEASE_CAPSULE | Freq: Every day | ORAL | 3 refills | Status: DC

## 2019-08-14 NOTE — Progress Notes (Signed)
Peter Darby, MD [address]  [place]  [place], [place] [address]  Main: [phone]  Fax: [phone]    Gastroenterology Consultation  Referring Provider:     Jerrol [name].,* Primary Care Physician:  Peter [name]., MD Primary Gastroenterologist:  Dr. Cephas [name] Reason for Consultation: Dysphagia        HPI:   Peter [name] is a 59 y.o. male referred by Dr. Rosanna Randy, Peter [name]., MD  for consultation & management of chronic dysphagia.  Patient reports that he has been experiencing difficulty swallowing to solids for several years and particularly to fatty foods.  He tried putting her millimeters in the past and currently on cimetidine.  He denies heartburn, regurgitation.  He had complicated abdominal surgery in 2018 after laparoscopic appendectomy converted to open surgery complicated by abdominal wound evisceration, open abdomin for several weeks, wound debridements, wound VAC placement.  He reported that his length of stay was about 2 and 1/2 months in the hospital.  He has recently undergone knee replacement.  He is morbidly obese, suffering from severe constipation which is now under control.  He denies rectal bleeding, abdominal pain, nausea or vomiting.  Follow-up visit 08/14/2019 Patient reports that he continues to have sensation of food stuck in his throat.  He underwent EGD which was unremarkable including esophageal biopsies.  He is currently taking cimetidine.  He has no other concerns today.  He also underwent colonoscopy with fair prep   NSAIDs: None  Antiplts/Anticoagulants/Anti thrombotics: None  GI Procedures: Underwent colonoscopy 2018, poor prep  EGD and colonoscopy 06/20/2019 - Mucosal changes in the duodenum. Biopsied. - Closed previous gastrostomy present characterized by healthy appearing mucosa. - Esophagogastric landmarks identified. - Normal gastroesophageal junction. - Normal gastroesophageal junction and  esophagus. Biopsied.  - Preparation of the colon was inadequate. - Two 5 to 8 mm polyps in the descending colon and in the transverse colon, removed with a cold snare. Resected and retrieved. - Stool in the entire examined colon. - The distal rectum and anal verge are normal on retroflexion view.  DIAGNOSIS:  A. DUODENUM, POLYPOID MUCOSA; COLD BIOPSY:  - DUODENAL MUCOSA WITH FEATURES OF PEPTIC DUODENITIS.  - NEGATIVE FOR FEATURES OF CELIAC, DYSPLASIA, AND MALIGNANCY.   B. ESOPHAGUS, RANDOM; COLD BIOPSY:  - BENIGN SQUAMOUS MUCOSA WITH NO SIGNIFICANT HISTOPATHOLOGIC CHANGE.  - NO INCREASE IN INTRAEPITHELIAL EOSINOPHILS (LESS THAN 2 PER HPF).   C. COLON POLYP, TRANSVERSE; COLD SNARE:  - SESSILE SERRATED POLYP.  - NEGATIVE FOR DYSPLASIA AND MALIGNANCY.   D. COLON POLYP, DESCENDING; COLD SNARE:  - SESSILE SERRATED POLYP.  - NEGATIVE FOR DYSPLASIA AND MALIGNANCY.   Past Medical History:  Diagnosis Date  . Bipolar disorder ([place])   . Blood in stool   . Difficult intubation   . Dyspnea   . GERD (gastroesophageal reflux disease)   . Glaucoma    since 2004  . Hypertension    since 2000  . Obesity, unspecified   . Sleep apnea   . Ulcer 2011   gastric  . Unspecified hemorrhoids without mention of complication     Past Surgical History:  Procedure Laterality Date  . APPLICATION OF WOUND VAC N/A 11/16/2016   Procedure: APPLICATION OF WOUND VAC-ABDOMINAL;  Surgeon: Florene Glen, MD;  Location: [facility];  Service: General;  Laterality: N/A;  . APPLICATION OF WOUND VAC N/A 11/18/2016   Procedure: APPLICATION OF WOUND VAC;  Surgeon: Florene Glen, MD;  Location: [facility];  Service: General;  Laterality: N/A;  change  . APPLICATION OF WOUND VAC N/A 11/23/2016   Procedure: WOUND VAC CHANGE;  Surgeon: Olean Ree, MD;  Location: [facility];  Service: General;  Laterality: N/A;  wound vac application  . CARDIAC CATHETERIZATION Left 06/23/2016   Procedure: Left Heart Cath and  Coronary Angiography;  Surgeon: Nelva Bush, MD;  Location: [facility];  Service: Cardiovascular;  Laterality: Left;  . CHOLECYSTECTOMY  1992  . COLONOSCOPY W/ POLYPECTOMY  05/22/2010   72mtransverse colon polyp, traditional serrated adenoma,negative for high grade dysplasia & malignancy. rectal polyp-542mnegative for dysplasia & malignancy.  . COLONOSCOPY WITH PROPOFOL N/A 06/20/2019   Procedure: COLONOSCOPY WITH PROPOFOL;  Surgeon: VaLin LandsmanMD;  [facility];  Service: Gastroenterology;  Laterality: N/A;  . COLONOSCOPY WITH PROPOFOL N/A 06/21/2019   Procedure: COLONOSCOPY WITH PROPOFOL;  Surgeon: VaLin LandsmanMD;  [facility];  Service: Gastroenterology;  Laterality: N/A;  . DRESSING CHANGE UNDER ANESTHESIA N/A 11/21/2016   Procedure: DRESSING CHANGE UNDER ANESTHESIA;  Surgeon: CoFlorene GlenMD;  Location: [facility];  Service: General;  Laterality: N/A;  . ESOPHAGOGASTRODUODENOSCOPY (EGD) WITH PROPOFOL N/A 06/20/2019   Procedure: ESOPHAGOGASTRODUODENOSCOPY (EGD) WITH PROPOFOL;  Surgeon: VaLin LandsmanMD;  Location: [facility];  Service: Gastroenterology;  Laterality: N/A;  . KNEE SURGERY Left 2010  . LAPAROSCOPIC APPENDECTOMY N/A 10/30/2016   Procedure: APPENDECTOMY LAPAROSCOPIC changed  to open application of wound vac;  Surgeon: DiJules HusbandsMD;  Location: [facility];  Service: General;  Laterality: N/A;  . LAPAROTOMY N/A 11/11/2016   Procedure: EXPLORATORY LAPAROTOMY, DEBRIDEMENT OF ABDOMINAL WOUND, ABDOMINAL WAEl Dara Surgeon: PaJules HusbandsMD;  Location: [facility];  Service: General;  Laterality: N/A;  . LAPAROTOMY N/A 11/12/2016   Procedure: EXPLORATORY LAPAROTOMY;  Surgeon: WoClayburn PertMD;  Location: [facility];  Service: General;  Laterality: N/A;  . LAPAROTOMY N/A 11/14/2016   Procedure: EXPLORATORY LAPAROTOMY; Irrigation, partial closure;  Surgeon: WoClayburn PertMD;  Location: [facility];  Service: General;   Laterality: N/A;  . TOTAL KNEE ARTHROPLASTY Left 03/21/2019   Procedure: TOTAL KNEE ARTHROPLASTY;  Surgeon: BoLovell SheehanMD;  Location: [facility];  Service: Orthopedics;  Laterality: Left;     Current Outpatient Medications:  .  ALPRAZolam (XANAX) 0.5 MG tablet, Take 1 tablet (0.5 mg total) by mouth 2 (two) times daily as needed for anxiety., Disp: 60 tablet, Rfl: 1 .  atorvastatin (LIPITOR) 40 MG tablet, Take 1 tablet (40 mg total) by mouth daily. (Patient taking differently: Take 40 mg by mouth daily at 6 PM. ), Disp: 90 tablet, Rfl: 3 .  b complex vitamins capsule, Take 1 capsule by mouth daily., Disp: 30 capsule, Rfl: 11 .  carbamazepine (TEGRETOL) 200 MG tablet, 3 po q am, 2 qhs., Disp: 450 tablet, Rfl: 1 .  cimetidine (TAGAMET) 400 MG tablet, Take 1 tablet (400 mg total) by mouth 2 (two) times daily., Disp: 90 tablet, Rfl: 1 .  eplerenone (INSPRA) 50 MG tablet, Take 50 mg by mouth daily., Disp: , Rfl:  .  hydrALAZINE (APRESOLINE) 100 MG tablet, Take 1 tablet (100 mg total) by mouth 3 (three) times daily., Disp: 270 tablet, Rfl: 3 .  lamoTRIgine (LAMICTAL) 200 MG tablet, Take 1.5 tablets (300 mg total) by mouth at bedtime., Disp: 135 tablet, Rfl: 1 .  latanoprost (XALATAN) 0.005 % ophthalmic solution, Apply to eye., Disp: , Rfl:  .  losartan (COZAAR) 100 MG tablet,  Take 1 tablet (100 mg total) by mouth daily., Disp: 90 tablet, Rfl: 3 .  LUMIGAN 0.01 % SOLN, Place 1 drop into both eyes every morning. , Disp: , Rfl: 7 .  metoprolol tartrate (LOPRESSOR) 50 MG tablet, Take 1 tablet (50 mg total) by mouth 2 (two) times daily., Disp: 180 tablet, Rfl: 3 .  NARCAN 4 MG/0.1ML LIQD nasal spray kit, USE 1 SPRAY IEN UTD PRN, Disp: , Rfl:  .  NON FORMULARY, CPAP, Disp: , Rfl:  .  Omega-3 Fatty Acids (FISH OIL) 1000 MG CAPS, Take by mouth., Disp: , Rfl:  .  sertraline (ZOLOFT) 100 MG tablet, Take 1 tablet (100 mg total) by mouth daily. (Patient taking differently: Take 100 mg by mouth 2 (two) times  daily. ), Disp: 90 tablet, Rfl: 3 .  omeprazole (PRILOSEC) 40 MG capsule, Take 1 capsule (40 mg total) by mouth daily before breakfast., Disp: 30 capsule, Rfl: 3  Family History  Problem Relation Age of Onset  . Cancer Other        colon  . Hyperlipidemia Mother   . Hypertension Mother      Social History   Tobacco Use  . Smoking status: Never Smoker  . Smokeless tobacco: Never Used  Substance Use Topics  . Alcohol use: Not Currently    Comment: occasionally  . Drug use: No    Allergies as of 08/14/2019 - Review Complete 08/14/2019  Allergen Reaction Noted  . Oxycodone Other (See Comments) 11/27/2016  . Isosorbide Nausea And Vomiting 07/28/2016    Review of Systems:    All systems reviewed and negative except where noted in HPI.   Physical Exam:  BP (!) 164/91 (BP Location: Left Arm, Patient Position: Sitting, Cuff Size: Normal)   Pulse (!) 54   Temp 98.3 F (36.8 C) (Oral)   Wt 287 lb (130.2 kg)   BMI 44.95 kg/m  No LMP for male patient.  General:   Alert,  Well-developed, well-nourished, pleasant and cooperative in NAD Head:  Normocephalic and atraumatic. Eyes:  Sclera clear, no icterus.   Conjunctiva pink. Ears:  Normal auditory acuity. Nose:  No deformity, discharge, or lesions. Mouth:  No deformity or lesions,oropharynx pink & moist. Neck:  Supple; no masses or thyromegaly. Lungs:  Respirations even and unlabored.  Clear throughout to auscultation.   No wheezes, crackles, or rhonchi. No acute distress. Heart:  Regular rate and rhythm; no murmurs, clicks, rubs, or gallops. Abdomen:  Normal bowel sounds. Soft, non-tender, palpable phlegmon in the mid upper abdomen measuring about golfball size and non-distended without masses, hepatosplenomegaly or hernias noted.  No guarding or rebound tenderness.   Rectal: Not performed Msk:  Symmetrical without gross deformities. Good, equal movement & strength bilaterally. Pulses:  Normal pulses noted. Extremities:  No  clubbing or edema.  No cyanosis. Neurologic:  Alert and oriented x3;  grossly normal neurologically. Skin:  Intact without significant lesions or rashes. No jaundice. Psych:  Alert and cooperative. Normal mood and affect.  Imaging Studies: Reviewed  Assessment and Plan:   LINDWOOD MOGEL is a 59 y.o. male with morbid obesity, hypertension, status post laparoscopic appendectomy converted to open complicated by abdominal wound evisceration, wound VAC, surgical debridements seen in consultation for chronic dysphagia to solids  Chronic dysphagia EGD is unremarkable including esophageal biopsies, no evidence of erosive esophagitis or eosinophilic esophagitis No evidence of Barrett's esophagus Discontinue cimetidine  Empiric trial of omeprazole 40 mg daily before meals  Sessile serrated polyps <1cm in the  colon, fair prep Recommend surveillance colonoscopy in 2023 with 2-day prep   Follow up in 3 months   Peter Darby, MD

## 2019-08-14 NOTE — Progress Notes (Signed)
Patient: Peter [name] Male    DOB: 1960-10-24   59 y.o.   MRN: [id number] Visit Date: 08/15/2019  Today's Provider: [name], MD   Chief Complaint  Patient presents with  . Follow-up  . Hypertension   Subjective:     HPI  Overall patient doing fairly well.  Depression stable. Essential (primary) hypertension From 05/16/2019-no changes were made.  Home blood pressure averaging 137/90.   MCI (mild cognitive impairment) From 05/16/2019-referred to Neurology.  Obstructive sleep apnea syndrome From 05/16/2019-On CPAP.  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  Adjustment disorder with mixed anxiety and depressed mood From 05/16/2019-May need psychiatry rereferral in future.   Allergies  Allergen Reactions  . Oxycodone Other (See Comments)    Causes Hallucinations, auditory and tactile  . Isosorbide Nausea And Vomiting    Headache, nausea, vomiting     Current Outpatient Medications:  .  ALPRAZolam (XANAX) 0.5 MG tablet, Take 1 tablet (0.5 mg total) by mouth 2 (two) times daily as needed for anxiety., Disp: 60 tablet, Rfl: 1 .  aspirin EC 81 MG tablet, Take 81 mg by mouth daily., Disp: , Rfl:  .  atorvastatin (LIPITOR) 40 MG tablet, Take 1 tablet (40 mg total) by mouth daily. (Patient taking differently: Take 40 mg by mouth daily at 6 PM. ), Disp: 90 tablet, Rfl: 3 .  azelastine (ASTELIN) 0.1 % nasal spray, SMARTSIG:1-2 Spray(s) Both Nares Twice Daily, Disp: , Rfl:  .  b complex vitamins capsule, Take 1 capsule by mouth daily., Disp: 30 capsule, Rfl: 11 .  carbamazepine (TEGRETOL) 200 MG tablet, 3 po q am, 2 qhs., Disp: 450 tablet, Rfl: 1 .  cetirizine (ZYRTEC) 10 MG tablet, Take 10 mg by mouth daily., Disp: , Rfl:  .  eplerenone (INSPRA) 50 MG tablet, Take 50 mg by mouth daily., Disp: , Rfl:  .  fluticasone (FLONASE) 50 MCG/ACT nasal spray, SMARTSIG:1-2 Spray(s) Both Nares Daily, Disp: , Rfl:  .  hydrALAZINE  (APRESOLINE) 100 MG tablet, Take 1 tablet (100 mg total) by mouth 3 (three) times daily., Disp: 270 tablet, Rfl: 3 .  ibuprofen (ADVIL) 800 MG tablet, , Disp: , Rfl:  .  lamoTRIgine (LAMICTAL) 200 MG tablet, Take 1.5 tablets (300 mg total) by mouth at bedtime., Disp: 135 tablet, Rfl: 1 .  latanoprost (XALATAN) 0.005 % ophthalmic solution, Apply to eye., Disp: , Rfl:  .  losartan (COZAAR) 100 MG tablet, Take 1 tablet (100 mg total) by mouth daily., Disp: 90 tablet, Rfl: 3 .  metoprolol tartrate (LOPRESSOR) 50 MG tablet, Take 1 tablet (50 mg total) by mouth 2 (two) times daily., Disp: 180 tablet, Rfl: 3 .  montelukast (SINGULAIR) 10 MG tablet, Take 10 mg by mouth at bedtime., Disp: , Rfl:  .  NON FORMULARY, CPAP, Disp: , Rfl:  .  Omega-3 Fatty Acids (FISH OIL) 1000 MG CAPS, Take by mouth., Disp: , Rfl:  .  omeprazole (PRILOSEC) 40 MG capsule, Take 1 capsule (40 mg total) by mouth daily before breakfast., Disp: 30 capsule, Rfl: 3 .  sertraline (ZOLOFT) 100 MG tablet, Take 1 tablet (100 mg total) by mouth daily. (Patient taking differently: Take 100 mg by mouth 2 (two) times daily. ), Disp: 90 tablet, Rfl: 3 .  UNABLE TO FIND, 900 mg by Per post-pyloric tube route. Med Name: Kyolic, Disp: , Rfl:  .  vitamin B-12 (CYANOCOBALAMIN) 1000 MCG tablet, , Disp: , Rfl:  .  cimetidine (TAGAMET)  400 MG tablet, Take 1 tablet (400 mg total) by mouth 2 (two) times daily., Disp: 90 tablet, Rfl: 1 .  LUMIGAN 0.01 % SOLN, Place 1 drop into both eyes every morning. , Disp: , Rfl: 7 .  NARCAN 4 MG/0.1ML LIQD nasal spray kit, USE 1 SPRAY IEN UTD PRN, Disp: , Rfl:   Review of Systems  Constitutional: Negative for appetite change, chills and fever.  HENT: Negative.   Eyes: Negative.   Respiratory: Negative for chest tightness, shortness of breath and wheezing.   Cardiovascular: Negative for chest pain and palpitations.  Gastrointestinal: Negative for abdominal pain, nausea and vomiting.  Endocrine: Negative.    Musculoskeletal: Positive for arthralgias.  Allergic/Immunologic: Negative.   Psychiatric/Behavioral: Negative.     Social History   Tobacco Use  . Smoking status: Never Smoker  . Smokeless tobacco: Never Used  Substance Use Topics  . Alcohol use: Not Currently    Comment: occasionally      Objective:   BP (!) 169/75 (BP Location: Right Arm, Cuff Size: Large)   Pulse (!) 54   Temp (!) 97.5 F (36.4 C) (Other (Comment))   Resp 18   Ht _0  (1.702 m)   Wt 290 lb (131.5 kg)   SpO2 98%   BMI 45.42 kg/m  Vitals:   08/15/19 1058 08/15/19 1114  BP: (!) 172/100 (!) 169/75  Pulse: (!) 54 (!) 54  Resp: 18   Temp: (!) 97.5 F (36.4 C)   TempSrc: Other (Comment)   SpO2: 98%   Weight: 290 lb (131.5 kg)   Height: _1  (1.702 m)   Body mass index is 45.42 kg/m.   Physical Exam Vitals reviewed.  Constitutional:      Appearance: He is well-developed.     Comments: Obese white male in no acute distress  HENT:     Head: Normocephalic and atraumatic.     Right Ear: External ear normal.     Left Ear: External ear normal.     Nose: Nose normal.  Eyes:     General: No scleral icterus.    Conjunctiva/sclera: Conjunctivae normal.  Neck:     Thyroid: No thyromegaly.     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     Heart sounds: Normal heart sounds.  Pulmonary:     Effort: Pulmonary effort is normal.     Breath sounds: Normal breath sounds.  Abdominal:     General: Bowel sounds are normal. There is no distension.     Tenderness: There is no abdominal tenderness.     Comments: Midline scar is surrounded by can very firm scar tissue probably 3 inches on either side of the scar.  Skin:    General: Skin is warm and dry.  Neurological:     Mental Status: He is alert and oriented to person, place, and time.     Cranial Nerves: No cranial nerve deficit.     Sensory: No sensory deficit.      Motor: No abnormal muscle tone.  Psychiatric:        Mood and Affect: Mood normal.        Behavior: Behavior normal.        Thought Content: Thought content normal.        Judgment: Judgment normal.      No results found for any visits on 08/15/19.     Assessment & Plan  1. Essential (primary) hypertension Add amlodipine 5 mg daily. [facility] 1 month - CBC with Differential/Platelet - Comprehensive metabolic panel - TSH  2. Class 3 severe obesity due to excess calories with serious comorbidity and body mass index (BMI) of 45.0 to 49.9 in adult (HCC)  - CBC with Differential/Platelet - Comprehensive metabolic panel - TSH  3. Anemia, unspecified type  - CBC with Differential/Platelet - Comprehensive metabolic panel - TSH  4. Recurrent major depressive disorder, in partial remission [facility]) Seeing psychiatrist.     [name], MD  [facility]

## 2019-08-15 ENCOUNTER — Ambulatory Visit (INDEPENDENT_AMBULATORY_CARE_PROVIDER_SITE_OTHER): Payer: Managed Care, Other (non HMO) | Admitting: Family Medicine

## 2019-08-15 ENCOUNTER — Encounter: Payer: Self-pay | Admitting: Family Medicine

## 2019-08-15 VITALS — BP 169/75 | HR 54 | Temp 97.5°F | Resp 18 | Ht 67.0 in | Wt 290.0 lb

## 2019-08-15 DIAGNOSIS — D649 Anemia, unspecified: Secondary | ICD-10-CM

## 2019-08-15 DIAGNOSIS — I10 Essential (primary) hypertension: Secondary | ICD-10-CM | POA: Diagnosis not present

## 2019-08-15 DIAGNOSIS — F3341 Major depressive disorder, recurrent, in partial remission: Secondary | ICD-10-CM

## 2019-08-15 DIAGNOSIS — Z6841 Body Mass Index (BMI) 40.0 and over, adult: Secondary | ICD-10-CM

## 2019-08-15 MED ORDER — AMLODIPINE BESYLATE 5 MG PO TABS: 5.0000 mg | ORAL_TABLET | Freq: Every day | ORAL | 1 refills | Status: DC

## 2019-08-16 LAB — COMPREHENSIVE METABOLIC PANEL
ALT: 20 IU/L (ref 0–44)
AST: 24 IU/L (ref 0–40)
Albumin/Globulin Ratio: 1.5 (ref 1.2–2.2)
Albumin: 4.1 g/dL (ref 3.8–4.8)
Alkaline Phosphatase: 134 IU/L — ABNORMAL HIGH (ref 39–117)
BUN/Creatinine Ratio: 22 (ref 10–24)
BUN: 18 mg/dL (ref 8–27)
Bilirubin Total: 0.3 mg/dL (ref 0.0–1.2)
CO2: 26 mmol/L (ref 20–29)
Calcium: 9 mg/dL (ref 8.6–10.2)
Chloride: 104 mmol/L (ref 96–106)
Creatinine, Ser: 0.82 mg/dL (ref 0.76–1.27)
GFR calc Af Amer: 110 mL/min/{1.73_m2} (ref >59)
GFR calc non Af Amer: 95 mL/min/{1.73_m2} (ref >59)
Globulin, Total: 2.7 g/dL (ref 1.5–4.5)
Glucose: 86 mg/dL (ref 65–99)
Potassium: 3.7 mmol/L (ref 3.5–5.2)
Sodium: 142 mmol/L (ref 134–144)
Total Protein: 6.8 g/dL (ref 6.0–8.5)

## 2019-08-16 LAB — CBC WITH DIFFERENTIAL/PLATELET
Basophils Absolute: 0 10*3/uL (ref 0.0–0.2)
Basos: 1 %
EOS (ABSOLUTE): 0.1 10*3/uL (ref 0.0–0.4)
Eos: 2 %
Hematocrit: 42.1 % (ref 37.5–51.0)
Hemoglobin: 14.4 g/dL (ref 13.0–17.7)
Immature Grans (Abs): 0 10*3/uL (ref 0.0–0.1)
Immature Granulocytes: 0 %
Lymphocytes Absolute: 1.9 10*3/uL (ref 0.7–3.1)
Lymphs: 34 %
MCH: 29.7 pg (ref 26.6–33.0)
MCHC: 34.2 g/dL (ref 31.5–35.7)
MCV: 87 fL (ref 79–97)
Monocytes Absolute: 0.4 10*3/uL (ref 0.1–0.9)
Monocytes: 7 %
Neutrophils Absolute: 3.1 10*3/uL (ref 1.4–7.0)
Neutrophils: 56 %
Platelets: 190 10*3/uL (ref 150–450)
RBC: 4.85 x10E6/uL (ref 4.14–5.80)
RDW: 12.7 % (ref 11.6–15.4)
WBC: 5.5 10*3/uL (ref 3.4–10.8)

## 2019-08-16 LAB — TSH: TSH: 3.06 u[IU]/mL (ref 0.450–4.500)

## 2019-08-20 ENCOUNTER — Telehealth: Payer: Self-pay

## 2019-08-20 NOTE — Telephone Encounter (Signed)
-----   Message from Jerrol Banana., MD sent at 08/19/2019  6:00 PM EST ----- Labs stable.

## 2019-08-20 NOTE — Telephone Encounter (Signed)
Patient was advised.  

## 2019-09-11 NOTE — Progress Notes (Signed)
Patient: Peter [name] Male    DOB: [birth year]   59 y.o.   MRN: [id number] Visit Date: 09/13/2019  Today's Provider: [name], MD   Chief Complaint  Patient presents with  . Follow-up  . Hypertension   Subjective:     HPI  He is tolerating amlodipine well.  He is feeling okay.  He has a job interview on Monday in hopes that will work out. Essential (primary) hypertension From 08/15/2019-Added amlodipine 5 mg daily. follow up  in 1 month. Labs checked showing-stable.  Patient blood pressure at home has been averaging around 140/90-150/90.   Allergies  Allergen Reactions  . Oxycodone Other (See Comments)    Causes Hallucinations, auditory and tactile  . Isosorbide Nausea And Vomiting    Headache, nausea, vomiting     Current Outpatient Medications:  .  ALPRAZolam (XANAX) 0.5 MG tablet, Take 1 tablet (0.5 mg total) by mouth 2 (two) times daily as needed for anxiety., Disp: 60 tablet, Rfl: 1 .  amLODipine (NORVASC) 5 MG tablet, Take 1 tablet (5 mg total) by mouth daily., Disp: 30 tablet, Rfl: 1 .  aspirin EC 81 MG tablet, Take 81 mg by mouth daily., Disp: , Rfl:  .  atorvastatin (LIPITOR) 40 MG tablet, Take 1 tablet (40 mg total) by mouth daily. (Patient taking differently: Take 40 mg by mouth daily at 6 PM. ), Disp: 90 tablet, Rfl: 3 .  azelastine (ASTELIN) 0.1 % nasal spray, SMARTSIG:1-2 Spray(s) Both Nares Twice Daily, Disp: , Rfl:  .  b complex vitamins capsule, Take 1 capsule by mouth daily., Disp: 30 capsule, Rfl: 11 .  carbamazepine (TEGRETOL) 200 MG tablet, 3 po q am, 2 qhs., Disp: 450 tablet, Rfl: 1 .  eplerenone (INSPRA) 50 MG tablet, Take 50 mg by mouth daily., Disp: , Rfl:  .  fluticasone (FLONASE) 50 MCG/ACT nasal spray, SMARTSIG:1-2 Spray(s) Both Nares Daily, Disp: , Rfl:  .  hydrALAZINE (APRESOLINE) 100 MG tablet, Take 1 tablet (100 mg total) by mouth 3 (three) times daily., Disp: 270 tablet, Rfl: 3 .  ibuprofen (ADVIL) 800 MG tablet, , Disp: ,  Rfl:  .  lamoTRIgine (LAMICTAL) 200 MG tablet, Take 1.5 tablets (300 mg total) by mouth at bedtime., Disp: 135 tablet, Rfl: 1 .  latanoprost (XALATAN) 0.005 % ophthalmic solution, Apply to eye., Disp: , Rfl:  .  losartan (COZAAR) 100 MG tablet, Take 1 tablet (100 mg total) by mouth daily., Disp: 90 tablet, Rfl: 3 .  metoprolol tartrate (LOPRESSOR) 50 MG tablet, Take 1 tablet (50 mg total) by mouth 2 (two) times daily., Disp: 180 tablet, Rfl: 3 .  NON FORMULARY, CPAP, Disp: , Rfl:  .  Omega-3 Fatty Acids (FISH OIL) 1000 MG CAPS, Take by mouth., Disp: , Rfl:  .  omeprazole (PRILOSEC) 40 MG capsule, Take 1 capsule (40 mg total) by mouth daily before breakfast., Disp: 30 capsule, Rfl: 3 .  sertraline (ZOLOFT) 100 MG tablet, Take 1 tablet (100 mg total) by mouth daily. (Patient taking differently: Take 100 mg by mouth 2 (two) times daily. ), Disp: 90 tablet, Rfl: 3 .  vitamin B-12 (CYANOCOBALAMIN) 1000 MCG tablet, , Disp: , Rfl:  .  cetirizine (ZYRTEC) 10 MG tablet, Take 10 mg by mouth daily., Disp: , Rfl:  .  cimetidine (TAGAMET) 400 MG tablet, Take 1 tablet (400 mg total) by mouth 2 (two) times daily., Disp: 90 tablet, Rfl: 1 .  LUMIGAN 0.01 % SOLN, Place  1 drop into both eyes every morning. , Disp: , Rfl: 7 .  montelukast (SINGULAIR) 10 MG tablet, Take 10 mg by mouth at bedtime., Disp: , Rfl:  .  NARCAN 4 MG/0.1ML LIQD nasal spray kit, USE 1 SPRAY IEN UTD PRN, Disp: , Rfl:  .  UNABLE TO FIND, 900 mg by Per post-pyloric tube route. Med Name: Kyolic, Disp: , Rfl:   Review of Systems  Constitutional: Negative for appetite change, chills and fever.  HENT: Negative.   Eyes: Negative.   Respiratory: Negative for chest tightness, shortness of breath and wheezing.   Cardiovascular: Negative for chest pain and palpitations.  Gastrointestinal: Negative for abdominal pain, nausea and vomiting.  Endocrine: Negative.   Allergic/Immunologic: Negative.   Hematological: Negative.   Psychiatric/Behavioral:  Negative.     Social History   Tobacco Use  . Smoking status: Never Smoker  . Smokeless tobacco: Never Used  Substance Use Topics  . Alcohol use: Not Currently    Comment: occasionally      Objective:   BP (!) 173/99 (BP Location: Left Arm, Patient Position: Sitting, Cuff Size: Large)   Pulse (!) 57   Temp (!) 97.3 F (36.3 C) (Other (Comment))   Resp 18   Ht '5\' 7"'$  (1.702 m)   Wt 290 lb (131.5 kg)   SpO2 96%   BMI 45.42 kg/m  Vitals:   09/13/19 0926  BP: (!) 173/99  Pulse: (!) 57  Resp: 18  Temp: (!) 97.3 F (36.3 C)  TempSrc: Other (Comment)  SpO2: 96%  Weight: 290 lb (131.5 kg)  Height: '5\' 7"'$  (1.702 m)  Body mass index is 45.42 kg/m.   Physical Exam Vitals reviewed.  Constitutional:      Appearance: He is well-developed.     Comments: Obese white male in no acute distress  HENT:     Head: Normocephalic and atraumatic.     Right Ear: External ear normal.     Left Ear: External ear normal.     Nose: Nose normal.  Eyes:     General: No scleral icterus.    Conjunctiva/sclera: Conjunctivae normal.  Neck:     Thyroid: No thyromegaly.     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     Heart sounds: Normal heart sounds.  Pulmonary:     Effort: Pulmonary effort is normal.     Breath sounds: Normal breath sounds.  Abdominal:     General: Bowel sounds are normal. There is no distension.     Tenderness: There is no abdominal tenderness.     Comments: Midline scar is surrounded by can very firm scar tissue probably 3 inches on either side of the scar.  Skin:    General: Skin is warm and dry.  Neurological:     Mental Status: He is alert and oriented to person, place, and time.     Cranial Nerves: No cranial nerve deficit.     Sensory: No sensory deficit.     Motor: No abnormal muscle tone.  Psychiatric:        Mood and Affect: Mood normal.        Behavior: Behavior  normal.        Thought Content: Thought content normal.        Judgment: Judgment normal.      No results found for any visits on 09/13/19.     Assessment & Plan  1. Essential (primary) hypertension Increased amlodipine from 5 mg to 10 mg daily. Return to clinic 1 to 2 months.  Asked him to bring in his cuff from home to see how it reads compared to ours. - amLODipine (NORVASC) 10 MG tablet; Take 1 tablet (10 mg total) by mouth daily.  Dispense: 30 tablet; Refill:   2. Class 3 severe obesity due to excess calories with serious comorbidity and body mass index (BMI) of 45.0 to 49.9 in adult ([place])   3. Recurrent major depressive disorder, in partial remission ([place]) Stable presently.   Follow up in 2 months.     I,Mahlia Fernando,acting as a scribe for [name], MD.,have documented all relevant documentation on the behalf of [name], MD,as directed by  [name], MD while in the presence of [name], MD.      [name], MD  [facility]

## 2019-09-13 ENCOUNTER — Encounter: Payer: Self-pay | Admitting: Family Medicine

## 2019-09-13 ENCOUNTER — Other Ambulatory Visit: Payer: Self-pay

## 2019-09-13 ENCOUNTER — Ambulatory Visit (INDEPENDENT_AMBULATORY_CARE_PROVIDER_SITE_OTHER): Payer: Managed Care, Other (non HMO) | Admitting: Family Medicine

## 2019-09-13 VITALS — BP 171/105 | HR 54 | Temp 97.3°F | Resp 18 | Ht 67.0 in | Wt 290.0 lb

## 2019-09-13 DIAGNOSIS — F3341 Major depressive disorder, recurrent, in partial remission: Secondary | ICD-10-CM | POA: Diagnosis not present

## 2019-09-13 DIAGNOSIS — Z6841 Body Mass Index (BMI) 40.0 and over, adult: Secondary | ICD-10-CM

## 2019-09-13 DIAGNOSIS — I10 Essential (primary) hypertension: Secondary | ICD-10-CM | POA: Diagnosis not present

## 2019-09-13 MED ORDER — AMLODIPINE BESYLATE 10 MG PO TABS: 10.0000 mg | ORAL_TABLET | Freq: Every day | ORAL | 1 refills | Status: DC

## 2019-09-18 ENCOUNTER — Ambulatory Visit (INDEPENDENT_AMBULATORY_CARE_PROVIDER_SITE_OTHER): Payer: 59 | Admitting: Physician Assistant

## 2019-09-18 ENCOUNTER — Other Ambulatory Visit: Payer: Self-pay

## 2019-09-18 ENCOUNTER — Encounter: Payer: Self-pay | Admitting: Physician Assistant

## 2019-09-18 DIAGNOSIS — F319 Bipolar disorder, unspecified: Secondary | ICD-10-CM

## 2019-09-18 DIAGNOSIS — F411 Generalized anxiety disorder: Secondary | ICD-10-CM

## 2019-09-18 DIAGNOSIS — F4323 Adjustment disorder with mixed anxiety and depressed mood: Secondary | ICD-10-CM

## 2019-09-18 NOTE — Progress Notes (Signed)
[facility] Med Check  Patient ID: Peter [name],  MRN: [id number]  PCP: Jerrol Banana., MD  Date of Evaluation: 09/18/2019 Time spent:20 minutes  Chief Complaint:  Chief Complaint    Anxiety; Depression      HISTORY/CURRENT STATUS: HPI for routine med check.  Accompanied by wife Howie Ill he is doing well.  He has had some circumstantial anxiety recently with a job interview at [employer].  He is very hopeful and excited that he will get this position.  The interview went well and he will find out later this week whether he got the job or not.  At the last visit we started Xanax.  He has needed it some but not so much recently.  It has been helpful when he has taken it.  We also increased the Lamictal at the last visit.  He is feeling better as far as depression goes.  It is a little bit hard to tell how much because of this recent job interview and the excitement there.  But he is happy, able to enjoy things, appetite is stable, energy and motivation are good.  Sleeps pretty good most of the time.  No weight gain or loss, no suicidal or homicidal thoughts.  Denies increased energy with decreased need for sleep, no impulsivity, risky behavior, increased libido, or spending.  No grandiosity.  No hallucinations.  Denies dizziness, syncope, seizures, numbness, tingling, tremor, tics, unsteady gait, slurred speech, confusion. Denies muscle or joint pain, stiffness, or dystonia.  Individual Medical History/ Review of Systems: Changes? :No    Past medications for mental health diagnoses include: Zoloft, Cymbalta did not help, Pristiq did not help, Seroquel, Xanax, Depakote, Lamictal, Equetro, Klonopin  Allergies: Oxycodone and Isosorbide  Current Medications:  Current Outpatient Medications:  .  ALPRAZolam (XANAX) 0.5 MG tablet, Take 1 tablet (0.5 mg total) by mouth 2 (two) times daily as needed for anxiety., Disp: 60 tablet, Rfl: 1 .  amLODipine  (NORVASC) 10 MG tablet, Take 1 tablet (10 mg total) by mouth daily., Disp: 30 tablet, Rfl: 1 .  aspirin EC 81 MG tablet, Take 81 mg by mouth daily., Disp: , Rfl:  .  atorvastatin (LIPITOR) 40 MG tablet, Take 1 tablet (40 mg total) by mouth daily. (Patient taking differently: Take 40 mg by mouth daily at 6 PM. ), Disp: 90 tablet, Rfl: 3 .  azelastine (ASTELIN) 0.1 % nasal spray, SMARTSIG:1-2 Spray(s) Both Nares Twice Daily, Disp: , Rfl:  .  b complex vitamins capsule, Take 1 capsule by mouth daily., Disp: 30 capsule, Rfl: 11 .  carbamazepine (TEGRETOL) 200 MG tablet, 3 po q am, 2 qhs., Disp: 450 tablet, Rfl: 1 .  cetirizine (ZYRTEC) 10 MG tablet, Take 10 mg by mouth daily., Disp: , Rfl:  .  eplerenone (INSPRA) 50 MG tablet, Take 50 mg by mouth daily., Disp: , Rfl:  .  fluticasone (FLONASE) 50 MCG/ACT nasal spray, SMARTSIG:1-2 Spray(s) Both Nares Daily, Disp: , Rfl:  .  hydrALAZINE (APRESOLINE) 100 MG tablet, Take 1 tablet (100 mg total) by mouth 3 (three) times daily., Disp: 270 tablet, Rfl: 3 .  ibuprofen (ADVIL) 800 MG tablet, , Disp: , Rfl:  .  lamoTRIgine (LAMICTAL) 200 MG tablet, Take 1.5 tablets (300 mg total) by mouth at bedtime., Disp: 135 tablet, Rfl: 1 .  latanoprost (XALATAN) 0.005 % ophthalmic solution, Apply to eye., Disp: , Rfl:  .  losartan (COZAAR) 100 MG tablet, Take 1 tablet (100 mg total) by  mouth daily., Disp: 90 tablet, Rfl: 3 .  metoprolol tartrate (LOPRESSOR) 50 MG tablet, Take 1 tablet (50 mg total) by mouth 2 (two) times daily., Disp: 180 tablet, Rfl: 3 .  NON FORMULARY, CPAP, Disp: , Rfl:  .  Omega-3 Fatty Acids (FISH OIL) 1000 MG CAPS, Take by mouth., Disp: , Rfl:  .  omeprazole (PRILOSEC) 40 MG capsule, Take 1 capsule (40 mg total) by mouth daily before breakfast., Disp: 30 capsule, Rfl: 3 .  sertraline (ZOLOFT) 100 MG tablet, Take 1 tablet (100 mg total) by mouth daily. (Patient taking differently: Take 100 mg by mouth 2 (two) times daily. ), Disp: 90 tablet, Rfl: 3 .   vitamin B-12 (CYANOCOBALAMIN) 1000 MCG tablet, , Disp: , Rfl:  .  cimetidine (TAGAMET) 400 MG tablet, Take 1 tablet (400 mg total) by mouth 2 (two) times daily., Disp: 90 tablet, Rfl: 1 .  LUMIGAN 0.01 % SOLN, Place 1 drop into both eyes every morning. , Disp: , Rfl: 7 .  montelukast (SINGULAIR) 10 MG tablet, Take 10 mg by mouth at bedtime., Disp: , Rfl:  .  NARCAN 4 MG/0.1ML LIQD nasal spray kit, USE 1 SPRAY IEN UTD PRN, Disp: , Rfl:  .  UNABLE TO FIND, 900 mg by Per post-pyloric tube route. Med Name: Kyolic, Disp: , Rfl:  Medication Side Effects: none  Family Medical/ Social History: Changes? Yes see above  MENTAL HEALTH EXAM:  There were no vitals taken for this visit.There is no height or weight on file to calculate BMI.  General Appearance: Casual, Neat, Well Groomed and Obese  Eye Contact:  Good  Speech:  Clear and Coherent and Normal Rate  Volume:  Normal  Mood:  Euthymic  Affect:  Appropriate  Thought Process:  Goal Directed and Descriptions of Associations: Intact  Orientation:  Full (Time, Place, and Person)  Thought Content: Logical   Suicidal Thoughts:  No  Homicidal Thoughts:  No  Memory:  WNL  Judgement:  Good  Insight:  Good  Psychomotor Activity:  Normal  Concentration:  Concentration: Good  Recall:  Good  Fund of Knowledge: Good  Language: Good  Assets:  Desire for Improvement  ADL's:  Intact  Cognition: WNL  Prognosis:  Good    DIAGNOSES:    ICD-10-CM   1. Bipolar I disorder ([place])  F31.9   2. Adjustment disorder with mixed anxiety and depressed mood  F43.23   3. Generalized anxiety disorder  F41.1     Receiving Psychotherapy: No    RECOMMENDATIONS:  PDMP was reviewed. I spent 20 minutes with him and his wife. I am happy for him about the possibility of his job and I wish him good luck! Continue Xanax 0.5 mg, 1 p.o. twice daily as needed. Continue carbamazepine 200 mg, 3 p.o. every morning, 2 p.o. nightly. Continue Lamictal 200 mg, 1.5 pills q.  at bedtime. Continue Zoloft 100 mg, 1 p.o. twice daily. Continue multivitamin, B complex, omega-3 Return in 3 months.  If he is doing great and is in his new job by that time and unable to take off, and he can wait until 6 months.  Donnal Moat, PA-C

## 2019-09-26 ENCOUNTER — Other Ambulatory Visit: Payer: Self-pay | Admitting: Physician Assistant

## 2019-09-27 NOTE — Telephone Encounter (Signed)
Next apt 12/19/2019

## 2019-10-26 ENCOUNTER — Other Ambulatory Visit: Payer: Self-pay | Admitting: Physician Assistant

## 2019-11-13 NOTE — Progress Notes (Deleted)
Established patient visit   Patient: Peter [name]   DOB: Aug 20, 1960   59 y.o. Male  MRN: [id number] Visit Date: 11/14/2019  I,Lanorris Kalisz S Damira Kem,acting as a scribe for [name], MD.,have documented all relevant documentation on the behalf of [name], MD,as directed by  [name], MD while in the presence of [name], MD.  Today's healthcare provider: [name], MD   Chief Complaint  Patient presents with  . Hypertension   Subjective    HPI   Hypertension, follow-up  BP Readings from Last 3 Encounters:  11/14/19 136/73  09/13/19 (!) 171/105  08/15/19 (!) 169/75   Wt Readings from Last 3 Encounters:  11/14/19 298 lb 6.4 oz (135.4 kg)  09/13/19 290 lb (131.5 kg)  08/15/19 290 lb (131.5 kg)     He was last seen for hypertension 2 months ago.  BP at that visit was 171/105.     Management since that visit includes; Increased amlodipine from 5 mg to 10 mg daily. Return to clinic 1 to 2 months. Asked him to bring in his bp cuff from home to see how it reads compared to ours. He reports excellent compliance with treatment. He is not having side effects.  He is not exercising. He is adherent to low salt diet.   Outside blood pressures are not being checked.  He does not smoke.  Use of agents associated with hypertension: none.   ---------------------------------------------------------------------------------------------------  Patient Active Problem List   Diagnosis Date Noted  . Dysphagia   . Colon cancer screening   . Mild cognitive impairment 06/04/2019  . History of arthroplasty of left shoulder 03/26/2019  . History of total knee arthroplasty 03/21/2019  . GERD (gastroesophageal reflux disease) 12/16/2018  . Erectile dysfunction 09/14/2017  . Open abdominal incision with drainage   . 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 Necrotizing soft tissue infection 11/11/2016  . Decreased libido 09/21/2016  . Shortness  of breath 06/15/2016  . Abnormal stress test 06/15/2016  . 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 Avitaminosis D 03/25/2015  . Blood in stool   . Clinical depression 06/21/2011  . Cannot sleep 08/22/2009  . Hypercholesterolemia without hypertriglyceridemia 01/28/2009  . Hay fever 10/22/2008  . Adjustment disorder with mixed anxiety and depressed mood 12/12/2007  . Essential (primary) hypertension 12/12/2007  . Apnea, sleep 12/12/2007   Social History   Tobacco Use  . Smoking status: Never Smoker  . Smokeless tobacco: Never Used  Substance Use Topics  . Alcohol use: Not Currently    Comment: occasionally  . Drug use: No       Medications: Outpatient Medications Prior to Visit  Medication Sig  . ALPRAZolam (XANAX) 0.5 MG tablet TAKE 1 TABLET(0.5 MG) BY MOUTH TWICE DAILY AS NEEDED FOR ANXIETY  . amLODipine (NORVASC) 10 MG tablet Take 1 tablet (10 mg total) by mouth daily.  Marland Kitchen aspirin EC 81 MG tablet Take 81 mg by mouth daily.  Marland Kitchen atorvastatin (LIPITOR) 40 MG tablet Take 1 tablet (40 mg total) by mouth daily. (Patient taking differently: Take 40 mg by mouth daily at 6 PM. )  . azelastine (ASTELIN) 0.1 % nasal spray SMARTSIG:1-2 Spray(s) Both Nares Twice Daily  . b complex vitamins capsule Take 1 capsule by mouth daily.  . carbamazepine (TEGRETOL) 200 MG tablet TAKE 3 TABLETS BY MOUTH EVERY MORNING AND 2 TABLETS BY MOUTH EVERY NIGHT AT BEDTIME  . eplerenone (INSPRA) 50 MG tablet Take 50 mg by  mouth daily.  . fluticasone (FLONASE) 50 MCG/ACT nasal spray SMARTSIG:1-2 Spray(s) Both Nares Daily  . hydrALAZINE (APRESOLINE) 100 MG tablet Take 1 tablet (100 mg total) by mouth 3 (three) times daily.  Marland Kitchen ibuprofen (ADVIL) 800 MG tablet   . lamoTRIgine (LAMICTAL) 200 MG tablet Take 1.5 tablets (300 mg total) by mouth at bedtime.  Marland Kitchen latanoprost (XALATAN) 0.005 % ophthalmic solution Apply to eye.  . losartan (COZAAR) 100 MG tablet Take 1 tablet (100 mg total) by mouth daily.  . metoprolol  tartrate (LOPRESSOR) 50 MG tablet Take 1 tablet (50 mg total) by mouth 2 (two) times daily.  . montelukast (SINGULAIR) 10 MG tablet Take 10 mg by mouth at bedtime.  . NON FORMULARY CPAP  . Omega-3 Fatty Acids (FISH OIL) 1000 MG CAPS Take by mouth.  Marland Kitchen omeprazole (PRILOSEC) 40 MG capsule Take 1 capsule (40 mg total) by mouth daily before breakfast.  . sertraline (ZOLOFT) 100 MG tablet Take 1 tablet (100 mg total) by mouth daily. (Patient taking differently: Take 100 mg by mouth 2 (two) times daily. )  . UNABLE TO FIND 900 mg by Per post-pyloric tube route. Med Name: Kyolic  . vitamin Y-78 (CYANOCOBALAMIN) 1000 MCG tablet   . NARCAN 4 MG/0.1ML LIQD nasal spray kit USE 1 SPRAY IEN UTD PRN  . [DISCONTINUED] cetirizine (ZYRTEC) 10 MG tablet Take 10 mg by mouth daily.  . [DISCONTINUED] cimetidine (TAGAMET) 400 MG tablet Take 1 tablet (400 mg total) by mouth 2 (two) times daily.  . [DISCONTINUED] LUMIGAN 0.01 % SOLN Place 1 drop into both eyes every morning.    No facility-administered medications prior to visit.    Review of Systems  Constitutional: Negative for appetite change, chills and fever.  Respiratory: Negative for chest tightness, shortness of breath and wheezing.   Cardiovascular: Positive for leg swelling. Negative for chest pain and palpitations.  Gastrointestinal: Negative for abdominal pain, nausea and vomiting.    Last metabolic panel Lab Results  Component Value Date   GLUCOSE 86 08/15/2019   NA 142 08/15/2019   K 3.7 08/15/2019   CL 104 08/15/2019   CO2 26 08/15/2019   BUN 18 08/15/2019   CREATININE 0.82 08/15/2019   GFRNONAA 95 08/15/2019   GFRAA 110 08/15/2019   CALCIUM 9.0 08/15/2019   PHOS 3.8 12/06/2017   PROT 6.8 08/15/2019   ALBUMIN 4.1 08/15/2019   LABGLOB 2.7 08/15/2019   AGRATIO 1.5 08/15/2019   BILITOT 0.3 08/15/2019   ALKPHOS 134 (H) 08/15/2019   AST 24 08/15/2019   ALT 20 08/15/2019   ANIONGAP 6 03/22/2019      Objective    BP 136/73   Pulse  (!) 59   Temp (!) 97.3 F (36.3 C) (Temporal)   Resp 16   Ht '5\' 7"'$  (1.702 m)   Wt 298 lb 6.4 oz (135.4 kg)   SpO2 97%   BMI 46.74 kg/m  BP Readings from Last 3 Encounters:  11/14/19 136/73  09/13/19 (!) 171/105  08/15/19 (!) 169/75   Wt Readings from Last 3 Encounters:  11/14/19 298 lb 6.4 oz (135.4 kg)  09/13/19 290 lb (131.5 kg)  08/15/19 290 lb (131.5 kg)      Physical Exam   Depression screen [facility] 2/9 11/14/2019 06/06/2018 09/21/2016 03/25/2015  Decreased Interest 0 0 0 0  Down, Depressed, Hopeless 0 0 0 0  PHQ - 2 Score 0 0 0 0  Altered sleeping 0 0 0 -  Tired, decreased energy 0 1 3 -  Change in appetite 0 0 0 -  Feeling bad or failure about yourself  0 0 0 -  Trouble concentrating 0 0 0 -  Moving slowly or fidgety/restless 0 0 0 -  Suicidal thoughts 0 0 0 -  PHQ-9 Score 0 1 3 -  Difficult doing work/chores Not difficult at all Not difficult at all - -   Fall Risk  03/25/2015  Falls in the past year? No     Functional Status Survey: Is the patient deaf or have difficulty hearing?: No Does the patient have difficulty seeing, even when wearing glasses/contacts?: No Does the patient have difficulty concentrating, remembering, or making decisions?: No Does the patient have difficulty walking or climbing stairs?: No Does the patient have difficulty dressing or bathing?: No Does the patient have difficulty doing errands alone such as visiting a doctor's office or shopping?: No     Office Visit from 06/06/2018 in [facility]  AUDIT-C Score  2         No results found for any visits on 11/14/19.  Assessment & Plan       No follow-ups on file.      {provider attestation***:1}   [name], MD  [facility] [phone] (phone) [phone] (fax)  [place]

## 2019-11-14 ENCOUNTER — Encounter: Payer: Self-pay | Admitting: Family Medicine

## 2019-11-14 ENCOUNTER — Other Ambulatory Visit: Payer: Self-pay

## 2019-11-14 ENCOUNTER — Ambulatory Visit (INDEPENDENT_AMBULATORY_CARE_PROVIDER_SITE_OTHER): Payer: Managed Care, Other (non HMO) | Admitting: Family Medicine

## 2019-11-14 VITALS — BP 136/73 | HR 59 | Temp 97.3°F | Resp 16 | Ht 67.0 in | Wt 298.4 lb

## 2019-11-14 DIAGNOSIS — F3341 Major depressive disorder, recurrent, in partial remission: Secondary | ICD-10-CM | POA: Diagnosis not present

## 2019-11-14 DIAGNOSIS — Z6841 Body Mass Index (BMI) 40.0 and over, adult: Secondary | ICD-10-CM | POA: Diagnosis not present

## 2019-11-14 DIAGNOSIS — I10 Essential (primary) hypertension: Secondary | ICD-10-CM | POA: Diagnosis not present

## 2019-11-14 DIAGNOSIS — F319 Bipolar disorder, unspecified: Secondary | ICD-10-CM | POA: Insufficient documentation

## 2019-11-14 NOTE — Assessment & Plan Note (Signed)
Discussed importance of healthy weight management Discussed diet and exercise  

## 2019-11-14 NOTE — Assessment & Plan Note (Signed)
Doing well on increased Amlodipine  Well controlled today BP 136/73 Continue current medications Recheck metabolic panel F/u in 6 months

## 2019-11-14 NOTE — Assessment & Plan Note (Signed)
Stable.  Continue current medication

## 2019-11-14 NOTE — Patient Instructions (Signed)

## 2019-11-14 NOTE — Progress Notes (Signed)
Established patient visit   Patient: Peter [name]   DOB: July 18, 1960   59 y.o. Male  MRN: [id number] Visit Date: 11/14/2019  I,Sulibeya S Dimas,acting as a scribe for [name], MD.,have documented all relevant documentation on the behalf of [name], MD,as directed by  [name], MD while in the presence of [name], MD.  Today's healthcare provider: [name], MD   Chief Complaint  Patient presents with  . Hypertension   Subjective    He is tolerating the medication well and feels well.  He is excited to start a new job June 1.  His depression is stable. Hypertension, follow-up  BP Readings from Last 3 Encounters:  11/14/19 136/73  09/13/19 (!) 171/105  08/15/19 (!) 169/75   Wt Readings from Last 3 Encounters:  11/14/19 298 lb 6.4 oz (135.4 kg)  09/13/19 290 lb (131.5 kg)  08/15/19 290 lb (131.5 kg)     He was last seen for hypertension 2 months ago.  BP at that visit was 171/105.     Management since that visit includes; Increased amlodipine from 5 mg to 10 mg daily. He reports excellent compliance with treatment. He is not having side effects.  He is not exercising. He is adherent to low salt diet.   Outside blood pressures are not being checked.  He does not smoke.  Use of agents associated with hypertension: none.   ---------------------------------------------------------------------------------------------------  Patient Active Problem List   Diagnosis Date Noted  . Dysphagia   . Colon cancer screening   . Mild cognitive impairment 06/04/2019  . History of arthroplasty of left shoulder 03/26/2019  . History of total knee arthroplasty 03/21/2019  . GERD (gastroesophageal reflux disease) 12/16/2018  . Erectile dysfunction 09/14/2017  . Open abdominal incision with drainage   . 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 Necrotizing soft tissue infection 11/11/2016  . Decreased libido 09/21/2016  . Shortness  of breath 06/15/2016  . Abnormal stress test 06/15/2016  . 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 Avitaminosis D 03/25/2015  . Blood in stool   . Clinical depression 06/21/2011  . Cannot sleep 08/22/2009  . Hypercholesterolemia without hypertriglyceridemia 01/28/2009  . Hay fever 10/22/2008  . Adjustment disorder with mixed anxiety and depressed mood 12/12/2007  . Essential (primary) hypertension 12/12/2007  . Apnea, sleep 12/12/2007   Social History   Tobacco Use  . Smoking status: Never Smoker  . Smokeless tobacco: Never Used  Substance Use Topics  . Alcohol use: Not Currently    Comment: occasionally  . Drug use: No       Medications: Outpatient Medications Prior to Visit  Medication Sig  . ALPRAZolam (XANAX) 0.5 MG tablet TAKE 1 TABLET(0.5 MG) BY MOUTH TWICE DAILY AS NEEDED FOR ANXIETY  . amLODipine (NORVASC) 10 MG tablet Take 1 tablet (10 mg total) by mouth daily.  Marland Kitchen aspirin EC 81 MG tablet Take 81 mg by mouth daily.  Marland Kitchen atorvastatin (LIPITOR) 40 MG tablet Take 1 tablet (40 mg total) by mouth daily. (Patient taking differently: Take 40 mg by mouth daily at 6 PM. )  . azelastine (ASTELIN) 0.1 % nasal spray SMARTSIG:1-2 Spray(s) Both Nares Twice Daily  . b complex vitamins capsule Take 1 capsule by mouth daily.  . carbamazepine (TEGRETOL) 200 MG tablet TAKE 3 TABLETS BY MOUTH EVERY MORNING AND 2 TABLETS BY MOUTH EVERY NIGHT AT BEDTIME  . eplerenone (INSPRA) 50 MG tablet Take 50 mg by mouth daily.  Marland Kitchen  fluticasone (FLONASE) 50 MCG/ACT nasal spray SMARTSIG:1-2 Spray(s) Both Nares Daily  . hydrALAZINE (APRESOLINE) 100 MG tablet Take 1 tablet (100 mg total) by mouth 3 (three) times daily.  Marland Kitchen ibuprofen (ADVIL) 800 MG tablet   . lamoTRIgine (LAMICTAL) 200 MG tablet Take 1.5 tablets (300 mg total) by mouth at bedtime.  Marland Kitchen latanoprost (XALATAN) 0.005 % ophthalmic solution Apply to eye.  . losartan (COZAAR) 100 MG tablet Take 1 tablet (100 mg total) by mouth daily.  . metoprolol  tartrate (LOPRESSOR) 50 MG tablet Take 1 tablet (50 mg total) by mouth 2 (two) times daily.  . montelukast (SINGULAIR) 10 MG tablet Take 10 mg by mouth at bedtime.  . NON FORMULARY CPAP  . Omega-3 Fatty Acids (FISH OIL) 1000 MG CAPS Take by mouth.  Marland Kitchen omeprazole (PRILOSEC) 40 MG capsule Take 1 capsule (40 mg total) by mouth daily before breakfast.  . sertraline (ZOLOFT) 100 MG tablet Take 1 tablet (100 mg total) by mouth daily. (Patient taking differently: Take 100 mg by mouth 2 (two) times daily. )  . UNABLE TO FIND 900 mg by Per post-pyloric tube route. Med Name: Kyolic  . vitamin T-36 (CYANOCOBALAMIN) 1000 MCG tablet   . NARCAN 4 MG/0.1ML LIQD nasal spray kit USE 1 SPRAY IEN UTD PRN  . [DISCONTINUED] cetirizine (ZYRTEC) 10 MG tablet Take 10 mg by mouth daily.  . [DISCONTINUED] cimetidine (TAGAMET) 400 MG tablet Take 1 tablet (400 mg total) by mouth 2 (two) times daily.  . [DISCONTINUED] LUMIGAN 0.01 % SOLN Place 1 drop into both eyes every morning.    No facility-administered medications prior to visit.    Review of Systems  Constitutional: Negative for appetite change, chills and fever.  Eyes: Negative.   Respiratory: Negative for chest tightness, shortness of breath and wheezing.   Cardiovascular: Positive for leg swelling. Negative for chest pain and palpitations.  Gastrointestinal: Negative for abdominal pain, nausea and vomiting.  Endocrine: Negative.   Allergic/Immunologic: Negative.   Psychiatric/Behavioral: Negative.     Last metabolic panel Lab Results  Component Value Date   GLUCOSE 86 08/15/2019   NA 142 08/15/2019   K 3.7 08/15/2019   CL 104 08/15/2019   CO2 26 08/15/2019   BUN 18 08/15/2019   CREATININE 0.82 08/15/2019   GFRNONAA 95 08/15/2019   GFRAA 110 08/15/2019   CALCIUM 9.0 08/15/2019   PHOS 3.8 12/06/2017   PROT 6.8 08/15/2019   ALBUMIN 4.1 08/15/2019   LABGLOB 2.7 08/15/2019   AGRATIO 1.5 08/15/2019   BILITOT 0.3 08/15/2019   ALKPHOS 134 (H)  08/15/2019   AST 24 08/15/2019   ALT 20 08/15/2019   ANIONGAP 6 03/22/2019      Objective    BP 136/73   Pulse (!) 59   Temp (!) 97.3 F (36.3 C) (Temporal)   Resp 16   Ht '5\' 7"'$  (1.702 m)   Wt 298 lb 6.4 oz (135.4 kg)   SpO2 97%   BMI 46.74 kg/m  BP Readings from Last 3 Encounters:  11/14/19 136/73  09/13/19 (!) 171/105  08/15/19 (!) 169/75   Wt Readings from Last 3 Encounters:  11/14/19 298 lb 6.4 oz (135.4 kg)  09/13/19 290 lb (131.5 kg)  08/15/19 290 lb (131.5 kg)      Physical Exam Vitals reviewed.  Constitutional:      Appearance: He is well-developed.     Comments: Obese white male in no acute distress  HENT:     Head: Normocephalic and atraumatic.  Right Ear: External ear normal.     Left Ear: External ear normal.     Nose: Nose normal.  Eyes:     General: No scleral icterus.    Conjunctiva/sclera: Conjunctivae normal.  Neck:     Thyroid: No thyromegaly.  Cardiovascular:     Rate and Rhythm: Normal rate and regular rhythm.     Heart sounds: Normal heart sounds.  Pulmonary:     Effort: Pulmonary effort is normal.     Breath sounds: Normal breath sounds.  Abdominal:     General: Bowel sounds are normal. There is no distension.     Tenderness: There is no abdominal tenderness.  Musculoskeletal:     Right lower leg: No edema.     Left lower leg: No edema.  Skin:    General: Skin is warm and dry.  Neurological:     General: No focal deficit present.     Mental Status: He is alert and oriented to person, place, and time.     Cranial Nerves: No cranial nerve deficit.     Sensory: No sensory deficit.     Motor: No abnormal muscle tone.  Psychiatric:        Mood and Affect: Mood normal.        Behavior: Behavior normal.        Thought Content: Thought content normal.        Judgment: Judgment normal.      Depression screen [facility] 2/9 11/14/2019 06/06/2018 09/21/2016 03/25/2015  Decreased Interest 0 0 0 0  Down, Depressed, Hopeless 0 0 0 0  PHQ - 2  Score 0 0 0 0  Altered sleeping 0 0 0 -  Tired, decreased energy 0 1 3 -  Change in appetite 0 0 0 -  Feeling bad or failure about yourself  0 0 0 -  Trouble concentrating 0 0 0 -  Moving slowly or fidgety/restless 0 0 0 -  Suicidal thoughts 0 0 0 -  PHQ-9 Score 0 1 3 -  Difficult doing work/chores Not difficult at all Not difficult at all - -   Fall Risk  03/25/2015  Falls in the past year? No     Functional Status Survey: Is the patient deaf or have difficulty hearing?: No Does the patient have difficulty seeing, even when wearing glasses/contacts?: No Does the patient have difficulty concentrating, remembering, or making decisions?: No Does the patient have difficulty walking or climbing stairs?: No Does the patient have difficulty dressing or bathing?: No Does the patient have difficulty doing errands alone such as visiting a doctor's office or shopping?: No     Office Visit from 06/06/2018 in [facility]  AUDIT-C Score  2         No results found for any visits on 11/14/19.  Assessment & Plan    1. Essential (primary) hypertension Good control with addition of amlodipine '10mg'$ . CPE 4 to 6 months  2. Class 3 severe obesity due to excess calories with serious comorbidity and body mass index (BMI) of 45.0 to 49.9 in adult [facility]) Patient advised to work on diet and exercise habits.  3. Recurrent major depressive disorder, in partial remission ([place]) Followed by psychiatry.    No follow-ups on file.      I, [name], MD, have reviewed all documentation for this visit. The documentation on 11/14/19 for the exam, diagnosis, procedures, and orders are all accurate and complete.    [name], MD  [place] [phone] (phone) [phone] (fax)  [place]

## 2019-11-17 ENCOUNTER — Other Ambulatory Visit: Payer: Self-pay | Admitting: Family Medicine

## 2019-11-17 DIAGNOSIS — I10 Essential (primary) hypertension: Secondary | ICD-10-CM

## 2019-11-17 NOTE — Telephone Encounter (Signed)
Requested Prescriptions  Pending Prescriptions Disp Refills  . amLODipine (NORVASC) 10 MG tablet [Pharmacy Med Name: AMLODIPINE BESYLATE 10MG  TABLETS] 90 tablet 1    Sig: TAKE 1 TABLET(10 MG) BY MOUTH DAILY     Cardiovascular:  Calcium Channel Blockers Passed - 11/17/2019  1:38 PM      Passed - Last BP in normal range    BP Readings from Last 1 Encounters:  11/14/19 136/73         Passed - Valid encounter within last 6 months    Recent Outpatient Visits          3 days ago Essential (primary) hypertension   [facility] Jerrol Banana., MD   2 months ago Essential (primary) hypertension   [facility] Jerrol Banana., MD   3 months ago Essential (primary) hypertension   [facility] Jerrol Banana., MD   5 months ago Allergic rhinitis, unspecified seasonality, unspecified trigger   [place], [place]   6 months ago MCI (mild cognitive impairment)   [facility] Jerrol Banana., MD      Future Appointments            In 4 months Jerrol Banana., MD [facility], [place]

## 2019-12-03 ENCOUNTER — Other Ambulatory Visit: Payer: Self-pay | Admitting: Family Medicine

## 2019-12-03 DIAGNOSIS — I10 Essential (primary) hypertension: Secondary | ICD-10-CM

## 2019-12-04 ENCOUNTER — Other Ambulatory Visit: Payer: Self-pay

## 2019-12-04 MED ORDER — OMEPRAZOLE 40 MG PO CPDR: 40.0000 mg | DELAYED_RELEASE_CAPSULE | Freq: Every day | ORAL | 3 refills | Status: DC

## 2019-12-04 NOTE — Telephone Encounter (Signed)
Last office visit 08/14/2019 dysphagia  Last refill 08/14/2019 30 3 refills

## 2019-12-11 ENCOUNTER — Other Ambulatory Visit: Payer: Self-pay | Admitting: Family Medicine

## 2019-12-11 DIAGNOSIS — I10 Essential (primary) hypertension: Secondary | ICD-10-CM

## 2019-12-11 DIAGNOSIS — E7800 Pure hypercholesterolemia, unspecified: Secondary | ICD-10-CM

## 2019-12-11 NOTE — Telephone Encounter (Signed)
Requested medications are due for refill today?  Yes  Requested medications are on active medication list?  Yes  Last Refill:   01/29/2019  # 90 with 3 refills   Future visit scheduled?  Yes  Notes to Clinic:  Atorvastatin failed RX refill protocol due to no labs within the past 360 days.  The last lipid panel was performed on 06/06/2018.

## 2019-12-11 NOTE — Telephone Encounter (Signed)
Requested Prescriptions  Pending Prescriptions Disp Refills  . hydrALAZINE (APRESOLINE) 100 MG tablet [Pharmacy Med Name: HYDRALAZINE TABS 100MG ] 270 tablet 3    Sig: TAKE 1 TABLET THREE TIMES A DAY     Cardiovascular:  Vasodilators Passed - 12/11/2019  1:48 AM      Passed - HCT in normal range and within 360 days    Hematocrit  Date Value Ref Range Status  08/15/2019 42.1 37.5 - 51.0 % Final         Passed - HGB in normal range and within 360 days    Hemoglobin  Date Value Ref Range Status  08/15/2019 14.4 13.0 - 17.7 g/dL Final         Passed - RBC in normal range and within 360 days    RBC  Date Value Ref Range Status  08/15/2019 4.85 4.14 - 5.80 x10E6/uL Final  03/23/2019 4.10 (L) 4.22 - 5.81 MIL/uL Final         Passed - WBC in normal range and within 360 days    WBC  Date Value Ref Range Status  08/15/2019 5.5 3.4 - 10.8 x10E3/uL Final  03/23/2019 8.4 4.0 - 10.5 K/uL Final         Passed - PLT in normal range and within 360 days    Platelets  Date Value Ref Range Status  08/15/2019 190 150 - 450 x10E3/uL Final         Passed - Last BP in normal range    BP Readings from Last 1 Encounters:  11/14/19 136/73         Passed - Valid encounter within last 12 months    Recent Outpatient Visits          3 weeks ago Essential (primary) hypertension   [facility] Jerrol Banana., MD   2 months ago Essential (primary) hypertension   [facility] Jerrol Banana., MD   3 months ago Essential (primary) hypertension   [facility] Jerrol Banana., MD   6 months ago Allergic rhinitis, unspecified seasonality, unspecified trigger   [place], [place], [place]   6 months ago MCI (mild cognitive impairment)   [facility] Jerrol Banana., MD      Future Appointments            In 3 months Jerrol Banana., MD [facility]           .  atorvastatin (LIPITOR) 40 MG tablet [Pharmacy Med Name: ATORVASTATIN TABS 40MG ] 90 tablet 3    Sig: TAKE 1 TABLET DAILY     Cardiovascular:  Antilipid - Statins Failed - 12/11/2019  1:48 AM      Failed - Total Cholesterol in normal range and within 360 days    Cholesterol, Total  Date Value Ref Range Status  06/06/2018 160 100 - 199 mg/dL Final         Failed - LDL in normal range and within 360 days    LDL Cholesterol (Calc)  Date Value Ref Range Status  06/01/2017 50 mg/dL (calc) Final    Comment:    Reference range: <100 . Desirable range <100 mg/dL for primary prevention;   <70 mg/dL for patients with CHD or diabetic patients  with > or = 2 CHD risk factors. Marland Kitchen LDL-C is now calculated using the Martin-Hopkins  calculation, which is a validated novel method providing  better accuracy than the  Friedewald equation in the  estimation of LDL-C.  Cresenciano Genre et al. Annamaria Helling. 1937;902(40): 2061-2068  (http://education.[employer].com/faq/FAQ164)    LDL Calculated  Date Value Ref Range Status  06/06/2018 62 0 - 99 mg/dL Final         Failed - HDL in normal range and within 360 days    HDL  Date Value Ref Range Status  06/06/2018 79 >39 mg/dL Final         Failed - Triglycerides in normal range and within 360 days    Triglycerides  Date Value Ref Range Status  06/06/2018 97 0 - 149 mg/dL Final         Passed - Patient is not pregnant      Passed - Valid encounter within last 12 months    Recent Outpatient Visits          3 weeks ago Essential (primary) hypertension   [facility] Jerrol Banana., MD   2 months ago Essential (primary) hypertension   [facility] Jerrol Banana., MD   3 months ago Essential (primary) hypertension   [facility] Jerrol Banana., MD   6 months ago Allergic rhinitis, unspecified seasonality, unspecified trigger   [place], [place], [place]   6 months  ago MCI (mild cognitive impairment)   [facility] Jerrol Banana., MD      Future Appointments            In 3 months Jerrol Banana., MD [facility]           . metoprolol tartrate (LOPRESSOR) 50 MG tablet [Pharmacy Med Name: METOPROLOL TARTRATE TABS 50MG ] 180 tablet 3    Sig: TAKE 1 TABLET TWICE A DAY     Cardiovascular:  Beta Blockers Passed - 12/11/2019  1:48 AM      Passed - Last BP in normal range    BP Readings from Last 1 Encounters:  11/14/19 136/73         Passed - Last Heart Rate in normal range    Pulse Readings from Last 1 Encounters:  11/14/19 (!) 39         Passed - Valid encounter within last 6 months    Recent Outpatient Visits          3 weeks ago Essential (primary) hypertension   [place] Jerrol Banana., MD   2 months ago Essential (primary) hypertension   [facility] Jerrol Banana., MD   3 months ago Essential (primary) hypertension   [facility] Jerrol Banana., MD   6 months ago Allergic rhinitis, unspecified seasonality, unspecified trigger   [facility] [place], [place], [place]   6 months ago MCI (mild cognitive impairment)   [facility] Jerrol Banana., MD      Future Appointments            In 3 months Jerrol Banana., MD [facility]           . losartan (COZAAR) 100 MG tablet [Pharmacy Med Name: LOSARTAN TABS 100MG ] 90 tablet 3    Sig: TAKE 1 TABLET DAILY     Cardiovascular:  Angiotensin Receptor Blockers Passed - 12/11/2019  1:48 AM      Passed - Cr in normal range and within 180 days    Creat  Date Value Ref Range Status  06/21/2017 0.83 0.70 - 1.33 mg/dL Final    Comment:    For patients >41 years of age, the reference limit for Creatinine is approximately 13% higher for people identified as African-American. .    Creatinine, Ser  Date Value Ref  Range Status  08/15/2019 0.82 0.76 - 1.27 mg/dL Final         Passed - K in normal range and within 180 days    Potassium  Date Value Ref Range Status  08/15/2019 3.7 3.5 - 5.2 mmol/L Final         Passed - Patient is not pregnant      Passed - Last BP in normal range    BP Readings from Last 1 Encounters:  11/14/19 136/73         Passed - Valid encounter within last 6 months    Recent Outpatient Visits          3 weeks ago Essential (primary) hypertension   [facility] Jerrol Banana., MD   2 months ago Essential (primary) hypertension   [facility] Jerrol Banana., MD   3 months ago Essential (primary) hypertension   [facility] Jerrol Banana., MD   6 months ago Allergic rhinitis, unspecified seasonality, unspecified trigger   [place], [place]   6 months ago MCI (mild cognitive impairment)   [facility] Jerrol Banana., MD      Future Appointments            In 3 months Jerrol Banana., MD [facility], [place]

## 2019-12-19 ENCOUNTER — Encounter: Payer: Self-pay | Admitting: Physician Assistant

## 2019-12-19 ENCOUNTER — Other Ambulatory Visit: Payer: Self-pay

## 2019-12-19 ENCOUNTER — Ambulatory Visit (INDEPENDENT_AMBULATORY_CARE_PROVIDER_SITE_OTHER): Payer: 59 | Admitting: Physician Assistant

## 2019-12-19 DIAGNOSIS — F319 Bipolar disorder, unspecified: Secondary | ICD-10-CM

## 2019-12-19 DIAGNOSIS — Z79899 Other long term (current) drug therapy: Secondary | ICD-10-CM

## 2019-12-19 DIAGNOSIS — F411 Generalized anxiety disorder: Secondary | ICD-10-CM

## 2019-12-19 NOTE — Progress Notes (Signed)
[facility] Med Check  Patient ID: Peter [name],  MRN: [id number]  PCP: Jerrol Banana., MD  Date of Evaluation: 12/19/2019 Time spent:20 minutes  Chief Complaint:  Chief Complaint    Anxiety; Depression; Follow-up      HISTORY/CURRENT STATUS: HPI for routine med check.    States he is doing well. Meds are working well. Able to enjoy things.  Happy that he is back at work now. Energy and motivation are good. Sleeps good.  Uses CPAP. No SI/HI.   Anxiety is controlled. Xanax is helping when he needs it.   Denies increased energy with decreased need for sleep, no impulsivity, risky behavior, increased libido, or spending.  No grandiosity.  No hallucinations.  Denies dizziness, syncope, seizures, numbness, tingling, tremor, tics, unsteady gait, slurred speech, confusion. Denies muscle or joint pain, stiffness, or dystonia.  Individual Medical History/ Review of Systems: Changes? :No    Past medications for mental health diagnoses include: Zoloft, Cymbalta did not help, Pristiq did not help, Seroquel, Xanax, Depakote, Lamictal, Equetro, Klonopin  Allergies: Oxycodone and Isosorbide  Current Medications:  Current Outpatient Medications:  .  ALPRAZolam (XANAX) 0.5 MG tablet, TAKE 1 TABLET(0.5 MG) BY MOUTH TWICE DAILY AS NEEDED FOR ANXIETY, Disp: 60 tablet, Rfl: 5 .  amLODipine (NORVASC) 10 MG tablet, TAKE 1 TABLET(10 MG) BY MOUTH DAILY, Disp: 90 tablet, Rfl: 1 .  aspirin EC 81 MG tablet, Take 81 mg by mouth daily., Disp: , Rfl:  .  atorvastatin (LIPITOR) 40 MG tablet, TAKE 1 TABLET DAILY, Disp: 90 tablet, Rfl: 3 .  azelastine (ASTELIN) 0.1 % nasal spray, SMARTSIG:1-2 Spray(s) Both Nares Twice Daily, Disp: , Rfl:  .  b complex vitamins capsule, Take 1 capsule by mouth daily., Disp: 30 capsule, Rfl: 11 .  carbamazepine (TEGRETOL) 200 MG tablet, TAKE 3 TABLETS BY MOUTH EVERY MORNING AND 2 TABLETS BY MOUTH EVERY NIGHT AT BEDTIME, Disp: 450 tablet, Rfl: 0 .  eplerenone  (INSPRA) 50 MG tablet, Take 50 mg by mouth daily., Disp: , Rfl:  .  fluticasone (FLONASE) 50 MCG/ACT nasal spray, SMARTSIG:1-2 Spray(s) Both Nares Daily, Disp: , Rfl:  .  hydrALAZINE (APRESOLINE) 100 MG tablet, TAKE 1 TABLET THREE TIMES A DAY, Disp: 270 tablet, Rfl: 0 .  ibuprofen (ADVIL) 800 MG tablet, , Disp: , Rfl:  .  lamoTRIgine (LAMICTAL) 200 MG tablet, Take 1.5 tablets (300 mg total) by mouth at bedtime., Disp: 135 tablet, Rfl: 1 .  latanoprost (XALATAN) 0.005 % ophthalmic solution, Apply to eye., Disp: , Rfl:  .  losartan (COZAAR) 100 MG tablet, TAKE 1 TABLET DAILY, Disp: 90 tablet, Rfl: 0 .  metoprolol tartrate (LOPRESSOR) 50 MG tablet, TAKE 1 TABLET TWICE A DAY, Disp: 180 tablet, Rfl: 0 .  montelukast (SINGULAIR) 10 MG tablet, Take 10 mg by mouth at bedtime., Disp: , Rfl:  .  NARCAN 4 MG/0.1ML LIQD nasal spray kit, USE 1 SPRAY IEN UTD PRN, Disp: , Rfl:  .  NON FORMULARY, CPAP, Disp: , Rfl:  .  Omega-3 Fatty Acids (FISH OIL) 1000 MG CAPS, Take by mouth., Disp: , Rfl:  .  omeprazole (PRILOSEC) 40 MG capsule, Take 1 capsule (40 mg total) by mouth daily before breakfast., Disp: 30 capsule, Rfl: 3 .  sertraline (ZOLOFT) 100 MG tablet, Take 1 tablet (100 mg total) by mouth daily. (Patient taking differently: Take 100 mg by mouth 2 (two) times daily. ), Disp: 90 tablet, Rfl: 3 .  vitamin B-12 (CYANOCOBALAMIN) 1000 MCG tablet, , Disp: ,  Rfl:  .  UNABLE TO FIND, 900 mg by Per post-pyloric tube route. Med Name: [name] (Patient not taking: Reported on 12/19/2019), Disp: , Rfl:  Medication Side Effects: none  Family Medical/ Social History: Changes? 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  General Appearance: Casual, Neat, Well Groomed and Obese  Eye Contact:  Good  Speech:  Clear and Coherent and Normal Rate  Volume:  Normal  Mood:   Euthymic  Affect:  Appropriate  Thought Process:  Goal Directed and Descriptions of Associations: Intact  Orientation:  Full (Time, Place, and Person)  Thought Content: Logical   Suicidal Thoughts:  No  Homicidal Thoughts:  No  Memory:  WNL  Judgement:  Good  Insight:  Good  Psychomotor Activity:  Normal  Concentration:  Concentration: Good  Recall:  Good  Fund of Knowledge: Good  Language: Good  Assets:  Desire for Improvement  ADL's:  Intact  Cognition: WNL  Prognosis:  Good    DIAGNOSES:    ICD-10-CM   1. Bipolar I disorder ([place])  F31.9   2. Encounter for long-term (current) use of medications  Z79.899 Carbamazepine level, total  3. Generalized anxiety disorder  F41.1     Receiving Psychotherapy: No    RECOMMENDATIONS:  PDMP was reviewed. I provided 20 minutes of face-to-face time during this encounter. Continue Xanax 0.5 mg, 1 p.o. twice daily as needed. Continue carbamazepine 200 mg, 3 p.o. every morning, 2 p.o. nightly. Continue Lamictal 200 mg, 1.5 pills q. at bedtime. Continue Zoloft 100 mg, 1 p.o. twice daily. Continue multivitamin, B complex, omega-3 He will have a carbamazepine level drawn when he has labs done the next time.  He may be losing his insurance in the fall so I would like for him to at least have his carbamazepine level drawn before the end. Return in 6 months.  Donnal Moat, PA-C

## 2020-01-15 ENCOUNTER — Other Ambulatory Visit: Payer: Self-pay | Admitting: Family Medicine

## 2020-01-15 NOTE — Telephone Encounter (Signed)
Requested Prescriptions  Pending Prescriptions Disp Refills  . sertraline (ZOLOFT) 100 MG tablet [Pharmacy Med Name: SERTRALINE HCL TABS 100MG ] 90 tablet 1    Sig: TAKE 1 TABLET DAILY     Psychiatry:  Antidepressants - SSRI Passed - 01/15/2020 12:58 AM      Passed - Completed PHQ-2 or PHQ-9 in the last 360 days.      Passed - Valid encounter within last 6 months    Recent Outpatient Visits          2 months ago Essential (primary) hypertension   [facility] Jerrol Banana., MD   4 months ago Essential (primary) hypertension   [facility] Jerrol Banana., MD   5 months ago Essential (primary) hypertension   [facility] Jerrol Banana., MD   7 months ago Allergic rhinitis, unspecified seasonality, unspecified trigger   [place], [place]   8 months ago MCI (mild cognitive impairment)   [facility] Jerrol Banana., MD      Future Appointments            In 2 months Jerrol Banana., MD [facility] [place]

## 2020-01-19 ENCOUNTER — Other Ambulatory Visit: Payer: Self-pay | Admitting: Physician Assistant

## 2020-03-10 ENCOUNTER — Other Ambulatory Visit: Payer: Self-pay | Admitting: Family Medicine

## 2020-03-10 DIAGNOSIS — I10 Essential (primary) hypertension: Secondary | ICD-10-CM

## 2020-03-17 ENCOUNTER — Encounter: Payer: Self-pay | Admitting: Family Medicine

## 2020-03-17 ENCOUNTER — Ambulatory Visit (INDEPENDENT_AMBULATORY_CARE_PROVIDER_SITE_OTHER): Payer: Managed Care, Other (non HMO) | Admitting: Family Medicine

## 2020-03-17 ENCOUNTER — Other Ambulatory Visit: Payer: Self-pay

## 2020-03-17 VITALS — BP 162/84 | HR 61 | Temp 98.5°F | Resp 18 | Ht 67.0 in | Wt 303.0 lb

## 2020-03-17 DIAGNOSIS — R350 Frequency of micturition: Secondary | ICD-10-CM

## 2020-03-17 DIAGNOSIS — I10 Essential (primary) hypertension: Secondary | ICD-10-CM

## 2020-03-17 DIAGNOSIS — Z125 Encounter for screening for malignant neoplasm of prostate: Secondary | ICD-10-CM | POA: Diagnosis not present

## 2020-03-17 DIAGNOSIS — E7800 Pure hypercholesterolemia, unspecified: Secondary | ICD-10-CM

## 2020-03-17 DIAGNOSIS — Z6841 Body Mass Index (BMI) 40.0 and over, adult: Secondary | ICD-10-CM

## 2020-03-17 DIAGNOSIS — M7989 Other specified soft tissue disorders: Secondary | ICD-10-CM

## 2020-03-17 DIAGNOSIS — F319 Bipolar disorder, unspecified: Secondary | ICD-10-CM

## 2020-03-17 DIAGNOSIS — Z23 Encounter for immunization: Secondary | ICD-10-CM | POA: Diagnosis not present

## 2020-03-17 DIAGNOSIS — Z0000 Encounter for general adult medical examination without abnormal findings: Secondary | ICD-10-CM | POA: Diagnosis not present

## 2020-03-17 LAB — POCT URINALYSIS DIPSTICK
Appearance: ABNORMAL
Bilirubin, UA: NEGATIVE
Blood, UA: NEGATIVE
Glucose, UA: NEGATIVE
Ketones, UA: NEGATIVE
Nitrite, UA: NEGATIVE
Odor: NORMAL
Protein, UA: POSITIVE — AB
Spec Grav, UA: 1.02 (ref 1.010–1.025)
Urobilinogen, UA: 0.2 E.U./dL
pH, UA: 6 (ref 5.0–8.0)

## 2020-03-17 MED ORDER — TAMSULOSIN HCL 0.4 MG PO CAPS: 0.4000 mg | ORAL_CAPSULE | Freq: Every day | ORAL | 5 refills | Status: DC

## 2020-03-17 NOTE — Progress Notes (Signed)
I,[name],acting as a scribe for Peter Durie, MD.,have documented all relevant documentation on the behalf of Peter Durie, MD,as directed by  Peter Durie, MD while in the presence of Peter Durie, MD.   Complete physical exam   Patient: Peter [name]   DOB: [birth year]   59 y.o. Male  MRN: [id number] Visit Date: 03/17/2020  Today's healthcare provider: [name], MD   Chief Complaint  Patient presents with  . Annual Exam   Subjective    Peter [name] is a 59 y.o. male who presents today for a complete physical exam.  He reports consuming a general diet. The patient does not participate in regular exercise at present. He generally feels well. He reports sleeping well. He does not have additional problems to discuss today.  HPI    Past Medical History:  Diagnosis Date  . Bipolar disorder ([place])   . Blood in stool   . Difficult intubation   . Dyspnea   . GERD (gastroesophageal reflux disease)   . Glaucoma    since 2004  . Hypertension    since 2000  . Obesity, unspecified   . Sleep apnea   . Ulcer 2011   gastric  . Unspecified hemorrhoids without mention of complication    Past Surgical History:  Procedure Laterality Date  . APPLICATION OF WOUND VAC N/A 11/16/2016   Procedure: APPLICATION OF WOUND VAC-ABDOMINAL;  Surgeon: Florene Glen, MD;  Location: [facility];  Service: General;  Laterality: N/A;  . APPLICATION OF WOUND VAC N/A 11/18/2016   Procedure: APPLICATION OF WOUND VAC;  Surgeon: Florene Glen, MD;  Location: [facility];  Service: General;  Laterality: N/A;  change  . APPLICATION OF WOUND VAC N/A 11/23/2016   Procedure: WOUND VAC CHANGE;  Surgeon: Olean Ree, MD;  Location: [facility];  Service: General;  Laterality: N/A;  wound vac application  . CARDIAC CATHETERIZATION Left 06/23/2016   Procedure: Left Heart Cath and Coronary Angiography;  Surgeon: Nelva Bush, MD;  Location: [place] [facility];   Service: Cardiovascular;  Laterality: Left;  . CHOLECYSTECTOMY  1992  . COLONOSCOPY W/ POLYPECTOMY  05/22/2010   36mmtransverse colon polyp, traditional serrated adenoma,negative for high grade dysplasia & malignancy. rectal polyp-14mm,negative for dysplasia & malignancy.  . COLONOSCOPY WITH PROPOFOL N/A 06/20/2019   Procedure: COLONOSCOPY WITH PROPOFOL;  Surgeon: Lin Landsman, MD;  Location: [facility];  Service: Gastroenterology;  Laterality: N/A;  . COLONOSCOPY WITH PROPOFOL N/A 06/21/2019   Procedure: COLONOSCOPY WITH PROPOFOL;  Surgeon: Lin Landsman, MD;  Location: [facility]COPY;  Service: Gastroenterology;  Laterality: N/A;  . DRESSING CHANGE UNDER ANESTHESIA N/A 11/21/2016   Procedure: DRESSING CHANGE UNDER ANESTHESIA;  Surgeon: Florene Glen, MD;  Location: [facility];  Service: General;  Laterality: N/A;  . ESOPHAGOGASTRODUODENOSCOPY (EGD) WITH PROPOFOL N/A 06/20/2019   Procedure: ESOPHAGOGASTRODUODENOSCOPY (EGD) WITH PROPOFOL;  Surgeon: Lin Landsman, MD;  Location: [facility];  Service: Gastroenterology;  Laterality: N/A;  . KNEE SURGERY Left 2010  . LAPAROSCOPIC APPENDECTOMY N/A 10/30/2016   Procedure: APPENDECTOMY LAPAROSCOPIC changed  to open application of wound vac;  Surgeon: [name], MD;  Location: [facility];  Service: General;  Laterality: N/A;  . LAPAROTOMY N/A 11/11/2016   Procedure: EXPLORATORY LAPAROTOMY, DEBRIDEMENT OF ABDOMINAL WOUND, ABDOMINAL Reserve;  Surgeon: [name], MD;  Location: [facility];  Service: General;  Laterality: N/A;  . LAPAROTOMY N/A 11/12/2016   Procedure: EXPLORATORY LAPAROTOMY;  Surgeon:  Clayburn Pert, MD;  Location: [facility];  Service: General;  Laterality: N/A;  . LAPAROTOMY N/A 11/14/2016   Procedure: EXPLORATORY LAPAROTOMY; Irrigation, partial closure;  Surgeon: Clayburn Pert, MD;  Location: [facility];  Service: General;  Laterality: N/A;  . TOTAL KNEE ARTHROPLASTY Left 03/21/2019   Procedure: TOTAL KNEE  ARTHROPLASTY;  Surgeon: Lovell Sheehan, MD;  Location: [facility];  Service: Orthopedics;  Laterality: Left;   Social History   Socioeconomic History  . Marital status: Married    Spouse name: Not on file  . Number of children: Not on file  . Years of education: Not on file  . Highest education level: Not on file  Occupational History  . Not on file  Tobacco Use  . Smoking status: Never Smoker  . Smokeless tobacco: Never Used  Vaping Use  . Vaping Use: Never used  Substance and Sexual Activity  . Alcohol use: Not Currently    Comment: occasionally  . Drug use: No  . Sexual activity: Not on file  Other Topics Concern  . Not on file  Social History Narrative  . Not on file   Social Determinants of Health   Financial Resource Strain:   . Difficulty of Paying Living Expenses: Not on file  Food Insecurity:   . Worried About Charity fundraiser in the Last Year: Not on file  . Ran Out of Food in the Last Year: Not on file  Transportation Needs:   . Lack of Transportation (Medical): Not on file  . Lack of Transportation (Non-Medical): Not on file  Physical Activity:   . Days of Exercise per Week: Not on file  . Minutes of Exercise per Session: Not on file  Stress:   . Feeling of Stress : Not on file  Social Connections:   . Frequency of Communication with Friends and Family: Not on file  . Frequency of Social Gatherings with Friends and Family: Not on file  . Attends Religious Services: Not on file  . Active Member of Clubs or Organizations: Not on file  . Attends Archivist Meetings: Not on file  . Marital Status: Not on file  Intimate Partner Violence:   . Fear of Current or Ex-Partner: Not on file  . Emotionally Abused: Not on file  . Physically Abused: Not on file  . Sexually Abused: Not on file   Family Status  Relation Name Status  . Other  Deceased  . Mother  Deceased  . Father  Deceased  . Sister  Alive  . Sister  Alive   Family History   Problem Relation Age of Onset  . Cancer Other        colon  . Hyperlipidemia Mother   . Hypertension Mother    Allergies  Allergen Reactions  . Oxycodone Other (See Comments)    Causes Hallucinations, auditory and tactile  . Isosorbide Nausea And Vomiting    Headache, nausea, vomiting    Patient Care Team: Jerrol Banana., MD as PCP - General (Family Medicine)   Medications: Outpatient Medications Prior to Visit  Medication Sig  . ALPRAZolam (XANAX) 0.5 MG tablet TAKE 1 TABLET(0.5 MG) BY MOUTH TWICE DAILY AS NEEDED FOR ANXIETY  . amLODipine (NORVASC) 10 MG tablet TAKE 1 TABLET(10 MG) BY MOUTH DAILY  . aspirin EC 81 MG tablet Take 81 mg by mouth daily.  Marland Kitchen atorvastatin (LIPITOR) 40 MG tablet TAKE 1 TABLET DAILY  . azelastine (ASTELIN) 0.1 % nasal spray SMARTSIG:1-2 Spray(s)  Both Nares Twice Daily  . b complex vitamins capsule Take 1 capsule by mouth daily.  . carbamazepine (TEGRETOL) 200 MG tablet TAKE 3 TABLETS BY MOUTH EVERY MORNING AND 2 TABLETS BY MOUTH EVERY NIGHT AT BEDTIME  . Cholecalciferol (NAT-RUL VITAMIN D) 25 MCG (1000 UT) tablet Take 1,000 Units by mouth daily.  Marland Kitchen eplerenone (INSPRA) 50 MG tablet Take 50 mg by mouth daily.  . fluticasone (FLONASE) 50 MCG/ACT nasal spray SMARTSIG:1-2 Spray(s) Both Nares Daily  . hydrALAZINE (APRESOLINE) 100 MG tablet TAKE 1 TABLET THREE TIMES A DAY  . ibuprofen (ADVIL) 800 MG tablet   . lamoTRIgine (LAMICTAL) 200 MG tablet Take 1.5 tablets (300 mg total) by mouth at bedtime.  Marland Kitchen latanoprost (XALATAN) 0.005 % ophthalmic solution Apply to eye.  . losartan (COZAAR) 100 MG tablet TAKE 1 TABLET DAILY  . metoprolol tartrate (LOPRESSOR) 50 MG tablet TAKE 1 TABLET TWICE A DAY  . montelukast (SINGULAIR) 10 MG tablet Take 10 mg by mouth at bedtime.  Marland Kitchen NARCAN 4 MG/0.1ML LIQD nasal spray kit USE 1 SPRAY IEN UTD PRN  . NON FORMULARY CPAP  . Omega-3 Fatty Acids (FISH OIL) 1000 MG CAPS Take by mouth.  Marland Kitchen omeprazole (PRILOSEC) 40 MG capsule  Take 1 capsule (40 mg total) by mouth daily before breakfast.  . sertraline (ZOLOFT) 100 MG tablet TAKE 1 TABLET DAILY  . vitamin B-12 (CYANOCOBALAMIN) 1000 MCG tablet   . UNABLE TO FIND 900 mg by Per post-pyloric tube route. Med Name: Kristen Cardinal (Patient not taking: Reported on 12/19/2019)   No facility-administered medications prior to visit.    Review of Systems  HENT: Positive for postnasal drip, sinus pressure and sneezing.   Endocrine: Positive for polyuria.  Musculoskeletal: Positive for arthralgias.  Neurological: Positive for headaches.  All other systems reviewed and are negative.   Last hemoglobin A1c Lab Results  Component Value Date   HGBA1C 5.0 01/23/2019      Objective    BP (!) 162/84 (BP Location: Left Arm, Cuff Size: Large)   Pulse 61   Temp 98.5 F (36.9 C) (Oral)   Resp 18   Ht 5\' 7"  (1.702 m)   Wt (!) 303 lb (137.4 kg)   SpO2 97%   BMI 47.46 kg/m  BP Readings from Last 3 Encounters:  03/17/20 (!) 162/84  11/14/19 136/73  09/13/19 (!) 171/105   Wt Readings from Last 3 Encounters:  03/17/20 (!) 303 lb (137.4 kg)  11/14/19 298 lb 6.4 oz (135.4 kg)  09/13/19 290 lb (131.5 kg)      Physical Exam Vitals reviewed.  Constitutional:      Appearance: He is well-developed.     Comments: Obese white male in no acute distress  HENT:     Head: Normocephalic and atraumatic.     Right Ear: External ear normal.     Left Ear: External ear normal.     Nose: Nose normal.  Eyes:     General: No scleral icterus.    Conjunctiva/sclera: Conjunctivae normal.  Neck:     Thyroid: No thyromegaly.     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     Heart sounds: Normal heart sounds.  Pulmonary:     Effort: Pulmonary effort is normal.     Breath sounds: Normal breath sounds.  Abdominal:     General: Bowel sounds are normal. There is no distension.     Tenderness:  There is no  abdominal tenderness.     Comments: Midline scar is surrounded by can very firm scar tissue probably 3 inches on either side of the scar.  Genitourinary:    Penis: Normal.      Testes: Normal.  Skin:    General: Skin is warm and dry.  Neurological:     General: No focal deficit present.     Mental Status: He is alert and oriented to person, place, and time.     Cranial Nerves: No cranial nerve deficit.     Sensory: No sensory deficit.     Motor: No abnormal muscle tone.  Psychiatric:        Mood and Affect: Mood normal.        Behavior: Behavior normal.        Thought Content: Thought content normal.        Judgment: Judgment normal.      General Appearance:    Head:    Eyes:    Ears:    Nose:   Throat:   Neck:   Back:     Lungs:     Chest wall:    Heart:    Abdomen:     Genitalia:    Rectal:    Extremities:   Pulses:   Skin:   Lymph nodes:   Neurologic:      Last depression screening scores PHQ 2/9 Scores 03/17/2020 11/14/2019 06/06/2018  PHQ - 2 Score 1 0 0  PHQ- 9 Score 1 0 1   Last fall risk screening Fall Risk  03/25/2015  Falls in the past year? No   Last Audit-C alcohol use screening Alcohol Use Disorder Test (AUDIT) 03/17/2020  1. How often do you have a drink containing alcohol? 1  2. How many drinks containing alcohol do you have on a typical day when you are drinking? 0  3. How often do you have six or more drinks on one occasion? 0  AUDIT-C Score 1  Alcohol Brief Interventions/Follow-up AUDIT Score <7 follow-up not indicated   A score of 3 or more in women, and 4 or more in men indicates increased risk for alcohol abuse, EXCEPT if all of the points are from question 1   No results found for any visits on 03/17/20.  Assessment & Plan    Routine Health Maintenance and Physical Exam  Exercise Activities and Dietary recommendations Goals   None     Immunization History  Administered Date(s) Administered  . Influenza Split 04/16/2008,  04/08/2009, 03/04/2010, 04/06/2011  . Influenza,inj,Quad PF,6+ Mos 04/21/2013, 03/19/2014, 03/25/2015, 05/04/2016, 05/31/2017, 06/06/2018, 03/15/2019  . PFIZER SARS-COV-2 Vaccination 10/03/2019, 10/24/2019  . Tdap 04/06/2011, 04/18/2016  . Zoster Recombinat (Shingrix) 12/06/2017, 02/09/2018    Health Maintenance  Topic Date Due  . INFLUENZA VACCINE  02/03/2020  . COLONOSCOPY  06/20/2022  . TETANUS/TDAP  04/18/2026  . COVID-19 Vaccine  Completed  . Hepatitis C Screening  Completed  . HIV Screening  Completed    Discussed health benefits of physical activity, and encouraged him to engage in regular exercise appropriate for his age and condition.  1. Annual physical exam  - Lipid panel - CBC w/Diff/Platelet - Comprehensive Metabolic Panel (CMET) - TSH - PSA - POCT urinalysis dipstick  2. Essential (primary) hypertension  - Lipid panel - CBC w/Diff/Platelet - Comprehensive Metabolic Panel (CMET) - TSH  3. Hypercholesterolemia without hypertriglyceridemia  - Lipid panel - CBC w/Diff/Platelet - Comprehensive Metabolic Panel (CMET) - TSH  4. Screening  for prostate cancer  - Lipid panel - CBC w/Diff/Platelet - Comprehensive Metabolic Panel (CMET) - TSH - PSA  5. Urinary frequency Flomax  - tamsulosin (FLOMAX) 0.4 MG CAPS capsule; Take 1 capsule (0.4 mg total) by mouth daily.  Dispense: 30 capsule; Refill: 5 - POCT urinalysis dipstick - CULTURE, URINE COMPREHENSIVE  6. Need for influenza vaccination  - Flu Vaccine QUAD 36+ mos  -   7. Class 3 severe obesity due to excess calories with serious comorbidity and body mass index (BMI) of 45.0 to 49.9 in adult ([place])   8. Necrotizing soft tissue infection Recovered  9. Bipolar depression [facility]) Per psychiatry.   No follow-ups on file.     I, Peter Durie, MD, have reviewed all documentation for this visit. The documentation on 03/22/20 for the exam, diagnosis, procedures, and orders are all accurate and  complete.    Madeleine Fenn Cranford Mon, MD  [facility] [phone] (phone) [phone] (fax)  [place]

## 2020-03-20 ENCOUNTER — Telehealth: Payer: Self-pay

## 2020-03-20 NOTE — Telephone Encounter (Signed)
-----   Message from Jerrol Banana., MD sent at 03/20/2020  8:15 AM EDT ----- Urine ok

## 2020-03-20 NOTE — Telephone Encounter (Signed)
Patient advised of urine culture results via mychart and has read the providers comment.

## 2020-03-23 ENCOUNTER — Other Ambulatory Visit: Payer: Self-pay | Admitting: Physician Assistant

## 2020-03-24 NOTE — Telephone Encounter (Signed)
review 

## 2020-03-26 LAB — COMPREHENSIVE METABOLIC PANEL
ALT: 17 IU/L (ref 0–44)
AST: 24 IU/L (ref 0–40)
Albumin/Globulin Ratio: 1.3 (ref 1.2–2.2)
Albumin: 3.8 g/dL (ref 3.8–4.8)
Alkaline Phosphatase: 141 IU/L — ABNORMAL HIGH (ref 44–121)
BUN/Creatinine Ratio: 16 (ref 10–24)
BUN: 15 mg/dL (ref 8–27)
Bilirubin Total: 0.4 mg/dL (ref 0.0–1.2)
CO2: 25 mmol/L (ref 20–29)
Calcium: 8.8 mg/dL (ref 8.6–10.2)
Chloride: 104 mmol/L (ref 96–106)
Creatinine, Ser: 0.92 mg/dL (ref 0.76–1.27)
GFR calc Af Amer: 103 mL/min/{1.73_m2} (ref >59)
GFR calc non Af Amer: 89 mL/min/{1.73_m2} (ref >59)
Globulin, Total: 2.9 g/dL (ref 1.5–4.5)
Glucose: 84 mg/dL (ref 65–99)
Potassium: 3.4 mmol/L — ABNORMAL LOW (ref 3.5–5.2)
Sodium: 143 mmol/L (ref 134–144)
Total Protein: 6.7 g/dL (ref 6.0–8.5)

## 2020-03-26 LAB — CBC WITH DIFFERENTIAL/PLATELET
Basophils Absolute: 0 10*3/uL (ref 0.0–0.2)
Basos: 1 %
EOS (ABSOLUTE): 0.1 10*3/uL (ref 0.0–0.4)
Eos: 2 %
Hematocrit: 42.5 % (ref 37.5–51.0)
Hemoglobin: 14 g/dL (ref 13.0–17.7)
Immature Grans (Abs): 0 10*3/uL (ref 0.0–0.1)
Immature Granulocytes: 0 %
Lymphocytes Absolute: 1.9 10*3/uL (ref 0.7–3.1)
Lymphs: 37 %
MCH: 29.4 pg (ref 26.6–33.0)
MCHC: 32.9 g/dL (ref 31.5–35.7)
MCV: 89 fL (ref 79–97)
Monocytes Absolute: 0.4 10*3/uL (ref 0.1–0.9)
Monocytes: 8 %
Neutrophils Absolute: 2.7 10*3/uL (ref 1.4–7.0)
Neutrophils: 52 %
Platelets: 183 10*3/uL (ref 150–450)
RBC: 4.77 x10E6/uL (ref 4.14–5.80)
RDW: 12.8 % (ref 11.6–15.4)
WBC: 5.1 10*3/uL (ref 3.4–10.8)

## 2020-03-26 LAB — LIPID PANEL
Chol/HDL Ratio: 2.1 ratio (ref 0.0–5.0)
Cholesterol, Total: 157 mg/dL (ref 100–199)
HDL: 76 mg/dL (ref >39)
LDL Chol Calc (NIH): 63 mg/dL (ref 0–99)
Triglycerides: 98 mg/dL (ref 0–149)
VLDL Cholesterol Cal: 18 mg/dL (ref 5–40)

## 2020-03-26 LAB — PSA: Prostate Specific Ag, Serum: 2.5 ng/mL (ref 0.0–4.0)

## 2020-03-26 LAB — CULTURE, URINE COMPREHENSIVE

## 2020-03-26 LAB — TSH: TSH: 2.73 u[IU]/mL (ref 0.450–4.500)

## 2020-05-09 ENCOUNTER — Other Ambulatory Visit: Payer: Self-pay | Admitting: Family Medicine

## 2020-05-09 ENCOUNTER — Other Ambulatory Visit: Payer: Self-pay | Admitting: Physician Assistant

## 2020-05-09 DIAGNOSIS — I10 Essential (primary) hypertension: Secondary | ICD-10-CM

## 2020-05-12 NOTE — Telephone Encounter (Signed)
Apt 07/08/20

## 2020-05-15 ENCOUNTER — Other Ambulatory Visit: Payer: Self-pay | Admitting: Physician Assistant

## 2020-05-27 ENCOUNTER — Other Ambulatory Visit: Payer: Self-pay

## 2020-05-27 DIAGNOSIS — K5652 Intestinal adhesions [bands] with complete obstruction: Secondary | ICD-10-CM | POA: Diagnosis not present

## 2020-05-27 DIAGNOSIS — Z6841 Body Mass Index (BMI) 40.0 and over, adult: Secondary | ICD-10-CM

## 2020-05-27 DIAGNOSIS — J9601 Acute respiratory failure with hypoxia: Secondary | ICD-10-CM | POA: Diagnosis present

## 2020-05-27 DIAGNOSIS — Z20822 Contact with and (suspected) exposure to covid-19: Secondary | ICD-10-CM | POA: Diagnosis present

## 2020-05-27 DIAGNOSIS — Z79899 Other long term (current) drug therapy: Secondary | ICD-10-CM

## 2020-05-27 DIAGNOSIS — E872 Acidosis: Secondary | ICD-10-CM | POA: Diagnosis present

## 2020-05-27 DIAGNOSIS — Z96652 Presence of left artificial knee joint: Secondary | ICD-10-CM | POA: Diagnosis present

## 2020-05-27 DIAGNOSIS — Z9049 Acquired absence of other specified parts of digestive tract: Secondary | ICD-10-CM

## 2020-05-27 DIAGNOSIS — E876 Hypokalemia: Secondary | ICD-10-CM | POA: Diagnosis present

## 2020-05-27 DIAGNOSIS — K219 Gastro-esophageal reflux disease without esophagitis: Secondary | ICD-10-CM | POA: Diagnosis present

## 2020-05-27 DIAGNOSIS — I160 Hypertensive urgency: Secondary | ICD-10-CM | POA: Diagnosis present

## 2020-05-27 DIAGNOSIS — I10 Essential (primary) hypertension: Secondary | ICD-10-CM | POA: Diagnosis present

## 2020-05-27 DIAGNOSIS — N400 Enlarged prostate without lower urinary tract symptoms: Secondary | ICD-10-CM | POA: Diagnosis present

## 2020-05-27 DIAGNOSIS — G4733 Obstructive sleep apnea (adult) (pediatric): Secondary | ICD-10-CM | POA: Diagnosis present

## 2020-05-27 DIAGNOSIS — J690 Pneumonitis due to inhalation of food and vomit: Secondary | ICD-10-CM | POA: Diagnosis present

## 2020-05-27 DIAGNOSIS — F419 Anxiety disorder, unspecified: Secondary | ICD-10-CM | POA: Diagnosis present

## 2020-05-27 DIAGNOSIS — F319 Bipolar disorder, unspecified: Secondary | ICD-10-CM | POA: Diagnosis present

## 2020-05-27 DIAGNOSIS — Z7982 Long term (current) use of aspirin: Secondary | ICD-10-CM

## 2020-05-27 DIAGNOSIS — E669 Obesity, unspecified: Secondary | ICD-10-CM | POA: Diagnosis present

## 2020-05-27 DIAGNOSIS — E785 Hyperlipidemia, unspecified: Secondary | ICD-10-CM | POA: Diagnosis present

## 2020-05-27 LAB — COMPREHENSIVE METABOLIC PANEL
ALT: 20 U/L (ref 0–44)
AST: 28 U/L (ref 15–41)
Albumin: 4.2 g/dL (ref 3.5–5.0)
Alkaline Phosphatase: 126 U/L (ref 38–126)
Anion gap: 16 — ABNORMAL HIGH (ref 5–15)
BUN: 18 mg/dL (ref 8–23)
CO2: 27 mmol/L (ref 22–32)
Calcium: 9.3 mg/dL (ref 8.9–10.3)
Chloride: 98 mmol/L (ref 98–111)
Creatinine, Ser: 0.86 mg/dL (ref 0.61–1.24)
GFR, Estimated: >60 mL/min (ref >60)
Glucose, Bld: 138 mg/dL — ABNORMAL HIGH (ref 70–99)
Potassium: 3.1 mmol/L — ABNORMAL LOW (ref 3.5–5.1)
Sodium: 141 mmol/L (ref 135–145)
Total Bilirubin: 1 mg/dL (ref 0.3–1.2)
Total Protein: 8 g/dL (ref 6.5–8.1)

## 2020-05-27 LAB — CBC
HCT: 46.4 % (ref 39.0–52.0)
Hemoglobin: 15.6 g/dL (ref 13.0–17.0)
MCH: 28.9 pg (ref 26.0–34.0)
MCHC: 33.6 g/dL (ref 30.0–36.0)
MCV: 85.9 fL (ref 80.0–100.0)
Platelets: 227 10*3/uL (ref 150–400)
RBC: 5.4 MIL/uL (ref 4.22–5.81)
RDW: 12.9 % (ref 11.5–15.5)
WBC: 12 10*3/uL — ABNORMAL HIGH (ref 4.0–10.5)
nRBC: 0 % (ref 0.0–0.2)

## 2020-05-27 LAB — LIPASE, BLOOD: Lipase: 44 U/L (ref 11–51)

## 2020-05-27 MED ORDER — ONDANSETRON 4 MG PO TBDP
4.0000 mg | ORAL_TABLET | Freq: Once | ORAL | Status: AC | PRN
  Administered 2020-05-27: 4 mg via ORAL
  Filled 2020-05-27: qty 1

## 2020-05-27 NOTE — ED Triage Notes (Signed)
PT to ED POV for generalized abd pain that pt describes as burning. States he has felt off all day but pain got bad around 2 hrs ago. PT vomited x2, no diarrhea or fever. PT appears uncomfortable. PT has had gallbladder removed.

## 2020-05-28 ENCOUNTER — Inpatient Hospital Stay: Payer: Managed Care, Other (non HMO)

## 2020-05-28 ENCOUNTER — Inpatient Hospital Stay
Admission: EM | Admit: 2020-05-28 | Discharge: 2020-06-03 | DRG: 388 | Disposition: A | Discharge status: Home / Self Care | Payer: Managed Care, Other (non HMO) | Attending: Internal Medicine | Admitting: Internal Medicine

## 2020-05-28 ENCOUNTER — Emergency Department: Payer: Managed Care, Other (non HMO)

## 2020-05-28 DIAGNOSIS — I160 Hypertensive urgency: Secondary | ICD-10-CM

## 2020-05-28 DIAGNOSIS — Z4659 Encounter for fitting and adjustment of other gastrointestinal appliance and device: Secondary | ICD-10-CM | POA: Diagnosis not present

## 2020-05-28 DIAGNOSIS — J9601 Acute respiratory failure with hypoxia: Secondary | ICD-10-CM | POA: Diagnosis present

## 2020-05-28 DIAGNOSIS — R7989 Other specified abnormal findings of blood chemistry: Secondary | ICD-10-CM | POA: Diagnosis not present

## 2020-05-28 DIAGNOSIS — A419 Sepsis, unspecified organism: Secondary | ICD-10-CM | POA: Diagnosis not present

## 2020-05-28 DIAGNOSIS — R6520 Severe sepsis without septic shock: Secondary | ICD-10-CM | POA: Diagnosis not present

## 2020-05-28 DIAGNOSIS — F419 Anxiety disorder, unspecified: Secondary | ICD-10-CM | POA: Diagnosis present

## 2020-05-28 DIAGNOSIS — K56601 Complete intestinal obstruction, unspecified as to cause: Secondary | ICD-10-CM | POA: Diagnosis not present

## 2020-05-28 DIAGNOSIS — Z79899 Other long term (current) drug therapy: Secondary | ICD-10-CM | POA: Diagnosis not present

## 2020-05-28 DIAGNOSIS — I10 Essential (primary) hypertension: Secondary | ICD-10-CM | POA: Diagnosis present

## 2020-05-28 DIAGNOSIS — K219 Gastro-esophageal reflux disease without esophagitis: Secondary | ICD-10-CM | POA: Diagnosis present

## 2020-05-28 DIAGNOSIS — E876 Hypokalemia: Secondary | ICD-10-CM | POA: Diagnosis present

## 2020-05-28 DIAGNOSIS — R1110 Vomiting, unspecified: Secondary | ICD-10-CM

## 2020-05-28 DIAGNOSIS — R911 Solitary pulmonary nodule: Secondary | ICD-10-CM

## 2020-05-28 DIAGNOSIS — E785 Hyperlipidemia, unspecified: Secondary | ICD-10-CM | POA: Diagnosis present

## 2020-05-28 DIAGNOSIS — G4733 Obstructive sleep apnea (adult) (pediatric): Secondary | ICD-10-CM | POA: Diagnosis present

## 2020-05-28 DIAGNOSIS — K56609 Unspecified intestinal obstruction, unspecified as to partial versus complete obstruction: Secondary | ICD-10-CM

## 2020-05-28 DIAGNOSIS — R778 Other specified abnormalities of plasma proteins: Secondary | ICD-10-CM

## 2020-05-28 DIAGNOSIS — Z20822 Contact with and (suspected) exposure to covid-19: Secondary | ICD-10-CM | POA: Diagnosis present

## 2020-05-28 DIAGNOSIS — Z96652 Presence of left artificial knee joint: Secondary | ICD-10-CM | POA: Diagnosis present

## 2020-05-28 DIAGNOSIS — E872 Acidosis: Secondary | ICD-10-CM | POA: Diagnosis present

## 2020-05-28 DIAGNOSIS — J690 Pneumonitis due to inhalation of food and vomit: Secondary | ICD-10-CM | POA: Diagnosis present

## 2020-05-28 DIAGNOSIS — T17908A Unspecified foreign body in respiratory tract, part unspecified causing other injury, initial encounter: Secondary | ICD-10-CM

## 2020-05-28 DIAGNOSIS — Z7982 Long term (current) use of aspirin: Secondary | ICD-10-CM | POA: Diagnosis not present

## 2020-05-28 DIAGNOSIS — E669 Obesity, unspecified: Secondary | ICD-10-CM | POA: Diagnosis present

## 2020-05-28 DIAGNOSIS — N400 Enlarged prostate without lower urinary tract symptoms: Secondary | ICD-10-CM | POA: Diagnosis present

## 2020-05-28 DIAGNOSIS — Z9049 Acquired absence of other specified parts of digestive tract: Secondary | ICD-10-CM | POA: Diagnosis not present

## 2020-05-28 DIAGNOSIS — Z6841 Body Mass Index (BMI) 40.0 and over, adult: Secondary | ICD-10-CM | POA: Diagnosis not present

## 2020-05-28 DIAGNOSIS — F319 Bipolar disorder, unspecified: Secondary | ICD-10-CM | POA: Diagnosis present

## 2020-05-28 DIAGNOSIS — K5652 Intestinal adhesions [bands] with complete obstruction: Secondary | ICD-10-CM | POA: Diagnosis present

## 2020-05-28 LAB — RESP PANEL BY RT-PCR (FLU A&B, COVID) ARPGX2
Influenza A by PCR: NEGATIVE
Influenza B by PCR: NEGATIVE
SARS Coronavirus 2 by RT PCR: NEGATIVE

## 2020-05-28 LAB — URINALYSIS, COMPLETE (UACMP) WITH MICROSCOPIC
Bacteria, UA: NONE SEEN
Bilirubin Urine: NEGATIVE
Glucose, UA: NEGATIVE mg/dL
Hgb urine dipstick: NEGATIVE
Ketones, ur: NEGATIVE mg/dL
Leukocytes,Ua: NEGATIVE
Nitrite: NEGATIVE
Protein, ur: 30 mg/dL — AB
Specific Gravity, Urine: 1.043 — ABNORMAL HIGH (ref 1.005–1.030)
Squamous Epithelial / HPF: NONE SEEN (ref 0–5)
pH: 8 (ref 5.0–8.0)

## 2020-05-28 LAB — CBC
HCT: 43.8 % (ref 39.0–52.0)
Hemoglobin: 15.1 g/dL (ref 13.0–17.0)
MCH: 30 pg (ref 26.0–34.0)
MCHC: 34.5 g/dL (ref 30.0–36.0)
MCV: 87.1 fL (ref 80.0–100.0)
Platelets: 213 10*3/uL (ref 150–400)
RBC: 5.03 MIL/uL (ref 4.22–5.81)
RDW: 13.1 % (ref 11.5–15.5)
WBC: 9 10*3/uL (ref 4.0–10.5)
nRBC: 0 % (ref 0.0–0.2)

## 2020-05-28 LAB — BASIC METABOLIC PANEL
Anion gap: 11 (ref 5–15)
BUN: 15 mg/dL (ref 8–23)
CO2: 28 mmol/L (ref 22–32)
Calcium: 8.8 mg/dL — ABNORMAL LOW (ref 8.9–10.3)
Chloride: 101 mmol/L (ref 98–111)
Creatinine, Ser: 0.79 mg/dL (ref 0.61–1.24)
GFR, Estimated: >60 mL/min (ref >60)
Glucose, Bld: 118 mg/dL — ABNORMAL HIGH (ref 70–99)
Potassium: 3 mmol/L — ABNORMAL LOW (ref 3.5–5.1)
Sodium: 140 mmol/L (ref 135–145)

## 2020-05-28 LAB — PROTIME-INR
INR: 1 (ref 0.8–1.2)
Prothrombin Time: 13.2 seconds (ref 11.4–15.2)

## 2020-05-28 LAB — TROPONIN I (HIGH SENSITIVITY)
Troponin I (High Sensitivity): 21 ng/L — ABNORMAL HIGH (ref <18)
Troponin I (High Sensitivity): 22 ng/L — ABNORMAL HIGH (ref <18)

## 2020-05-28 LAB — LACTIC ACID, PLASMA
Lactic Acid, Venous: 3.1 mmol/L (ref 0.5–1.9)
Lactic Acid, Venous: 1.7 mmol/L (ref 0.5–1.9)

## 2020-05-28 LAB — MAGNESIUM: Magnesium: 2 mg/dL (ref 1.7–2.4)

## 2020-05-28 LAB — CORTISOL-AM, BLOOD: Cortisol - AM: 29.7 ug/dL — ABNORMAL HIGH (ref 6.7–22.6)

## 2020-05-28 LAB — PROCALCITONIN: Procalcitonin: <0.1 ng/mL

## 2020-05-28 LAB — HIV ANTIBODY (ROUTINE TESTING W REFLEX): HIV Screen 4th Generation wRfx: NONREACTIVE

## 2020-05-28 MED ORDER — SODIUM CHLORIDE 0.9 % IV SOLN: INTRAVENOUS | Status: DC

## 2020-05-28 MED ORDER — AMLODIPINE BESYLATE 10 MG PO TABS: 10.0000 mg | ORAL_TABLET | Freq: Every day | ORAL | Status: DC

## 2020-05-28 MED ORDER — ENOXAPARIN SODIUM 80 MG/0.8ML ~~LOC~~ SOLN
70.0000 mg | SUBCUTANEOUS | Status: DC
  Administered 2020-05-29 – 2020-06-03 (×6): 70 mg via SUBCUTANEOUS
  Filled 2020-05-28 (×7): qty 0.8

## 2020-05-28 MED ORDER — IOHEXOL 350 MG/ML SOLN
125.0000 mL | Freq: Once | INTRAVENOUS | Status: AC | PRN
  Administered 2020-05-28: 125 mL via INTRAVENOUS

## 2020-05-28 MED ORDER — LATANOPROST 0.005 % OP SOLN
1.0000 [drp] | Freq: Every day | OPHTHALMIC | Status: DC
  Administered 2020-05-28 – 2020-06-02 (×6): 1 [drp] via OPHTHALMIC
  Filled 2020-05-28: qty 2.5

## 2020-05-28 MED ORDER — ONDANSETRON HCL 4 MG/2ML IJ SOLN
4.0000 mg | INTRAMUSCULAR | Status: AC
  Administered 2020-05-28: 4 mg via INTRAVENOUS
  Filled 2020-05-28: qty 2

## 2020-05-28 MED ORDER — SODIUM CHLORIDE 0.9 % IV SOLN: 1.0000 g | Freq: Three times a day (TID) | INTRAVENOUS | Status: DC

## 2020-05-28 MED ORDER — PANTOPRAZOLE SODIUM 40 MG IV SOLR
40.0000 mg | Freq: Two times a day (BID) | INTRAVENOUS | Status: DC
  Administered 2020-05-28 – 2020-06-01 (×9): 40 mg via INTRAVENOUS
  Filled 2020-05-28 (×11): qty 40

## 2020-05-28 MED ORDER — POTASSIUM CHLORIDE 20 MEQ PO PACK: 40.0000 meq | PACK | Freq: Two times a day (BID) | ORAL | Status: DC

## 2020-05-28 MED ORDER — VITAMIN D 25 MCG (1000 UNIT) PO TABS: 1000.0000 [IU] | ORAL_TABLET | Freq: Every day | ORAL | Status: DC

## 2020-05-28 MED ORDER — PROMETHAZINE HCL 25 MG/ML IJ SOLN: 25.0000 mg | Freq: Four times a day (QID) | INTRAMUSCULAR | Status: DC | PRN

## 2020-05-28 MED ORDER — HYDRALAZINE HCL 50 MG PO TABS: 100.0000 mg | ORAL_TABLET | Freq: Three times a day (TID) | ORAL | Status: DC

## 2020-05-28 MED ORDER — LAMOTRIGINE 100 MG PO TABS: 300.0000 mg | ORAL_TABLET | Freq: Every day | ORAL | Status: DC

## 2020-05-28 MED ORDER — LIDOCAINE VISCOUS HCL 2 % MT SOLN
15.0000 mL | Freq: Once | OROMUCOSAL | Status: AC
  Administered 2020-05-28: 15 mL via OROMUCOSAL

## 2020-05-28 MED ORDER — VITAMIN B-12 1000 MCG PO TABS: 1000.0000 ug | ORAL_TABLET | Freq: Every day | ORAL | Status: DC

## 2020-05-28 MED ORDER — METOPROLOL TARTRATE 5 MG/5ML IV SOLN
5.0000 mg | Freq: Four times a day (QID) | INTRAVENOUS | Status: DC
  Administered 2020-05-28 – 2020-05-29 (×5): 5 mg via INTRAVENOUS
  Filled 2020-05-28 (×5): qty 5

## 2020-05-28 MED ORDER — LIDOCAINE VISCOUS HCL 2 % MT SOLN
15.0000 mL | Freq: Once | OROMUCOSAL | Status: DC
  Filled 2020-05-28: qty 15

## 2020-05-28 MED ORDER — ASPIRIN EC 81 MG PO TBEC: 81.0000 mg | DELAYED_RELEASE_TABLET | Freq: Every day | ORAL | Status: DC

## 2020-05-28 MED ORDER — NALOXONE HCL 4 MG/0.1ML NA LIQD
1.0000 | Freq: Once | NASAL | Status: DC | PRN
  Filled 2020-05-28: qty 8

## 2020-05-28 MED ORDER — LORAZEPAM 2 MG/ML IJ SOLN
2.0000 mg | Freq: Once | INTRAMUSCULAR | Status: AC
  Administered 2020-05-28: 2 mg via INTRAVENOUS
  Filled 2020-05-28: qty 1

## 2020-05-28 MED ORDER — TAMSULOSIN HCL 0.4 MG PO CAPS: 0.4000 mg | ORAL_CAPSULE | Freq: Every day | ORAL | Status: DC

## 2020-05-28 MED ORDER — ATORVASTATIN CALCIUM 20 MG PO TABS: 40.0000 mg | ORAL_TABLET | Freq: Every day | ORAL | Status: DC

## 2020-05-28 MED ORDER — TRAZODONE HCL 50 MG PO TABS: 25.0000 mg | ORAL_TABLET | Freq: Every evening | ORAL | Status: DC | PRN

## 2020-05-28 MED ORDER — FLUTICASONE PROPIONATE 50 MCG/ACT NA SUSP
2.0000 | Freq: Every day | NASAL | Status: DC
  Administered 2020-05-29 – 2020-06-03 (×2): 2 via NASAL
  Filled 2020-05-28: qty 16

## 2020-05-28 MED ORDER — LORAZEPAM 2 MG/ML IJ SOLN
0.5000 mg | Freq: Four times a day (QID) | INTRAMUSCULAR | Status: DC | PRN
  Administered 2020-05-30 – 2020-06-01 (×7): 0.5 mg via INTRAVENOUS
  Filled 2020-05-28 (×7): qty 1

## 2020-05-28 MED ORDER — ENALAPRILAT 1.25 MG/ML IV SOLN
1.2500 mg | Freq: Four times a day (QID) | INTRAVENOUS | Status: DC
  Administered 2020-05-28 – 2020-06-02 (×19): 1.25 mg via INTRAVENOUS
  Filled 2020-05-28 (×20): qty 1

## 2020-05-28 MED ORDER — LOSARTAN POTASSIUM 50 MG PO TABS: 100.0000 mg | ORAL_TABLET | Freq: Every day | ORAL | Status: DC

## 2020-05-28 MED ORDER — ACETAMINOPHEN 650 MG RE SUPP
650.0000 mg | Freq: Four times a day (QID) | RECTAL | Status: DC | PRN
  Administered 2020-05-31: 650 mg via RECTAL

## 2020-05-28 MED ORDER — B COMPLEX-C PO TABS
1.0000 | ORAL_TABLET | Freq: Every day | ORAL | Status: DC
  Filled 2020-05-28: qty 1

## 2020-05-28 MED ORDER — AZELASTINE HCL 0.1 % NA SOLN
2.0000 | Freq: Two times a day (BID) | NASAL | Status: DC
  Administered 2020-05-28 – 2020-06-03 (×8): 2 via NASAL
  Filled 2020-05-28: qty 30

## 2020-05-28 MED ORDER — MORPHINE SULFATE (PF) 2 MG/ML IV SOLN
2.0000 mg | INTRAVENOUS | Status: DC | PRN
  Administered 2020-05-28 – 2020-06-01 (×9): 2 mg via INTRAVENOUS
  Filled 2020-05-28 (×9): qty 1

## 2020-05-28 MED ORDER — SODIUM CHLORIDE 0.9 % IV SOLN
2.0000 g | Freq: Three times a day (TID) | INTRAVENOUS | Status: DC
  Administered 2020-05-28: 2 g via INTRAVENOUS
  Filled 2020-05-28 (×2): qty 2

## 2020-05-28 MED ORDER — METOPROLOL TARTRATE 50 MG PO TABS: 50.0000 mg | ORAL_TABLET | Freq: Two times a day (BID) | ORAL | Status: DC

## 2020-05-28 MED ORDER — LACTATED RINGERS IV BOLUS (SEPSIS)
1000.0000 mL | Freq: Once | INTRAVENOUS | Status: AC
  Administered 2020-05-28: 1000 mL via INTRAVENOUS

## 2020-05-28 MED ORDER — SODIUM CHLORIDE 0.9 % IV SOLN
1.0000 g | Freq: Three times a day (TID) | INTRAVENOUS | Status: DC
  Administered 2020-05-28: 1 g via INTRAVENOUS
  Filled 2020-05-28 (×5): qty 1

## 2020-05-28 MED ORDER — MORPHINE SULFATE (PF) 4 MG/ML IV SOLN
4.0000 mg | Freq: Once | INTRAVENOUS | Status: AC
  Administered 2020-05-28: 4 mg via INTRAVENOUS
  Filled 2020-05-28: qty 1

## 2020-05-28 MED ORDER — MONTELUKAST SODIUM 10 MG PO TABS
10.0000 mg | ORAL_TABLET | Freq: Every day | ORAL | Status: DC
  Administered 2020-06-01 – 2020-06-02 (×2): 10 mg via ORAL
  Filled 2020-05-28 (×2): qty 1

## 2020-05-28 MED ORDER — SODIUM CHLORIDE 0.9 % IV SOLN: 2.0000 g | Freq: Once | INTRAVENOUS | Status: DC

## 2020-05-28 MED ORDER — ALPRAZOLAM 0.5 MG PO TABS: 0.5000 mg | ORAL_TABLET | Freq: Two times a day (BID) | ORAL | Status: DC | PRN

## 2020-05-28 MED ORDER — POTASSIUM CHLORIDE 10 MEQ/100ML IV SOLN
10.0000 meq | INTRAVENOUS | Status: AC
  Administered 2020-05-28 (×4): 10 meq via INTRAVENOUS
  Filled 2020-05-28 (×3): qty 100

## 2020-05-28 MED ORDER — ONDANSETRON HCL 4 MG PO TABS: 4.0000 mg | ORAL_TABLET | Freq: Four times a day (QID) | ORAL | Status: DC | PRN

## 2020-05-28 MED ORDER — ENALAPRILAT 1.25 MG/ML IV SOLN
0.6250 mg | Freq: Four times a day (QID) | INTRAVENOUS | Status: DC
  Administered 2020-05-28: 0.625 mg via INTRAVENOUS
  Filled 2020-05-28 (×4): qty 0.5

## 2020-05-28 MED ORDER — CARBAMAZEPINE 200 MG PO TABS
600.0000 mg | ORAL_TABLET | Freq: Every day | ORAL | Status: DC
  Filled 2020-05-28: qty 3

## 2020-05-28 MED ORDER — PIPERACILLIN-TAZOBACTAM 3.375 G IVPB
3.3750 g | Freq: Three times a day (TID) | INTRAVENOUS | Status: DC
  Administered 2020-05-28 – 2020-05-30 (×5): 3.375 g via INTRAVENOUS
  Filled 2020-05-28 (×6): qty 50

## 2020-05-28 MED ORDER — SPIRONOLACTONE 25 MG PO TABS: 25.0000 mg | ORAL_TABLET | Freq: Every day | ORAL | Status: DC

## 2020-05-28 MED ORDER — HYDRALAZINE HCL 20 MG/ML IJ SOLN
10.0000 mg | INTRAMUSCULAR | Status: DC | PRN
  Administered 2020-05-28 – 2020-05-31 (×6): 10 mg via INTRAVENOUS
  Filled 2020-05-28 (×6): qty 1

## 2020-05-28 MED ORDER — OMEGA-3-ACID ETHYL ESTERS 1 G PO CAPS: 1.0000 g | ORAL_CAPSULE | Freq: Every day | ORAL | Status: DC

## 2020-05-28 MED ORDER — FENTANYL CITRATE (PF) 100 MCG/2ML IJ SOLN
6.2500 ug | Freq: Once | INTRAMUSCULAR | Status: AC
  Administered 2020-05-28: 6.5 ug via INTRAVENOUS
  Filled 2020-05-28: qty 2

## 2020-05-28 MED ORDER — ACETAMINOPHEN 325 MG PO TABS
650.0000 mg | ORAL_TABLET | Freq: Four times a day (QID) | ORAL | Status: DC | PRN
  Administered 2020-06-02: 650 mg via ORAL
  Filled 2020-05-28: qty 2

## 2020-05-28 MED ORDER — BENZOCAINE 20 % MT AERO
INHALATION_SPRAY | Freq: Once | OROMUCOSAL | Status: AC
  Administered 2020-05-28: 1 via OROMUCOSAL
  Filled 2020-05-28: qty 57

## 2020-05-28 MED ORDER — MAGNESIUM HYDROXIDE 400 MG/5ML PO SUSP: 30.0000 mL | Freq: Every day | ORAL | Status: DC | PRN

## 2020-05-28 MED ORDER — SERTRALINE HCL 50 MG PO TABS: 100.0000 mg | ORAL_TABLET | Freq: Every day | ORAL | Status: DC

## 2020-05-28 MED ORDER — CARBAMAZEPINE 200 MG PO TABS
400.0000 mg | ORAL_TABLET | Freq: Every day | ORAL | Status: DC
  Filled 2020-05-28: qty 2

## 2020-05-28 MED ORDER — ONDANSETRON HCL 4 MG/2ML IJ SOLN
4.0000 mg | Freq: Four times a day (QID) | INTRAMUSCULAR | Status: DC | PRN
  Administered 2020-05-28 – 2020-06-01 (×8): 4 mg via INTRAVENOUS
  Filled 2020-05-28 (×8): qty 2

## 2020-05-28 MED ORDER — SODIUM CHLORIDE 0.9 % IV BOLUS
500.0000 mL | Freq: Once | INTRAVENOUS | Status: AC
  Administered 2020-05-28: 500 mL via INTRAVENOUS

## 2020-05-28 MED ORDER — POTASSIUM CHLORIDE 20 MEQ PO PACK: 40.0000 meq | PACK | Freq: Once | ORAL | Status: DC

## 2020-05-28 MED ORDER — PROMETHAZINE HCL 25 MG/ML IJ SOLN
12.5000 mg | Freq: Once | INTRAMUSCULAR | Status: AC
  Administered 2020-05-28: 12.5 mg via INTRAVENOUS
  Filled 2020-05-28: qty 1

## 2020-05-28 MED ORDER — PANTOPRAZOLE SODIUM 40 MG PO TBEC: 40.0000 mg | DELAYED_RELEASE_TABLET | Freq: Every day | ORAL | Status: DC

## 2020-05-28 NOTE — Progress Notes (Signed)
Pharmacy Antibiotic Note  Peter [name] is a 59 y.o. male admitted on 05/28/2020 with IAI.  Pharmacy has been consulted for Cefepime dosing.  Plan: Cefepime 2gm IV q8hrs Meropenem 1gm IV q8hrs (per MD dosing)  Height: 5\' 7"  (170.2 cm) Weight: 136.1 kg (300 lb) IBW/kg (Calculated) : 66.1  Temp (24hrs), Avg:97.5 F (36.4 C), Min:97.5 F (36.4 C), Max:97.5 F (36.4 C)  Recent Labs  Lab 05/27/20 2225 05/28/20 0124 05/28/20 0248  WBC 12.0*  --   --   CREATININE 0.86  --   --   LATICACIDVEN  --  3.1* 1.7    Estimated Creatinine Clearance: 120.1 mL/min (by C-G formula based on SCr of 0.86 mg/dL).    Allergies  Allergen Reactions  . Oxycodone Other (See Comments)    Causes Hallucinations, auditory and tactile  . Isosorbide Nausea And Vomiting    Headache, nausea, vomiting    Antimicrobials this admission:   >>    >>   Dose adjustments this admission:   Microbiology results:  BCx:   UCx:    Sputum:    MRSA PCR:   Thank you for allowing pharmacy to be a part of this patient's care.  Hart Robinsons A 05/28/2020 5:00 AM

## 2020-05-28 NOTE — ED Provider Notes (Signed)
[facility] Emergency Department Provider Note  ____________________________________________   First MD Initiated Contact with Patient 05/28/20 0103     (approximate)  I have reviewed the triage vital signs and the nursing notes.   HISTORY  Chief Complaint Abdominal Pain    HPI Peter [name] is a 59 y.o. male with medical history as listed below which notably includes multiple prior abdominal surgeries including cholecystectomy and appendectomy as well as a ventral hernia surgery.  He presents tonight for evaluation of acute onset and severe generalized sharp and burning abdominal pain that is most notable in the upper part of his abdomen.  It is associated with persistent nausea and 2 episodes of emesis.  It started shortly after he ate dinner.  Nothing in particular makes it better or worse and he continues to rated as severe.  He denies chest pain, shortness of breath, sore throat, fever/chills, dysuria, and diarrhea.   He said it is much worse than pain he has experienced in the past.  He does not believe he has ever had a bowel obstruction.        Past Medical History:  Diagnosis Date  . Bipolar disorder ([place])   . Blood in stool   . Difficult intubation   . Dyspnea   . GERD (gastroesophageal reflux disease)   . Glaucoma    since 2004  . Hypertension    since 2000  . Obesity, unspecified   . Sleep apnea   . Ulcer 2011   gastric  . Unspecified hemorrhoids without mention of complication     Patient Active Problem List   Diagnosis Date Noted  . SBO (small bowel obstruction) ([place]) 05/28/2020  . Recurrent major depressive disorder, in partial remission ([place]) 11/14/2019  . Dysphagia   . Colon cancer screening   . Mild cognitive impairment 06/04/2019  . History of arthroplasty of left shoulder 03/26/2019  . History of total knee arthroplasty 03/21/2019  . GERD (gastroesophageal reflux disease) 12/16/2018  . Erectile dysfunction 09/14/2017   . Open abdominal incision with drainage   . 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 Necrotizing soft tissue infection 11/11/2016  . Class 3 severe obesity due to excess calories with serious comorbidity and body mass index (BMI) of 45.0 to 49.9 in adult ([place]) 09/21/2016  . Decreased libido 09/21/2016  . Shortness of breath 06/15/2016  . Abnormal stress test 06/15/2016  . 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 Avitaminosis D 03/25/2015  . Blood in stool   . Clinical depression 06/21/2011  . Cannot sleep 08/22/2009  . Hypercholesterolemia without hypertriglyceridemia 01/28/2009  . Hay fever 10/22/2008  . Adjustment disorder with mixed anxiety and depressed mood 12/12/2007  . Essential (primary) hypertension 12/12/2007  . Apnea, sleep 12/12/2007    Past Surgical History:  Procedure Laterality Date  . APPLICATION OF WOUND VAC N/A 11/16/2016   Procedure: APPLICATION OF WOUND VAC-ABDOMINAL;  Surgeon: Florene Glen, MD;  Location: [facility] ORS;  Service: General;  Laterality: N/A;  . APPLICATION OF WOUND VAC N/A 11/18/2016   Procedure: APPLICATION OF WOUND VAC;  Surgeon: Florene Glen, MD;  Location: [facility] ORS;  Service: General;  Laterality: N/A;  change  . APPLICATION OF WOUND VAC N/A 11/23/2016   Procedure: WOUND VAC CHANGE;  Surgeon: Olean Ree, MD;  Location: [facility] ORS;  Service: General;  Laterality: N/A;  wound vac application  . CARDIAC CATHETERIZATION Left 06/23/2016   Procedure: Left Heart Cath and Coronary Angiography;  Surgeon: Nelva Bush, MD;  Location:  [facility];  Service: Cardiovascular;  Laterality: Left;  . CHOLECYSTECTOMY  1992  . COLONOSCOPY W/ POLYPECTOMY  05/22/2010   29mmtransverse colon polyp, traditional serrated adenoma,negative for high grade dysplasia & malignancy. rectal polyp-38mm,negative for dysplasia & malignancy.  . COLONOSCOPY WITH PROPOFOL N/A 06/20/2019   Procedure: COLONOSCOPY WITH PROPOFOL;  Surgeon: Lin Landsman, MD;  Location: [facility]  ENDOSCOPY;  Service: Gastroenterology;  Laterality: N/A;  . COLONOSCOPY WITH PROPOFOL N/A 06/21/2019   Procedure: COLONOSCOPY WITH PROPOFOL;  Surgeon: Lin Landsman, MD;  Location: [facility] ENDOSCOPY;  Service: Gastroenterology;  Laterality: N/A;  . DRESSING CHANGE UNDER ANESTHESIA N/A 11/21/2016   Procedure: DRESSING CHANGE UNDER ANESTHESIA;  Surgeon: Florene Glen, MD;  Location: [facility] ORS;  Service: General;  Laterality: N/A;  . ESOPHAGOGASTRODUODENOSCOPY (EGD) WITH PROPOFOL N/A 06/20/2019   Procedure: ESOPHAGOGASTRODUODENOSCOPY (EGD) WITH PROPOFOL;  Surgeon: Lin Landsman, MD;  Location: [facility] ENDOSCOPY;  Service: Gastroenterology;  Laterality: N/A;  . KNEE SURGERY Left 2010  . LAPAROSCOPIC APPENDECTOMY N/A 10/30/2016   Procedure: APPENDECTOMY LAPAROSCOPIC changed  to open application of wound vac;  Surgeon: [name], MD;  Location: [facility] ORS;  Service: General;  Laterality: N/A;  . LAPAROTOMY N/A 11/11/2016   Procedure: EXPLORATORY LAPAROTOMY, DEBRIDEMENT OF ABDOMINAL WOUND, ABDOMINAL [place];  Surgeon: [name], MD;  Location: [facility] ORS;  Service: General;  Laterality: N/A;  . LAPAROTOMY N/A 11/12/2016   Procedure: EXPLORATORY LAPAROTOMY;  Surgeon: Clayburn Pert, MD;  Location: [facility] ORS;  Service: General;  Laterality: N/A;  . LAPAROTOMY N/A 11/14/2016   Procedure: EXPLORATORY LAPAROTOMY; Irrigation, partial closure;  Surgeon: Clayburn Pert, MD;  Location: [facility] ORS;  Service: General;  Laterality: N/A;  . TOTAL KNEE ARTHROPLASTY Left 03/21/2019   Procedure: TOTAL KNEE ARTHROPLASTY;  Surgeon: Lovell Sheehan, MD;  Location: [facility] ORS;  Service: Orthopedics;  Laterality: Left;    Prior to Admission medications   Medication Sig Start Date End Date Taking? Authorizing Provider  ALPRAZolam Duanne Moron) 0.5 MG tablet TAKE 1 TABLET(0.5 MG) BY MOUTH TWICE DAILY AS NEEDED FOR ANXIETY 05/12/20   Adelene Idler, Helene Kelp T, PA-C  amLODipine (NORVASC) 10 MG tablet TAKE 1 TABLET(10 MG) BY MOUTH  DAILY 05/09/20   Jerrol Banana., MD  aspirin EC 81 MG tablet Take 81 mg by mouth daily.    [provider]  atorvastatin (LIPITOR) 40 MG tablet TAKE 1 TABLET DAILY 12/11/19   Jerrol Banana., MD  azelastine (ASTELIN) 0.1 % nasal spray SMARTSIG:1-2 Spray(s) Both Nares Twice Daily 07/19/19   [provider]  b complex vitamins capsule Take 1 capsule by mouth daily. 04/24/19   Donnal Moat T, PA-C  carbamazepine (TEGRETOL) 200 MG tablet TAKE 3 TABLETS BY MOUTH EVERY MORNING AND 2 TABLETS BY MOUTH EVERY NIGHT AT BEDTIME 03/25/20   Hurst, Dorothea Glassman, PA-C  Cholecalciferol (NAT-RUL VITAMIN D) 25 MCG (1000 UT) tablet Take 1,000 Units by mouth daily.    [provider]  eplerenone (INSPRA) 50 MG tablet Take 50 mg by mouth daily.    [provider]  fluticasone Asencion Islam) 50 MCG/ACT nasal spray SMARTSIG:1-2 Spray(s) Both Nares Daily 07/19/19   [provider]  hydrALAZINE (APRESOLINE) 100 MG tablet TAKE 1 TABLET THREE TIMES A DAY 03/10/20   Jerrol Banana., MD  ibuprofen (ADVIL) 800 MG tablet  07/17/19   [provider]  lamoTRIgine (LAMICTAL) 200 MG tablet TAKE 1 AND 1/2 TABLETS(300 MG) BY MOUTH AT BEDTIME 05/12/20   Addison Lank,  PA-C  latanoprost (XALATAN) 0.005 % ophthalmic solution Apply to eye.    [provider]  losartan (COZAAR) 100 MG tablet TAKE 1 TABLET DAILY 03/10/20   Jerrol Banana., MD  metoprolol tartrate (LOPRESSOR) 50 MG tablet TAKE 1 TABLET TWICE A DAY 03/10/20   Jerrol Banana., MD  montelukast (SINGULAIR) 10 MG tablet Take 10 mg by mouth at bedtime.    [provider]  NARCAN 4 MG/0.1ML LIQD nasal spray kit USE 1 SPRAY IEN UTD PRN 04/05/19   [provider]  NON FORMULARY CPAP    [provider]  Omega-3 Fatty Acids (FISH OIL) 1000 MG CAPS Take by mouth.    [provider]  omeprazole (PRILOSEC) 40 MG capsule Take 1 capsule (40 mg total) by mouth daily before  breakfast. 12/04/19   Lin Landsman, MD  sertraline (ZOLOFT) 100 MG tablet TAKE 1 TABLET DAILY 01/15/20   Jerrol Banana., MD  tamsulosin (FLOMAX) 0.4 MG CAPS capsule Take 1 capsule (0.4 mg total) by mouth daily. 03/17/20   Jerrol Banana., MD  UNABLE TO FIND 900 mg by Per post-pyloric tube route. Med Name: Kyolic Patient not taking: Reported on 12/19/2019    [provider]  vitamin B-12 (CYANOCOBALAMIN) 1000 MCG tablet  07/17/19   [provider]    Allergies Oxycodone and Isosorbide  Family History  Problem Relation Age of Onset  . Cancer Other        colon  . Hyperlipidemia Mother   . Hypertension Mother     Social History Social History   Tobacco Use  . Smoking status: Never Smoker  . Smokeless tobacco: Never Used  Vaping Use  . Vaping Use: Never used  Substance Use Topics  . Alcohol use: Not Currently    Comment: occasionally  . Drug use: No    Review of Systems Constitutional: No fever/chills Eyes: No visual changes. ENT: No sore throat. Cardiovascular: Denies chest pain. Respiratory: Denies shortness of breath. Gastrointestinal: Severe generalized abdominal pain with nausea and vomiting. Genitourinary: Negative for dysuria. Musculoskeletal: Negative for neck pain.  Negative for back pain. Integumentary: Negative for rash. Neurological: Negative for headaches, focal weakness or numbness.   ____________________________________________   PHYSICAL EXAM:  VITAL SIGNS: ED Triage Vitals  Enc Vitals Group     BP 05/27/20 2221 (!) 149/111     Pulse Rate 05/27/20 2221 79     Resp 05/27/20 2221 (!) 22     Temp --      Temp src --      SpO2 05/27/20 2221 98 %     Weight 05/27/20 2222 136.1 kg (300 lb)     Height 05/27/20 2222 1.702 m ($Remove'5\' 7"'qnhCPeg$ )     Head Circumference --      Peak Flow --      Pain Score 05/27/20 2221 8     Pain Loc --      Pain Edu? --      Excl. in [place]? --     Constitutional: Alert and oriented.  Appears  to be in great discomfort. Eyes: Conjunctivae are normal.  Head: Atraumatic. ose: No congestion/rhinnorhea. Mouth/Throat: Patient is wearing a mask. Neck: No stridor.  No meningeal signs.   Cardiovascular: Normal rate, regular rhythm. Good peripheral circulation. Grossly normal heart sounds. Respiratory: Normal respiratory effort.  No retractions. Gastrointestinal: Obese.  Soft and not obviously distended.  Generalized tenderness to palpation throughout the abdomen with no local peritonitis.  Musculoskeletal: No lower extremity tenderness nor edema. No gross deformities of extremities. Neurologic:  Normal speech and language. No gross focal neurologic deficits are appreciated.  Skin:  Skin is warm, dry and intact. Psychiatric: Mood and affect are normal. Speech and behavior are normal.  ____________________________________________   LABS (all labs ordered are listed, but only abnormal results are displayed)  Labs Reviewed  COMPREHENSIVE METABOLIC PANEL - Abnormal; Notable for the following components:      Result Value   Potassium 3.1 (*)    Glucose, Bld 138 (*)    Anion gap 16 (*)    All other components within normal limits  CBC - Abnormal; Notable for the following components:   WBC 12.0 (*)    All other components within normal limits  URINALYSIS, COMPLETE (UACMP) WITH MICROSCOPIC - Abnormal; Notable for the following components:   Color, Urine YELLOW (*)    APPearance CLOUDY (*)    Specific Gravity, Urine 1.043 (*)    Protein, ur 30 (*)    All other components within normal limits  LACTIC ACID, PLASMA - Abnormal; Notable for the following components:   Lactic Acid, Venous 3.1 (*)    All other components within normal limits  TROPONIN I (HIGH SENSITIVITY) - Abnormal; Notable for the following components:   Troponin I (High Sensitivity) 22 (*)    All other components within normal limits  TROPONIN I (HIGH SENSITIVITY) - Abnormal; Notable for the following components:    Troponin I (High Sensitivity) 21 (*)    All other components within normal limits  RESP PANEL BY RT-PCR (FLU A&B, COVID) ARPGX2  CULTURE, BLOOD (SINGLE)  LIPASE, BLOOD  LACTIC ACID, PLASMA   ____________________________________________  EKG  ED ECG REPORT I, Hinda Kehr, the attending physician, personally viewed and interpreted this ECG.  Date: 05/27/2020 EKG Time: 22:17 Rate: 78 Rhythm: normal sinus rhythm QRS Axis: normal Intervals: LVH, otherwise normal ST/T Wave abnormalities: Non-specific ST segment / T-wave changes, but no clear evidence of acute ischemia. Narrative Interpretation: no definitive evidence of acute ischemia; does not meet STEMI criteria.   ____________________________________________  RADIOLOGY I, Hinda Kehr, personally viewed and evaluated these images (plain radiographs) as part of my medical decision making, as well as reviewing the written report by the radiologist.  ED MD interpretation:  No AAS.  Bowel obstruction with transition point.  Official radiology report(s): CT Angio Chest/Abd/Pel for Dissection W and/or W/WO  Result Date: 05/28/2020 CLINICAL DATA:  Abdominal pain.  Concern for aortic dissection. EXAM: CT ANGIOGRAPHY CHEST, ABDOMEN AND PELVIS TECHNIQUE: Non-contrast CT of the chest was initially obtained. Multidetector CT imaging through the chest, abdomen and pelvis was performed using the standard protocol during bolus administration of intravenous contrast. Multiplanar reconstructed images and MIPs were obtained and reviewed to evaluate the vascular anatomy. CONTRAST:  174mL OMNIPAQUE IOHEXOL 350 MG/ML SOLN COMPARISON:  CT dated June 02, 2017. FINDINGS: CTA CHEST FINDINGS Cardiovascular: The heart size is enlarged. There is no evidence for thoracic aortic dissection. No thoracic aortic aneurysm. There is no significant pericardial effusion. There is no large centrally located pulmonary embolism. Mediastinum/Nodes: -- No mediastinal  lymphadenopathy. -- No hilar lymphadenopathy. -- No axillary lymphadenopathy. -- No supraclavicular lymphadenopathy. -- Normal thyroid gland where visualized. -  Unremarkable esophagus. Lungs/Pleura: Multiple small 4-5 mm pulmonary nodules are noted in the right upper lobe. There is atelectasis at the lung bases. Musculoskeletal: No chest wall abnormality. No bony spinal canal stenosis. Review of the MIP images confirms the above findings. CTA  ABDOMEN AND PELVIS FINDINGS VASCULAR Aorta: Normal caliber aorta without aneurysm, dissection, vasculitis or significant stenosis. Celiac: Patent without evidence of aneurysm, dissection, vasculitis or significant stenosis. SMA: Patent without evidence of aneurysm, dissection, vasculitis or significant stenosis. Renals: Both renal arteries are patent without evidence of aneurysm, dissection, vasculitis, fibromuscular dysplasia or significant stenosis. IMA: Patent without evidence of aneurysm, dissection, vasculitis or significant stenosis. Inflow: Patent without evidence of aneurysm, dissection, vasculitis or significant stenosis. Veins: No obvious venous abnormality within the limitations of this arterial phase study. Review of the MIP images confirms the above findings. NON-VASCULAR Hepatobiliary: The liver is normal. Status post cholecystectomy.There is no biliary ductal dilation. Pancreas: Normal contours without ductal dilatation. No peripancreatic fluid collection. Spleen: Unremarkable. Adrenals/Urinary Tract: --Adrenal glands: Unremarkable. --Right kidney/ureter: No hydronephrosis or radiopaque kidney stones. --Left kidney/ureter: No hydronephrosis or radiopaque kidney stones. --Urinary bladder: Unremarkable. Stomach/Bowel: --Stomach/Duodenum: The stomach is distended. --Small bowel: There is evidence for a moderate to high-grade small bowel obstruction with a transition point at the level of the mid anterior peritoneal cavity. This is likely secondary to  enteroperitoneal adhesions. --Colon: Unremarkable. --Appendix: Surgically absent. Vascular/Lymphatic: Normal course and caliber of the major abdominal vessels. --No retroperitoneal lymphadenopathy. --No mesenteric lymphadenopathy. --No pelvic or inguinal lymphadenopathy. Reproductive: Unremarkable Other: No ascites or free air. Again noted are coarse calcifications involving the anterior abdominal wall. Musculoskeletal. No acute displaced fractures. Review of the MIP images confirms the above findings. IMPRESSION: 1. No acute vascular abnormality. 2. There is evidence for a moderate to high-grade small bowel obstruction with a transition point at the level of the mid anterior peritoneal cavity. This is likely secondary to enteroperitoneal adhesions. 3. Cardiomegaly. 4. Multiple small 4-5 mm pulmonary nodules in the right upper lobe. No follow-up needed if patient is low-risk (and has no known or suspected primary neoplasm). Non-contrast chest CT can be considered in 12 months if patient is high-risk. This recommendation follows the consensus statement: Guidelines for Management of Incidental Pulmonary Nodules Detected on CT Images: From the Fleischner Society 2017; Radiology 2017; 284:228-243. Electronically Signed   By: Constance Holster M.D.   On: 05/28/2020 02:14    ____________________________________________   PROCEDURES   Procedure(s) performed (including Critical Care):  .1-3 Lead EKG Interpretation Performed by: Hinda Kehr, MD Authorized by: Hinda Kehr, MD     Interpretation: normal     ECG rate:  80   ECG rate assessment: normal     Rhythm: sinus rhythm     Ectopy: none   .Critical Care Performed by: Hinda Kehr, MD Authorized by: Hinda Kehr, MD   Critical care provider statement:    Critical care time (minutes):  45   Critical care time was exclusive of:  Separately billable procedures and treating other patients   Critical care was necessary to treat or prevent  imminent or life-threatening deterioration of the following conditions:  Sepsis   Critical care was time spent personally by me on the following activities:  Development of treatment plan with patient or surrogate, discussions with consultants, evaluation of patient's response to treatment, examination of patient, obtaining history from patient or surrogate, ordering and performing treatments and interventions, ordering and review of laboratory studies, ordering and review of radiographic studies, pulse oximetry, re-evaluation of patient's condition and review of old charts     ____________________________________________   [place] / MDM / [place] / ED COURSE  As part of my medical decision making, I reviewed the following data within the [place]  Nursing notes reviewed and incorporated, Labs reviewed , EKG interpreted , Old chart reviewed, Discussed with admitting physician (Dr. Arville Care), Discussed with surgeon (Dr. Everlene Farrier) and reviewed Notes from prior ED visits   Differential diagnosis includes, but is not limited to, SBO, ileus, volvulus, nonspecific acute intra-abdominal infection, AAS.  Reflux or ulcer is possible but less likely.  Pancreatitis is also possible but the patient's lipase is normal.  Choledocholithiasis is also possible but the patient is status post cholecystectomy and has normal LFTs.  The patient is on the cardiac monitor to evaluate for evidence of arrhythmia and/or significant heart rate changes.  Vital signs are stable other than some mild tachypnea which I suspect is due to his pain and he is hypertensive but this does not seem to be the primary issue unless it is related to aortic disease.  Comprehensive metabolic panel is essentially normal other than some mild hypokalemia and a very slightly elevated anion gap.  High-sensitivity troponin is 22 which may point more towards acute aortic syndrome than ACS.  He has no evidence of  ischemia on his EKG.  CBC is essentially normal other than a mild leukocytosis of 12.  Repeat troponin will need to be collected.    Given the patient's comorbidities, body habitus, and history of present illness, I am most concerned about the possibility of a bowel obstruction, but AAS would be a potentially deadly diagnosis that would also explain his symptoms.  I will evaluate with a CTA chest/abdomen/pelvis to rule out acute aortic syndrome as well as to further evaluate for the possibility of obstruction or other acute intra-abdominal pathology.  He is getting morphine 4 mg IV and Zofran 4 mg IV as well as a small fluid bolus given his volume loss through vomiting and his mild anion gap.  Clinical Course as of May 28 250  Wed May 28, 2020  0226 No significant change in troponin  Troponin I (High Sensitivity)(!): 21 [CF]  0226 The patient is noted to have a lactate>3. With the current information available to me, I don't think the patient is in severe sepsis. The lactate>3, is related to bowel obstruction with N/V.  However, given the mild leukocytosis and elevated lactic acid I called and spoke with Dr. Everlene Farrier to ask him to review the images and make sure he does not feel that this is a surgical emergency.  He is going back.  I also ask about the possibility of empiric antibiotics and we will discuss that when he returns my call.  Lactic Acid, Venous(!!): 3.1 [CF]  0230 As previously described, no AAS, however concerning for bowel obstruction likely secondary to adhesions.  CT Angio Chest/Abd/Pel for Dissection W and/or W/WO [CF]  0235 I discussed the case by phone with Dr. Everlene Farrier after he reviewed the images.  He does not feel that there is anything unusual or abnormal about the bowel obstruction and that there is no evidence of bowel ischemia.  I explained that with the patient meeting criteria for severe sepsis based on his lactic acid of 3.1, leukocytosis, tachypnea, and some evidence of  endorgan dysfunction with an elevated troponin level, I am going to treat him as severe sepsis and Dr. Everlene Farrier agreed. I am consulting the hospitalist for admission.Given the national shortage of metronidazole, I ordered meropenem 1 g IV for empiric antibiotics.  The patient has already received 500 mL of lactated Ringer's and I ordered an additional 2 L lactated Ringer bolus based on the patient's ideal body  weight.   [CF]  3818 I discussed the case by phone with Dr. Sidney Ace with the hospitalist service who will admit.  I also updated the patient about the diagnosis.  He feels better but still with some persistent nausea.  I explained the need for NG tube placement and he understands and agrees.  I have also touch base with the nursing staff and they understand the plan.   [CF]    Clinical Course User Index [CF] Hinda Kehr, MD     ____________________________________________  FINAL CLINICAL IMPRESSION(S) / ED DIAGNOSES  Final diagnoses:  Complete intestinal obstruction, unspecified cause ([place])  Severe sepsis ([facility])  Elevated troponin level  Pulmonary nodule  Elevated lactic acid level     MEDICATIONS GIVEN DURING THIS VISIT:  Medications  lactated ringers bolus 1,000 mL (1,000 mLs Intravenous New Bag/Given 05/28/20 0244)    And  lactated ringers bolus 1,000 mL (has no administration in time range)  meropenem (MERREM) 1 g in sodium chloride 0.9 % 100 mL IVPB (has no administration in time range)  ondansetron (ZOFRAN-ODT) disintegrating tablet 4 mg (4 mg Oral Given 05/27/20 2229)  morphine 4 MG/ML injection 4 mg (4 mg Intravenous Given 05/28/20 0122)  ondansetron (ZOFRAN) injection 4 mg (4 mg Intravenous Given 05/28/20 0121)  sodium chloride 0.9 % bolus 500 mL (0 mLs Intravenous Stopped 05/28/20 0152)  iohexol (OMNIPAQUE) 350 MG/ML injection 125 mL (125 mLs Intravenous Contrast Given 05/28/20 0146)     ED Discharge Orders    None      *Please note:  Peter [name] was  evaluated in Emergency Department on 05/28/2020 for the symptoms described in the history of present illness. He was evaluated in the context of the global COVID-19 pandemic, which necessitated consideration that the patient might be at risk for infection with the SARS-CoV-2 virus that causes COVID-19. Institutional protocols and algorithms that pertain to the evaluation of patients at risk for COVID-19 are in a state of rapid change based on information released by regulatory bodies including the CDC and federal and state organizations. These policies and algorithms were followed during the patient's care in the ED.  Some ED evaluations and interventions may be delayed as a result of limited staffing during and after the pandemic.*  Note:  This document was prepared using Dragon voice recognition software and may include unintentional dictation errors.   Hinda Kehr, MD 05/28/20 [phone]

## 2020-05-28 NOTE — Progress Notes (Signed)
PHARMACIST - PHYSICIAN COMMUNICATION  CONCERNING:  Enoxaparin (Lovenox) for DVT Prophylaxis    RECOMMENDATION: Patient was prescribed enoxaprin 40mg  q24 hours for VTE prophylaxis.   Filed Weights   05/27/20 2222  Weight: 136.1 kg (300 lb)    Body mass index is 46.99 kg/m.  Estimated Creatinine Clearance: 120.1 mL/min (by C-G formula based on SCr of 0.86 mg/dL).  Based on [place] patient is candidate for enoxaparin 0.5mg /kg TBW SQ every 24 hours based on BMI being >30.  DESCRIPTION: Pharmacy has adjusted enoxaparin dose per [facility] policy.  Patient is now receiving enoxaparin 70 mg every 24 hours    Ena Dawley, PharmD Clinical Pharmacist  05/28/2020 5:36 AM

## 2020-05-28 NOTE — Sepsis Progress Note (Signed)
Following for Code Sepsis Monitoring

## 2020-05-28 NOTE — Progress Notes (Signed)
Awaiting a return call from pt spouse for verification. All medications have recent fills from pharmacy records in the past 30 days and also match the last dr visit medication list.

## 2020-05-28 NOTE — Progress Notes (Signed)
CODE SEPSIS - PHARMACY COMMUNICATION  **Broad Spectrum Antibiotics should be administered within 1 hour of Sepsis diagnosis**  Time Code Sepsis Called/Page Received: 0236  Antibiotics Ordered: Meropenem  Time of 1st antibiotic administration: 0432  Additional action taken by pharmacy: messaged nursing  If necessary, Name of Provider/Nurse Contacted:     Ena Dawley ,PharmD Clinical Pharmacist  05/28/2020  2:43 AM

## 2020-05-28 NOTE — Consult Note (Signed)
[facility] SURGICAL CONSULTATION NOTE (initial) - cpt: 50932   HISTORY OF PRESENT ILLNESS (HPI):  59 y.o. male presented to [facility] ED overnight for evaluation of abdominal pain. Patient reports the acute onset of abdominal pain in the upper portion of his abdomen. This was described to be a sharp and burning pain. He believes this onset just shortly after eating dinner last night. He noted associated nausea and two episodes of non-bloody and non-bilious emesis. No fever, chills, cough, CP, SOB, or urinary changes. He has had similar abdominal pain in the past but this is much worse. He denied any history of obstruction in the past. Patient does have an extensive surgical history including open appendectomy in 6712 complicated by evisceration and SBO requiring multiple take back surgeries. Patient initially with leukocytosis to 12.0k (now 9.0k), renal function was normal with sCr - 0.79, mild hypokalemia to 3.1 (now 3.0), and he did have a lactic acidosis in the ED to 3.1 (resolved with IVF - 1.7). CTA Chest/Abdomen/Pelvis was obtained and concerning for small bowel obstruction. Given that he met sepsis criteria, he was started on Meropenem; however, there was no concern for bowel compromise nor ischemia on imaging review. He was admitted to medicine service. NGT placement could not be completed secondary to "obstruction" and planned placement with IR today.   Surgery is consulted by emergency medicine physician Dr. Hinda Kehr, MD in this context for evaluation and management of SBO.  PAST MEDICAL HISTORY (PMH):  Past Medical History:  Diagnosis Date  . Bipolar disorder ([place])   . Blood in stool   . Difficult intubation   . Dyspnea   . GERD (gastroesophageal reflux disease)   . Glaucoma    since 2004  . Hypertension    since 2000  . Obesity, unspecified   . Sleep apnea   . Ulcer 2011   gastric  . Unspecified hemorrhoids without mention of complication      PAST SURGICAL  HISTORY ([place]):  Past Surgical History:  Procedure Laterality Date  . APPLICATION OF WOUND VAC N/A 11/16/2016   Procedure: APPLICATION OF WOUND VAC-ABDOMINAL;  Surgeon: Florene Glen, MD;  Location: [facility]S;  Service: General;  Laterality: N/A;  . APPLICATION OF WOUND VAC N/A 11/18/2016   Procedure: APPLICATION OF WOUND VAC;  Surgeon: Florene Glen, MD;  Location: [facility];  Service: General;  Laterality: N/A;  change  . APPLICATION OF WOUND VAC N/A 11/23/2016   Procedure: WOUND VAC CHANGE;  Surgeon: Olean Ree, MD;  Location: [facility];  Service: General;  Laterality: N/A;  wound vac application  . CARDIAC CATHETERIZATION Left 06/23/2016   Procedure: Left Heart Cath and Coronary Angiography;  Surgeon: Nelva Bush, MD;  Location: [facility];  Service: Cardiovascular;  Laterality: Left;  . CHOLECYSTECTOMY  1992  . COLONOSCOPY W/ POLYPECTOMY  05/22/2010   30mmtransverse colon polyp, traditional serrated adenoma,negative for high grade dysplasia & malignancy. rectal polyp-75mm,negative for dysplasia & malignancy.  . COLONOSCOPY WITH PROPOFOL N/A 06/20/2019   Procedure: COLONOSCOPY WITH PROPOFOL;  Surgeon: Lin Landsman, MD;  Location: [facility] ENDOSCOPY;  Service: Gastroenterology;  Laterality: N/A;  . COLONOSCOPY WITH PROPOFOL N/A 06/21/2019   Procedure: COLONOSCOPY WITH PROPOFOL;  Surgeon: Lin Landsman, MD;  Location: [facility] ENDOSCOPY;  Service: Gastroenterology;  Laterality: N/A;  . DRESSING CHANGE UNDER ANESTHESIA N/A 11/21/2016   Procedure: DRESSING CHANGE UNDER ANESTHESIA;  Surgeon: Florene Glen, MD;  Location: [facility];  Service: General;  Laterality: N/A;  .  ESOPHAGOGASTRODUODENOSCOPY (EGD) WITH PROPOFOL N/A 06/20/2019   Procedure: ESOPHAGOGASTRODUODENOSCOPY (EGD) WITH PROPOFOL;  Surgeon: Lin Landsman, MD;  Location: [facility];  Service: Gastroenterology;  Laterality: N/A;  . KNEE SURGERY Left 2010  . LAPAROSCOPIC APPENDECTOMY N/A 10/30/2016    Procedure: APPENDECTOMY LAPAROSCOPIC changed  to open application of wound vac;  Surgeon: [name], MD;  Location: [facility];  Service: General;  Laterality: N/A;  . LAPAROTOMY N/A 11/11/2016   Procedure: EXPLORATORY LAPAROTOMY, DEBRIDEMENT OF ABDOMINAL WOUND, ABDOMINAL [place];  Surgeon: [name], MD;  Location: [facility];  Service: General;  Laterality: N/A;  . LAPAROTOMY N/A 11/12/2016   Procedure: EXPLORATORY LAPAROTOMY;  Surgeon: Clayburn Pert, MD;  Location: [facility];  Service: General;  Laterality: N/A;  . LAPAROTOMY N/A 11/14/2016   Procedure: EXPLORATORY LAPAROTOMY; Irrigation, partial closure;  Surgeon: Clayburn Pert, MD;  Location: [facility];  Service: General;  Laterality: N/A;  . TOTAL KNEE ARTHROPLASTY Left 03/21/2019   Procedure: TOTAL KNEE ARTHROPLASTY;  Surgeon: Lovell Sheehan, MD;  Location: [facility];  Service: Orthopedics;  Laterality: Left;     MEDICATIONS:  Prior to Admission medications   Medication Sig Start Date End Date Taking? Authorizing Provider  ALPRAZolam (XANAX) 0.5 MG tablet TAKE 1 TABLET(0.5 MG) BY MOUTH TWICE DAILY AS NEEDED FOR ANXIETY 05/12/20  Yes Hurst, Teresa T, PA-C  amLODipine (NORVASC) 10 MG tablet TAKE 1 TABLET(10 MG) BY MOUTH DAILY 05/09/20  Yes Jerrol Banana., MD  aspirin EC 81 MG tablet Take 81 mg by mouth daily.   Yes [provider]  atorvastatin (LIPITOR) 40 MG tablet TAKE 1 TABLET DAILY 12/11/19  Yes Jerrol Banana., MD  azelastine (ASTELIN) 0.1 % nasal spray SMARTSIG:1-2 Spray(s) Both Nares Twice Daily 07/19/19  Yes [provider]  b complex vitamins capsule Take 1 capsule by mouth daily. 04/24/19  Yes Hurst, Teresa T, PA-C  carbamazepine (TEGRETOL) 200 MG tablet TAKE 3 TABLETS BY MOUTH EVERY MORNING AND 2 TABLETS BY MOUTH EVERY NIGHT AT BEDTIME 03/25/20  Yes Hurst, Teresa T, PA-C  Cholecalciferol (NAT-RUL VITAMIN D) 25 MCG (1000 UT) tablet Take 1,000 Units by mouth daily.   Yes [provider]   eplerenone (INSPRA) 50 MG tablet Take 50 mg by mouth daily.   Yes [provider]  fluticasone (FLONASE) 50 MCG/ACT nasal spray SMARTSIG:1-2 Spray(s) Both Nares Daily 07/19/19  Yes [provider]  hydrALAZINE (APRESOLINE) 100 MG tablet TAKE 1 TABLET THREE TIMES A DAY 03/10/20  Yes Jerrol Banana., MD  lamoTRIgine (LAMICTAL) 200 MG tablet TAKE 1 AND 1/2 TABLETS(300 MG) BY MOUTH AT BEDTIME 05/12/20  Yes Hurst, Teresa T, PA-C  latanoprost (XALATAN) 0.005 % ophthalmic solution Place 1 drop into both eyes at bedtime.    Yes [provider]  losartan (COZAAR) 100 MG tablet TAKE 1 TABLET DAILY 03/10/20  Yes Jerrol Banana., MD  metoprolol tartrate (LOPRESSOR) 50 MG tablet TAKE 1 TABLET TWICE A DAY 03/10/20  Yes Jerrol Banana., MD  montelukast (SINGULAIR) 10 MG tablet Take 10 mg by mouth at bedtime.   Yes [provider]  NON FORMULARY CPAP   Yes [provider]  Omega-3 Fatty Acids (FISH OIL) 1000 MG CAPS Take 1 capsule by mouth daily.    Yes [provider]  omeprazole (PRILOSEC) 40 MG capsule Take 1 capsule (40 mg total) by mouth daily before breakfast. 12/04/19  Yes Vanga, Tally Due, MD  sertraline (ZOLOFT) 100 MG tablet  TAKE 1 TABLET DAILY 01/15/20  Yes Maple Hudson., MD  tamsulosin [facility]) 0.4 MG CAPS capsule Take 1 capsule (0.4 mg total) by mouth daily. 03/17/20  Yes Maple Hudson., MD  vitamin B-12 (CYANOCOBALAMIN) 1000 MCG tablet Take 1,000 mcg by mouth daily.  07/17/19  Yes [provider]  NARCAN 4 MG/0.1ML LIQD nasal spray kit USE 1 SPRAY IEN UTD PRN 04/05/19   [provider]  UNABLE TO FIND 900 mg by Per post-pyloric tube route. Med Name: Kyolic Patient not taking: Reported on 12/19/2019    [provider]     ALLERGIES:  Allergies  Allergen Reactions  . Oxycodone Other (See Comments)    Causes Hallucinations, auditory and tactile  . Isosorbide Nausea And Vomiting     Headache, nausea, vomiting     SOCIAL HISTORY:  Social History   Socioeconomic History  . Marital status: Married    Spouse name: Not on file  . Number of children: Not on file  . Years of education: Not on file  . Highest education level: Not on file  Occupational History  . Not on file  Tobacco Use  . Smoking status: Never Smoker  . Smokeless tobacco: Never Used  Vaping Use  . Vaping Use: Never used  Substance and Sexual Activity  . Alcohol use: Not Currently    Comment: occasionally  . Drug use: No  . Sexual activity: Not on file  Other Topics Concern  . Not on file  Social History Narrative  . Not on file   Social Determinants of Health   Financial Resource Strain:   . Difficulty of Paying Living Expenses: Not on file  Food Insecurity:   . Worried About Programme researcher, broadcasting/film/video in the Last Year: Not on file  . Ran Out of Food in the Last Year: Not on file  Transportation Needs:   . Lack of Transportation (Medical): Not on file  . Lack of Transportation (Non-Medical): Not on file  Physical Activity:   . Days of Exercise per Week: Not on file  . Minutes of Exercise per Session: Not on file  Stress:   . Feeling of Stress : Not on file  Social Connections:   . Frequency of Communication with Friends and Family: Not on file  . Frequency of Social Gatherings with Friends and Family: Not on file  . Attends Religious Services: Not on file  . Active Member of Clubs or Organizations: Not on file  . Attends Banker Meetings: Not on file  . Marital Status: Not on file  Intimate Partner Violence:   . Fear of Current or Ex-Partner: Not on file  . Emotionally Abused: Not on file  . Physically Abused: Not on file  . Sexually Abused: Not on file     FAMILY HISTORY:  Family History  Problem Relation Age of Onset  . Cancer Other        colon  . Hyperlipidemia Mother   . Hypertension Mother       REVIEW OF SYSTEMS:  Review of Systems  Constitutional:  Negative for chills and fever.  Respiratory: Negative for cough and shortness of breath.   Cardiovascular: Negative for chest pain and palpitations.  Gastrointestinal: Positive for abdominal pain, nausea and vomiting.  Genitourinary: Negative for dysuria and urgency.  All other systems reviewed and are negative.   VITAL SIGNS:  Temp:  [97.5 F (36.4 C)] 97.5 F (36.4 C) (11/24 0402) Pulse Rate:  [79-86]  86 (11/24 0402) Resp:  [21-22] 21 (11/24 0402) BP: (149-183)/(95-111) 182/95 (11/24 0645) SpO2:  [94 %-98 %] 94 % (11/24 0402) Weight:  [136.1 kg-142.7 kg] 142.7 kg (11/24 0402)     Height: $Remove'5\' 7"'sTNCQFe$  (170.2 cm) Weight: (!) 142.7 kg BMI (Calculated): 49.26   INTAKE/OUTPUT:  11/23 0701 - 11/24 0700 In: 3760 [IV Piggyback:3760] Out: 700 [Urine:200; Emesis/NG output:500]  PHYSICAL EXAM:  Physical Exam Vitals and nursing note reviewed.  Constitutional:      General: He is not in acute distress.    Appearance: He is well-developed. He is obese. He is not ill-appearing.  HENT:     Head: Normocephalic and atraumatic.  Eyes:     General: No scleral icterus.    Extraocular Movements: Extraocular movements intact.  Cardiovascular:     Rate and Rhythm: Normal rate and regular rhythm.     Heart sounds: Normal heart sounds. No murmur heard.   Pulmonary:     Effort: Pulmonary effort is normal. No respiratory distress.  Abdominal:     General: Abdomen is protuberant. There is no distension.     Palpations: Abdomen is soft.     Tenderness: There is generalized abdominal tenderness. There is no guarding or rebound.     Comments: Abdomen is soft, obese, generalized tenderness (unable to localize), difficult to ascertain distension given body habitus, previous placed mesh palpable, no rebound/guarding. No evidence of overt peritonitis on examination.   Genitourinary:    Comments: Deferred Skin:    General: Skin is warm and dry.     Findings: No erythema or rash.  Neurological:     General:  No focal deficit present.     Mental Status: He is alert and oriented to person, place, and time.  Psychiatric:        Mood and Affect: Mood normal.        Behavior: Behavior normal.      Labs:  CBC Latest Ref Rng & Units 05/28/2020 05/27/2020 03/25/2020  WBC 4.0 - 10.5 K/uL 9.0 12.0(H) 5.1  Hemoglobin 13.0 - 17.0 g/dL 15.1 15.6 14.0  Hematocrit 39 - 52 % 43.8 46.4 42.5  Platelets 150 - 400 K/uL 213 227 183   CMP Latest Ref Rng & Units 05/28/2020 05/27/2020 03/25/2020  Glucose 70 - 99 mg/dL 118(H) 138(H) 84  BUN 8 - 23 mg/dL $Remove'15 18 15  'IftpYGj$ Creatinine 0.61 - 1.24 mg/dL 0.79 0.86 0.92  Sodium 135 - 145 mmol/L 140 141 143  Potassium 3.5 - 5.1 mmol/L 3.0(L) 3.1(L) 3.4(L)  Chloride 98 - 111 mmol/L 101 98 104  CO2 22 - 32 mmol/L $RemoveB'28 27 25  'TLibycde$ Calcium 8.9 - 10.3 mg/dL 8.8(L) 9.3 8.8  Total Protein 6.5 - 8.1 g/dL - 8.0 6.7  Total Bilirubin 0.3 - 1.2 mg/dL - 1.0 0.4  Alkaline Phos 38 - 126 U/L - 126 141(H)  AST 15 - 41 U/L - 28 24  ALT 0 - 44 U/L - 20 17   Imaging studies:   CTA Chest / Abdomen / Pelvis (05/28/2020) personally reviewed with stomach and small bowel dilation consistent with obstruction with transition in the left mid-abdomen, likely to the anterior abdominal wall, there is mesh seen, no evidence of bowel compromise, ischemia, or free air, and radiologist report reviewed:  IMPRESSION: 1. No acute vascular abnormality. 2. There is evidence for a moderate to high-grade small bowel obstruction with a transition point at the level of the mid anterior peritoneal cavity. This is likely secondary to enteroperitoneal adhesions.  3. Cardiomegaly. 4. Multiple small 4-5 mm pulmonary nodules in the right upper lobe. No follow-up needed if patient is low-risk (and has no known or suspected primary neoplasm). Non-contrast chest CT can be considered in 12 months if patient is high-risk. This recommendation follows the consensus statement: Guidelines for Management of Incidental Pulmonary  Nodules Detected on CT Images: From the Fleischner Society 2017; Radiology 2017; 284:228-243.   Assessment/Plan: (ICD-10's: K62.609) 59 y.o. male with abdominal pain, nausea, and emesis found to have small bowel obstruction most certainly secondary to post-surgical adhesions, complicated by pertinent comorbidities including obesity and numerous extensive abdominal surgeries.    - Agree with placement of NGT; LIS; monitor and record output   - No emergent indication for surgical intervention. Given his surgical history, would recommend be very patient with conservative measures as repeat laparotomy would be very extensive.    - NPO + IVF resuscitation  - Monitor abdominal examination; on-going bowel function  - Pain control prn (minimize narcotics if feasible); antiemetics prn  - Mobilization encouraged   - Further management per primary service; we will follow   All of the above findings and recommendations were discussed with the patient, and all of patient's questions were answered to his expressed satisfaction.  Thank you for the opportunity to participate in this patient's care.   -- Edison Simon, PA-C [facility] 05/28/2020, 7:31 AM [phone] M-F: 7am - 4pm

## 2020-05-28 NOTE — Progress Notes (Signed)
PROGRESS NOTE    PRANEEL HAISLEY  ZMO:[id number] DOB: 1961-04-02 DOA: 05/28/2020 PCP: Jerrol Banana., MD    Brief Narrative:  59 y.o. obese Caucasian male with a known history of hypertension, bipolar disorder, GERD, sleep apnea and peptic ulcer disease, status post multiple laparotomies including cholecystectomy, appendectomy and ventral herniorrhaphy, who presented to the emergency room with acute onset of recurrent nausea and vomiting with associated generalized abdominal pain, found to have SBO. Pt was admitted with General Surgery following  Assessment & Plan:   Active Problems:   SBO (small bowel obstruction) ([place])   1.  Small bowel obstruction like secondary to adhesions from previous multiple laparotomies, with elevated lactic acid. -abd imaging personally reviewed. Findings worrisome for mod to high grade SBO with transition point at the level of the mid-anterior peritoneal cavity, likely secondary to enteroperitoneal adhesions -Pt is kept NPO with NG now placed -Cont to follow and correct electrolytes, goal K >4 and Mg>2  2.  Hypokalemia. -Noted to be low this AM at 3.0 -Will give IV KCl -Repeat bmet in AM  3.  Hypertensive urgency. -Given SBO, hold off on oral home bp meds -Will continue on scheduled IV ACEI and IV beta blocker, titrate as tolerated -Continue on PRN hydralazine  4.  Dyslipidemia. -On statin therapy and Lovaza prior to admit -Will hold oral meds while pt is NPO  5.  Bipolar disorder. -Hold Tegretol and Lamictal while pt is NPO, would resume when able to tolerate PO again  6.  BPH. -Continue to hold Flomax for now  7.  Peptic ulcer disease. -We will place the patient on IV PPI therapy.  8.  DVT prophylaxis. -Subcutaneous Lovenox.  9. OSA -Will continue CPAP  DVT prophylaxis: Lovenox subq Code Status: Full Family Communication: Pt in room, family is at bedside  Status is: Inpatient  Remains inpatient appropriate  because:IV treatments appropriate due to intensity of illness or inability to take PO and Inpatient level of care appropriate due to severity of illness   Dispo: The patient is from: Home              Anticipated d/c is to: Home              Anticipated d/c date is: 3 days              Patient currently is not medically stable to d/c.       Consultants:   General Surgery  IR  Procedures:     Antimicrobials: Anti-infectives (From admission, onward)   Start     Dose/Rate Route Frequency Ordered Stop   05/28/20 1700  piperacillin-tazobactam (ZOSYN) IVPB 3.375 g        3.375 g 12.5 mL/hr over 240 Minutes Intravenous Every 8 hours 05/28/20 1541     05/28/20 0600  ceFEPIme (MAXIPIME) 2 g in sodium chloride 0.9 % 100 mL IVPB  Status:  Discontinued        2 g 200 mL/hr over 30 Minutes Intravenous Every 8 hours 05/28/20 0430 05/28/20 0718   05/28/20 0600  meropenem (MERREM) 1 g in sodium chloride 0.9 % 100 mL IVPB  Status:  Discontinued        1 g 200 mL/hr over 30 Minutes Intravenous Every 8 hours 05/28/20 0430 05/28/20 0449   05/28/20 0315  meropenem (MERREM) 1 g in sodium chloride 0.9 % 100 mL IVPB  Status:  Discontinued        1 g 200 mL/hr over  30 Minutes Intravenous Every 8 hours 05/28/20 0235 05/28/20 1541   05/28/20 0245  ceFEPIme (MAXIPIME) 2 g in sodium chloride 0.9 % 100 mL IVPB  Status:  Discontinued        2 g 200 mL/hr over 30 Minutes Intravenous  Once 05/28/20 0235 05/28/20 0236       Subjective: Complaining of nausea and abd pain  Objective: Vitals:   05/28/20 0645 05/28/20 0825 05/28/20 1141 05/28/20 1559  BP: (!) 182/95 (!) 161/94 (!) 163/86 (!) 191/108  Pulse:  75 73 78  Resp:   20 (!) 24  Temp:   97.7 F (36.5 C) 98.7 F (37.1 C)  TempSrc:    Oral  SpO2:   96% (!) 86%  Weight:      Height:        Intake/Output Summary (Last 24 hours) at 05/28/2020 1632 Last data filed at 05/28/2020 1500 Gross per 24 hour  Intake 3759.95 ml  Output 1300 ml    Net 2459.95 ml   Filed Weights   05/27/20 2222 05/28/20 0402  Weight: 136.1 kg (!) 142.7 kg    Examination:  General exam: Appears uncomfortable Respiratory system: Clear to auscultation. Respiratory effort normal. Cardiovascular system: S1 & S2 heard, Regular Gastrointestinal system: mildly distended, decreased BS Central nervous system: Alert and oriented. No focal neurological deficits. Extremities: Symmetric 5 x 5 power. Skin: No rashes, lesions Psychiatry: Judgement and insight appear normal. Mood & affect appropriate.   Data Reviewed: I have personally reviewed following labs and imaging studies  CBC: Recent Labs  Lab 05/27/20 2225 05/28/20 0446  WBC 12.0* 9.0  HGB 15.6 15.1  HCT 46.4 43.8  MCV 85.9 87.1  PLT 227 932   Basic Metabolic Panel: Recent Labs  Lab 05/27/20 2225 05/28/20 0446  NA 141 140  K 3.1* 3.0*  CL 98 101  CO2 27 28  GLUCOSE 138* 118*  BUN 18 15  CREATININE 0.86 0.79  CALCIUM 9.3 8.8*  MG  --  2.0   GFR: Estimated Creatinine Clearance: 132.6 mL/min (by C-G formula based on SCr of 0.79 mg/dL). Liver Function Tests: Recent Labs  Lab 05/27/20 2225  AST 28  ALT 20  ALKPHOS 126  BILITOT 1.0  PROT 8.0  ALBUMIN 4.2   Recent Labs  Lab 05/27/20 2225  LIPASE 44   No results for input(s): AMMONIA in the last 168 hours. Coagulation Profile: Recent Labs  Lab 05/28/20 0446  INR 1.0   Cardiac Enzymes: No results for input(s): CKTOTAL, CKMB, CKMBINDEX, TROPONINI in the last 168 hours. BNP (last 3 results) No results for input(s): PROBNP in the last 8760 hours. HbA1C: No results for input(s): HGBA1C in the last 72 hours. CBG: No results for input(s): GLUCAP in the last 168 hours. Lipid Profile: No results for input(s): CHOL, HDL, LDLCALC, TRIG, CHOLHDL, LDLDIRECT in the last 72 hours. Thyroid Function Tests: No results for input(s): TSH, T4TOTAL, FREET4, T3FREE, THYROIDAB in the last 72 hours. Anemia Panel: No results for  input(s): VITAMINB12, FOLATE, FERRITIN, TIBC, IRON, RETICCTPCT in the last 72 hours. Sepsis Labs: Recent Labs  Lab 05/28/20 0124 05/28/20 0248 05/28/20 0446  PROCALCITON  --   --  <0.10  LATICACIDVEN 3.1* 1.7  --     Recent Results (from the past 240 hour(s))  Resp Panel by RT-PCR (Flu A&B, Covid) Nasopharyngeal Swab     Status: None   Collection Time: 05/28/20  1:24 AM   Specimen: Nasopharyngeal Swab; Nasopharyngeal(NP) swabs in vial transport medium  Result Value Ref Range Status   SARS Coronavirus 2 by RT PCR NEGATIVE NEGATIVE Final    Comment: (NOTE) SARS-CoV-2 target nucleic acids are NOT DETECTED.  The SARS-CoV-2 RNA is generally detectable in upper respiratory specimens during the acute phase of infection. The lowest concentration of SARS-CoV-2 viral copies this assay can detect is 138 copies/mL. A negative result does not preclude SARS-Cov-2 infection and should not be used as the sole basis for treatment or other patient management decisions. A negative result may occur with  improper specimen collection/handling, submission of specimen other than nasopharyngeal swab, presence of viral mutation(s) within the areas targeted by this assay, and inadequate number of viral copies(<138 copies/mL). A negative result must be combined with clinical observations, patient history, and epidemiological information. The expected result is Negative.  Fact Sheet for Patients:  EntrepreneurPulse.com.au  Fact Sheet for Healthcare Providers:  IncredibleEmployment.be  This test is no t yet approved or cleared by the [place] FDA and  has been authorized for detection and/or diagnosis of SARS-CoV-2 by FDA under an Emergency Use Authorization (EUA). This EUA will remain  in effect (meaning this test can be used) for the duration of the COVID-19 declaration under Section 564(b)(1) of the Act, 21 U.S.C.section 360bbb-3(b)(1), unless the  authorization is terminated  or revoked sooner.       Influenza A by PCR NEGATIVE NEGATIVE Final   Influenza B by PCR NEGATIVE NEGATIVE Final    Comment: (NOTE) The Xpert Xpress SARS-CoV-2/FLU/RSV plus assay is intended as an aid in the diagnosis of influenza from Nasopharyngeal swab specimens and should not be used as a sole basis for treatment. Nasal washings and aspirates are unacceptable for Xpert Xpress SARS-CoV-2/FLU/RSV testing.  Fact Sheet for Patients: EntrepreneurPulse.com.au  Fact Sheet for Healthcare Providers: IncredibleEmployment.be  This test is not yet approved or cleared by the [place] FDA and has been authorized for detection and/or diagnosis of SARS-CoV-2 by FDA under an Emergency Use Authorization (EUA). This EUA will remain in effect (meaning this test can be used) for the duration of the COVID-19 declaration under Section 564(b)(1) of the Act, 21 U.S.C. section 360bbb-3(b)(1), unless the authorization is terminated or revoked.  Performed at [facility], [address] [place], [place] [address]   Culture, blood (single)     Status: None (Preliminary result)   Collection Time: 05/28/20  2:48 AM   Specimen: BLOOD  Result Value Ref Range Status   Specimen Description BLOOD  Final   Special Requests NONE  Final   Culture   Final    NO GROWTH < 12 HOURS Performed at [facility], [address] [place], [place] [address]    Report Status PENDING  Incomplete     Radiology Studies: DG Abd 1 View  Result Date: 05/28/2020 CLINICAL DATA:  Nasogastric tube placement. EXAM: ABDOMEN - 1 VIEW COMPARISON:  None. FINDINGS: The bowel gas pattern is normal. Nasogastric tube tip is seen in proximal stomach. No radio-opaque calculi or other significant radiographic abnormality are seen. IMPRESSION: Nasogastric tube tip seen in proximal stomach. Electronically Signed   By: Marijo Conception M.D.   On:  05/28/2020 13:41   DG Naso G Tube Plc W/Fl W/Rad  Result Date: 05/28/2020 INDICATION: Small-bowel obstruction EXAM: NASO G TUBE PLACEMENT WITH FL AND WITH RAD COMPARISON:  None. CONTRAST:  None FLUOROSCOPY TIME:  1 minutes. Fifty-four seconds. COMPLICATIONS: None immediate PROCEDURE: The nasogastric catheter was lubricated with viscous lidocaine inserted into the right nostril.  Under intermittent fluoroscopic guidance, attempts were made to pass the gastric catheter into the esophagus which were unsuccessful. Following several attempts the patient requested to terminate the procedure. IMPRESSION: Unsuccessful placement of nasogastric catheter as described. Electronically Signed   By: Inez Catalina M.D.   On: 05/28/2020 10:24   CT Angio Chest/Abd/Pel for Dissection W and/or W/WO  Result Date: 05/28/2020 CLINICAL DATA:  Abdominal pain.  Concern for aortic dissection. EXAM: CT ANGIOGRAPHY CHEST, ABDOMEN AND PELVIS TECHNIQUE: Non-contrast CT of the chest was initially obtained. Multidetector CT imaging through the chest, abdomen and pelvis was performed using the standard protocol during bolus administration of intravenous contrast. Multiplanar reconstructed images and MIPs were obtained and reviewed to evaluate the vascular anatomy. CONTRAST:  124mL OMNIPAQUE IOHEXOL 350 MG/ML SOLN COMPARISON:  CT dated June 02, 2017. FINDINGS: CTA CHEST FINDINGS Cardiovascular: The heart size is enlarged. There is no evidence for thoracic aortic dissection. No thoracic aortic aneurysm. There is no significant pericardial effusion. There is no large centrally located pulmonary embolism. Mediastinum/Nodes: -- No mediastinal lymphadenopathy. -- No hilar lymphadenopathy. -- No axillary lymphadenopathy. -- No supraclavicular lymphadenopathy. -- Normal thyroid gland where visualized. -  Unremarkable esophagus. Lungs/Pleura: Multiple small 4-5 mm pulmonary nodules are noted in the right upper lobe. There is atelectasis at the  lung bases. Musculoskeletal: No chest wall abnormality. No bony spinal canal stenosis. Review of the MIP images confirms the above findings. CTA ABDOMEN AND PELVIS FINDINGS VASCULAR Aorta: Normal caliber aorta without aneurysm, dissection, vasculitis or significant stenosis. Celiac: Patent without evidence of aneurysm, dissection, vasculitis or significant stenosis. SMA: Patent without evidence of aneurysm, dissection, vasculitis or significant stenosis. Renals: Both renal arteries are patent without evidence of aneurysm, dissection, vasculitis, fibromuscular dysplasia or significant stenosis. IMA: Patent without evidence of aneurysm, dissection, vasculitis or significant stenosis. Inflow: Patent without evidence of aneurysm, dissection, vasculitis or significant stenosis. Veins: No obvious venous abnormality within the limitations of this arterial phase study. Review of the MIP images confirms the above findings. NON-VASCULAR Hepatobiliary: The liver is normal. Status post cholecystectomy.There is no biliary ductal dilation. Pancreas: Normal contours without ductal dilatation. No peripancreatic fluid collection. Spleen: Unremarkable. Adrenals/Urinary Tract: --Adrenal glands: Unremarkable. --Right kidney/ureter: No hydronephrosis or radiopaque kidney stones. --Left kidney/ureter: No hydronephrosis or radiopaque kidney stones. --Urinary bladder: Unremarkable. Stomach/Bowel: --Stomach/Duodenum: The stomach is distended. --Small bowel: There is evidence for a moderate to high-grade small bowel obstruction with a transition point at the level of the mid anterior peritoneal cavity. This is likely secondary to enteroperitoneal adhesions. --Colon: Unremarkable. --Appendix: Surgically absent. Vascular/Lymphatic: Normal course and caliber of the major abdominal vessels. --No retroperitoneal lymphadenopathy. --No mesenteric lymphadenopathy. --No pelvic or inguinal lymphadenopathy. Reproductive: Unremarkable Other: No ascites  or free air. Again noted are coarse calcifications involving the anterior abdominal wall. Musculoskeletal. No acute displaced fractures. Review of the MIP images confirms the above findings. IMPRESSION: 1. No acute vascular abnormality. 2. There is evidence for a moderate to high-grade small bowel obstruction with a transition point at the level of the mid anterior peritoneal cavity. This is likely secondary to enteroperitoneal adhesions. 3. Cardiomegaly. 4. Multiple small 4-5 mm pulmonary nodules in the right upper lobe. No follow-up needed if patient is low-risk (and has no known or suspected primary neoplasm). Non-contrast chest CT can be considered in 12 months if patient is high-risk. This recommendation follows the consensus statement: Guidelines for Management of Incidental Pulmonary Nodules Detected on CT Images: From the Fleischner Society 2017; Radiology 2017; 284:228-243. Electronically Signed  By: Constance Holster M.D.   On: 05/28/2020 02:14    Scheduled Meds: . azelastine  2 spray Each Nare BID  . enalaprilat  0.625 mg Intravenous Q6H  . enoxaparin (LOVENOX) injection  70 mg Subcutaneous Q24H  . fluticasone  2 spray Each Nare Daily  . latanoprost  1 drop Both Eyes QHS  . lidocaine  15 mL Mouth/Throat Once  . metoprolol tartrate  5 mg Intravenous Q6H  . montelukast  10 mg Oral QHS  . pantoprazole (PROTONIX) IV  40 mg Intravenous Q12H   Continuous Infusions: . sodium chloride Stopped (05/28/20 0612)  . piperacillin-tazobactam (ZOSYN)  IV    . potassium chloride 10 mEq (05/28/20 1631)     LOS: 0 days   Marylu Lund, MD [facility] Pager On Amion  If 7PM-7AM, please contact night-coverage 05/28/2020, 4:32 PM

## 2020-05-28 NOTE — H&P (Signed)
[place]   PATIENT NAME: Peter [name]    MR#:  [id number]  DATE OF BIRTH:  23-Oct-1960  DATE OF ADMISSION:  05/28/2020  PRIMARY CARE PHYSICIAN: Jerrol Banana., MD   REQUESTING/REFERRING PHYSICIAN: Hinda Kehr, MD  CHIEF COMPLAINT:   Chief Complaint  Patient presents with  . Abdominal Pain    HISTORY OF PRESENT ILLNESS:  Peter [name]  is a 59 y.o. obese Caucasian male with a known history of hypertension, bipolar disorder, GERD, sleep apnea and peptic ulcer disease, status post multiple laparotomies including cholecystectomy, appendectomy and ventral herniorrhaphy, who presented to the emergency room with acute onset of recurrent nausea and vomiting with associated generalized abdominal pain felt as a burning and sharp pain mainly in the epigastric and periumbilical area.  No bilious vomitus or hematemesis.  No diarrhea or melena or bright red bleeding per rectum.  No chest pain or dyspnea or cough or wheezing.  Upon presentation to the emergency room, blood pressure was 149/111 with respiratory rate of 22 and later blood pressure was 183/100.  Labs revealed hypokalemia of 3 and CBC was unremarkable.  Lactic acid was 3.1 and later 1.7 and high-sensitivity troponin I was 21.  Influenza antigens and COVID-19 PCR came back negative.  Blood culture was drawn.  Chest and abdomen CTA revealed no acute vascular abnormalities.  It showed evidence for moderate to high-grade small bowel obstruction with transition point at the level of the mid anterior peritoneal cavity likely secondary to intraperitoneal adhesions.  It showed cardiomegaly and multiple small 4-5 mm M pulmonary nodules in the right upper lobe with recommendation for noncontrast chest CT follow-up in 12 months if the patient is high risk.  The patient was given 2 and half liters of IV lactated Ringer and 500 mill IV normal saline boluses, 4 mg of IV morphine sulfate and 4 mg of IV Zofran twice as well as IV  meropenem.  He will be admitted to a medical bed for further evaluation and management.  PAST MEDICAL HISTORY:   Past Medical History:  Diagnosis Date  . Bipolar disorder ([place])   . Blood in stool   . Difficult intubation   . Dyspnea   . GERD (gastroesophageal reflux disease)   . Glaucoma    since 2004  . Hypertension    since 2000  . Obesity, unspecified   . Sleep apnea   . Ulcer 2011   gastric  . Unspecified hemorrhoids without mention of complication     PAST SURGICAL HISTORY:   Past Surgical History:  Procedure Laterality Date  . APPLICATION OF WOUND VAC N/A 11/16/2016   Procedure: APPLICATION OF WOUND VAC-ABDOMINAL;  Surgeon: Florene Glen, MD;  Location: [facility];  Service: General;  Laterality: N/A;  . APPLICATION OF WOUND VAC N/A 11/18/2016   Procedure: APPLICATION OF WOUND VAC;  Surgeon: Florene Glen, MD;  Location: [facility];  Service: General;  Laterality: N/A;  change  . APPLICATION OF WOUND VAC N/A 11/23/2016   Procedure: WOUND VAC CHANGE;  Surgeon: Olean Ree, MD;  Location: [facility];  Service: General;  Laterality: N/A;  wound vac application  . CARDIAC CATHETERIZATION Left 06/23/2016   Procedure: Left Heart Cath and Coronary Angiography;  Surgeon: Nelva Bush, MD;  Location: [place] [facility];  Service: Cardiovascular;  Laterality: Left;  . CHOLECYSTECTOMY  1992  . COLONOSCOPY W/ POLYPECTOMY  05/22/2010   3mtransverse colon polyp, traditional serrated adenoma,negative for high grade dysplasia & malignancy.  rectal polyp-56m,negative for dysplasia & malignancy.  . COLONOSCOPY WITH PROPOFOL N/A 06/20/2019   Procedure: COLONOSCOPY WITH PROPOFOL;  Surgeon: VLin Landsman MD;  [facility];  Service: Gastroenterology;  Laterality: N/A;  . COLONOSCOPY WITH PROPOFOL N/A 06/21/2019   Procedure: COLONOSCOPY WITH PROPOFOL;  Surgeon: VLin Landsman MD;  [facility];  Service: Gastroenterology;  Laterality: N/A;  .  DRESSING CHANGE UNDER ANESTHESIA N/A 11/21/2016   Procedure: DRESSING CHANGE UNDER ANESTHESIA;  Surgeon: CFlorene Glen MD;  Location: [facility];  Service: General;  Laterality: N/A;  . ESOPHAGOGASTRODUODENOSCOPY (EGD) WITH PROPOFOL N/A 06/20/2019   Procedure: ESOPHAGOGASTRODUODENOSCOPY (EGD) WITH PROPOFOL;  Surgeon: VLin Landsman MD;  Location: [facility];  Service: Gastroenterology;  Laterality: N/A;  . KNEE SURGERY Left 2010  . LAPAROSCOPIC APPENDECTOMY N/A 10/30/2016   Procedure: APPENDECTOMY LAPAROSCOPIC changed  to open application of wound vac;  Surgeon: DJules Husbands MD;  Location: [facility];  Service: General;  Laterality: N/A;  . LAPAROTOMY N/A 11/11/2016   Procedure: EXPLORATORY LAPAROTOMY, DEBRIDEMENT OF ABDOMINAL WOUND, [place]INAL [place]  Surgeon: PJules Husbands MD;  Location: [facility];  Service: General;  Laterality: N/A;  . LAPAROTOMY N/A 11/12/2016   Procedure: EXPLORATORY LAPAROTOMY;  Surgeon: WClayburn Pert MD;  Location: [facility];  Service: General;  Laterality: N/A;  . LAPAROTOMY N/A 11/14/2016   Procedure: EXPLORATORY LAPAROTOMY; Irrigation, partial closure;  Surgeon: WClayburn Pert MD;  Location: [facility];  Service: General;  Laterality: N/A;  . TOTAL KNEE ARTHROPLASTY Left 03/21/2019   Procedure: TOTAL KNEE ARTHROPLASTY;  Surgeon: BLovell Sheehan MD;  Location: [facility];  Service: Orthopedics;  Laterality: Left;    SOCIAL HISTORY:   Social History   Tobacco Use  . Smoking status: Never Smoker  . Smokeless tobacco: Never Used  Substance Use Topics  . Alcohol use: Not Currently    Comment: occasionally    FAMILY HISTORY:   Family History  Problem Relation Age of Onset  . Cancer Other        colon  . Hyperlipidemia Mother   . Hypertension Mother     DRUG ALLERGIES:   Allergies  Allergen Reactions  . Oxycodone Other (See Comments)    Causes Hallucinations, auditory and tactile  . Isosorbide Nausea And Vomiting    Headache, nausea,  vomiting    REVIEW OF SYSTEMS:   ROS As per history of present illness. All pertinent systems were reviewed above. Constitutional, HEENT, cardiovascular, respiratory, GI, GU, musculoskeletal, neuro, psychiatric, endocrine, integumentary and hematologic systems were reviewed and are otherwise negative/unremarkable except for positive findings mentioned above in the HPI.   MEDICATIONS AT HOME:   Prior to Admission medications   Medication Sig Start Date End Date Taking? Authorizing Provider  ALPRAZolam (Duanne Moron 0.5 MG tablet TAKE 1 TABLET(0.5 MG) BY MOUTH TWICE DAILY AS NEEDED FOR ANXIETY 05/12/20   HAdelene Idler THelene KelpT, PA-C  amLODipine (NORVASC) 10 MG tablet TAKE 1 TABLET(10 MG) BY MOUTH DAILY 05/09/20   GJerrol Banana, MD  aspirin EC 81 MG tablet Take 81 mg by mouth daily.    [provider]  atorvastatin (LIPITOR) 40 MG tablet TAKE 1 TABLET DAILY 12/11/19   GJerrol Banana, MD  azelastine (ASTELIN) 0.1 % nasal spray SMARTSIG:1-2 Spray(s) Both Nares Twice Daily 07/19/19   [provider]  b complex vitamins capsule Take 1 capsule by mouth daily. 04/24/19   HDonnal MoatT, PA-C  carbamazepine (TEGRETOL) 200 MG tablet TAKE 3 TABLETS BY MOUTH  EVERY MORNING AND 2 TABLETS BY MOUTH EVERY NIGHT AT BEDTIME 03/25/20   Hurst, Dorothea Glassman, PA-C  Cholecalciferol (NAT-RUL VITAMIN D) 25 MCG (1000 UT) tablet Take 1,000 Units by mouth daily.    [provider]  eplerenone (INSPRA) 50 MG tablet Take 50 mg by mouth daily.    [provider]  fluticasone Asencion Islam) 50 MCG/ACT nasal spray SMARTSIG:1-2 Spray(s) Both Nares Daily 07/19/19   [provider]  hydrALAZINE (APRESOLINE) 100 MG tablet TAKE 1 TABLET THREE TIMES A DAY 03/10/20   Jerrol Banana., MD  ibuprofen (ADVIL) 800 MG tablet  07/17/19   [provider]  lamoTRIgine (LAMICTAL) 200 MG tablet TAKE 1 AND 1/2 TABLETS(300 MG) BY MOUTH AT BEDTIME 05/12/20   Hurst, Teresa T, PA-C  latanoprost  (XALATAN) 0.005 % ophthalmic solution Apply to eye.    [provider]  losartan (COZAAR) 100 MG tablet TAKE 1 TABLET DAILY 03/10/20   Jerrol Banana., MD  metoprolol tartrate (LOPRESSOR) 50 MG tablet TAKE 1 TABLET TWICE A DAY 03/10/20   Jerrol Banana., MD  montelukast (SINGULAIR) 10 MG tablet Take 10 mg by mouth at bedtime.    [provider]  NARCAN 4 MG/0.1ML LIQD nasal spray kit USE 1 SPRAY IEN UTD PRN 04/05/19   [provider]  NON FORMULARY CPAP    [provider]  Omega-3 Fatty Acids (FISH OIL) 1000 MG CAPS Take by mouth.    [provider]  omeprazole (PRILOSEC) 40 MG capsule Take 1 capsule (40 mg total) by mouth daily before breakfast. 12/04/19   Lin Landsman, MD  sertraline (ZOLOFT) 100 MG tablet TAKE 1 TABLET DAILY 01/15/20   Jerrol Banana., MD  tamsulosin (FLOMAX) 0.4 MG CAPS capsule Take 1 capsule (0.4 mg total) by mouth daily. 03/17/20   Jerrol Banana., MD  UNABLE TO FIND 900 mg by Per post-pyloric tube route. Med Name: Kyolic Patient not taking: Reported on 12/19/2019    [provider]  vitamin B-12 (CYANOCOBALAMIN) 1000 MCG tablet  07/17/19   [provider]      VITAL SIGNS:  Blood pressure (!) 149/111, pulse 79, resp. rate (!) 22, height _0  (1.702 m), weight 136.1 kg, SpO2 98 %.  PHYSICAL EXAMINATION:  Physical Exam  GENERAL:  59 y.o.-year-old obese Caucasian male patient lying in the bed with no acute distress.  EYES: Pupils equal, round, reactive to light and accommodation. No scleral icterus. Extraocular muscles intact.  HEENT: Head atraumatic, normocephalic. Oropharynx and nasopharynx clear.  NECK:  Supple, no jugular venous distention. No thyroid enlargement, no tenderness.  LUNGS: Normal breath sounds bilaterally, no wheezing, rales,rhonchi or crepitation. No use of accessory muscles of respiration.  CARDIOVASCULAR: Regular rate and rhythm, S1, S2 normal. No murmurs, rubs,  or gallops.  ABDOMEN: Soft, mildly distended with generalized tenderness mainly in the epigastric and periumbilical area without rebound tenderness guarding or rigidity.  Bowel sounds were diminished.  No organomegaly or mass.  EXTREMITIES: No pedal edema, cyanosis, or clubbing.  NEUROLOGIC: Cranial nerves II through XII are intact. Muscle strength 5/5 in all extremities. Sensation intact. Gait not checked.  PSYCHIATRIC: The patient is alert and oriented x 3.  Normal affect and good eye contact. SKIN: No obvious rash, lesion, or ulcer.   LABORATORY PANEL:   CBC Recent Labs  Lab 05/27/20 2225  WBC 12.0*  HGB 15.6  HCT 46.4  PLT 227   ------------------------------------------------------------------------------------------------------------------  Chemistries  Recent  Labs  Lab 05/27/20 2225  NA 141  K 3.1*  CL 98  CO2 27  GLUCOSE 138*  BUN 18  CREATININE 0.86  CALCIUM 9.3  AST 28  ALT 20  ALKPHOS 126  BILITOT 1.0   ------------------------------------------------------------------------------------------------------------------  Cardiac Enzymes No results for input(s): TROPONINI in the last 168 hours. ------------------------------------------------------------------------------------------------------------------  RADIOLOGY:  CT Angio Chest/Abd/Pel for Dissection W and/or W/WO  Result Date: 05/28/2020 CLINICAL DATA:  Abdominal pain.  Concern for aortic dissection. EXAM: CT ANGIOGRAPHY CHEST, ABDOMEN AND PELVIS TECHNIQUE: Non-contrast CT of the chest was initially obtained. Multidetector CT imaging through the chest, abdomen and pelvis was performed using the standard protocol during bolus administration of intravenous contrast. Multiplanar reconstructed images and MIPs were obtained and reviewed to evaluate the vascular anatomy. CONTRAST:  157m OMNIPAQUE IOHEXOL 350 MG/ML SOLN COMPARISON:  CT dated June 02, 2017. FINDINGS: CTA CHEST FINDINGS Cardiovascular: The  heart size is enlarged. There is no evidence for thoracic aortic dissection. No thoracic aortic aneurysm. There is no significant pericardial effusion. There is no large centrally located pulmonary embolism. Mediastinum/Nodes: -- No mediastinal lymphadenopathy. -- No hilar lymphadenopathy. -- No axillary lymphadenopathy. -- No supraclavicular lymphadenopathy. -- Normal thyroid gland where visualized. -  Unremarkable esophagus. Lungs/Pleura: Multiple small 4-5 mm pulmonary nodules are noted in the right upper lobe. There is atelectasis at the lung bases. Musculoskeletal: No chest wall abnormality. No bony spinal canal stenosis. Review of the MIP images confirms the above findings. CTA ABDOMEN AND PELVIS FINDINGS VASCULAR Aorta: Normal caliber aorta without aneurysm, dissection, vasculitis or significant stenosis. Celiac: Patent without evidence of aneurysm, dissection, vasculitis or significant stenosis. SMA: Patent without evidence of aneurysm, dissection, vasculitis or significant stenosis. Renals: Both renal arteries are patent without evidence of aneurysm, dissection, vasculitis, fibromuscular dysplasia or significant stenosis. IMA: Patent without evidence of aneurysm, dissection, vasculitis or significant stenosis. Inflow: Patent without evidence of aneurysm, dissection, vasculitis or significant stenosis. Veins: No obvious venous abnormality within the limitations of this arterial phase study. Review of the MIP images confirms the above findings. NON-VASCULAR Hepatobiliary: The liver is normal. Status post cholecystectomy.There is no biliary ductal dilation. Pancreas: Normal contours without ductal dilatation. No peripancreatic fluid collection. Spleen: Unremarkable. Adrenals/Urinary Tract: --Adrenal glands: Unremarkable. --Right kidney/ureter: No hydronephrosis or radiopaque kidney stones. --Left kidney/ureter: No hydronephrosis or radiopaque kidney stones. --Urinary bladder: Unremarkable. Stomach/Bowel:  --Stomach/Duodenum: The stomach is distended. --Small bowel: There is evidence for a moderate to high-grade small bowel obstruction with a transition point at the level of the mid anterior peritoneal cavity. This is likely secondary to enteroperitoneal adhesions. --Colon: Unremarkable. --Appendix: Surgically absent. Vascular/Lymphatic: Normal course and caliber of the major abdominal vessels. --No retroperitoneal lymphadenopathy. --No mesenteric lymphadenopathy. --No pelvic or inguinal lymphadenopathy. Reproductive: Unremarkable Other: No ascites or free air. Again noted are coarse calcifications involving the anterior abdominal wall. Musculoskeletal. No acute displaced fractures. Review of the MIP images confirms the above findings. IMPRESSION: 1. No acute vascular abnormality. 2. There is evidence for a moderate to high-grade small bowel obstruction with a transition point at the level of the mid anterior peritoneal cavity. This is likely secondary to enteroperitoneal adhesions. 3. Cardiomegaly. 4. Multiple small 4-5 mm pulmonary nodules in the right upper lobe. No follow-up needed if patient is low-risk (and has no known or suspected primary neoplasm). Non-contrast chest CT can be considered in 12 months if patient is high-risk. This recommendation follows the consensus statement: Guidelines for Management of Incidental Pulmonary Nodules Detected on CT Images: From the  Fleischner Society 2017; Radiology 2017; [id number]. Electronically Signed   By: Constance Holster M.D.   On: 05/28/2020 02:14      IMPRESSION AND PLAN:   1.  Small bowel obstruction like secondary to adhesions from previous multiple laparotomies, with elevated lactic acid. -The patient will be admitted to a medical bed. -He will be kept n.p.o. -He will be hydrated with IV normal saline. -NG tube placement will be attempted in place to low intermittent suction. -We will continue IV meropenem given elevated lactic acid level. -We will  follow to review abdomen x-ray. -General surgery consultation will be obtained. -Dr. Dahlia Byes was notified about the patient and is aware.  2.  Hypokalemia. -Potassium will be replaced and magnesium level will be checked. -This could be contributing to an element of ileus.  3.  Hypertensive urgency. -We will continue Lopressor, Cozaar, hydralazine and place the patient on as needed IV labetalol.  4.  Dyslipidemia. -We will continue statin therapy and Lovaza.  5.  Bipolar disorder. -We will continue Tegretol and Lamictal.  6.  BPH. -Continue Flomax.  7.  Peptic ulcer disease. -We will place the patient on IV PPI therapy.  8.  DVT prophylaxis. -Subcutaneous Lovenox.    All the records are reviewed and case discussed with ED provider. The plan of care was discussed in details with the patient (and family). I answered all questions. The patient agreed to proceed with the above mentioned plan. Further management will depend upon hospital course.   CODE STATUS: Full code  Status is: Inpatient  Remains inpatient appropriate because:Ongoing active pain requiring inpatient pain management, Ongoing diagnostic testing needed not appropriate for outpatient work up, Unsafe d/c plan, IV treatments appropriate due to intensity of illness or inability to take PO and Inpatient level of care appropriate due to severity of illness   Dispo: The patient is from: Home              Anticipated d/c is to: Home              Anticipated d/c date is: 3 days              Patient currently is not medically stable to d/c.   TOTAL TIME TAKING CARE OF THIS PATIENT: 55 minutes.    Christel Mormon M.D on 05/28/2020 at 2:48 AM  [facility]   From 7 PM-7 AM, contact night-coverage [email or web address]  CC: Primary care physician; Jerrol Banana., MD

## 2020-05-28 NOTE — Progress Notes (Signed)
Pt has orders for NG tube placement. It was not placed in the ED. Both myself & two other nurses attempted to place NG tube, and unsuccessful. Notified NP.

## 2020-05-28 NOTE — ED Notes (Signed)
Report called to  Zachery Conch rn all questions answered

## 2020-05-29 ENCOUNTER — Inpatient Hospital Stay: Payer: Managed Care, Other (non HMO)

## 2020-05-29 DIAGNOSIS — K56601 Complete intestinal obstruction, unspecified as to cause: Secondary | ICD-10-CM

## 2020-05-29 DIAGNOSIS — R778 Other specified abnormalities of plasma proteins: Secondary | ICD-10-CM

## 2020-05-29 LAB — CBC
HCT: 43.6 % (ref 39.0–52.0)
Hemoglobin: 14.3 g/dL (ref 13.0–17.0)
MCH: 29.1 pg (ref 26.0–34.0)
MCHC: 32.8 g/dL (ref 30.0–36.0)
MCV: 88.6 fL (ref 80.0–100.0)
Platelets: 197 10*3/uL (ref 150–400)
RBC: 4.92 MIL/uL (ref 4.22–5.81)
RDW: 13.1 % (ref 11.5–15.5)
WBC: 9 10*3/uL (ref 4.0–10.5)
nRBC: 0 % (ref 0.0–0.2)

## 2020-05-29 LAB — COMPREHENSIVE METABOLIC PANEL
ALT: 16 U/L (ref 0–44)
AST: 21 U/L (ref 15–41)
Albumin: 3.2 g/dL — ABNORMAL LOW (ref 3.5–5.0)
Alkaline Phosphatase: 98 U/L (ref 38–126)
Anion gap: 9 (ref 5–15)
BUN: 23 mg/dL (ref 8–23)
CO2: 29 mmol/L (ref 22–32)
Calcium: 8.4 mg/dL — ABNORMAL LOW (ref 8.9–10.3)
Chloride: 103 mmol/L (ref 98–111)
Creatinine, Ser: 0.78 mg/dL (ref 0.61–1.24)
GFR, Estimated: >60 mL/min (ref >60)
Glucose, Bld: 108 mg/dL — ABNORMAL HIGH (ref 70–99)
Potassium: 2.8 mmol/L — ABNORMAL LOW (ref 3.5–5.1)
Sodium: 141 mmol/L (ref 135–145)
Total Bilirubin: 0.8 mg/dL (ref 0.3–1.2)
Total Protein: 6.7 g/dL (ref 6.5–8.1)

## 2020-05-29 LAB — MAGNESIUM: Magnesium: 2.2 mg/dL (ref 1.7–2.4)

## 2020-05-29 MED ORDER — METOPROLOL TARTRATE 5 MG/5ML IV SOLN
2.5000 mg | Freq: Four times a day (QID) | INTRAVENOUS | Status: DC
  Administered 2020-05-29 – 2020-05-31 (×7): 2.5 mg via INTRAVENOUS
  Filled 2020-05-29 (×7): qty 5

## 2020-05-29 MED ORDER — POTASSIUM CHLORIDE 10 MEQ/100ML IV SOLN
10.0000 meq | INTRAVENOUS | Status: AC
  Administered 2020-05-29 (×6): 10 meq via INTRAVENOUS
  Filled 2020-05-29 (×2): qty 100

## 2020-05-29 NOTE — Plan of Care (Signed)
Pt calm and cooperative and able to voice his needs. Prn pain med adm for abdominal pain, effective. Prn Zofran adm, effective. NGT to LIWS in place. Slightly elevated Bps. On IV Metoprolol and Vasotec. On IVF and IV Abx. Currently on 5L [place], wiill start weaning off. No signs of distress noted. Safety measures in place. Will continue to monitor. Problem: Education: Goal: Knowledge of General Education information will improve Description: Including pain rating scale, medication(s)/side effects and non-pharmacologic comfort measures Outcome: Progressing   Problem: Health Behavior/Discharge Planning: Goal: Ability to manage health-related needs will improve Outcome: Progressing   Problem: Clinical Measurements: Goal: Ability to maintain clinical measurements within normal limits will improve Outcome: Progressing Goal: Will remain free from infection Outcome: Progressing Goal: Diagnostic test results will improve Outcome: Progressing Goal: Respiratory complications will improve Outcome: Progressing Goal: Cardiovascular complication will be avoided Outcome: Progressing   Problem: Activity: Goal: Risk for activity intolerance will decrease Outcome: Progressing   Problem: Nutrition: Goal: Adequate nutrition will be maintained Outcome: Progressing   Problem: Coping: Goal: Level of anxiety will decrease Outcome: Progressing   Problem: Elimination: Goal: Will not experience complications related to bowel motility Outcome: Progressing Goal: Will not experience complications related to urinary retention Outcome: Progressing   Problem: Pain Managment: Goal: General experience of comfort will improve Outcome: Progressing   Problem: Safety: Goal: Ability to remain free from injury will improve Outcome: Progressing   Problem: Skin Integrity: Goal: Risk for impaired skin integrity will decrease Outcome: Progressing

## 2020-05-29 NOTE — Progress Notes (Signed)
PROGRESS NOTE    Peter [name]  JXB:[id number] DOB: 21-Feb-1961 DOA: 05/28/2020 PCP: Jerrol Banana., MD    Brief Narrative:  59 y.o. obese Caucasian male with a known history of hypertension, bipolar disorder, GERD, sleep apnea and peptic ulcer disease, status post multiple laparotomies including cholecystectomy, appendectomy and ventral herniorrhaphy, who presented to the emergency room with acute onset of recurrent nausea and vomiting with associated generalized abdominal pain, found to have SBO. Pt was admitted with General Surgery following  Assessment & Plan:   Active Problems:   SBO (small bowel obstruction) ([place])   1.  Small bowel obstruction like secondary to adhesions from previous multiple laparotomies, with elevated lactic acid. -abd imaging personally reviewed. Findings worrisome for mod to high grade SBO with transition point at the level of the mid-anterior peritoneal cavity, likely secondary to enteroperitoneal adhesions -Pt is kept NPO with NG for decompression -Cont to follow and correct electrolytes, goal K >4 and Mg>2 -Continue with conservative management for now per General Surgery recs  2.  Hypokalemia. -Remains low this AM -Will give IV KCl x 6 runs -Repeat bmet in AM  3.  Hypertensive urgency. -Given SBO, cont to hold off on oral home bp meds -Will continue on scheduled IV ACEI and IV beta blocker, titrate as tolerated -Continue on PRN hydralazine -Would resume home bp meds when pt is able to reliably tolerate PO meds  4.  Dyslipidemia. -On statin therapy and Lovaza prior to admit -Will hold oral meds while pt is NPO  5.  Bipolar disorder. -Hold Tegretol and Lamictal while pt is NPO, would resume when able to reliably tolerate PO  6.  BPH. -Continue to hold Flomax for now  7.  Peptic ulcer disease. -We will place the patient on IV PPI therapy.  8.  DVT prophylaxis. -Subcutaneous Lovenox.  9. OSA -Will continue CPAP as  tolerated  10. Acute hypoxemic respiratory failure -Pt is baseline O2 naive, currently requiring 5LNC -CXR ordered and reviewed, no acute process noted -Given recent n/v, suspecting aspiration PNA vs pneumonitis -continue on zosyn  DVT prophylaxis: Lovenox subq Code Status: Full Family Communication: Pt in room, family is at bedside  Status is: Inpatient  Remains inpatient appropriate because:IV treatments appropriate due to intensity of illness or inability to take PO and Inpatient level of care appropriate due to severity of illness   Dispo: The patient is from: Home              Anticipated d/c is to: Home              Anticipated d/c date is: > 3 days              Patient currently is not medically stable to d/c.    Consultants:   General Surgery  IR  Procedures:     Antimicrobials: Anti-infectives (From admission, onward)   Start     Dose/Rate Route Frequency Ordered Stop   05/28/20 1700  piperacillin-tazobactam (ZOSYN) IVPB 3.375 g        3.375 g 12.5 mL/hr over 240 Minutes Intravenous Every 8 hours 05/28/20 1541     05/28/20 0600  ceFEPIme (MAXIPIME) 2 g in sodium chloride 0.9 % 100 mL IVPB  Status:  Discontinued        2 g 200 mL/hr over 30 Minutes Intravenous Every 8 hours 05/28/20 0430 05/28/20 0718   05/28/20 0600  meropenem (MERREM) 1 g in sodium chloride 0.9 % 100 mL IVPB  Status:  Discontinued        1 g 200 mL/hr over 30 Minutes Intravenous Every 8 hours 05/28/20 0430 05/28/20 0449   05/28/20 0315  meropenem (MERREM) 1 g in sodium chloride 0.9 % 100 mL IVPB  Status:  Discontinued        1 g 200 mL/hr over 30 Minutes Intravenous Every 8 hours 05/28/20 0235 05/28/20 1541   05/28/20 0245  ceFEPIme (MAXIPIME) 2 g in sodium chloride 0.9 % 100 mL IVPB  Status:  Discontinued        2 g 200 mL/hr over 30 Minutes Intravenous  Once 05/28/20 0235 05/28/20 0236      Subjective: Without complaints this AM. Does report passing flatus this  AM  Objective: Vitals:   05/28/20 2324 05/29/20 0410 05/29/20 0800 05/29/20 1159  BP: (!) 166/96 (!) 154/84 (!) 154/76 (!) 150/86  Pulse: 77 62 (!) 57 (!) 57  Resp: 20 18 18    Temp: 98.3 F (36.8 C) 98.5 F (36.9 C) 98.4 F (36.9 C) 98.3 F (36.8 C)  TempSrc: Oral Oral Oral   SpO2: 94% 95% 97% 100%  Weight:      Height:        Intake/Output Summary (Last 24 hours) at 05/29/2020 1403 Last data filed at 05/29/2020 0600 Gross per 24 hour  Intake --  Output 1990 ml  Net -1990 ml   Filed Weights   05/27/20 2222 05/28/20 0402  Weight: 136.1 kg (!) 142.7 kg    Examination: General exam: Awake, laying in bed, in nad, NG in place Respiratory system: Normal respiratory effort, no wheezing Cardiovascular system: regular rate, s1, s2 Gastrointestinal system: Soft, nondistended, positive BS Central nervous system: CN2-12 grossly intact, strength intact Extremities: Perfused, no clubbing Skin: Normal skin turgor, no notable skin lesions seen Psychiatry: Mood normal // no visual hallucinations   Data Reviewed: I have personally reviewed following labs and imaging studies  CBC: Recent Labs  Lab 05/27/20 2225 05/28/20 0446 05/29/20 0451  WBC 12.0* 9.0 9.0  HGB 15.6 15.1 14.3  HCT 46.4 43.8 43.6  MCV 85.9 87.1 88.6  PLT 227 213 166   Basic Metabolic Panel: Recent Labs  Lab 05/27/20 2225 05/28/20 0446 05/29/20 0451  NA 141 140 141  K 3.1* 3.0* 2.8*  CL 98 101 103  CO2 27 28 29   GLUCOSE 138* 118* 108*  BUN 18 15 23   CREATININE 0.86 0.79 0.78  CALCIUM 9.3 8.8* 8.4*  MG  --  2.0 2.2   GFR: Estimated Creatinine Clearance: 132.6 mL/min (by C-G formula based on SCr of 0.78 mg/dL). Liver Function Tests: Recent Labs  Lab 05/27/20 2225 05/29/20 0451  AST 28 21  ALT 20 16  ALKPHOS 126 98  BILITOT 1.0 0.8  PROT 8.0 6.7  ALBUMIN 4.2 3.2*   Recent Labs  Lab 05/27/20 2225  LIPASE 44   No results for input(s): AMMONIA in the last 168 hours. Coagulation  Profile: Recent Labs  Lab 05/28/20 0446  INR 1.0   Cardiac Enzymes: No results for input(s): CKTOTAL, CKMB, CKMBINDEX, TROPONINI in the last 168 hours. BNP (last 3 results) No results for input(s): PROBNP in the last 8760 hours. HbA1C: No results for input(s): HGBA1C in the last 72 hours. CBG: No results for input(s): GLUCAP in the last 168 hours. Lipid Profile: No results for input(s): CHOL, HDL, LDLCALC, TRIG, CHOLHDL, LDLDIRECT in the last 72 hours. Thyroid Function Tests: No results for input(s): TSH, T4TOTAL, FREET4, T3FREE, THYROIDAB in the  last 72 hours. Anemia Panel: No results for input(s): VITAMINB12, FOLATE, FERRITIN, TIBC, IRON, RETICCTPCT in the last 72 hours. Sepsis Labs: Recent Labs  Lab 05/28/20 0124 05/28/20 0248 05/28/20 0446  PROCALCITON  --   --  <0.10  LATICACIDVEN 3.1* 1.7  --     Recent Results (from the past 240 hour(s))  Resp Panel by RT-PCR (Flu A&B, Covid) Nasopharyngeal Swab     Status: None   Collection Time: 05/28/20  1:24 AM   Specimen: Nasopharyngeal Swab; Nasopharyngeal(NP) swabs in vial transport medium  Result Value Ref Range Status   SARS Coronavirus 2 by RT PCR NEGATIVE NEGATIVE Final    Comment: (NOTE) SARS-CoV-2 target nucleic acids are NOT DETECTED.  The SARS-CoV-2 RNA is generally detectable in upper respiratory specimens during the acute phase of infection. The lowest concentration of SARS-CoV-2 viral copies this assay can detect is 138 copies/mL. A negative result does not preclude SARS-Cov-2 infection and should not be used as the sole basis for treatment or other patient management decisions. A negative result may occur with  improper specimen collection/handling, submission of specimen other than nasopharyngeal swab, presence of viral mutation(s) within the areas targeted by this assay, and inadequate number of viral copies(<138 copies/mL). A negative result must be combined with clinical observations, patient history, and  epidemiological information. The expected result is Negative.  Fact Sheet for Patients:  EntrepreneurPulse.com.au  Fact Sheet for Healthcare Providers:  IncredibleEmployment.be  This test is no t yet approved or cleared by the [place] FDA and  has been authorized for detection and/or diagnosis of SARS-CoV-2 by FDA under an Emergency Use Authorization (EUA). This EUA will remain  in effect (meaning this test can be used) for the duration of the COVID-19 declaration under Section 564(b)(1) of the Act, 21 U.S.C.section 360bbb-3(b)(1), unless the authorization is terminated  or revoked sooner.       Influenza A by PCR NEGATIVE NEGATIVE Final   Influenza B by PCR NEGATIVE NEGATIVE Final    Comment: (NOTE) The Xpert Xpress SARS-CoV-2/FLU/RSV plus assay is intended as an aid in the diagnosis of influenza from Nasopharyngeal swab specimens and should not be used as a sole basis for treatment. Nasal washings and aspirates are unacceptable for Xpert Xpress SARS-CoV-2/FLU/RSV testing.  Fact Sheet for Patients: EntrepreneurPulse.com.au  Fact Sheet for Healthcare Providers: IncredibleEmployment.be  This test is not yet approved or cleared by the [place] FDA and has been authorized for detection and/or diagnosis of SARS-CoV-2 by FDA under an Emergency Use Authorization (EUA). This EUA will remain in effect (meaning this test can be used) for the duration of the COVID-19 declaration under Section 564(b)(1) of the Act, 21 U.S.C. section 360bbb-3(b)(1), unless the authorization is terminated or revoked.  Performed at [facility], [address] [place], [place] [address]   Culture, blood (single)     Status: None (Preliminary result)   Collection Time: 05/28/20  2:48 AM   Specimen: BLOOD  Result Value Ref Range Status   Specimen Description BLOOD  Final   Special Requests NONE  Final    Culture   Final    NO GROWTH 1 DAY Performed at [facility], [address] [place], [place] [address]    Report Status PENDING  Incomplete     Radiology Studies: DG Abd 1 View  Result Date: 05/28/2020 CLINICAL DATA:  Nasogastric tube placement. EXAM: ABDOMEN - 1 VIEW COMPARISON:  None. FINDINGS: The bowel gas pattern is normal. Nasogastric tube tip is  seen in proximal stomach. No radio-opaque calculi or other significant radiographic abnormality are seen. IMPRESSION: Nasogastric tube tip seen in proximal stomach. Electronically Signed   By: Marijo Conception M.D.   On: 05/28/2020 13:41   DG Chest Port 1 View  Result Date: 05/29/2020 CLINICAL DATA:  Possible aspiration EXAM: PORTABLE CHEST 1 VIEW COMPARISON:  Nov 28, 2016 FINDINGS: The cardiomediastinal silhouette is unchanged and enlarged in contour.Unchanged elevation of the RIGHT hemidiaphragm. Low lung volumes. The enteric tube courses through the chest to the abdomen beyond the field-of-view. No pleural effusion. No pneumothorax. Scattered linear opacities are most consistent with atelectasis. Bronchovascular crowding. Visualized abdomen is unremarkable. Multilevel degenerative changes of the thoracic spine. IMPRESSION: 1. Low lung volumes with scattered atelectasis and bronchovascular crowding. No definite consolidation. Electronically Signed   By: Valentino Saxon MD   On: 05/29/2020 11:07   DG Addison Bailey G Tube Plc W/Fl W/Rad  Result Date: 05/28/2020 INDICATION: Small-bowel obstruction EXAM: NASO G TUBE PLACEMENT WITH FL AND WITH RAD COMPARISON:  None. CONTRAST:  None FLUOROSCOPY TIME:  1 minutes. Fifty-four seconds. COMPLICATIONS: None immediate PROCEDURE: The nasogastric catheter was lubricated with viscous lidocaine inserted into the right nostril. Under intermittent fluoroscopic guidance, attempts were made to pass the gastric catheter into the esophagus which were unsuccessful. Following several attempts the patient requested  to terminate the procedure. IMPRESSION: Unsuccessful placement of nasogastric catheter as described. Electronically Signed   By: Inez Catalina M.D.   On: 05/28/2020 10:24   CT Angio Chest/Abd/Pel for Dissection W and/or W/WO  Result Date: 05/28/2020 CLINICAL DATA:  Abdominal pain.  Concern for aortic dissection. EXAM: CT ANGIOGRAPHY CHEST, ABDOMEN AND PELVIS TECHNIQUE: Non-contrast CT of the chest was initially obtained. Multidetector CT imaging through the chest, abdomen and pelvis was performed using the standard protocol during bolus administration of intravenous contrast. Multiplanar reconstructed images and MIPs were obtained and reviewed to evaluate the vascular anatomy. CONTRAST:  159mL OMNIPAQUE IOHEXOL 350 MG/ML SOLN COMPARISON:  CT dated June 02, 2017. FINDINGS: CTA CHEST FINDINGS Cardiovascular: The heart size is enlarged. There is no evidence for thoracic aortic dissection. No thoracic aortic aneurysm. There is no significant pericardial effusion. There is no large centrally located pulmonary embolism. Mediastinum/Nodes: -- No mediastinal lymphadenopathy. -- No hilar lymphadenopathy. -- No axillary lymphadenopathy. -- No supraclavicular lymphadenopathy. -- Normal thyroid gland where visualized. -  Unremarkable esophagus. Lungs/Pleura: Multiple small 4-5 mm pulmonary nodules are noted in the right upper lobe. There is atelectasis at the lung bases. Musculoskeletal: No chest wall abnormality. No bony spinal canal stenosis. Review of the MIP images confirms the above findings. CTA ABDOMEN AND PELVIS FINDINGS VASCULAR Aorta: Normal caliber aorta without aneurysm, dissection, vasculitis or significant stenosis. Celiac: Patent without evidence of aneurysm, dissection, vasculitis or significant stenosis. SMA: Patent without evidence of aneurysm, dissection, vasculitis or significant stenosis. Renals: Both renal arteries are patent without evidence of aneurysm, dissection, vasculitis, fibromuscular  dysplasia or significant stenosis. IMA: Patent without evidence of aneurysm, dissection, vasculitis or significant stenosis. Inflow: Patent without evidence of aneurysm, dissection, vasculitis or significant stenosis. Veins: No obvious venous abnormality within the limitations of this arterial phase study. Review of the MIP images confirms the above findings. NON-VASCULAR Hepatobiliary: The liver is normal. Status post cholecystectomy.There is no biliary ductal dilation. Pancreas: Normal contours without ductal dilatation. No peripancreatic fluid collection. Spleen: Unremarkable. Adrenals/Urinary Tract: --Adrenal glands: Unremarkable. --Right kidney/ureter: No hydronephrosis or radiopaque kidney stones. --Left kidney/ureter: No hydronephrosis or radiopaque kidney stones. --Urinary bladder: Unremarkable. Stomach/Bowel: --  Stomach/Duodenum: The stomach is distended. --Small bowel: There is evidence for a moderate to high-grade small bowel obstruction with a transition point at the level of the mid anterior peritoneal cavity. This is likely secondary to enteroperitoneal adhesions. --Colon: Unremarkable. --Appendix: Surgically absent. Vascular/Lymphatic: Normal course and caliber of the major abdominal vessels. --No retroperitoneal lymphadenopathy. --No mesenteric lymphadenopathy. --No pelvic or inguinal lymphadenopathy. Reproductive: Unremarkable Other: No ascites or free air. Again noted are coarse calcifications involving the anterior abdominal wall. Musculoskeletal. No acute displaced fractures. Review of the MIP images confirms the above findings. IMPRESSION: 1. No acute vascular abnormality. 2. There is evidence for a moderate to high-grade small bowel obstruction with a transition point at the level of the mid anterior peritoneal cavity. This is likely secondary to enteroperitoneal adhesions. 3. Cardiomegaly. 4. Multiple small 4-5 mm pulmonary nodules in the right upper lobe. No follow-up needed if patient is  low-risk (and has no known or suspected primary neoplasm). Non-contrast chest CT can be considered in 12 months if patient is high-risk. This recommendation follows the consensus statement: Guidelines for Management of Incidental Pulmonary Nodules Detected on CT Images: From the Fleischner Society 2017; Radiology 2017; 284:228-243. Electronically Signed   By: Constance Holster M.D.   On: 05/28/2020 02:14    Scheduled Meds: . azelastine  2 spray Each Nare BID  . enalaprilat  1.25 mg Intravenous Q6H  . enoxaparin (LOVENOX) injection  70 mg Subcutaneous Q24H  . fluticasone  2 spray Each Nare Daily  . latanoprost  1 drop Both Eyes QHS  . lidocaine  15 mL Mouth/Throat Once  . metoprolol tartrate  5 mg Intravenous Q6H  . montelukast  10 mg Oral QHS  . pantoprazole (PROTONIX) IV  40 mg Intravenous Q12H   Continuous Infusions: . sodium chloride 100 mL/hr at 05/29/20 0528  . piperacillin-tazobactam (ZOSYN)  IV 3.375 g (05/29/20 0527)  . potassium chloride 10 mEq (05/29/20 1328)     LOS: 1 day   Marylu Lund, MD [facility] Pager On Amion  If 7PM-7AM, please contact night-coverage 05/29/2020, 2:03 PM

## 2020-05-29 NOTE — Consult Note (Signed)
[facility] SURGICAL CONSULTATION NOTE (initial) - cpt: 10175   HISTORY OF PRESENT ILLNESS (HPI):  59 y.o. male presented to [facility] ED overnight for evaluation of abdominal pain. Patient reports the acute onset of abdominal pain in the upper portion of his abdomen. This was described to be a sharp and burning pain. He believes this onset just shortly after eating dinner last night. He noted associated nausea and two episodes of non-bloody and non-bilious emesis. No fever, chills, cough, CP, SOB, or urinary changes. He has had similar abdominal pain in the past but this is much worse. He denied any history of obstruction in the past. Patient does have an extensive surgical history including open appendectomy in 1025 complicated by evisceration and SBO requiring multiple take back surgeries. Patient initially with leukocytosis to 12.0k (now 9.0k), renal function was normal with sCr - 0.79, mild hypokalemia to 3.1 (now 3.0), and he did have a lactic acidosis in the ED to 3.1 (resolved with IVF - 1.7). CTA Chest/Abdomen/Pelvis was obtained and concerning for small bowel obstruction. Given that he met sepsis criteria, he was started on Meropenem; however, there was no concern for bowel compromise nor ischemia on imaging review. He was admitted to medicine service. NGT placement could not be completed secondary to "obstruction" and planned placement with IR today.   Surgery is consulted by emergency medicine physician Dr. Hinda Kehr, MD in this context for evaluation and management of SBO.  INTERVAL HISTORY 11.25.21: NGT able to be placed bedside by PA.  Patient reports passing flatus.  Nearly 2 L out of NG tube.  Patient reports feeling markedly better.  PAST MEDICAL HISTORY (PMH):  Past Medical History:  Diagnosis Date  . Bipolar disorder ([place])   . Blood in stool   . Difficult intubation   . Dyspnea   . GERD (gastroesophageal reflux disease)   . Glaucoma    since 2004  .  Hypertension    since 2000  . Obesity, unspecified   . Sleep apnea   . Ulcer 2011   gastric  . Unspecified hemorrhoids without mention of complication      PAST SURGICAL HISTORY ([place]):  Past Surgical History:  Procedure Laterality Date  . APPLICATION OF WOUND VAC N/A 11/16/2016   Procedure: APPLICATION OF WOUND VAC-ABDOMINAL;  Surgeon: Florene Glen, MD;  Location: [facility] ORS;  Service: General;  Laterality: N/A;  . APPLICATION OF WOUND VAC N/A 11/18/2016   Procedure: APPLICATION OF WOUND VAC;  Surgeon: Florene Glen, MD;  Location: [facility];  Service: General;  Laterality: N/A;  change  . APPLICATION OF WOUND VAC N/A 11/23/2016   Procedure: WOUND VAC CHANGE;  Surgeon: Olean Ree, MD;  Location: [facility];  Service: General;  Laterality: N/A;  wound vac application  . CARDIAC CATHETERIZATION Left 06/23/2016   Procedure: Left Heart Cath and Coronary Angiography;  Surgeon: Nelva Bush, MD;  Location: [facility];  Service: Cardiovascular;  Laterality: Left;  . CHOLECYSTECTOMY  1992  . COLONOSCOPY W/ POLYPECTOMY  05/22/2010   37mmtransverse colon polyp, traditional serrated adenoma,negative for high grade dysplasia & malignancy. rectal polyp-97mm,negative for dysplasia & malignancy.  . COLONOSCOPY WITH PROPOFOL N/A 06/20/2019   Procedure: COLONOSCOPY WITH PROPOFOL;  Surgeon: Lin Landsman, MD;  Location: [facility] ENDOSCOPY;  Service: Gastroenterology;  Laterality: N/A;  . COLONOSCOPY WITH PROPOFOL N/A 06/21/2019   Procedure: COLONOSCOPY WITH PROPOFOL;  Surgeon: Lin Landsman, MD;  Location: [facility] ENDOSCOPY;  Service: Gastroenterology;  Laterality: N/A;  .  DRESSING CHANGE UNDER ANESTHESIA N/A 11/21/2016   Procedure: DRESSING CHANGE UNDER ANESTHESIA;  Surgeon: Florene Glen, MD;  Location: [facility];  Service: General;  Laterality: N/A;  . ESOPHAGOGASTRODUODENOSCOPY (EGD) WITH PROPOFOL N/A 06/20/2019   Procedure: ESOPHAGOGASTRODUODENOSCOPY (EGD) WITH PROPOFOL;   Surgeon: Lin Landsman, MD;  Location: [facility] ENDOSCOPY;  Service: Gastroenterology;  Laterality: N/A;  . KNEE SURGERY Left 2010  . LAPAROSCOPIC APPENDECTOMY N/A 10/30/2016   Procedure: APPENDECTOMY LAPAROSCOPIC changed  to open application of wound vac;  Surgeon: [name], MD;  Location: [facility];  Service: General;  Laterality: N/A;  . LAPAROTOMY N/A 11/11/2016   Procedure: EXPLORATORY LAPAROTOMY, DEBRIDEMENT OF ABDOMINAL WOUND, ABDOMINAL [place];  Surgeon: [name], MD;  Location: [facility];  Service: General;  Laterality: N/A;  . LAPAROTOMY N/A 11/12/2016   Procedure: EXPLORATORY LAPAROTOMY;  Surgeon: Clayburn Pert, MD;  Location: [facility];  Service: General;  Laterality: N/A;  . LAPAROTOMY N/A 11/14/2016   Procedure: EXPLORATORY LAPAROTOMY; Irrigation, partial closure;  Surgeon: Clayburn Pert, MD;  Location: [facility];  Service: General;  Laterality: N/A;  . TOTAL KNEE ARTHROPLASTY Left 03/21/2019   Procedure: TOTAL KNEE ARTHROPLASTY;  Surgeon: Lovell Sheehan, MD;  Location: [facility];  Service: Orthopedics;  Laterality: Left;     MEDICATIONS:  Prior to Admission medications   Medication Sig Start Date End Date Taking? Authorizing Provider  ALPRAZolam (XANAX) 0.5 MG tablet TAKE 1 TABLET(0.5 MG) BY MOUTH TWICE DAILY AS NEEDED FOR ANXIETY 05/12/20  Yes Hurst, Teresa T, PA-C  amLODipine (NORVASC) 10 MG tablet TAKE 1 TABLET(10 MG) BY MOUTH DAILY 05/09/20  Yes Jerrol Banana., MD  aspirin EC 81 MG tablet Take 81 mg by mouth daily.   Yes [provider]  atorvastatin (LIPITOR) 40 MG tablet TAKE 1 TABLET DAILY 12/11/19  Yes Jerrol Banana., MD  azelastine (ASTELIN) 0.1 % nasal spray SMARTSIG:1-2 Spray(s) Both Nares Twice Daily 07/19/19  Yes [provider]  b complex vitamins capsule Take 1 capsule by mouth daily. 04/24/19  Yes Hurst, Teresa T, PA-C  carbamazepine (TEGRETOL) 200 MG tablet TAKE 3 TABLETS BY MOUTH EVERY MORNING AND 2 TABLETS BY MOUTH EVERY  NIGHT AT BEDTIME 03/25/20  Yes Hurst, Teresa T, PA-C  Cholecalciferol (NAT-RUL VITAMIN D) 25 MCG (1000 UT) tablet Take 1,000 Units by mouth daily.   Yes [provider]  eplerenone (INSPRA) 50 MG tablet Take 50 mg by mouth daily.   Yes [provider]  fluticasone (FLONASE) 50 MCG/ACT nasal spray SMARTSIG:1-2 Spray(s) Both Nares Daily 07/19/19  Yes [provider]  hydrALAZINE (APRESOLINE) 100 MG tablet TAKE 1 TABLET THREE TIMES A DAY 03/10/20  Yes Jerrol Banana., MD  lamoTRIgine (LAMICTAL) 200 MG tablet TAKE 1 AND 1/2 TABLETS(300 MG) BY MOUTH AT BEDTIME 05/12/20  Yes Hurst, Teresa T, PA-C  latanoprost (XALATAN) 0.005 % ophthalmic solution Place 1 drop into both eyes at bedtime.    Yes [provider]  losartan (COZAAR) 100 MG tablet TAKE 1 TABLET DAILY 03/10/20  Yes Jerrol Banana., MD  metoprolol tartrate (LOPRESSOR) 50 MG tablet TAKE 1 TABLET TWICE A DAY 03/10/20  Yes Jerrol Banana., MD  montelukast (SINGULAIR) 10 MG tablet Take 10 mg by mouth at bedtime.   Yes [provider]  NON FORMULARY CPAP   Yes [provider]  Omega-3 Fatty Acids (FISH OIL) 1000 MG CAPS Take 1 capsule by mouth daily.    Yes [provider]  omeprazole (PRILOSEC) 40 MG capsule Take 1 capsule (40 mg total) by mouth daily before breakfast. 12/04/19  Yes Vanga, Tally Due, MD  sertraline (ZOLOFT) 100 MG tablet TAKE 1 TABLET DAILY 01/15/20  Yes Jerrol Banana., MD  tamsulosin (FLOMAX) 0.4 MG CAPS capsule Take 1 capsule (0.4 mg total) by mouth daily. 03/17/20  Yes Jerrol Banana., MD  vitamin B-12 (CYANOCOBALAMIN) 1000 MCG tablet Take 1,000 mcg by mouth daily.  07/17/19  Yes [provider]  NARCAN 4 MG/0.1ML LIQD nasal spray kit USE 1 SPRAY IEN UTD PRN 04/05/19   [provider]  UNABLE TO FIND 900 mg by Per post-pyloric tube route. Med Name: Kyolic Patient not taking: Reported on 12/19/2019    [provider]      ALLERGIES:  Allergies  Allergen Reactions  . Oxycodone Other (See Comments)    Causes Hallucinations, auditory and tactile  . Isosorbide Nausea And Vomiting    Headache, nausea, vomiting     SOCIAL HISTORY:  Social History   Socioeconomic History  . Marital status: Married    Spouse name: Not on file  . Number of children: Not on file  . Years of education: Not on file  . Highest education level: Not on file  Occupational History  . Not on file  Tobacco Use  . Smoking status: Never Smoker  . Smokeless tobacco: Never Used  Vaping Use  . Vaping Use: Never used  Substance and Sexual Activity  . Alcohol use: Not Currently    Comment: occasionally  . Drug use: No  . Sexual activity: Not on file  Other Topics Concern  . Not on file  Social History Narrative  . Not on file   Social Determinants of Health   Financial Resource Strain:   . Difficulty of Paying Living Expenses: Not on file  Food Insecurity:   . Worried About Charity fundraiser in the Last Year: Not on file  . Ran Out of Food in the Last Year: Not on file  Transportation Needs:   . Lack of Transportation (Medical): Not on file  . Lack of Transportation (Non-Medical): Not on file  Physical Activity:   . Days of Exercise per Week: Not on file  . Minutes of Exercise per Session: Not on file  Stress:   . Feeling of Stress : Not on file  Social Connections:   . Frequency of Communication with Friends and Family: Not on file  . Frequency of Social Gatherings with Friends and Family: Not on file  . Attends Religious Services: Not on file  . Active Member of Clubs or Organizations: Not on file  . Attends Archivist Meetings: Not on file  . Marital Status: Not on file  Intimate Partner Violence:   . Fear of Current or Ex-Partner: Not on file  . Emotionally Abused: Not on file  . Physically Abused: Not on file  . Sexually Abused: Not on file     FAMILY HISTORY:  Family History  Problem  Relation Age of Onset  . Cancer Other        colon  . Hyperlipidemia Mother   . Hypertension Mother       REVIEW OF SYSTEMS:  Review of Systems  Constitutional: Negative for chills and fever.  Respiratory: Negative for cough and shortness of breath.   Cardiovascular: Negative for chest pain and palpitations.  Gastrointestinal: Negative for abdominal pain, nausea and vomiting.  Genitourinary: Negative for dysuria  and urgency.  All other systems reviewed and are negative.   VITAL SIGNS:  Temp:  [97.7 F (36.5 C)-98.9 F (37.2 C)] 98.4 F (36.9 C) (11/25 0800) Pulse Rate:  [57-78] 57 (11/25 0800) Resp:  [18-24] 18 (11/25 0800) BP: (154-191)/(76-108) 154/76 (11/25 0800) SpO2:  [86 %-97 %] 97 % (11/25 0800)     Height: $Remove'5\' 7"'dSMoBxU$  (170.2 cm) Weight: (!) 142.7 kg BMI (Calculated): 49.26   INTAKE/OUTPUT:  11/24 0701 - 11/25 0700 In: -  Out: 2190 [Urine:240; Emesis/NG output:1950]  PHYSICAL EXAM:  Physical Exam Vitals and nursing note reviewed.  Constitutional:      General: He is not in acute distress.    Appearance: He is well-developed. He is obese. He is not ill-appearing.  HENT:     Head: Normocephalic and atraumatic.     Comments: NGT in place with dark fluid in canister.  Eyes:     General: No scleral icterus.    Extraocular Movements: Extraocular movements intact.  Cardiovascular:     Rate and Rhythm: Normal rate and regular rhythm.     Heart sounds: Normal heart sounds. No murmur heard.   Pulmonary:     Effort: Pulmonary effort is normal. No respiratory distress.  Abdominal:     General: Abdomen is protuberant. There is no distension.     Palpations: Abdomen is soft.     Tenderness: There is no abdominal tenderness. There is no guarding or rebound.     Comments: Abdomen is soft, obese, very minimal tenderness (unable to localize), difficult to ascertain distension given body habitus, previous placed mesh palpable, no rebound/guarding.  Genitourinary:    Comments:  Deferred Skin:    General: Skin is warm and dry.     Findings: No erythema or rash.  Neurological:     General: No focal deficit present.     Mental Status: He is alert and oriented to person, place, and time.  Psychiatric:        Mood and Affect: Mood normal.        Behavior: Behavior normal.      Labs:  CBC Latest Ref Rng & Units 05/29/2020 05/28/2020 05/27/2020  WBC 4.0 - 10.5 K/uL 9.0 9.0 12.0(H)  Hemoglobin 13.0 - 17.0 g/dL 14.3 15.1 15.6  Hematocrit 39 - 52 % 43.6 43.8 46.4  Platelets 150 - 400 K/uL 197 213 227   CMP Latest Ref Rng & Units 05/29/2020 05/28/2020 05/27/2020  Glucose 70 - 99 mg/dL 108(H) 118(H) 138(H)  BUN 8 - 23 mg/dL $Remove'23 15 18  'obWgAHN$ Creatinine 0.61 - 1.24 mg/dL 0.78 0.79 0.86  Sodium 135 - 145 mmol/L 141 140 141  Potassium 3.5 - 5.1 mmol/L 2.8(L) 3.0(L) 3.1(L)  Chloride 98 - 111 mmol/L 103 101 98  CO2 22 - 32 mmol/L $RemoveB'29 28 27  'XhFAUcAw$ Calcium 8.9 - 10.3 mg/dL 8.4(L) 8.8(L) 9.3  Total Protein 6.5 - 8.1 g/dL 6.7 - 8.0  Total Bilirubin 0.3 - 1.2 mg/dL 0.8 - 1.0  Alkaline Phos 38 - 126 U/L 98 - 126  AST 15 - 41 U/L 21 - 28  ALT 0 - 44 U/L 16 - 20   Imaging studies:   CTA Chest / Abdomen / Pelvis (05/28/2020) personally reviewed with stomach and small bowel dilation consistent with obstruction with transition in the left mid-abdomen, likely to the anterior abdominal wall, there is mesh seen, no evidence of bowel compromise, ischemia, or free air, and radiologist report reviewed:  IMPRESSION: 1. No acute vascular abnormality. 2. There  is evidence for a moderate to high-grade small bowel obstruction with a transition point at the level of the mid anterior peritoneal cavity. This is likely secondary to enteroperitoneal adhesions. 3. Cardiomegaly. 4. Multiple small 4-5 mm pulmonary nodules in the right upper lobe. No follow-up needed if patient is low-risk (and has no known or suspected primary neoplasm). Non-contrast chest CT can be considered in 12 months if patient  is high-risk. This recommendation follows the consensus statement: Guidelines for Management of Incidental Pulmonary Nodules Detected on CT Images: From the Fleischner Society 2017; Radiology 2017; 284:228-243.   Assessment/Plan: (ICD-10's: K78.609) 59 y.o. male with abdominal pain, nausea, and emesis found to have small bowel obstruction most certainly secondary to post-surgical adhesions, complicated by pertinent comorbidities including obesity and numerous extensive abdominal surgeries.    - Passing flatus, but with high NGT output; continue NGT for now  - No emergent indication for surgical intervention. Given his surgical history, would recommend be very patient with conservative measures as repeat laparotomy would be very extensive.    - NPO + IVF resuscitation; replete electrolytes  - Monitor abdominal examination; on-going bowel function  - Pain control prn (minimize narcotics if feasible); antiemetics prn  - Mobilization encouraged   - Further management per primary service; we will follow   All of the above findings and recommendations were discussed with the patient, and all of patient's questions were answered to his expressed satisfaction.

## 2020-05-30 DIAGNOSIS — R7989 Other specified abnormal findings of blood chemistry: Secondary | ICD-10-CM | POA: Diagnosis not present

## 2020-05-30 DIAGNOSIS — A419 Sepsis, unspecified organism: Secondary | ICD-10-CM | POA: Diagnosis not present

## 2020-05-30 DIAGNOSIS — Z4659 Encounter for fitting and adjustment of other gastrointestinal appliance and device: Secondary | ICD-10-CM | POA: Diagnosis not present

## 2020-05-30 DIAGNOSIS — R6520 Severe sepsis without septic shock: Secondary | ICD-10-CM

## 2020-05-30 LAB — COMPREHENSIVE METABOLIC PANEL
ALT: 17 U/L (ref 0–44)
AST: 22 U/L (ref 15–41)
Albumin: 2.9 g/dL — ABNORMAL LOW (ref 3.5–5.0)
Alkaline Phosphatase: 88 U/L (ref 38–126)
Anion gap: 8 (ref 5–15)
BUN: 20 mg/dL (ref 8–23)
CO2: 28 mmol/L (ref 22–32)
Calcium: 8.1 mg/dL — ABNORMAL LOW (ref 8.9–10.3)
Chloride: 106 mmol/L (ref 98–111)
Creatinine, Ser: 0.71 mg/dL (ref 0.61–1.24)
GFR, Estimated: >60 mL/min (ref >60)
Glucose, Bld: 77 mg/dL (ref 70–99)
Potassium: 3 mmol/L — ABNORMAL LOW (ref 3.5–5.1)
Sodium: 142 mmol/L (ref 135–145)
Total Bilirubin: 0.9 mg/dL (ref 0.3–1.2)
Total Protein: 6.1 g/dL — ABNORMAL LOW (ref 6.5–8.1)

## 2020-05-30 MED ORDER — HYDRALAZINE HCL 20 MG/ML IJ SOLN
10.0000 mg | Freq: Once | INTRAMUSCULAR | Status: AC
  Administered 2020-05-30: 10 mg via INTRAVENOUS
  Filled 2020-05-30: qty 1

## 2020-05-30 MED ORDER — POTASSIUM CHLORIDE 10 MEQ/100ML IV SOLN
10.0000 meq | INTRAVENOUS | Status: AC
  Administered 2020-05-30 (×6): 10 meq via INTRAVENOUS
  Filled 2020-05-30 (×6): qty 100

## 2020-05-30 MED ORDER — LABETALOL HCL 5 MG/ML IV SOLN
5.0000 mg | INTRAVENOUS | Status: DC | PRN
  Administered 2020-05-30 – 2020-05-31 (×4): 5 mg via INTRAVENOUS
  Filled 2020-05-30 (×4): qty 4

## 2020-05-30 MED ORDER — PIPERACILLIN-TAZOBACTAM 3.375 G IVPB
3.3750 g | Freq: Three times a day (TID) | INTRAVENOUS | Status: AC
  Administered 2020-05-30 – 2020-06-02 (×8): 3.375 g via INTRAVENOUS
  Filled 2020-05-30 (×9): qty 50

## 2020-05-30 NOTE — Progress Notes (Signed)
PROGRESS NOTE    Peter [name]  PRF:[id number] DOB: Apr 18, 1961 DOA: 05/28/2020 PCP: Jerrol Banana., MD    Brief Narrative:  59 y.o. obese Caucasian male with a known history of hypertension, bipolar disorder, GERD, sleep apnea and peptic ulcer disease, status post multiple laparotomies including cholecystectomy, appendectomy and ventral herniorrhaphy, who presented to the emergency room with acute onset of recurrent nausea and vomiting with associated generalized abdominal pain, found to have SBO. Pt was admitted with General Surgery following  Assessment & Plan:   Active Problems:   SBO (small bowel obstruction) ([place])   1.  Small bowel obstruction like secondary to adhesions from previous multiple laparotomies, with elevated lactic acid. -abd imaging personally reviewed. Findings worrisome for mod to high grade SBO with transition point at the level of the mid-anterior peritoneal cavity, likely secondary to enteroperitoneal adhesions -Pt continued NPO with NG for decompression -Cont to follow and correct electrolytes, goal K >4 and Mg>2 -Continue with conservative management. Per General Surgery, possible trial of clamping NG tomorrow and PO challenge  2.  Hypokalemia. -Remains low this AM -Will give another run of IVKCl x 6 runs -Repeat bmet in AM  3.  Hypertensive urgency. -Given SBO, cont to hold off on oral home bp meds -Will continue on scheduled IV ACEI and IV beta blocker, titrate as tolerated -Continue on PRN hydralazine -Plan to resume home bp meds when pt is able to reliably tolerate PO meds  4.  Dyslipidemia. -On statin therapy and Lovaza prior to admit -Continue tol hold oral meds while pt is NPO  5.  Bipolar disorder. -Hold Tegretol and Lamictal while pt is NPO, would resume when able to reliably tolerate PO  6.  BPH. -Continue to hold Flomax for now as pt is NPO  7.  Peptic ulcer disease. -Continue on IV PPI therapy.  8.  DVT  prophylaxis. -Subcutaneous Lovenox.  9. OSA -Will continue CPAP as tolerated  10. Acute hypoxemic respiratory failure -Pt is baseline O2 naive, currently requiring 5LNC -CXR ordered and reviewed, no acute process noted -Given recent n/v, suspecting aspiration PNA vs pneumonitis -continue on zosyn, plan to complete total 5 days of abx  DVT prophylaxis: Lovenox subq Code Status: Full Family Communication: Pt in room, family is at bedside  Status is: Inpatient  Remains inpatient appropriate because:IV treatments appropriate due to intensity of illness or inability to take PO and Inpatient level of care appropriate due to severity of illness   Dispo: The patient is from: Home              Anticipated d/c is to: Home              Anticipated d/c date is: > 3 days              Patient currently is not medically stable to d/c.    Consultants:   General Surgery  IR  Procedures:     Antimicrobials: Anti-infectives (From admission, onward)   Start     Dose/Rate Route Frequency Ordered Stop   05/30/20 1445  piperacillin-tazobactam (ZOSYN) IVPB 3.375 g        3.375 g 12.5 mL/hr over 240 Minutes Intravenous Every 8 hours 05/30/20 1351 06/02/20 1359   05/28/20 1700  piperacillin-tazobactam (ZOSYN) IVPB 3.375 g  Status:  Discontinued        3.375 g 12.5 mL/hr over 240 Minutes Intravenous Every 8 hours 05/28/20 1541 05/30/20 1351   05/28/20 0600  ceFEPIme (MAXIPIME) 2  g in sodium chloride 0.9 % 100 mL IVPB  Status:  Discontinued        2 g 200 mL/hr over 30 Minutes Intravenous Every 8 hours 05/28/20 0430 05/28/20 0718   05/28/20 0600  meropenem (MERREM) 1 g in sodium chloride 0.9 % 100 mL IVPB  Status:  Discontinued        1 g 200 mL/hr over 30 Minutes Intravenous Every 8 hours 05/28/20 0430 05/28/20 0449   05/28/20 0315  meropenem (MERREM) 1 g in sodium chloride 0.9 % 100 mL IVPB  Status:  Discontinued        1 g 200 mL/hr over 30 Minutes Intravenous Every 8 hours 05/28/20  0235 05/28/20 1541   05/28/20 0245  ceFEPIme (MAXIPIME) 2 g in sodium chloride 0.9 % 100 mL IVPB  Status:  Discontinued        2 g 200 mL/hr over 30 Minutes Intravenous  Once 05/28/20 0235 05/28/20 0236      Subjective: States feeling better today. No BM yet   Objective: Vitals:   05/29/20 1959 05/29/20 2307 05/30/20 0313 05/30/20 1109  BP: (!) 173/78 (!) 159/92 (!) 153/77 (!) 185/79  Pulse: 61 74 68 63  Resp:  18 20 16   Temp: 98.4 F (36.9 C) 98.1 F (36.7 C) 98.2 F (36.8 C) 98.2 F (36.8 C)  TempSrc: Oral  Oral Oral  SpO2: 99% 97% 97% 97%  Weight:      Height:        Intake/Output Summary (Last 24 hours) at 05/30/2020 1529 Last data filed at 05/30/2020 1145 Gross per 24 hour  Intake 4907.94 ml  Output 1330 ml  Net 3577.94 ml   Filed Weights   05/27/20 2222 05/28/20 0402  Weight: 136.1 kg (!) 142.7 kg    Examination: General exam: Conversant, in no acute distress Respiratory system: normal chest rise, clear, no audible wheezing Cardiovascular system: regular rhythm, s1-s2 Gastrointestinal system: Nondistended, obese, decreased BS Central nervous system: No seizures, no tremors Extremities: No cyanosis, no joint deformities Skin: No rashes, no pallor Psychiatry: Affect normal // no auditory hallucinations   Data Reviewed: I have personally reviewed following labs and imaging studies  CBC: Recent Labs  Lab 05/27/20 2225 05/28/20 0446 05/29/20 0451  WBC 12.0* 9.0 9.0  HGB 15.6 15.1 14.3  HCT 46.4 43.8 43.6  MCV 85.9 87.1 88.6  PLT 227 213 811   Basic Metabolic Panel: Recent Labs  Lab 05/27/20 2225 05/28/20 0446 05/29/20 0451 05/30/20 0504  NA 141 140 141 142  K 3.1* 3.0* 2.8* 3.0*  CL 98 101 103 106  CO2 27 28 29 28   GLUCOSE 138* 118* 108* 77  BUN 18 15 23 20   CREATININE 0.86 0.79 0.78 0.71  CALCIUM 9.3 8.8* 8.4* 8.1*  MG  --  2.0 2.2  --    GFR: Estimated Creatinine Clearance: 132.6 mL/min (by C-G formula based on SCr of 0.71  mg/dL). Liver Function Tests: Recent Labs  Lab 05/27/20 2225 05/29/20 0451 05/30/20 0504  AST 28 21 22   ALT 20 16 17   ALKPHOS 126 98 88  BILITOT 1.0 0.8 0.9  PROT 8.0 6.7 6.1*  ALBUMIN 4.2 3.2* 2.9*   Recent Labs  Lab 05/27/20 2225  LIPASE 44   No results for input(s): AMMONIA in the last 168 hours. Coagulation Profile: Recent Labs  Lab 05/28/20 0446  INR 1.0   Cardiac Enzymes: No results for input(s): CKTOTAL, CKMB, CKMBINDEX, TROPONINI in the last 168 hours. BNP (last  3 results) No results for input(s): PROBNP in the last 8760 hours. HbA1C: No results for input(s): HGBA1C in the last 72 hours. CBG: No results for input(s): GLUCAP in the last 168 hours. Lipid Profile: No results for input(s): CHOL, HDL, LDLCALC, TRIG, CHOLHDL, LDLDIRECT in the last 72 hours. Thyroid Function Tests: No results for input(s): TSH, T4TOTAL, FREET4, T3FREE, THYROIDAB in the last 72 hours. Anemia Panel: No results for input(s): VITAMINB12, FOLATE, FERRITIN, TIBC, IRON, RETICCTPCT in the last 72 hours. Sepsis Labs: Recent Labs  Lab 05/28/20 0124 05/28/20 0248 05/28/20 0446  PROCALCITON  --   --  <0.10  LATICACIDVEN 3.1* 1.7  --     Recent Results (from the past 240 hour(s))  Resp Panel by RT-PCR (Flu A&B, Covid) Nasopharyngeal Swab     Status: None   Collection Time: 05/28/20  1:24 AM   Specimen: Nasopharyngeal Swab; Nasopharyngeal(NP) swabs in vial transport medium  Result Value Ref Range Status   SARS Coronavirus 2 by RT PCR NEGATIVE NEGATIVE Final    Comment: (NOTE) SARS-CoV-2 target nucleic acids are NOT DETECTED.  The SARS-CoV-2 RNA is generally detectable in upper respiratory specimens during the acute phase of infection. The lowest concentration of SARS-CoV-2 viral copies this assay can detect is 138 copies/mL. A negative result does not preclude SARS-Cov-2 infection and should not be used as the sole basis for treatment or other patient management decisions. A  negative result may occur with  improper specimen collection/handling, submission of specimen other than nasopharyngeal swab, presence of viral mutation(s) within the areas targeted by this assay, and inadequate number of viral copies(<138 copies/mL). A negative result must be combined with clinical observations, patient history, and epidemiological information. The expected result is Negative.  Fact Sheet for Patients:  EntrepreneurPulse.com.au  Fact Sheet for Healthcare Providers:  IncredibleEmployment.be  This test is no t yet approved or cleared by the [place] FDA and  has been authorized for detection and/or diagnosis of SARS-CoV-2 by FDA under an Emergency Use Authorization (EUA). This EUA will remain  in effect (meaning this test can be used) for the duration of the COVID-19 declaration under Section 564(b)(1) of the Act, 21 U.S.C.section 360bbb-3(b)(1), unless the authorization is terminated  or revoked sooner.       Influenza A by PCR NEGATIVE NEGATIVE Final   Influenza B by PCR NEGATIVE NEGATIVE Final    Comment: (NOTE) The Xpert Xpress SARS-CoV-2/FLU/RSV plus assay is intended as an aid in the diagnosis of influenza from Nasopharyngeal swab specimens and should not be used as a sole basis for treatment. Nasal washings and aspirates are unacceptable for Xpert Xpress SARS-CoV-2/FLU/RSV testing.  Fact Sheet for Patients: EntrepreneurPulse.com.au  Fact Sheet for Healthcare Providers: IncredibleEmployment.be  This test is not yet approved or cleared by the [place] FDA and has been authorized for detection and/or diagnosis of SARS-CoV-2 by FDA under an Emergency Use Authorization (EUA). This EUA will remain in effect (meaning this test can be used) for the duration of the COVID-19 declaration under Section 564(b)(1) of the Act, 21 U.S.C. section 360bbb-3(b)(1), unless the authorization  is terminated or revoked.  Performed at [facility], [address] [place],  [address]   Culture, blood (single)     Status: None (Preliminary result)   Collection Time: 05/28/20  2:48 AM   Specimen: BLOOD  Result Value Ref Range Status   Specimen Description BLOOD  Final   Special Requests NONE  Final   Culture   Final  NO GROWTH 2 DAYS Performed at [facility], [address] [place], [place] [address]    Report Status PENDING  Incomplete     Radiology Studies: DG Chest [facility] 1 View  Result Date: 05/29/2020 CLINICAL DATA:  Possible aspiration EXAM: PORTABLE CHEST 1 VIEW COMPARISON:  Nov 28, 2016 FINDINGS: The cardiomediastinal silhouette is unchanged and enlarged in contour.Unchanged elevation of the RIGHT hemidiaphragm. Low lung volumes. The enteric tube courses through the chest to the abdomen beyond the field-of-view. No pleural effusion. No pneumothorax. Scattered linear opacities are most consistent with atelectasis. Bronchovascular crowding. Visualized abdomen is unremarkable. Multilevel degenerative changes of the thoracic spine. IMPRESSION: 1. Low lung volumes with scattered atelectasis and bronchovascular crowding. No definite consolidation. Electronically Signed   By: Valentino Saxon MD   On: 05/29/2020 11:07    Scheduled Meds: . azelastine  2 spray Each Nare BID  . enalaprilat  1.25 mg Intravenous Q6H  . enoxaparin (LOVENOX) injection  70 mg Subcutaneous Q24H  . fluticasone  2 spray Each Nare Daily  . latanoprost  1 drop Both Eyes QHS  . metoprolol tartrate  2.5 mg Intravenous Q6H  . montelukast  10 mg Oral QHS  . pantoprazole (PROTONIX) IV  40 mg Intravenous Q12H   Continuous Infusions: . sodium chloride 100 mL/hr at 05/30/20 1145  . piperacillin-tazobactam (ZOSYN)  IV    . potassium chloride 10 mEq (05/30/20 1417)     LOS: 2 days   Marylu Lund, MD [facility] Pager On Amion  If 7PM-7AM, please contact  night-coverage 05/30/2020, 3:29 PM

## 2020-05-30 NOTE — Plan of Care (Signed)
Pt on 3L [place] tolerating well.  Problem: Education: Goal: Knowledge of General Education information will improve Description: Including pain rating scale, medication(s)/side effects and non-pharmacologic comfort measures Outcome: Progressing   Problem: Health Behavior/Discharge Planning: Goal: Ability to manage health-related needs will improve Outcome: Progressing   Problem: Clinical Measurements: Goal: Ability to maintain clinical measurements within normal limits will improve Outcome: Progressing Goal: Will remain free from infection Outcome: Progressing Goal: Diagnostic test results will improve Outcome: Progressing Goal: Respiratory complications will improve Outcome: Progressing Goal: Cardiovascular complication will be avoided Outcome: Progressing   Problem: Activity: Goal: Risk for activity intolerance will decrease Outcome: Progressing   Problem: Nutrition: Goal: Adequate nutrition will be maintained Outcome: Progressing   Problem: Coping: Goal: Level of anxiety will decrease Outcome: Progressing   Problem: Elimination: Goal: Will not experience complications related to bowel motility Outcome: Progressing Goal: Will not experience complications related to urinary retention Outcome: Progressing   Problem: Pain Managment: Goal: General experience of comfort will improve Outcome: Progressing   Problem: Safety: Goal: Ability to remain free from injury will improve Outcome: Progressing   Problem: Skin Integrity: Goal: Risk for impaired skin integrity will decrease Outcome: Progressing

## 2020-05-30 NOTE — Plan of Care (Signed)
Continuing with plan of care. 

## 2020-05-30 NOTE — Progress Notes (Signed)
05/30/2020  Subjective: No acute events overnight.  Patient reports that his abdominal discomfort has improved since admission.  He reports having had flatus yesterday but had very high NG output and so his NG was kept in place.  Today, he denies any flatus and denies any bowel movements.  His pain continues to improve.  NG output was recorded as 150 for 24 hours.  Vital signs: Temp:  [98.1 F (36.7 C)-98.4 F (36.9 C)] 98.2 F (36.8 C) (11/26 1109) Pulse Rate:  [61-74] 63 (11/26 1109) Resp:  [16-20] 16 (11/26 1109) BP: (153-185)/(77-92) 185/79 (11/26 1109) SpO2:  [97 %-99 %] 97 % (11/26 1109)   Intake/Output: 11/25 0701 - 11/26 0700 In: 2939.1 [I.V.:2218.3; IV Piggyback:720.7] Out: 1388 [Urine:1080; Emesis/NG output:150] Last BM Date: 05/27/20  Physical Exam: Constitutional: No acute distress Abdomen: Soft, obese, nondistended, with some soreness to palpation in the left side of his abdomen.  Midline incision is well-healed.  Labs:  Recent Labs    05/28/20 0446 05/29/20 0451  WBC 9.0 9.0  HGB 15.1 14.3  HCT 43.8 43.6  PLT 213 197   Recent Labs    05/29/20 0451 05/30/20 0504  NA 141 142  K 2.8* 3.0*  CL 103 106  CO2 29 28  GLUCOSE 108* 77  BUN 23 20  CREATININE 0.78 0.71  CALCIUM 8.4* 8.1*   Recent Labs    05/28/20 0446  LABPROT 13.2  INR 1.0    Imaging: No results found.  Assessment/Plan: This is a 59 y.o. male with small bowel obstruction.  -Discussed with the patient that although his NG output has decreased, he still really is not having consistent bowel function.  We will keep his NG tube today and potentially may be able to do a clamp trial tomorrow if his bowel function improves. -Recommended that he be out of bed and ambulate in order to help with his breathing function, bowel function, and muscle conditioning.   Melvyn Neth, [facility]

## 2020-05-31 ENCOUNTER — Inpatient Hospital Stay: Payer: Managed Care, Other (non HMO)

## 2020-05-31 DIAGNOSIS — R778 Other specified abnormalities of plasma proteins: Secondary | ICD-10-CM | POA: Diagnosis not present

## 2020-05-31 DIAGNOSIS — Z4659 Encounter for fitting and adjustment of other gastrointestinal appliance and device: Secondary | ICD-10-CM | POA: Diagnosis not present

## 2020-05-31 DIAGNOSIS — K56601 Complete intestinal obstruction, unspecified as to cause: Secondary | ICD-10-CM | POA: Diagnosis not present

## 2020-05-31 LAB — COMPREHENSIVE METABOLIC PANEL
ALT: 21 U/L (ref 0–44)
AST: 28 U/L (ref 15–41)
Albumin: 3 g/dL — ABNORMAL LOW (ref 3.5–5.0)
Alkaline Phosphatase: 93 U/L (ref 38–126)
Anion gap: 11 (ref 5–15)
BUN: 14 mg/dL (ref 8–23)
CO2: 25 mmol/L (ref 22–32)
Calcium: 8.3 mg/dL — ABNORMAL LOW (ref 8.9–10.3)
Chloride: 102 mmol/L (ref 98–111)
Creatinine, Ser: 0.72 mg/dL (ref 0.61–1.24)
GFR, Estimated: >60 mL/min (ref >60)
Glucose, Bld: 86 mg/dL (ref 70–99)
Potassium: 3.4 mmol/L — ABNORMAL LOW (ref 3.5–5.1)
Sodium: 138 mmol/L (ref 135–145)
Total Bilirubin: 1.1 mg/dL (ref 0.3–1.2)
Total Protein: 6.4 g/dL — ABNORMAL LOW (ref 6.5–8.1)

## 2020-05-31 LAB — CBC
HCT: 40.7 % (ref 39.0–52.0)
Hemoglobin: 13.7 g/dL (ref 13.0–17.0)
MCH: 29.2 pg (ref 26.0–34.0)
MCHC: 33.7 g/dL (ref 30.0–36.0)
MCV: 86.8 fL (ref 80.0–100.0)
Platelets: 166 10*3/uL (ref 150–400)
RBC: 4.69 MIL/uL (ref 4.22–5.81)
RDW: 12.3 % (ref 11.5–15.5)
WBC: 7.2 10*3/uL (ref 4.0–10.5)
nRBC: 0 % (ref 0.0–0.2)

## 2020-05-31 LAB — MAGNESIUM: Magnesium: 2.1 mg/dL (ref 1.7–2.4)

## 2020-05-31 LAB — TROPONIN I (HIGH SENSITIVITY)
Troponin I (High Sensitivity): 36 ng/L — ABNORMAL HIGH (ref <18)
Troponin I (High Sensitivity): 42 ng/L — ABNORMAL HIGH (ref <18)

## 2020-05-31 MED ORDER — LORAZEPAM 2 MG/ML IJ SOLN
1.0000 mg | Freq: Once | INTRAMUSCULAR | Status: AC
  Administered 2020-05-31: 1 mg via INTRAVENOUS
  Filled 2020-05-31: qty 1

## 2020-05-31 MED ORDER — METOPROLOL TARTRATE 5 MG/5ML IV SOLN
5.0000 mg | Freq: Four times a day (QID) | INTRAVENOUS | Status: DC
  Administered 2020-05-31 – 2020-06-02 (×7): 5 mg via INTRAVENOUS
  Filled 2020-05-31 (×7): qty 5

## 2020-05-31 MED ORDER — POTASSIUM CHLORIDE 10 MEQ/100ML IV SOLN
10.0000 meq | INTRAVENOUS | Status: AC
  Administered 2020-05-31 (×3): 10 meq via INTRAVENOUS
  Filled 2020-05-31 (×3): qty 100

## 2020-05-31 MED ORDER — HYDRALAZINE HCL 20 MG/ML IJ SOLN
20.0000 mg | INTRAMUSCULAR | Status: DC | PRN
  Administered 2020-05-31 – 2020-06-01 (×4): 20 mg via INTRAVENOUS
  Filled 2020-05-31 (×4): qty 1

## 2020-05-31 NOTE — Plan of Care (Signed)
Continuing with plan of care. 

## 2020-05-31 NOTE — Progress Notes (Signed)
PROGRESS NOTE    Peter [name]  GYI:[id number] DOB: 09/28/1960 DOA: 05/28/2020 PCP: Jerrol Banana., MD    Brief Narrative:  59 y.o. obese Caucasian male with a known history of hypertension, bipolar disorder, GERD, sleep apnea and peptic ulcer disease, status post multiple laparotomies including cholecystectomy, appendectomy and ventral herniorrhaphy, who presented to the emergency room with acute onset of recurrent nausea and vomiting with associated generalized abdominal pain, found to have SBO. Pt was admitted with General Surgery following  Assessment & Plan:   Active Problems:   SBO (small bowel obstruction) ([place])   1.  Small bowel obstruction like secondary to adhesions from previous multiple laparotomies, with elevated lactic acid. -abd imaging personally reviewed. Findings worrisome for mod to high grade SBO with transition point at the level of the mid-anterior peritoneal cavity, likely secondary to enteroperitoneal adhesions -Pt continued NPO with NG for decompression -Cont to follow and correct electrolytes, goal K >4 and Mg>2 -Continue with conservative management.  -Per General Surgery, pt still has high NG output. Possibility for clamping trial tomorrow  2.  Hypokalemia. -Remains low this AM bu improved -Will give another run of IVKCl x 6 runs -Repeat bmet in AM  3.  Hypertensive urgency. -Given SBO, cont to hold off on oral home bp meds -Will continue on scheduled IV ACEI and IV beta blocker, titrate as tolerated -Continue on PRN hydralazine and PRN labetalol as tolerated -Plan to resume home bp meds when pt is able to reliably tolerate PO meds  4.  Dyslipidemia. -On statin therapy and Lovaza prior to admit -Continue tol hold oral meds while pt is NPO  5.  Bipolar disorder. -Hold Tegretol and Lamictal while pt is NPO, would resume when able to reliably tolerate PO  6.  BPH. -Continue to hold Flomax for now as pt is NPO  7.  Peptic ulcer  disease. -Continue on IV PPI therapy.  8.  DVT prophylaxis. -Subcutaneous Lovenox.  9. OSA -Will continue CPAP as tolerated  10. Acute hypoxemic respiratory failure -Pt is baseline O2 naive, currently requiring 5LNC -CXR ordered and reviewed, no acute process noted -Given recent n/v, suspecting aspiration PNA vs pneumonitis -continue on zosyn, will plan tocomplete total 5 days of abx  DVT prophylaxis: Lovenox subq Code Status: Full Family Communication: Pt in room, family is currently not at bedside  Status is: Inpatient  Remains inpatient appropriate because:IV treatments appropriate due to intensity of illness or inability to take PO and Inpatient level of care appropriate due to severity of illness   Dispo: The patient is from: Home              Anticipated d/c is to: Home              Anticipated d/c date is: > 3 days              Patient currently is not medically stable to d/c.    Consultants:   General Surgery  IR  Procedures:     Antimicrobials: Anti-infectives (From admission, onward)   Start     Dose/Rate Route Frequency Ordered Stop   05/30/20 1445  piperacillin-tazobactam (ZOSYN) IVPB 3.375 g        3.375 g 12.5 mL/hr over 240 Minutes Intravenous Every 8 hours 05/30/20 1351 06/02/20 1359   05/28/20 1700  piperacillin-tazobactam (ZOSYN) IVPB 3.375 g  Status:  Discontinued        3.375 g 12.5 mL/hr over 240 Minutes Intravenous Every 8 hours  05/28/20 1541 05/30/20 1351   05/28/20 0600  ceFEPIme (MAXIPIME) 2 g in sodium chloride 0.9 % 100 mL IVPB  Status:  Discontinued        2 g 200 mL/hr over 30 Minutes Intravenous Every 8 hours 05/28/20 0430 05/28/20 0718   05/28/20 0600  meropenem (MERREM) 1 g in sodium chloride 0.9 % 100 mL IVPB  Status:  Discontinued        1 g 200 mL/hr over 30 Minutes Intravenous Every 8 hours 05/28/20 0430 05/28/20 0449   05/28/20 0315  meropenem (MERREM) 1 g in sodium chloride 0.9 % 100 mL IVPB  Status:  Discontinued         1 g 200 mL/hr over 30 Minutes Intravenous Every 8 hours 05/28/20 0235 05/28/20 1541   05/28/20 0245  ceFEPIme (MAXIPIME) 2 g in sodium chloride 0.9 % 100 mL IVPB  Status:  Discontinued        2 g 200 mL/hr over 30 Minutes Intravenous  Once 05/28/20 0235 05/28/20 0236      Subjective: Reports feeling better, however no BM or flatus this AM  Objective: Vitals:   05/31/20 0444 05/31/20 0552 05/31/20 1100 05/31/20 1148  BP: (!) 187/81 (!) 159/81 (!) 178/72 (!) 170/67  Pulse: 67 66  78  Resp: 18 18  20   Temp: 98.5 F (36.9 C)   98 F (36.7 C)  TempSrc: Oral     SpO2: 97% 95%  94%  Weight:      Height:        Intake/Output Summary (Last 24 hours) at 05/31/2020 1444 Last data filed at 05/31/2020 1206 Gross per 24 hour  Intake 899.06 ml  Output 2155 ml  Net -1255.94 ml   Filed Weights   05/27/20 2222 05/28/20 0402  Weight: 136.1 kg (!) 142.7 kg    Examination: General exam: Awake, laying in bed, in nad Respiratory system: Normal respiratory effort, no wheezing Cardiovascular system: regular rate, s1, s2 Gastrointestinal system: Soft, nondistended, positive BS, NG in place Central nervous system: CN2-12 grossly intact, strength intact Extremities: Perfused, no clubbing Skin: Normal skin turgor, no notable skin lesions seen Psychiatry: Mood normal // no visual hallucinations   Data Reviewed: I have personally reviewed following labs and imaging studies  CBC: Recent Labs  Lab 05/27/20 2225 05/28/20 0446 05/29/20 0451 05/31/20 0600  WBC 12.0* 9.0 9.0 7.2  HGB 15.6 15.1 14.3 13.7  HCT 46.4 43.8 43.6 40.7  MCV 85.9 87.1 88.6 86.8  PLT 227 213 197 062   Basic Metabolic Panel: Recent Labs  Lab 05/27/20 2225 05/28/20 0446 05/29/20 0451 05/30/20 0504 05/31/20 0600  NA 141 140 141 142 138  K 3.1* 3.0* 2.8* 3.0* 3.4*  CL 98 101 103 106 102  CO2 27 28 29 28 25   GLUCOSE 138* 118* 108* 77 86  BUN 18 15 23 20 14   CREATININE 0.86 0.79 0.78 0.71 0.72  CALCIUM 9.3  8.8* 8.4* 8.1* 8.3*  MG  --  2.0 2.2  --  2.1   GFR: Estimated Creatinine Clearance: 132.6 mL/min (by C-G formula based on SCr of 0.72 mg/dL). Liver Function Tests: Recent Labs  Lab 05/27/20 2225 05/29/20 0451 05/30/20 0504 05/31/20 0600  AST 28 21 22 28   ALT 20 16 17 21   ALKPHOS 126 98 88 93  BILITOT 1.0 0.8 0.9 1.1  PROT 8.0 6.7 6.1* 6.4*  ALBUMIN 4.2 3.2* 2.9* 3.0*   Recent Labs  Lab 05/27/20 2225  LIPASE 44  No results for input(s): AMMONIA in the last 168 hours. Coagulation Profile: Recent Labs  Lab 05/28/20 0446  INR 1.0   Cardiac Enzymes: No results for input(s): CKTOTAL, CKMB, CKMBINDEX, TROPONINI in the last 168 hours. BNP (last 3 results) No results for input(s): PROBNP in the last 8760 hours. HbA1C: No results for input(s): HGBA1C in the last 72 hours. CBG: No results for input(s): GLUCAP in the last 168 hours. Lipid Profile: No results for input(s): CHOL, HDL, LDLCALC, TRIG, CHOLHDL, LDLDIRECT in the last 72 hours. Thyroid Function Tests: No results for input(s): TSH, T4TOTAL, FREET4, T3FREE, THYROIDAB in the last 72 hours. Anemia Panel: No results for input(s): VITAMINB12, FOLATE, FERRITIN, TIBC, IRON, RETICCTPCT in the last 72 hours. Sepsis Labs: Recent Labs  Lab 05/28/20 0124 05/28/20 0248 05/28/20 0446  PROCALCITON  --   --  <0.10  LATICACIDVEN 3.1* 1.7  --     Recent Results (from the past 240 hour(s))  Resp Panel by RT-PCR (Flu A&B, Covid) Nasopharyngeal Swab     Status: None   Collection Time: 05/28/20  1:24 AM   Specimen: Nasopharyngeal Swab; Nasopharyngeal(NP) swabs in vial transport medium  Result Value Ref Range Status   SARS Coronavirus 2 by RT PCR NEGATIVE NEGATIVE Final    Comment: (NOTE) SARS-CoV-2 target nucleic acids are NOT DETECTED.  The SARS-CoV-2 RNA is generally detectable in upper respiratory specimens during the acute phase of infection. The lowest concentration of SARS-CoV-2 viral copies this assay can detect  is 138 copies/mL. A negative result does not preclude SARS-Cov-2 infection and should not be used as the sole basis for treatment or other patient management decisions. A negative result may occur with  improper specimen collection/handling, submission of specimen other than nasopharyngeal swab, presence of viral mutation(s) within the areas targeted by this assay, and inadequate number of viral copies(<138 copies/mL). A negative result must be combined with clinical observations, patient history, and epidemiological information. The expected result is Negative.  Fact Sheet for Patients:  EntrepreneurPulse.com.au  Fact Sheet for Healthcare Providers:  IncredibleEmployment.be  This test is no t yet approved or cleared by the [place] FDA and  has been authorized for detection and/or diagnosis of SARS-CoV-2 by FDA under an Emergency Use Authorization (EUA). This EUA will remain  in effect (meaning this test can be used) for the duration of the COVID-19 declaration under Section 564(b)(1) of the Act, 21 U.S.C.section 360bbb-3(b)(1), unless the authorization is terminated  or revoked sooner.       Influenza A by PCR NEGATIVE NEGATIVE Final   Influenza B by PCR NEGATIVE NEGATIVE Final    Comment: (NOTE) The Xpert Xpress SARS-CoV-2/FLU/RSV plus assay is intended as an aid in the diagnosis of influenza from Nasopharyngeal swab specimens and should not be used as a sole basis for treatment. Nasal washings and aspirates are unacceptable for Xpert Xpress SARS-CoV-2/FLU/RSV testing.  Fact Sheet for Patients: EntrepreneurPulse.com.au  Fact Sheet for Healthcare Providers: IncredibleEmployment.be  This test is not yet approved or cleared by the [place] FDA and has been authorized for detection and/or diagnosis of SARS-CoV-2 by FDA under an Emergency Use Authorization (EUA). This EUA will remain in effect  (meaning this test can be used) for the duration of the COVID-19 declaration under Section 564(b)(1) of the Act, 21 U.S.C. section 360bbb-3(b)(1), unless the authorization is terminated or revoked.  Performed at [name], [address] [place], [place] [address]   Culture, blood (single)     Status: None (Preliminary  result)   Collection Time: 05/28/20  2:48 AM   Specimen: BLOOD  Result Value Ref Range Status   Specimen Description BLOOD  Final   Special Requests NONE  Final   Culture   Final    NO GROWTH 3 DAYS Performed at [facility], [address] [place], [place] [address]    Report Status PENDING  Incomplete     Radiology Studies: DG Abd 1 View  Result Date: 05/31/2020 CLINICAL DATA:  60 year old male with small bowel obstruction. EXAM: ABDOMEN - 1 VIEW COMPARISON:  05/28/2020 FINDINGS: Partially visualized gastric decompression tube, unable to confidently described position based on incomplete capturing of the upper abdomen on these radiographs. The bowel gas pattern is normal. Irregular appearing coarse calcifications project of the left abdomen, compatible with findings on recent CT. IMPRESSION: Nonobstructive bowel gas pattern. Incomplete evaluation of the indwelling gastric decompression tube, with the tip in the left upper quadrant. Electronically Signed   By: Ruthann Cancer MD   On: 05/31/2020 10:50    Scheduled Meds: . azelastine  2 spray Each Nare BID  . enalaprilat  1.25 mg Intravenous Q6H  . enoxaparin (LOVENOX) injection  70 mg Subcutaneous Q24H  . fluticasone  2 spray Each Nare Daily  . latanoprost  1 drop Both Eyes QHS  . metoprolol tartrate  5 mg Intravenous Q6H  . montelukast  10 mg Oral QHS  . pantoprazole (PROTONIX) IV  40 mg Intravenous Q12H   Continuous Infusions: . sodium chloride 100 mL/hr at 05/30/20 2205  . piperacillin-tazobactam (ZOSYN)  IV 3.375 g (05/31/20 1254)  . potassium chloride 10 mEq (05/31/20 1115)      LOS: 3 days   Marylu Lund, MD [facility] Pager On Amion  If 7PM-7AM, please contact night-coverage 05/31/2020, 2:44 PM

## 2020-05-31 NOTE — Progress Notes (Signed)
05/31/2020  Subjective: No acute events.  Patient has had a good sized bowel movement this morning.  However his NG output was also high at 800.  Reports that his abdomen feels much better today and is softer.  Vital signs: Temp:  [98 F (36.7 C)-99 F (37.2 C)] 98 F (36.7 C) (11/27 1148) Pulse Rate:  [66-81] 78 (11/27 1148) Resp:  [16-20] 20 (11/27 1148) BP: (152-187)/(67-89) 170/67 (11/27 1148) SpO2:  [94 %-98 %] 94 % (11/27 1148)   Intake/Output: 11/26 0701 - 11/27 0700 In: 2867.9 [I.V.:2363.8; IV Piggyback:504.1] Out: 1680 [Urine:880; Emesis/NG output:800] Last BM Date: 05/29/20  Physical Exam: Constitutional: No acute distress Abdomen: Soft, obese, less distended, nontender to palpation.  Distention has definitely improved.  NG tube in place.  Labs:  Recent Labs    05/29/20 0451 05/31/20 0600  WBC 9.0 7.2  HGB 14.3 13.7  HCT 43.6 40.7  PLT 197 166   Recent Labs    05/30/20 0504 05/31/20 0600  NA 142 138  K 3.0* 3.4*  CL 106 102  CO2 28 25  GLUCOSE 77 86  BUN 20 14  CREATININE 0.71 0.72  CALCIUM 8.1* 8.3*   No results for input(s): LABPROT, INR in the last 72 hours.  Imaging: DG Abd 1 View  Result Date: 05/31/2020 CLINICAL DATA:  59 year old male with small bowel obstruction. EXAM: ABDOMEN - 1 VIEW COMPARISON:  05/28/2020 FINDINGS: Partially visualized gastric decompression tube, unable to confidently described position based on incomplete capturing of the upper abdomen on these radiographs. The bowel gas pattern is normal. Irregular appearing coarse calcifications project of the left abdomen, compatible with findings on recent CT. IMPRESSION: Nonobstructive bowel gas pattern. Incomplete evaluation of the indwelling gastric decompression tube, with the tip in the left upper quadrant. Electronically Signed   By: Ruthann Cancer MD   On: 05/31/2020 10:50    Assessment/Plan: This is a 59 y.o. male with small bowel obstruction, now appearing to be  resolving.  -Also his bowel function seems to be improving today, his NG output for 24 hours was too high.  We will continue NG tube to suction today but I think if he continues to progress this way, we may be able to DC the NG tube tomorrow.  KUB this morning certainly looks much improved as well. -Keep n.p.o. today, with NG tube to suction, and IV fluid hydration -Out of bed, ambulate.   Melvyn Neth, [facility]

## 2020-05-31 NOTE — Progress Notes (Signed)
Upon entering room patient noted to be red to the face, B/P was obtained and continues to be elevated, patient still has not passed any gas and left upper quadrant noted to be firm, patient further had an episode of vomiting brown colored emesis.  PRN B/P medication given along with Zofran for nausea and morphine for c/o headache.  Dr. Hampton Abbot and Dr. Wyline Copas notified of patient current symptoms.

## 2020-06-01 DIAGNOSIS — K56601 Complete intestinal obstruction, unspecified as to cause: Secondary | ICD-10-CM | POA: Diagnosis not present

## 2020-06-01 DIAGNOSIS — R778 Other specified abnormalities of plasma proteins: Secondary | ICD-10-CM | POA: Diagnosis not present

## 2020-06-01 LAB — BASIC METABOLIC PANEL
Anion gap: 9 (ref 5–15)
BUN: 11 mg/dL (ref 8–23)
CO2: 27 mmol/L (ref 22–32)
Calcium: 8.3 mg/dL — ABNORMAL LOW (ref 8.9–10.3)
Chloride: 101 mmol/L (ref 98–111)
Creatinine, Ser: 0.58 mg/dL — ABNORMAL LOW (ref 0.61–1.24)
GFR, Estimated: >60 mL/min (ref >60)
Glucose, Bld: 94 mg/dL (ref 70–99)
Potassium: 3.1 mmol/L — ABNORMAL LOW (ref 3.5–5.1)
Sodium: 137 mmol/L (ref 135–145)

## 2020-06-01 LAB — MAGNESIUM: Magnesium: 2 mg/dL (ref 1.7–2.4)

## 2020-06-01 NOTE — Progress Notes (Signed)
06/01/2020  Subjective: Patient yesterday had an episode of left upper quadrant pain with nausea and emesis despite of the NG tube.  KUB was repeated which did not show any bowel dilatation, EKG was normal, troponin was flat.  This was attributed to an episode of anxiety.  This morning, the patient reports that he is feeling very well without any other issues.  Denies any significant abdominal pain.  Has had 2 bowel movements this morning.  However his NG output remains high at 1100.  Vital signs: Temp:  [98.1 F (36.7 C)-99 F (37.2 C)] 98.5 F (36.9 C) (11/28 1200) Pulse Rate:  [71-82] 71 (11/28 1200) Resp:  [19-20] 19 (11/28 0520) BP: (167-185)/(70-91) 177/88 (11/28 1200) SpO2:  [92 %-95 %] 93 % (11/28 1200)   Intake/Output: 11/27 0701 - 11/28 0700 In: 3415.9 [I.V.:2915.8; IV Piggyback:500.1] Out: 2505 [LZJQB:3419; Emesis/NG output:1100] Last BM Date: 06/01/20  Physical Exam: Constitutional: No acute distress Abdomen: Soft, obese, nondistended, nontender to palpation.  There is an area in the mid to left upper quadrant of firmness or hardness that is related to scar tissue from his prior surgery.  Otherwise there is no true heart abdomen issues.  Labs:  Recent Labs    05/31/20 0600  WBC 7.2  HGB 13.7  HCT 40.7  PLT 166   Recent Labs    05/31/20 0600 06/01/20 0618  NA 138 137  K 3.4* 3.1*  CL 102 101  CO2 25 27  GLUCOSE 86 94  BUN 14 11  CREATININE 0.72 0.58*  CALCIUM 8.3* 8.3*   No results for input(s): LABPROT, INR in the last 72 hours.  Imaging: DG Abd 2 Views  Result Date: 05/31/2020 CLINICAL DATA:  Active vomiting. EXAM: ABDOMEN - 2 VIEW COMPARISON:  None. FINDINGS: Nonobstructive bowel gas pattern. There is no evidence of free air. No radio-opaque calculi or other significant radiographic abnormality is seen. Enteric catheter overlies the expected location of proximal gastric body. IMPRESSION: Nonobstructive bowel gas pattern. Electronically Signed   By:  Fidela Salisbury M.D.   On: 05/31/2020 19:11    Assessment/Plan: This is a 59 y.o. male with small bowel obstruction, now resolving.  -Due to his much improved and consistent bowel function, we will do a NG tube clamping trials this morning.  If the residuals are less than 150 mL, then we will remove the NG tube. -If are able to remove the NG tube, then I will start clear liquid diet for dinner. -Continue out of bed and ambulation, will order PT consult for assistance with his deconditioning. -Continue DVT and PPI prophylaxis   Melvyn Neth, [place]

## 2020-06-01 NOTE — Progress Notes (Signed)
PROGRESS NOTE    Peter MESSER  ZGY:[id number] DOB: 1961-02-23 DOA: 05/28/2020 PCP: Jerrol Banana., MD    Brief Narrative:  59 y.o. obese Caucasian male with a known history of hypertension, bipolar disorder, GERD, sleep apnea and peptic ulcer disease, status post multiple laparotomies including cholecystectomy, appendectomy and ventral herniorrhaphy, who presented to the emergency room with acute onset of recurrent nausea and vomiting with associated generalized abdominal pain, found to have SBO. Pt was admitted with General Surgery following  Assessment & Plan:   Active Problems:   SBO (small bowel obstruction) ([place])   1.  Small bowel obstruction like secondary to adhesions from previous multiple laparotomies, with elevated lactic acid. -abd imaging personally reviewed. Findings worrisome for mod to high grade SBO with transition point at the level of the mid-anterior peritoneal cavity, likely secondary to enteroperitoneal adhesions -Cont to follow and correct electrolytes, goal K >4 and Mg>2 -Per General Surgery, NG clamped today. Anticipate transitioning back to home meds when pt is cleared for PO  2.  Hypokalemia. -Remains low this today -KCl replacement ordered -Repeat bmet in AM  3.  Hypertensive urgency. -Given SBO, oral home bp meds have been held -Will continue on scheduled IV ACEI and IV beta blocker, titrate as tolerated -Continue on PRN hydralazine and PRN labetalol as tolerated -anticipate resuming home bp meds when pt is able to reliably tolerate PO meds  4.  Dyslipidemia. -On statin therapy and Lovaza prior to admit -Continue tol hold oral meds while pt is NPO  5.  Bipolar disorder. -Hold Tegretol and Lamictal while pt is NPO, would resume when able to reliably tolerate PO  6.  BPH. -Continue to hold Flomax for now as pt is NPO  7.  Peptic ulcer disease. -Continue on IV PPI therapy.  8.  DVT prophylaxis. -Subcutaneous Lovenox.  9.  OSA -Will continue CPAP as tolerated  10. Acute hypoxemic respiratory failure -Pt is baseline O2 naive, currently requiring 5LNC -CXR ordered and reviewed, no acute process noted -Given recent n/v, suspecting aspiration PNA vs pneumonitis -continue on zosyn, will plan tocomplete total 5 days of abx  DVT prophylaxis: Lovenox subq Code Status: Full Family Communication: Pt in room, family is currently not at bedside  Status is: Inpatient  Remains inpatient appropriate because:IV treatments appropriate due to intensity of illness or inability to take PO and Inpatient level of care appropriate due to severity of illness   Dispo: The patient is from: Home              Anticipated d/c is to: Home              Anticipated d/c date is: > 3 days              Patient currently is not medically stable to d/c.    Consultants:   General Surgery  IR  Procedures:     Antimicrobials: Anti-infectives (From admission, onward)   Start     Dose/Rate Route Frequency Ordered Stop   05/30/20 1445  piperacillin-tazobactam (ZOSYN) IVPB 3.375 g        3.375 g 12.5 mL/hr over 240 Minutes Intravenous Every 8 hours 05/30/20 1351 06/02/20 1359   05/28/20 1700  piperacillin-tazobactam (ZOSYN) IVPB 3.375 g  Status:  Discontinued        3.375 g 12.5 mL/hr over 240 Minutes Intravenous Every 8 hours 05/28/20 1541 05/30/20 1351   05/28/20 0600  ceFEPIme (MAXIPIME) 2 g in sodium chloride 0.9 % 100  mL IVPB  Status:  Discontinued        2 g 200 mL/hr over 30 Minutes Intravenous Every 8 hours 05/28/20 0430 05/28/20 0718   05/28/20 0600  meropenem (MERREM) 1 g in sodium chloride 0.9 % 100 mL IVPB  Status:  Discontinued        1 g 200 mL/hr over 30 Minutes Intravenous Every 8 hours 05/28/20 0430 05/28/20 0449   05/28/20 0315  meropenem (MERREM) 1 g in sodium chloride 0.9 % 100 mL IVPB  Status:  Discontinued        1 g 200 mL/hr over 30 Minutes Intravenous Every 8 hours 05/28/20 0235 05/28/20 1541    05/28/20 0245  ceFEPIme (MAXIPIME) 2 g in sodium chloride 0.9 % 100 mL IVPB  Status:  Discontinued        2 g 200 mL/hr over 30 Minutes Intravenous  Once 05/28/20 0235 05/28/20 0236      Subjective: Moving bowels this AM  Objective: Vitals:   05/31/20 2231 06/01/20 0026 06/01/20 0520 06/01/20 1200  BP: (!) 176/81 (!) 179/79 (!) 174/91 (!) 177/88  Pulse:  82 73 71  Resp:  20 19   Temp:  98.7 F (37.1 C) 99 F (37.2 C) 98.5 F (36.9 C)  TempSrc:  Oral Oral Oral  SpO2:  92% 93% 93%  Weight:      Height:        Intake/Output Summary (Last 24 hours) at 06/01/2020 1358 Last data filed at 06/01/2020 1344 Gross per 24 hour  Intake 4097.32 ml  Output 2550 ml  Net 1547.32 ml   Filed Weights   05/27/20 2222 05/28/20 0402  Weight: 136.1 kg (!) 142.7 kg    Examination: General exam: Conversant, in no acute distress Respiratory system: normal chest rise, clear, no audible wheezing Cardiovascular system: regular rhythm, s1-s2 Gastrointestinal system: Nondistended, nontender, pos BS Central nervous system: No seizures, no tremors Extremities: No cyanosis, no joint deformities Skin: No rashes, no pallor Psychiatry: Affect normal // no auditory hallucinations   Data Reviewed: I have personally reviewed following labs and imaging studies  CBC: Recent Labs  Lab 05/27/20 2225 05/28/20 0446 05/29/20 0451 05/31/20 0600  WBC 12.0* 9.0 9.0 7.2  HGB 15.6 15.1 14.3 13.7  HCT 46.4 43.8 43.6 40.7  MCV 85.9 87.1 88.6 86.8  PLT 227 213 197 355   Basic Metabolic Panel: Recent Labs  Lab 05/28/20 0446 05/29/20 0451 05/30/20 0504 05/31/20 0600 06/01/20 0618  NA 140 141 142 138 137  K 3.0* 2.8* 3.0* 3.4* 3.1*  CL 101 103 106 102 101  CO2 28 29 28 25 27   GLUCOSE 118* 108* 77 86 94  BUN 15 23 20 14 11   CREATININE 0.79 0.78 0.71 0.72 0.58*  CALCIUM 8.8* 8.4* 8.1* 8.3* 8.3*  MG 2.0 2.2  --  2.1 2.0   GFR: Estimated Creatinine Clearance: 132.6 mL/min (A) (by C-G formula based  on SCr of 0.58 mg/dL (L)). Liver Function Tests: Recent Labs  Lab 05/27/20 2225 05/29/20 0451 05/30/20 0504 05/31/20 0600  AST 28 21 22 28   ALT 20 16 17 21   ALKPHOS 126 98 88 93  BILITOT 1.0 0.8 0.9 1.1  PROT 8.0 6.7 6.1* 6.4*  ALBUMIN 4.2 3.2* 2.9* 3.0*   Recent Labs  Lab 05/27/20 2225  LIPASE 44   No results for input(s): AMMONIA in the last 168 hours. Coagulation Profile: Recent Labs  Lab 05/28/20 0446  INR 1.0   Cardiac Enzymes: No results for  input(s): CKTOTAL, CKMB, CKMBINDEX, TROPONINI in the last 168 hours. BNP (last 3 results) No results for input(s): PROBNP in the last 8760 hours. HbA1C: No results for input(s): HGBA1C in the last 72 hours. CBG: No results for input(s): GLUCAP in the last 168 hours. Lipid Profile: No results for input(s): CHOL, HDL, LDLCALC, TRIG, CHOLHDL, LDLDIRECT in the last 72 hours. Thyroid Function Tests: No results for input(s): TSH, T4TOTAL, FREET4, T3FREE, THYROIDAB in the last 72 hours. Anemia Panel: No results for input(s): VITAMINB12, FOLATE, FERRITIN, TIBC, IRON, RETICCTPCT in the last 72 hours. Sepsis Labs: Recent Labs  Lab 05/28/20 0124 05/28/20 0248 05/28/20 0446  PROCALCITON  --   --  <0.10  LATICACIDVEN 3.1* 1.7  --     Recent Results (from the past 240 hour(s))  Resp Panel by RT-PCR (Flu A&B, Covid) Nasopharyngeal Swab     Status: None   Collection Time: 05/28/20  1:24 AM   Specimen: Nasopharyngeal Swab; Nasopharyngeal(NP) swabs in vial transport medium  Result Value Ref Range Status   SARS Coronavirus 2 by RT PCR NEGATIVE NEGATIVE Final    Comment: (NOTE) SARS-CoV-2 target nucleic acids are NOT DETECTED.  The SARS-CoV-2 RNA is generally detectable in upper respiratory specimens during the acute phase of infection. The lowest concentration of SARS-CoV-2 viral copies this assay can detect is 138 copies/mL. A negative result does not preclude SARS-Cov-2 infection and should not be used as the sole basis for  treatment or other patient management decisions. A negative result may occur with  improper specimen collection/handling, submission of specimen other than nasopharyngeal swab, presence of viral mutation(s) within the areas targeted by this assay, and inadequate number of viral copies(<138 copies/mL). A negative result must be combined with clinical observations, patient history, and epidemiological information. The expected result is Negative.  Fact Sheet for Patients:  EntrepreneurPulse.com.au  Fact Sheet for Healthcare Providers:  IncredibleEmployment.be  This test is no t yet approved or cleared by the [place] FDA and  has been authorized for detection and/or diagnosis of SARS-CoV-2 by FDA under an Emergency Use Authorization (EUA). This EUA will remain  in effect (meaning this test can be used) for the duration of the COVID-19 declaration under Section 564(b)(1) of the Act, 21 U.S.C.section 360bbb-3(b)(1), unless the authorization is terminated  or revoked sooner.       Influenza A by PCR NEGATIVE NEGATIVE Final   Influenza B by PCR NEGATIVE NEGATIVE Final    Comment: (NOTE) The Xpert Xpress SARS-CoV-2/FLU/RSV plus assay is intended as an aid in the diagnosis of influenza from Nasopharyngeal swab specimens and should not be used as a sole basis for treatment. Nasal washings and aspirates are unacceptable for Xpert Xpress SARS-CoV-2/FLU/RSV testing.  Fact Sheet for Patients: EntrepreneurPulse.com.au  Fact Sheet for Healthcare Providers: IncredibleEmployment.be  This test is not yet approved or cleared by the [place] FDA and has been authorized for detection and/or diagnosis of SARS-CoV-2 by FDA under an Emergency Use Authorization (EUA). This EUA will remain in effect (meaning this test can be used) for the duration of the COVID-19 declaration under Section 564(b)(1) of the Act, 21  U.S.C. section 360bbb-3(b)(1), unless the authorization is terminated or revoked.  Performed at [facility], [address] [place], [place] [address]   Culture, blood (single)     Status: None (Preliminary result)   Collection Time: 05/28/20  2:48 AM   Specimen: BLOOD  Result Value Ref Range Status   Specimen Description BLOOD  Final  Special Requests NONE  Final   Culture   Final    NO GROWTH 4 DAYS Performed at [facility], [address] [place], [place] [address]    Report Status PENDING  Incomplete     Radiology Studies: DG Abd 1 View  Result Date: 05/31/2020 CLINICAL DATA:  59 year old male with small bowel obstruction. EXAM: ABDOMEN - 1 VIEW COMPARISON:  05/28/2020 FINDINGS: Partially visualized gastric decompression tube, unable to confidently described position based on incomplete capturing of the upper abdomen on these radiographs. The bowel gas pattern is normal. Irregular appearing coarse calcifications project of the left abdomen, compatible with findings on recent CT. IMPRESSION: Nonobstructive bowel gas pattern. Incomplete evaluation of the indwelling gastric decompression tube, with the tip in the left upper quadrant. Electronically Signed   By: Ruthann Cancer MD   On: 05/31/2020 10:50   DG Abd 2 Views  Result Date: 05/31/2020 CLINICAL DATA:  Active vomiting. EXAM: ABDOMEN - 2 VIEW COMPARISON:  None. FINDINGS: Nonobstructive bowel gas pattern. There is no evidence of free air. No radio-opaque calculi or other significant radiographic abnormality is seen. Enteric catheter overlies the expected location of proximal gastric body. IMPRESSION: Nonobstructive bowel gas pattern. Electronically Signed   By: Fidela Salisbury M.D.   On: 05/31/2020 19:11    Scheduled Meds: . azelastine  2 spray Each Nare BID  . enalaprilat  1.25 mg Intravenous Q6H  . enoxaparin (LOVENOX) injection  70 mg Subcutaneous Q24H  . fluticasone  2 spray Each Nare Daily  .  latanoprost  1 drop Both Eyes QHS  . metoprolol tartrate  5 mg Intravenous Q6H  . montelukast  10 mg Oral QHS  . pantoprazole (PROTONIX) IV  40 mg Intravenous Q12H   Continuous Infusions: . sodium chloride 100 mL/hr at 06/01/20 1344  . piperacillin-tazobactam (ZOSYN)  IV 3.375 g (06/01/20 1344)     LOS: 4 days   Marylu Lund, MD [facility] Pager On Amion  If 7PM-7AM, please contact night-coverage 06/01/2020, 1:58 PM

## 2020-06-01 NOTE — Plan of Care (Signed)
Continuing with plan of care. 

## 2020-06-02 DIAGNOSIS — R778 Other specified abnormalities of plasma proteins: Secondary | ICD-10-CM | POA: Diagnosis not present

## 2020-06-02 DIAGNOSIS — K56601 Complete intestinal obstruction, unspecified as to cause: Secondary | ICD-10-CM | POA: Diagnosis not present

## 2020-06-02 LAB — COMPREHENSIVE METABOLIC PANEL
ALT: 39 U/L (ref 0–44)
AST: 47 U/L — ABNORMAL HIGH (ref 15–41)
Albumin: 3 g/dL — ABNORMAL LOW (ref 3.5–5.0)
Alkaline Phosphatase: 81 U/L (ref 38–126)
Anion gap: 10 (ref 5–15)
BUN: 12 mg/dL (ref 8–23)
CO2: 27 mmol/L (ref 22–32)
Calcium: 8.3 mg/dL — ABNORMAL LOW (ref 8.9–10.3)
Chloride: 102 mmol/L (ref 98–111)
Creatinine, Ser: 0.62 mg/dL (ref 0.61–1.24)
GFR, Estimated: >60 mL/min (ref >60)
Glucose, Bld: 93 mg/dL (ref 70–99)
Potassium: 2.9 mmol/L — ABNORMAL LOW (ref 3.5–5.1)
Sodium: 139 mmol/L (ref 135–145)
Total Bilirubin: 0.9 mg/dL (ref 0.3–1.2)
Total Protein: 6.1 g/dL — ABNORMAL LOW (ref 6.5–8.1)

## 2020-06-02 LAB — CULTURE, BLOOD (SINGLE): Culture: NO GROWTH

## 2020-06-02 LAB — MAGNESIUM: Magnesium: 2.1 mg/dL (ref 1.7–2.4)

## 2020-06-02 MED ORDER — SPIRONOLACTONE 25 MG PO TABS
25.0000 mg | ORAL_TABLET | Freq: Every day | ORAL | Status: DC
  Administered 2020-06-02 – 2020-06-03 (×2): 25 mg via ORAL
  Filled 2020-06-02 (×2): qty 1

## 2020-06-02 MED ORDER — ATORVASTATIN CALCIUM 20 MG PO TABS
40.0000 mg | ORAL_TABLET | Freq: Every day | ORAL | Status: DC
  Administered 2020-06-02: 40 mg via ORAL
  Filled 2020-06-02: qty 2

## 2020-06-02 MED ORDER — LOSARTAN POTASSIUM 50 MG PO TABS
100.0000 mg | ORAL_TABLET | Freq: Every day | ORAL | Status: DC
  Administered 2020-06-02 – 2020-06-03 (×2): 100 mg via ORAL
  Filled 2020-06-02 (×2): qty 2

## 2020-06-02 MED ORDER — HYDRALAZINE HCL 50 MG PO TABS
100.0000 mg | ORAL_TABLET | Freq: Three times a day (TID) | ORAL | Status: DC
  Administered 2020-06-02 – 2020-06-03 (×3): 100 mg via ORAL
  Filled 2020-06-02 (×3): qty 2

## 2020-06-02 MED ORDER — CARBAMAZEPINE 200 MG PO TABS
400.0000 mg | ORAL_TABLET | Freq: Every day | ORAL | Status: DC
  Administered 2020-06-02: 400 mg via ORAL
  Filled 2020-06-02 (×2): qty 2

## 2020-06-02 MED ORDER — METOPROLOL TARTRATE 50 MG PO TABS
50.0000 mg | ORAL_TABLET | Freq: Two times a day (BID) | ORAL | Status: DC
  Administered 2020-06-02 – 2020-06-03 (×2): 50 mg via ORAL
  Filled 2020-06-02 (×2): qty 1

## 2020-06-02 MED ORDER — POTASSIUM CHLORIDE CRYS ER 20 MEQ PO TBCR
60.0000 meq | EXTENDED_RELEASE_TABLET | Freq: Two times a day (BID) | ORAL | Status: AC
  Administered 2020-06-02 (×2): 60 meq via ORAL
  Filled 2020-06-02 (×2): qty 3

## 2020-06-02 MED ORDER — DIPHENHYDRAMINE HCL 25 MG PO CAPS
25.0000 mg | ORAL_CAPSULE | Freq: Every evening | ORAL | Status: DC | PRN
  Administered 2020-06-02: 25 mg via ORAL
  Filled 2020-06-02: qty 1

## 2020-06-02 MED ORDER — SERTRALINE HCL 50 MG PO TABS
100.0000 mg | ORAL_TABLET | Freq: Every day | ORAL | Status: DC
  Administered 2020-06-02 – 2020-06-03 (×2): 100 mg via ORAL
  Filled 2020-06-02 (×2): qty 2

## 2020-06-02 MED ORDER — PANTOPRAZOLE SODIUM 40 MG PO TBEC
40.0000 mg | DELAYED_RELEASE_TABLET | Freq: Every day | ORAL | Status: DC
  Administered 2020-06-02 – 2020-06-03 (×2): 40 mg via ORAL
  Filled 2020-06-02 (×2): qty 1

## 2020-06-02 MED ORDER — ALPRAZOLAM 0.5 MG PO TABS
0.5000 mg | ORAL_TABLET | Freq: Two times a day (BID) | ORAL | Status: DC | PRN
  Administered 2020-06-02 (×2): 0.5 mg via ORAL
  Filled 2020-06-02 (×2): qty 1

## 2020-06-02 MED ORDER — CARBAMAZEPINE 200 MG PO TABS
600.0000 mg | ORAL_TABLET | Freq: Every day | ORAL | Status: DC
  Administered 2020-06-03: 600 mg via ORAL
  Filled 2020-06-02 (×2): qty 3

## 2020-06-02 MED ORDER — LAMOTRIGINE 100 MG PO TABS
300.0000 mg | ORAL_TABLET | Freq: Every day | ORAL | Status: DC
  Administered 2020-06-02: 300 mg via ORAL
  Filled 2020-06-02: qty 3

## 2020-06-02 MED ORDER — TAMSULOSIN HCL 0.4 MG PO CAPS
0.4000 mg | ORAL_CAPSULE | Freq: Every day | ORAL | Status: DC
  Administered 2020-06-02 – 2020-06-03 (×2): 0.4 mg via ORAL
  Filled 2020-06-02 (×2): qty 1

## 2020-06-02 NOTE — Progress Notes (Signed)
[facility] SURGICAL PROGRESS NOTE (cpt [phone])  Hospital Day(s): 5.   Interval History: Patient seen and examined, no acute events or new complaints overnight. He was able to pass clamping trial yesterday and started on CLD. Patient reports he continues to feel improved, denies fever, chills, nausea, emesis, distension, or abdominal pain. Continues to have issues with hypokalemia to 2.9 this morning, but no other significant electrolyte derangements. Renal function remains normal with sCr - 0.62. He has tolerated CLD well and had multiple (5x) BMs in the last 24 hours.   Review of Systems:  Constitutional: denies fever, chills  HEENT: denies cough or congestion  Respiratory: denies any shortness of breath  Cardiovascular: denies chest pain or palpitations  Gastrointestinal: denies abdominal pain, N/V, or diarrhea/and bowel function as per interval history Genitourinary: denies burning with urination or urinary frequency   Vital signs in last 24 hours: [min-max] current  Temp:  [98.2 F (36.8 C)-98.7 F (37.1 C)] 98.4 F (36.9 C) (11/29 0535) Pulse Rate:  [70-85] 70 (11/29 0535) Resp:  [16-20] 19 (11/29 0535) BP: (159-177)/(71-91) 161/90 (11/29 0535) SpO2:  [92 %-93 %] 92 % (11/29 0535)     Height: 5\' 7"  (170.2 cm) Weight: (!) 142.7 kg BMI (Calculated): 49.26   Intake/Output last 2 shifts:  11/28 0701 - 11/29 0700 In: 2649.4 [P.O.:570; I.V.:1929.4; IV Piggyback:149.9] Out: 250 [Urine:200; Emesis/NG output:50]   Physical Exam:  Constitutional: alert, cooperative and no distress  HENT: normocephalic without obvious abnormality  Eyes: PERRL, EOM's grossly intact and symmetric  Respiratory: breathing non-labored at rest  Cardiovascular: regular rate and sinus rhythm  Gastrointestinal: Soft, non-tender, and non-distended, no rebound/guarding Musculoskeletal: UE and LE FROM, no edema or wounds, motor and sensation grossly intact, NT    Labs:  CBC Latest Ref Rng &  Units 05/31/2020 05/29/2020 05/28/2020  WBC 4.0 - 10.5 K/uL 7.2 9.0 9.0  Hemoglobin 13.0 - 17.0 g/dL 13.7 14.3 15.1  Hematocrit 39 - 52 % 40.7 43.6 43.8  Platelets 150 - 400 K/uL 166 197 213   CMP Latest Ref Rng & Units 06/02/2020 06/01/2020 05/31/2020  Glucose 70 - 99 mg/dL 93 94 86  BUN 8 - 23 mg/dL 12 11 14   Creatinine 0.61 - 1.24 mg/dL 0.62 0.58(L) 0.72  Sodium 135 - 145 mmol/L 139 137 138  Potassium 3.5 - 5.1 mmol/L 2.9(L) 3.1(L) 3.4(L)  Chloride 98 - 111 mmol/L 102 101 102  CO2 22 - 32 mmol/L 27 27 25   Calcium 8.9 - 10.3 mg/dL 8.3(L) 8.3(L) 8.3(L)  Total Protein 6.5 - 8.1 g/dL 6.1(L) - 6.4(L)  Total Bilirubin 0.3 - 1.2 mg/dL 0.9 - 1.1  Alkaline Phos 38 - 126 U/L 81 - 93  AST 15 - 41 U/L 47(H) - 28  ALT 0 - 44 U/L 39 - 21     Imaging studies: No new pertinent imaging studies   Assessment/Plan: (ICD-10's: K22.609) 59 y.o. male with clinically improving/resolving small bowel obstruction most certainly secondary to post-surgical adhesions, complicated by pertinent comorbidities including obesity and numerous extensive abdominal surgeries   - Okay to advance diet as tolerated today   - Wean from IVF   - Replete K+; monitor   - Monitor abdominal examination; on-going bowel function             - Pain control prn (minimize narcotics if feasible); antiemetics prn             - Mobilization encouraged              -  Further management per primary service; we will follow     - Discharge Planning: Once tolerating soft diet without issue and K+ repleted, okay to discharge from surgical standpoint, anticipate ready in next 24-48 hours  All of the above findings and recommendations were discussed with the patient, and the medical team, and all of patient's questions were answered to his expressed satisfaction.  -- Edison Simon, PA-C [facility] 06/02/2020, 7:30 AM [phone] M-F: 7am - 4pm

## 2020-06-02 NOTE — Progress Notes (Signed)
Mobility Specialist - Progress Note   06/02/20 1400  Mobility  Activity Ambulated in hall  Level of [place] wheel walker  Distance Ambulated (ft) 250 ft  Mobility Response Tolerated well  Mobility performed by Mobility specialist  $Mobility charge 1 Mobility    Pre-mobility: 81 HR, 95% SpO2   Pt was lying in bed upon arrival utilizing room air. Pt agreed to session. Pt denied any pain, nausea, or fatigue. Pt was independent in all transfers this date, including ambulation. Pt ambulated 250' in hallway with no LOB noted. Pt denied SOB, dizziness, or weakness in extremities. Upon return to room, pt ambulated to bathroom and was instructed to use call bell when ready to get back to bed, pt showed understanding. Overall, pt tolerated session well.    Kathee Delton Mobility Specialist 06/02/20, 3:01 PM

## 2020-06-02 NOTE — Progress Notes (Signed)
PROGRESS NOTE    Peter [name]  YQM:[id number] DOB: 1961/06/29 DOA: 05/28/2020 PCP: Jerrol Banana., MD    Brief Narrative:  59 y.o. obese Caucasian male with a known history of hypertension, bipolar disorder, GERD, sleep apnea and peptic ulcer disease, status post multiple laparotomies including cholecystectomy, appendectomy and ventral herniorrhaphy, who presented to the emergency room with acute onset of recurrent nausea and vomiting with associated generalized abdominal pain, found to have SBO. Pt was admitted with General Surgery following  Assessment & Plan:   Active Problems:   SBO (small bowel obstruction) ([facility])   1.  Small bowel obstruction like secondary to adhesions from previous multiple laparotomies, with elevated lactic acid. -abd imaging personally reviewed. Findings worrisome for mod to high grade SBO with transition point at the level of the mid-anterior peritoneal cavity, likely secondary to enteroperitoneal adhesions -Improved after bowel rest and NG decompression -Now passing flatus and BM -Cont to follow and correct electrolytes, goal K >4 and Mg>2 -Per General Surgery, plan to advance diet as tolerated  2.  Hypokalemia. -Remains low this today -KCl replacement ordered -Recheck bmet in AM  3.  Hypertensive urgency. -Given SBO, oral home bp meds were initially held and patient was continued on scheduled IV ACEI and IV beta blocker -Diet now resumed. Will resume home BP meds  4.  Dyslipidemia. -On statin therapy and Lovaza prior to admit -Resume home meds today  5.  Bipolar disorder. -Initially held home Tegretol and Lamictal as pt was NPO,  -Have resumed home meds   6.  BPH. -Have resumed home flomax  7.  Peptic ulcer disease. -Continue on IV PPI therapy.  8.  DVT prophylaxis. -Subcutaneous Lovenox.  9. OSA -Will continue CPAP as tolerated  10. Acute hypoxemic respiratory failure -Pt is baseline O2 naive, currently requiring  5LNC -CXR ordered and reviewed, no acute process noted -Given recent n/v, suspecting aspiration PNA vs pneumonitis -Pt has completed 5 days of zosyn  DVT prophylaxis: Lovenox subq Code Status: Full Family Communication: Pt in room, family is currently not at bedside  Status is: Inpatient  Remains inpatient appropriate because:IV treatments appropriate due to intensity of illness or inability to take PO and Inpatient level of care appropriate due to severity of illness   Dispo: The patient is from: Home              Anticipated d/c is to: Home              Anticipated d/c date is: > 3 days              Patient currently is not medically stable to d/c.    Consultants:   General Surgery  IR  Procedures:     Antimicrobials: Anti-infectives (From admission, onward)   Start     Dose/Rate Route Frequency Ordered Stop   05/30/20 1445  piperacillin-tazobactam (ZOSYN) IVPB 3.375 g        3.375 g 12.5 mL/hr over 240 Minutes Intravenous Every 8 hours 05/30/20 1351 06/02/20 1412   05/28/20 1700  piperacillin-tazobactam (ZOSYN) IVPB 3.375 g  Status:  Discontinued        3.375 g 12.5 mL/hr over 240 Minutes Intravenous Every 8 hours 05/28/20 1541 05/30/20 1351   05/28/20 0600  ceFEPIme (MAXIPIME) 2 g in sodium chloride 0.9 % 100 mL IVPB  Status:  Discontinued        2 g 200 mL/hr over 30 Minutes Intravenous Every 8 hours 05/28/20 0430 05/28/20 5284  05/28/20 0600  meropenem (MERREM) 1 g in sodium chloride 0.9 % 100 mL IVPB  Status:  Discontinued        1 g 200 mL/hr over 30 Minutes Intravenous Every 8 hours 05/28/20 0430 05/28/20 0449   05/28/20 0315  meropenem (MERREM) 1 g in sodium chloride 0.9 % 100 mL IVPB  Status:  Discontinued        1 g 200 mL/hr over 30 Minutes Intravenous Every 8 hours 05/28/20 0235 05/28/20 1541   05/28/20 0245  ceFEPIme (MAXIPIME) 2 g in sodium chloride 0.9 % 100 mL IVPB  Status:  Discontinued        2 g 200 mL/hr over 30 Minutes Intravenous  Once  05/28/20 0235 05/28/20 0236      Subjective: Reports feeling better  Objective: Vitals:   06/01/20 2356 06/02/20 0535 06/02/20 0813 06/02/20 1103  BP: (!) 159/85 (!) 161/90 (!) 161/87 (!) 160/95  Pulse: 85 70 63 75  Resp: 16 19 20 20   Temp: 98.7 F (37.1 C) 98.4 F (36.9 C) 98 F (36.7 C) 97.9 F (36.6 C)  TempSrc: Oral Oral Oral Oral  SpO2: 92% 92% 94% 94%  Weight:      Height:        Intake/Output Summary (Last 24 hours) at 06/02/2020 1355 Last data filed at 06/02/2020 1300 Gross per 24 hour  Intake 1967.95 ml  Output 600 ml  Net 1367.95 ml   Filed Weights   05/27/20 2222 05/28/20 0402  Weight: 136.1 kg (!) 142.7 kg    Examination: General exam: Awake, laying in bed, in nad Respiratory system: Normal respiratory effort, no wheezing Cardiovascular system: regular rate, s1, s2 Gastrointestinal system: Soft, nondistended, positive BS Central nervous system: CN2-12 grossly intact, strength intact Extremities: Perfused, no clubbing Skin: Normal skin turgor, no notable skin lesions seen Psychiatry: Mood normal // no visual hallucinations    Data Reviewed: I have personally reviewed following labs and imaging studies  CBC: Recent Labs  Lab 05/27/20 2225 05/28/20 0446 05/29/20 0451 05/31/20 0600  WBC 12.0* 9.0 9.0 7.2  HGB 15.6 15.1 14.3 13.7  HCT 46.4 43.8 43.6 40.7  MCV 85.9 87.1 88.6 86.8  PLT 227 213 197 601   Basic Metabolic Panel: Recent Labs  Lab 05/28/20 0446 05/28/20 0446 05/29/20 0451 05/30/20 0504 05/31/20 0600 06/01/20 0618 06/02/20 0331  NA 140   < > 141 142 138 137 139  K 3.0*   < > 2.8* 3.0* 3.4* 3.1* 2.9*  CL 101   < > 103 106 102 101 102  CO2 28   < > 29 28 25 27 27   GLUCOSE 118*   < > 108* 77 86 94 93  BUN 15   < > 23 20 14 11 12   CREATININE 0.79   < > 0.78 0.71 0.72 0.58* 0.62  CALCIUM 8.8*   < > 8.4* 8.1* 8.3* 8.3* 8.3*  MG 2.0  --  2.2  --  2.1 2.0 2.1   < > = values in this interval not displayed.   GFR: Estimated  Creatinine Clearance: 132.6 mL/min (by C-G formula based on SCr of 0.62 mg/dL). Liver Function Tests: Recent Labs  Lab 05/27/20 2225 05/29/20 0451 05/30/20 0504 05/31/20 0600 06/02/20 0331  AST 28 21 22 28  47*  ALT 20 16 17 21  39  ALKPHOS 126 98 88 93 81  BILITOT 1.0 0.8 0.9 1.1 0.9  PROT 8.0 6.7 6.1* 6.4* 6.1*  ALBUMIN 4.2 3.2* 2.9* 3.0* 3.0*  Recent Labs  Lab 05/27/20 2225  LIPASE 44   No results for input(s): AMMONIA in the last 168 hours. Coagulation Profile: Recent Labs  Lab 05/28/20 0446  INR 1.0   Cardiac Enzymes: No results for input(s): CKTOTAL, CKMB, CKMBINDEX, TROPONINI in the last 168 hours. BNP (last 3 results) No results for input(s): PROBNP in the last 8760 hours. HbA1C: No results for input(s): HGBA1C in the last 72 hours. CBG: No results for input(s): GLUCAP in the last 168 hours. Lipid Profile: No results for input(s): CHOL, HDL, LDLCALC, TRIG, CHOLHDL, LDLDIRECT in the last 72 hours. Thyroid Function Tests: No results for input(s): TSH, T4TOTAL, FREET4, T3FREE, THYROIDAB in the last 72 hours. Anemia Panel: No results for input(s): VITAMINB12, FOLATE, FERRITIN, TIBC, IRON, RETICCTPCT in the last 72 hours. Sepsis Labs: Recent Labs  Lab 05/28/20 0124 05/28/20 0248 05/28/20 0446  PROCALCITON  --   --  <0.10  LATICACIDVEN 3.1* 1.7  --     Recent Results (from the past 240 hour(s))  Resp Panel by RT-PCR (Flu A&B, Covid) Nasopharyngeal Swab     Status: None   Collection Time: 05/28/20  1:24 AM   Specimen: Nasopharyngeal Swab; Nasopharyngeal(NP) swabs in vial transport medium  Result Value Ref Range Status   SARS Coronavirus 2 by RT PCR NEGATIVE NEGATIVE Final    Comment: (NOTE) SARS-CoV-2 target nucleic acids are NOT DETECTED.  The SARS-CoV-2 RNA is generally detectable in upper respiratory specimens during the acute phase of infection. The lowest concentration of SARS-CoV-2 viral copies this assay can detect is 138 copies/mL. A negative  result does not preclude SARS-Cov-2 infection and should not be used as the sole basis for treatment or other patient management decisions. A negative result may occur with  improper specimen collection/handling, submission of specimen other than nasopharyngeal swab, presence of viral mutation(s) within the areas targeted by this assay, and inadequate number of viral copies(<138 copies/mL). A negative result must be combined with clinical observations, patient history, and epidemiological information. The expected result is Negative.  Fact Sheet for Patients:  EntrepreneurPulse.com.au  Fact Sheet for Healthcare Providers:  IncredibleEmployment.be  This test is no t yet approved or cleared by the [place] FDA and  has been authorized for detection and/or diagnosis of SARS-CoV-2 by FDA under an Emergency Use Authorization (EUA). This EUA will remain  in effect (meaning this test can be used) for the duration of the COVID-19 declaration under Section 564(b)(1) of the Act, 21 U.S.C.section 360bbb-3(b)(1), unless the authorization is terminated  or revoked sooner.       Influenza A by PCR NEGATIVE NEGATIVE Final   Influenza B by PCR NEGATIVE NEGATIVE Final    Comment: (NOTE) The Xpert Xpress SARS-CoV-2/FLU/RSV plus assay is intended as an aid in the diagnosis of influenza from Nasopharyngeal swab specimens and should not be used as a sole basis for treatment. Nasal washings and aspirates are unacceptable for Xpert Xpress SARS-CoV-2/FLU/RSV testing.  Fact Sheet for Patients: EntrepreneurPulse.com.au  Fact Sheet for Healthcare Providers: IncredibleEmployment.be  This test is not yet approved or cleared by the [place] FDA and has been authorized for detection and/or diagnosis of SARS-CoV-2 by FDA under an Emergency Use Authorization (EUA). This EUA will remain in effect (meaning this test can be used)  for the duration of the COVID-19 declaration under Section 564(b)(1) of the Act, 21 U.S.C. section 360bbb-3(b)(1), unless the authorization is terminated or revoked.  Performed at [facility], [address] [place], [place] [address]  Culture, blood (single)     Status: None   Collection Time: 05/28/20  2:48 AM   Specimen: BLOOD  Result Value Ref Range Status   Specimen Description BLOOD  Final   Special Requests NONE  Final   Culture   Final    NO GROWTH 5 DAYS Performed at [facility], [address], [place], [place] [address]    Report Status 06/02/2020 FINAL  Final     Radiology Studies: DG Abd 2 Views  Result Date: 05/31/2020 CLINICAL DATA:  Active vomiting. EXAM: ABDOMEN - 2 VIEW COMPARISON:  None. FINDINGS: Nonobstructive bowel gas pattern. There is no evidence of free air. No radio-opaque calculi or other significant radiographic abnormality is seen. Enteric catheter overlies the expected location of proximal gastric body. IMPRESSION: Nonobstructive bowel gas pattern. Electronically Signed   By: Fidela Salisbury M.D.   On: 05/31/2020 19:11    Scheduled Meds: . atorvastatin  40 mg Oral q1800  . azelastine  2 spray Each Nare BID  . carbamazepine  400 mg Oral QHS  . carbamazepine  600 mg Oral Daily  . enoxaparin (LOVENOX) injection  70 mg Subcutaneous Q24H  . fluticasone  2 spray Each Nare Daily  . hydrALAZINE  100 mg Oral TID  . lamoTRIgine  300 mg Oral QHS  . latanoprost  1 drop Both Eyes QHS  . losartan  100 mg Oral Daily  . metoprolol tartrate  50 mg Oral BID  . montelukast  10 mg Oral QHS  . pantoprazole  40 mg Oral Daily  . potassium chloride  60 mEq Oral BID  . sertraline  100 mg Oral Daily  . spironolactone  25 mg Oral Daily  . tamsulosin  0.4 mg Oral Daily   Continuous Infusions: . sodium chloride 100 mL/hr at 06/02/20 0400  . piperacillin-tazobactam (ZOSYN)  IV 3.375 g (06/02/20 1012)     LOS: 5 days   Marylu Lund,  MD [facility] Pager On Amion  If 7PM-7AM, please contact night-coverage 06/02/2020, 1:55 PM

## 2020-06-02 NOTE — Progress Notes (Addendum)
Patient complained of nasal dripping and itching, requested this RN to ask for Benadryl to be added to his PRN medication.  RN conveyed request to covering NP, Rufina Falco.  Awaiting directives and or order. Order received.

## 2020-06-02 NOTE — Evaluation (Signed)
Physical Therapy Evaluation Patient Details Name: Peter [name] MRN: [id number] DOB: 05-27-1961 Today's Date: 06/02/2020   History of Present Illness  Pt admitted for SBO with complaints of abdominal pain and vomiting. Inital NG tube placement, now removed. History includes HTN, bipolar, GERD, peptic ulcer dx, and is s/p multiple laparotomies.  Clinical Impression  Pt is a pleasant 59 year old male who was admitted for SBO. Pt performs bed mobility/transfers with independence and ambulation with mod I with BRW due to patient comfort. Pt demonstrates all bed mobility/transfers/ambulation at baseline level. Reinforced to continue to use BRW at this time, but then should wean off RW as able. Also encouraged to continue ambulation. Pt does not require any further PT needs at this time. Pt will be dc in house and does not require follow up. RN aware. Will dc current orders.     Follow Up Recommendations No PT follow up    Equipment Recommendations  None recommended by PT    Recommendations for Other Services       Precautions / Restrictions Precautions Precautions: None Restrictions Weight Bearing Restrictions: No      Mobility  Bed Mobility Overal bed mobility: Independent             General bed mobility comments: safe technique    Transfers Overall transfer level: Independent Equipment used: None             General transfer comment: upright posture. Able to stand from low surface. Once standing, uses BRW  Ambulation/Gait Ambulation/Gait assistance: Modified independent (Device/Increase time) Gait Distance (Feet): 330 Feet Assistive device:  (BRW) Gait Pattern/deviations: Step-through pattern     General Gait Details: ambulated 2 laps in hallway with safe technique. Reciprocal gait pattern performed and no SOB symptoms. Recommend continued use of BRW at this time  Financial trader Rankin (Stroke Patients Only)        Balance Overall balance assessment: Independent                                           Pertinent Vitals/Pain Pain Assessment: No/denies pain    Home Living Family/patient expects to be discharged to:: Private residence Living Arrangements: Spouse/significant other Available Help at Discharge: Family Type of Home: House Home Access: Stairs to enter Entrance Stairs-Rails: Can reach both Entrance Stairs-Number of Steps: 3 Home Layout: One level Home Equipment: Environmental consultant - 2 wheels (BRW)      Prior Function Level of Independence: Independent         Comments: uses BRW s/p Surgeries, however usually very indep     Hand Dominance        Extremity/Trunk Assessment   Upper Extremity Assessment Upper Extremity Assessment: Overall WFL for tasks assessed    Lower Extremity Assessment Lower Extremity Assessment: Overall WFL for tasks assessed       Communication   Communication: No difficulties  Cognition Arousal/Alertness: Awake/alert Behavior During Therapy: WFL for tasks assessed/performed Overall Cognitive Status: Within Functional Limits for tasks assessed                                        General Comments      Exercises  Assessment/Plan    PT Assessment Patent does not need any further PT services  PT Problem List         PT Treatment Interventions      PT Goals (Current goals can be found in the Care Plan section)  Acute Rehab PT Goals Patient Stated Goal: to go home PT Goal Formulation: All assessment and education complete, DC therapy Time For Goal Achievement: 06/02/20 Potential to Achieve Goals: Good    Frequency     Barriers to discharge        Co-evaluation               AM-PAC PT "6 Clicks" Mobility  Outcome Measure Help needed turning from your back to your side while in a flat bed without using bedrails?: None Help needed moving from lying on your back to sitting on the  side of a flat bed without using bedrails?: None Help needed moving to and from a bed to a chair (including a wheelchair)?: None Help needed standing up from a chair using your arms (e.g., wheelchair or bedside chair)?: None Help needed to walk in hospital room?: None Help needed climbing 3-5 steps with a railing? : None 6 Click Score: 24    End of Session   Activity Tolerance: Patient tolerated treatment well Patient left: in chair Nurse Communication: Mobility status PT Visit Diagnosis: Muscle weakness (generalized) (M62.81)    Time: 0355-9741 PT Time Calculation (min) (ACUTE ONLY): 17 min   Charges:   PT Evaluation $PT Eval Low Complexity: 1 Low PT Treatments $Gait Training: 8-22 mins        Peter [name], PT, DPT [phone]   Peter [name] 06/02/2020, 12:47 PM

## 2020-06-02 NOTE — Plan of Care (Signed)
Patient has tolerated full liquid diet well without any nausea, vomiting nor pain.  Patient advanced to soft diet.  Patient ambulated in hallway, tolerating well.  Will continue to monitor.

## 2020-06-03 DIAGNOSIS — K56601 Complete intestinal obstruction, unspecified as to cause: Secondary | ICD-10-CM | POA: Diagnosis not present

## 2020-06-03 DIAGNOSIS — R778 Other specified abnormalities of plasma proteins: Secondary | ICD-10-CM | POA: Diagnosis not present

## 2020-06-03 LAB — COMPREHENSIVE METABOLIC PANEL
ALT: 44 U/L (ref 0–44)
AST: 47 U/L — ABNORMAL HIGH (ref 15–41)
Albumin: 3 g/dL — ABNORMAL LOW (ref 3.5–5.0)
Alkaline Phosphatase: 80 U/L (ref 38–126)
Anion gap: 9 (ref 5–15)
BUN: 12 mg/dL (ref 8–23)
CO2: 28 mmol/L (ref 22–32)
Calcium: 8.6 mg/dL — ABNORMAL LOW (ref 8.9–10.3)
Chloride: 104 mmol/L (ref 98–111)
Creatinine, Ser: 0.76 mg/dL (ref 0.61–1.24)
GFR, Estimated: >60 mL/min (ref >60)
Glucose, Bld: 108 mg/dL — ABNORMAL HIGH (ref 70–99)
Potassium: 3.8 mmol/L (ref 3.5–5.1)
Sodium: 141 mmol/L (ref 135–145)
Total Bilirubin: 0.7 mg/dL (ref 0.3–1.2)
Total Protein: 6.2 g/dL — ABNORMAL LOW (ref 6.5–8.1)

## 2020-06-03 LAB — MAGNESIUM: Magnesium: 2.2 mg/dL (ref 1.7–2.4)

## 2020-06-03 MED ORDER — POTASSIUM CHLORIDE CRYS ER 20 MEQ PO TBCR
40.0000 meq | EXTENDED_RELEASE_TABLET | Freq: Once | ORAL | Status: AC
  Administered 2020-06-03: 40 meq via ORAL
  Filled 2020-06-03: qty 2

## 2020-06-03 MED ORDER — POLYVINYL ALCOHOL 1.4 % OP SOLN
2.0000 [drp] | OPHTHALMIC | Status: DC | PRN
  Filled 2020-06-03: qty 15

## 2020-06-03 NOTE — Discharge Summary (Addendum)
Physician Discharge Summary  Peter [name]:[id number] DOB: 1961/03/15 DOA: 05/28/2020  PCP: Maple Hudson., MD  Admit date: 05/28/2020 Discharge date: 06/03/2020  Admitted From: Home Disposition:  Home  Recommendations for Outpatient Follow-up:  1. Follow up with PCP in 1-2 week  Discharge Condition:Improved CODE STATUS:Full Diet recommendation: Soft   Brief/Interim Summary: 59 y.o.obese Caucasian malewith a known history of hypertension, bipolar disorder, GERD, sleep apnea and peptic ulcer disease, status post multiple laparotomies including cholecystectomy, appendectomy and ventral herniorrhaphy, who presented to the emergency room with acute onset of recurrent nausea and vomiting with associated generalized abdominal pain, found to have SBO. Pt was admitted with General Surgery following  Discharge Diagnoses:  Active Problems:   SBO (small bowel obstruction) (HCC)  1. Small bowel obstruction like secondary to adhesions from previous multiple laparotomies, with elevated lactic acid. -abd imaging personally reviewed. Findings worrisome for mod to high grade SBO with transition point at the level of the mid-anterior peritoneal cavity, likely secondary to enteroperitoneal adhesions -Improved after bowel rest and NG decompression -Now passing flatus and BM -Successfully advanced diet to soft  2. Hypokalemia. -Corrected  3. Hypertensive urgency. -Given SBO, oral home bp meds were initially held and patient was continued on scheduled IV ACEI and IV beta blocker -Diet now resumed. Home BP meds have been resumed  4. Dyslipidemia. -On statin therapy and Lovaza prior to admit -Resumed home meds  5. Bipolar disorder. -Initially held home Tegretol and Lamictal as pt was NPO,  -Have resumed home meds   6. BPH. -Have resumed home flomax  7. Peptic ulcer disease. -Continue on PPI  8. DVT prophylaxis. -Subcutaneous Lovenox.  9. OSA -Will  continue CPAP as tolerated  10. Acute hypoxemic respiratory failure -Pt is baseline O2 naive, currently requiring 5LNC -CXR ordered and reviewed, no acute process noted -Given recent n/v, suspecting aspiration PNA vs pneumonitis -Pt has completed 5 days of zosyn  Sepsis ruled out Discharge Instructions   Allergies as of 06/03/2020      Reactions   Oxycodone Other (See Comments)   Causes Hallucinations, auditory and tactile   Isosorbide Nausea And Vomiting   Headache, nausea, vomiting      Medication List    TAKE these medications   ALPRAZolam 0.5 MG tablet Commonly known as: XANAX TAKE 1 TABLET(0.5 MG) BY MOUTH TWICE DAILY AS NEEDED FOR ANXIETY   amLODipine 10 MG tablet Commonly known as: NORVASC TAKE 1 TABLET(10 MG) BY MOUTH DAILY   aspirin EC 81 MG tablet Take 81 mg by mouth daily.   atorvastatin 40 MG tablet Commonly known as: LIPITOR TAKE 1 TABLET DAILY   azelastine 0.1 % nasal spray Commonly known as: ASTELIN SMARTSIG:1-2 Spray(s) Both Nares Twice Daily   b complex vitamins capsule Take 1 capsule by mouth daily.   carbamazepine 200 MG tablet Commonly known as: TEGRETOL TAKE 3 TABLETS BY MOUTH EVERY MORNING AND 2 TABLETS BY MOUTH EVERY NIGHT AT BEDTIME   Fish Oil 1000 MG Caps Take 1 capsule by mouth daily.   fluticasone 50 MCG/ACT nasal spray Commonly known as: FLONASE SMARTSIG:1-2 Spray(s) Both Nares Daily   hydrALAZINE 100 MG tablet Commonly known as: APRESOLINE TAKE 1 TABLET THREE TIMES A DAY   Inspra 50 MG tablet Generic drug: eplerenone Take 50 mg by mouth daily.   lamoTRIgine 200 MG tablet Commonly known as: LAMICTAL TAKE 1 AND 1/2 TABLETS(300 MG) BY MOUTH AT BEDTIME   latanoprost 0.005 % ophthalmic solution Commonly known as: Office manager  1 drop into both eyes at bedtime.   losartan 100 MG tablet Commonly known as: COZAAR TAKE 1 TABLET DAILY   metoprolol tartrate 50 MG tablet Commonly known as: LOPRESSOR TAKE 1 TABLET TWICE A  DAY   montelukast 10 MG tablet Commonly known as: SINGULAIR Take 10 mg by mouth at bedtime.   Narcan 4 MG/0.1ML Liqd nasal spray kit Generic drug: naloxone USE 1 SPRAY IEN UTD PRN   Nat-Rul Vitamin D 25 MCG (1000 UT) tablet Generic drug: Cholecalciferol Take 1,000 Units by mouth daily.   NON FORMULARY CPAP   omeprazole 40 MG capsule Commonly known as: PRILOSEC Take 1 capsule (40 mg total) by mouth daily before breakfast.   sertraline 100 MG tablet Commonly known as: ZOLOFT TAKE 1 TABLET DAILY   tamsulosin 0.4 MG Caps capsule Commonly known as: FLOMAX Take 1 capsule (0.4 mg total) by mouth daily.   UNABLE TO FIND 900 mg by Per post-pyloric tube route. Med Name: Kyolic   vitamin Z-61 0960 MCG tablet Commonly known as: CYANOCOBALAMIN Take 1,000 mcg by mouth daily.       Follow-up Information    Jerrol Banana., MD. Schedule an appointment as soon as possible for a visit in 1 week(s).   Specialty: Family Medicine Contact information: [address] [place] [address] [phone]              Allergies  Allergen Reactions  . Oxycodone Other (See Comments)    Causes Hallucinations, auditory and tactile  . Isosorbide Nausea And Vomiting    Headache, nausea, vomiting    Consultations:  General Surgery  Procedures/Studies: DG Abd 1 View  Result Date: 05/31/2020 CLINICAL DATA:  59 year old male with small bowel obstruction. EXAM: ABDOMEN - 1 VIEW COMPARISON:  05/28/2020 FINDINGS: Partially visualized gastric decompression tube, unable to confidently described position based on incomplete capturing of the upper abdomen on these radiographs. The bowel gas pattern is normal. Irregular appearing coarse calcifications project of the left abdomen, compatible with findings on recent CT. IMPRESSION: Nonobstructive bowel gas pattern. Incomplete evaluation of the indwelling gastric decompression tube, with the tip in the left upper quadrant.  Electronically Signed   By: Ruthann Cancer MD   On: 05/31/2020 10:50   DG Abd 1 View  Result Date: 05/28/2020 CLINICAL DATA:  Nasogastric tube placement. EXAM: ABDOMEN - 1 VIEW COMPARISON:  None. FINDINGS: The bowel gas pattern is normal. Nasogastric tube tip is seen in proximal stomach. No radio-opaque calculi or other significant radiographic abnormality are seen. IMPRESSION: Nasogastric tube tip seen in proximal stomach. Electronically Signed   By: Marijo Conception M.D.   On: 05/28/2020 13:41   DG Chest Port 1 View  Result Date: 05/29/2020 CLINICAL DATA:  Possible aspiration EXAM: PORTABLE CHEST 1 VIEW COMPARISON:  Nov 28, 2016 FINDINGS: The cardiomediastinal silhouette is unchanged and enlarged in contour.Unchanged elevation of the RIGHT hemidiaphragm. Low lung volumes. The enteric tube courses through the chest to the abdomen beyond the field-of-view. No pleural effusion. No pneumothorax. Scattered linear opacities are most consistent with atelectasis. Bronchovascular crowding. Visualized abdomen is unremarkable. Multilevel degenerative changes of the thoracic spine. IMPRESSION: 1. Low lung volumes with scattered atelectasis and bronchovascular crowding. No definite consolidation. Electronically Signed   By: Valentino Saxon MD   On: 05/29/2020 11:07   DG Abd 2 Views  Result Date: 05/31/2020 CLINICAL DATA:  Active vomiting. EXAM: ABDOMEN - 2 VIEW COMPARISON:  None. FINDINGS: Nonobstructive bowel gas pattern. There is no  evidence of free air. No radio-opaque calculi or other significant radiographic abnormality is seen. Enteric catheter overlies the expected location of proximal gastric body. IMPRESSION: Nonobstructive bowel gas pattern. Electronically Signed   By: Fidela Salisbury M.D.   On: 05/31/2020 19:11   DG Naso G Tube Plc W/Fl W/Rad  Result Date: 05/28/2020 INDICATION: Small-bowel obstruction EXAM: NASO G TUBE PLACEMENT WITH FL AND WITH RAD COMPARISON:  None. CONTRAST:  None  FLUOROSCOPY TIME:  1 minutes. Fifty-four seconds. COMPLICATIONS: None immediate PROCEDURE: The nasogastric catheter was lubricated with viscous lidocaine inserted into the right nostril. Under intermittent fluoroscopic guidance, attempts were made to pass the gastric catheter into the esophagus which were unsuccessful. Following several attempts the patient requested to terminate the procedure. IMPRESSION: Unsuccessful placement of nasogastric catheter as described. Electronically Signed   By: Inez Catalina M.D.   On: 05/28/2020 10:24   CT Angio Chest/Abd/Pel for Dissection W and/or W/WO  Result Date: 05/28/2020 CLINICAL DATA:  Abdominal pain.  Concern for aortic dissection. EXAM: CT ANGIOGRAPHY CHEST, ABDOMEN AND PELVIS TECHNIQUE: Non-contrast CT of the chest was initially obtained. Multidetector CT imaging through the chest, abdomen and pelvis was performed using the standard protocol during bolus administration of intravenous contrast. Multiplanar reconstructed images and MIPs were obtained and reviewed to evaluate the vascular anatomy. CONTRAST:  141mL OMNIPAQUE IOHEXOL 350 MG/ML SOLN COMPARISON:  CT dated June 02, 2017. FINDINGS: CTA CHEST FINDINGS Cardiovascular: The heart size is enlarged. There is no evidence for thoracic aortic dissection. No thoracic aortic aneurysm. There is no significant pericardial effusion. There is no large centrally located pulmonary embolism. Mediastinum/Nodes: -- No mediastinal lymphadenopathy. -- No hilar lymphadenopathy. -- No axillary lymphadenopathy. -- No supraclavicular lymphadenopathy. -- Normal thyroid gland where visualized. -  Unremarkable esophagus. Lungs/Pleura: Multiple small 4-5 mm pulmonary nodules are noted in the right upper lobe. There is atelectasis at the lung bases. Musculoskeletal: No chest wall abnormality. No bony spinal canal stenosis. Review of the MIP images confirms the above findings. CTA ABDOMEN AND PELVIS FINDINGS VASCULAR Aorta: Normal  caliber aorta without aneurysm, dissection, vasculitis or significant stenosis. Celiac: Patent without evidence of aneurysm, dissection, vasculitis or significant stenosis. SMA: Patent without evidence of aneurysm, dissection, vasculitis or significant stenosis. Renals: Both renal arteries are patent without evidence of aneurysm, dissection, vasculitis, fibromuscular dysplasia or significant stenosis. IMA: Patent without evidence of aneurysm, dissection, vasculitis or significant stenosis. Inflow: Patent without evidence of aneurysm, dissection, vasculitis or significant stenosis. Veins: No obvious venous abnormality within the limitations of this arterial phase study. Review of the MIP images confirms the above findings. NON-VASCULAR Hepatobiliary: The liver is normal. Status post cholecystectomy.There is no biliary ductal dilation. Pancreas: Normal contours without ductal dilatation. No peripancreatic fluid collection. Spleen: Unremarkable. Adrenals/Urinary Tract: --Adrenal glands: Unremarkable. --Right kidney/ureter: No hydronephrosis or radiopaque kidney stones. --Left kidney/ureter: No hydronephrosis or radiopaque kidney stones. --Urinary bladder: Unremarkable. Stomach/Bowel: --Stomach/Duodenum: The stomach is distended. --Small bowel: There is evidence for a moderate to high-grade small bowel obstruction with a transition point at the level of the mid anterior peritoneal cavity. This is likely secondary to enteroperitoneal adhesions. --Colon: Unremarkable. --Appendix: Surgically absent. Vascular/Lymphatic: Normal course and caliber of the major abdominal vessels. --No retroperitoneal lymphadenopathy. --No mesenteric lymphadenopathy. --No pelvic or inguinal lymphadenopathy. Reproductive: Unremarkable Other: No ascites or free air. Again noted are coarse calcifications involving the anterior abdominal wall. Musculoskeletal. No acute displaced fractures. Review of the MIP images confirms the above findings.  IMPRESSION: 1. No acute vascular abnormality. 2. There  is evidence for a moderate to high-grade small bowel obstruction with a transition point at the level of the mid anterior peritoneal cavity. This is likely secondary to enteroperitoneal adhesions. 3. Cardiomegaly. 4. Multiple small 4-5 mm pulmonary nodules in the right upper lobe. No follow-up needed if patient is low-risk (and has no known or suspected primary neoplasm). Non-contrast chest CT can be considered in 12 months if patient is high-risk. This recommendation follows the consensus statement: Guidelines for Management of Incidental Pulmonary Nodules Detected on CT Images: From the Fleischner Society 2017; Radiology 2017; 284:228-243. Electronically Signed   By: Constance Holster M.D.   On: 05/28/2020 02:14     Subjective: Eager to go home today  Discharge Exam: Vitals:   06/03/20 0843 06/03/20 1133  BP: (!) 182/98 (!) 147/70  Pulse: 71 70  Resp: 20   Temp: (!) 97.4 F (36.3 C) 98.3 F (36.8 C)  SpO2: 95% 94%   Vitals:   06/02/20 2323 06/03/20 0457 06/03/20 0843 06/03/20 1133  BP: (!) 155/86 (!) 164/82 (!) 182/98 (!) 147/70  Pulse:  68 71 70  Resp: 18 (!) 22 20   Temp: 98 F (36.7 C) 97.7 F (36.5 C) (!) 97.4 F (36.3 C) 98.3 F (36.8 C)  TempSrc: Oral   Oral  SpO2: 94% 94% 95% 94%  Weight:      Height:        General: Pt is alert, awake, not in acute distress Cardiovascular: RRR, S1/S2 +, no rubs, no gallops Respiratory: CTA bilaterally, no wheezing, no rhonchi Abdominal: Soft, NT, ND, bowel sounds + Extremities: no edema, no cyanosis   The results of significant diagnostics from this hospitalization (including imaging, microbiology, ancillary and laboratory) are listed below for reference.     Microbiology: Recent Results (from the past 240 hour(s))  Resp Panel by RT-PCR (Flu A&B, Covid) Nasopharyngeal Swab     Status: None   Collection Time: 05/28/20  1:24 AM   Specimen: Nasopharyngeal Swab;  Nasopharyngeal(NP) swabs in vial transport medium  Result Value Ref Range Status   SARS Coronavirus 2 by RT PCR NEGATIVE NEGATIVE Final    Comment: (NOTE) SARS-CoV-2 target nucleic acids are NOT DETECTED.  The SARS-CoV-2 RNA is generally detectable in upper respiratory specimens during the acute phase of infection. The lowest concentration of SARS-CoV-2 viral copies this assay can detect is 138 copies/mL. A negative result does not preclude SARS-Cov-2 infection and should not be used as the sole basis for treatment or other patient management decisions. A negative result may occur with  improper specimen collection/handling, submission of specimen other than nasopharyngeal swab, presence of viral mutation(s) within the areas targeted by this assay, and inadequate number of viral copies(<138 copies/mL). A negative result must be combined with clinical observations, patient history, and epidemiological information. The expected result is Negative.  Fact Sheet for Patients:  EntrepreneurPulse.com.au  Fact Sheet for Healthcare Providers:  IncredibleEmployment.be  This test is no t yet approved or cleared by the [place] FDA and  has been authorized for detection and/or diagnosis of SARS-CoV-2 by FDA under an Emergency Use Authorization (EUA). This EUA will remain  in effect (meaning this test can be used) for the duration of the COVID-19 declaration under Section 564(b)(1) of the Act, 21 U.S.C.section 360bbb-3(b)(1), unless the authorization is terminated  or revoked sooner.       Influenza A by PCR NEGATIVE NEGATIVE Final   Influenza B by PCR NEGATIVE NEGATIVE Final    Comment: (NOTE) The  Xpert Xpress SARS-CoV-2/FLU/RSV plus assay is intended as an aid in the diagnosis of influenza from Nasopharyngeal swab specimens and should not be used as a sole basis for treatment. Nasal washings and aspirates are unacceptable for Xpert Xpress  SARS-CoV-2/FLU/RSV testing.  Fact Sheet for Patients: EntrepreneurPulse.com.au  Fact Sheet for Healthcare Providers: IncredibleEmployment.be  This test is not yet approved or cleared by the [place] FDA and has been authorized for detection and/or diagnosis of SARS-CoV-2 by FDA under an Emergency Use Authorization (EUA). This EUA will remain in effect (meaning this test can be used) for the duration of the COVID-19 declaration under Section 564(b)(1) of the Act, 21 U.S.C. section 360bbb-3(b)(1), unless the authorization is terminated or revoked.  Performed at [facility], [address] [place], [place] [address]   Culture, blood (single)     Status: None   Collection Time: 05/28/20  2:48 AM   Specimen: BLOOD  Result Value Ref Range Status   Specimen Description BLOOD  Final   Special Requests NONE  Final   Culture   Final    NO GROWTH 5 DAYS Performed at [facility], [address], [place], [place] [address]    Report Status 06/02/2020 FINAL  Final     Labs: BNP (last 3 results) No results for input(s): BNP in the last 8760 hours. Basic Metabolic Panel: Recent Labs  Lab 05/29/20 0451 05/29/20 0451 05/30/20 0504 05/31/20 0600 06/01/20 0618 06/02/20 0331 06/03/20 0614  NA 141   < > 142 138 137 139 141  K 2.8*   < > 3.0* 3.4* 3.1* 2.9* 3.8  CL 103   < > 106 102 101 102 104  CO2 29   < > $R'28 25 27 27 28  'Xo$ GLUCOSE 108*   < > 77 86 94 93 108*  BUN 23   < > $R'20 14 11 12 12  'yp$ CREATININE 0.78   < > 0.71 0.72 0.58* 0.62 0.76  CALCIUM 8.4*   < > 8.1* 8.3* 8.3* 8.3* 8.6*  MG 2.2  --   --  2.1 2.0 2.1 2.2   < > = values in this interval not displayed.   Liver Function Tests: Recent Labs  Lab 05/29/20 0451 05/30/20 0504 05/31/20 0600 06/02/20 0331 06/03/20 0614  AST $Re'21 22 28 'Tcu$ 47* 47*  ALT $Re'16 17 21 'Jhw$ 39 44  ALKPHOS 98 88 93 81 80  BILITOT 0.8 0.9 1.1 0.9 0.7  PROT 6.7 6.1* 6.4* 6.1* 6.2*  ALBUMIN 3.2*  2.9* 3.0* 3.0* 3.0*   Recent Labs  Lab 05/27/20 2225  LIPASE 44   No results for input(s): AMMONIA in the last 168 hours. CBC: Recent Labs  Lab 05/27/20 2225 05/28/20 0446 05/29/20 0451 05/31/20 0600  WBC 12.0* 9.0 9.0 7.2  HGB 15.6 15.1 14.3 13.7  HCT 46.4 43.8 43.6 40.7  MCV 85.9 87.1 88.6 86.8  PLT 227 213 197 166   Cardiac Enzymes: No results for input(s): CKTOTAL, CKMB, CKMBINDEX, TROPONINI in the last 168 hours. BNP: Invalid input(s): POCBNP CBG: No results for input(s): GLUCAP in the last 168 hours. D-Dimer No results for input(s): DDIMER in the last 72 hours. Hgb A1c No results for input(s): HGBA1C in the last 72 hours. Lipid Profile No results for input(s): CHOL, HDL, LDLCALC, TRIG, CHOLHDL, LDLDIRECT in the last 72 hours. Thyroid function studies No results for input(s): TSH, T4TOTAL, T3FREE, THYROIDAB in the last 72 hours.  Invalid input(s): FREET3 Anemia work up No results for input(s):  VITAMINB12, FOLATE, FERRITIN, TIBC, IRON, RETICCTPCT in the last 72 hours. Urinalysis    Component Value Date/Time   COLORURINE YELLOW (A) 05/27/2020 2225   APPEARANCEUR CLOUDY (A) 05/27/2020 2225   LABSPEC 1.043 (H) 05/27/2020 2225   PHURINE 8.0 05/27/2020 2225   GLUCOSEU NEGATIVE 05/27/2020 2225   HGBUR NEGATIVE 05/27/2020 2225   BILIRUBINUR NEGATIVE 05/27/2020 2225   BILIRUBINUR Negative 03/17/2020 1711   KETONESUR NEGATIVE 05/27/2020 2225   PROTEINUR 30 (A) 05/27/2020 2225   UROBILINOGEN 0.2 03/17/2020 1711   NITRITE NEGATIVE 05/27/2020 2225   LEUKOCYTESUR NEGATIVE 05/27/2020 2225   Sepsis Labs Invalid input(s): PROCALCITONIN,  WBC,  LACTICIDVEN Microbiology Recent Results (from the past 240 hour(s))  Resp Panel by RT-PCR (Flu A&B, Covid) Nasopharyngeal Swab     Status: None   Collection Time: 05/28/20  1:24 AM   Specimen: Nasopharyngeal Swab; Nasopharyngeal(NP) swabs in vial transport medium  Result Value Ref Range Status   SARS Coronavirus 2 by RT PCR  NEGATIVE NEGATIVE Final    Comment: (NOTE) SARS-CoV-2 target nucleic acids are NOT DETECTED.  The SARS-CoV-2 RNA is generally detectable in upper respiratory specimens during the acute phase of infection. The lowest concentration of SARS-CoV-2 viral copies this assay can detect is 138 copies/mL. A negative result does not preclude SARS-Cov-2 infection and should not be used as the sole basis for treatment or other patient management decisions. A negative result may occur with  improper specimen collection/handling, submission of specimen other than nasopharyngeal swab, presence of viral mutation(s) within the areas targeted by this assay, and inadequate number of viral copies(<138 copies/mL). A negative result must be combined with clinical observations, patient history, and epidemiological information. The expected result is Negative.  Fact Sheet for Patients:  EntrepreneurPulse.com.au  Fact Sheet for Healthcare Providers:  IncredibleEmployment.be  This test is no t yet approved or cleared by the [place] FDA and  has been authorized for detection and/or diagnosis of SARS-CoV-2 by FDA under an Emergency Use Authorization (EUA). This EUA will remain  in effect (meaning this test can be used) for the duration of the COVID-19 declaration under Section 564(b)(1) of the Act, 21 U.S.C.section 360bbb-3(b)(1), unless the authorization is terminated  or revoked sooner.       Influenza A by PCR NEGATIVE NEGATIVE Final   Influenza B by PCR NEGATIVE NEGATIVE Final    Comment: (NOTE) The Xpert Xpress SARS-CoV-2/FLU/RSV plus assay is intended as an aid in the diagnosis of influenza from Nasopharyngeal swab specimens and should not be used as a sole basis for treatment. Nasal washings and aspirates are unacceptable for Xpert Xpress SARS-CoV-2/FLU/RSV testing.  Fact Sheet for Patients: EntrepreneurPulse.com.au  Fact Sheet for  Healthcare Providers: IncredibleEmployment.be  This test is not yet approved or cleared by the [place] FDA and has been authorized for detection and/or diagnosis of SARS-CoV-2 by FDA under an Emergency Use Authorization (EUA). This EUA will remain in effect (meaning this test can be used) for the duration of the COVID-19 declaration under Section 564(b)(1) of the Act, 21 U.S.C. section 360bbb-3(b)(1), unless the authorization is terminated or revoked.  Performed at [facility], [address] [place], [place] [address]   Culture, blood (single)     Status: None   Collection Time: 05/28/20  2:48 AM   Specimen: BLOOD  Result Value Ref Range Status   Specimen Description BLOOD  Final   Special Requests NONE  Final   Culture   Final    NO GROWTH 5 DAYS  Performed at [facility], [address], [place], [place] [address]    Report Status 06/02/2020 FINAL  Final   Time spent: 86min  SIGNED:   Marylu Lund, MD  [facility] 06/03/2020, 3:07 PM  If 7PM-7AM, please contact night-coverage

## 2020-06-03 NOTE — Progress Notes (Signed)
Peter [name] to be D/C'd home per MD order.  Discussed prescriptions and follow up appointments with the patient. Prescriptions given to patient, medication list explained in detail. Pt verbalized understanding.  Allergies as of 06/03/2020      Reactions   Oxycodone Other (See Comments)   Causes Hallucinations, auditory and tactile   Isosorbide Nausea And Vomiting   Headache, nausea, vomiting      Medication List    TAKE these medications   ALPRAZolam 0.5 MG tablet Commonly known as: XANAX TAKE 1 TABLET(0.5 MG) BY MOUTH TWICE DAILY AS NEEDED FOR ANXIETY   amLODipine 10 MG tablet Commonly known as: NORVASC TAKE 1 TABLET(10 MG) BY MOUTH DAILY   aspirin EC 81 MG tablet Take 81 mg by mouth daily.   atorvastatin 40 MG tablet Commonly known as: LIPITOR TAKE 1 TABLET DAILY   azelastine 0.1 % nasal spray Commonly known as: ASTELIN SMARTSIG:1-2 Spray(s) Both Nares Twice Daily   b complex vitamins capsule Take 1 capsule by mouth daily.   carbamazepine 200 MG tablet Commonly known as: TEGRETOL TAKE 3 TABLETS BY MOUTH EVERY MORNING AND 2 TABLETS BY MOUTH EVERY NIGHT AT BEDTIME   Fish Oil 1000 MG Caps Take 1 capsule by mouth daily.   fluticasone 50 MCG/ACT nasal spray Commonly known as: FLONASE SMARTSIG:1-2 Spray(s) Both Nares Daily   hydrALAZINE 100 MG tablet Commonly known as: APRESOLINE TAKE 1 TABLET THREE TIMES A DAY   Inspra 50 MG tablet Generic drug: eplerenone Take 50 mg by mouth daily.   lamoTRIgine 200 MG tablet Commonly known as: LAMICTAL TAKE 1 AND 1/2 TABLETS(300 MG) BY MOUTH AT BEDTIME   latanoprost 0.005 % ophthalmic solution Commonly known as: XALATAN Place 1 drop into both eyes at bedtime.   losartan 100 MG tablet Commonly known as: COZAAR TAKE 1 TABLET DAILY   metoprolol tartrate 50 MG tablet Commonly known as: LOPRESSOR TAKE 1 TABLET TWICE A DAY   montelukast 10 MG tablet Commonly known as: SINGULAIR Take 10 mg by mouth at bedtime.    Narcan 4 MG/0.1ML Liqd nasal spray kit Generic drug: naloxone USE 1 SPRAY IEN UTD PRN   Nat-Rul Vitamin D 25 MCG (1000 UT) tablet Generic drug: Cholecalciferol Take 1,000 Units by mouth daily.   NON FORMULARY CPAP   omeprazole 40 MG capsule Commonly known as: PRILOSEC Take 1 capsule (40 mg total) by mouth daily before breakfast.   sertraline 100 MG tablet Commonly known as: ZOLOFT TAKE 1 TABLET DAILY   tamsulosin 0.4 MG Caps capsule Commonly known as: FLOMAX Take 1 capsule (0.4 mg total) by mouth daily.   UNABLE TO FIND 900 mg by Per post-pyloric tube route. Med Name: Kyolic   vitamin V-03 5009 MCG tablet Commonly known as: CYANOCOBALAMIN Take 1,000 mcg by mouth daily.       Vitals:   06/03/20 0843 06/03/20 1133  BP: (!) 182/98 (!) 147/70  Pulse: 71 70  Resp: 20   Temp: (!) 97.4 F (36.3 C) 98.3 F (36.8 C)  SpO2: 95% 94%    Skin clean, dry and intact without evidence of skin break down, no evidence of skin tears noted. IV catheter discontinued intact. Site without signs and symptoms of complications. Dressing and pressure applied. Pt denies pain at this time. No complaints noted.  An After Visit Summary was printed and given to the patient. Patient escorted via [place], and D/C home via private auto.  Fuller Mandril, RN

## 2020-06-05 ENCOUNTER — Other Ambulatory Visit: Payer: Self-pay

## 2020-06-05 ENCOUNTER — Ambulatory Visit: Payer: Managed Care, Other (non HMO) | Admitting: Family Medicine

## 2020-06-05 ENCOUNTER — Encounter: Payer: Self-pay | Admitting: Family Medicine

## 2020-06-05 VITALS — BP 129/73 | HR 65 | Temp 98.5°F | Resp 18 | Wt 301.0 lb

## 2020-06-05 DIAGNOSIS — G4733 Obstructive sleep apnea (adult) (pediatric): Secondary | ICD-10-CM | POA: Diagnosis not present

## 2020-06-05 DIAGNOSIS — F3175 Bipolar disorder, in partial remission, most recent episode depressed: Secondary | ICD-10-CM

## 2020-06-05 DIAGNOSIS — I10 Essential (primary) hypertension: Secondary | ICD-10-CM

## 2020-06-05 DIAGNOSIS — K565 Intestinal adhesions [bands], unspecified as to partial versus complete obstruction: Secondary | ICD-10-CM | POA: Diagnosis not present

## 2020-06-05 DIAGNOSIS — E269 Hyperaldosteronism, unspecified: Secondary | ICD-10-CM

## 2020-06-05 DIAGNOSIS — Z6841 Body Mass Index (BMI) 40.0 and over, adult: Secondary | ICD-10-CM

## 2020-06-05 NOTE — Progress Notes (Signed)
I,Peter [name],acting as a scribe for Peter Durie, MD.,have documented all relevant documentation on the behalf of Peter Durie, MD,as directed by  Peter Durie, MD while in the presence of Peter Durie, MD.   Established patient visit   Patient: Peter [name]   DOB: June 01, 1961   59 y.o. Male  MRN: [id number] Visit Date: 06/05/2020  Today's healthcare provider: [name], MD   Chief Complaint  Patient presents with  . Hospitalization Follow-up   Subjective    HPI  Follow up Hospitalization Patient is today for follow-up after hospitalization for small bowel obstruction.  He was able to be treated with conservative measures which included an NG tube would made him miserable but he is slowly getting better.  He is passing gas and having bowel movements and is on a soft diet now.  He is frustrated with how long it is taking him to get his strength back.  Overall he is feeling okay Patient was admitted to [facility] on 05/28/2020 and discharged on 06/03/2020. He was treated for; SBO (small bowel obstruction) Treatment for this included; see note in chart. Telephone follow up was done on none He reports good compliance with treatment. He reports this condition is worsened.  --------------------------------------------------------------------  Patient is having sinus pressure and runny nose since discharge from hospital. Patient states he feels very bad today.      Medications: Outpatient Medications Prior to Visit  Medication Sig  . ALPRAZolam (XANAX) 0.5 MG tablet TAKE 1 TABLET(0.5 MG) BY MOUTH TWICE DAILY AS NEEDED FOR ANXIETY  . amLODipine (NORVASC) 10 MG tablet TAKE 1 TABLET(10 MG) BY MOUTH DAILY  . aspirin EC 81 MG tablet Take 81 mg by mouth daily.  Marland Kitchen atorvastatin (LIPITOR) 40 MG tablet TAKE 1 TABLET DAILY  . azelastine (ASTELIN) 0.1 % nasal spray SMARTSIG:1-2 Spray(s) Both Nares Twice Daily  . b complex vitamins capsule Take 1 capsule by  mouth daily.  . carbamazepine (TEGRETOL) 200 MG tablet TAKE 3 TABLETS BY MOUTH EVERY MORNING AND 2 TABLETS BY MOUTH EVERY NIGHT AT BEDTIME  . Cholecalciferol (NAT-RUL VITAMIN D) 25 MCG (1000 UT) tablet Take 1,000 Units by mouth daily.  . fluticasone (FLONASE) 50 MCG/ACT nasal spray SMARTSIG:1-2 Spray(s) Both Nares Daily  . hydrALAZINE (APRESOLINE) 100 MG tablet TAKE 1 TABLET THREE TIMES A DAY  . lamoTRIgine (LAMICTAL) 200 MG tablet TAKE 1 AND 1/2 TABLETS(300 MG) BY MOUTH AT BEDTIME  . latanoprost (XALATAN) 0.005 % ophthalmic solution Place 1 drop into both eyes at bedtime.   Marland Kitchen losartan (COZAAR) 100 MG tablet TAKE 1 TABLET DAILY  . metoprolol tartrate (LOPRESSOR) 50 MG tablet TAKE 1 TABLET TWICE A DAY  . NON FORMULARY CPAP  . Omega-3 Fatty Acids (FISH OIL) 1000 MG CAPS Take 1 capsule by mouth daily.   Marland Kitchen omeprazole (PRILOSEC) 40 MG capsule Take 1 capsule (40 mg total) by mouth daily before breakfast.  . sertraline (ZOLOFT) 100 MG tablet TAKE 1 TABLET DAILY  . tamsulosin (FLOMAX) 0.4 MG CAPS capsule Take 1 capsule (0.4 mg total) by mouth daily.  . vitamin B-12 (CYANOCOBALAMIN) 1000 MCG tablet Take 1,000 mcg by mouth daily.   Marland Kitchen eplerenone (INSPRA) 50 MG tablet Take 50 mg by mouth daily. (Patient not taking: Reported on 06/05/2020)  . montelukast (SINGULAIR) 10 MG tablet Take 10 mg by mouth at bedtime. (Patient not taking: Reported on 06/05/2020)  . NARCAN 4 MG/0.1ML LIQD nasal spray kit USE 1 SPRAY IEN UTD PRN (  Patient not taking: Reported on 06/05/2020)  . UNABLE TO FIND 900 mg by Per post-pyloric tube route. Med Name: [name] (Patient not taking: Reported on 12/19/2019)   No facility-administered medications prior to visit.    Review of Systems  Constitutional: Negative for appetite change, chills and fever.  Respiratory: Negative for chest tightness, shortness of breath and wheezing.   Cardiovascular: Negative for chest pain and palpitations.  Gastrointestinal: Negative for abdominal pain,  nausea and vomiting.       Objective    BP 129/73 (BP Location: Left Arm, Patient Position: Sitting, Cuff Size: Large)   Pulse 65   Temp 98.5 F (36.9 C) (Oral)   Resp 18   Wt (!) 301 lb (136.5 kg)   SpO2 97%   BMI 47.14 kg/m  BP Readings from Last 3 Encounters:  06/05/20 129/73  06/03/20 (!) 147/70  03/17/20 (!) 162/84   Wt Readings from Last 3 Encounters:  06/05/20 (!) 301 lb (136.5 kg)  05/28/20 (!) 314 lb 9.5 oz (142.7 kg)  03/17/20 (!) 303 lb (137.4 kg)      Physical Exam Vitals reviewed.  Constitutional:      Appearance: He is well-developed.     Comments: Obese white male in no acute distress  HENT:     Head: Normocephalic and atraumatic.     Right Ear: External ear normal.     Left Ear: External ear normal.     Nose: Nose normal.  Eyes:     General: No scleral icterus.    Conjunctiva/sclera: Conjunctivae normal.  Neck:     Thyroid: No thyromegaly.  Cardiovascular:     Rate and Rhythm: Normal rate and regular rhythm.     Heart sounds: Normal heart sounds.  Pulmonary:     Effort: Pulmonary effort is normal.     Breath sounds: Normal breath sounds.  Abdominal:     General: Bowel sounds are normal. There is no distension.     Palpations: Abdomen is soft.     Tenderness: There is no abdominal tenderness.  Musculoskeletal:     Right lower leg: No edema.     Left lower leg: No edema.  Skin:    General: Skin is warm and dry.  Neurological:     General: No focal deficit present.     Mental Status: He is alert and oriented to person, place, and time.     Cranial Nerves: No cranial nerve deficit.     Sensory: No sensory deficit.     Motor: No abnormal muscle tone.  Psychiatric:        Mood and Affect: Mood normal.        Behavior: Behavior normal.        Thought Content: Thought content normal.        Judgment: Judgment normal.       No results found for any visits on 06/05/20.  Assessment & Plan     1. Small bowel obstruction due to adhesions  [facility]) Patient clinically improving.  Slowly advance diet. We talked that it would take time for him to recover from this episode.  He needs to be patient. 2. Essential (primary) hypertension Good control.  3. Obstructive sleep apnea syndrome On CPAP  4. 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  5. Class 3 severe obesity due to excess calories with serious comorbidity and body mass index (BMI) of 45.0 to 49.9 in adult ([place])   6. Bipolar disorder, in partial remission, most recent episode depressed [facility]) Per psychiatry.  No follow-ups on file.          Cranford Mon, MD  [facility] [phone] (phone) [phone] (fax)  [place]

## 2020-07-04 ENCOUNTER — Other Ambulatory Visit: Payer: Self-pay | Admitting: Physician Assistant

## 2020-07-08 ENCOUNTER — Other Ambulatory Visit: Payer: Self-pay

## 2020-07-08 ENCOUNTER — Ambulatory Visit (INDEPENDENT_AMBULATORY_CARE_PROVIDER_SITE_OTHER): Payer: 59 | Admitting: Physician Assistant

## 2020-07-08 ENCOUNTER — Encounter: Payer: Self-pay | Admitting: Physician Assistant

## 2020-07-08 DIAGNOSIS — Z79899 Other long term (current) drug therapy: Secondary | ICD-10-CM | POA: Diagnosis not present

## 2020-07-08 DIAGNOSIS — F411 Generalized anxiety disorder: Secondary | ICD-10-CM

## 2020-07-08 DIAGNOSIS — Z9289 Personal history of other medical treatment: Secondary | ICD-10-CM | POA: Diagnosis not present

## 2020-07-08 DIAGNOSIS — F319 Bipolar disorder, unspecified: Secondary | ICD-10-CM

## 2020-07-08 MED ORDER — B COMPLEX VITAMINS PO CAPS: 1.0000 | ORAL_CAPSULE | Freq: Every day | ORAL | 11 refills | Status: DC

## 2020-07-08 NOTE — Progress Notes (Signed)
[facility] Med Check  Patient ID: Peter [name],  MRN: [id number]  PCP: Jerrol Banana., MD  Date of Evaluation: 07/08/2020 Time spent:40 minutes  Chief Complaint:  Chief Complaint    Anxiety; Depression; Follow-up      HISTORY/CURRENT STATUS: for routine med check.    Since LOV, he was hospitalized for bowel obstruction. No surgery required. Had NGT and recovered fine. No probs at all now.   Has been told his insurance won't pay for Tegretol and it'll be over $300 for 3 months. Unable to afford that. It might be d/t pharmacy not running new ins card.   Mood is good.  Is able to enjoy things, energy and motivation are good. Focus is good, memory is baseline. Not isolating.Anxiety is controlled. Xanax is helping when he needs it.  No SI/HI.   Denies increased energy with decreased need for sleep, no impulsivity, risky behavior, increased libido, or spending.  No grandiosity.  No paranoia and no hallucinations.  Denies dizziness, syncope, seizures, numbness, tingling, tremor, tics, unsteady gait, slurred speech, confusion. Denies muscle or joint pain, stiffness, or dystonia. Denies unexplained weight loss, frequent infections, or sores that heal slowly.  No polyphagia, polydipsia, or polyuria. Denies visual changes or paresthesias.   Individual Medical History/ Review of Systems: Changes? :No    Past medications for mental health diagnoses include: Zoloft, Cymbalta did not help, Pristiq did not help, Seroquel, Xanax, Depakote, Lamictal, Equetro, Klonopin  Allergies: Oxycodone and Isosorbide  Current Medications:  Current Outpatient Medications:  .  ALPRAZolam (XANAX) 0.5 MG tablet, TAKE 1 TABLET(0.5 MG) BY MOUTH TWICE DAILY AS NEEDED FOR ANXIETY, Disp: 60 tablet, Rfl: 5 .  amLODipine (NORVASC) 10 MG tablet, TAKE 1 TABLET(10 MG) BY MOUTH DAILY, Disp: 90 tablet, Rfl: 1 .  aspirin EC 81 MG tablet, Take 81 mg by mouth daily., Disp: , Rfl:  .  atorvastatin (LIPITOR) 40  MG tablet, TAKE 1 TABLET DAILY, Disp: 90 tablet, Rfl: 3 .  azelastine (ASTELIN) 0.1 % nasal spray, SMARTSIG:1-2 Spray(s) Both Nares Twice Daily, Disp: , Rfl:  .  carbamazepine (TEGRETOL) 200 MG tablet, TAKE 3 TABLETS BY MOUTH EVERY MORNING AND 2 TABLETS BY MOUTH EVERY NIGHT AT BEDTIME, Disp: 450 tablet, Rfl: 3 .  Cholecalciferol (NAT-RUL VITAMIN D) 25 MCG (1000 UT) tablet, Take 1,000 Units by mouth daily., Disp: , Rfl:  .  fluticasone (FLONASE) 50 MCG/ACT nasal spray, SMARTSIG:1-2 Spray(s) Both Nares Daily, Disp: , Rfl:  .  hydrALAZINE (APRESOLINE) 100 MG tablet, TAKE 1 TABLET THREE TIMES A DAY, Disp: 270 tablet, Rfl: 3 .  lamoTRIgine (LAMICTAL) 200 MG tablet, TAKE 1 AND 1/2 TABLETS(300 MG) BY MOUTH AT BEDTIME, Disp: 135 tablet, Rfl: 1 .  latanoprost (XALATAN) 0.005 % ophthalmic solution, Place 1 drop into both eyes at bedtime. , Disp: , Rfl:  .  losartan (COZAAR) 100 MG tablet, TAKE 1 TABLET DAILY, Disp: 90 tablet, Rfl: 3 .  metoprolol tartrate (LOPRESSOR) 50 MG tablet, TAKE 1 TABLET TWICE A DAY, Disp: 180 tablet, Rfl: 3 .  NON FORMULARY, CPAP, Disp: , Rfl:  .  Omega-3 Fatty Acids (FISH OIL) 1000 MG CAPS, Take 1 capsule by mouth daily. , Disp: , Rfl:  .  omeprazole (PRILOSEC) 40 MG capsule, Take 1 capsule (40 mg total) by mouth daily before breakfast., Disp: 30 capsule, Rfl: 3 .  sertraline (ZOLOFT) 100 MG tablet, TAKE 1 TABLET DAILY, Disp: 90 tablet, Rfl: 1 .  tamsulosin (FLOMAX) 0.4 MG CAPS capsule, Take 1 capsule (  0.4 mg total) by mouth daily., Disp: 30 capsule, Rfl: 5 .  vitamin B-12 (CYANOCOBALAMIN) 1000 MCG tablet, Take 1,000 mcg by mouth daily. , Disp: , Rfl:  .  b complex vitamins capsule, Take 1 capsule by mouth daily., Disp: 30 capsule, Rfl: 11 .  eplerenone (INSPRA) 50 MG tablet, Take 50 mg by mouth daily. (Patient not taking: Reported on 06/05/2020), Disp: , Rfl:  .  montelukast (SINGULAIR) 10 MG tablet, Take 10 mg by mouth at bedtime. (Patient not taking: No sig reported), Disp: , Rfl:   .  NARCAN 4 MG/0.1ML LIQD nasal spray kit, USE 1 SPRAY IEN UTD PRN (Patient not taking: Reported on 06/05/2020), Disp: , Rfl:  .  UNABLE TO FIND, 900 mg by Per post-pyloric tube route. Med Name: [name] (Patient not taking: No sig reported), Disp: , Rfl:  Medication Side Effects: none  Family Medical/ Social History: Changes? No   MENTAL HEALTH EXAM:  There were no vitals taken for this visit.There is no height or weight on file to calculate BMI.  General Appearance: Casual, Neat, Well Groomed and Obese  Eye Contact:  Good  Speech:  Clear and Coherent and Normal Rate  Volume:  Normal  Mood:  Euthymic  Affect:  Appropriate  Thought Process:  Goal Directed and Descriptions of Associations: Intact  Orientation:  Full (Time, Place, and Person)  Thought Content: Logical   Suicidal Thoughts:  No  Homicidal Thoughts:  No  Memory:  WNL  Judgement:  Good  Insight:  Good  Psychomotor Activity:  Normal  Concentration:  Concentration: Good and Attention Span: Good  Recall:  Good  Fund of Knowledge: Good  Language: Good  Assets:  Desire for Improvement  ADL's:  Intact  Cognition: WNL  Prognosis:  Good   Most recent pertinent labs: 05/31/2020 CBC completely normal 06/03/2020 Nl except Glu 108, Calcium 8.6, Protein 6.2, albumin 3.0, AST 47 No Carbamazepine level in over a year.   DIAGNOSES:    ICD-10-CM   1. Bipolar I disorder ([place])  F31.9   2. Encounter for long-term (current) use of medications  Z79.899 Carbamazepine level, total  3. Generalized anxiety disorder  F41.1   4. Hospitalization or health care facility admission within last 6 months  Z92.89     Receiving Psychotherapy: No    RECOMMENDATIONS:  PDMP was reviewed. I provided 40 minutes of face-to-face time during this encounter, discussing the recent hospitalization, response to meds, need for CBZ level, plus time reviewing the hospital records/labs. Also discussed the CBZ cost if insurance doesn't cover. I checked  GoodRx and lowest price for 3 month supply is around $110. If he needs me to send Rx to different pharmacy for cost of med, he'll let me know.  No changes in meds. Continue Xanax 0.5 mg, 1 p.o. twice daily as needed. Continue carbamazepine 200 mg, 3 p.o. every morning, 2 p.o. nightly. Continue Lamictal 200 mg, 1.5 pills q. at bedtime. Continue Zoloft 100 mg, 1 p.o. twice daily. (per Dr. Rosanna Randy) Continue multivitamin, B complex, omega-3 Ordered CBZ level, sometime in the next few months. Return in 6 months.  Donnal Moat, PA-C

## 2020-07-17 ENCOUNTER — Other Ambulatory Visit: Payer: Self-pay

## 2020-07-17 ENCOUNTER — Ambulatory Visit (INDEPENDENT_AMBULATORY_CARE_PROVIDER_SITE_OTHER): Payer: 59 | Admitting: Family Medicine

## 2020-07-17 ENCOUNTER — Encounter: Payer: Self-pay | Admitting: Family Medicine

## 2020-07-17 VITALS — BP 175/85 | HR 61 | Temp 98.6°F | Wt 303.0 lb

## 2020-07-17 DIAGNOSIS — E7800 Pure hypercholesterolemia, unspecified: Secondary | ICD-10-CM | POA: Diagnosis not present

## 2020-07-17 DIAGNOSIS — I10 Essential (primary) hypertension: Secondary | ICD-10-CM

## 2020-07-17 DIAGNOSIS — K5904 Chronic idiopathic constipation: Secondary | ICD-10-CM | POA: Diagnosis not present

## 2020-07-17 NOTE — Progress Notes (Signed)
Established patient visit   Patient: Peter [name]   DOB: 27-Sep-1960   60 y.o. Male  MRN: [id number] Visit Date: 07/17/2020  Today's healthcare provider: [name], MD   Chief Complaint  Patient presents with  . Hypertension  . Hyperlipidemia   Subjective    Constipation This is a chronic problem. The problem is unchanged. His stool frequency is 2 to 3 times per week. Pertinent negatives include no abdominal pain, bloating, diarrhea, fecal incontinence, nausea, rectal pain or vomiting. The treatment provided mild relief.  Overall patient is doing well. He is not enjoying his new job but is coping with it. Hypertension, follow-up  BP Readings from Last 3 Encounters:  07/17/20 (!) 175/85  06/05/20 129/73  06/03/20 (!) 147/70   Wt Readings from Last 3 Encounters:  07/17/20 (!) 303 lb (137.4 kg)  06/05/20 (!) 301 lb (136.5 kg)  05/28/20 (!) 314 lb 9.5 oz (142.7 kg)     He was last seen for hypertension 1 months ago.  BP at that visit was 129/73. Management since that visit includes no changes.  He reports excellent compliance with treatment. He is not having side effects.  He is following a Low Sodium diet. He is not exercising. He does not smoke.  Use of agents associated with hypertension: none.   Outside blood pressures are not being checked. Symptoms: No chest pain No chest pressure  No palpitations No syncope  No dyspnea No orthopnea  No paroxysmal nocturnal dyspnea No lower extremity edema   Pertinent labs: Lab Results  Component Value Date   CHOL 157 03/25/2020   HDL 76 03/25/2020   LDLCALC 63 03/25/2020   TRIG 98 03/25/2020   CHOLHDL 2.1 03/25/2020   Lab Results  Component Value Date   NA 141 06/03/2020   K 3.8 06/03/2020   CREATININE 0.76 06/03/2020   GFRNONAA >60 06/03/2020   GFRAA 103 03/25/2020   GLUCOSE 108 (H) 06/03/2020     The 10-year ASCVD risk score [name], et al., 2013) is: 11.3%    ---------------------------------------------------------------------------------------------------   Patient Active Problem List   Diagnosis Date Noted  . SBO (small bowel obstruction) ([place]) 05/28/2020  . Recurrent major depressive disorder, in partial remission ([place]) 11/14/2019  . Dysphagia   . Colon cancer screening   . Mild cognitive impairment 06/04/2019  . History of arthroplasty of left shoulder 03/26/2019  . History of total knee arthroplasty 03/21/2019  . GERD (gastroesophageal reflux disease) 12/16/2018  . Erectile dysfunction 09/14/2017  . Open abdominal incision with drainage   . 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 Necrotizing soft tissue infection 11/11/2016  . Class 3 severe obesity due to excess calories with serious comorbidity and body mass index (BMI) of 45.0 to 49.9 in adult ([place]) 09/21/2016  . Decreased libido 09/21/2016  . Shortness of breath 06/15/2016  . Abnormal stress test 06/15/2016  . 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 Avitaminosis D 03/25/2015  . Blood in stool   . Clinical depression 06/21/2011  . Cannot sleep 08/22/2009  . Hypercholesterolemia without hypertriglyceridemia 01/28/2009  . Hay fever 10/22/2008  . Adjustment disorder with mixed anxiety and depressed mood 12/12/2007  . Essential (primary) hypertension 12/12/2007  . Apnea, sleep 12/12/2007   Past Medical History:  Diagnosis Date  . Bipolar disorder ([place])   . Blood in stool   . Difficult intubation   . Dyspnea   . GERD (gastroesophageal reflux disease)   . Glaucoma    since  2004  . Hypertension    since 2000  . Obesity, unspecified   . Sleep apnea   . Ulcer 2011   gastric  . Unspecified hemorrhoids without mention of complication    Social History   Tobacco Use  . Smoking status: Never Smoker  . Smokeless tobacco: Never Used  Vaping Use  . Vaping Use: Never used  Substance Use Topics  . Alcohol use: Not Currently    Comment: occasionally  . Drug use: No    Allergies  Allergen Reactions  . Oxycodone Other (See Comments)    Causes Hallucinations, auditory and tactile  . Isosorbide Nausea And Vomiting    Headache, nausea, vomiting     Medications: Outpatient Medications Prior to Visit  Medication Sig  . ALPRAZolam (XANAX) 0.5 MG tablet TAKE 1 TABLET(0.5 MG) BY MOUTH TWICE DAILY AS NEEDED FOR ANXIETY  . amLODipine (NORVASC) 10 MG tablet TAKE 1 TABLET(10 MG) BY MOUTH DAILY  . aspirin EC 81 MG tablet Take 81 mg by mouth daily.  Marland Kitchen atorvastatin (LIPITOR) 40 MG tablet TAKE 1 TABLET DAILY  . azelastine (ASTELIN) 0.1 % nasal spray SMARTSIG:1-2 Spray(s) Both Nares Twice Daily  . b complex vitamins capsule Take 1 capsule by mouth daily.  . carbamazepine (TEGRETOL) 200 MG tablet TAKE 3 TABLETS BY MOUTH EVERY MORNING AND 2 TABLETS BY MOUTH EVERY NIGHT AT BEDTIME  . Cholecalciferol (NAT-RUL VITAMIN D) 25 MCG (1000 UT) tablet Take 1,000 Units by mouth daily.  . fluticasone (FLONASE) 50 MCG/ACT nasal spray SMARTSIG:1-2 Spray(s) Both Nares Daily  . hydrALAZINE (APRESOLINE) 100 MG tablet TAKE 1 TABLET THREE TIMES A DAY  . lamoTRIgine (LAMICTAL) 200 MG tablet TAKE 1 AND 1/2 TABLETS(300 MG) BY MOUTH AT BEDTIME  . latanoprost (XALATAN) 0.005 % ophthalmic solution Place 1 drop into both eyes at bedtime.   Marland Kitchen losartan (COZAAR) 100 MG tablet TAKE 1 TABLET DAILY  . metoprolol tartrate (LOPRESSOR) 50 MG tablet TAKE 1 TABLET TWICE A DAY  . NON FORMULARY CPAP  . Omega-3 Fatty Acids (FISH OIL) 1000 MG CAPS Take 1 capsule by mouth daily.   Marland Kitchen omeprazole (PRILOSEC) 40 MG capsule Take 1 capsule (40 mg total) by mouth daily before breakfast.  . sertraline (ZOLOFT) 100 MG tablet TAKE 1 TABLET DAILY  . tamsulosin (FLOMAX) 0.4 MG CAPS capsule Take 1 capsule (0.4 mg total) by mouth daily.  . vitamin B-12 (CYANOCOBALAMIN) 1000 MCG tablet Take 1,000 mcg by mouth daily.   Marland Kitchen eplerenone (INSPRA) 50 MG tablet Take 50 mg by mouth daily. (Patient not taking: Reported on 06/05/2020)   . montelukast (SINGULAIR) 10 MG tablet Take 10 mg by mouth at bedtime.  Marland Kitchen NARCAN 4 MG/0.1ML LIQD nasal spray kit USE 1 SPRAY IEN UTD PRN (Patient not taking: Reported on 06/05/2020)  . UNABLE TO FIND 900 mg by Per post-pyloric tube route. Med Name: [name] (Patient not taking: No sig reported)   No facility-administered medications prior to visit.    Review of Systems  Constitutional: Negative.   Respiratory: Negative.   Cardiovascular: Negative.   Gastrointestinal: Positive for constipation. Negative for abdominal distention, abdominal pain, anal bleeding, bloating, blood in stool, diarrhea, nausea, rectal pain and vomiting.  Neurological: Positive for headaches. Negative for dizziness and light-headedness.      Objective    BP (!) 175/85 (BP Location: Left Arm, Patient Position: Sitting, Cuff Size: Large)   Pulse 61   Temp 98.6 F (37 C) (Oral)   Wt (!) 303 lb (137.4 kg)  SpO2 98%   BMI 47.46 kg/m    Physical Exam Vitals reviewed.  Constitutional:      Appearance: He is well-developed.     Comments: Obese white male in no acute distress  HENT:     Head: Normocephalic and atraumatic.     Right Ear: External ear normal.     Left Ear: External ear normal.     Nose: Nose normal.  Eyes:     General: No scleral icterus.    Conjunctiva/sclera: Conjunctivae normal.  Neck:     Thyroid: No thyromegaly.     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     Heart sounds: Normal heart sounds.  Pulmonary:     Effort: Pulmonary effort is normal.     Breath sounds: Normal breath sounds.  Abdominal:     General: Bowel sounds are normal. There is no distension.     Tenderness: There is no abdominal tenderness.     Comments: Midline scar is surrounded by can very firm scar tissue probably 3 inches on either side of the scar.  Genitourinary:    Penis: Normal.      Testes: Normal.  Skin:     General: Skin is warm and dry.  Neurological:     General: No focal deficit present.     Mental Status: He is alert and oriented to person, place, and time.     Cranial Nerves: No cranial nerve deficit.     Sensory: No sensory deficit.     Motor: No abnormal muscle tone.  Psychiatric:        Mood and Affect: Mood normal.        Behavior: Behavior normal.        Thought Content: Thought content normal.        Judgment: Judgment normal.       No results found for any visits on 07/17/20.  Assessment & Plan     1. Chronic idiopathic constipation Linzess 145 mg daily follow-up in a few weeks.  2. Essential (primary) hypertension Well adjust blood pressure medicines on next visit if still elevated.  3. Hypercholesterolemia without hypertriglyceridemia  4.Morbid Obesity  No follow-ups on file.      I, Richard Gilbert Jr, MD, have reviewed all documentation for this visit. The documentation on 07/27/20 for the exam, diagnosis, procedures, and orders are all accurate and complete.    Richard Gilbert Jr, MD  [facility] [phone] (phone) [phone] (fax)  [facility]  

## 2020-08-08 NOTE — Progress Notes (Signed)
I,Peter [name],acting as a scribe for Peter Durie, MD.,have documented all relevant documentation on the behalf of Peter Durie, MD,as directed by  Peter Durie, MD while in the presence of Peter Durie, MD.   Established patient visit   Patient: Peter [name]   DOB: 05-Feb-1961   60 y.o. Male  MRN: [id number] Visit Date: 08/11/2020  Today's healthcare provider: [name], MD   Chief Complaint  Patient presents with  . Follow-up  . Constipation  . Hypertension   Subjective    HPI  Patient has not been checking his blood pressure at home.  Overall he is feeling okay. Depression is stable but is a bit worse today and cold rainy degree weather. Hypertension, follow-up  BP Readings from Last 3 Encounters:  08/11/20 (!) 156/84  07/17/20 (!) 175/85  06/05/20 129/73   Wt Readings from Last 3 Encounters:  08/11/20 (!) 309 lb (140.2 kg)  07/17/20 (!) 303 lb (137.4 kg)  06/05/20 (!) 301 lb (136.5 kg)     He was last seen for hypertension 3 weeks ago.  BP at that visit was 175/85. Management since that visit includes; Will adjust blood pressure medicines on next visit if still elevated.  He reports good compliance with treatment. He is not having side effects. none He is not exercising. He is not adherent to low salt diet.   Outside blood pressures are not checking.  He does not smoke.  Use of agents associated with hypertension: none.   --------------------------------------------------------------------  Chronic idiopathic constipation From 07/17/2020-Linzess 145 mg daily follow-up in a few weeks.   Patient states he has been taking Linzess, however he has to be at home when he takes it. Patient states the medication makes him have several bowel movements.  He states the constipation is much easier to deal with.      Medications: Outpatient Medications Prior to Visit  Medication Sig  . ALPRAZolam (XANAX) 0.5 MG tablet TAKE 1  TABLET(0.5 MG) BY MOUTH TWICE DAILY AS NEEDED FOR ANXIETY  . amLODipine (NORVASC) 10 MG tablet TAKE 1 TABLET(10 MG) BY MOUTH DAILY  . aspirin EC 81 MG tablet Take 81 mg by mouth daily.  Marland Kitchen atorvastatin (LIPITOR) 40 MG tablet TAKE 1 TABLET DAILY  . azelastine (ASTELIN) 0.1 % nasal spray SMARTSIG:1-2 Spray(s) Both Nares Twice Daily  . b complex vitamins capsule Take 1 capsule by mouth daily.  . carbamazepine (TEGRETOL) 200 MG tablet TAKE 3 TABLETS BY MOUTH EVERY MORNING AND 2 TABLETS BY MOUTH EVERY NIGHT AT BEDTIME  . Cholecalciferol (NAT-RUL VITAMIN D) 25 MCG (1000 UT) tablet Take 1,000 Units by mouth daily.  . fluticasone (FLONASE) 50 MCG/ACT nasal spray SMARTSIG:1-2 Spray(s) Both Nares Daily  . hydrALAZINE (APRESOLINE) 100 MG tablet TAKE 1 TABLET THREE TIMES A DAY  . lamoTRIgine (LAMICTAL) 200 MG tablet TAKE 1 AND 1/2 TABLETS(300 MG) BY MOUTH AT BEDTIME  . latanoprost (XALATAN) 0.005 % ophthalmic solution Place 1 drop into both eyes at bedtime.   Marland Kitchen losartan (COZAAR) 100 MG tablet TAKE 1 TABLET DAILY  . metoprolol tartrate (LOPRESSOR) 50 MG tablet TAKE 1 TABLET TWICE A DAY  . montelukast (SINGULAIR) 10 MG tablet Take 10 mg by mouth at bedtime.  . NON FORMULARY CPAP  . Omega-3 Fatty Acids (FISH OIL) 1000 MG CAPS Take 1 capsule by mouth daily.   Marland Kitchen omeprazole (PRILOSEC) 40 MG capsule Take 1 capsule (40 mg total) by mouth daily before breakfast.  .  sertraline (ZOLOFT) 100 MG tablet TAKE 1 TABLET DAILY  . tamsulosin (FLOMAX) 0.4 MG CAPS capsule Take 1 capsule (0.4 mg total) by mouth daily.  . vitamin B-12 (CYANOCOBALAMIN) 1000 MCG tablet Take 1,000 mcg by mouth daily.   Marland Kitchen eplerenone (INSPRA) 50 MG tablet Take 50 mg by mouth daily. (Patient not taking: Reported on 06/05/2020)  . NARCAN 4 MG/0.1ML LIQD nasal spray kit USE 1 SPRAY IEN UTD PRN (Patient not taking: Reported on 06/05/2020)  . UNABLE TO FIND 900 mg by Per post-pyloric tube route. Med Name: Peter [name] (Patient not taking: No sig reported)   No  facility-administered medications prior to visit.    Review of Systems  Constitutional: Negative for appetite change, chills and fever.  Respiratory: Negative for chest tightness, shortness of breath and wheezing.   Cardiovascular: Negative for chest pain and palpitations.  Gastrointestinal: Negative for abdominal pain, nausea and vomiting.        Objective    BP (!) 156/84 (BP Location: Left Arm, Patient Position: Sitting, Cuff Size: Large)   Pulse 68   Temp 98 F (36.7 C) (Oral)   Resp 18   Ht 5\' 7"  (1.702 m)   Wt (!) 309 lb (140.2 kg)   SpO2 97%   BMI 48.40 kg/m  BP Readings from Last 3 Encounters:  08/11/20 (!) 156/84  07/17/20 (!) 175/85  06/05/20 129/73   Wt Readings from Last 3 Encounters:  08/11/20 (!) 309 lb (140.2 kg)  07/17/20 (!) 303 lb (137.4 kg)  06/05/20 (!) 301 lb (136.5 kg)       Physical Exam Vitals reviewed.  Constitutional:      Appearance: He is well-developed.     Comments: Obese white male in no acute distress  HENT:     Head: Normocephalic and atraumatic.     Right Ear: External ear normal.     Left Ear: External ear normal.     Nose: Nose normal.  Eyes:     General: No scleral icterus.    Conjunctiva/sclera: Conjunctivae normal.  Neck:     Thyroid: No thyromegaly.     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     Heart sounds: Normal heart sounds.  Pulmonary:     Effort: Pulmonary effort is normal.     Breath sounds: Normal breath sounds.  Abdominal:     General: Bowel sounds are normal. There is no distension.     Tenderness: There is no abdominal tenderness.     Comments: Midline scar is surrounded by can very firm scar tissue probably 3 inches on either side of the scar.  Skin:    General: Skin is warm and dry.  Neurological:     General: No focal deficit present.     Mental Status: He is alert and oriented to person, place, and time.      Cranial Nerves: No cranial nerve deficit.     Sensory: No sensory deficit.     Motor: No abnormal muscle tone.  Psychiatric:        Mood and Affect: Mood normal.        Behavior: Behavior normal.        Thought Content: Thought content normal.        Judgment: Judgment normal.       No results found for any visits on 08/11/20.  Assessment & Plan     1. Essential (primary) hypertension  Started chlorthalidone 25 mg qd.  Check blood pressure at home and follow-up in a couple of months. - chlorthalidone (HYGROTON) 25 MG tablet; Take 1 tablet (25 mg total) by mouth daily.  Dispense: 30 tablet; Refill: 2  2. Chronic idiopathic constipation Stop Linzess.  Consider lower dose in the future but but presently he is better off without it.   Return in about 2 months (around 10/09/2020).      I, Peter Durie, MD, have reviewed all documentation for this visit. The documentation on 08/11/20 for the exam, diagnosis, procedures, and orders are all accurate and complete.    Lotta Frankenfield Cranford Mon, MD  [facility] [phone] (phone) [phone] (fax)  [place]

## 2020-08-11 ENCOUNTER — Encounter: Payer: Self-pay | Admitting: Family Medicine

## 2020-08-11 ENCOUNTER — Ambulatory Visit (INDEPENDENT_AMBULATORY_CARE_PROVIDER_SITE_OTHER): Payer: 59 | Admitting: Family Medicine

## 2020-08-11 ENCOUNTER — Other Ambulatory Visit: Payer: Self-pay

## 2020-08-11 VITALS — BP 156/84 | HR 68 | Temp 98.0°F | Resp 18 | Ht 67.0 in | Wt 309.0 lb

## 2020-08-11 DIAGNOSIS — K5904 Chronic idiopathic constipation: Secondary | ICD-10-CM

## 2020-08-11 DIAGNOSIS — I10 Essential (primary) hypertension: Secondary | ICD-10-CM

## 2020-08-11 MED ORDER — CHLORTHALIDONE 25 MG PO TABS: 25.0000 mg | ORAL_TABLET | Freq: Every day | ORAL | 2 refills | Status: DC

## 2020-08-11 NOTE — Patient Instructions (Signed)
Stop Linzess. Get an [employer] or [employer] blood pressure cuff to check blood pressures at home.

## 2020-08-22 ENCOUNTER — Telehealth: Payer: Self-pay

## 2020-08-22 NOTE — Telephone Encounter (Signed)
[facility] faxed refill request for the following medications:  sertraline (ZOLOFT) 100 MG tablet   Please advise.

## 2020-08-25 MED ORDER — SERTRALINE HCL 100 MG PO TABS
100.0000 mg | ORAL_TABLET | Freq: Every day | ORAL | 1 refills | Status: DC
Start: 2020-08-25 — End: 2021-02-15

## 2020-08-25 NOTE — Telephone Encounter (Signed)
Rx sent 

## 2020-09-19 ENCOUNTER — Telehealth: Payer: Self-pay

## 2020-09-19 DIAGNOSIS — I10 Essential (primary) hypertension: Secondary | ICD-10-CM

## 2020-09-19 MED ORDER — AMLODIPINE BESYLATE 10 MG PO TABS: 10.0000 mg | ORAL_TABLET | Freq: Every day | ORAL | 0 refills | Status: DC

## 2020-09-19 NOTE — Telephone Encounter (Signed)
[facility] faxed refill request for the following medications:  amLODipine (NORVASC) 10 MG tablet   Please advise.  Thanks, [employer]

## 2020-10-09 ENCOUNTER — Ambulatory Visit: Payer: Self-pay | Admitting: Family Medicine

## 2020-10-27 ENCOUNTER — Encounter: Payer: Self-pay | Admitting: Family Medicine

## 2020-10-27 ENCOUNTER — Other Ambulatory Visit: Payer: Self-pay

## 2020-10-27 ENCOUNTER — Ambulatory Visit (INDEPENDENT_AMBULATORY_CARE_PROVIDER_SITE_OTHER): Payer: 59 | Admitting: Family Medicine

## 2020-10-27 VITALS — BP 145/76 | HR 68 | Resp 20 | Ht 67.0 in | Wt 310.0 lb

## 2020-10-27 DIAGNOSIS — F3175 Bipolar disorder, in partial remission, most recent episode depressed: Secondary | ICD-10-CM

## 2020-10-27 DIAGNOSIS — E269 Hyperaldosteronism, unspecified: Secondary | ICD-10-CM

## 2020-10-27 DIAGNOSIS — I10 Essential (primary) hypertension: Secondary | ICD-10-CM | POA: Diagnosis not present

## 2020-10-27 DIAGNOSIS — Z6841 Body Mass Index (BMI) 40.0 and over, adult: Secondary | ICD-10-CM

## 2020-10-27 DIAGNOSIS — H9193 Unspecified hearing loss, bilateral: Secondary | ICD-10-CM | POA: Diagnosis not present

## 2020-10-27 DIAGNOSIS — M79646 Pain in unspecified finger(s): Secondary | ICD-10-CM | POA: Diagnosis not present

## 2020-10-27 DIAGNOSIS — K5904 Chronic idiopathic constipation: Secondary | ICD-10-CM | POA: Diagnosis not present

## 2020-10-27 DIAGNOSIS — G4733 Obstructive sleep apnea (adult) (pediatric): Secondary | ICD-10-CM

## 2020-10-27 MED ORDER — CLONIDINE HCL 0.1 MG PO TABS: 0.1000 mg | ORAL_TABLET | Freq: Two times a day (BID) | ORAL | 12 refills | Status: DC

## 2020-10-27 NOTE — Patient Instructions (Signed)
Try topical Voltaren gel for thumb pain.

## 2020-10-27 NOTE — Progress Notes (Signed)
Established patient visit   Patient: Peter [name]   DOB: 09-Aug-1960   60 y.o. Male  MRN: [id number] Visit Date: 10/27/2020  Today's healthcare provider: [name], MD   Chief Complaint  Patient presents with  . Hypertension  . Constipation   Subjective    HPI  Patient is doing okay but complains of decreased hearing which his wife is noticed. He also has chronic pain in his hands.  His arthritic type pain. Hypertension, follow-up  BP Readings from Last 3 Encounters:  10/27/20 (!) 145/76  08/11/20 (!) 156/84  07/17/20 (!) 175/85   Wt Readings from Last 3 Encounters:  10/27/20 (!) 310 lb (140.6 kg)  08/11/20 (!) 309 lb (140.2 kg)  07/17/20 (!) 303 lb (137.4 kg)     He was last seen for hypertension 2 months ago.  BP at that visit was 156/84. Management since that visit includes starting chlorthalidone 25mg  daily.  He reports good compliance with treatment. He is not having side effects.  He is following a Regular diet. He is not exercising. He does not smoke.  Use of agents associated with hypertension: none.   Outside blood pressures are being checked daily. Patient reports that BP is averaging in the 150s/80s.   Symptoms: No chest pain No chest pressure  No palpitations No syncope  No dyspnea No orthopnea  No paroxysmal nocturnal dyspnea No lower extremity edema   Pertinent labs: Lab Results  Component Value Date   CHOL 157 03/25/2020   HDL 76 03/25/2020   LDLCALC 63 03/25/2020   TRIG 98 03/25/2020   CHOLHDL 2.1 03/25/2020   Lab Results  Component Value Date   NA 141 06/03/2020   K 3.8 06/03/2020   CREATININE 0.76 06/03/2020   GFRNONAA >60 06/03/2020   GFRAA 103 03/25/2020   GLUCOSE 108 (H) 06/03/2020     The 10-year ASCVD risk score [name], et al., 2013) is: 9.1%   Chronic idiopathic constipation From 08/11/2020- Stop Linzess.  Consider lower dose in the future but but presently he is better off without it.       Medications: Outpatient Medications Prior to Visit  Medication Sig  . ALPRAZolam (XANAX) 0.5 MG tablet TAKE 1 TABLET(0.5 MG) BY MOUTH TWICE DAILY AS NEEDED FOR ANXIETY  . amLODipine (NORVASC) 10 MG tablet Take 1 tablet (10 mg total) by mouth daily.  Marland Kitchen aspirin EC 81 MG tablet Take 81 mg by mouth daily.  Marland Kitchen atorvastatin (LIPITOR) 40 MG tablet TAKE 1 TABLET DAILY  . azelastine (ASTELIN) 0.1 % nasal spray SMARTSIG:1-2 Spray(s) Both Nares Twice Daily  . b complex vitamins capsule Take 1 capsule by mouth daily.  . carbamazepine (TEGRETOL) 200 MG tablet TAKE 3 TABLETS BY MOUTH EVERY MORNING AND 2 TABLETS BY MOUTH EVERY NIGHT AT BEDTIME  . chlorthalidone (HYGROTON) 25 MG tablet Take 1 tablet (25 mg total) by mouth daily.  . Cholecalciferol (NAT-RUL VITAMIN D) 25 MCG (1000 UT) tablet Take 1,000 Units by mouth daily.  . fluticasone (FLONASE) 50 MCG/ACT nasal spray SMARTSIG:1-2 Spray(s) Both Nares Daily  . hydrALAZINE (APRESOLINE) 100 MG tablet TAKE 1 TABLET THREE TIMES A DAY  . lamoTRIgine (LAMICTAL) 200 MG tablet TAKE 1 AND 1/2 TABLETS(300 MG) BY MOUTH AT BEDTIME  . latanoprost (XALATAN) 0.005 % ophthalmic solution Place 1 drop into both eyes at bedtime.   Marland Kitchen losartan (COZAAR) 100 MG tablet TAKE 1 TABLET DAILY  . metoprolol tartrate (LOPRESSOR) 50 MG tablet TAKE 1  TABLET TWICE A DAY  . montelukast (SINGULAIR) 10 MG tablet Take 10 mg by mouth at bedtime.  . NON FORMULARY CPAP  . Omega-3 Fatty Acids (FISH OIL) 1000 MG CAPS Take 1 capsule by mouth daily.   Marland Kitchen omeprazole (PRILOSEC) 40 MG capsule Take 1 capsule (40 mg total) by mouth daily before breakfast.  . sertraline (ZOLOFT) 100 MG tablet Take 1 tablet (100 mg total) by mouth daily.  . tamsulosin (FLOMAX) 0.4 MG CAPS capsule Take 1 capsule (0.4 mg total) by mouth daily.  . vitamin B-12 (CYANOCOBALAMIN) 1000 MCG tablet Take 1,000 mcg by mouth daily.   Marland Kitchen eplerenone (INSPRA) 50 MG tablet Take 50 mg by mouth daily.  Marland Kitchen NARCAN 4 MG/0.1ML LIQD nasal spray  kit USE 1 SPRAY IEN UTD PRN  . UNABLE TO FIND 900 mg by Per post-pyloric tube route. Med Name: [name] (Patient not taking: No sig reported)   No facility-administered medications prior to visit.    Review of Systems  Constitutional: Negative for activity change and fatigue.  Respiratory: Negative for cough and shortness of breath.   Cardiovascular: Negative for chest pain, palpitations and leg swelling.  Gastrointestinal: Negative for constipation, diarrhea, nausea and vomiting.  Musculoskeletal: Negative for arthralgias and myalgias.  Neurological: Negative for dizziness, light-headedness and headaches.        Objective    BP (!) 145/76   Pulse 68   Resp 20   Ht $R'5\' 7"'VR$  (1.702 m)   Wt (!) 310 lb (140.6 kg)   BMI 48.55 kg/m      Physical Exam Vitals reviewed.  Constitutional:      Appearance: He is well-developed.     Comments: Obese white male in no acute distress  HENT:     Head: Normocephalic and atraumatic.     Right Ear: External ear normal.     Left Ear: External ear normal.     Nose: Nose normal.  Eyes:     General: No scleral icterus.    Conjunctiva/sclera: Conjunctivae normal.  Neck:     Thyroid: No thyromegaly.     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     Heart sounds: Normal heart sounds.  Pulmonary:     Effort: Pulmonary effort is normal.     Breath sounds: Normal breath sounds.  Abdominal:     General: Bowel sounds are normal. There is no distension.     Tenderness: There is no abdominal tenderness.     Comments: Midline scar is surrounded by can very firm scar tissue probably 3 inches on either side of the scar.  Skin:    General: Skin is warm and dry.  Neurological:     General: No focal deficit present.     Mental Status: He is alert and oriented to person, place, and time.     Cranial Nerves: No cranial nerve deficit.     Sensory: No sensory deficit.      Motor: No abnormal muscle tone.  Psychiatric:        Mood and Affect: Mood normal.        Behavior: Behavior normal.        Thought Content: Thought content normal.        Judgment: Judgment normal.       No results found for any visits on 10/27/20.  Assessment & Plan      1. Essential (primary) hypertension Well controlled.  At this time reluctantly add clonidine 0.1 mg twice daily.  Follow-up in 1 to 2 months. - cloNIDine (CATAPRES) 0.1 MG tablet; Take 1 tablet (0.1 mg total) by mouth 2 (two) times daily.  Dispense: 60 tablet; Refill: 12  2. Chronic idiopathic constipation Consider Linzess  3. Decreased hearing of both ears For to ENT - Ambulatory referral to ENT  4. Thumb pain, unspecified laterality Try topical Voltaren gel .  Consider x-rays or referral to hand surgeon.  5. Class 3 severe obesity due to excess calories with serious comorbidity and body mass index (BMI) of 45.0 to 49.9 in adult [facility]) Diet and exercise stressed as I really think this will help with blood pressure.  6. Obstructive sleep apnea syndrome   7. Hyperaldosteronism ([place])   8. Bipolar disorder, in partial remission, most recent episode depressed [facility]) Per psychiatry.   No follow-ups on file.      I, [name], MD, have reviewed all documentation for this visit. The documentation on 11/01/20 for the exam, diagnosis, procedures, and orders are all accurate and complete.    Tamarion Haymond Cranford Mon, MD  [facility] [phone] (phone) [phone] (fax)  [place]

## 2020-10-31 ENCOUNTER — Telehealth: Payer: Self-pay

## 2020-10-31 ENCOUNTER — Other Ambulatory Visit: Payer: Self-pay | Admitting: Physician Assistant

## 2020-10-31 DIAGNOSIS — I10 Essential (primary) hypertension: Secondary | ICD-10-CM

## 2020-10-31 MED ORDER — CHLORTHALIDONE 25 MG PO TABS: 25.0000 mg | ORAL_TABLET | Freq: Every day | ORAL | 2 refills | Status: DC

## 2020-10-31 NOTE — Telephone Encounter (Signed)
Rx was sent to pharmacy. 

## 2020-10-31 NOTE — Telephone Encounter (Signed)
[facility] faxed refill request for the following medications:  chlorthalidone (HYGROTON) 25 MG tablet   Please advise.

## 2020-11-12 ENCOUNTER — Telehealth: Payer: 59 | Admitting: Physician Assistant

## 2020-11-12 DIAGNOSIS — J019 Acute sinusitis, unspecified: Secondary | ICD-10-CM | POA: Diagnosis not present

## 2020-11-12 DIAGNOSIS — B9789 Other viral agents as the cause of diseases classified elsewhere: Secondary | ICD-10-CM

## 2020-11-12 MED ORDER — IPRATROPIUM BROMIDE 0.03 % NA SOLN: 2.0000 | Freq: Two times a day (BID) | NASAL | 0 refills | Status: DC

## 2020-11-12 NOTE — Progress Notes (Signed)
We are sorry that you are not feeling well.  Here is how we plan to help!  Based on what you have shared with me it looks like you have sinusitis.  Sinusitis is inflammation and infection in the sinus cavities of the head.  Based on your presentation I believe you most likely have Acute Viral Sinusitis.This is an infection most likely caused by a virus. There is not specific treatment for viral sinusitis other than to help you with the symptoms until the infection runs its course.  You may use an oral decongestant such as Mucinex D or if you have glaucoma or high blood pressure use plain Mucinex. Saline nasal spray help and can safely be used as often as needed for congestion, I have prescribed: Ipratropium Bromide nasal spray 0.03% 2 sprays in eah nostril 2-3 times a day to use instead of the Astelin spray and Flonase listed on your medicine list.   Some authorities believe that zinc sprays or the use of Echinacea may shorten the course of your symptoms.  Sinus infections are not as easily transmitted as other respiratory infection, however we still recommend that you avoid close contact with loved ones, especially the very young and elderly.  Remember to wash your hands thoroughly throughout the day as this is the number one way to prevent the spread of infection!  Home Care:  Only take medications as instructed by your medical team.  Do not take these medications with alcohol.  A steam or ultrasonic humidifier can help congestion.  You can place a towel over your head and breathe in the steam from hot water coming from a faucet.  Avoid close contacts especially the very young and the elderly.  Cover your mouth when you cough or sneeze.  Always remember to wash your hands.  Get Help Right Away If:  You develop worsening fever or sinus pain.  You develop a severe head ache or visual changes.  Your symptoms persist after you have completed your treatment plan.  Make sure  you  Understand these instructions.  Will watch your condition.  Will get help right away if you are not doing well or get worse.  Your e-visit answers were reviewed by a board certified advanced clinical practitioner to complete your personal care plan.  Depending on the condition, your plan could have included both over the counter or prescription medications.  If there is a problem please reply  once you have received a response from your provider.  Your safety is important to [place].  If you have drug allergies check your prescription carefully.    You can use MyChart to ask questions about today's visit, request a non-urgent call back, or ask for a work or school excuse for 24 hours related to this e-Visit. If it has been greater than 24 hours you will need to follow up with your provider, or enter a new e-Visit to address those concerns.  You will get an e-mail in the next two days asking about your experience.  I hope that your e-visit has been valuable and will speed your recovery. Thank you for using e-visits.

## 2020-11-12 NOTE — Progress Notes (Signed)
I have spent 5 minutes in review of e-visit questionnaire, review and updating patient chart, medical decision making and response to patient.   Jolan Upchurch Cody Alezandra Egli, PA-C    

## 2020-11-13 MED ORDER — TRIAMCINOLONE ACETONIDE 55 MCG/ACT NA AERO: 2.0000 | INHALATION_SPRAY | Freq: Every day | NASAL | 12 refills | Status: DC

## 2020-11-13 NOTE — Addendum Note (Signed)
Addended by: Raiford Noble C on: 11/13/2020 11:00 AM   Modules accepted: Orders

## 2020-11-21 ENCOUNTER — Other Ambulatory Visit: Payer: Self-pay

## 2020-11-21 MED ORDER — ALPRAZOLAM 0.5 MG PO TABS: ORAL_TABLET | ORAL | 1 refills | Status: DC

## 2020-12-23 ENCOUNTER — Other Ambulatory Visit: Payer: Self-pay

## 2020-12-23 ENCOUNTER — Encounter: Payer: Self-pay | Admitting: Family Medicine

## 2020-12-23 ENCOUNTER — Ambulatory Visit (INDEPENDENT_AMBULATORY_CARE_PROVIDER_SITE_OTHER): Payer: 59 | Admitting: Family Medicine

## 2020-12-23 VITALS — BP 146/84 | HR 76 | Resp 20 | Ht 67.0 in | Wt 311.0 lb

## 2020-12-23 DIAGNOSIS — G4733 Obstructive sleep apnea (adult) (pediatric): Secondary | ICD-10-CM | POA: Diagnosis not present

## 2020-12-23 DIAGNOSIS — I10 Essential (primary) hypertension: Secondary | ICD-10-CM | POA: Diagnosis not present

## 2020-12-23 DIAGNOSIS — E7800 Pure hypercholesterolemia, unspecified: Secondary | ICD-10-CM

## 2020-12-23 DIAGNOSIS — F3341 Major depressive disorder, recurrent, in partial remission: Secondary | ICD-10-CM

## 2020-12-23 DIAGNOSIS — F319 Bipolar disorder, unspecified: Secondary | ICD-10-CM

## 2020-12-23 DIAGNOSIS — R5383 Other fatigue: Secondary | ICD-10-CM

## 2020-12-23 DIAGNOSIS — Z6841 Body Mass Index (BMI) 40.0 and over, adult: Secondary | ICD-10-CM

## 2020-12-23 MED ORDER — CLONIDINE HCL 0.2 MG PO TABS: 0.2000 mg | ORAL_TABLET | Freq: Two times a day (BID) | ORAL | 3 refills | Status: DC

## 2020-12-23 NOTE — Progress Notes (Signed)
Established patient visit   Patient: Peter [name]   DOB: July 21, 1960   60 y.o. Male  MRN: [id number] Visit Date: 12/23/2020  Today's healthcare provider: [name], MD   Chief Complaint  Patient presents with   Hypertension   Subjective    HPI  Patient has no physical complaints of ball but is not enjoying his job situation at all.  He has chronic fatigue and knee pain along with dyspnea on exertion. Hypertension, follow-up  BP Readings from Last 3 Encounters:  12/23/20 (!) 146/84  10/27/20 (!) 145/76  08/11/20 (!) 156/84   Wt Readings from Last 3 Encounters:  12/23/20 (!) 311 lb (141.1 kg)  10/27/20 (!) 310 lb (140.6 kg)  08/11/20 (!) 309 lb (140.2 kg)     He was last seen for hypertension 2 months ago.  BP at that visit was 145/76. Management since that visit includes adding clonidine 0.1mg  BID.  He reports good compliance with treatment. He is not having side effects.  He is following a Regular diet. He is not exercising. He does not smoke.  Use of agents associated with hypertension: none.   Outside blood pressures are checked occasionally. 140s/70s.  Symptoms: No chest pain No chest pressure  No palpitations No syncope  No dyspnea No orthopnea  No paroxysmal nocturnal dyspnea No lower extremity edema   Pertinent labs: Lab Results  Component Value Date   CHOL 157 03/25/2020   HDL 76 03/25/2020   LDLCALC 63 03/25/2020   TRIG 98 03/25/2020   CHOLHDL 2.1 03/25/2020   Lab Results  Component Value Date   NA 141 06/03/2020   K 3.8 06/03/2020   CREATININE 0.76 06/03/2020   GFRNONAA >60 06/03/2020   GFRAA 103 03/25/2020   GLUCOSE 108 (H) 06/03/2020     The 10-year ASCVD risk score [name], et al., 2013) is: 9.2%         Medications: Outpatient Medications Prior to Visit  Medication Sig   ALPRAZolam (XANAX) 0.5 MG tablet TAKE 1 TABLET(0.5 MG) BY MOUTH TWICE DAILY AS NEEDED FOR ANXIETY   amLODipine (NORVASC) 10 MG tablet Take  1 tablet (10 mg total) by mouth daily.   aspirin EC 81 MG tablet Take 81 mg by mouth daily.   atorvastatin (LIPITOR) 40 MG tablet TAKE 1 TABLET DAILY   azelastine (ASTELIN) 0.1 % nasal spray SMARTSIG:1-2 Spray(s) Both Nares Twice Daily   b complex vitamins capsule Take 1 capsule by mouth daily.   carbamazepine (TEGRETOL) 200 MG tablet TAKE 3 TABLETS BY MOUTH EVERY MORNING AND 2 TABLETS BY MOUTH EVERY NIGHT AT BEDTIME   chlorthalidone (HYGROTON) 25 MG tablet Take 1 tablet (25 mg total) by mouth daily.   Cholecalciferol (NAT-RUL VITAMIN D) 25 MCG (1000 UT) tablet Take 1,000 Units by mouth daily.   cloNIDine (CATAPRES) 0.1 MG tablet Take 1 tablet (0.1 mg total) by mouth 2 (two) times daily.   eplerenone (INSPRA) 50 MG tablet Take 50 mg by mouth daily.   hydrALAZINE (APRESOLINE) 100 MG tablet TAKE 1 TABLET THREE TIMES A DAY   lamoTRIgine (LAMICTAL) 200 MG tablet TAKE ONE AND ONE-HALF TABLETS BY MOUTH AT BEDTIME   latanoprost (XALATAN) 0.005 % ophthalmic solution Place 1 drop into both eyes at bedtime.    losartan (COZAAR) 100 MG tablet TAKE 1 TABLET DAILY   metoprolol tartrate (LOPRESSOR) 50 MG tablet TAKE 1 TABLET TWICE A DAY   montelukast (SINGULAIR) 10 MG tablet Take 10 mg by  mouth at bedtime.   NARCAN 4 MG/0.1ML LIQD nasal spray kit USE 1 SPRAY IEN UTD PRN   NON FORMULARY CPAP   Omega-3 Fatty Acids (FISH OIL) 1000 MG CAPS Take 1 capsule by mouth daily.    omeprazole (PRILOSEC) 40 MG capsule Take 1 capsule (40 mg total) by mouth daily before breakfast.   sertraline (ZOLOFT) 100 MG tablet Take 1 tablet (100 mg total) by mouth daily.   tamsulosin (FLOMAX) 0.4 MG CAPS capsule Take 1 capsule (0.4 mg total) by mouth daily.   triamcinolone (NASACORT) 55 MCG/ACT AERO nasal inhaler Place 2 sprays into the nose daily.   UNABLE TO FIND 900 mg by Per post-pyloric tube route. Med Name: Peter [name] (Patient not taking: No sig reported)   vitamin B-12 (CYANOCOBALAMIN) 1000 MCG tablet Take 1,000 mcg by mouth  daily.    No facility-administered medications prior to visit.    Review of Systems  Constitutional:  Negative for activity change and fatigue.  Respiratory:  Negative for cough and shortness of breath.   Cardiovascular:  Negative for chest pain, palpitations and leg swelling.  Neurological:  Negative for dizziness and headaches.       Objective    BP (!) 146/84   Pulse 76   Resp 20   Ht $R'5\' 7"'QY$  (1.702 m)   Wt (!) 311 lb (141.1 kg)   BMI 48.71 kg/m  BP Readings from Last 3 Encounters:  12/23/20 (!) 146/84  10/27/20 (!) 145/76  08/11/20 (!) 156/84   Wt Readings from Last 3 Encounters:  12/23/20 (!) 311 lb (141.1 kg)  10/27/20 (!) 310 lb (140.6 kg)  08/11/20 (!) 309 lb (140.2 kg)       Physical Exam Vitals reviewed.  Constitutional:      Appearance: He is well-developed.     Comments: Obese white male in no acute distress  HENT:     Head: Normocephalic and atraumatic.     Right Ear: External ear normal.     Left Ear: External ear normal.     Nose: Nose normal.  Eyes:     General: No scleral icterus.    Conjunctiva/sclera: Conjunctivae normal.  Neck:     Thyroid: No thyromegaly.     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     Heart sounds: Normal heart sounds.  Pulmonary:     Effort: Pulmonary effort is normal.     Breath sounds: Normal breath sounds.  Abdominal:     General: Bowel sounds are normal. There is no distension.     Tenderness: There is no abdominal tenderness.     Comments: Midline scar is surrounded by can very firm scar tissue probably 3 inches on either side of the scar.  Skin:    General: Skin is warm and dry.  Neurological:     General: No focal deficit present.     Mental Status: He is alert and oriented to person, place, and time.     Cranial Nerves: No cranial nerve deficit.     Sensory: No sensory deficit.     Motor: No abnormal muscle tone.   Psychiatric:        Mood and Affect: Mood normal.        Behavior: Behavior normal.        Thought Content: Thought content normal.        Judgment: Judgment normal.      No  results found for any visits on 12/23/20.  Assessment & Plan     1. Essential (primary) hypertension Not well controlled.  Increase clonidine 2.1 to 2.2 mg twice daily. - Comprehensive metabolic panel - cloNIDine (CATAPRES) 0.2 MG tablet; Take 1 tablet (0.2 mg total) by mouth 2 (two) times daily.  Dispense: 60 tablet; Refill: 3  2. Obstructive sleep apnea syndrome Wears CPAP nightly  3. Hypercholesterolemia without hypertriglyceridemia Atorvastatin 40 - Lipid panel  4. Bipolar depression ([place]) Followed by psychiatry.  Continue present medications and check carbamazepine level - Carbamazepine level, total; Future - Cortisol - Carbamazepine level, total  5. Recurrent major depressive disorder, in partial remission ([place]) Psychiatry - CBC with Differential/Platelet - TSH  6. Other fatigue Factorial. - B12 and Folate Panel  7. Class 3 severe obesity due to excess calories with serious comorbidity and body mass index (BMI) of 40.0 to 44.9 in adult [facility]) Today stressed with at least 20 minutes of walking daily. - Hemoglobin A1c   No follow-ups on file.      I, [name], MD, have reviewed all documentation for this visit. The documentation on 12/28/20 for the exam, diagnosis, procedures, and orders are all accurate and complete.    Mikhaela Zaugg Cranford Mon, MD  [facility] [phone] (phone) [phone] (fax)  [place]

## 2020-12-25 LAB — CBC WITH DIFFERENTIAL/PLATELET
Basophils Absolute: 0 10*3/uL (ref 0.0–0.2)
Basos: 1 %
EOS (ABSOLUTE): 0.1 10*3/uL (ref 0.0–0.4)
Eos: 2 %
Hematocrit: 41.8 % (ref 37.5–51.0)
Hemoglobin: 14.2 g/dL (ref 13.0–17.7)
Immature Grans (Abs): 0 10*3/uL (ref 0.0–0.1)
Immature Granulocytes: 0 %
Lymphocytes Absolute: 2.3 10*3/uL (ref 0.7–3.1)
Lymphs: 35 %
MCH: 29.5 pg (ref 26.6–33.0)
MCHC: 34 g/dL (ref 31.5–35.7)
MCV: 87 fL (ref 79–97)
Monocytes Absolute: 0.6 10*3/uL (ref 0.1–0.9)
Monocytes: 9 %
Neutrophils Absolute: 3.5 10*3/uL (ref 1.4–7.0)
Neutrophils: 53 %
Platelets: 207 10*3/uL (ref 150–450)
RBC: 4.82 x10E6/uL (ref 4.14–5.80)
RDW: 12.6 % (ref 11.6–15.4)
WBC: 6.5 10*3/uL (ref 3.4–10.8)

## 2020-12-25 LAB — COMPREHENSIVE METABOLIC PANEL
ALT: 19 IU/L (ref 0–44)
AST: 23 IU/L (ref 0–40)
Albumin/Globulin Ratio: 1.3 (ref 1.2–2.2)
Albumin: 3.9 g/dL (ref 3.8–4.8)
Alkaline Phosphatase: 119 IU/L (ref 44–121)
BUN/Creatinine Ratio: 20 (ref 10–24)
BUN: 20 mg/dL (ref 8–27)
Bilirubin Total: 0.5 mg/dL (ref 0.0–1.2)
CO2: 26 mmol/L (ref 20–29)
Calcium: 8.9 mg/dL (ref 8.6–10.2)
Chloride: 103 mmol/L (ref 96–106)
Creatinine, Ser: 1 mg/dL (ref 0.76–1.27)
Globulin, Total: 2.9 g/dL (ref 1.5–4.5)
Glucose: 89 mg/dL (ref 65–99)
Potassium: 3.3 mmol/L — ABNORMAL LOW (ref 3.5–5.2)
Sodium: 144 mmol/L (ref 134–144)
Total Protein: 6.8 g/dL (ref 6.0–8.5)
eGFR: 85 mL/min/{1.73_m2} (ref >59)

## 2020-12-25 LAB — CORTISOL: Cortisol: 14.3 ug/dL

## 2020-12-25 LAB — LIPID PANEL
Chol/HDL Ratio: 2.1 ratio (ref 0.0–5.0)
Cholesterol, Total: 152 mg/dL (ref 100–199)
HDL: 73 mg/dL (ref >39)
LDL Chol Calc (NIH): 59 mg/dL (ref 0–99)
Triglycerides: 112 mg/dL (ref 0–149)
VLDL Cholesterol Cal: 20 mg/dL (ref 5–40)

## 2020-12-25 LAB — B12 AND FOLATE PANEL
Folate: 7.6 ng/mL (ref >3.0)
Vitamin B-12: 663 pg/mL (ref 232–1245)

## 2020-12-25 LAB — HEMOGLOBIN A1C
Est. average glucose Bld gHb Est-mCnc: 97 mg/dL
Hgb A1c MFr Bld: 5 % (ref 4.8–5.6)

## 2020-12-25 LAB — CARBAMAZEPINE LEVEL, TOTAL: Carbamazepine (Tegretol), S: 9.4 ug/mL (ref 4.0–12.0)

## 2020-12-25 LAB — TSH: TSH: 2.77 u[IU]/mL (ref 0.450–4.500)

## 2021-01-07 ENCOUNTER — Ambulatory Visit: Payer: 59 | Admitting: Physician Assistant

## 2021-01-20 ENCOUNTER — Other Ambulatory Visit: Payer: Self-pay | Admitting: Family Medicine

## 2021-01-20 DIAGNOSIS — I10 Essential (primary) hypertension: Secondary | ICD-10-CM

## 2021-01-29 ENCOUNTER — Other Ambulatory Visit: Payer: Self-pay | Admitting: Physician Assistant

## 2021-02-03 ENCOUNTER — Ambulatory Visit: Payer: 59 | Admitting: Physician Assistant

## 2021-02-03 ENCOUNTER — Other Ambulatory Visit: Payer: Self-pay

## 2021-02-03 ENCOUNTER — Encounter: Payer: Self-pay | Admitting: Physician Assistant

## 2021-02-03 DIAGNOSIS — F411 Generalized anxiety disorder: Secondary | ICD-10-CM

## 2021-02-03 DIAGNOSIS — F319 Bipolar disorder, unspecified: Secondary | ICD-10-CM | POA: Diagnosis not present

## 2021-02-03 DIAGNOSIS — R5383 Other fatigue: Secondary | ICD-10-CM | POA: Diagnosis not present

## 2021-02-03 DIAGNOSIS — G4700 Insomnia, unspecified: Secondary | ICD-10-CM

## 2021-02-03 MED ORDER — LAMOTRIGINE 200 MG PO TABS: 200.0000 mg | ORAL_TABLET | Freq: Two times a day (BID) | ORAL | 5 refills | Status: DC

## 2021-02-03 NOTE — Progress Notes (Signed)
[facility] Med Check  Patient ID: Peter [name],  MRN: [id number]  PCP: Jerrol Banana., MD  Date of Evaluation: 02/03/2021 Time spent:40 minutes  Chief Complaint:  Chief Complaint   Anxiety; Depression; Follow-up      HISTORY/CURRENT STATUS: for routine med check.  Last seen 6 months ago.  Work is stressful. Is not happy there.  But other than that things are going pretty well. Patient denies loss of interest in usual activities and is able to enjoy things.  Energy and motivation are very low, worse on weekdays when he has to go to work but can also occur on weekends.  States it takes everything he has to make himself get up and go to work.  He and his wife do not do a lot of things outside the home so that has not changed.  He sometimes wants to sleep more.  He does have a little more irritability than normal.  Appetite has not changed and no change in weight.  No extreme sadness, tearfulness, or feelings of hopelessness. Denies suicidal or homicidal thoughts.  Anxiety is well controlled as long as he has the Xanax.  He is not having panic attacks so much as a generalized sense of ease.  Patient denies increased energy with decreased need for sleep, no increased talkativeness, no racing thoughts, no impulsivity or risky behaviors, no increased spending, no increased libido, no grandiosity, and no hallucinations.  Denies dizziness, syncope, seizures, numbness, tingling, tremor, tics, unsteady gait, slurred speech, confusion. Denies muscle or joint pain, stiffness, or dystonia. Denies unexplained weight loss, frequent infections, or sores that heal slowly.  No polyphagia, polydipsia, or polyuria. Denies visual changes or paresthesias.   Individual Medical History/ Review of Systems: Changes? :No    Past medications for mental health diagnoses include: Zoloft, Cymbalta did not help, Pristiq did not help, Seroquel, Xanax, Depakote, Lamictal, Equetro, Klonopin  Allergies:  Oxycodone and Isosorbide  Current Medications:  Current Outpatient Medications:    ALPRAZolam (XANAX) 0.5 MG tablet, TAKE 1 TABLET(0.5 MG) BY MOUTH TWICE DAILY AS NEEDED FOR ANXIETY, Disp: 60 tablet, Rfl: 1   amLODipine (NORVASC) 10 MG tablet, Take 1 tablet (10 mg total) by mouth daily., Disp: 90 tablet, Rfl: 0   aspirin EC 81 MG tablet, Take 81 mg by mouth daily., Disp: , Rfl:    atorvastatin (LIPITOR) 40 MG tablet, TAKE 1 TABLET DAILY, Disp: 90 tablet, Rfl: 3   azelastine (ASTELIN) 0.1 % nasal spray, SMARTSIG:1-2 Spray(s) Both Nares Twice Daily, Disp: , Rfl:    b complex vitamins capsule, Take 1 capsule by mouth daily., Disp: 30 capsule, Rfl: 11   carbamazepine (TEGRETOL) 200 MG tablet, TAKE 3 TABLETS BY MOUTH EVERY MORNING AND 2 TABLETS BY MOUTH EVERY NIGHT AT BEDTIME, Disp: 450 tablet, Rfl: 3   chlorthalidone (HYGROTON) 25 MG tablet, Take 1 tablet (25 mg total) by mouth daily., Disp: 30 tablet, Rfl: 2   Cholecalciferol (NAT-RUL VITAMIN D) 25 MCG (1000 UT) tablet, Take 1,000 Units by mouth daily., Disp: , Rfl:    cloNIDine (CATAPRES) 0.2 MG tablet, Take 1 tablet (0.2 mg total) by mouth 2 (two) times daily., Disp: 60 tablet, Rfl: 3   hydrALAZINE (APRESOLINE) 100 MG tablet, TAKE 1 TABLET THREE TIMES A DAY, Disp: 270 tablet, Rfl: 3   latanoprost (XALATAN) 0.005 % ophthalmic solution, Place 1 drop into both eyes at bedtime. , Disp: , Rfl:    losartan (COZAAR) 100 MG tablet, TAKE 1 TABLET DAILY, Disp: 90 tablet,  Rfl: 3   metoprolol tartrate (LOPRESSOR) 50 MG tablet, TAKE 1 TABLET TWICE A DAY, Disp: 180 tablet, Rfl: 3   NON FORMULARY, CPAP, Disp: , Rfl:    Omega-3 Fatty Acids (FISH OIL) 1000 MG CAPS, Take 1 capsule by mouth daily. , Disp: , Rfl:    omeprazole (PRILOSEC) 40 MG capsule, Take 1 capsule (40 mg total) by mouth daily before breakfast., Disp: 30 capsule, Rfl: 3   sertraline (ZOLOFT) 100 MG tablet, Take 1 tablet (100 mg total) by mouth daily., Disp: 90 tablet, Rfl: 1   vitamin B-12  (CYANOCOBALAMIN) 1000 MCG tablet, Take 1,000 mcg by mouth daily. , Disp: , Rfl:    eplerenone (INSPRA) 50 MG tablet, Take 50 mg by mouth daily., Disp: , Rfl:    lamoTRIgine (LAMICTAL) 200 MG tablet, Take 1 tablet (200 mg total) by mouth 2 (two) times daily., Disp: 60 tablet, Rfl: 5   montelukast (SINGULAIR) 10 MG tablet, Take 10 mg by mouth at bedtime., Disp: , Rfl:    NARCAN 4 MG/0.1ML LIQD nasal spray kit, USE 1 SPRAY IEN UTD PRN, Disp: , Rfl:    tamsulosin (FLOMAX) 0.4 MG CAPS capsule, Take 1 capsule (0.4 mg total) by mouth daily., Disp: 30 capsule, Rfl: 5   triamcinolone (NASACORT) 55 MCG/ACT AERO nasal inhaler, Place 2 sprays into the nose daily. (Patient not taking: Reported on 02/03/2021), Disp: 1 each, Rfl: 12   UNABLE TO FIND, 900 mg by Per post-pyloric tube route. Med Name: [name] (Patient not taking: No sig reported), Disp: , Rfl:  Medication Side Effects: none  Family Medical/ Social History: Changes? No  MENTAL HEALTH EXAM:  There were no vitals taken for this visit.There is no height or weight on file to calculate BMI.  General Appearance: Casual, Neat, Well Groomed and Obese  Eye Contact:  Good  Speech:  Clear and Coherent and Normal Rate  Volume:  Normal  Mood:  Euthymic  Affect:  Appropriate  Thought Process:  Goal Directed and Descriptions of Associations: Circumstantial  Orientation:  Full (Time, Place, and Person)  Thought Content: Logical   Suicidal Thoughts:  No  Homicidal Thoughts:  No  Memory:  WNL  Judgement:  Good  Insight:  Good  Psychomotor Activity:  Normal  Concentration:  Concentration: Good and Attention Span: Good  Recall:  Good  Fund of Knowledge: Good  Language: Good  Assets:  Desire for Improvement  ADL's:  Intact  Cognition: WNL  Prognosis:  Good   Most recent pertinent labs: 12/24/2020 Carbamazepine level 9.4 CBC was normal Potassium was low at 3.3, otherwise normal CMP. TSH normal.  Hemoglobin A1c 5.0 Lipid panel completely  normal  DIAGNOSES:    ICD-10-CM   1. Bipolar I disorder ([place])  F31.9     2. Generalized anxiety disorder  F41.1     3. Insomnia, unspecified type  G47.00     4. Fatigue, unspecified type  R53.83        Receiving Psychotherapy: No    RECOMMENDATIONS:  PDMP was reviewed.  Last Xanax filled 11/21/2020. I provided 40  minutes of face to face time during this encounter, including time spent before and after the visit in records review, medical decision making, and charting.  It is difficult to know if the fatigue is concerning or not.  Labs were reviewed as noted above and further details in epic.  We did discuss modafinil as it can help with energy and motivation but neither robust would like to add another  drug into the mix.  I recommend increasing the Lamictal, since he is on a very low dose, and because of the carbamazepine the Lamictal is metabolized more quickly. Continue Xanax 0.5 mg, 1 p.o. twice daily as needed. Continue carbamazepine 200 mg, 3 p.o. every morning, 2 p.o. nightly. Increase Lamictal 200 mg to 1 p.o. twice daily.  Continue Zoloft 100 mg, 1 p.o. twice daily. (per Dr. Rosanna Randy) Continue multivitamin, B complex, omega-3, vitamin D per med sheet. Return in 6 weeks.  Donnal Moat, PA-C

## 2021-02-15 ENCOUNTER — Other Ambulatory Visit: Payer: Self-pay | Admitting: Family Medicine

## 2021-02-15 NOTE — Telephone Encounter (Signed)
Requested Prescriptions  Pending Prescriptions Disp Refills  . sertraline (ZOLOFT) 100 MG tablet [Pharmacy Med Name: SERTRALINE 100 MG TAB[*]] 90 tablet 0    Sig: TAKE ONE TABLET BY MOUTH ONE TIME DAILY     Psychiatry:  Antidepressants - SSRI Passed - 02/15/2021 10:51 AM      Passed - Completed PHQ-2 or PHQ-9 in the last 360 days      Passed - Valid encounter within last 6 months    Recent Outpatient Visits          1 month ago Essential (primary) hypertension   [facility] Jerrol Banana., MD   3 months ago Essential (primary) hypertension   [facility] Jerrol Banana., MD   6 months ago Essential (primary) hypertension   [facility] Jerrol Banana., MD   7 months ago Chronic idiopathic constipation   [facility] Jerrol Banana., MD   8 months ago Small bowel obstruction due to adhesions [facility])   [facility] Jerrol Banana., MD      Future Appointments            In 1 week Jerrol Banana., MD [facility]

## 2021-02-20 ENCOUNTER — Other Ambulatory Visit: Payer: Self-pay | Admitting: Family Medicine

## 2021-02-20 DIAGNOSIS — I10 Essential (primary) hypertension: Secondary | ICD-10-CM

## 2021-02-20 NOTE — Telephone Encounter (Signed)
  Notes to clinic:  Review for continued use Looks like bp was not controlled and Clonidine was increased    Requested Prescriptions  Pending Prescriptions Disp Refills   amLODipine (NORVASC) 10 MG tablet [Pharmacy Med Name: AMLODIPINE 10 MG TAB[*]] 90 tablet 0    Sig: TAKE ONE TABLET BY MOUTH ONE TIME DAILY     Cardiovascular:  Calcium Channel Blockers Failed - 02/20/2021  2:59 PM      Failed - Last BP in normal range    BP Readings from Last 1 Encounters:  12/23/20 (!) 146/84          Passed - Valid encounter within last 6 months    Recent Outpatient Visits           1 month ago Essential (primary) hypertension   [facility] Jerrol Banana., MD   3 months ago Essential (primary) hypertension   [facility] Jerrol Banana., MD   6 months ago Essential (primary) hypertension   [facility] Jerrol Banana., MD   7 months ago Chronic idiopathic constipation   [facility] Jerrol Banana., MD   8 months ago Small bowel obstruction due to adhesions [facility])   [facility] Jerrol Banana., MD       Future Appointments             In 3 days Jerrol Banana., MD [facility], [facility]

## 2021-02-23 ENCOUNTER — Encounter: Payer: Self-pay | Admitting: Family Medicine

## 2021-02-23 ENCOUNTER — Ambulatory Visit (INDEPENDENT_AMBULATORY_CARE_PROVIDER_SITE_OTHER): Payer: 59 | Admitting: Family Medicine

## 2021-02-23 ENCOUNTER — Other Ambulatory Visit: Payer: Self-pay

## 2021-02-23 VITALS — BP 138/72 | HR 60 | Temp 98.2°F | Resp 18 | Wt 314.0 lb

## 2021-02-23 DIAGNOSIS — E291 Testicular hypofunction: Secondary | ICD-10-CM

## 2021-02-23 DIAGNOSIS — G4733 Obstructive sleep apnea (adult) (pediatric): Secondary | ICD-10-CM | POA: Diagnosis not present

## 2021-02-23 DIAGNOSIS — K219 Gastro-esophageal reflux disease without esophagitis: Secondary | ICD-10-CM | POA: Diagnosis not present

## 2021-02-23 DIAGNOSIS — Z6841 Body Mass Index (BMI) 40.0 and over, adult: Secondary | ICD-10-CM

## 2021-02-23 DIAGNOSIS — I10 Essential (primary) hypertension: Secondary | ICD-10-CM

## 2021-02-23 DIAGNOSIS — F3341 Major depressive disorder, recurrent, in partial remission: Secondary | ICD-10-CM

## 2021-02-23 NOTE — Progress Notes (Signed)
I,Roshena L Chambers,acting as a scribe for [name], MD.,have documented all relevant documentation on the behalf of [name], MD,as directed by  [name], MD while in the presence of [name], MD.     Established patient visit   Patient: Peter [name]   DOB: 01/28/1961   60 y.o. Male  MRN: [id number] Visit Date: 02/23/2021  Today's healthcare provider: [name], MD   Chief Complaint  Patient presents with   Hypertension   Subjective  -------------------------------------------------------------------------------------------------------------------- HPI  Hypertension, follow-up  BP Readings from Last 3 Encounters:  02/23/21 138/72  12/23/20 (!) 146/84  10/27/20 (!) 145/76   Wt Readings from Last 3 Encounters:  02/23/21 (!) 314 lb (142.4 kg)  12/23/20 (!) 311 lb (141.1 kg)  10/27/20 (!) 310 lb (140.6 kg)     He was last seen for hypertension on 12/23/2020.  BP at that visit was 146/84. Management since that visit includes increasing clonidine from 0.1 to 0.2 mg twice daily. Labs were ordered showing low potassium levels. Patient was started on Potassium supplement 61mg daily.  He reports good compliance with treatment. He is not having side effects.  He is following a Regular diet. He is not exercising. He does not smoke.  Use of agents associated with hypertension: NSAIDS.   Outside blood pressures are not checked. Symptoms: No chest pain No chest pressure  No palpitations No syncope  No dyspnea No orthopnea  No paroxysmal nocturnal dyspnea No lower extremity edema   Pertinent labs: Lab Results  Component Value Date   CHOL 152 12/24/2020   HDL 73 12/24/2020   LDLCALC 59 12/24/2020   TRIG 112 12/24/2020   CHOLHDL 2.1 12/24/2020   Lab Results  Component Value Date   NA 144 12/24/2020   K 3.3 (L) 12/24/2020   CREATININE 1.00 12/24/2020   GFRNONAA >60 06/03/2020   GFRAA 103 03/25/2020   GLUCOSE 89  12/24/2020     The 10-year ASCVD risk score (Mikey BussingDC Jr., et al., 2013) is: 8.4%   ---------------------------------------------------------------------------------------------------       Medications: Outpatient Medications Prior to Visit  Medication Sig   ALPRAZolam (XANAX) 0.5 MG tablet TAKE 1 TABLET(0.5 MG) BY MOUTH TWICE DAILY AS NEEDED FOR ANXIETY   amLODipine (NORVASC) 10 MG tablet TAKE ONE TABLET BY MOUTH ONE TIME DAILY   aspirin EC 81 MG tablet Take 81 mg by mouth daily.   atorvastatin (LIPITOR) 40 MG tablet TAKE 1 TABLET DAILY   azelastine (ASTELIN) 0.1 % nasal spray SMARTSIG:1-2 Spray(s) Both Nares Twice Daily   b complex vitamins capsule Take 1 capsule by mouth daily.   carbamazepine (TEGRETOL) 200 MG tablet TAKE 3 TABLETS BY MOUTH EVERY MORNING AND 2 TABLETS BY MOUTH EVERY NIGHT AT BEDTIME   chlorthalidone (HYGROTON) 25 MG tablet Take 1 tablet (25 mg total) by mouth daily.   Cholecalciferol (NAT-RUL VITAMIN D) 25 MCG (1000 UT) tablet Take 1,000 Units by mouth daily.   cloNIDine (CATAPRES) 0.2 MG tablet Take 1 tablet (0.2 mg total) by mouth 2 (two) times daily.   hydrALAZINE (APRESOLINE) 100 MG tablet TAKE 1 TABLET THREE TIMES A DAY   lamoTRIgine (LAMICTAL) 200 MG tablet Take 1 tablet (200 mg total) by mouth 2 (two) times daily.   latanoprost (XALATAN) 0.005 % ophthalmic solution Place 1 drop into both eyes at bedtime.    losartan (COZAAR) 100 MG tablet TAKE 1 TABLET DAILY   metoprolol tartrate (LOPRESSOR) 50 MG  tablet TAKE 1 TABLET TWICE A DAY   NON FORMULARY CPAP   Omega-3 Fatty Acids (FISH OIL) 1000 MG CAPS Take 1 capsule by mouth daily.    omeprazole (PRILOSEC) 40 MG capsule Take 1 capsule (40 mg total) by mouth daily before breakfast.   potassium chloride (KLOR-CON) 10 MEQ tablet Take 10 mEq by mouth daily.   sertraline (ZOLOFT) 100 MG tablet TAKE ONE TABLET BY MOUTH ONE TIME DAILY   triamcinolone (NASACORT) 55 MCG/ACT AERO nasal inhaler Place 2 sprays into the nose  daily.   UNABLE TO FIND 900 mg by Per post-pyloric tube route. Med Name: Kyolic   vitamin X-91 (CYANOCOBALAMIN) 1000 MCG tablet Take 1,000 mcg by mouth daily.    eplerenone (INSPRA) 50 MG tablet Take 50 mg by mouth daily. (Patient not taking: No sig reported)   NARCAN 4 MG/0.1ML LIQD nasal spray kit USE 1 SPRAY IEN UTD PRN (Patient not taking: Reported on 02/23/2021)   tamsulosin (FLOMAX) 0.4 MG CAPS capsule Take 1 capsule (0.4 mg total) by mouth daily. (Patient not taking: No sig reported)   [DISCONTINUED] montelukast (SINGULAIR) 10 MG tablet Take 10 mg by mouth at bedtime.   No facility-administered medications prior to visit.    Review of Systems  Constitutional:  Positive for fever. Negative for appetite change and chills.  Respiratory:  Negative for chest tightness, shortness of breath and wheezing.   Cardiovascular:  Negative for chest pain and palpitations.  Gastrointestinal:  Negative for abdominal pain, nausea and vomiting.       Objective  -------------------------------------------------------------------------------------------------------------------- BP 138/72 (BP Location: Left Arm, Patient Position: Sitting, Cuff Size: Large)   Pulse 60   Temp 98.2 F (36.8 C) (Temporal)   Resp 18   Wt (!) 314 lb (142.4 kg)   BMI 49.18 kg/m  BP Readings from Last 3 Encounters:  02/23/21 138/72  12/23/20 (!) 146/84  10/27/20 (!) 145/76   Wt Readings from Last 3 Encounters:  02/23/21 (!) 314 lb (142.4 kg)  12/23/20 (!) 311 lb (141.1 kg)  10/27/20 (!) 310 lb (140.6 kg)       Physical Exam Vitals reviewed.  Constitutional:      Appearance: He is well-developed.     Comments: Obese white male in no acute distress  HENT:     Head: Normocephalic and atraumatic.     Right Ear: External ear normal.     Left Ear: External ear normal.     Nose: Nose normal.  Eyes:     General: No scleral icterus.    Conjunctiva/sclera: Conjunctivae normal.  Neck:     Thyroid: No thyromegaly.      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     Heart sounds: Normal heart sounds.  Pulmonary:     Effort: Pulmonary effort is normal.     Breath sounds: Normal breath sounds.  Abdominal:     General: Bowel sounds are normal. There is no distension.     Tenderness: There is no abdominal tenderness.     Comments: Midline scar is surrounded by can very firm scar tissue probably 3 inches on either side of the scar.  Skin:    General: Skin is warm and dry.  Neurological:     General: No focal deficit present.     Mental Status: He is alert and oriented to person, place, and time.     Cranial Nerves: No cranial nerve  deficit.     Sensory: No sensory deficit.     Motor: No abnormal muscle tone.  Psychiatric:        Mood and Affect: Mood normal.        Behavior: Behavior normal.        Thought Content: Thought content normal.        Judgment: Judgment normal.      No results found for any visits on 02/23/21.  Assessment & Plan  ---------------------------------------------------------------------------------------------------------------------. 1. Essential (primary) hypertension Good control on amlodipine 10 clonidine twice a day Dralzine 3 times a day metoprolol losartan  2. Obstructive sleep apnea syndrome On CPAP  3. Gastroesophageal reflux disease, unspecified whether esophagitis present On omeprazole 40 mg daily  4. Testicular hypofunction   5. Recurrent major depressive disorder, in partial remission ([place]) Followed by psychiatry  6. Class 3 severe obesity due to excess calories with serious comorbidity and body mass index (BMI) of 45.0 to 49.9 in adult [facility]) And exercise and stress.  I think weight loss would help ability to maybe cut back on some of his medications especially  clonidine   Return in about 5 months (around 07/26/2021) for CPE.      I, [name], MD, have  reviewed all documentation for this visit. The documentation on 03/01/21 for the exam, diagnosis, procedures, and orders are all accurate and complete.    Darelle Kings Cranford Mon, MD  [facility] [phone] (phone) [phone] (fax)  [place]

## 2021-03-20 ENCOUNTER — Other Ambulatory Visit: Payer: Self-pay | Admitting: Family Medicine

## 2021-03-27 ENCOUNTER — Ambulatory Visit: Payer: 59 | Admitting: Physician Assistant

## 2021-04-02 ENCOUNTER — Other Ambulatory Visit: Payer: Self-pay | Admitting: Family Medicine

## 2021-04-02 DIAGNOSIS — I10 Essential (primary) hypertension: Secondary | ICD-10-CM

## 2021-04-18 ENCOUNTER — Other Ambulatory Visit: Payer: Self-pay | Admitting: Family Medicine

## 2021-04-18 DIAGNOSIS — E7800 Pure hypercholesterolemia, unspecified: Secondary | ICD-10-CM

## 2021-04-18 NOTE — Telephone Encounter (Signed)
Requested Prescriptions  Pending Prescriptions Disp Refills  . atorvastatin (LIPITOR) 40 MG tablet [Pharmacy Med Name: ATORVASTATIN 40 MG TAB[*]] 90 tablet 0    Sig: TAKE ONE TABLET BY MOUTH ONE TIME DAILY     Cardiovascular:  Antilipid - Statins Passed - 04/18/2021  2:53 PM      Passed - Total Cholesterol in normal range and within 360 days    Cholesterol, Total  Date Value Ref Range Status  12/24/2020 152 100 - 199 mg/dL Final         Passed - LDL in normal range and within 360 days    LDL Cholesterol (Calc)  Date Value Ref Range Status  06/01/2017 50 mg/dL (calc) Final    Comment:    Reference range: <100 . Desirable range <100 mg/dL for primary prevention;   <70 mg/dL for patients with CHD or diabetic patients  with > or = 2 CHD risk factors. Marland Kitchen LDL-C is now calculated using the Martin-Hopkins  calculation, which is a validated novel method providing  better accuracy than the Friedewald equation in the  estimation of LDL-C.  Cresenciano Genre et al. Annamaria Helling. 4462;863(81): 2061-2068  (http://education.[employer].com/faq/FAQ164)    LDL Chol Calc (NIH)  Date Value Ref Range Status  12/24/2020 59 0 - 99 mg/dL Final         Passed - HDL in normal range and within 360 days    HDL  Date Value Ref Range Status  12/24/2020 73 >39 mg/dL Final         Passed - Triglycerides in normal range and within 360 days    Triglycerides  Date Value Ref Range Status  12/24/2020 112 0 - 149 mg/dL Final         Passed - Patient is not pregnant      Passed - Valid encounter within last 12 months    Recent Outpatient Visits          1 month ago Essential (primary) hypertension   [place] Jerrol Banana., MD   3 months ago Essential (primary) hypertension   [facility] Jerrol Banana., MD   5 months ago Essential (primary) hypertension   [facility] Jerrol Banana., MD   8 months ago Essential (primary) hypertension    [facility] Jerrol Banana., MD   9 months ago Chronic idiopathic constipation   [facility] Jerrol Banana., MD      Future Appointments            In 2 months Jerrol Banana., MD [facility], [place]

## 2021-04-23 ENCOUNTER — Other Ambulatory Visit: Payer: Self-pay | Admitting: Family Medicine

## 2021-04-23 DIAGNOSIS — I10 Essential (primary) hypertension: Secondary | ICD-10-CM

## 2021-04-24 NOTE — Telephone Encounter (Signed)
Requested Prescriptions  Pending Prescriptions Disp Refills  . cloNIDine (CATAPRES) 0.2 MG tablet [Pharmacy Med Name: CLONIDINE 0.2 MG TAB[*]] 60 tablet 3    Sig: TAKE ONE TABLET BY MOUTH TWICE A DAY     Cardiovascular:  Alpha-2 Agonists Passed - 04/23/2021  6:08 PM      Passed - Last BP in normal range    BP Readings from Last 1 Encounters:  02/23/21 138/72         Passed - Last Heart Rate in normal range    Pulse Readings from Last 1 Encounters:  02/23/21 60         Passed - Valid encounter within last 6 months    Recent Outpatient Visits          2 months ago Essential (primary) hypertension   [facility] Jerrol Banana., MD   4 months ago Essential (primary) hypertension   [facility] Jerrol Banana., MD   5 months ago Essential (primary) hypertension   [facility] Jerrol Banana., MD   8 months ago Essential (primary) hypertension   [facility] Jerrol Banana., MD   9 months ago Chronic idiopathic constipation   [facility] Jerrol Banana., MD      Future Appointments            In 2 months Jerrol Banana., MD [facility], [place]

## 2021-05-08 ENCOUNTER — Ambulatory Visit: Payer: 59 | Admitting: Physician Assistant

## 2021-05-14 ENCOUNTER — Other Ambulatory Visit: Payer: Self-pay | Admitting: Family Medicine

## 2021-05-14 DIAGNOSIS — I10 Essential (primary) hypertension: Secondary | ICD-10-CM

## 2021-05-15 ENCOUNTER — Other Ambulatory Visit: Payer: Self-pay

## 2021-05-15 ENCOUNTER — Ambulatory Visit (INDEPENDENT_AMBULATORY_CARE_PROVIDER_SITE_OTHER): Payer: 59 | Admitting: Physician Assistant

## 2021-05-15 ENCOUNTER — Encounter: Payer: Self-pay | Admitting: Physician Assistant

## 2021-05-15 DIAGNOSIS — G4700 Insomnia, unspecified: Secondary | ICD-10-CM

## 2021-05-15 DIAGNOSIS — F411 Generalized anxiety disorder: Secondary | ICD-10-CM

## 2021-05-15 DIAGNOSIS — F319 Bipolar disorder, unspecified: Secondary | ICD-10-CM | POA: Diagnosis not present

## 2021-05-15 DIAGNOSIS — R5383 Other fatigue: Secondary | ICD-10-CM

## 2021-05-15 MED ORDER — MODAFINIL 200 MG PO TABS: 100.0000 mg | ORAL_TABLET | Freq: Every day | ORAL | 1 refills | Status: DC

## 2021-05-15 NOTE — Progress Notes (Signed)
[facility] Med Check  Patient ID: Peter [name],  MRN: [id number]  PCP: Jerrol Banana., MD  Date of Evaluation: 05/15/2021 Time spent:40 minutes  Chief Complaint:  Chief Complaint   Depression; Anxiety; Insomnia; Follow-up       HISTORY/CURRENT STATUS: for routine med check.    He is doing well except for low energy and motivation.  We had discussed adding modafinil at the last visit but at that time we increased the Lamictal.  We both did not want to make any other changes.  He is doing well as far as mood goes.  Able to enjoy things.  Not isolating.  Work is going well although he really does not like where he is at right now, does not cry easily.  No suicidal or homicidal thoughts.  Patient denies increased energy with decreased need for sleep, no increased talkativeness, no racing thoughts, no impulsivity or risky behaviors, no increased spending, no increased libido, no grandiosity, and no hallucinations.  Still gets anxious, Xanax is helpful.  Has more generalized anxiety than panic attacks.  He sleeps well most of the time.   Denies dizziness, syncope, seizures, numbness, tingling, tremor, tics, unsteady gait, slurred speech, confusion. Denies muscle or joint pain, stiffness, or dystonia. Denies unexplained weight loss, frequent infections, or sores that heal slowly.  No polyphagia, polydipsia, or polyuria. Denies visual changes or paresthesias.   Individual Medical History/ Review of Systems: Changes? :No    Past medications for mental health diagnoses include: Zoloft, Cymbalta did not help, Pristiq did not help, Seroquel, Xanax, Depakote, Lamictal, Equetro, Klonopin  Allergies: Oxycodone and Isosorbide  Current Medications:  Current Outpatient Medications:    ALPRAZolam (XANAX) 0.5 MG tablet, TAKE 1 TABLET(0.5 MG) BY MOUTH TWICE DAILY AS NEEDED FOR ANXIETY, Disp: 60 tablet, Rfl: 1   amLODipine (NORVASC) 10 MG tablet, TAKE ONE TABLET BY MOUTH ONE TIME  DAILY, Disp: 90 tablet, Rfl: 0   aspirin EC 81 MG tablet, Take 81 mg by mouth daily., Disp: , Rfl:    atorvastatin (LIPITOR) 40 MG tablet, TAKE ONE TABLET BY MOUTH ONE TIME DAILY, Disp: 90 tablet, Rfl: 0   azelastine (ASTELIN) 0.1 % nasal spray, SMARTSIG:1-2 Spray(s) Both Nares Twice Daily, Disp: , Rfl:    b complex vitamins capsule, Take 1 capsule by mouth daily., Disp: 30 capsule, Rfl: 11   carbamazepine (TEGRETOL) 200 MG tablet, TAKE 3 TABLETS BY MOUTH EVERY MORNING AND 2 TABLETS BY MOUTH EVERY NIGHT AT BEDTIME, Disp: 450 tablet, Rfl: 3   Cholecalciferol (NAT-RUL VITAMIN D) 25 MCG (1000 UT) tablet, Take 1,000 Units by mouth daily., Disp: , Rfl:    cloNIDine (CATAPRES) 0.2 MG tablet, TAKE ONE TABLET BY MOUTH TWICE A DAY, Disp: 60 tablet, Rfl: 3   hydrALAZINE (APRESOLINE) 100 MG tablet, TAKE ONE TABLET BY MOUTH THREE TIMES A DAY, Disp: 270 tablet, Rfl: 1   lamoTRIgine (LAMICTAL) 200 MG tablet, Take 1 tablet (200 mg total) by mouth 2 (two) times daily., Disp: 60 tablet, Rfl: 5   latanoprost (XALATAN) 0.005 % ophthalmic solution, Place 1 drop into both eyes at bedtime. , Disp: , Rfl:    losartan (COZAAR) 100 MG tablet, TAKE ONE TABLET BY MOUTH ONE TIME DAILY, Disp: 90 tablet, Rfl: 1   metoprolol tartrate (LOPRESSOR) 50 MG tablet, TAKE ONE TABLET BY MOUTH TWICE A DAY, Disp: 180 tablet, Rfl: 1   modafinil (PROVIGIL) 200 MG tablet, Take 0.5 tablets (100 mg total) by mouth daily., Disp: 30 tablet, Rfl: 1  NON FORMULARY, CPAP, Disp: , Rfl:    Omega-3 Fatty Acids (FISH OIL) 1000 MG CAPS, Take 1 capsule by mouth daily. , Disp: , Rfl:    omeprazole (PRILOSEC) 40 MG capsule, Take 1 capsule (40 mg total) by mouth daily before breakfast., Disp: 30 capsule, Rfl: 3   potassium chloride (KLOR-CON) 10 MEQ tablet, Take 10 mEq by mouth daily., Disp: , Rfl:    sertraline (ZOLOFT) 100 MG tablet, TAKE ONE TABLET BY MOUTH ONE TIME DAILY, Disp: 90 tablet, Rfl: 0   triamcinolone (NASACORT) 55 MCG/ACT AERO nasal inhaler,  Place 2 sprays into the nose daily., Disp: 1 each, Rfl: 12   vitamin B-12 (CYANOCOBALAMIN) 1000 MCG tablet, Take 1,000 mcg by mouth daily. , Disp: , Rfl:    chlorthalidone (HYGROTON) 25 MG tablet, TAKE ONE TABLET BY MOUTH ONE TIME DAILY, Disp: 30 tablet, Rfl: 1   eplerenone (INSPRA) 50 MG tablet, Take 50 mg by mouth daily. (Patient not taking: No sig reported), Disp: , Rfl:    NARCAN 4 MG/0.1ML LIQD nasal spray kit, USE 1 SPRAY IEN UTD PRN (Patient not taking: Reported on 02/23/2021), Disp: , Rfl:    tamsulosin (FLOMAX) 0.4 MG CAPS capsule, Take 1 capsule (0.4 mg total) by mouth daily. (Patient not taking: No sig reported), Disp: 30 capsule, Rfl: 5   UNABLE TO FIND, 900 mg by Per post-pyloric tube route. Med Name: [name] (Patient not taking: Reported on 05/15/2021), Disp: , Rfl:  Medication Side Effects: none  Family Medical/ Social History: Changes? No  MENTAL HEALTH EXAM:  There were no vitals taken for this visit.There is no height or weight on file to calculate BMI.  General Appearance: Casual, Neat, Well Groomed and Obese  Eye Contact:  Good  Speech:  Clear and Coherent and Normal Rate  Volume:  Normal  Mood:  Euthymic  Affect:  Appropriate  Thought Process:  Goal Directed and Descriptions of Associations: Circumstantial  Orientation:  Full (Time, Place, and Person)  Thought Content: Logical   Suicidal Thoughts:  No  Homicidal Thoughts:  No  Memory:  WNL  Judgement:  Good  Insight:  Good  Psychomotor Activity:  Normal  Concentration:  Concentration: Good and Attention Span: Good  Recall:  Good  Fund of Knowledge: Good  Language: Good  Assets:  Desire for Improvement  ADL's:  Intact  Cognition: WNL  Prognosis:  Good   Most recent pertinent labs: 12/24/2020 Carbamazepine level 9.4 CBC was normal Potassium was low at 3.3, otherwise normal CMP. TSH normal.  Hemoglobin A1c 5.0 Lipid panel completely normal  (05/15/2021.  No more recent labs than these.)  DIAGNOSES:     ICD-10-CM   1. Bipolar I disorder ([place])  F31.9     2. Fatigue, unspecified type  R53.83     3. Generalized anxiety disorder  F41.1     4. Insomnia, unspecified type  G47.00         Receiving Psychotherapy: No    RECOMMENDATIONS:  PDMP was reviewed.  Last Xanax filled 03/27/2021.   I provided 30 minutes of face to face time during this encounter, including time spent before and after the visit in records review, medical decision making, and charting.  We again discussed modafinil, benefits, risks and side effects and he would like to try it.  As long as he is doing well with it and has no other problems we can spread visits out to 6 months again.  He knows to contact me if he has any  problems. Start modafinil 200 mg, one half p.o. every morning as needed. Continue Xanax 0.5 mg, 1 p.o. twice daily as needed. Continue carbamazepine 200 mg, 3 p.o. every morning, 2 p.o. nightly. Continue Lamictal 200 mg 1 p.o. twice daily.  Continue Zoloft 100 mg, 1 p.o. twice daily. (per Dr. Rosanna Randy) Continue multivitamin, B complex, omega-3, vitamin D per med sheet. Return in 6 months.Donnal Moat, PA-C

## 2021-05-15 NOTE — Telephone Encounter (Signed)
Requested Prescriptions  Pending Prescriptions Disp Refills  . sertraline (ZOLOFT) 100 MG tablet [Pharmacy Med Name: SERTRALINE 100 MG TAB[*]] 90 tablet 0    Sig: TAKE ONE TABLET BY MOUTH ONE TIME DAILY     Psychiatry:  Antidepressants - SSRI Passed - 05/14/2021  6:04 PM      Passed - Completed PHQ-2 or PHQ-9 in the last 360 days      Passed - Valid encounter within last 6 months    Recent Outpatient Visits          2 months ago Essential (primary) hypertension   [facility] Jerrol Banana., MD   4 months ago Essential (primary) hypertension   [facility] Jerrol Banana., MD   6 months ago Essential (primary) hypertension   [facility] Jerrol Banana., MD   9 months ago Essential (primary) hypertension   [facility] Jerrol Banana., MD   10 months ago Chronic idiopathic constipation   [facility] Jerrol Banana., MD      Future Appointments            In 2 months Jerrol Banana., MD [facility], [place]           . amLODipine (NORVASC) 10 MG tablet [Pharmacy Med Name: AMLODIPINE 10 MG TAB[*]] 90 tablet 0    Sig: TAKE ONE TABLET BY MOUTH ONE TIME DAILY     Cardiovascular:  Calcium Channel Blockers Passed - 05/14/2021  6:04 PM      Passed - Last BP in normal range    BP Readings from Last 1 Encounters:  02/23/21 138/72         Passed - Valid encounter within last 6 months    Recent Outpatient Visits          2 months ago Essential (primary) hypertension   [facility] Jerrol Banana., MD   4 months ago Essential (primary) hypertension   [facility] Jerrol Banana., MD   6 months ago Essential (primary) hypertension   [facility] Jerrol Banana., MD   9 months ago Essential (primary) hypertension   [facility] Jerrol Banana., MD   10 months ago Chronic  idiopathic constipation   [facility] Jerrol Banana., MD      Future Appointments            In 2 months Jerrol Banana., MD [facility], [place]

## 2021-05-21 ENCOUNTER — Other Ambulatory Visit: Payer: Self-pay | Admitting: Family Medicine

## 2021-05-21 DIAGNOSIS — I10 Essential (primary) hypertension: Secondary | ICD-10-CM

## 2021-05-22 NOTE — Telephone Encounter (Signed)
Requested Prescriptions  Pending Prescriptions Disp Refills  . chlorthalidone (HYGROTON) 25 MG tablet [Pharmacy Med Name: CHLORTHALIDONE 25 MG TAB[*]] 30 tablet 1    Sig: TAKE ONE TABLET BY MOUTH ONE TIME DAILY     Cardiovascular: Diuretics - Thiazide Failed - 05/21/2021  5:36 PM      Failed - K in normal range and within 360 days    Potassium  Date Value Ref Range Status  12/24/2020 3.3 (L) 3.5 - 5.2 mmol/L Final         Passed - Ca in normal range and within 360 days    Calcium  Date Value Ref Range Status  12/24/2020 8.9 8.6 - 10.2 mg/dL Final         Passed - Cr in normal range and within 360 days    Creat  Date Value Ref Range Status  06/21/2017 0.83 0.70 - 1.33 mg/dL Final    Comment:    For patients >31 years of age, the reference limit for Creatinine is approximately 13% higher for people identified as African-American. .    Creatinine, Ser  Date Value Ref Range Status  12/24/2020 1.00 0.76 - 1.27 mg/dL Final         Passed - Na in normal range and within 360 days    Sodium  Date Value Ref Range Status  12/24/2020 144 134 - 144 mmol/L Final         Passed - Last BP in normal range    BP Readings from Last 1 Encounters:  02/23/21 138/72         Passed - Valid encounter within last 6 months    Recent Outpatient Visits          2 months ago Essential (primary) hypertension   [facility] Jerrol Banana., MD   5 months ago Essential (primary) hypertension   [facility] Jerrol Banana., MD   6 months ago Essential (primary) hypertension   [facility] Jerrol Banana., MD   9 months ago Essential (primary) hypertension   [facility] Jerrol Banana., MD   10 months ago Chronic idiopathic constipation   [facility] Jerrol Banana., MD      Future Appointments            In 1 month Jerrol Banana., MD [facility], [place]

## 2021-06-19 ENCOUNTER — Other Ambulatory Visit: Payer: Self-pay | Admitting: Family Medicine

## 2021-06-19 DIAGNOSIS — I10 Essential (primary) hypertension: Secondary | ICD-10-CM

## 2021-07-16 ENCOUNTER — Encounter: Payer: 59 | Admitting: Family Medicine

## 2021-07-17 ENCOUNTER — Other Ambulatory Visit: Payer: Self-pay | Admitting: Family Medicine

## 2021-07-17 DIAGNOSIS — I10 Essential (primary) hypertension: Secondary | ICD-10-CM

## 2021-07-17 DIAGNOSIS — E7800 Pure hypercholesterolemia, unspecified: Secondary | ICD-10-CM

## 2021-07-17 NOTE — Telephone Encounter (Signed)
Requested Prescriptions  Pending Prescriptions Disp Refills   atorvastatin (LIPITOR) 40 MG tablet [Pharmacy Med Name: ATORVASTATIN 40 MG TABLET] 90 tablet 0    Sig: TAKE ONE TABLET BY MOUTH ONE TIME DAILY     Cardiovascular:  Antilipid - Statins Passed - 07/17/2021 12:05 PM      Passed - Total Cholesterol in normal range and within 360 days    Cholesterol, Total  Date Value Ref Range Status  12/24/2020 152 100 - 199 mg/dL Final         Passed - LDL in normal range and within 360 days    LDL Cholesterol (Calc)  Date Value Ref Range Status  06/01/2017 50 mg/dL (calc) Final    Comment:    Reference range: <100 . Desirable range <100 mg/dL for primary prevention;   <70 mg/dL for patients with CHD or diabetic patients  with > or = 2 CHD risk factors. Marland Kitchen LDL-C is now calculated using the Martin-Hopkins  calculation, which is a validated novel method providing  better accuracy than the Friedewald equation in the  estimation of LDL-C.  Cresenciano Genre et al. Annamaria Helling. 8676;720(94): 2061-2068  (http://education.[facility].com/faq/FAQ164)    LDL Chol Calc (NIH)  Date Value Ref Range Status  12/24/2020 59 0 - 99 mg/dL Final         Passed - HDL in normal range and within 360 days    HDL  Date Value Ref Range Status  12/24/2020 73 >39 mg/dL Final         Passed - Triglycerides in normal range and within 360 days    Triglycerides  Date Value Ref Range Status  12/24/2020 112 0 - 149 mg/dL Final         Passed - Patient is not pregnant      Passed - Valid encounter within last 12 months    Recent Outpatient Visits          4 months ago Essential (primary) hypertension   [facility] Jerrol Banana., MD   6 months ago Essential (primary) hypertension   [facility] Jerrol Banana., MD   8 months ago Essential (primary) hypertension   [facility] Jerrol Banana., MD   11 months ago Essential (primary) hypertension    [facility] Jerrol [facility]., [facility]   1 year ago Chronic idiopathic constipation   [facility] Jerrol Banana., MD      Future Appointments            In 3 months Jerrol Banana., MD [facility]            chlorthalidone (HYGROTON) 25 MG tablet [Pharmacy Med Name: CHLORTHALIDONE 25 MG TAB[*]] 90 tablet 0    Sig: TAKE ONE TABLET BY MOUTH ONE TIME DAILY     Cardiovascular: Diuretics - Thiazide Failed - 07/17/2021 12:05 PM      Failed - K in normal range and within 360 days    Potassium  Date Value Ref Range Status  12/24/2020 3.3 (L) 3.5 - 5.2 mmol/L Final         Passed - Ca in normal range and within 360 days    Calcium  Date Value Ref Range Status  12/24/2020 8.9 8.6 - 10.2 mg/dL Final         Passed - Cr in normal range and within 360 days    Creat  Date Value Ref Range Status  06/21/2017 0.83 0.70 - 1.33 mg/dL Final    Comment:    For patients >50 years of age, the reference limit for Creatinine is approximately 13% higher for people identified as African-American. .    Creatinine, Ser  Date Value Ref Range Status  12/24/2020 1.00 0.76 - 1.27 mg/dL Final         Passed - Na in normal range and within 360 days    Sodium  Date Value Ref Range Status  12/24/2020 144 134 - 144 mmol/L Final         Passed - Last BP in normal range    BP Readings from Last 1 Encounters:  02/23/21 138/72         Passed - Valid encounter within last 6 months    Recent Outpatient Visits          4 months ago Essential (primary) hypertension   [facility] Jerrol Banana., [facility]   6 months ago Essential (primary) hypertension   [facility] Jerrol Banana., MD   8 months ago Essential (primary) hypertension   [facility] Jerrol Banana., MD   11 months ago Essential (primary) hypertension   [facility] Jerrol Banana., MD    1 year ago Chronic idiopathic constipation   [facility] Jerrol Banana., MD      Future Appointments            In 3 months Jerrol Banana., MD [facility] [place]

## 2021-07-23 ENCOUNTER — Other Ambulatory Visit: Payer: Self-pay | Admitting: Family Medicine

## 2021-07-23 ENCOUNTER — Other Ambulatory Visit: Payer: Self-pay | Admitting: Physician Assistant

## 2021-07-23 DIAGNOSIS — I10 Essential (primary) hypertension: Secondary | ICD-10-CM

## 2021-07-23 NOTE — Telephone Encounter (Signed)
Requested Prescriptions  Pending Prescriptions Disp Refills   sertraline (ZOLOFT) 100 MG tablet [Pharmacy Med Name: SERTRALINE 100 MG TAB] 90 tablet 0    Sig: TAKE ONE TABLET BY MOUTH ONE TIME DAILY     Psychiatry:  Antidepressants - SSRI Passed - 07/23/2021  5:49 PM      Passed - Completed PHQ-2 or PHQ-9 in the last 360 days      Passed - Valid encounter within last 6 months    Recent Outpatient Visits          5 months ago Essential (primary) hypertension   [facility] Jerrol Banana., MD   7 months ago Essential (primary) hypertension   [facility] Jerrol Banana., MD   8 months ago Essential (primary) hypertension   [facility] Jerrol Ban[facility]., MD   11 months ago Essential (primary) hypertension   [facility]   1 year ago Chronic idiopathic constipation   [facility] Jerrol Banana., MD      Future Appointments            In 3 months Jerrol Banana., MD [facility]            amLODipine (NORVASC) 10 MG tablet [Pharmacy Med Name: AMLODIPINE 10 MG TAB[*]] 90 tablet 0    Sig: TAKE ONE TABLET BY MOUTH ONE TIME DAILY     Cardiovascular:  Calcium Channel Blockers Passed - 07/23/2021  5:49 PM      Passed - Last BP in normal range    BP Readings from Last 1 Encounters:  02/23/21 138/72         Passed - Valid encounter within last 6 months    Recent Outpatient Visits          5 months ago Essential (primary) hypertension   [facility] Jerrol Banana., MD   7 months ago Essential (primary) hypertension   [facility] Jerrol Banana., MD   8 months ago Essential (primary) hypertension   [facility] Jerrol Banana., MD   11 months ago Essential (primary) hypertension   [facility] Jerrol Banana., MD   1 year ago Chronic idiopathic  constipation   [facility] Jerrol Banana., MD      Future Appointments            In 3 months Jerrol Banana., MD [facility] [place]

## 2021-08-17 ENCOUNTER — Other Ambulatory Visit: Payer: Self-pay | Admitting: Family Medicine

## 2021-08-17 ENCOUNTER — Other Ambulatory Visit: Payer: Self-pay | Admitting: Physician Assistant

## 2021-08-17 DIAGNOSIS — I10 Essential (primary) hypertension: Secondary | ICD-10-CM

## 2021-09-19 ENCOUNTER — Other Ambulatory Visit: Payer: Self-pay | Admitting: Family Medicine

## 2021-10-01 ENCOUNTER — Other Ambulatory Visit: Payer: Self-pay | Admitting: Family Medicine

## 2021-10-01 DIAGNOSIS — I10 Essential (primary) hypertension: Secondary | ICD-10-CM

## 2021-10-01 DIAGNOSIS — E7800 Pure hypercholesterolemia, unspecified: Secondary | ICD-10-CM

## 2021-10-02 NOTE — Telephone Encounter (Signed)
Requested medication (s) are due for refill today- no  ? ?Requested medication (s) are on the active medication list -yes ? ?Future visit scheduled -yes ? ?Last refill: 07/23/21 #90  ? ?Notes to clinic: Request RF: non delegated Rx ? ?Requested Prescriptions  ?Pending Prescriptions Disp Refills  ? sertraline (ZOLOFT) 100 MG tablet [Pharmacy Med Name: SERTRALINE 100 MG TAB[*]] 90 tablet 0  ?  Sig: TAKE ONE TABLET BY MOUTH ONE TIME DAILY  ?  ? Not Delegated - Psychiatry:  Antidepressants - SSRI - sertraline Failed - 10/01/2021 12:42 PM  ?  ?  Failed - This refill cannot be delegated  ?  ?  Failed - Valid encounter within last 6 months  ?  Recent Outpatient Visits   ? ?      ? 7 months ago Essential (primary) hypertension  ? [facility] Jerrol Banana., MD  ? 9 months ago Essential (primary) hypertension  ? [facility] Jerrol Banana., MD  ? 11 months ago Essential (primary) hypertension  ? [facility] Jerrol Banana., MD  ? 1 year ago Essential (primary) hypertension  ? [facility] Jerrol Banana., MD  ? 1 year ago Chronic idiopathic constipation  ? [facility] Jerrol Banana., MD  ? ?  ?  ?Future Appointments   ? ?        ? In 2 weeks Jerrol Banana., MD [facility]  ? ?  ? ?  ?  ?  Passed - AST in normal range and within 360 days  ?  AST  ?Date Value Ref Range Status  ?12/24/2020 23 0 - 40 IU/L Final  ?  ?  ?  ?  Passed - ALT in normal range and within 360 days  ?  ALT  ?Date Value Ref Range Status  ?12/24/2020 19 0 - 44 IU/L Final  ?  ?  ?  ?  Passed - Completed PHQ-2 or PHQ-9 in the last 360 days  ?  ?  ?Signed Prescriptions Disp Refills  ? hydrALAZINE (APRESOLINE) 100 MG tablet 270 tablet 1  ?  Sig: TAKE ONE TABLET BY MOUTH THREE TIMES A DAY  ?  ? Cardiovascular:  Vasodilators Failed - 10/01/2021 12:42 PM  ?  ?  Failed - ANA Screen, Ifa, Serum in normal range and within 360 days  ?   No results found for: ANA, ANATITER, LABANTI  ?  ?  ?  Passed - HCT in normal range and within 360 days  ?  Hematocrit  ?Date Value Ref Range Status  ?12/24/2020 41.8 37.5 - 51.0 % Final  ?  ?  ?  ?  Passed - HGB in normal range and within 360 days  ?  Hemoglobin  ?Date Value Ref Range Status  ?12/24/2020 14.2 13.0 - 17.7 g/dL Final  ?  ?  ?  ?  Passed - RBC in normal range and within 360 days  ?  RBC  ?Date Value Ref Range Status  ?12/24/2020 4.82 4.14 - 5.80 x10E6/uL Final  ?05/31/2020 4.69 4.22 - 5.81 MIL/uL Final  ?  ?  ?  ?  Passed - WBC in normal range and within 360 days  ?  WBC  ?Date Value Ref Range Status  ?12/24/2020 6.5 3.4 - 10.8 x10E3/uL Final  ?05/31/2020 7.2 4.0 - 10.5 K/uL Final  ?  ?  ?  ?  Passed -  PLT in normal range and within 360 days  ?  Platelets  ?Date Value Ref Range Status  ?12/24/2020 207 150 - 450 x10E3/uL Final  ?  ?  ?  ?  Passed - Last BP in normal range  ?  BP Readings from Last 1 Encounters:  ?02/23/21 138/72  ?  ?  ?  ?  Passed - Valid encounter within last 12 months  ?  Recent Outpatient Visits   ? ?      ? 7 months ago Essential (primary) hypertension  ? [facility] Jerrol Banana., MD  ? 9 months ago Essential (primary) hypertension  ? [facility] Jerrol Ban[facility]., MD  ? 11 months ago Essential (primary) hypertension  ? [facility] Jerrol Banana., MD  ? 1 year ago Essential (primary) hypertension  ? [facility] Jerrol Banana., [facility]  ? 1 year ago Chronic idiopathic constipation  ? [facility] Jerrol Banana., MD  ? ?  ?  ?Future Appointments   ? ?        ? In 2 weeks Jerrol Banana., MD [facility]  ? ?  ? ?  ?  ?  ?Refused Prescriptions Disp Refills  ? chlorthalidone (HYGROTON) 25 MG tablet [Pharmacy Med Name: CHLORTHALIDONE 25 MG TAB[*]] 90 tablet 0  ?  Sig: TAKE ONE TABLET BY MOUTH ONE TIME DAILY  ?  ? Cardiovascular: Diuretics - Thiazide  Failed - 10/01/2021 12:42 PM  ?  ?  Failed - Cr in normal range and within 180 days  ?  Creat  ?Date Value Ref Range Status  ?06/21/2017 0.83 0.70 - 1.33 mg/dL Final  ?  Comment:  ?  For patients >59 years of age, the reference limit ?for Creatinine is approximately 13% higher for people ?identified as African-American. ?. ?  ? ?Creatinine, Ser  ?Date Value Ref Range Status  ?12/24/2020 1.00 0.76 - 1.27 mg/dL Final  ?  ?  ?  ?  Failed - K in normal range and within 180 days  ?  Potassium  ?Date Value Ref Range Status  ?12/24/2020 3.3 (L) 3.5 - 5.2 mmol/L Final  ?  ?  ?  ?  Failed - Na in normal range and within 180 days  ?  Sodium  ?Date Value Ref Range Status  ?12/24/2020 144 134 - 144 mmol/L Final  ?  ?  ?  ?  Failed - Valid encounter within last 6 months  ?  Recent Outpatient Visits   ? ?      ? 7 months ago Essential (primary) hypertension  ? [facility] Jer[facility] Ban[facility]., [facility]  ? 9 months ago Essential (primary) hypertension  ? [facility] Jerrol Banana., MD  ? 11 months ago Essential (primary) hypertension  ? [facility] Jerrol Banana., MD  ? 1 year ago Essential (primary) hypertension  ? [facility] Jerrol Banana., MD  ? 1 year ago Chronic idiopathic constipation  ? [facility] Jerrol Banana., MD  ? ?  ?  ?Future Appointments   ? ?        ? In 2 weeks Jerrol Banana., MD [facility]  ? ?  ? ?  ?  ?  Passed - Last BP in normal range  ?  BP Readings from Last 1 Encounters:  ?  02/23/21 138/72  ?  ?  ?  ?  ? amLODipine (NORVASC) 10 MG tablet [Pharmacy Med Name: AMLODIPINE 10 MG TAB[*]] 90 tablet 0  ?  Sig: TAKE ONE TABLET BY MOUTH ONE TIME DAILY  ?  ? Cardiovascular: Calcium Channel Blockers 2 Failed - 10/01/2021 12:42 PM  ?  ?  Failed - Valid encounter within last 6 months  ?  Recent Outpatient Visits   ? ?      ? 7 months ago Essential (primary) hypertension  ? [facility] Jerrol Banana., MD  ? 9 months ago Essential (primary) hypertension  ? [facility] Jerrol Banana., MD  ? 11 months ago Essential (primary) hypertension  ? [facility] Jerrol Banana., MD  ? 1 year ago Essential (primary) hypertension  ? [facility] Jerrol Banana., MD  ? 1 year ago Chronic idiopathic constipation  ? [facility] Jerrol Banana., MD  ? ?  ?  ?Future Appointments   ? ?        ? In 2 weeks Jerrol Banana., MD [facility]  ? ?  ? ?  ?  ?  Passed - Last BP in normal range  ?  BP Readings from Last 1 Encounters:  ?02/23/21 138/72  ?  ?  ?  ?  Passed - Last Heart Rate in normal range  ?  Pulse Readings from Last 1 Encounters:  ?02/23/21 60  ?  ?  ?  ?  ? atorvastatin (LIPITOR) 40 MG tablet [Pharmacy Med Name: ATORVASTATIN 40 MG TAB[*]] 90 tablet 0  ?  Sig: TAKE ONE TABLET BY MOUTH ONE TIME DAILY  ?  ? Cardiovascular:  Antilipid - Statins Failed - 10/01/2021 12:42 PM  ?  ?  Failed - Lipid Panel in normal range within the last 12 months  ?  Cholesterol, Total  ?Date Value Ref Range Status  ?12/24/2020 152 100 - 199 mg/dL Final  ? ?LDL Cholesterol (Calc)  ?Date Value Ref Range Status  ?06/01/2017 50 mg/dL (calc) Final  ?  Comment:  ?  Reference range: <100 ?Marland Kitchen ?Desirable range <100 mg/dL for primary prevention;   ?<70 mg/dL for patients with CHD or diabetic patients  ?with > or = 2 CHD risk factors. ?. ?LDL-C is now calculated using the Martin-Hopkins  ?calculation, which is a validated novel method providing  ?better accuracy than the Friedewald equation in the  ?estimation of LDL-C.  ?Cresenciano Genre et al. Annamaria Helling. 5056;979(48): 2061-2068  ?(http://education.[facility].com/faq/FAQ164) ?  ? ?LDL Chol Calc (NIH)  ?Date Value Ref Range Status  ?12/24/2020 59 0 - 99 mg/dL Final  ? ?HDL  ?Date Value Ref Range Status  ?12/24/2020 73 >39 mg/dL Final  ? ?Triglycerides  ?Date Value Ref  Range Status  ?12/24/2020 112 0 - 149 mg/dL Final  ? ?  ?  ?  Passed - Patient is not pregnant  ?  ?  Passed - Valid encounter within last 12 months  ?  Recent Outpatient Visits   ? ?      ? 7 months ago [facility]

## 2021-10-02 NOTE — Telephone Encounter (Signed)
Medications requested too soon- still has over 1 week supplies- not due until mid April. ?Requested Prescriptions  ?Pending Prescriptions Disp Refills  ?? hydrALAZINE (APRESOLINE) 100 MG tablet [Pharmacy Med Name: HYDRALAZINE 100 MG TAB[*]] 270 tablet 1  ?  Sig: TAKE ONE TABLET BY MOUTH THREE TIMES A DAY  ?  ? Cardiovascular:  Vasodilators Failed - 10/01/2021 12:42 PM  ?  ?  Failed - ANA Screen, Ifa, Serum in normal range and within 360 days  ?  No results found for: ANA, ANATITER, LABANTI   ?  ?  Passed - HCT in normal range and within 360 days  ?  Hematocrit  ?Date Value Ref Range Status  ?12/24/2020 41.8 37.5 - 51.0 % Final  ?   ?  ?  Passed - HGB in normal range and within 360 days  ?  Hemoglobin  ?Date Value Ref Range Status  ?12/24/2020 14.2 13.0 - 17.7 g/dL Final  ?   ?  ?  Passed - RBC in normal range and within 360 days  ?  RBC  ?Date Value Ref Range Status  ?12/24/2020 4.82 4.14 - 5.80 x10E6/uL Final  ?05/31/2020 4.69 4.22 - 5.81 MIL/uL Final  ?   ?  ?  Passed - WBC in normal range and within 360 days  ?  WBC  ?Date Value Ref Range Status  ?12/24/2020 6.5 3.4 - 10.8 x10E3/uL Final  ?05/31/2020 7.2 4.0 - 10.5 K/uL Final  ?   ?  ?  Passed - PLT in normal range and within 360 days  ?  Platelets  ?Date Value Ref Range Status  ?12/24/2020 207 150 - 450 x10E3/uL Final  ?   ?  ?  Passed - Last BP in normal range  ?  BP Readings from Last 1 Encounters:  ?02/23/21 138/72  ?   ?  ?  Passed - Valid encounter within last 12 months  ?  Recent Outpatient Visits   ?      ? 7 months ago Essential (primary) hypertension  ? [facility] Jerrol Banana., [facility]  ? 9 months ago Essential (primary) hypertension  ? [facility] Jerrol Banana., MD  ? 11 months ago Essential (primary) hypertension  ? [facility] Jerrol Ban[facility]., [facility]  ? 1 year ago Essential (primary) hypertension  ? [facility] Jerrol Banana., MD  ? 1 year ago Chronic idiopathic  constipation  ? [facility] Jerrol Banana., MD  ?  ?  ?Future Appointments   ?        ? In 2 weeks Jerrol Banana., MD [facility]  ?  ? ?  ?  ?  ?? sertraline (ZOLOFT) 100 MG tablet [Pharmacy Med Name: SERTRALINE 100 MG TAB[*]] 90 tablet 0  ?  Sig: TAKE ONE TABLET BY MOUTH ONE TIME DAILY  ?  ? Not Delegated - Psychiatry:  Antidepressants - SSRI - sertraline Failed - 10/01/2021 12:42 PM  ?  ?  Failed - This refill cannot be delegated  ?  ?  Failed - Valid encounter within last 6 months  ?  Recent Outpatient Visits   ?      ? 7 months ago Essential (primary) hypertension  ? [facility] Jerrol Banana., MD  ? 9 months ago Essential (primary) hypertension  ? [facility] Jerrol Banana., MD  ? 11 months ago Essential (  primary) hypertension  ? [facility] Jerrol Banana., MD  ? 1 year ago Essential (primary) hypertension  ? [facility] Jerrol Banana., MD  ? 1 year ago Chronic idiopathic constipation  ? [facility] Jerrol Banana., MD  ?  ?  ?Future Appointments   ?        ? In 2 weeks Jerrol Banana., MD [facility]  ?  ? ?  ?  ?  Passed - AST in normal range and within 360 days  ?  AST  ?Date Value Ref Range Status  ?12/24/2020 23 0 - 40 IU/L Final  ?   ?  ?  Passed - ALT in normal range and within 360 days  ?  ALT  ?Date Value Ref Range Status  ?12/24/2020 19 0 - 44 IU/L Final  ?   ?  ?  Passed - Completed PHQ-2 or PHQ-9 in the last 360 days  ?  ?  ?Refused Prescriptions Disp Refills  ?? chlorthalidone (HYGROTON) 25 MG tablet [Pharmacy Med Name: CHLORTHALIDONE 25 MG TAB[*]] 90 tablet 0  ?  Sig: TAKE ONE TABLET BY MOUTH ONE TIME DAILY  ?  ? Cardiovascular: Diuretics - Thiazide Failed - 10/01/2021 12:42 PM  ?  ?  Failed - Cr in normal range and within 180 days  ?  Creat  ?Date Value Ref Range Status  ?06/21/2017 0.83 0.70 - 1.33 mg/dL  Final  ?  Comment:  ?  For patients >35 years of age, the reference limit ?for Creatinine is approximately 13% higher for people ?identified as African-American. ?. ?  ? ?Creatinine, Ser  ?Date Value Ref Range Status  ?12/24/2020 1.00 0.76 - 1.27 mg/dL Final  ?   ?  ?  Failed - K in normal range and within 180 days  ?  Potassium  ?Date Value Ref Range Status  ?12/24/2020 3.3 (L) 3.5 - 5.2 mmol/L Final  ?   ?  ?  Failed - Na in normal range and within 180 days  ?  Sodium  ?Date Value Ref Range Status  ?12/24/2020 144 134 - 144 mmol/L Final  ?   ?  ?  Failed - Valid encounter within last 6 months  ?  Recent Outpatient Visits   ?      ? 7 months ago Essential (primary) hypertension  ? [facility] Jerrol Banana., MD  ? 9 months ago Essential (primary) hypertension  ? [facility] Jerrol Banana., MD  ? 11 months ago Essential (primary) hypertension  ? [facility] Jerrol Banana., MD  ? 1 year ago Essential (primary) hypertension  ? [facility] Jerrol Banana., MD  ? 1 year ago Chronic idiopathic constipation  ? [facility] Jerrol Banana., MD  ?  ?  ?Future Appointments   ?        ? In 2 weeks Jerrol Banana., MD [facility]  ?  ? ?  ?  ?  Passed - Last BP in normal range  ?  BP Readings from Last 1 Encounters:  ?02/23/21 138/72  ?   ?  ?  ?? amLODipine (NORVASC) 10 MG tablet [Pharmacy Med Name: AMLODIPINE 10 MG TAB[*]] 90 tablet 0  ?  Sig: TAKE ONE TABLET BY MOUTH ONE TIME DAILY  ?  ? Cardiovascular: Calcium Channel Blockers 2 Failed -  10/01/2021 12:42 PM  ?  ?  Failed - Valid encounter within last 6 months  ?  Recent Outpatient Visits   ?      ? 7 months ago Essential (primary) hypertension  ? [facility] Jerrol Banana., MD  ? 9 months ago Essential (primary) hypertension  ? [facility] Jerrol Banana., MD  ? 11 months ago Essential  (primary) hypertension  ? [facility] Jerrol Banana., MD  ? 1 year ago Essential (primary) hypertension  ? [facility] Jerrol Banana., MD  ? 1 year ago Chronic idiopathic constipation  ? [facility] Jerrol Banana., MD  ?  ?  ?Future Appointments   ?        ? In 2 weeks Jerrol Banana., MD [facility]  ?  ? ?  ?  ?  Passed - Last BP in normal range  ?  BP Readings from Last 1 Encounters:  ?02/23/21 138/72  ?   ?  ?  Passed - Last Heart Rate in normal range  ?  Pulse Readings from Last 1 Encounters:  ?02/23/21 60  ?   ?  ?  ?? atorvastatin (LIPITOR) 40 MG tablet [Pharmacy Med Name: ATORVASTATIN 40 MG TAB[*]] 90 tablet 0  ?  Sig: TAKE ONE TABLET BY MOUTH ONE TIME DAILY  ?  ? Cardiovascular:  Antilipid - Statins Failed - 10/01/2021 12:42 PM  ?  ?  Failed - Lipid Panel in normal range within the last 12 months  ?  Cholesterol, Total  ?Date Value Ref Range Status  ?12/24/2020 152 100 - 199 mg/dL Final  ? ?LDL Cholesterol (Calc)  ?Date Value Ref Range Status  ?06/01/2017 50 mg/dL (calc) Final  ?  Comment:  ?  Reference range: <100 ?Marland Kitchen ?Desirable range <100 mg/dL for primary prevention;   ?<70 mg/dL for patients with CHD or diabetic patients  ?with > or = 2 CHD risk factors. ?. ?LDL-C is now calculated using the Martin-Hopkins  ?calculation, which is a validated novel method providing  ?better accuracy than the Friedewald equation in the  ?estimation of LDL-C.  ?Cresenciano Genre et al. Annamaria Helling. 8325;498(26): 2061-2068  ?(http://education.[facility].com/faq/FAQ164) ?  ? ?LDL Chol Calc (NIH)  ?Date Value Ref Range Status  ?12/24/2020 59 0 - 99 mg/dL Final  ? ?HDL  ?Date Value Ref Range Status  ?12/24/2020 73 >39 mg/dL Final  ? ?Triglycerides  ?Date Value Ref Range Status  ?12/24/2020 112 0 - 149 mg/dL Final  ? ?  ?  ?  Passed - Patient is not pregnant  ?  ?  Passed - Valid encounter within last 12 months  ?  Recent Outpatient Visits   ?       ? 7 months ago Essential (primary) hypertension  ? [facility] Jerrol Banana., MD  ? 9 months ago Essential (primary) hypertension  ? [facility] Jerrol Banana., M

## 2021-10-22 ENCOUNTER — Encounter: Payer: Self-pay | Admitting: Family Medicine

## 2021-10-22 ENCOUNTER — Ambulatory Visit (INDEPENDENT_AMBULATORY_CARE_PROVIDER_SITE_OTHER): Payer: Managed Care, Other (non HMO) | Admitting: Family Medicine

## 2021-10-22 VITALS — BP 121/76 | HR 76 | Temp 97.7°F | Resp 16 | Ht 67.0 in | Wt 311.0 lb

## 2021-10-22 DIAGNOSIS — Z0000 Encounter for general adult medical examination without abnormal findings: Secondary | ICD-10-CM

## 2021-10-22 DIAGNOSIS — Z125 Encounter for screening for malignant neoplasm of prostate: Secondary | ICD-10-CM | POA: Diagnosis not present

## 2021-10-22 DIAGNOSIS — G4733 Obstructive sleep apnea (adult) (pediatric): Secondary | ICD-10-CM | POA: Diagnosis not present

## 2021-10-22 DIAGNOSIS — Z6841 Body Mass Index (BMI) 40.0 and over, adult: Secondary | ICD-10-CM

## 2021-10-22 DIAGNOSIS — F319 Bipolar disorder, unspecified: Secondary | ICD-10-CM

## 2021-10-22 NOTE — Patient Instructions (Signed)
Please take Amlodipine and Losartan at night.   ?

## 2021-10-22 NOTE — Progress Notes (Signed)
? ? ? ?Complete physical exam ? ?I,[name],acting as a scribe for [name], MD.,have documented all relevant documentation on the behalf of [name], MD,as directed by  [name], MD while in the presence of [name], MD. ? ? ?Patient: Peter [name]   DOB: 1960/09/10   61 y.o. Male  MRN: [id number] ?Visit Date: 10/22/2021 ? ?Today's healthcare provider: [name], MD  ? ?Chief Complaint  ?Patient presents with  ? Annual Exam  ? ?Subjective  ?  ?Peter [name] is a 61 y.o. male who presents today for a complete physical exam.  ?He reports consuming a general diet. Home exercise routine includes walking dog. He generally feels fairly well. He reports sleeping well. He does not have additional problems to discuss today.  ?HPI  ?He is seeing psychiatry and depression is a struggle.  He does not like his present job.  He and his wife are considering when he turns 61 to retire to [place] which is where she is from as the cost of living is so much less. ? ?Past Medical History:  ?Diagnosis Date  ? Bipolar disorder ([place])   ? Blood in stool   ? Difficult intubation   ? Dyspnea   ? GERD (gastroesophageal reflux disease)   ? Glaucoma   ? since 2004  ? Hypertension   ? since 2000  ? Obesity, unspecified   ? Sleep apnea   ? Ulcer 2011  ? gastric  ? Unspecified hemorrhoids without mention of complication   ? ?Past Surgical History:  ?Procedure Laterality Date  ? APPLICATION OF WOUND VAC N/A 11/16/2016  ? Procedure: APPLICATION OF WOUND VAC-ABDOMINAL;  Surgeon: Florene Glen, MD;  Location: [facility];  Service: General;  Laterality: N/A;  ? APPLICATION OF WOUND VAC N/A 11/18/2016  ? Procedure: APPLICATION OF WOUND VAC;  Surgeon: Florene Glen, MD;  Location: [facility];  Service: General;  Laterality: N/A;  change  ? APPLICATION OF WOUND VAC N/A 11/23/2016  ? Procedure: WOUND VAC CHANGE;  Surgeon: Olean Ree, MD;  Location: [facility];  Service: General;  Laterality:  N/A;  wound vac application  ? CARDIAC CATHETERIZATION Left 06/23/2016  ? Procedure: Left Heart Cath and Coronary Angiography;  Surgeon: Nelva Bush, MD;  Location: [facility];  Service: Cardiovascular;  Laterality: Left;  ? CHOLECYSTECTOMY  1992  ? COLONOSCOPY W/ POLYPECTOMY  05/22/2010  ? 69mmtransverse colon polyp, traditional serrated adenoma,negative for high grade dysplasia & malignancy. rectal polyp-68mm,negative for dysplasia & malignancy.  ? COLONOSCOPY WITH PROPOFOL N/A 06/20/2019  ? Procedure: COLONOSCOPY WITH PROPOFOL;  Surgeon: Lin Landsman, MD;  Location: [facility];  Service: Gastroenterology;  Laterality: N/A;  ? COLONOSCOPY WITH PROPOFOL N/A 06/21/2019  ? Procedure: COLONOSCOPY WITH PROPOFOL;  Surgeon: Lin Landsman, MD;  Location: [facility];  Service: Gastroenterology;  Laterality: N/A;  ? DRESSING CHANGE UNDER ANESTHESIA N/A 11/21/2016  ? Procedure: DRESSING CHANGE UNDER ANESTHESIA;  Surgeon: Florene Glen, MD;  Location: [facility];  Service: General;  Laterality: N/A;  ? ESOPHAGOGASTRODUODENOSCOPY (EGD) WITH PROPOFOL N/A 06/20/2019  ? Procedure: ESOPHAGOGASTRODUODENOSCOPY (EGD) WITH PROPOFOL;  Surgeon: Lin Landsman, MD;  Location: [facility];  Service: Gastroenterology;  Laterality: N/A;  ? KNEE SURGERY Left 2010  ? LAPAROSCOPIC APPENDECTOMY N/A 10/30/2016  ? Procedure: APPENDECTOMY LAPAROSCOPIC changed  to open application of wound vac;  Surgeon: [name], MD;  Location: [facility];  Service: General;  Laterality: N/A;  ?  LAPAROTOMY N/A 11/11/2016  ? Procedure: EXPLORATORY LAPAROTOMY, DEBRIDEMENT OF ABDOMINAL WOUND, ABDOMINAL Stewart OUT;  Surgeon: [name], MD;  Location: [facility];  Service: General;  Laterality: N/A;  ? LAPAROTOMY N/A 11/12/2016  ? Procedure: EXPLORATORY LAPAROTOMY;  Surgeon: Clayburn Pert, MD;  Location: [facility];  Service: General;  Laterality: N/A;  ? LAPAROTOMY N/A 11/14/2016  ? Procedure: EXPLORATORY LAPAROTOMY;  Irrigation, partial closure;  Surgeon: Clayburn Pert, MD;  Location: [facility];  Service: General;  Laterality: N/A;  ? TOTAL KNEE ARTHROPLASTY Left 03/21/2019  ? Procedure: TOTAL KNEE ARTHROPLASTY;  Surgeon: Lovell Sheehan, MD;  Location: [facility];  Service: Orthopedics;  Laterality: Left;  ? ?Social History  ? ?Socioeconomic History  ? Marital status: Married  ?  Spouse name: Not on file  ? Number of children: Not on file  ? Years of education: Not on file  ? Highest education level: Not on file  ?Occupational History  ? Not on file  ?Tobacco Use  ? Smoking status: Never  ? Smokeless tobacco: Never  ?Vaping Use  ? Vaping Use: Never used  ?Substance and Sexual Activity  ? Alcohol use: Not Currently  ?  Comment: occasionally  ? Drug use: No  ? Sexual activity: Not on file  ?Other Topics Concern  ? Not on file  ?Social History Narrative  ? Not on file  ? ?Social Determinants of Health  ? ?Financial Resource Strain: Not on file  ?Food Insecurity: Not on file  ?Transportation Needs: Not on file  ?Physical Activity: Not on file  ?Stress: Not on file  ?Social Connections: Not on file  ?Intimate Partner Violence: Not on file  ? ?Family Status  ?Relation Name Status  ? Other  Deceased  ? Mother  Deceased  ? Father  Deceased  ? Sister  Alive  ? Sister  Alive  ? ?Family History  ?Problem Relation Age of Onset  ? Cancer Other   ?     colon  ? Hyperlipidemia Mother   ? Hypertension Mother   ? ?Allergies  ?Allergen Reactions  ? Oxycodone Other (See Comments)  ?  Causes Hallucinations, auditory and tactile  ? Isosorbide Nausea And Vomiting  ?  Headache, nausea, vomiting  ?  ?Patient Care Team: ?Jerrol Banana., MD as PCP - General (Family Medicine)  ? ?Medications: ?Outpatient Medications Prior to Visit  ?Medication Sig  ? ALPRAZolam (XANAX) 0.5 MG tablet TAKE 1 TABLET(0.5 MG) BY MOUTH TWICE DAILY AS NEEDED FOR ANXIETY  ? amLODipine (NORVASC) 10 MG tablet TAKE ONE TABLET BY MOUTH ONE TIME DAILY  ? aspirin EC 81 MG  tablet Take 81 mg by mouth daily.  ? atorvastatin (LIPITOR) 40 MG tablet TAKE ONE TABLET BY MOUTH ONE TIME DAILY  ? b complex vitamins capsule Take 1 capsule by mouth daily.  ? carbamazepine (TEGRETOL) 200 MG tablet TAKE THREE TABLETS BY MOUTH EVERY MORNING AND TAKE TWO TABLETS BY MOUTH AT BEDTIME  ? chlorthalidone (HYGROTON) 25 MG tablet TAKE ONE TABLET BY MOUTH ONE TIME DAILY  ? Cholecalciferol (NAT-RUL VITAMIN D) 25 MCG (1000 UT) tablet Take 1,000 Units by mouth daily.  ? cloNIDine (CATAPRES) 0.2 MG tablet TAKE ONE TABLET BY MOUTH TWICE A DAY  ? hydrALAZINE (APRESOLINE) 100 MG tablet TAKE ONE TABLET BY MOUTH THREE TIMES A DAY  ? lamoTRIgine (LAMICTAL) 200 MG tablet TAKE ONE TABLET BY MOUTH TWICE A DAY  ? losartan (COZAAR) 100 MG tablet TAKE ONE TABLET BY MOUTH ONE TIME DAILY  ?  metoprolol tartrate (LOPRESSOR) 50 MG tablet TAKE ONE TABLET BY MOUTH TWICE A DAY  ? modafinil (PROVIGIL) 200 MG tablet Take 0.5 tablets (100 mg total) by mouth daily.  ? NON FORMULARY CPAP  ? Omega-3 Fatty Acids (FISH OIL) 1000 MG CAPS Take 1 capsule by mouth daily.   ? omeprazole (PRILOSEC) 40 MG capsule Take 1 capsule (40 mg total) by mouth daily before breakfast.  ? potassium chloride (KLOR-CON) 10 MEQ tablet Take 10 mEq by mouth daily.  ? sertraline (ZOLOFT) 100 MG tablet TAKE ONE TABLET BY MOUTH ONE TIME DAILY  ? vitamin B-12 (CYANOCOBALAMIN) 1000 MCG tablet Take 1,000 mcg by mouth daily.   ? latanoprost (XALATAN) 0.005 % ophthalmic solution Place 1 drop into both eyes at bedtime.  (Patient not taking: Reported on 10/22/2021)  ? NARCAN 4 MG/0.1ML LIQD nasal spray kit USE 1 SPRAY IEN UTD PRN (Patient not taking: Reported on 02/23/2021)  ? [DISCONTINUED] azelastine (ASTELIN) 0.1 % nasal spray SMARTSIG:1-2 Spray(s) Both Nares Twice Daily (Patient not taking: Reported on 10/22/2021)  ? [DISCONTINUED] eplerenone (INSPRA) 50 MG tablet Take 50 mg by mouth daily. (Patient not taking: No sig reported)  ? [DISCONTINUED] tamsulosin (FLOMAX) 0.4 MG  CAPS capsule Take 1 capsule (0.4 mg total) by mouth daily. (Patient not taking: No sig reported)  ? [DISCONTINUED] triamcinolone (NASACORT) 55 MCG/ACT AERO nasal inhaler Place 2 sprays into the nose daily. (Patient

## 2021-10-23 ENCOUNTER — Other Ambulatory Visit: Payer: Self-pay | Admitting: Family Medicine

## 2021-10-23 DIAGNOSIS — I10 Essential (primary) hypertension: Secondary | ICD-10-CM

## 2021-10-23 DIAGNOSIS — E7800 Pure hypercholesterolemia, unspecified: Secondary | ICD-10-CM

## 2021-10-23 LAB — COMPREHENSIVE METABOLIC PANEL
ALT: 17 IU/L (ref 0–44)
AST: 21 IU/L (ref 0–40)
Albumin/Globulin Ratio: 1.5 (ref 1.2–2.2)
Albumin: 4 g/dL (ref 3.8–4.8)
Alkaline Phosphatase: 123 IU/L — ABNORMAL HIGH (ref 44–121)
BUN/Creatinine Ratio: 25 — ABNORMAL HIGH (ref 10–24)
BUN: 24 mg/dL (ref 8–27)
Bilirubin Total: 0.3 mg/dL (ref 0.0–1.2)
CO2: 28 mmol/L (ref 20–29)
Calcium: 9 mg/dL (ref 8.6–10.2)
Chloride: 104 mmol/L (ref 96–106)
Creatinine, Ser: 0.96 mg/dL (ref 0.76–1.27)
Globulin, Total: 2.7 g/dL (ref 1.5–4.5)
Glucose: 133 mg/dL — ABNORMAL HIGH (ref 70–99)
Potassium: 3.3 mmol/L — ABNORMAL LOW (ref 3.5–5.2)
Sodium: 146 mmol/L — ABNORMAL HIGH (ref 134–144)
Total Protein: 6.7 g/dL (ref 6.0–8.5)
eGFR: 89 mL/min/{1.73_m2} (ref >59)

## 2021-10-23 LAB — CBC WITH DIFFERENTIAL/PLATELET
Basophils Absolute: 0 10*3/uL (ref 0.0–0.2)
Basos: 1 %
EOS (ABSOLUTE): 0.2 10*3/uL (ref 0.0–0.4)
Eos: 3 %
Hematocrit: 41.2 % (ref 37.5–51.0)
Hemoglobin: 13.8 g/dL (ref 13.0–17.7)
Immature Grans (Abs): 0 10*3/uL (ref 0.0–0.1)
Immature Granulocytes: 0 %
Lymphocytes Absolute: 2.1 10*3/uL (ref 0.7–3.1)
Lymphs: 30 %
MCH: 29.7 pg (ref 26.6–33.0)
MCHC: 33.5 g/dL (ref 31.5–35.7)
MCV: 89 fL (ref 79–97)
Monocytes Absolute: 0.5 10*3/uL (ref 0.1–0.9)
Monocytes: 7 %
Neutrophils Absolute: 4.1 10*3/uL (ref 1.4–7.0)
Neutrophils: 59 %
Platelets: 224 10*3/uL (ref 150–450)
RBC: 4.65 x10E6/uL (ref 4.14–5.80)
RDW: 12.6 % (ref 11.6–15.4)
WBC: 6.9 10*3/uL (ref 3.4–10.8)

## 2021-10-23 LAB — TSH: TSH: 2.28 u[IU]/mL (ref 0.450–4.500)

## 2021-10-23 LAB — PSA: Prostate Specific Ag, Serum: 1.5 ng/mL (ref 0.0–4.0)

## 2021-10-23 NOTE — Telephone Encounter (Signed)
Requested Prescriptions  ?Pending Prescriptions Disp Refills  ?? atorvastatin (LIPITOR) 40 MG tablet [Pharmacy Med Name: ATORVASTATIN 40 MG TAB[*]] 90 tablet 0  ?  Sig: TAKE ONE TABLET BY MOUTH ONE TIME DAILY  ?  ? Cardiovascular:  Antilipid - Statins Failed - 10/23/2021  9:12 AM  ?  ?  Failed - Lipid Panel in normal range within the last 12 months  ?  Cholesterol, Total  ?Date Value Ref Range Status  ?12/24/2020 152 100 - 199 mg/dL Final  ? ?LDL Cholesterol (Calc)  ?Date Value Ref Range Status  ?06/01/2017 50 mg/dL (calc) Final  ?  Comment:  ?  Reference range: <100 ?Marland Kitchen ?Desirable range <100 mg/dL for primary prevention;   ?<70 mg/dL for patients with CHD or diabetic patients  ?with > or = 2 CHD risk factors. ?. ?LDL-C is now calculated using the Martin-Hopkins  ?calculation, which is a validated novel method providing  ?better accuracy than the Friedewald equation in the  ?estimation of LDL-C.  ?Cresenciano Genre et al. Annamaria Helling. 9767;341(93): 2061-2068  ?(http://education.[employer].com/faq/FAQ164) ?  ? ?LDL Chol Calc (NIH)  ?Date Value Ref Range Status  ?12/24/2020 59 0 - 99 mg/dL Final  ? ?HDL  ?Date Value Ref Range Status  ?12/24/2020 73 >39 mg/dL Final  ? ?Triglycerides  ?Date Value Ref Range Status  ?12/24/2020 112 0 - 149 mg/dL Final  ? ?  ?  ?  Passed - Patient is not pregnant  ?  ?  Passed - Valid encounter within last 12 months  ?  Recent Outpatient Visits   ?      ? Yesterday Annual physical exam  ? [facility] Jerrol Banana., MD  ? 8 months ago Essential (primary) hypertension  ? [facility] Jerrol Banana., MD  ? 10 months ago Essential (primary) hypertension  ? [facility] Jerrol Banana., MD  ? 12 months ago Essential (primary) hypertension  ? [facility] Jerrol Banana., MD  ? 1 year ago Essential (primary) hypertension  ? [facility] Jerrol Banana., MD  ?  ?  ?Future Appointments   ?        ? In  6 months Jerrol Banana., MD [facility]  ?  ? ?  ?  ?  ?? chlorthalidone (HYGROTON) 25 MG tablet [Pharmacy Med Name: CHLORTHALIDONE 25 MG TAB[*]] 90 tablet 0  ?  Sig: TAKE ONE TABLET BY MOUTH ONE TIME DAILY  ?  ? Cardiovascular: Diuretics - Thiazide Failed - 10/23/2021  9:12 AM  ?  ?  Failed - K in normal range and within 180 days  ?  Potassium  ?Date Value Ref Range Status  ?10/22/2021 3.3 (L) 3.5 - 5.2 mmol/L Final  ?   ?  ?  Failed - Na in normal range and within 180 days  ?  Sodium  ?Date Value Ref Range Status  ?10/22/2021 146 (H) 134 - 144 mmol/L Final  ?   ?  ?  Passed - Cr in normal range and within 180 days  ?  Creat  ?Date Value Ref Range Status  ?06/21/2017 0.83 0.70 - 1.33 mg/dL Final  ?  Comment:  ?  For patients >109 years of age, the reference limit ?for Creatinine is approximately 13% higher for people ?identified as African-American. ?. ?  ? ?Creatinine, Ser  ?Date Value Ref Range Status  ?10/22/2021 0.96 0.76 - 1.27 mg/dL Final  ?   ?  ?  Passed - Last BP in normal range  ?  BP Readings from Last 1 Encounters:  ?10/22/21 121/76  ?   ?  ?  Passed - Valid encounter within last 6 months  ?  Recent Outpatient Visits   ?      ? Yesterday Annual physical exam  ? [facility] Jerrol Banana., MD  ? 8 months ago Essential (primary) hypertension  ? [facility] Jerrol Banana., MD  ? 10 months ago Essential (primary) hypertension  ? [facility] Jerrol Banana., MD  ? 12 months ago Essential (primary) hypertension  ? [facility] Jerrol Banana., MD  ? 1 year ago Essential (primary) hypertension  ? [facility] Jerrol Banana., MD  ?  ?  ?Future Appointments   ?        ? In 6 months Jerrol Banana., MD [facility]  ?  ? ?  ?  ?  ?? losartan (COZAAR) 100 MG tablet [Pharmacy Med Name: LOSARTAN 100 MG TAB] 90 tablet 1  ?  Sig: TAKE ONE TABLET BY MOUTH ONE  TIME DAILY  ?  ? Cardiovascular:  Angiotensin Receptor Blockers Failed - 10/23/2021  9:12 AM  ?  ?  Failed - K in normal range and within 180 days  ?  Potassium  ?Date Value Ref Range Status  ?10/22/2021 3.3 (L) 3.5 - 5.2 mmol/L Final  ?   ?  ?  Passed - Cr in normal range and within 180 days  ?  Creat  ?Date Value Ref Range Status  ?06/21/2017 0.83 0.70 - 1.33 mg/dL Final  ?  Comment:  ?  For patients >46 years of age, the reference limit ?for Creatinine is approximately 13% higher for people ?identified as African-American. ?. ?  ? ?Creatinine, Ser  ?Date Value Ref Range Status  ?10/22/2021 0.96 0.76 - 1.27 mg/dL Final  ?   ?  ?  Passed - Patient is not pregnant  ?  ?  Passed - Last BP in normal range  ?  BP Readings from Last 1 Encounters:  ?10/22/21 121/76  ?   ?  ?  Passed - Valid encounter within last 6 months  ?  Recent Outpatient Visits   ?      ? Yesterday Annual physical exam  ? [facility] Jerrol Banana., MD  ? 8 months ago Essential (primary) hypertension  ? [facility] Jerrol Banana., MD  ? 10 months ago Essential (primary) hypertension  ? [facility] Jerrol Banana., MD  ? 12 months ago Essential (primary) hypertension  ? [facility] Jerrol Banana., MD  ? 1 year ago Essential (primary) hypertension  ? [facility] Jerrol Banana., MD  ?  ?  ?Future Appointments   ?        ? In 6 months Jerrol Banana., MD [facility]  ?  ? ?  ?  ?  ? ? ?

## 2021-11-06 ENCOUNTER — Other Ambulatory Visit: Payer: Self-pay | Admitting: Family Medicine

## 2021-11-06 DIAGNOSIS — I10 Essential (primary) hypertension: Secondary | ICD-10-CM

## 2021-11-06 NOTE — Telephone Encounter (Signed)
Requested Prescriptions  ?Pending Prescriptions Disp Refills  ?? amLODipine (NORVASC) 10 MG tablet [Pharmacy Med Name: AMLODIPINE 10 MG TAB[*]] 90 tablet 1  ?  Sig: TAKE ONE TABLET BY MOUTH ONE TIME DAILY  ?  ? Cardiovascular: Calcium Channel Blockers 2 Passed - 11/06/2021 11:12 AM  ?  ?  Passed - Last BP in normal range  ?  BP Readings from Last 1 Encounters:  ?10/22/21 121/76  ?   ?  ?  Passed - Last Heart Rate in normal range  ?  Pulse Readings from Last 1 Encounters:  ?10/22/21 76  ?   ?  ?  Passed - Valid encounter within last 6 months  ?  Recent Outpatient Visits   ?      ? 2 weeks ago Annual physical exam  ? [facility] Jerrol Banana., MD  ? 8 months ago Essential (primary) hypertension  ? [facility] Jerrol Banana., MD  ? 10 months ago Essential (primary) hypertension  ? [facility] Jerrol Banana., MD  ? 1 year ago Essential (primary) hypertension  ? [facility] Jerrol Banana., MD  ? 1 year ago Essential (primary) hypertension  ? [facility] Jerrol Banana., MD  ?  ?  ?Future Appointments   ?        ? In 5 months Jerrol Banana., MD [facility]  ?  ? ?  ?  ?  ? ?

## 2021-11-13 ENCOUNTER — Ambulatory Visit: Payer: 59 | Admitting: Physician Assistant

## 2021-11-20 ENCOUNTER — Ambulatory Visit: Payer: Commercial Managed Care - HMO | Admitting: Physician Assistant

## 2021-11-20 ENCOUNTER — Other Ambulatory Visit: Payer: Self-pay | Admitting: Physician Assistant

## 2021-11-20 ENCOUNTER — Encounter: Payer: Self-pay | Admitting: Physician Assistant

## 2021-11-20 DIAGNOSIS — F411 Generalized anxiety disorder: Secondary | ICD-10-CM

## 2021-11-20 DIAGNOSIS — Z79899 Other long term (current) drug therapy: Secondary | ICD-10-CM | POA: Diagnosis not present

## 2021-11-20 DIAGNOSIS — F319 Bipolar disorder, unspecified: Secondary | ICD-10-CM | POA: Diagnosis not present

## 2021-11-20 DIAGNOSIS — R5383 Other fatigue: Secondary | ICD-10-CM | POA: Diagnosis not present

## 2021-11-20 NOTE — Progress Notes (Signed)
[facility] Med Check  Patient ID: Peter [name],  MRN: [id number]  PCP: Jerrol Banana., MD  Date of Evaluation: 11/20/2021 Time spent:30 minutes  Chief Complaint:  Chief Complaint   Anxiety; Depression; Follow-up     HISTORY/CURRENT STATUS: For routine med check.    His meds are working well.  Patient denies loss of interest in usual activities and is able to enjoy things. Enjoys spending time with his family and dog. Modafinil didn't help energy so he stopped it.   Denies decreased motivation.  Work is going well, although it's the best environment. ADLs and personal hygiene are normal.  Appetite has not changed.  Weight is stable.  No extreme sadness, tearfulness, or feelings of hopelessness.  Sleeps well most of the time.  Denies any changes in concentration, making decisions or remembering things.  Denies suicidal or homicidal thoughts.  Anxiety is well controlled.  No panic attacks.  He does have Xanax that he takes on rare occasions but not very often.  He has medication left over from his last prescription sent in May 2022.  Patient denies increased energy with decreased need for sleep, no increased talkativeness, no racing thoughts, no impulsivity or risky behaviors, no increased spending, no increased libido, no grandiosity, no increased irritability or anger, no paranoia, and no hallucinations.  Denies dizziness, syncope, seizures, numbness, tingling, tremor, tics, unsteady gait, slurred speech, confusion. Denies muscle or joint pain, stiffness, or dystonia. Denies unexplained weight loss, frequent infections, or sores that heal slowly.  No polyphagia, polydipsia, or polyuria. Denies visual changes or paresthesias.   Individual Medical History/ Review of Systems: Changes? :No    Past medications for mental health diagnoses include: Zoloft, Cymbalta did not help, Pristiq did not help, Seroquel, Xanax, Depakote, Lamictal, Equetro, Klonopin, Modafinil didn't help  energy.   Allergies: Oxycodone and Isosorbide  Current Medications:  Current Outpatient Medications:    ALPRAZolam (XANAX) 0.5 MG tablet, TAKE 1 TABLET(0.5 MG) BY MOUTH TWICE DAILY AS NEEDED FOR ANXIETY, Disp: 60 tablet, Rfl: 1   amLODipine (NORVASC) 10 MG tablet, TAKE ONE TABLET BY MOUTH ONE TIME DAILY, Disp: 90 tablet, Rfl: 1   aspirin EC 81 MG tablet, Take 81 mg by mouth daily., Disp: , Rfl:    atorvastatin (LIPITOR) 40 MG tablet, TAKE ONE TABLET BY MOUTH ONE TIME DAILY, Disp: 90 tablet, Rfl: 1   b complex vitamins capsule, Take 1 capsule by mouth daily., Disp: 30 capsule, Rfl: 11   carbamazepine (TEGRETOL) 200 MG tablet, TAKE THREE TABLETS BY MOUTH EVERY MORNING AND TAKE TWO TABLETS BY MOUTH AT BEDTIME, Disp: 450 tablet, Rfl: 1   chlorthalidone (HYGROTON) 25 MG tablet, TAKE ONE TABLET BY MOUTH ONE TIME DAILY, Disp: 90 tablet, Rfl: 1   Cholecalciferol (NAT-RUL VITAMIN D) 25 MCG (1000 UT) tablet, Take 1,000 Units by mouth daily., Disp: , Rfl:    cloNIDine (CATAPRES) 0.2 MG tablet, TAKE ONE TABLET BY MOUTH TWICE A DAY, Disp: 60 tablet, Rfl: 3   hydrALAZINE (APRESOLINE) 100 MG tablet, TAKE ONE TABLET BY MOUTH THREE TIMES A DAY, Disp: 270 tablet, Rfl: 1   lamoTRIgine (LAMICTAL) 200 MG tablet, TAKE ONE TABLET BY MOUTH TWICE A DAY, Disp: 60 tablet, Rfl: 2   latanoprost (XALATAN) 0.005 % ophthalmic solution, Place 1 drop into both eyes at bedtime., Disp: , Rfl:    losartan (COZAAR) 100 MG tablet, TAKE ONE TABLET BY MOUTH ONE TIME DAILY, Disp: 90 tablet, Rfl: 1   metoprolol tartrate (LOPRESSOR) 50 MG tablet,  TAKE ONE TABLET BY MOUTH TWICE A DAY, Disp: 180 tablet, Rfl: 3   NON FORMULARY, CPAP, Disp: , Rfl:    Omega-3 Fatty Acids (FISH OIL) 1000 MG CAPS, Take 1 capsule by mouth daily. , Disp: , Rfl:    omeprazole (PRILOSEC) 40 MG capsule, Take 1 capsule (40 mg total) by mouth daily before breakfast., Disp: 30 capsule, Rfl: 3   potassium chloride (KLOR-CON) 10 MEQ tablet, Take 10 mEq by mouth daily.,  Disp: , Rfl:    sertraline (ZOLOFT) 100 MG tablet, TAKE ONE TABLET BY MOUTH ONE TIME DAILY, Disp: 90 tablet, Rfl: 0   vitamin B-12 (CYANOCOBALAMIN) 1000 MCG tablet, Take 1,000 mcg by mouth daily. , Disp: , Rfl:    NARCAN 4 MG/0.1ML LIQD nasal spray kit, USE 1 SPRAY IEN UTD PRN (Patient not taking: Reported on 02/23/2021), Disp: , Rfl:  Medication Side Effects: none  Family Medical/ Social History: Changes? No  MENTAL HEALTH EXAM:  There were no vitals taken for this visit.There is no height or weight on file to calculate BMI.  General Appearance: Casual, Neat, Well Groomed and Obese  Eye Contact:  Good  Speech:  Clear and Coherent and Normal Rate  Volume:  Normal  Mood:  Euthymic  Affect:  Appropriate  Thought Process:  Goal Directed and Descriptions of Associations: Circumstantial  Orientation:  Full (Time, Place, and Person)  Thought Content: Logical   Suicidal Thoughts:  No  Homicidal Thoughts:  No  Memory:  WNL  Judgement:  Good  Insight:  Good  Psychomotor Activity:  Normal  Concentration:  Concentration: Good and Attention Span: Good  Recall:  Good  Fund of Knowledge: Good  Language: Good  Assets:  Desire for Improvement Financial Resources/Insurance Housing Transportation Vocational/Educational  ADL's:  Intact  Cognition: WNL  Prognosis:  Good    12/24/2020 Carbamazepine level 9.4  10/22/2021 Reviewed CBC, CMP, TSH, PSA.  See on chart carbamazepine was not drawn.   DIAGNOSES:    ICD-10-CM   1. Bipolar I disorder (HCC)  F31.9 Carbamazepine level, total    2. Fatigue, unspecified type  R53.83     3. Generalized anxiety disorder  F41.1     4. Encounter for long-term (current) use of medications  Z79.899 Carbamazepine level, total      Receiving Psychotherapy: No    RECOMMENDATIONS:  PDMP was reviewed.  Last Xanax filled 03/27/2021.  Modafinil filled 05/15/2021. I provided 30 minutes of face to face time during this encounter, including time spent before  and after the visit in records review, medical decision making, counseling pertinent to today's visit, and charting.  I am glad to see him doing so well.  No change in treatment necessary.  Continue Xanax 0.5 mg, 1 p.o. twice daily as needed. Continue carbamazepine 200 mg, 3 p.o. every morning, 2 p.o. nightly. Continue Lamictal 200 mg 1 p.o. twice daily.  Continue Zoloft 100 mg, 1 p.o. twice daily. (per Dr. Rosanna Randy) Continue multivitamin, B complex, omega-3, vitamin D per med sheet. Carbamazepine level ordered to be done at his next lab draw with PCP in a few months. Return in 6 months.Donnal Moat, PA-C

## 2021-12-16 ENCOUNTER — Other Ambulatory Visit: Payer: Self-pay | Admitting: Family Medicine

## 2021-12-16 DIAGNOSIS — I10 Essential (primary) hypertension: Secondary | ICD-10-CM

## 2021-12-17 NOTE — Telephone Encounter (Signed)
Requested Prescriptions  Pending Prescriptions Disp Refills  . cloNIDine (CATAPRES) 0.2 MG tablet [Pharmacy Med Name: CLONIDINE 0.2 MG TAB[**]] 60 tablet 3    Sig: TAKE ONE TABLET BY MOUTH TWICE A DAY     Cardiovascular:  Alpha-2 Agonists Passed - 12/16/2021  7:21 PM      Passed - Last BP in normal range    BP Readings from Last 1 Encounters:  10/22/21 121/76         Passed - Last Heart Rate in normal range    Pulse Readings from Last 1 Encounters:  10/22/21 76         Passed - Valid encounter within last 6 months    Recent Outpatient Visits          1 month ago Annual physical exam   [facility] Jerrol Banana., MD   9 months ago Essential (primary) hypertension   [facility] Jerrol Banana., MD   11 months ago Essential (primary) hypertension   [facility] Jerrol Banana., MD   1 year ago Essential (primary) hypertension   [facility] Jerrol Banana., MD   1 year ago Essential (primary) hypertension   [facility] Jerrol Banana., MD      Future Appointments            In 4 months Jerrol Banana., MD [facility] [place]

## 2021-12-29 ENCOUNTER — Other Ambulatory Visit: Payer: Self-pay | Admitting: Physician Assistant

## 2022-02-20 ENCOUNTER — Other Ambulatory Visit: Payer: Self-pay | Admitting: Family Medicine

## 2022-02-26 ENCOUNTER — Other Ambulatory Visit: Payer: Self-pay | Admitting: Physician Assistant

## 2022-02-26 ENCOUNTER — Other Ambulatory Visit: Payer: Self-pay | Admitting: Family Medicine

## 2022-02-26 DIAGNOSIS — I10 Essential (primary) hypertension: Secondary | ICD-10-CM

## 2022-02-26 NOTE — Telephone Encounter (Signed)
rx was sent to pharmacy on 12/17/21 #60/3. E-Prescribing Status: Receipt confirmed by pharmacy (12/17/2021 8:06 AM EDT) Requested Prescriptions  Pending Prescriptions Disp Refills  . cloNIDine (CATAPRES) 0.2 MG tablet [Pharmacy Med Name: CLONIDINE 0.2 MG TAB] 60 tablet 3    Sig: TAKE ONE TABLET BY MOUTH TWICE A DAY     Cardiovascular:  Alpha-2 Agonists Passed - 02/26/2022 12:05 PM      Passed - Last BP in normal range    BP Readings from Last 1 Encounters:  10/22/21 121/76         Passed - Last Heart Rate in normal range    Pulse Readings from Last 1 Encounters:  10/22/21 76         Passed - Valid encounter within last 6 months    Recent Outpatient Visits          4 months ago Annual physical exam   [facility] Jerrol Banana., MD   1 year ago Essential (primary) hypertension   [facility] Jerrol Banana., MD   1 year ago Essential (primary) hypertension   [facility] Jerrol Banana., MD   1 year ago Essential (primary) hypertension   [facility] Jerrol Banana., MD   1 year ago Essential (primary) hypertension   [facility] Jerrol Banana., MD      Future Appointments            In 2 months Jerrol Banana., MD [facility], [place]

## 2022-03-05 ENCOUNTER — Telehealth: Payer: Self-pay | Admitting: *Deleted

## 2022-03-05 ENCOUNTER — Other Ambulatory Visit: Payer: Self-pay | Admitting: Family Medicine

## 2022-03-05 DIAGNOSIS — I10 Essential (primary) hypertension: Secondary | ICD-10-CM

## 2022-03-05 NOTE — Telephone Encounter (Signed)
Pt has newer rx with refills. Requested Prescriptions  Pending Prescriptions Disp Refills  . cloNIDine (CATAPRES) 0.2 MG tablet [Pharmacy Med Name: CLONIDINE 0.2 MG TAB] 60 tablet 3    Sig: TAKE ONE TABLET BY MOUTH TWICE A DAY     Cardiovascular:  Alpha-2 Agonists Passed - 03/05/2022  8:59 AM      Passed - Last BP in normal range    BP Readings from Last 1 Encounters:  10/22/21 121/76         Passed - Last Heart Rate in normal range    Pulse Readings from Last 1 Encounters:  10/22/21 76         Passed - Valid encounter within last 6 months    Recent Outpatient Visits          4 months ago Annual physical exam   [facility] Jerrol Banana., MD   1 year ago Essential (primary) hypertension   [facility] Jerrol Banana., MD   1 year ago Essential (primary) hypertension   [facility] Jerrol Banana., MD   1 year ago Essential (primary) hypertension   [facility] Jerrol Banana., MD   1 year ago Essential (primary) hypertension   [facility] Jerrol Banana., MD      Future Appointments            In 1 month Jerrol Banana., MD [facility], [place]

## 2022-03-09 NOTE — Telephone Encounter (Signed)
Called pharm to confirm that pt has newer rx that can be filled.

## 2022-04-03 ENCOUNTER — Other Ambulatory Visit: Payer: Self-pay | Admitting: Family Medicine

## 2022-04-03 DIAGNOSIS — E7800 Pure hypercholesterolemia, unspecified: Secondary | ICD-10-CM

## 2022-04-05 NOTE — Telephone Encounter (Signed)
Requested medication (s) are due for refill today: yes  Requested medication (s) are on the active medication list: yes  Last refill:  10/23/21 #90 with 0 RF   Future visit scheduled: 04/29/22 Rosanna Randy)  Notes to clinic:  Failed protocol of labs within 12 months, lab from 11/2020, had labs at last appt but lipids not ordered,has upcoming appt, please assess.       Requested Prescriptions  Pending Prescriptions Disp Refills   atorvastatin (LIPITOR) 40 MG tablet [Pharmacy Med Name: ATORVASTATIN 40 MG TAB[*]] 90 tablet 0    Sig: TAKE ONE TABLET BY MOUTH ONE TIME DAILY     Cardiovascular:  Antilipid - Statins Failed - 04/03/2022 12:22 PM      Failed - Lipid Panel in normal range within the last 12 months    Cholesterol, Total  Date Value Ref Range Status  12/24/2020 152 100 - 199 mg/dL Final   LDL Cholesterol (Calc)  Date Value Ref Range Status  06/01/2017 50 mg/dL (calc) Final    Comment:    Reference range: <100 . Desirable range <100 mg/dL for primary prevention;   <70 mg/dL for patients with CHD or diabetic patients  with > or = 2 CHD risk factors. Marland Kitchen LDL-C is now calculated using the Martin-Hopkins  calculation, which is a validated novel method providing  better accuracy than the Friedewald equation in the  estimation of LDL-C.  Cresenciano Genre et al. Annamaria Helling. 9563;875(64): 2061-2068  (http://education.[employer].com/faq/FAQ164)    LDL Chol Calc (NIH)  Date Value Ref Range Status  12/24/2020 59 0 - 99 mg/dL Final   HDL  Date Value Ref Range Status  12/24/2020 73 >39 mg/dL Final   Triglycerides  Date Value Ref Range Status  12/24/2020 112 0 - 149 mg/dL Final         Passed - Patient is not pregnant      Passed - Valid encounter within last 12 months    Recent Outpatient Visits           5 months ago Annual physical exam   [facility] Jerrol Banana., MD   1 year ago Essential (primary) hypertension   [facility]  Jerrol Banana., MD   1 year ago Essential (primary) hypertension   [facility] Jerrol Banana., MD   1 year ago Essential (primary) hypertension   [facility] Jerrol Banana., MD   1 year ago Essential (primary) hypertension   [facility] Jerrol Banana., MD       Future Appointments             In 3 weeks Jerrol Banana., MD [facility], [place]

## 2022-04-09 ENCOUNTER — Telehealth: Payer: Self-pay | Admitting: Family Medicine

## 2022-04-09 ENCOUNTER — Ambulatory Visit: Payer: Self-pay | Admitting: *Deleted

## 2022-04-09 DIAGNOSIS — J0100 Acute maxillary sinusitis, unspecified: Secondary | ICD-10-CM

## 2022-04-09 MED ORDER — CEFDINIR 300 MG PO CAPS
300.0000 mg | ORAL_CAPSULE | Freq: Two times a day (BID) | ORAL | 0 refills | Status: AC
Start: 2022-04-09 — End: 2022-04-19

## 2022-04-09 MED ORDER — BENZONATATE 200 MG PO CAPS: 200.0000 mg | ORAL_CAPSULE | Freq: Three times a day (TID) | ORAL | 0 refills | Status: AC | PRN

## 2022-04-09 NOTE — Progress Notes (Signed)
Virtual Visit Consent   Peter [name], you are scheduled for a virtual visit with a [place] provider today. Just as with appointments in the office, your consent must be obtained to participate. Your consent will be active for this visit and any virtual visit you may have with one of our providers in the next 365 days. If you have a MyChart account, a copy of this consent can be sent to you electronically.  As this is a virtual visit, video technology does not allow for your provider to perform a traditional examination. This may limit your provider's ability to fully assess your condition. If your provider identifies any concerns that need to be evaluated in person or the need to arrange testing (such as labs, EKG, etc.), we will make arrangements to do so. Although advances in technology are sophisticated, we cannot ensure that it will always work on either your end or our end. If the connection with a video visit is poor, the visit may have to be switched to a telephone visit. With either a video or telephone visit, we are not always able to ensure that we have a secure connection.  By engaging in this virtual visit, you consent to the provision of healthcare and authorize for your insurance to be billed (if applicable) for the services provided during this visit. Depending on your insurance coverage, you may receive a charge related to this service.  I need to obtain your verbal consent now. Are you willing to proceed with your visit today? Peter [name] has provided verbal consent on 04/09/2022 for a virtual visit (video or telephone). Dellia Nims, FNP  Date: 04/09/2022 12:34 PM  Virtual Visit via Video Note   I, Dellia Nims, connected with  Peter [name]  ([id number], 24-Aug-1960) on 04/09/22 at 12:30 PM EDT by a video-enabled telemedicine application and verified that I am speaking with the correct person using two identifiers.  Location: Patient: Virtual Visit Location Patient:  Home Provider: Virtual Visit Location Provider: Home Office   I discussed the limitations of evaluation and management by telemedicine and the availability of in person appointments. The patient expressed understanding and agreed to proceed.    History of Present Illness: Peter [name] is a 61 y.o. who identifies as a male who was assigned male at birth, and is being seen today for sinus pain and pressure with post nasal drainage, slight cough, no fever wheezing or chills. No covid testing. Sx for 5 days. Yellos mucus worsening.   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   Necrotizing soft tissue infection 11/11/2016   Class 3 severe obesity due to excess calories with serious comorbidity and body mass index (BMI) of 45.0 to 49.9 in adult ([place]) 09/21/2016   Decreased libido 09/21/2016   Shortness of breath 06/15/2016   Abnormal stress test 06/15/2016   Testicular hypofunction 03/25/2015   Avitaminosis D 03/25/2015   Blood in stool    Clinical depression 06/21/2011   Cannot sleep 08/22/2009  Hypercholesterolemia without hypertriglyceridemia 01/28/2009   Hay fever 10/22/2008   Adjustment disorder with mixed anxiety and depressed mood 12/12/2007   Essential (primary) hypertension 12/12/2007   Apnea, sleep 12/12/2007    Allergies:  Allergies  Allergen Reactions   Oxycodone Other (See Comments)    Causes Hallucinations, auditory and tactile   Isosorbide Nausea And Vomiting     Headache, nausea, vomiting   Medications:  Current Outpatient Medications:    ALPRAZolam (XANAX) 0.5 MG tablet, TAKE 1 TABLET(0.5 MG) BY MOUTH TWICE DAILY AS NEEDED FOR ANXIETY, Disp: 60 tablet, Rfl: 1   amLODipine (NORVASC) 10 MG tablet, TAKE ONE TABLET BY MOUTH ONE TIME DAILY, Disp: 90 tablet, Rfl: 1   aspirin EC 81 MG tablet, Take 81 mg by mouth daily., Disp: , Rfl:    atorvastatin (LIPITOR) 40 MG tablet, TAKE ONE TABLET BY MOUTH ONE TIME DAILY, Disp: 90 tablet, Rfl: 0   b complex vitamins capsule, Take 1 capsule by mouth daily., Disp: 30 capsule, Rfl: 11   carbamazepine (TEGRETOL) 200 MG tablet, TAKE THREE TABLETS BY MOUTH EVERY MORNING AND TAKE TWO TABLETS BY MOUTH AT BEDTIME, Disp: 450 tablet, Rfl: 1   chlorthalidone (HYGROTON) 25 MG tablet, TAKE ONE TABLET BY MOUTH ONE TIME DAILY, Disp: 90 tablet, Rfl: 1   Cholecalciferol (NAT-RUL VITAMIN D) 25 MCG (1000 UT) tablet, Take 1,000 Units by mouth daily., Disp: , Rfl:    cloNIDine (CATAPRES) 0.2 MG tablet, TAKE ONE TABLET BY MOUTH TWICE A DAY, Disp: 60 tablet, Rfl: 3   hydrALAZINE (APRESOLINE) 100 MG tablet, TAKE ONE TABLET BY MOUTH THREE TIMES A DAY, Disp: 270 tablet, Rfl: 1   lamoTRIgine (LAMICTAL) 200 MG tablet, TAKE ONE TABLET BY MOUTH TWICE A DAY, Disp: 60 tablet, Rfl: 2   latanoprost (XALATAN) 0.005 % ophthalmic solution, Place 1 drop into both eyes at bedtime., Disp: , Rfl:    losartan (COZAAR) 100 MG tablet, TAKE ONE TABLET BY MOUTH ONE TIME DAILY, Disp: 90 tablet, Rfl: 1   metoprolol tartrate (LOPRESSOR) 50 MG tablet, TAKE ONE TABLET BY MOUTH TWICE A DAY, Disp: 180 tablet, Rfl: 3   NARCAN 4 MG/0.1ML LIQD nasal spray kit, USE 1 SPRAY IEN UTD PRN (Patient not taking: Reported on 02/23/2021), Disp: , Rfl:    NON FORMULARY, CPAP, Disp: , Rfl:    Omega-3 Fatty Acids (FISH OIL) 1000 MG CAPS, Take 1 capsule by mouth daily. , Disp: , Rfl:    omeprazole (PRILOSEC) 40 MG capsule, Take 1 capsule (40 mg total) by mouth daily before breakfast.,  Disp: 30 capsule, Rfl: 3   potassium chloride (KLOR-CON) 10 MEQ tablet, Take 10 mEq by mouth daily., Disp: , Rfl:    sertraline (ZOLOFT) 100 MG tablet, TAKE ONE TABLET BY MOUTH ONE TIME DAILY, Disp: 90 tablet, Rfl: 0   vitamin B-12 (CYANOCOBALAMIN) 1000 MCG tablet, Take 1,000 mcg by mouth daily. , Disp: , Rfl:   Observations/Objective: Patient is well-developed, well-nourished in no acute distress.  Resting comfortably  at home.  Head is normocephalic, atraumatic.  No labored breathing.  Speech is clear and coherent with logical content.  Patient is alert and oriented at baseline.    Assessment and Plan: 1. Acute non-recurrent maxillary sinusitis  Increase fluids, warm salt water gargles, tylenol or ibuprofen as directed, urgent care if sx persist or worsen.   Follow Up Instructions: I discussed the assessment and treatment plan with the patient. The patient was provided an opportunity to ask questions and all were answered. The  patient agreed with the plan and demonstrated an understanding of the instructions.  A copy of instructions were sent to the patient via MyChart unless otherwise noted below.     The patient was advised to call back or seek an in-person evaluation if the symptoms worsen or if the condition fails to improve as anticipated.  Time:  I spent 10 minutes with the patient via telehealth technology discussing the above problems/concerns.    Dellia Nims, FNP

## 2022-04-09 NOTE — Patient Instructions (Signed)

## 2022-04-09 NOTE — Telephone Encounter (Signed)
  Chief Complaint: sinus symptoms Symptoms: nasal congestion/pressure, sore throat Frequency: started Wednesday Pertinent Negatives: Patient denies fever Disposition: '[]'$ ED /'[]'$ Urgent Care (no appt availability in office) / '[]'$ Appointment(In office/virtual)/ '[x]'$  [facility]/ '[]'$ Home Care/ '[]'$ Refused Recommended Disposition /'[]'$  Mobile Bus/ '[]'$  Follow-up with PCP Additional Notes: Patient states he will schedule himself for virtual visit/UC

## 2022-04-09 NOTE — Telephone Encounter (Signed)
agreed

## 2022-04-09 NOTE — Telephone Encounter (Signed)
Summary: Cough & Head congestion Advice   Pt is calling to see if he can get an appt for cough, & Head congestion. No available appts. Please advise      Reason for Disposition  [1] Using nasal washes and pain medicine > 24 hours AND [2] sinus pain (around cheekbone or eye) persists  Answer Assessment - Initial Assessment Questions 1. LOCATION: "Where does it hurt?"      Sinus pressure- upper nose 2. ONSET: "When did the sinus pain start?"  (e.g., hours, days)      Wednesday 3. SEVERITY: "How bad is the pain?"   (Scale 1-10; mild, moderate or severe)   - MILD (1-3): doesn't interfere with normal activities    - MODERATE (4-7): interferes with normal activities (e.g., work or school) or awakens from sleep   - SEVERE (8-10): excruciating pain and patient unable to do any normal activities        moderate 4. RECURRENT SYMPTOM: "Have you ever had sinus problems before?" If Yes, ask: "When was the last time?" and "What happened that time?"      Yes- seasonal- antibiotic 5. NASAL CONGESTION: "Is the nose blocked?" If Yes, ask: "Can you open it or must you breathe through your mouth?"     yes 6. NASAL DISCHARGE: "Do you have discharge from your nose?" If so ask, "What color?"     Yes- yellow 7. FEVER: "Do you have a fever?" If Yes, ask: "What is it, how was it measured, and when did it start?"      98.3- today- no fever 8. OTHER SYMPTOMS: "Do you have any other symptoms?" (e.g., sore throat, cough, earache, difficulty breathing)     Sore throat, difficulty breathing due to congestion 9. PREGNANCY: "Is there any chance you are pregnant?" "When was your last menstrual period?"     *No Answer*  Protocols used: Sinus Pain or Congestion-A-AH

## 2022-04-19 ENCOUNTER — Other Ambulatory Visit: Payer: Self-pay | Admitting: Family Medicine

## 2022-04-19 DIAGNOSIS — I10 Essential (primary) hypertension: Secondary | ICD-10-CM

## 2022-04-20 NOTE — Telephone Encounter (Signed)
Called and scheduled him with Dr. Alba Cory for 11/32023 at 11:00 for 6 month check/refills.    I canceled the appt. He had with Dr. Rosanna Randy for next week since he is no longer with [facility].  A 30 day courtesy supply of losartan 100 mg tablets given.

## 2022-04-24 ENCOUNTER — Other Ambulatory Visit: Payer: Self-pay | Admitting: Family Medicine

## 2022-04-24 DIAGNOSIS — I10 Essential (primary) hypertension: Secondary | ICD-10-CM

## 2022-04-29 ENCOUNTER — Ambulatory Visit: Payer: Managed Care, Other (non HMO) | Admitting: Family Medicine

## 2022-05-06 NOTE — Progress Notes (Signed)
I,[name],acting as a scribe for [employer], MD.,have documented all relevant documentation on the behalf of Peter Foster, MD,as directed by  Peter Foster, MD while in the presence of Peter Foster, MD.   Established patient visit   Patient: Peter [name]   DOB: 10-16-1960   61 y.o. Male  MRN: [id number] Visit Date: 05/07/2022  Today's healthcare provider: [name], MD   Chief Complaint  Patient presents with   Follow-Up Chronic Disease   Subjective    HPI  Hypertension, follow-up  BP Readings from Last 3 Encounters:  05/07/22 118/76  10/22/21 121/76  02/23/21 138/72   Wt Readings from Last 3 Encounters:  05/07/22 300 lb (136.1 kg)  10/22/21 (!) 311 lb (141.1 kg)  02/23/21 (!) 314 lb (142.4 kg)      Patient currently on on amlodipine 50m, clonidine twice a day, hydralazine 3 times a day, metoprolol & losartan   He reports excellent compliance with treatment. He is not having side effects.  He is following a Regular diet. He is exercising.Walks the dog. He does not smoke.  Outside blood pressures are not being checked lately. Symptoms: No chest pain No chest pressure  No palpitations No syncope  No dyspnea No orthopnea  No paroxysmal nocturnal dyspnea No lower extremity edema   Pertinent labs Lab Results  Component Value Date   CHOL 152 12/24/2020   HDL 73 12/24/2020   LDLCALC 59 12/24/2020   TRIG 112 12/24/2020   CHOLHDL 2.1 12/24/2020   Lab Results  Component Value Date   NA 146 (H) 10/22/2021   K 3.3 (L) 10/22/2021   CREATININE 0.96 10/22/2021   EGFR 89 10/22/2021   GLUCOSE 133 (H) 10/22/2021   TSH 2.280 10/22/2021     The 10-year ASCVD risk score (Arnett DK, et al., 2019) is: 7.1%  ---------------------------------------------------------------------------------------------------  Lipid/Cholesterol, Follow-up  Last lipid panel Other pertinent labs  Lab Results   Component Value Date   CHOL 152 12/24/2020   HDL 73 12/24/2020   LDLCALC 59 12/24/2020   TRIG 112 12/24/2020   CHOLHDL 2.1 12/24/2020   Lab Results  Component Value Date   ALT 17 10/22/2021   AST 21 10/22/2021   PLT 224 10/22/2021   TSH 2.280 10/22/2021      Management since that visit includes daily atorvastatin 40, high intensity statin therapy. His LDL is within goal range   He reports excellent compliance with treatment. He is not having side effects.   Symptoms:     No lower extremity edema  No numbness or tingling of extremity No orthopnea  No palpitations No paroxysmal nocturnal dyspnea  No speech difficulty No syncope     The 10-year ASCVD risk score (Arnett DK, et al., 2019) is: 7.1%  ---------------------------------------------------------------------------------------------------  Medications: Outpatient Medications Prior to Visit  Medication Sig   amLODipine (NORVASC) 10 MG tablet TAKE ONE TABLET BY MOUTH ONE TIME DAILY   aspirin EC 81 MG tablet Take 81 mg by mouth daily.   atorvastatin (LIPITOR) 40 MG tablet TAKE ONE TABLET BY MOUTH ONE TIME DAILY   b complex vitamins capsule Take 1 capsule by mouth daily.   carbamazepine (TEGRETOL) 200 MG tablet TAKE THREE TABLETS BY MOUTH EVERY MORNING AND TAKE TWO TABLETS BY MOUTH AT BEDTIME   chlorthalidone (HYGROTON) 25 MG tablet TAKE ONE TABLET BY MOUTH ONE TIME DAILY   Cholecalciferol (NAT-RUL VITAMIN D) 25 MCG (1000 UT) tablet Take 1,000 Units by mouth daily.  cloNIDine (CATAPRES) 0.2 MG tablet TAKE ONE TABLET BY MOUTH TWICE A DAY   GARLIC PO Take by mouth. Kyolic 197   hydrALAZINE (APRESOLINE) 100 MG tablet TAKE ONE TABLET BY MOUTH THREE TIMES A DAY   lamoTRIgine (LAMICTAL) 200 MG tablet TAKE ONE TABLET BY MOUTH TWICE A DAY   latanoprost (XALATAN) 0.005 % ophthalmic solution Place 1 drop into both eyes at bedtime.   losartan (COZAAR) 100 MG tablet TAKE ONE TABLET BY MOUTH ONE TIME DAILY   meloxicam (MOBIC) 15  MG tablet Take 15 mg by mouth daily.   metoprolol tartrate (LOPRESSOR) 50 MG tablet TAKE ONE TABLET BY MOUTH TWICE A DAY   NON FORMULARY CPAP   Omega-3 Fatty Acids (FISH OIL) 1000 MG CAPS Take 1 capsule by mouth daily.    omeprazole (PRILOSEC) 40 MG capsule Take 1 capsule (40 mg total) by mouth daily before breakfast.   potassium chloride (KLOR-CON) 10 MEQ tablet Take 10 mEq by mouth daily.   sertraline (ZOLOFT) 100 MG tablet TAKE ONE TABLET BY MOUTH ONE TIME DAILY   vitamin B-12 (CYANOCOBALAMIN) 1000 MCG tablet Take 1,000 mcg by mouth daily.    ALPRAZolam (XANAX) 0.5 MG tablet TAKE 1 TABLET(0.5 MG) BY MOUTH TWICE DAILY AS NEEDED FOR ANXIETY (Patient not taking: Reported on 05/07/2022)   NARCAN 4 MG/0.1ML LIQD nasal spray kit USE 1 SPRAY IEN UTD PRN (Patient not taking: Reported on 02/23/2021)   No facility-administered medications prior to visit.    Review of Systems     Objective    BP 118/76 (BP Location: Left Arm, Patient Position: Sitting, Cuff Size: Large)   Pulse 62   Temp 98.3 F (36.8 C) (Oral)   Resp 16   Ht _0  (1.702 m)   Wt 300 lb (136.1 kg)   BMI 46.99 kg/m    Physical Exam Vitals reviewed.  Constitutional:      General: He is not in acute distress.    Appearance: Normal appearance. He is not ill-appearing, toxic-appearing or diaphoretic.  Eyes:     General: No scleral icterus.    Conjunctiva/sclera: Conjunctivae normal.  Cardiovascular:     Rate and Rhythm: Normal rate and regular rhythm.     Pulses: Normal pulses.     Heart sounds: Normal heart sounds. No murmur heard.    No friction rub. No gallop.  Pulmonary:     Effort: Pulmonary effort is normal. No respiratory distress.     Breath sounds: Normal breath sounds. No stridor. No wheezing, rhonchi or rales.  Abdominal:     General: Bowel sounds are normal. There is no distension.     Palpations: Abdomen is soft.     Tenderness: There is no abdominal tenderness.     Comments: Midline post surgical  changes with firmness inferior to surgical scar   Musculoskeletal:     Right lower leg: Edema present.     Left lower leg: Edema present.     Comments: Trace bilateral LE edema   Skin:    Findings: No erythema or rash.  Neurological:     Mental Status: He is alert and oriented to person, place, and time.  Psychiatric:        Mood and Affect: Mood normal.        Behavior: Behavior normal.       No results found for any visits on 05/07/22.   Assessment & Plan     Problem List Items Addressed This Visit  Cardiovascular and Mediastinum   Essential (primary) hypertension - Primary    Chronic, controlled Continue current medications at current doses CMP ordered, he will return for lab draw within the next week  Follow-up 6 months       Relevant Orders   Comprehensive metabolic panel     Respiratory   Apnea, sleep    CMP & CBC ordered       Relevant Orders   Comprehensive metabolic panel   CBC     Endocrine   Testicular hypofunction    Considering that some of patient's fatigue may be related to this We will collect testosterone levels as well as other vitamin levels including vitamin D and vitamin B12 as well as a CBC      Relevant Orders   Testosterone,Free and Total     Other   Hypercholesterolemia without hypertriglyceridemia    Stable Last ASCVD risk less than 7% He will continue atorvastatin 40 mg daily Patient to follow-up in 6 months We will collect lipid panel within the next week so that patient can fast prior to blood work      Relevant Orders   Lipid panel   Other fatigue    Patient on multiple medications, also given his history of testicular hypofunction we will measure testosterone levels, CBC, vitamin B 12 and vitamin D levels we will plan follow-up based on patient's results       Relevant Orders   TSH + free T4   Testosterone,Free and Total   Vitamin D (25 hydroxy)   Vitamin B12   CBC   Healthcare maintenance    Patient is  up-to-date on annual influenza vaccine He is recommended to go to pharmacy for COVID vaccination       Other Visit Diagnoses     Need for immunization against influenza       Relevant Orders   Flu Vaccine QUAD 6+ mos PF IM (Fluarix Quad PF) (Completed)        Return in about 6 months (around 11/05/2022) for BP, fatigue .      Cain Sieve Simmons-Robinson MD, have reviewed all documentation for this visit. The documentation for the exam, diagnosis, procedures, and orders are all accurate and complete.   Peter Foster, MD  [facility] [phone] (phone) [phone] (fax)  [place]

## 2022-05-07 ENCOUNTER — Encounter: Payer: Self-pay | Admitting: Family Medicine

## 2022-05-07 ENCOUNTER — Ambulatory Visit (INDEPENDENT_AMBULATORY_CARE_PROVIDER_SITE_OTHER): Payer: Commercial Managed Care - HMO | Admitting: Family Medicine

## 2022-05-07 VITALS — BP 118/76 | HR 62 | Temp 98.3°F | Resp 16 | Ht 67.0 in | Wt 300.0 lb

## 2022-05-07 DIAGNOSIS — I10 Essential (primary) hypertension: Secondary | ICD-10-CM | POA: Diagnosis not present

## 2022-05-07 DIAGNOSIS — Z23 Encounter for immunization: Secondary | ICD-10-CM

## 2022-05-07 DIAGNOSIS — E7800 Pure hypercholesterolemia, unspecified: Secondary | ICD-10-CM | POA: Diagnosis not present

## 2022-05-07 DIAGNOSIS — E291 Testicular hypofunction: Secondary | ICD-10-CM

## 2022-05-07 DIAGNOSIS — Z0000 Encounter for general adult medical examination without abnormal findings: Secondary | ICD-10-CM

## 2022-05-07 DIAGNOSIS — R5383 Other fatigue: Secondary | ICD-10-CM | POA: Diagnosis not present

## 2022-05-07 DIAGNOSIS — G4733 Obstructive sleep apnea (adult) (pediatric): Secondary | ICD-10-CM

## 2022-05-07 NOTE — Assessment & Plan Note (Signed)
Patient on multiple medications, also given his history of testicular hypofunction we will measure testosterone levels, CBC, vitamin B 12 and vitamin D levels we will plan follow-up based on patient's results

## 2022-05-07 NOTE — Patient Instructions (Signed)
Please return to our office between 8AM-11:30 AM or 1PM - 4:30 PM for your labs  We will order testosterone and other vitamin levels that could be contributing to your low energy levels   We will also check liver, kidney function and electrolyte levels as your potassium was mildly low one year ago.   Your blood pressure is well controlled so you can continue to take your medications as prescribed    We will plan on following up in 6 months for chronic conditions and if need be, sooner depending on the lab results.    Best,   Dr. Quentin Cornwall

## 2022-05-07 NOTE — Assessment & Plan Note (Signed)
Considering that some of patient's fatigue may be related to this We will collect testosterone levels as well as other vitamin levels including vitamin D and vitamin B12 as well as a CBC

## 2022-05-07 NOTE — Assessment & Plan Note (Signed)
Patient is up-to-date on annual influenza vaccine He is recommended to go to pharmacy for [place] vaccination

## 2022-05-07 NOTE — Assessment & Plan Note (Signed)
Chronic, controlled Continue current medications at current doses CMP ordered, he will return for lab draw within the next week  Follow-up 6 months

## 2022-05-07 NOTE — Assessment & Plan Note (Signed)
CMP & CBC ordered

## 2022-05-07 NOTE — Assessment & Plan Note (Signed)
Stable Last ASCVD risk less than 7% He will continue atorvastatin 40 mg daily Patient to follow-up in 6 months We will collect lipid panel within the next week so that patient can fast prior to blood work

## 2022-05-21 ENCOUNTER — Encounter: Payer: Self-pay | Admitting: Physician Assistant

## 2022-05-21 ENCOUNTER — Ambulatory Visit: Payer: Commercial Managed Care - HMO | Admitting: Physician Assistant

## 2022-05-21 DIAGNOSIS — R5383 Other fatigue: Secondary | ICD-10-CM | POA: Diagnosis not present

## 2022-05-21 DIAGNOSIS — F319 Bipolar disorder, unspecified: Secondary | ICD-10-CM | POA: Diagnosis not present

## 2022-05-21 DIAGNOSIS — Z79899 Other long term (current) drug therapy: Secondary | ICD-10-CM

## 2022-05-21 NOTE — Progress Notes (Unsigned)
[facility]  Patient ID: Peter [name],  MRN: [id number]  PCP: Jerrol Banana., MD  Date of Evaluation: 05/21/2022 Time spent:30 minutes  Chief Complaint:  Chief Complaint   Depression; Follow-up    HISTORY/CURRENT STATUS: For routine med check.  Wife [name] is with him.   His meds are working well.  Wife verbalizes concern over BP and all the meds he's having to take. She's concerned about long term effects of the meds. He's had no SE that he's aware of.   Patient is able to enjoy things.  Energy and motivation are fair to good. Is tired a lot but not worse.   Work is going well.   No extreme sadness, tearfulness, or feelings of hopelessness.  Sleeps well most of the time. ADLs and personal hygiene are normal.   Denies any changes in concentration, making decisions, or remembering things.  Appetite has not changed.  Weight is stable. Not isolating. He rarely takes Xanax, not having a lot of anxiety. No PA.  Denies suicidal or homicidal thoughts.  Patient denies increased energy with decreased need for sleep, increased talkativeness, racing thoughts, impulsivity or risky behaviors, increased spending, increased libido, grandiosity, increased irritability or anger, paranoia, or hallucinations.  His PCP left the practice so he was seen by another provider not long ago.They ordered labs but he hasn't had them done yet.   Denies dizziness, syncope, seizures, numbness, tingling, tremor, tics, unsteady gait, slurred speech, confusion. Denies muscle or joint pain, stiffness, or dystonia. Denies unexplained weight loss, frequent infections, or sores that heal slowly.  No polyphagia, polydipsia, or polyuria. Denies visual changes or paresthesias.   Individual Medical History/ Review of Systems: Changes? :No    Past medications for mental health diagnoses include: Zoloft, Cymbalta did not help, Pristiq did not help, Seroquel, Xanax, Depakote, Lamictal, Equetro,  Klonopin, Modafinil didn't help energy.   Allergies: Oxycodone and Isosorbide  Current Medications:  Current Outpatient Medications:    amLODipine (NORVASC) 10 MG tablet, TAKE ONE TABLET BY MOUTH ONE TIME DAILY, Disp: 90 tablet, Rfl: 0   aspirin EC 81 MG tablet, Take 81 mg by mouth daily., Disp: , Rfl:    atorvastatin (LIPITOR) 40 MG tablet, TAKE ONE TABLET BY MOUTH ONE TIME DAILY, Disp: 90 tablet, Rfl: 0   b complex vitamins capsule, Take 1 capsule by mouth daily., Disp: 30 capsule, Rfl: 11   carbamazepine (TEGRETOL) 200 MG tablet, TAKE THREE TABLETS BY MOUTH EVERY MORNING AND TAKE TWO TABLETS BY MOUTH AT BEDTIME, Disp: 450 tablet, Rfl: 1   chlorthalidone (HYGROTON) 25 MG tablet, TAKE ONE TABLET BY MOUTH ONE TIME DAILY, Disp: 90 tablet, Rfl: 0   Cholecalciferol (NAT-RUL VITAMIN D) 25 MCG (1000 UT) tablet, Take 1,000 Units by mouth daily., Disp: , Rfl:    cloNIDine (CATAPRES) 0.2 MG tablet, TAKE ONE TABLET BY MOUTH TWICE A DAY, Disp: 60 tablet, Rfl: 3   GARLIC PO, Take by mouth. Kyolic 673, Disp: , Rfl:    hydrALAZINE (APRESOLINE) 100 MG tablet, TAKE ONE TABLET BY MOUTH THREE TIMES A DAY, Disp: 270 tablet, Rfl: 1   lamoTRIgine (LAMICTAL) 200 MG tablet, TAKE ONE TABLET BY MOUTH TWICE A DAY, Disp: 60 tablet, Rfl: 2   latanoprost (XALATAN) 0.005 % ophthalmic solution, Place 1 drop into both eyes at bedtime., Disp: , Rfl:    losartan (COZAAR) 100 MG tablet, TAKE ONE TABLET BY MOUTH ONE TIME DAILY, Disp: 30 tablet, Rfl: 0   meloxicam (MOBIC) 15 MG tablet,  Take 15 mg by mouth daily., Disp: , Rfl:    metoprolol tartrate (LOPRESSOR) 50 MG tablet, TAKE ONE TABLET BY MOUTH TWICE A DAY, Disp: 180 tablet, Rfl: 3   NON FORMULARY, CPAP, Disp: , Rfl:    Omega-3 Fatty Acids (FISH OIL) 1000 MG CAPS, Take 1 capsule by mouth daily. , Disp: , Rfl:    potassium chloride (KLOR-CON) 10 MEQ tablet, Take 10 mEq by mouth daily., Disp: , Rfl:    sertraline (ZOLOFT) 100 MG tablet, TAKE ONE TABLET BY MOUTH ONE TIME  DAILY, Disp: 90 tablet, Rfl: 0   vitamin B-12 (CYANOCOBALAMIN) 1000 MCG tablet, Take 1,000 mcg by mouth daily. , Disp: , Rfl:    ALPRAZolam (XANAX) 0.5 MG tablet, TAKE 1 TABLET(0.5 MG) BY MOUTH TWICE DAILY AS NEEDED FOR ANXIETY (Patient not taking: Reported on 05/07/2022), Disp: 60 tablet, Rfl: 1   NARCAN 4 MG/0.1ML LIQD nasal spray kit, USE 1 SPRAY IEN UTD PRN (Patient not taking: Reported on 02/23/2021), Disp: , Rfl:    omeprazole (PRILOSEC) 40 MG capsule, Take 1 capsule (40 mg total) by mouth daily before breakfast. (Patient not taking: Reported on 05/21/2022), Disp: 30 capsule, Rfl: 3 Medication Side Effects: none  Family Medical/ Social History: Changes? No  MENTAL HEALTH EXAM:  There were no vitals taken for this visit.There is no height or weight on file to calculate BMI.  General Appearance: Casual, Neat, Well Groomed and Obese  Eye Contact:  Good  Speech:  Clear and Coherent and Normal Rate  Volume:  Normal  Mood:  Euthymic  Affect:  Appropriate  Thought Process:  Goal Directed and Descriptions of Associations: Circumstantial  Orientation:  Full (Time, Place, and Person)  Thought Content: Logical   Suicidal Thoughts:  No  Homicidal Thoughts:  No  Memory:  WNL  Judgement:  Good  Insight:  Good  Psychomotor Activity:  Normal  Concentration:  Concentration: Good and Attention Span: Good  Recall:  Good  Fund of Knowledge: Good  Language: Good  Assets:  Desire for Improvement  ADL's:  Intact  Cognition: WNL  Prognosis:  Good   Labs ordered through PCP on 05/07/2022.  He has not had them drawn yet.  DIAGNOSES:    ICD-10-CM   1. Bipolar I disorder (HCC)  F31.9 Carbamazepine level, total    Lamotrigine level    CBC with Differential/Platelet    Comprehensive metabolic panel    TSH    2. Fatigue, unspecified type  R53.83 CBC with Differential/Platelet    B12 and Folate Panel    VITAMIN D 25 Hydroxy (Vit-D Deficiency, Fractures)    Comprehensive metabolic panel    TSH     3. Encounter for long-term (current) use of medications  Z79.899 Carbamazepine level, total    Lamotrigine level    CBC with Differential/Platelet    B12 and Folate Panel    VITAMIN D 25 Hydroxy (Vit-D Deficiency, Fractures)    Lipid panel    Comprehensive metabolic panel    TSH     Receiving Psychotherapy: No   RECOMMENDATIONS:  PDMP was reviewed.  Last Xanax filled 03/27/2021.  Modafinil filled 05/15/2021. I provided 30 minutes of face to face time during this encounter, including time spent before and after the visit in records review, medical decision making, counseling pertinent to today's visit, and charting.   Disc the meds for mental health. Most meds are metabolized through the liver, and he's getting regular labs to monitor that. He doesn't take the xanax often,  but it can lead to falls, confusion, memory deficits. Hyponatremia can occur, again we watch labs. The benefits outweight the risks for him.   Continue Xanax 0.5 mg, 1 p.o. twice daily as needed. Continue carbamazepine 200 mg, 3 p.o. every morning, 2 p.o. nightly. Continue Clonidine 0.2 mg, bid. Continue Lamictal 200 mg 1 p.o. twice daily.  Continue Zoloft 100 mg, 1 p.o.  daily. Continue multivitamin, B complex, omega-3, vitamin D per med sheet. Carbamazepine level, Lamictal level, CBC w/ diff, B12 Folate. Labs ordered.  Return in 3 months.Donnal Moat, PA-C

## 2022-05-22 ENCOUNTER — Other Ambulatory Visit: Payer: Self-pay | Admitting: Physician Assistant

## 2022-05-24 ENCOUNTER — Telehealth: Payer: Self-pay | Admitting: Family Medicine

## 2022-05-24 DIAGNOSIS — I10 Essential (primary) hypertension: Secondary | ICD-10-CM

## 2022-05-24 MED ORDER — SERTRALINE HCL 100 MG PO TABS: 100.0000 mg | ORAL_TABLET | Freq: Every day | ORAL | 1 refills | Status: DC

## 2022-05-24 MED ORDER — CLONIDINE HCL 0.2 MG PO TABS: 0.2000 mg | ORAL_TABLET | Freq: Two times a day (BID) | ORAL | 1 refills | Status: DC

## 2022-05-24 NOTE — Telephone Encounter (Signed)
[facility] faxed refill request for the following medications:   cloNIDine (CATAPRES) 0.2 MG tablet    sertraline (ZOLOFT) 100 MG tablet    Please advise.

## 2022-06-01 ENCOUNTER — Encounter: Payer: Self-pay | Admitting: Physician Assistant

## 2022-06-02 LAB — CBC WITH DIFFERENTIAL/PLATELET
Basophils Absolute: 0 10*3/uL (ref 0.0–0.2)
Basos: 1 %
EOS (ABSOLUTE): 0.2 10*3/uL (ref 0.0–0.4)
Eos: 2 %
Hematocrit: 41.8 % (ref 37.5–51.0)
Hemoglobin: 14.1 g/dL (ref 13.0–17.7)
Immature Grans (Abs): 0 10*3/uL (ref 0.0–0.1)
Immature Granulocytes: 0 %
Lymphocytes Absolute: 2.2 10*3/uL (ref 0.7–3.1)
Lymphs: 29 %
MCH: 30.1 pg (ref 26.6–33.0)
MCHC: 33.7 g/dL (ref 31.5–35.7)
MCV: 89 fL (ref 79–97)
Monocytes Absolute: 0.5 10*3/uL (ref 0.1–0.9)
Monocytes: 6 %
Neutrophils Absolute: 4.5 10*3/uL (ref 1.4–7.0)
Neutrophils: 62 %
Platelets: 209 10*3/uL (ref 150–450)
RBC: 4.68 x10E6/uL (ref 4.14–5.80)
RDW: 12.5 % (ref 11.6–15.4)
WBC: 7.4 10*3/uL (ref 3.4–10.8)

## 2022-06-02 LAB — CARBAMAZEPINE LEVEL, TOTAL: Carbamazepine (Tegretol), S: 10.6 ug/mL (ref 4.0–12.0)

## 2022-06-02 LAB — COMPREHENSIVE METABOLIC PANEL
ALT: 21 IU/L (ref 0–44)
AST: 26 IU/L (ref 0–40)
Albumin/Globulin Ratio: 1.3 (ref 1.2–2.2)
Albumin: 4 g/dL (ref 3.9–4.9)
Alkaline Phosphatase: 123 IU/L — ABNORMAL HIGH (ref 44–121)
BUN/Creatinine Ratio: 19 (ref 10–24)
BUN: 20 mg/dL (ref 8–27)
Bilirubin Total: 0.5 mg/dL (ref 0.0–1.2)
CO2: 29 mmol/L (ref 20–29)
Calcium: 9.4 mg/dL (ref 8.6–10.2)
Chloride: 99 mmol/L (ref 96–106)
Creatinine, Ser: 1.05 mg/dL (ref 0.76–1.27)
Globulin, Total: 3 g/dL (ref 1.5–4.5)
Glucose: 88 mg/dL (ref 70–99)
Potassium: 3.2 mmol/L — ABNORMAL LOW (ref 3.5–5.2)
Sodium: 143 mmol/L (ref 134–144)
Total Protein: 7 g/dL (ref 6.0–8.5)
eGFR: 80 mL/min/{1.73_m2} (ref >59)

## 2022-06-02 LAB — LIPID PANEL
Chol/HDL Ratio: 2.1 ratio (ref 0.0–5.0)
Cholesterol, Total: 164 mg/dL (ref 100–199)
HDL: 78 mg/dL (ref >39)
LDL Chol Calc (NIH): 65 mg/dL (ref 0–99)
Triglycerides: 121 mg/dL (ref 0–149)
VLDL Cholesterol Cal: 21 mg/dL (ref 5–40)

## 2022-06-02 LAB — B12 AND FOLATE PANEL
Folate: >20 ng/mL (ref >3.0)
Vitamin B-12: 702 pg/mL (ref 232–1245)

## 2022-06-02 LAB — VITAMIN D 25 HYDROXY (VIT D DEFICIENCY, FRACTURES): Vit D, 25-Hydroxy: 30.9 ng/mL (ref 30.0–100.0)

## 2022-06-02 LAB — LAMOTRIGINE LEVEL: Lamotrigine Lvl: 3.4 ug/mL (ref 2.0–20.0)

## 2022-06-02 LAB — TSH: TSH: 3.44 u[IU]/mL (ref 0.450–4.500)

## 2022-06-02 NOTE — Progress Notes (Signed)
Pt informed

## 2022-06-02 NOTE — Progress Notes (Signed)
Please let him know his vitamin D level is low at 30.9.  Have him increase the over-the-counter vitamin D to 4000 IUs daily.  Need to repeat this lab in 3 months. His potassium is a little low but are about the same as it was 7 months and also a year ago.  Have him discussed this with his PCP. Carbamazepine level is in normal range as is Lamictal level.  Continue same doses.   CBC, cholesterol panel, B12 and folate, TSH, glucose, sodium, kidney function, and liver functions are all normal.

## 2022-06-03 ENCOUNTER — Other Ambulatory Visit: Payer: Self-pay | Admitting: Family Medicine

## 2022-06-03 DIAGNOSIS — I10 Essential (primary) hypertension: Secondary | ICD-10-CM

## 2022-06-03 MED ORDER — LOSARTAN POTASSIUM 100 MG PO TABS: 100.0000 mg | ORAL_TABLET | Freq: Every day | ORAL | 1 refills | Status: DC

## 2022-06-03 NOTE — Telephone Encounter (Signed)
Requested Prescriptions  Pending Prescriptions Disp Refills   losartan (COZAAR) 100 MG tablet 90 tablet 1    Sig: Take 1 tablet (100 mg total) by mouth daily.     Cardiovascular:  Angiotensin Receptor Blockers Failed - 06/03/2022 10:03 AM      Failed - K in normal range and within 180 days    Potassium  Date Value Ref Range Status  05/31/2022 3.2 (L) 3.5 - 5.2 mmol/L Final         Passed - Cr in normal range and within 180 days    Creat  Date Value Ref Range Status  06/21/2017 0.83 0.70 - 1.33 mg/dL Final    Comment:    For patients >77 years of age, the reference limit for Creatinine is approximately 13% higher for people identified as African-American. .    Creatinine, Ser  Date Value Ref Range Status  05/31/2022 1.05 0.76 - 1.27 mg/dL Final         Passed - Patient is not pregnant      Passed - Last BP in normal range    BP Readings from Last 1 Encounters:  05/07/22 118/76         Passed - Valid encounter within last 6 months    Recent Outpatient Visits           3 weeks ago Essential (primary) hypertension   [facility] Simmons-Robinson, [place], MD   7 months ago Annual physical exam   [facility] Jerrol Banana., [facility]   1 year ago Essential (primary) hypertension   [facility] Jerrol Ban[facility]., MD   1 year ago Essential (primary) hypertension   [facility] Jerrol Banana., MD   1 year ago Essential (primary) hypertension   [facility] Jerrol Banana., MD       Future Appointments             In 5 months Simmons-Robinson, Riki Sheer, MD [facility], [place]

## 2022-06-03 NOTE — Telephone Encounter (Signed)
Medication Refill - Medication: losartan (COZAAR) 100 MG tablet   Pt stated pharmacy sent in refill request.  Pt stated he is out of medication.      Has the patient contacted their pharmacy? Yes.   No, more refills.    (Agent: If yes, when and what did the pharmacy advise?)  Preferred Pharmacy (with phone number or street name):  [place] [place] [facility] AT [address]  [place] [address]  Phone: [phone] Fax: [phone]  Hours: Not open 24 hours   Has the patient been seen for an appointment in the last year OR does the patient have an upcoming appointment? Yes.    Agent: Please be advised that RX refills may take up to 3 business days. We ask that you follow-up with your pharmacy.

## 2022-07-22 ENCOUNTER — Other Ambulatory Visit: Payer: Self-pay

## 2022-07-22 ENCOUNTER — Telehealth: Payer: Self-pay | Admitting: Physician Assistant

## 2022-07-22 ENCOUNTER — Other Ambulatory Visit: Payer: Self-pay | Admitting: Family Medicine

## 2022-07-22 DIAGNOSIS — E7800 Pure hypercholesterolemia, unspecified: Secondary | ICD-10-CM

## 2022-07-22 DIAGNOSIS — I10 Essential (primary) hypertension: Secondary | ICD-10-CM

## 2022-07-22 MED ORDER — AMLODIPINE BESYLATE 10 MG PO TABS: 10.0000 mg | ORAL_TABLET | Freq: Every day | ORAL | 1 refills | Status: DC

## 2022-07-22 MED ORDER — ATORVASTATIN CALCIUM 40 MG PO TABS: 40.0000 mg | ORAL_TABLET | Freq: Every day | ORAL | 2 refills | Status: AC

## 2022-07-22 MED ORDER — CARBAMAZEPINE 200 MG PO TABS: ORAL_TABLET | ORAL | 0 refills | Status: DC

## 2022-07-22 MED ORDER — METOPROLOL TARTRATE 50 MG PO TABS: 50.0000 mg | ORAL_TABLET | Freq: Two times a day (BID) | ORAL | 1 refills | Status: DC

## 2022-07-22 NOTE — Telephone Encounter (Signed)
Pt has changed pharmacies and needs new refills for amLODipine (NORVASC) 10 MG tablet [[id number]]   atorvastatin (LIPITOR) 40 MG tablet [[id number]]  metoprolol tartrate (LOPRESSOR) 50 MG tablet [[id number]]  Cholecalciferol (NAT-RUL VITAMIN D) 25 MCG (1000 UT) tablet [[id number]]  [id number]   He is no longer using [facility] due to change of insurance / please advise

## 2022-07-22 NOTE — Telephone Encounter (Signed)
Requested medication (s) are due for refill today: Yes  Requested medication (s) are on the active medication list: Yes  Last refill:  03/17/20  Future visit scheduled: Yes  Notes to clinic:  Unable to refill per protocol, last refill by another provider. Noted in lab results to increase vitamin D to 4000 u/day, will need new Rx sent.     Requested Prescriptions  Pending Prescriptions Disp Refills   Cholecalciferol (NAT-RUL VITAMIN D) 25 MCG (1000 UT) tablet      Sig: Take 1 tablet (1,000 Units total) by mouth daily.     Endocrinology:  Vitamins - Vitamin D Supplementation 2 Failed - 07/22/2022  4:57 PM      Failed - Manual Review: Route requests for 50,000 IU strength to the provider      Passed - Ca in normal range and within 360 days    Calcium  Date Value Ref Range Status  05/31/2022 9.4 8.6 - 10.2 mg/dL Final         Passed - Vitamin D in normal range and within 360 days    Vit D, 25-Hydroxy  Date Value Ref Range Status  05/31/2022 30.9 30.0 - 100.0 ng/mL Final    Comment:    Vitamin D deficiency has been defined by the Institute of Medicine and an Endocrine Society practice guideline as a level of serum 25-OH vitamin D less than 20 ng/mL (1,2). The Endocrine Society went on to further define vitamin D insufficiency as a level between 21 and 29 ng/mL (2). 1. IOM (Institute of Medicine). 2010. Dietary reference    intakes for calcium and D. [place]: The    [employer]. 2. Holick MF, Binkley [place], Bischoff-Ferrari HA, et al.    Evaluation, treatment, and prevention of vitamin D    deficiency: an Endocrine Society clinical practice    guideline. JCEM. 2011 Jul; 96(7):1911-30.          Passed - Valid encounter within last 12 months    Recent Outpatient Visits           2 months ago Essential (primary) hypertension   [facility] Simmons-Robinson, [place], MD   9 months ago Annual physical exam   [facility] Jerrol Banana., MD   1 year ago Essential (primary) hypertension   [facility] Jerrol Banana., MD   1 year ago Essential (primary) hypertension   [facility] Jerrol Banana., MD   1 year ago Essential (primary) hypertension   [facility] Jerrol Banana., MD       Future Appointments             In 3 months Simmons-Robinson, Riki Sheer, MD [facility]            Signed Prescriptions Disp Refills   amLODipine (NORVASC) 10 MG tablet 90 tablet 1    Sig: Take 1 tablet (10 mg total) by mouth daily.     Cardiovascular: Calcium Channel Blockers 2 Passed - 07/22/2022  4:57 PM      Passed - Last BP in normal range    BP Readings from Last 1 Encounters:  05/07/22 118/76         Passed - Last Heart Rate in normal range    Pulse Readings from Last 1 Encounters:  05/07/22 62         Passed - Valid encounter within last 6 months    Recent Outpatient  Visits           2 months ago Essential (primary) hypertension   [facility] Simmons-Robinson, [place], MD   9 months ago Annual physical exam   [facility] Jerrol [facility]   1 year ago Essential (primary) hypertension   [facility] Jerrol Banana., MD   1 year ago Essential (primary) hypertension   [facility] Jerrol [facility]ana., MD   1 year ago Essential (primary) hypertension   [facility] Jerrol Banana., MD       Future Appointments             In 3 months Simmons-Robinson, Riki Sheer, MD [facility], [facility]             atorvastatin (LIPITOR) 40 MG tablet 90 tablet 2    Sig: Take 1 tablet (40 mg total) by mouth daily.     Cardiovascular:  Antilipid - Statins Failed - 07/22/2022  4:57 PM      Failed - Lipid Panel in normal range within the last 12 months    Cholesterol, Total  Date Value Ref Range Status  05/31/2022 164  100 - 199 mg/dL Final   LDL Cholesterol (Calc)  Date Value Ref Range Status  06/01/2017 50 mg/dL (calc) Final    Comment:    Reference range: <100 . Desirable range <100 mg/dL for primary prevention;   <70 mg/dL for patients with CHD or diabetic patients  with > or = 2 CHD risk factors. Marland Kitchen LDL-C is now calculated using the Martin-Hopkins  calculation, which is a validated novel method providing  better accuracy than the Friedewald equation in the  estimation of LDL-C.  Cresenciano Genre et al. Annamaria Helling. 4268;341(96): 2061-2068  (http://education.[employer].com/faq/FAQ164)    LDL Chol Calc (NIH)  Date Value Ref Range Status  05/31/2022 65 0 - 99 mg/dL Final   HDL  Date Value Ref Range Status  05/31/2022 78 >39 mg/dL Final   Triglycerides  Date Value Ref Range Status  05/31/2022 121 0 - 149 mg/dL Final         Passed - Patient is not pregnant      Passed - Valid encounter within last 12 months    Recent Outpatient Visits           2 months ago Essential (primary) hypertension   [employer], [place], MD   9 months ago Annual physical exam   [facility] Jerrol Banana., MD   1 year ago Essential (primary) hypertension   [facility] Jerrol Banana., MD   1 year ago Essential (primary) hypertension   [facility] Jerrol Banana., MD   1 year ago Essential (primary) hypertension   [facility] Jerrol Banana., MD       Future Appointments             In 3 months Simmons-Robinson, Riki Sheer, MD [facility], [facility]             metoprolol tartrate (LOPRESSOR) 50 MG tablet 180 tablet 1    Sig: Take 1 tablet (50 mg total) by mouth 2 (two) times daily.     Cardiovascular:  Beta Blockers Passed - 07/22/2022  4:57 PM      Passed - Last BP in normal range    BP Readings from Last 1 Encounters:  05/07/22 118/76  Passed - Last Heart Rate  in normal range    Pulse Readings from Last 1 Encounters:  05/07/22 62         Passed - Valid encounter within last 6 months    Recent Outpatient Visits           2 months ago Essential (primary) hypertension   [facility] Simmons-Robinson, [place], MD   9 months ago Annual physical exam   [facility] Jerrol Banana., MD   1 year ago Essential (primary) hypertension   [facility] Jerrol Banana., MD   1 year ago Essential (primary) hypertension   [facility] Jerrol Banana., MD   1 year ago Essential (primary) hypertension   [facility] Jerrol Banana., MD       Future Appointments             In 3 months Simmons-Robinson, Riki Sheer, MD [facility], [place]

## 2022-07-22 NOTE — Telephone Encounter (Signed)
Requested Prescriptions  Pending Prescriptions Disp Refills   amLODipine (NORVASC) 10 MG tablet 90 tablet 1    Sig: Take 1 tablet (10 mg total) by mouth daily.     Cardiovascular: Calcium Channel Blockers 2 Passed - 07/22/2022  4:57 PM      Passed - Last BP in normal range    BP Readings from Last 1 Encounters:  05/07/22 118/76         Passed - Last Heart Rate in normal range    Pulse Readings from Last 1 Encounters:  05/07/22 62         Passed - Valid encounter within last 6 months    Recent Outpatient Visits           2 months ago Essential (primary) hypertension   [facility] Simmons-Robinson, [place], MD   9 months ago Annual physical exam   [facility] Jerrol Banana., MD   1 year ago Essential (primary) hypertension   [facility] Jerrol [facility] MD   1 year ago Essential (primary) hypertension   [facility] Jerrol Banana., MD   1 year ago Essential (primary) hypertension   [facility] Jerrol Banana., MD       Future Appointments             In 3 months Simmons-Robinson, Riki Sheer, MD [facility], [facility]             atorvastatin (LIPITOR) 40 MG tablet 90 tablet 2    Sig: Take 1 tablet (40 mg total) by mouth daily.     Cardiovascular:  Antilipid - Statins Failed - 07/22/2022  4:57 PM      Failed - Lipid Panel in normal range within the last 12 months    Cholesterol, Total  Date Value Ref Range Status  05/31/2022 164 100 - 199 mg/dL Final   LDL Cholesterol (Calc)  Date Value Ref Range Status  06/01/2017 50 mg/dL (calc) Final    Comment:    Reference range: <100 . Desirable range <100 mg/dL for primary prevention;   <70 mg/dL for patients with CHD or diabetic patients  with > or = 2 CHD risk factors. Marland Kitchen LDL-C is now calculated using the Martin-Hopkins  calculation, which is a validated novel method providing  better accuracy than the  Friedewald equation in the  estimation of LDL-C.  Cresenciano Genre et al. Annamaria Helling. 3267;124(58): 2061-2068  (http://education.[facility].com/faq/FAQ164)    LDL Chol Calc (NIH)  Date Value Ref Range Status  05/31/2022 65 0 - 99 mg/dL Final   HDL  Date Value Ref Range Status  05/31/2022 78 >39 mg/dL Final   Triglycerides  Date Value Ref Range Status  05/31/2022 121 0 - 149 mg/dL Final         Passed - Patient is not pregnant      Passed - Valid encounter within last 12 months    Recent Outpatient Visits           2 months ago Essential (primary) hypertension   [facility] Simmons-Robinson, [place], MD   9 months ago Annual physical exam   [facility] Jerrol Banana., MD   1 year ago Essential (primary) hypertension   [facility] Jerrol Banana., MD   1 year ago Essential (primary) hypertension   [facility] Jerrol Banana., MD   1 year ago Essential (primary) hypertension   [facility] Jerrol Banana., MD       Future Appointments             In 3 months Simmons-Robinson, Riki Sheer, MD [facility], [facility]             metoprolol tartrate (LOPRESSOR) 50 MG tablet 180 tablet 1    Sig: Take 1 tablet (50 mg total) by mouth 2 (two) times daily.     Cardiovascular:  Beta Blockers Passed - 07/22/2022  4:57 PM      Passed - Last BP in normal range    BP Readings from Last 1 Encounters:  05/07/22 118/76         Passed - Last Heart Rate in normal range    Pulse Readings from Last 1 Encounters:  05/07/22 62         Passed - Valid encounter within last 6 months    Recent Outpatient Visits           2 months ago Essential (primary) hypertension   [facility] Simmons-Robinson, [place], MD   9 months ago Annual physical exam   [facility] Jerrol Banana., MD   1 year ago Essential (primary) hypertension   [facility] Jerrol Banana., MD   1 year ago Essential (primary) hypertension   [facility] Jerrol Banana., MD   1 year ago Essential (primary) hypertension   [facility] Jerrol Banana., MD       Future Appointments             In 3 months Simmons-Robinson, Riki Sheer, MD [facility], [facility]             Cholecalciferol (NAT-RUL VITAMIN D) 25 MCG (1000 UT) tablet      Sig: Take 1 tablet (1,000 Units total) by mouth daily.     Endocrinology:  Vitamins - Vitamin D Supplementation 2 Failed - 07/22/2022  4:57 PM      Failed - Manual Review: Route requests for 50,000 IU strength to the provider      Passed - Ca in normal range and within 360 days    Calcium  Date Value Ref Range Status  05/31/2022 9.4 8.6 - 10.2 mg/dL Final         Passed - Vitamin D in normal range and within 360 days    Vit D, 25-Hydroxy  Date Value Ref Range Status  05/31/2022 30.9 30.0 - 100.0 ng/mL Final    Comment:    Vitamin D deficiency has been defined by the Institute of Medicine and an Endocrine Society practice guideline as a level of serum 25-OH vitamin D less than 20 ng/mL (1,2). The Endocrine Society went on to further define vitamin D insufficiency as a level between 21 and 29 ng/mL (2). 1. IOM (Institute of Medicine). 2010. Dietary reference    intakes for calcium and D. [place]: The    [employer]. 2. Holick MF, Binkley , Bischoff-Ferrari HA, et al.    Evaluation, treatment, and prevention of vitamin D    deficiency: an Endocrine Society clinical practice    guideline. JCEM. 2011 Jul; 96(7):1911-30.          Passed - Valid encounter within last 12 months    Recent Outpatient Visits           2 months ago Essential (primary) hypertension   [facility] Simmons-Robinson, Riki Sheer, MD  9 months ago Annual physical exam   [facility] Jerrol Banana., MD   1 year  ago Essential (primary) hypertension   [facility] Jerrol Banana., MD   1 year ago Essential (primary) hypertension   [facility] Jerrol Banana., MD   1 year ago Essential (primary) hypertension   [facility] Jerrol Banana., MD       Future Appointments             In 3 months Simmons-Robinson, Riki Sheer, MD [facility], [place]

## 2022-07-22 NOTE — Telephone Encounter (Signed)
Pt had to change pharmacies due to insurance. So he needs his carbamazepine 200 mg. [facility] [address] [facility] [place]

## 2022-07-22 NOTE — Telephone Encounter (Signed)
Rx sent 

## 2022-07-23 MED ORDER — NAT-RUL VITAMIN D 25 MCG (1000 UT) PO TABS: 4000.0000 [IU] | ORAL_TABLET | Freq: Every day | ORAL | 0 refills | Status: DC

## 2022-07-27 DIAGNOSIS — J300 Vasomotor rhinitis: Secondary | ICD-10-CM | POA: Diagnosis not present

## 2022-07-27 DIAGNOSIS — J3089 Other allergic rhinitis: Secondary | ICD-10-CM | POA: Diagnosis not present

## 2022-07-28 DIAGNOSIS — Z96652 Presence of left artificial knee joint: Secondary | ICD-10-CM | POA: Diagnosis not present

## 2022-07-28 DIAGNOSIS — M1711 Unilateral primary osteoarthritis, right knee: Secondary | ICD-10-CM | POA: Diagnosis not present

## 2022-07-30 ENCOUNTER — Other Ambulatory Visit: Payer: Self-pay | Admitting: Family Medicine

## 2022-07-30 DIAGNOSIS — I10 Essential (primary) hypertension: Secondary | ICD-10-CM

## 2022-07-30 MED ORDER — CHLORTHALIDONE 25 MG PO TABS: 25.0000 mg | ORAL_TABLET | Freq: Every day | ORAL | 1 refills | Status: DC

## 2022-07-30 NOTE — Telephone Encounter (Signed)
Requested Prescriptions  Pending Prescriptions Disp Refills   chlorthalidone (HYGROTON) 25 MG tablet 90 tablet 1    Sig: Take 1 tablet (25 mg total) by mouth daily.     Cardiovascular: Diuretics - Thiazide Failed - 07/30/2022  5:01 PM      Failed - K in normal range and within 180 days    Potassium  Date Value Ref Range Status  05/31/2022 3.2 (L) 3.5 - 5.2 mmol/L Final         Passed - Cr in normal range and within 180 days    Creat  Date Value Ref Range Status  06/21/2017 0.83 0.70 - 1.33 mg/dL Final    Comment:    For patients >29 years of age, the reference limit for Creatinine is approximately 13% higher for people identified as African-American. .    Creatinine, Ser  Date Value Ref Range Status  05/31/2022 1.05 0.76 - 1.27 mg/dL Final         Passed - Na in normal range and within 180 days    Sodium  Date Value Ref Range Status  05/31/2022 143 134 - 144 mmol/L Final         Passed - Last BP in normal range    BP Readings from Last 1 Encounters:  05/07/22 118/76         Passed - Valid encounter within last 6 months    Recent Outpatient Visits           2 months ago Essential (primary) hypertension   [facility] Simmons-Robinson, Riki Sheer, MD   9 months ago Annual physical exam   [facility] Jerrol Banana., MD   1 year ago Essential (primary) hypertension   [place] Jerrol Banana., MD   1 year ago Essential (primary) hypertension   [place] Jerrol Banana., MD   1 year ago Essential (primary) hypertension   [place] Jerrol Banana., MD       Future Appointments             In 3 months Simmons-Robinson, Riki Sheer, MD [facility]

## 2022-07-30 NOTE — Telephone Encounter (Signed)
Medication Refill - Medication:   Chlorthalidone 25 mg  Has the patient contacted their pharmacy? No. (Agent: If no, request that the patient contact the pharmacy for the refill. If patient does not wish to contact the pharmacy document the reason why and proceed with request.) (Agent: If yes, when and what did the pharmacy advise?)  Preferred Pharmacy (with phone number or street name): [facility] [address] Has the patient been seen for an appointment in the last year OR does the patient have an upcoming appointment? Yes.    Agent: Please be advised that RX refills may take up to 3 business days. We ask that you follow-up with your pharmacy.

## 2022-08-11 DIAGNOSIS — D2261 Melanocytic nevi of right upper limb, including shoulder: Secondary | ICD-10-CM | POA: Diagnosis not present

## 2022-08-11 DIAGNOSIS — D2271 Melanocytic nevi of right lower limb, including hip: Secondary | ICD-10-CM | POA: Diagnosis not present

## 2022-08-11 DIAGNOSIS — B372 Candidiasis of skin and nail: Secondary | ICD-10-CM | POA: Diagnosis not present

## 2022-08-11 DIAGNOSIS — D2272 Melanocytic nevi of left lower limb, including hip: Secondary | ICD-10-CM | POA: Diagnosis not present

## 2022-08-11 DIAGNOSIS — L821 Other seborrheic keratosis: Secondary | ICD-10-CM | POA: Diagnosis not present

## 2022-08-11 DIAGNOSIS — D225 Melanocytic nevi of trunk: Secondary | ICD-10-CM | POA: Diagnosis not present

## 2022-08-11 DIAGNOSIS — D2262 Melanocytic nevi of left upper limb, including shoulder: Secondary | ICD-10-CM | POA: Diagnosis not present

## 2022-08-14 ENCOUNTER — Other Ambulatory Visit: Payer: Self-pay | Admitting: Physician Assistant

## 2022-08-18 ENCOUNTER — Inpatient Hospital Stay
Admission: EM | Admit: 2022-08-18 | Discharge: 2022-08-21 | DRG: 389 | Disposition: A | Discharge status: Home / Self Care | Payer: 59 | Attending: Student | Admitting: Student

## 2022-08-18 ENCOUNTER — Inpatient Hospital Stay: Payer: 59

## 2022-08-18 ENCOUNTER — Emergency Department: Payer: 59

## 2022-08-18 ENCOUNTER — Other Ambulatory Visit: Payer: Self-pay

## 2022-08-18 DIAGNOSIS — Z8719 Personal history of other diseases of the digestive system: Secondary | ICD-10-CM

## 2022-08-18 DIAGNOSIS — K56609 Unspecified intestinal obstruction, unspecified as to partial versus complete obstruction: Secondary | ICD-10-CM | POA: Diagnosis present

## 2022-08-18 DIAGNOSIS — Z791 Long term (current) use of non-steroidal anti-inflammatories (NSAID): Secondary | ICD-10-CM

## 2022-08-18 DIAGNOSIS — Z6841 Body Mass Index (BMI) 40.0 and over, adult: Secondary | ICD-10-CM | POA: Diagnosis not present

## 2022-08-18 DIAGNOSIS — K565 Intestinal adhesions [bands], unspecified as to partial versus complete obstruction: Secondary | ICD-10-CM | POA: Diagnosis not present

## 2022-08-18 DIAGNOSIS — Z888 Allergy status to other drugs, medicaments and biological substances status: Secondary | ICD-10-CM

## 2022-08-18 DIAGNOSIS — Z885 Allergy status to narcotic agent status: Secondary | ICD-10-CM

## 2022-08-18 DIAGNOSIS — H409 Unspecified glaucoma: Secondary | ICD-10-CM | POA: Diagnosis present

## 2022-08-18 DIAGNOSIS — K219 Gastro-esophageal reflux disease without esophagitis: Secondary | ICD-10-CM | POA: Diagnosis present

## 2022-08-18 DIAGNOSIS — Z96652 Presence of left artificial knee joint: Secondary | ICD-10-CM | POA: Diagnosis present

## 2022-08-18 DIAGNOSIS — G4733 Obstructive sleep apnea (adult) (pediatric): Secondary | ICD-10-CM | POA: Diagnosis not present

## 2022-08-18 DIAGNOSIS — Z9049 Acquired absence of other specified parts of digestive tract: Secondary | ICD-10-CM | POA: Diagnosis not present

## 2022-08-18 DIAGNOSIS — E7800 Pure hypercholesterolemia, unspecified: Secondary | ICD-10-CM | POA: Diagnosis present

## 2022-08-18 DIAGNOSIS — Z8249 Family history of ischemic heart disease and other diseases of the circulatory system: Secondary | ICD-10-CM | POA: Diagnosis not present

## 2022-08-18 DIAGNOSIS — M47816 Spondylosis without myelopathy or radiculopathy, lumbar region: Secondary | ICD-10-CM | POA: Diagnosis not present

## 2022-08-18 DIAGNOSIS — I10 Essential (primary) hypertension: Secondary | ICD-10-CM | POA: Diagnosis not present

## 2022-08-18 DIAGNOSIS — Z86010 Personal history of colonic polyps: Secondary | ICD-10-CM | POA: Diagnosis not present

## 2022-08-18 DIAGNOSIS — Z4682 Encounter for fitting and adjustment of non-vascular catheter: Secondary | ICD-10-CM | POA: Diagnosis not present

## 2022-08-18 DIAGNOSIS — Z79899 Other long term (current) drug therapy: Secondary | ICD-10-CM

## 2022-08-18 DIAGNOSIS — E876 Hypokalemia: Secondary | ICD-10-CM | POA: Diagnosis not present

## 2022-08-18 DIAGNOSIS — F319 Bipolar disorder, unspecified: Secondary | ICD-10-CM | POA: Diagnosis not present

## 2022-08-18 DIAGNOSIS — Z7982 Long term (current) use of aspirin: Secondary | ICD-10-CM

## 2022-08-18 DIAGNOSIS — K5651 Intestinal adhesions [bands], with partial obstruction: Principal | ICD-10-CM | POA: Diagnosis present

## 2022-08-18 DIAGNOSIS — Z83438 Family history of other disorder of lipoprotein metabolism and other lipidemia: Secondary | ICD-10-CM

## 2022-08-18 DIAGNOSIS — Z1152 Encounter for screening for COVID-19: Secondary | ICD-10-CM

## 2022-08-18 DIAGNOSIS — Z66 Do not resuscitate: Secondary | ICD-10-CM | POA: Diagnosis present

## 2022-08-18 DIAGNOSIS — J9811 Atelectasis: Secondary | ICD-10-CM | POA: Diagnosis not present

## 2022-08-18 DIAGNOSIS — E66813 Obesity, class 3: Secondary | ICD-10-CM

## 2022-08-18 DIAGNOSIS — N400 Enlarged prostate without lower urinary tract symptoms: Secondary | ICD-10-CM | POA: Diagnosis not present

## 2022-08-18 DIAGNOSIS — R109 Unspecified abdominal pain: Secondary | ICD-10-CM | POA: Diagnosis not present

## 2022-08-18 DIAGNOSIS — Z9989 Dependence on other enabling machines and devices: Secondary | ICD-10-CM

## 2022-08-18 DIAGNOSIS — E869 Volume depletion, unspecified: Secondary | ICD-10-CM | POA: Diagnosis not present

## 2022-08-18 LAB — CBC WITH DIFFERENTIAL/PLATELET
Abs Immature Granulocytes: 0.1 10*3/uL — ABNORMAL HIGH (ref 0.00–0.07)
Basophils Absolute: 0.1 10*3/uL (ref 0.0–0.1)
Basophils Relative: 0 %
Eosinophils Absolute: 0.1 10*3/uL (ref 0.0–0.5)
Eosinophils Relative: 1 %
HCT: 43.7 % (ref 39.0–52.0)
Hemoglobin: 14.9 g/dL (ref 13.0–17.0)
Immature Granulocytes: 1 %
Lymphocytes Relative: 13 %
Lymphs Abs: 2.2 10*3/uL (ref 0.7–4.0)
MCH: 30 pg (ref 26.0–34.0)
MCHC: 34.1 g/dL (ref 30.0–36.0)
MCV: 88.1 fL (ref 80.0–100.0)
Monocytes Absolute: 0.7 10*3/uL (ref 0.1–1.0)
Monocytes Relative: 4 %
Neutro Abs: 13.9 10*3/uL — ABNORMAL HIGH (ref 1.7–7.7)
Neutrophils Relative %: 81 %
Platelets: 209 10*3/uL (ref 150–400)
RBC: 4.96 MIL/uL (ref 4.22–5.81)
RDW: 13 % (ref 11.5–15.5)
WBC: 17 10*3/uL — ABNORMAL HIGH (ref 4.0–10.5)
nRBC: 0 % (ref 0.0–0.2)

## 2022-08-18 LAB — URINALYSIS, ROUTINE W REFLEX MICROSCOPIC
Bilirubin Urine: NEGATIVE
Glucose, UA: NEGATIVE mg/dL
Hgb urine dipstick: NEGATIVE
Ketones, ur: NEGATIVE mg/dL
Leukocytes,Ua: NEGATIVE
Nitrite: NEGATIVE
Protein, ur: 30 mg/dL — AB
Specific Gravity, Urine: 1.025 (ref 1.005–1.030)
pH: 7 (ref 5.0–8.0)

## 2022-08-18 LAB — CBC
HCT: 43.2 % (ref 39.0–52.0)
Hemoglobin: 14.4 g/dL (ref 13.0–17.0)
MCH: 29.8 pg (ref 26.0–34.0)
MCHC: 33.3 g/dL (ref 30.0–36.0)
MCV: 89.3 fL (ref 80.0–100.0)
Platelets: 182 10*3/uL (ref 150–400)
RBC: 4.84 MIL/uL (ref 4.22–5.81)
RDW: 13.1 % (ref 11.5–15.5)
WBC: 14.2 10*3/uL — ABNORMAL HIGH (ref 4.0–10.5)
nRBC: 0 % (ref 0.0–0.2)

## 2022-08-18 LAB — COMPREHENSIVE METABOLIC PANEL
ALT: 28 U/L (ref 0–44)
AST: 32 U/L (ref 15–41)
Albumin: 3.8 g/dL (ref 3.5–5.0)
Alkaline Phosphatase: 100 U/L (ref 38–126)
Anion gap: 11 (ref 5–15)
BUN: 41 mg/dL — ABNORMAL HIGH (ref 8–23)
CO2: 27 mmol/L (ref 22–32)
Calcium: 9.2 mg/dL (ref 8.9–10.3)
Chloride: 101 mmol/L (ref 98–111)
Creatinine, Ser: 1.05 mg/dL (ref 0.61–1.24)
GFR, Estimated: >60 mL/min (ref >60)
Glucose, Bld: 142 mg/dL — ABNORMAL HIGH (ref 70–99)
Potassium: 3.3 mmol/L — ABNORMAL LOW (ref 3.5–5.1)
Sodium: 139 mmol/L (ref 135–145)
Total Bilirubin: 0.9 mg/dL (ref 0.3–1.2)
Total Protein: 8 g/dL (ref 6.5–8.1)

## 2022-08-18 LAB — CREATININE, SERUM
Creatinine, Ser: 1.03 mg/dL (ref 0.61–1.24)
GFR, Estimated: >60 mL/min (ref >60)

## 2022-08-18 LAB — TROPONIN I (HIGH SENSITIVITY)
Troponin I (High Sensitivity): 11 ng/L (ref <18)
Troponin I (High Sensitivity): 11 ng/L (ref <18)

## 2022-08-18 LAB — RESP PANEL BY RT-PCR (RSV, FLU A&B, COVID)  RVPGX2
Influenza A by PCR: NEGATIVE
Influenza B by PCR: NEGATIVE
Resp Syncytial Virus by PCR: NEGATIVE
SARS Coronavirus 2 by RT PCR: NEGATIVE

## 2022-08-18 LAB — LACTIC ACID, PLASMA
Lactic Acid, Venous: 1.2 mmol/L (ref 0.5–1.9)
Lactic Acid, Venous: 1.8 mmol/L (ref 0.5–1.9)

## 2022-08-18 LAB — LIPASE, BLOOD: Lipase: 82 U/L — ABNORMAL HIGH (ref 11–51)

## 2022-08-18 LAB — HIV ANTIBODY (ROUTINE TESTING W REFLEX): HIV Screen 4th Generation wRfx: NONREACTIVE

## 2022-08-18 MED ORDER — ACETAMINOPHEN 325 MG PO TABS
650.0000 mg | ORAL_TABLET | Freq: Four times a day (QID) | ORAL | Status: DC | PRN
  Filled 2022-08-18: qty 2

## 2022-08-18 MED ORDER — LIDOCAINE VISCOUS HCL 2 % MT SOLN
15.0000 mL | Freq: Once | OROMUCOSAL | Status: AC
  Administered 2022-08-18: 15 mL via OROMUCOSAL
  Filled 2022-08-18: qty 15

## 2022-08-18 MED ORDER — HYDROMORPHONE HCL 1 MG/ML IJ SOLN
0.5000 mg | Freq: Once | INTRAMUSCULAR | Status: AC
  Administered 2022-08-18: 0.5 mg via INTRAVENOUS
  Filled 2022-08-18: qty 0.5

## 2022-08-18 MED ORDER — ONDANSETRON 4 MG PO TBDP: 4.0000 mg | ORAL_TABLET | Freq: Four times a day (QID) | ORAL | Status: DC | PRN

## 2022-08-18 MED ORDER — ONDANSETRON HCL 4 MG/2ML IJ SOLN: 4.0000 mg | Freq: Once | INTRAMUSCULAR | Status: DC

## 2022-08-18 MED ORDER — METOPROLOL TARTRATE 5 MG/5ML IV SOLN
5.0000 mg | Freq: Three times a day (TID) | INTRAVENOUS | Status: DC
  Administered 2022-08-18 – 2022-08-21 (×10): 5 mg via INTRAVENOUS
  Filled 2022-08-18 (×10): qty 5

## 2022-08-18 MED ORDER — MORPHINE SULFATE (PF) 2 MG/ML IV SOLN
2.0000 mg | INTRAVENOUS | Status: DC | PRN
  Administered 2022-08-18 – 2022-08-20 (×2): 2 mg via INTRAVENOUS
  Filled 2022-08-18 (×2): qty 1

## 2022-08-18 MED ORDER — SODIUM CHLORIDE 0.9 % IV SOLN: 12.5000 mg | Freq: Four times a day (QID) | INTRAVENOUS | Status: DC | PRN

## 2022-08-18 MED ORDER — SODIUM CHLORIDE 0.9 % IV BOLUS
1000.0000 mL | Freq: Once | INTRAVENOUS | Status: AC
  Administered 2022-08-18: 1000 mL via INTRAVENOUS

## 2022-08-18 MED ORDER — SODIUM CHLORIDE 0.9% FLUSH
3.0000 mL | Freq: Two times a day (BID) | INTRAVENOUS | Status: DC
  Administered 2022-08-18: 3 mL via INTRAVENOUS

## 2022-08-18 MED ORDER — SODIUM CHLORIDE 0.9 % IV SOLN
200.0000 mg | Freq: Once | INTRAVENOUS | Status: AC
  Administered 2022-08-18: 200 mg via INTRAVENOUS
  Filled 2022-08-18: qty 20

## 2022-08-18 MED ORDER — HYDRALAZINE HCL 20 MG/ML IJ SOLN: 10.0000 mg | Freq: Four times a day (QID) | INTRAMUSCULAR | Status: DC | PRN

## 2022-08-18 MED ORDER — ACETAMINOPHEN 325 MG PO TABS: 650.0000 mg | ORAL_TABLET | Freq: Four times a day (QID) | ORAL | Status: DC | PRN

## 2022-08-18 MED ORDER — LEVETIRACETAM IN NACL 1000 MG/100ML IV SOLN
1000.0000 mg | Freq: Two times a day (BID) | INTRAVENOUS | Status: DC
  Administered 2022-08-19 – 2022-08-21 (×5): 1000 mg via INTRAVENOUS
  Filled 2022-08-18 (×7): qty 100

## 2022-08-18 MED ORDER — SODIUM CHLORIDE 0.9 % IV SOLN
100.0000 mg | Freq: Two times a day (BID) | INTRAVENOUS | Status: DC
  Administered 2022-08-18 – 2022-08-21 (×6): 100 mg via INTRAVENOUS
  Filled 2022-08-18 (×7): qty 10

## 2022-08-18 MED ORDER — ENOXAPARIN SODIUM 80 MG/0.8ML IJ SOSY
0.5000 mg/kg | PREFILLED_SYRINGE | INTRAMUSCULAR | Status: DC
  Administered 2022-08-18: 70 mg via SUBCUTANEOUS
  Filled 2022-08-18: qty 0.7

## 2022-08-18 MED ORDER — LATANOPROST 0.005 % OP SOLN
1.0000 [drp] | Freq: Every day | OPHTHALMIC | Status: DC
  Filled 2022-08-18: qty 2.5

## 2022-08-18 MED ORDER — LEVETIRACETAM IN NACL 1000 MG/100ML IV SOLN
1000.0000 mg | Freq: Once | INTRAVENOUS | Status: AC
  Administered 2022-08-18: 1000 mg via INTRAVENOUS
  Filled 2022-08-18: qty 100

## 2022-08-18 MED ORDER — FLUTICASONE PROPIONATE 50 MCG/ACT NA SUSP
1.0000 | Freq: Every day | NASAL | Status: DC
  Administered 2022-08-18: 2 via NASAL
  Administered 2022-08-19: 1 via NASAL
  Administered 2022-08-21: 2 via NASAL
  Filled 2022-08-18: qty 16

## 2022-08-18 MED ORDER — HYDRALAZINE HCL 20 MG/ML IJ SOLN: 10.0000 mg | INTRAMUSCULAR | Status: DC | PRN

## 2022-08-18 MED ORDER — ACETAMINOPHEN 650 MG RE SUPP: 650.0000 mg | Freq: Four times a day (QID) | RECTAL | Status: DC | PRN

## 2022-08-18 MED ORDER — ONDANSETRON HCL 4 MG/2ML IJ SOLN
4.0000 mg | Freq: Four times a day (QID) | INTRAMUSCULAR | Status: DC | PRN
  Administered 2022-08-18 – 2022-08-19 (×3): 4 mg via INTRAVENOUS
  Filled 2022-08-18 (×3): qty 2

## 2022-08-18 MED ORDER — AZELASTINE HCL 0.1 % NA SOLN
1.0000 | Freq: Two times a day (BID) | NASAL | Status: DC
  Administered 2022-08-18: 2 via NASAL
  Administered 2022-08-19 (×2): 1 via NASAL
  Administered 2022-08-21: 2 via NASAL
  Filled 2022-08-18: qty 30

## 2022-08-18 MED ORDER — ONDANSETRON HCL 4 MG/2ML IJ SOLN
4.0000 mg | Freq: Four times a day (QID) | INTRAMUSCULAR | Status: DC | PRN
  Administered 2022-08-18: 4 mg via INTRAVENOUS
  Filled 2022-08-18: qty 2

## 2022-08-18 MED ORDER — KCL IN DEXTROSE-NACL 20-5-0.9 MEQ/L-%-% IV SOLN
INTRAVENOUS | Status: DC
  Filled 2022-08-18: qty 1000

## 2022-08-18 MED ORDER — KETOROLAC TROMETHAMINE 15 MG/ML IJ SOLN
15.0000 mg | Freq: Four times a day (QID) | INTRAMUSCULAR | Status: DC | PRN
  Administered 2022-08-18 – 2022-08-19 (×5): 15 mg via INTRAVENOUS
  Filled 2022-08-18 (×5): qty 1

## 2022-08-18 MED ORDER — LEVETIRACETAM IN NACL 1000 MG/100ML IV SOLN: 1000.0000 mg | Freq: Two times a day (BID) | INTRAVENOUS | Status: DC

## 2022-08-18 MED ORDER — LEVETIRACETAM IN NACL 500 MG/100ML IV SOLN
500.0000 mg | Freq: Once | INTRAVENOUS | Status: AC
  Administered 2022-08-18: 500 mg via INTRAVENOUS
  Filled 2022-08-18 (×2): qty 100

## 2022-08-18 MED ORDER — SODIUM CHLORIDE 0.9 % IV SOLN: 2500.0000 mg | Freq: Once | INTRAVENOUS | Status: DC

## 2022-08-18 MED ORDER — LACTATED RINGERS IV SOLN: INTRAVENOUS | Status: DC

## 2022-08-18 MED ORDER — IOHEXOL 350 MG/ML SOLN
100.0000 mL | Freq: Once | INTRAVENOUS | Status: AC | PRN
  Administered 2022-08-18: 125 mL via INTRAVENOUS

## 2022-08-18 MED ORDER — ONDANSETRON HCL 4 MG/2ML IJ SOLN
4.0000 mg | Freq: Once | INTRAMUSCULAR | Status: AC
  Administered 2022-08-18: 4 mg via INTRAVENOUS
  Filled 2022-08-18: qty 2

## 2022-08-18 MED ORDER — LORAZEPAM 2 MG/ML IJ SOLN
2.0000 mg | Freq: Once | INTRAMUSCULAR | Status: AC
  Administered 2022-08-18: 2 mg via INTRAVENOUS
  Filled 2022-08-18: qty 1

## 2022-08-18 MED ORDER — BENZOCAINE 20 % MT AERO
INHALATION_SPRAY | OROMUCOSAL | Status: DC | PRN
  Filled 2022-08-18 (×2): qty 57

## 2022-08-18 MED ORDER — PHENOL 1.4 % MT LIQD: 1.0000 | OROMUCOSAL | Status: DC | PRN

## 2022-08-18 MED ORDER — CLONIDINE HCL 0.2 MG/24HR TD PTWK
0.2000 mg | MEDICATED_PATCH | TRANSDERMAL | Status: DC
  Administered 2022-08-18: 0.2 mg via TRANSDERMAL
  Filled 2022-08-18: qty 1

## 2022-08-18 MED ORDER — LORAZEPAM 2 MG/ML IJ SOLN
0.5000 mg | Freq: Once | INTRAMUSCULAR | Status: AC
  Administered 2022-08-18: 0.5 mg via INTRAVENOUS
  Filled 2022-08-18: qty 1

## 2022-08-18 NOTE — TOC Initial Note (Signed)
Transition of Care [facility]) - Initial/Assessment Note    Patient Details  Name: NOBEL SHADE MRN: [id number] Date of Birth: [birth year]  Transition of Care [facility]) CM/SW Contact:    Beverly Sessions, RN Phone Number: 08/18/2022, 1:40 PM  Clinical Narrative:                  Transition of Care (TOC) Screening Note   Patient Details  Name: ZAN THISTLETHWAITE Date of Birth: 1961/02/13   Transition of Care [facility]) CM/SW Contact:    Beverly Sessions, RN Phone Number: 08/18/2022, 1:40 PM    Transition of Care Department (TOC) has reviewed patient and no TOC needs have been identified at this time. We will continue to monitor patient advancement through interdisciplinary progression rounds. If new patient transition needs arise, please place a TOC consult.  Acute O2. With plans to wean        Patient Goals and CMS Choice            Expected Discharge Plan and Services                                              Prior Living Arrangements/Services                       Activities of Daily Living Home Assistive Devices/Equipment: Eyeglasses, CPAP ADL Screening (condition at time of admission) Patient's cognitive ability adequate to safely complete daily activities?: Yes Is the patient deaf or have difficulty hearing?: No Does the patient have difficulty seeing, even when wearing glasses/contacts?: No Does the patient have difficulty concentrating, remembering, or making decisions?: No Patient able to express need for assistance with ADLs?: Yes Does the patient have difficulty dressing or bathing?: No Independently performs ADLs?: Yes (appropriate for developmental age) Does the patient have difficulty walking or climbing stairs?: No Weakness of Legs: None Weakness of Arms/Hands: None  Permission Sought/Granted                  Emotional Assessment              Admission diagnosis:  Small bowel obstruction ([place]) [K56.609] SBO (small  bowel obstruction) ([place]) [K56.609] Patient Active Problem List   Diagnosis Date Noted   Small bowel obstruction ([place]) 08/18/2022   Other fatigue 05/07/2022   Healthcare maintenance 05/07/2022   SBO (small bowel obstruction) ([place]) 05/28/2020   Recurrent major depressive disorder, in partial remission ([place]) 11/14/2019   Dysphagia    Colon cancer screening    Mild cognitive impairment 06/04/2019   History of arthroplasty of left shoulder 03/26/2019   History of total knee arthroplasty 03/21/2019   GERD (gastroesophageal reflux disease) 12/16/2018   Erectile dysfunction 09/14/2017   Open abdominal incision with drainage    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/11/2016   Necrotizing soft tissue infection 11/11/2016   Class 3 severe obesity due to excess calories with serious comorbidity and body mass index (BMI) of 45.0 to 49.9 in adult ([place]) 09/21/2016   Decreased libido 09/21/2016   Shortness of breath 06/15/2016   Abnormal stress test 06/15/2016   Testicular hypofunction 03/25/2015   Avitaminosis D 03/25/2015   Blood in stool    Clinical depression 06/21/2011   Cannot sleep 08/22/2009   Hypercholesterolemia without hypertriglyceridemia 01/28/2009   Hay fever 10/22/2008   Adjustment disorder with mixed anxiety and  depressed mood 12/12/2007   Essential (primary) hypertension 12/12/2007   Apnea, sleep 12/12/2007   PCP:  [name], MD [id number] [address] [id number]: [phone]: [phone]    Social Determinants of Health (SDOH) Social History: SDOH Screenings   Food Insecurity: No Food Insecurity (08/18/2022)  Housing: Low Risk  (08/18/2022)  Transportation Needs: No Transportation Needs (08/18/2022)  Utilities: Not At Risk (08/18/2022)  Alcohol Screen: Low Risk  (05/07/2022)  Depression (PHQ2-9): Medium Risk (05/07/2022)  Tobacco Use: Low Risk  (08/18/2022)   SDOH Interventions:      Readmission Risk Interventions     No data to display

## 2022-08-18 NOTE — ED Provider Notes (Addendum)
[facility] Provider Note    Event Date/Time   First MD Initiated Contact with Patient 08/18/22 [phone]     (approximate)   History   Abdominal Pain and Emesis   HPI  Peter [name] is a 62 y.o. male with history of appendicitis complicated by wound infection leading to sepsis, cholecystectomy, prior small bowel obstruction who comes in with abdominal pain.  On review of records patient had SBO back in 2021 that resolved with bowel rest and NG tube decompression.  Patient reports symptoms started last night around 9 PM.  He reports severe abdominal pain more in the lower abdomen.  Denies any chest pain, shortness of breath.  Denies any falls in his head.  He denies any urinary issues.   Physical Exam   Triage Vital Signs: ED Triage Vitals  Enc Vitals Group     BP 08/18/22 0133 (!) 144/86     Pulse Rate 08/18/22 0133 87     Resp 08/18/22 0133 (!) 22     Temp 08/18/22 0133 97.7 F (36.5 C)     Temp Source 08/18/22 0133 Oral     SpO2 08/18/22 0133 97 %     Weight 08/18/22 0136 (!) 305 lb (138.3 kg)     Height --      Head Circumference --      Peak Flow --      Pain Score 08/18/22 0135 8     Pain Loc --      Pain Edu? --      Excl. in [place]? --     Most recent vital signs: Vitals:   08/18/22 0133  BP: (!) 144/86  Pulse: 87  Resp: (!) 22  Temp: 97.7 F (36.5 C)  SpO2: 97%     General: Awake, no distress.  CV:  Good peripheral perfusion.  Resp:  Normal effort.  Abd:  No distention.  Other:     ED Results / Procedures / Treatments   Labs (all labs ordered are listed, but only abnormal results are displayed) Labs Reviewed  CBC WITH DIFFERENTIAL/PLATELET - Abnormal; Notable for the following components:      Result Value   WBC 17.0 (*)    Neutro Abs 13.9 (*)    Abs Immature Granulocytes 0.10 (*)    All other components within normal limits  COMPREHENSIVE METABOLIC PANEL - Abnormal; Notable for the following components:   Potassium  3.3 (*)    Glucose, Bld 142 (*)    BUN 41 (*)    All other components within normal limits  LIPASE, BLOOD - Abnormal; Notable for the following components:   Lipase 82 (*)    All other components within normal limits  URINALYSIS, ROUTINE W REFLEX MICROSCOPIC - Abnormal; Notable for the following components:   Color, Urine AMBER (*)    APPearance HAZY (*)    Protein, ur 30 (*)    Bacteria, UA RARE (*)    All other components within normal limits  RESP PANEL BY RT-PCR (RSV, FLU A&B, COVID)  [id number]  TROPONIN I (HIGH SENSITIVITY)     EKG  My interpretation of EKG:  Sinus rhythm 76 that any ST elevation, T wave version lead III and aVF and V3, normal intervals.  T wave version lead III prior  RADIOLOGY I have reviewed the CT personally interpreted and patient has a SBO  IMPRESSION: 1. Status post ventral hernia repair with probable mesh. Extensive associated dystrophic calcification. Mid small bowel obstruction  with a single point of transition immediately subjacent to the repaired anterior abdominal wall, likely related to adhesion. 2. Mild cardiomegaly. 3. Dystrophic calcification within the left adrenal gland in keeping with remote adrenal hemorrhage. 4. Mild prostatic hypertrophy.   PROCEDURES:  Critical Care performed: No  Procedures   MEDICATIONS ORDERED IN ED: Medications  LORazepam (ATIVAN) injection 0.5 mg (has no administration in time range)  lidocaine (XYLOCAINE) 2 % viscous mouth solution 15 mL (has no administration in time range)  ondansetron (ZOFRAN) injection 4 mg (4 mg Intravenous Given 08/18/22 0143)  HYDROmorphone (DILAUDID) injection 0.5 mg (0.5 mg Intravenous Given 08/18/22 0255)  iohexol (OMNIPAQUE) 350 MG/ML injection 100 mL (125 mLs Intravenous Contrast Given 08/18/22 0306)     IMPRESSION / MDM / ASSESSMENT AND PLAN / ED COURSE  I reviewed the triage vital signs and the nursing notes.   Patient's presentation is most consistent with acute  presentation with potential threat to life or bodily function.   Given some IV Dilaudid and IV Zofran for pain Patient comes in with lower abdominal pain and vomiting history of SBO's will get CT imaging to further evaluate for SBO, perforation, infection or other acute pathology.  COVID, flu are negative.  White count is elevated 17.  Lipase slightly elevated urine without evidence of UTI.  Patient meets sepsis criteria with elevated white count and respiratory rate will get blood cultures, lactate but patient is afebrile with no clear source CT imaging without any evidence of infection only signs of obstruction.  Therefore we will hold off on antibiotics at this time.  Discussed with Dr. Birdie Sons to keep patient here and less patient would prefer transfer to [facility] where patient had this hernia surgery done it.  Discussed with patient and he would prefer to stay here declines transfer to [facility].  Will trial NG tube.  Patient had difficulties with NG tube previously so we will trial some Ativan prior to placement.  Discussed with the hospitalist for admission.  Incidental findings given to family on CT  The patient is on the cardiac monitor to evaluate for evidence of arrhythmia and/or significant heart rate changes.      FINAL CLINICAL IMPRESSION(S) / ED DIAGNOSES   Final diagnoses:  SBO (small bowel obstruction) ([place])     Rx / DC Orders   ED Discharge Orders     None        Note:  This document was prepared using Dragon voice recognition software and may include unintentional dictation errors.   Vanessa [place], MD 08/18/22 [id number]    Vanessa Keeler, MD 08/18/22 [phone]

## 2022-08-18 NOTE — ED Triage Notes (Signed)
Pt presents to ER with c/o abd pain that started around 2100 yesterday and vomiting that started around 2hrs ago.  Pt  states abd pain is generalized and non-radiating at this time.  Pt denies diarrhea at this time.  Pt states he has had prior cholecystectomy.  Pt is otherwise A&O x4 at this time.  Pt appears uncomfortable in triage.

## 2022-08-18 NOTE — Consult Note (Signed)
Patient ID: Peter [name], male   DOB: [birth year], 62 y.o.   MRN: [id number]  HPI Peter [name] is a 62 y.o. male seen in consultationmale  for evaluation of abdominal pain. Patient reports the acute onset of abdominal pain in the upper portion of his abdomen last night . This was described to be a sharp and burning pain. He believes this onset just shortly after eating dinner last night. He noted associated nausea and  non-bloody and non-bilious emesis. No fever, chills, cough, CP, SOB, or urinary changes.  He does have a history of open abdomen with multiple laparotomies finally requiring closure with phasic's ST mesh at [facility].  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  Last bowel obstruction resolved with NG tube.  CT Chest/Abdomen/Pelvis was obtained and concerning for small bowel obstruction, there is heterotopic ossification throughout the abdominal wall.  There is evidence of a transition zone anterior to the abdominal wall.  There is no evidence of pneumatosis there is no evidence of free air White count was 14.2 with a hemoglobin of 14, CMP was normal except increase the BUN and potassium of 3.3 BMI of 46 HX bipolar, HTN, CHF  HPI  Past Medical History:  Diagnosis Date   Bipolar disorder ([place])    Blood in stool    Difficult intubation    Dyspnea    GERD (gastroesophageal reflux disease)    Glaucoma    since 2004   Hypertension    since 2000   Obesity, unspecified    Sleep apnea    Ulcer 2011   gastric   Unspecified hemorrhoids without mention of complication     Past Surgical History:  Procedure Laterality Date   APPLICATION OF WOUND VAC N/A 11/16/2016   Procedure: APPLICATION OF WOUND VAC-ABDOMINAL;  Surgeon: Florene Glen, MD;  Location: [facility];  Service: General;  Laterality: N/A;   APPLICATION OF WOUND VAC N/A 11/18/2016   Procedure: APPLICATION OF WOUND VAC;  Surgeon: Florene Glen, MD;  Location: [facility];   Service: General;  Laterality: N/A;  change   APPLICATION OF WOUND VAC N/A 11/23/2016   Procedure: WOUND VAC CHANGE;  Surgeon: Olean Ree, MD;  Location: [facility] OR[facility];  Service: General;  Laterality: N/A;  wound vac application   CARDIAC CATHETERIZATION Left 06/23/2016   Procedure: Left Heart Cath and Coronary Angiography;  Surgeon: Nelva Bush, MD;  Location: [place] CV LAB;  Service: Cardiovascular;  Laterality: Left;   CHOLECYSTECTOMY  1992   COLONOSCOPY W/ POLYPECTOMY  05/22/2010   20mtransverse colon polyp, traditional serrated adenoma,negative for high grade dysplasia & malignancy. rectal polyp-561mnegative for dysplasia & malignancy.   COLONOSCOPY WITH PROPOFOL N/A 06/20/2019   Procedure: COLONOSCOPY WITH PROPOFOL;  Surgeon: VaLin LandsmanMD;  [facility];  Service: Gastroenterology;  Laterality: N/A;   COLONOSCOPY WITH PROPOFOL N/A 06/21/2019   Procedure: COLONOSCOPY WITH PROPOFOL;  Surgeon: VaLin LandsmanMD;  [facility];  Service: Gastroenterology;  Laterality: N/A;   DRESSING CHANGE UNDER ANESTHESIA N/A 11/21/2016   Procedure: DRESSING CHANGE UNDER ANESTHESIA;  Surgeon: CoFlorene GlenMD;  Location: [facility];  Service: General;  Laterality: N/A;   ESOPHAGOGASTRODUODENOSCOPY (EGD) WITH PROPOFOL N/A 06/20/2019   Procedure: ESOPHAGOGASTRODUODENOSCOPY (EGD) WITH PROPOFOL;  Surgeon: VaLin LandsmanMD;  Location: [facility];  Service: Gastroenterology;  Laterality: N/A;   KNEE SURGERY Left 2010   LAPAROSCOPIC APPENDECTOMY N/A 10/30/2016   Procedure: APPENDECTOMY LAPAROSCOPIC changed  to open application of wound vac;  Surgeon: [name], MD;  Location: [facility];  Service: General;  Laterality: N/A;   LAPAROTOMY N/A 11/11/2016   Procedure: EXPLORATORY LAPAROTOMY, DEBRIDEMENT OF ABDOMINAL WOUND, ABDOMINAL [place];  Surgeon: [name], MD;  Location: [facility];  Service: General;  Laterality: N/A;   LAPAROTOMY N/A 11/12/2016    Procedure: EXPLORATORY LAPAROTOMY;  Surgeon: Clayburn Pert, MD;  Location: [facility];  Service: General;  Laterality: N/A;   LAPAROTOMY N/A 11/14/2016   Procedure: EXPLORATORY LAPAROTOMY; Irrigation, partial closure;  Surgeon: Clayburn Pert, MD;  Location: [facility];  Service: General;  Laterality: N/A;   TOTAL KNEE ARTHROPLASTY Left 03/21/2019   Procedure: TOTAL KNEE ARTHROPLASTY;  Surgeon: Lovell Sheehan, MD;  Location: [facility];  Service: Orthopedics;  Laterality: Left;    Family History  Problem Relation Age of Onset   Cancer Other        colon   Hyperlipidemia Mother    Hypertension Mother     Social History Social History   Tobacco Use   Smoking status: Never   Smokeless tobacco: Never  Vaping Use   Vaping Use: Never used  Substance Use Topics   Alcohol use: Not Currently    Comment: occasionally   Drug use: No    Allergies  Allergen Reactions   Oxycodone Other (See Comments)    Causes Hallucinations, auditory and tactile   Isosorbide Nausea And Vomiting    Headache, nausea, vomiting    Current Facility-Administered Medications  Medication Dose Route Frequency Provider Last Rate Last Admin   acetaminophen (TYLENOL) tablet 650 mg  650 mg Oral Q6H PRN Samella Parr, NP       Or   acetaminophen (TYLENOL) suppository 650 mg  650 mg Rectal Q6H PRN Samella Parr, NP       cloNIDine (CATAPRES - Dosed in mg/24 hr) patch 0.2 mg  0.2 mg Transdermal Weekly Samella Parr, NP       dextrose 5 % and 0.9 % NaCl with KCl 20 mEq/L infusion   Intravenous Continuous Samella Parr, NP 100 mL/hr at 08/18/22 0813 New Bag at 08/18/22 0813   enoxaparin (LOVENOX) injection 70 mg  0.5 mg/kg Subcutaneous Q24H Samella Parr, NP   70 mg at 08/18/22 0814   hydrALAZINE (APRESOLINE) injection 10 mg  10 mg Intravenous Q6H PRN Samella Parr, NP       ketorolac (TORADOL) 15 MG/ML injection 15 mg  15 mg Intravenous Q6H PRN Samella Parr, NP   15 mg at 08/18/22 0652    metoprolol tartrate (LOPRESSOR) injection 5 mg  5 mg Intravenous Q8H Samella Parr, NP   5 mg at 08/18/22 0652   ondansetron (ZOFRAN) injection 4 mg  4 mg Intravenous Q6H PRN Samella Parr, NP       promethazine (PHENERGAN) 12.5 mg in sodium chloride 0.9 % 50 mL IVPB  12.5 mg Intravenous Q6H PRN Samella Parr, NP       sodium chloride flush (NS) 0.9 % injection 3 mL  3 mL Intravenous Q12H Samella Parr, NP       Current Outpatient Medications  Medication Sig Dispense Refill   ALPRAZolam (XANAX) 0.5 MG tablet TAKE 1 TABLET(0.5 MG) BY MOUTH TWICE DAILY AS NEEDED FOR ANXIETY (Patient not taking: Reported on 05/07/2022) 60 tablet 1   amLODipine (NORVASC) 10 MG tablet Take 1 tablet (10 mg total) by mouth daily. 90 tablet 1   aspirin  EC 81 MG tablet Take 81 mg by mouth daily.     atorvastatin (LIPITOR) 40 MG tablet Take 1 tablet (40 mg total) by mouth daily. 90 tablet 2   b complex vitamins capsule Take 1 capsule by mouth daily. 30 capsule 11   carbamazepine (TEGRETOL) 200 MG tablet TAKE THREE TABLETS BY MOUTH EVERY MORNING AND TAKE TWO TABLETS BY MOUTH AT BEDTIME 450 tablet 0   chlorthalidone (HYGROTON) 25 MG tablet Take 1 tablet (25 mg total) by mouth daily. 90 tablet 1   Cholecalciferol (NAT-RUL VITAMIN D) 25 MCG (1000 UT) tablet Take 4 tablets (4,000 Units total) by mouth daily. 120 tablet 0   cloNIDine (CATAPRES) 0.2 MG tablet Take 1 tablet (0.2 mg total) by mouth 2 (two) times daily. 99991111 tablet 1   GARLIC PO Take by mouth. Kyolic [id number]     hydrALAZINE (APRESOLINE) 100 MG tablet TAKE ONE TABLET BY MOUTH THREE TIMES A DAY 270 tablet 1   lamoTRIgine (LAMICTAL) 200 MG tablet TAKE 1 TABLET BY MOUTH TWICE A DAY 60 tablet 0   latanoprost (XALATAN) 0.005 % ophthalmic solution Place 1 drop into both eyes at bedtime.     losartan (COZAAR) 100 MG tablet Take 1 tablet (100 mg total) by mouth daily. 90 tablet 1   meloxicam (MOBIC) 15 MG tablet Take 15 mg by mouth daily.     metoprolol tartrate  (LOPRESSOR) 50 MG tablet Take 1 tablet (50 mg total) by mouth 2 (two) times daily. 180 tablet 1   NARCAN 4 MG/0.1ML LIQD nasal spray kit USE 1 SPRAY IEN UTD PRN (Patient not taking: Reported on 02/23/2021)     NON FORMULARY CPAP     Omega-3 Fatty Acids (FISH OIL) 1000 MG CAPS Take 1 capsule by mouth daily.      omeprazole (PRILOSEC) 40 MG capsule Take 1 capsule (40 mg total) by mouth daily before breakfast. (Patient not taking: Reported on 05/21/2022) 30 capsule 3   potassium chloride (KLOR-CON) 10 MEQ tablet Take 10 mEq by mouth daily.     sertraline (ZOLOFT) 100 MG tablet Take 1 tablet (100 mg total) by mouth daily. 90 tablet 1   vitamin B-12 (CYANOCOBALAMIN) 1000 MCG tablet Take 1,000 mcg by mouth daily.        Review of Systems Full ROS  was asked and was negative except for the information on the HPI  Physical Exam Blood pressure (!) 170/84, pulse 85, temperature 97.8 F (36.6 C), resp. rate 20, weight (!) 138.3 kg, SpO2 96 %. CONSTITUTIONAL: bmi 30, IN  DISCOMFORT. EYES: Pupils are equal, round,  Sclera are non-icteric. EARS, NOSE, MOUTH AND THROAT: The oropharynx is clear. The oral mucosa is pink and moist. Hearing is intact to voice. LYMPH NODES:  Lymph nodes in the neck are normal. RESPIRATORY:  Lungs are clear. There is normal respiratory effort, with equal breath sounds bilaterally, and without pathologic use of accessory muscles. CARDIOVASCULAR: Heart is regular without murmurs, gallops, or rubs. GI: The abdomen is  soft, mild tenderness to palpation diffusely.  No open wounds.  No obvious incarcerated hernias.  No peritonitis  Multiple laparotomy scars there is no hepatosplenomegaly.  GU: Rectal deferred.   MUSCULOSKELETAL: Normal muscle strength and tone. No cyanosis or edema.   SKIN: Turgor is good and there are no pathologic skin lesions or ulcers. NEUROLOGIC: Motor and sensation is grossly normal. Cranial nerves are grossly intact. PSYCH:  Oriented to person, place and  time. Affect is normal.  Data Reviewed  I have personally reviewed the patient's imaging, laboratory findings and medical records.    Assessment/Plan  62 yo male w small bowel obstruction on the patient with multiple comorbidities and very hostile abdomen is with more than 8 laparotomies in the past.  At this time he is not toxic definitely recommend medical management with NG tube IV fluids correction of electrolytes.  Will perform serial abdominal exams and serial x-rays.  Discussed with him in detail that he will be his best interest to manage him conservatively as his abdomen is very hostile and going back would be very dangerous.  Will continue to follow They understand and agree.  Please note that I spent 75 minutes's encounter including personally reviewing imaging studies, coordinating his care, placing orders and performing appropriate documentation   Caroleen Hamman, MD FACS General Surgeon 08/18/2022, 8:20 AM

## 2022-08-18 NOTE — ED Notes (Signed)
Attempted to place NG tube twice with Yetta Flock, RN. Patient refused anymore attempts.

## 2022-08-18 NOTE — ED Notes (Signed)
PA Olean Ree at bedside to attempt to place NG tube.

## 2022-08-18 NOTE — ED Notes (Signed)
Per PA Olean Ree keep patient on continuous suction of NG tube for now.

## 2022-08-18 NOTE — ED Notes (Signed)
Assumed care from Emily, RN. Pt resting comfortably in bed at this time. Pt denies any current needs or questions. Call light with in reach.   

## 2022-08-18 NOTE — Progress Notes (Signed)
Anticoagulation monitoring(Lovenox):  62 yo male ordered Lovenox 40 mg Q24h    Filed Weights   08/18/22 0136  Weight: (!) 138.3 kg (305 lb)   BMI 47.8    Lab Results  Component Value Date   CREATININE 1.05 08/18/2022   CREATININE 1.05 05/31/2022   CREATININE 0.96 10/22/2021   Estimated Creatinine Clearance: 95.5 mL/min (by C-G formula based on SCr of 1.05 mg/dL). Hemoglobin & Hematocrit     Component Value Date/Time   HGB 14.9 08/18/2022 0144   HGB 14.1 05/31/2022 0743   HCT 43.7 08/18/2022 0144   HCT 41.8 05/31/2022 0743     Per Protocol for Patient with estCrcl > 30 ml/min and BMI > 30, will transition to Lovenox 70 mg Q24h.

## 2022-08-18 NOTE — ED Notes (Signed)
Pt gown and linens changed. Pt given new emesis bag.

## 2022-08-18 NOTE — H&P (Signed)
History and Physical    Patient: Peter [name] [id number] DOB: April 22, 1961 DOA: 08/18/2022 DOS: the patient was seen and examined on 08/18/2022 PCP: [name], MD  Patient coming from: Home - lives with wife; Donald Prose: Wife, [phone]   Chief Complaint: Abdominal pain  HPI: Peter [name] is a 62 y.o. male with medical history significant of bipolar d/o, GERD/ulcer, glaucoma, HTN, OSA, remote SBO, and morbid obesity presenting with abdominal pain. Abdominal pain started last night about 9pm.  He had bloating, then pain, then vomiting.  He has had a bowel obstruction in the past and this was similar.  Last BM was Monday-Tuesday of this week.  He had an NG tube in the past.  They tried but were unable to place in the ER.     ER Course:  Carryover, per Dr. Sidney Ace:  Status post ventral hernia repair with probable mesh. Extensive associated dystrophic calcification. Mid small bowel obstruction with a single point of transition immediately subjacent to the repaired anterior abdominal wall, likely related to adhesion. pt with abdominal pain/vomiting white count 17 but afebrile. h/o SBO in 2021 seen by you- better with bowel rest and NG -- was going to try this again.  Dr. Dahlia Byes is aware.      Review of Systems: As mentioned in the history of present illness. All other systems reviewed and are negative. Past Medical History:  Diagnosis Date   Bipolar disorder ([place])    Blood in stool    Difficult intubation    Dyspnea    GERD (gastroesophageal reflux disease)    Glaucoma    since 2004   Hypertension    since 2000   Obesity, unspecified    Sleep apnea    Ulcer 2011   gastric   Unspecified hemorrhoids without mention of complication    Past Surgical History:  Procedure Laterality Date   APPLICATION OF WOUND VAC N/A 11/16/2016   Procedure: APPLICATION OF WOUND VAC-ABDOMINAL;  Surgeon: Florene Glen, MD;  Location: [facility];  Service: General;  Laterality: N/A;    APPLICATION OF WOUND VAC N/A 11/18/2016   Procedure: APPLICATION OF WOUND VAC;  Surgeon: Florene Glen, MD;  Location: [facility];  Service: General;  Laterality: N/A;  change   APPLICATION OF WOUND VAC N/A 11/23/2016   Procedure: WOUND VAC CHANGE;  Surgeon: Olean Ree, MD;  Location: [facility];  Service: General;  Laterality: N/A;  wound vac application   CARDIAC CATHETERIZATION Left 06/23/2016   Procedure: Left Heart Cath and Coronary Angiography;  Surgeon: Nelva Bush, MD;  Location: [facility];  Service: Cardiovascular;  Laterality: Left;   CHOLECYSTECTOMY  1992   COLONOSCOPY W/ POLYPECTOMY  05/22/2010   63mtransverse colon polyp, traditional serrated adenoma,negative for high grade dysplasia & malignancy. rectal polyp-548mnegative for dysplasia & malignancy.   COLONOSCOPY WITH PROPOFOL N/A 06/20/2019   Procedure: COLONOSCOPY WITH PROPOFOL;  Surgeon: VaLin LandsmanMD;  [facility];  Service: Gastroenterology;  Laterality: N/A;   COLONOSCOPY WITH PROPOFOL N/A 06/21/2019   Procedure: COLONOSCOPY WITH PROPOFOL;  Surgeon: VaLin LandsmanMD;  [facility];  Service: Gastroenterology;  Laterality: N/A;   DRESSING CHANGE UNDER ANESTHESIA N/A 11/21/2016   Procedure: DRESSING CHANGE UNDER ANESTHESIA;  Surgeon: CoFlorene GlenMD;  Location: [facility];  Service: General;  Laterality: N/A;   ESOPHAGOGASTRODUODENOSCOPY (EGD) WITH PROPOFOL N/A 06/20/2019   Procedure: ESOPHAGOGASTRODUODENOSCOPY (EGD) WITH PROPOFOL;  Surgeon: VaLin LandsmanMD;  Location: [facility];  Service: Gastroenterology;  Laterality: N/A;   KNEE SURGERY Left 2010   LAPAROSCOPIC APPENDECTOMY N/A 10/30/2016   Procedure: APPENDECTOMY LAPAROSCOPIC changed  to open application of wound vac;  Surgeon: [name], MD;  Location: [facility];  Service: General;  Laterality: N/A;   LAPAROTOMY N/A 11/11/2016   Procedure: EXPLORATORY LAPAROTOMY, DEBRIDEMENT OF ABDOMINAL WOUND,  ABDOMINAL [place];  Surgeon: [name], MD;  Location: [facility];  Service: General;  Laterality: N/A;   LAPAROTOMY N/A 11/12/2016   Procedure: EXPLORATORY LAPAROTOMY;  Surgeon: Clayburn Pert, MD;  Location: [facility];  Service: General;  Laterality: N/A;   LAPAROTOMY N/A 11/14/2016   Procedure: EXPLORATORY LAPAROTOMY; Irrigation, partial closure;  Surgeon: Clayburn Pert, MD;  Location: [facility];  Service: General;  Laterality: N/A;   TOTAL KNEE ARTHROPLASTY Left 03/21/2019   Procedure: TOTAL KNEE ARTHROPLASTY;  Surgeon: Lovell Sheehan, MD;  Location: [facility];  Service: Orthopedics;  Laterality: Left;   Social History:  reports that he has never smoked. He has never used smokeless tobacco. He reports that he does not currently use alcohol. He reports that he does not use drugs.  Allergies  Allergen Reactions   Oxycodone Other (See Comments)    Causes Hallucinations, auditory and tactile   Isosorbide Nausea And Vomiting    Headache, nausea, vomiting    Family History  Problem Relation Age of Onset   Cancer Other        colon   Hyperlipidemia Mother    Hypertension Mother     Prior to Admission medications   Medication Sig Start Date End Date Taking? Authorizing Provider  ALPRAZolam (XANAX) 0.5 MG tablet TAKE 1 TABLET(0.5 MG) BY MOUTH TWICE DAILY AS NEEDED FOR ANXIETY Patient not taking: Reported on 05/07/2022 11/21/20   Donnal Moat T, PA-C  amLODipine (NORVASC) 10 MG tablet Take 1 tablet (10 mg total) by mouth daily. 07/22/22   Simmons-Robinson, Riki Sheer, MD  aspirin EC 81 MG tablet Take 81 mg by mouth daily.    [provider]  atorvastatin (LIPITOR) 40 MG tablet Take 1 tablet (40 mg total) by mouth daily. 07/22/22   Simmons-Robinson, Riki Sheer, MD  b complex vitamins capsule Take 1 capsule by mouth daily. 07/08/20   Donnal Moat T, PA-C  carbamazepine (TEGRETOL) 200 MG tablet TAKE THREE TABLETS BY MOUTH EVERY MORNING AND TAKE TWO TABLETS BY MOUTH AT BEDTIME 07/22/22    Donnal Moat T, PA-C  chlorthalidone (HYGROTON) 25 MG tablet Take 1 tablet (25 mg total) by mouth daily. 07/30/22   Simmons-Robinson, Riki Sheer, MD  Cholecalciferol (NAT-RUL VITAMIN D) 25 MCG (1000 UT) tablet Take 4 tablets (4,000 Units total) by mouth daily. 07/23/22   Simmons-Robinson, Makiera, MD  cloNIDine (CATAPRES) 0.2 MG tablet Take 1 tablet (0.2 mg total) by mouth 2 (two) times daily. 05/24/22   Simmons-Robinson, Riki Sheer, MD  GARLIC PO Take by mouth. Kyolic [id number]    [provider]  hydrALAZINE (APRESOLINE) 100 MG tablet TAKE ONE TABLET BY MOUTH THREE TIMES A DAY 04/26/22   Mikey Kirschner, PA-C  lamoTRIgine (LAMICTAL) 200 MG tablet TAKE 1 TABLET BY MOUTH TWICE A DAY 08/15/22   Hurst, Teresa T, PA-C  latanoprost (XALATAN) 0.005 % ophthalmic solution Place 1 drop into both eyes at bedtime.    [provider]  losartan (COZAAR) 100 MG tablet Take 1 tablet (100 mg total) by mouth daily. 06/03/22   Simmons-Robinson, Makiera, MD  meloxicam (MOBIC) 15 MG tablet Take 15 mg by mouth daily. 12/22/21   [provider]  metoprolol tartrate (LOPRESSOR) 50 MG tablet Take 1 tablet (50 mg total) by mouth 2 (two) times daily. 07/22/22   Simmons-Robinson, Riki Sheer, MD  NARCAN 4 MG/0.1ML LIQD nasal spray kit USE 1 SPRAY IEN UTD PRN Patient not taking: Reported on 02/23/2021 04/05/19   [provider]  NON FORMULARY CPAP    [provider]  Omega-3 Fatty Acids (FISH OIL) 1000 MG CAPS Take 1 capsule by mouth daily.     [provider]  omeprazole (PRILOSEC) 40 MG capsule Take 1 capsule (40 mg total) by mouth daily before breakfast. Patient not taking: Reported on 05/21/2022 12/04/19   Lin Landsman, MD  potassium chloride (KLOR-CON) 10 MEQ tablet Take 10 mEq by mouth daily.    [provider]  sertraline (ZOLOFT) 100 MG tablet Take 1 tablet (100 mg total) by mouth daily. 05/24/22   Simmons-Robinson, Makiera, MD  vitamin B-12 (CYANOCOBALAMIN) 1000 MCG  tablet Take 1,000 mcg by mouth daily.  07/17/19   [provider]    Physical Exam: Vitals:   08/18/22 1100 08/18/22 1200 08/18/22 1335 08/18/22 1550  BP: (!) 151/88 (!) 163/87 (!) 148/95 (!) 161/90  Pulse: 100 99 (!) 107 (!) 102  Resp:  20 18 20  $ Temp:  98 F (36.7 C) 98.2 F (36.8 C) 98.9 F (37.2 C)  TempSrc:   Oral Oral  SpO2: 94% 91% 92% 97%  Weight:   135 kg   Height:   5' 7"$  (1.702 m)    General:  Appears miserable, periodically violently retching Eyes:  EOMI, normal lids, iris ENT:  grossly normal hearing, lips & tongue, mmm Neck:  no LAD, masses or thyromegaly Cardiovascular:  RRR, no m/r/g. No LE edema.  Respiratory:   CTA bilaterally with no wheezes/rales/rhonchi.  Normal respiratory effort. Abdomen:  voluntary/involuntary guarding, diffuse TTP Skin:  no rash or induration seen on limited exam Musculoskeletal:  grossly normal tone BUE/BLE, good ROM, no bony abnormality Psychiatric:  blunted mood and affect, speech fluent and appropriate, AOx3 Neurologic:  CN 2-12 grossly intact, moves all extremities in coordinated fashion   Radiological Exams on Admission: Independently reviewed - see discussion in A/P where applicable  DG Abdomen 1 View  Result Date: 08/18/2022 CLINICAL DATA:  NG tube placement EXAM: ABDOMEN - 1 VIEW COMPARISON:  None Available. FINDINGS: NG tube with tip in the stomach. Side port essentially at the GE junction. IMPRESSION: NG tube in stomach with side port at GE junction. Electronically Signed   By: Suzy Bouchard M.D.   On: 08/18/2022 11:59   CT ABDOMEN PELVIS W CONTRAST  Result Date: 08/18/2022 CLINICAL DATA:  Abdominal pain, acute, nonlocalized EXAM: CT ABDOMEN AND PELVIS WITH CONTRAST TECHNIQUE: Multidetector CT imaging of the abdomen and pelvis was performed using the standard protocol following bolus administration of intravenous contrast. RADIATION DOSE REDUCTION: This exam was performed according to the departmental  dose-optimization program which includes automated exposure control, adjustment of the mA and/or kV according to patient size and/or use of iterative reconstruction technique. CONTRAST:  189m OMNIPAQUE IOHEXOL 350 MG/ML SOLN COMPARISON:  05/28/2020 FINDINGS: Lower chest: No acute abnormality.  Mild cardiomegaly. Hepatobiliary: No focal liver abnormality is seen. Status post cholecystectomy. No biliary dilatation. Pancreas: Unremarkable Spleen: Unremarkable Adrenals/Urinary Tract: Right adrenal gland is unremarkable. Dystrophic calcification within the left adrenal gland in keeping with remote adrenal hemorrhage. Left adrenal gland is otherwise unremarkable. Kidneys are normal. Bladder is unremarkable. Stomach/Bowel: There is extensive dystrophic calcification involving the ventral abdominal wall likely related  to ventral hernia repair with mesh. A mid small bowel obstruction is present with a single point of transition immediately subjacent to the repaired anterior abdominal wall, best seen on axial image # 62/2 and sagittal image # 82/6. Distally, the small bowel is decompressed. The stomach and large bowel are unremarkable. Appendix absent. No free intraperitoneal gas or fluid. Vascular/Lymphatic: No significant vascular findings are present. No enlarged abdominal or pelvic lymph nodes. Reproductive: Mild prostatic hypertrophy Other: No recurrent abdominal wall hernia Musculoskeletal: No acute bone abnormality. No lytic or blastic bone lesion. Degenerative changes are seen within the lumbar spine and left hip with extensive heterotopic ossifications seen posterior to the left hip. IMPRESSION: 1. Status post ventral hernia repair with probable mesh. Extensive associated dystrophic calcification. Mid small bowel obstruction with a single point of transition immediately subjacent to the repaired anterior abdominal wall, likely related to adhesion. 2. Mild cardiomegaly. 3. Dystrophic calcification within the left  adrenal gland in keeping with remote adrenal hemorrhage. 4. Mild prostatic hypertrophy. Electronically Signed   By: Fidela Salisbury M.D.   On: 08/18/2022 03:32    EKG: Independently reviewed.  NSR with rate 76; nonspecific ST changes with no evidence of acute ischemia   Labs on Admission: I have personally reviewed the available labs and imaging studies at the time of the admission.  Pertinent labs:    K+ 3.3 Glucose 142 BUN 41 AST 82 HS troponin 11, 11 Lactate 1.2, 1.8 WBC 17 -> 14.2 COVID/flu/RSV negative UA: 30 protein Blood cultures pending   Assessment and Plan: Principal Problem:   SBO (small bowel obstruction) (HCC) Active Problems:   Essential (primary) hypertension   Hypercholesterolemia without hypertriglyceridemia   Class 3 severe obesity due to excess calories with serious comorbidity and body mass index (BMI) of 45.0 to 49.9 in adult [facility])   Bipolar disorder with depression ([place])   Glaucoma (increased eye pressure)   DNR (do not resuscitate)    SBO -Patient with prior h/o abdominal surgeries presenting with acute onset of abdominal pain with n/v, abdominal distention, and CT findings c/w SBO -Will admit to Med Surg -Gen Surg consulted by ER and will see; currently no indication for surgical intervention -80% of SBO will resolve without surgery -High-grade SBO can usually be safely managed non-operatively -If PO contrast reaches colon within 24 hours, SBO will very likely certainly resolve without surgery -NPO for bowel rest -NG tube ordered and now in place -IVF hydration -Pain control with morphine -Current guidelines recommend that patients without resolution undergo surgery by 3-5 days  Bipolar d/o -He normally takes sertraline, carbamazepine, lamotrigine -Neurology was asked to assist with transition to IV medications for now since he is strictly NPO  HTN -Unable to tolerate PO meds for now - hold amlodipine, chlorthalidone, losartan, clonidine PO,  hydralazine, metoprolol -He was given Clonidine TTS, standing IV metoprolol -Will add prn IV hydralazine  HLD -Hold atorvastatin for now  Glaucoma -Continue latanoprost  Morbid obesity -Body mass index is 46.61 kg/m..  -Weight loss should be encouraged -Outpatient PCP/bariatric medicine/bariatric surgery f/u encouraged   OSA -CPAP ordered, but patient is unlikely to be able to wear it with NG tube in place  DNR -I have discussed code status with the patient and he would not desire resuscitation and would prefer to die a natural death should that situation arise. -He will need a gold out of facility DNR form at the time of discharge      Advance Care Planning:   Code  Status: DNR   Consults: Surgery  DVT Prophylaxis: SCDs  Family Communication: Wife was present periodically during the evaluation  Severity of Illness: The appropriate patient status for this patient is INPATIENT. Inpatient status is judged to be reasonable and necessary in order to provide the required intensity of service to ensure the patient's safety. The patient's presenting symptoms, physical exam findings, and initial radiographic and laboratory data in the context of their chronic comorbidities is felt to place them at high risk for further clinical deterioration. Furthermore, it is not anticipated that the patient will be medically stable for discharge from the hospital within 2 midnights of admission.   * I certify that at the point of admission it is my clinical judgment that the patient will require inpatient hospital care spanning beyond 2 midnights from the point of admission due to high intensity of service, high risk for further deterioration and high frequency of surveillance required.*  Author: Karmen Bongo, MD 08/18/2022 3:53 PM  For on call review [email or web address].

## 2022-08-18 NOTE — Progress Notes (Signed)
Pt ordered CPAP for QHS, pt has NG tube at this time and refusing a machine. Pt notified when NG removed to let RN know to notify RT to bring a CPAP machine to room, pt wears a CPAP at home but will not bring home unit but will have someone bring his home mask and circuit to use on our machine.

## 2022-08-18 NOTE — ED Notes (Signed)
Per PA Olean Ree change pt to low intermittent suction. Pt at 80 on low intermittent suction at this time. Pt tolerating well.

## 2022-08-19 ENCOUNTER — Inpatient Hospital Stay: Payer: 59

## 2022-08-19 DIAGNOSIS — K56609 Unspecified intestinal obstruction, unspecified as to partial versus complete obstruction: Secondary | ICD-10-CM | POA: Diagnosis not present

## 2022-08-19 LAB — CBC
HCT: 40.2 % (ref 39.0–52.0)
Hemoglobin: 13.2 g/dL (ref 13.0–17.0)
MCH: 29.6 pg (ref 26.0–34.0)
MCHC: 32.8 g/dL (ref 30.0–36.0)
MCV: 90.1 fL (ref 80.0–100.0)
Platelets: 171 10*3/uL (ref 150–400)
RBC: 4.46 MIL/uL (ref 4.22–5.81)
RDW: 13.2 % (ref 11.5–15.5)
WBC: 7.6 10*3/uL (ref 4.0–10.5)
nRBC: 0 % (ref 0.0–0.2)

## 2022-08-19 LAB — COMPREHENSIVE METABOLIC PANEL
ALT: 21 U/L (ref 0–44)
AST: 22 U/L (ref 15–41)
Albumin: 2.9 g/dL — ABNORMAL LOW (ref 3.5–5.0)
Alkaline Phosphatase: 74 U/L (ref 38–126)
Anion gap: 6 (ref 5–15)
BUN: 43 mg/dL — ABNORMAL HIGH (ref 8–23)
CO2: 30 mmol/L (ref 22–32)
Calcium: 8.1 mg/dL — ABNORMAL LOW (ref 8.9–10.3)
Chloride: 104 mmol/L (ref 98–111)
Creatinine, Ser: 0.98 mg/dL (ref 0.61–1.24)
GFR, Estimated: >60 mL/min (ref >60)
Glucose, Bld: 96 mg/dL (ref 70–99)
Potassium: 3.1 mmol/L — ABNORMAL LOW (ref 3.5–5.1)
Sodium: 140 mmol/L (ref 135–145)
Total Bilirubin: 1.1 mg/dL (ref 0.3–1.2)
Total Protein: 6.3 g/dL — ABNORMAL LOW (ref 6.5–8.1)

## 2022-08-19 LAB — PHOSPHORUS: Phosphorus: 3.4 mg/dL (ref 2.5–4.6)

## 2022-08-19 LAB — MAGNESIUM: Magnesium: 2.4 mg/dL (ref 1.7–2.4)

## 2022-08-19 MED ORDER — POTASSIUM CHLORIDE 10 MEQ/100ML IV SOLN
10.0000 meq | INTRAVENOUS | Status: AC
  Administered 2022-08-19 (×6): 10 meq via INTRAVENOUS
  Filled 2022-08-19 (×4): qty 100

## 2022-08-19 NOTE — Plan of Care (Signed)

## 2022-08-19 NOTE — Progress Notes (Signed)
[facility] Progress Note  Patient: Peter [name]    [id number]  DOA: 08/18/2022     Date of Service: the patient was seen and examined on 08/19/2022  Chief Complaint  Patient presents with   Abdominal Pain   Emesis   Brief hospital course: AQUILLA GODBEE is a 62 y.o. male with PMH of HTN, bipolar d/o, GERD/ulcer, glaucoma, OSA, remote SBO, open abdomen with multiple laparotomies finally requiring closure with phasic's ST mesh at [facility].  He did develop necrosis of the abdominal wall, and morbid obesity presenting with abdominal pain.  ED workup, CT scan showed  Status post ventral hernia repair with probable mesh. Extensive associated dystrophic calcification. Mid small bowel obstruction with a single point of transition immediately subjacent to the repaired anterior abdominal wall, likely related to adhesion. General surgery was consulted, recommended conservative management and patient was admitted for further management as below.   Assessment and Plan: Principal Problem:   SBO (small bowel obstruction) (HCC) Active Problems:   Essential (primary) hypertension   Hypercholesterolemia without hypertriglyceridemia   Class 3 severe obesity due to excess calories with serious comorbidity and body mass index (BMI) of 45.0 to 49.9 in adult [facility])   Bipolar disorder with depression ([place])   Glaucoma (increased eye pressure)   DNR (do not resuscitate)     SBO -Patient with prior h/o abdominal surgeries presenting with acute onset of abdominal pain with n/v, abdominal distention, and CT findings c/w SBO -Will admit to Med Surg -Gen Surg consulted by ER and will see; currently no indication for surgical intervention -80% of SBO will resolve without surgery -High-grade SBO can usually be safely managed non-operatively -If PO contrast reaches colon within 24 hours, SBO will very likely certainly resolve without surgery -NPO for bowel rest -NG tube ordered and now in place -IVF  hydration -Pain control with morphine -Current guidelines recommend that patients without resolution undergo surgery by 3-5 days   Bipolar d/o -He normally takes sertraline, carbamazepine, lamotrigine -Neurology was asked to assist with transition to IV medications for now since he is strictly NPO   HTN -Unable to tolerate PO meds for now - hold amlodipine, chlorthalidone, losartan, clonidine PO, hydralazine, metoprolol -He was given Clonidine TTS, standing IV metoprolol -Will add prn IV hydralazine   HLD -Hold atorvastatin for now   Glaucoma -Continue latanoprost   Morbid obesity -Body mass index is 46.61 kg/m..  -Weight loss should be encouraged -Outpatient PCP/bariatric medicine/bariatric surgery f/u encouraged    OSA -CPAP ordered, but patient is unlikely to be able to wear it with NG tube in place   DNR -I have discussed code status with the patient and he would not desire resuscitation and would prefer to die a natural death should that situation arise. -He will need a gold out of facility DNR form at the time of discharge      Body mass index is 46.61 kg/m.  Interventions:       Diet: NPO DVT Prophylaxis: SCDs  Advance goals of care discussion: DNR  Family Communication: family was present at bedside, at the time of interview.  The pt provided permission to discuss medical plan with the family. Opportunity was given to ask question and all questions were answered satisfactorily.   Disposition:  Pt is from Home, admitted with SBO, still has SBO, NPO, and NG tube, which precludes a safe discharge. Discharge to Home, when clinically stable and cleared by general surgery.  Subjective: No significant events overnight, patient  denies abdominal pain but is still feels little bit pressure, patient is not moving bowels and not passing gas  NG tube is intact, denies any nausea vomiting, no chest pain or palpitations, no shortness of breath.  Physical  Exam: General: NAD, lying comfortably Appear in no distress, affect appropriate Eyes: PERRLA ENT: Oral Mucosa Clear, moist  Neck: no JVD,  Cardiovascular: S1 and S2 Present, no Murmur,  Respiratory: good respiratory effort, Bilateral Air entry equal and Decreased, no Crackles, no wheezes Abdomen: Bowel Sound present, Soft and no tenderness, NG tube intact  Skin: no rashes Extremities: no Pedal edema, no calf tenderness Neurologic: without any new focal findings Gait not checked due to patient safety concerns  Vitals:   08/18/22 1550 08/18/22 1931 08/19/22 0517 08/19/22 0802  BP: (!) 161/90 (!) 154/83 (!) 147/74 137/78  Pulse: (!) 102 (!) 110 77 74  Resp: 20 15 14 18  $ Temp: 98.9 F (37.2 C) 99.2 F (37.3 C) 97.7 F (36.5 C) 98.3 F (36.8 C)  TempSrc: Oral Oral Oral   SpO2: 97% 93% 90% 91%  Weight:      Height:        Intake/Output Summary (Last 24 hours) at 08/19/2022 1317 Last data filed at 08/19/2022 0406 Gross per 24 hour  Intake 925.31 ml  Output 225 ml  Net 700.31 ml   Filed Weights   08/18/22 0136 08/18/22 0822 08/18/22 1335  Weight: (!) 138.3 kg (!) 138.3 kg 135 kg    Data Reviewed: I have personally reviewed and interpreted daily labs, tele strips, imagings as discussed above. I reviewed all nursing notes, pharmacy notes, vitals, pertinent old records I have discussed plan of care as described above with RN and patient/family.  CBC: Recent Labs  Lab 08/18/22 0144 08/18/22 0652 08/19/22 0505  WBC 17.0* 14.2* 7.6  NEUTROABS 13.9*  --   --   HGB 14.9 14.4 13.2  HCT 43.7 43.2 40.2  MCV 88.1 89.3 90.1  PLT 209 182 [id number]   Basic Metabolic Panel: Recent Labs  Lab 08/18/22 0144 08/18/22 0652 08/19/22 0505  NA 139  --  140  K 3.3*  --  3.1*  CL 101  --  104  CO2 27  --  30  GLUCOSE 142*  --  96  BUN 41*  --  43*  CREATININE 1.05 1.03 0.98  CALCIUM 9.2  --  8.1*  MG  --   --  2.4  PHOS  --   --  3.4    Studies: DG ABD ACUTE 2+V W 1V  CHEST  Result Date: 08/19/2022 CLINICAL DATA:  Follow-up SBO EXAM: DG ABDOMEN ACUTE WITH 1 VIEW CHEST COMPARISON:  08/18/2022 FINDINGS: Low lung volumes. Mild bibasilar atelectasis. No definite pleural effusions. No pleural effusion or pneumothorax. The heart normal in size. Enteric tube terminates in the mid gastric body. Nonobstructive bowel gas pattern. No evidence of free air under the diaphragm on the upright view. Degenerative changes of the lower lumbar spine. IMPRESSION: Low lung volumes with mild bibasilar atelectasis. No evidence of small bowel obstruction or free air. Enteric tube terminates in the mid gastric body. Electronically Signed   By: Julian Hy M.D.   On: 08/19/2022 08:04    Scheduled Meds:  azelastine  1-2 spray Each Nare BID   cloNIDine  0.2 mg Transdermal Weekly   fluticasone  1-2 spray Each Nare Daily   latanoprost  1 drop Both Eyes QHS   metoprolol tartrate  5 mg Intravenous Q8H  Continuous Infusions:  lacosamide (VIMPAT) IV 100 mg (08/19/22 1055)   lactated ringers 75 mL/hr at 08/19/22 0406   levETIRAcetam 1,000 mg (08/19/22 0915)   potassium chloride 10 mEq (08/19/22 1239)   PRN Meds: acetaminophen **OR** acetaminophen, Benzocaine, hydrALAZINE, ketorolac, morphine injection, ondansetron **OR** ondansetron (ZOFRAN) IV, phenol  Time spent: 35 minutes  Author: Val Riles. MD [facility] 08/19/2022 1:17 PM  To reach On-call, see care teams to locate the attending and reach out to them via [email or web address]. If 7PM-7AM, please contact night-coverage If you still have difficulty reaching the attending provider, please page the [facility] (Director on Call) for [facility] on amion for assistance.

## 2022-08-19 NOTE — Progress Notes (Signed)
[facility] SURGICAL PROGRESS NOTE (cpt [phone])  Hospital Day(s): 1.  Interval History: Patient seen and examined, no acute events or new complaints overnight. Patient reports he is feeling better. Abdomen is sore but no significant pain. No fever, chills, nausea, emesis. Labs are reassuring aside from mild hypokalemia to 3.1 this AM. NGT output recorded at 725 ccs; bilious. KUB appears improved; gas in colon. No flatus yet.   Review of Systems:  Constitutional: denies fever, chills  HEENT: denies cough or congestion  Respiratory: denies any shortness of breath  Cardiovascular: denies chest pain or palpitations  Gastrointestinal: + abdominal pain (mild, soreness), denied N/V Genitourinary: denies burning with urination or urinary frequency Musculoskeletal: denies pain, decreased motor or sensation  Vital signs in last 24 hours: [min-max] current  Temp:  [97.7 F (36.5 C)-99.2 F (37.3 C)] 97.7 F (36.5 C) (02/15 0517) Pulse Rate:  [77-110] 77 (02/15 0517) Resp:  [14-22] 14 (02/15 0517) BP: (135-163)/(74-113) 147/74 (02/15 0517) SpO2:  [90 %-97 %] 90 % (02/15 0517) Weight:  [135 kg-138.3 kg] 135 kg (02/14 1335)     Height: 5' 7"$  (170.2 cm) Weight: 135 kg BMI (Calculated): 46.6   Intake/Output last 2 shifts:  02/14 0701 - 02/15 0700 In: 925.3 [I.V.:591; IV Piggyback:334.3] Out: 1475 [Urine:200; Emesis/NG output:725]   Physical Exam:  Constitutional: alert, cooperative and no distress  HENT: normocephalic without obvious abnormality; NGT in place  Eyes: PERRL, EOM's grossly intact and symmetric  Respiratory: breathing non-labored at rest  Cardiovascular: regular rate and sinus rhythm  Gastrointestinal: soft, non-tender, and non-distended, no rebound/guarding. He is certainly not peritonitic   Labs:     Latest Ref Rng & Units 08/19/2022    5:05 AM 08/18/2022    6:52 AM 08/18/2022    1:44 AM  CBC  WBC 4.0 - 10.5 K/uL 7.6  14.2  17.0   Hemoglobin 13.0 - 17.0  g/dL 13.2  14.4  14.9   Hematocrit 39.0 - 52.0 % 40.2  43.2  43.7   Platelets 150 - 400 K/uL 171  182  209       Latest Ref Rng & Units 08/19/2022    5:05 AM 08/18/2022    6:52 AM 08/18/2022    1:44 AM  CMP  Glucose 70 - 99 mg/dL 96   142   BUN 8 - 23 mg/dL 43   41   Creatinine 0.61 - 1.24 mg/dL 0.98  1.03  1.05   Sodium 135 - 145 mmol/L 140   139   Potassium 3.5 - 5.1 mmol/L 3.1   3.3   Chloride 98 - 111 mmol/L 104   101   CO2 22 - 32 mmol/L 30   27   Calcium 8.9 - 10.3 mg/dL 8.1   9.2   Total Protein 6.5 - 8.1 g/dL 6.3   8.0   Total Bilirubin 0.3 - 1.2 mg/dL 1.1   0.9   Alkaline Phos 38 - 126 U/L 74   100   AST 15 - 41 U/L 22   32   ALT 0 - 44 U/L 21   28      Imaging studies:   KUB (08/19/2022) personally reviewed which shows improvement in small bowel distension, there is air in colon, no free air, and radiologist report reviewed pending...   Assessment/Plan: (ICD-10's: K13.609) 62 y.o. male with radiographically improving SBO secondary to adhesions given complex surgical history   - Continue NGT decompression for now while awaiting ROBF; LIS; monitor and  record output   - Continue conservative efforts; he is very high risk for intervention surgically given his previous complex surgical history. No indication currently   - Continue NPO + IVF support - Replete K; monitor  - Serial KUB as needed  - Monitor abdominal examination; on-going bowel function   - Pain control prn; antiemetics prn - Mobilize - Further management per primary service; we will follow    All of the above findings and recommendations were discussed with the patient, patient's family (wife at bedside), and the medical team, and all of patient's and family's questions were answered to their expressed satisfaction.  -- Edison Simon, PA-C [facility] 08/19/2022, 7:07 AM M-F: 7am - 4pm

## 2022-08-20 ENCOUNTER — Ambulatory Visit: Payer: Self-pay | Admitting: Physician Assistant

## 2022-08-20 ENCOUNTER — Inpatient Hospital Stay: Payer: 59

## 2022-08-20 DIAGNOSIS — K56609 Unspecified intestinal obstruction, unspecified as to partial versus complete obstruction: Secondary | ICD-10-CM | POA: Diagnosis not present

## 2022-08-20 LAB — BASIC METABOLIC PANEL
Anion gap: 9 (ref 5–15)
Anion gap: 10 (ref 5–15)
BUN: 46 mg/dL — ABNORMAL HIGH (ref 8–23)
BUN: 35 mg/dL — ABNORMAL HIGH (ref 8–23)
CO2: 28 mmol/L (ref 22–32)
CO2: 25 mmol/L (ref 22–32)
Calcium: 7.9 mg/dL — ABNORMAL LOW (ref 8.9–10.3)
Calcium: 8.6 mg/dL — ABNORMAL LOW (ref 8.9–10.3)
Chloride: 99 mmol/L (ref 98–111)
Chloride: 102 mmol/L (ref 98–111)
Creatinine, Ser: 0.96 mg/dL (ref 0.61–1.24)
Creatinine, Ser: 0.9 mg/dL (ref 0.61–1.24)
GFR, Estimated: >60 mL/min (ref >60)
GFR, Estimated: >60 mL/min (ref >60)
Glucose, Bld: 82 mg/dL (ref 70–99)
Glucose, Bld: 133 mg/dL — ABNORMAL HIGH (ref 70–99)
Potassium: 3.1 mmol/L — ABNORMAL LOW (ref 3.5–5.1)
Potassium: 3.7 mmol/L (ref 3.5–5.1)
Sodium: 136 mmol/L (ref 135–145)
Sodium: 137 mmol/L (ref 135–145)

## 2022-08-20 LAB — CBC
HCT: 39.7 % (ref 39.0–52.0)
Hemoglobin: 13 g/dL (ref 13.0–17.0)
MCH: 29.4 pg (ref 26.0–34.0)
MCHC: 32.7 g/dL (ref 30.0–36.0)
MCV: 89.8 fL (ref 80.0–100.0)
Platelets: 159 10*3/uL (ref 150–400)
RBC: 4.42 MIL/uL (ref 4.22–5.81)
RDW: 12.5 % (ref 11.5–15.5)
WBC: 8.1 10*3/uL (ref 4.0–10.5)
nRBC: 0 % (ref 0.0–0.2)

## 2022-08-20 LAB — PHOSPHORUS: Phosphorus: 2.9 mg/dL (ref 2.5–4.6)

## 2022-08-20 LAB — MAGNESIUM: Magnesium: 2.2 mg/dL (ref 1.7–2.4)

## 2022-08-20 MED ORDER — POTASSIUM CHLORIDE CRYS ER 20 MEQ PO TBCR
40.0000 meq | EXTENDED_RELEASE_TABLET | Freq: Once | ORAL | Status: AC
  Administered 2022-08-20: 40 meq via ORAL
  Filled 2022-08-20: qty 2

## 2022-08-20 MED ORDER — ALBUMIN HUMAN 25 % IV SOLN
25.0000 g | Freq: Once | INTRAVENOUS | Status: AC
  Administered 2022-08-20: 25 g via INTRAVENOUS
  Filled 2022-08-20: qty 100

## 2022-08-20 MED ORDER — PANTOPRAZOLE SODIUM 40 MG PO TBEC: 40.0000 mg | DELAYED_RELEASE_TABLET | Freq: Every day | ORAL | Status: DC

## 2022-08-20 MED ORDER — PANTOPRAZOLE SODIUM 40 MG IV SOLR: 40.0000 mg | INTRAVENOUS | Status: DC

## 2022-08-20 MED ORDER — PANTOPRAZOLE SODIUM 40 MG IV SOLR
40.0000 mg | Freq: Two times a day (BID) | INTRAVENOUS | Status: DC
  Administered 2022-08-20 – 2022-08-21 (×3): 40 mg via INTRAVENOUS
  Filled 2022-08-20 (×3): qty 10

## 2022-08-20 MED ORDER — POTASSIUM CHLORIDE 10 MEQ/100ML IV SOLN
10.0000 meq | INTRAVENOUS | Status: AC
  Administered 2022-08-20 (×6): 10 meq via INTRAVENOUS
  Filled 2022-08-20 (×6): qty 100

## 2022-08-20 NOTE — Progress Notes (Signed)
[facility] Progress Note  Patient: Peter [name]    [id number]  DOA: 08/18/2022     Date of Service: the patient was seen and examined on 08/20/2022  Chief Complaint  Patient presents with   Abdominal Pain   Emesis   Brief hospital course: DAIR NECESSARY is a 62 y.o. male with PMH of HTN, bipolar d/o, GERD/ulcer, glaucoma, OSA, remote SBO, open abdomen with multiple laparotomies finally requiring closure with phasic's ST mesh at [facility].  He did develop necrosis of the abdominal wall, and morbid obesity presenting with abdominal pain.  ED workup, CT scan showed  Status post ventral hernia repair with probable mesh. Extensive associated dystrophic calcification. Mid small bowel obstruction with a single point of transition immediately subjacent to the repaired anterior abdominal wall, likely related to adhesion. General surgery was consulted, recommended conservative management and patient was admitted for further management as below.   Assessment and Plan: Principal Problem:   SBO (small bowel obstruction) (HCC) Active Problems:   Essential (primary) hypertension   Hypercholesterolemia without hypertriglyceridemia   Class 3 severe obesity due to excess calories with serious comorbidity and body mass index (BMI) of 45.0 to 49.9 in adult [facility])   Bipolar disorder with depression ([place])   Glaucoma (increased eye pressure)   DNR (do not resuscitate)     # SBO -Patient with prior h/o abdominal surgeries presenting with acute onset of abdominal pain with n/v, abdominal distention, and CT findings c/w SBO -Will admit to Med Surg -Gen Surg consulted by ER and will see; currently no indication for surgical intervention -80% of SBO will resolve without surgery -High-grade SBO can usually be safely managed non-operatively -If PO contrast reaches colon within 24 hours, SBO will very likely certainly resolve without surgery -NPO for bowel rest -NG tube ordered and now in place -IVF  hydration -Pain control with morphine -Current guidelines recommend that patients without resolution undergo surgery by 3-5 days    # Hypokalemia most likely due to gastric fluid loss via NG tube Potassium repleted Monitor electrolytes and replete as needed  Bipolar d/o -He normally takes sertraline, carbamazepine, lamotrigine -Neurology was asked to assist with transition to IV medications for now since he is strictly NPO   HTN -Unable to tolerate PO meds for now - hold amlodipine, chlorthalidone, losartan, clonidine PO, hydralazine, metoprolol -He was given Clonidine TTS, standing IV metoprolol -Will add prn IV hydralazine   HLD -Hold atorvastatin for now   Glaucoma -Continue latanoprost   Morbid obesity -Body mass index is 46.61 kg/m.  Intervention: Calorie restricted diet and daily exercise advised for weight loss  -Outpatient PCP/bariatric medicine/bariatric surgery f/u encouraged    OSA -CPAP ordered, but patient is unlikely to be able to wear it with NG tube in place   DNR -I have discussed code status with the patient and he would not desire resuscitation and would prefer to die a natural death should that situation arise. -He will need a gold out of facility DNR form at the time of discharge     Diet: NPO DVT Prophylaxis: SCDs  Advance goals of care discussion: DNR  Family Communication: family was present at bedside, at the time of interview.  The pt provided permission to discuss medical plan with the family. Opportunity was given to ask question and all questions were answered satisfactorily.   Disposition:  Pt is from Home, admitted with SBO, still has SBO, NPO, and NG tube, which precludes a safe discharge. Discharge to Home,  when clinically stable and cleared by general surgery.  Subjective: No significant events overnight, patient denies abdominal pain, did not have any bowel movements and not passing gas.   Denies any chest pain or palpitations, no  shortness of breath.   Physical Exam: General: NAD, lying comfortably Appear in no distress, affect appropriate Eyes: PERRLA ENT: Oral Mucosa Clear, moist  Neck: no JVD,  Cardiovascular: S1 and S2 Present, no Murmur,  Respiratory: good respiratory effort, Bilateral Air entry equal and Decreased, no Crackles, no wheezes Abdomen: Bowel Sound present, Soft, obese and no tenderness, NG tube intact  Skin: no rashes Extremities: no Pedal edema, no calf tenderness Neurologic: without any new focal findings Gait not checked due to patient safety concerns  Vitals:   08/19/22 1953 08/20/22 0502 08/20/22 0503 08/20/22 0819  BP: (!) 156/79 (!) 157/80  (!) 154/71  Pulse: 85 89 87 71  Resp: 20   18  Temp: 98.7 F (37.1 C) 98.6 F (37 C)  98.7 F (37.1 C)  TempSrc: Oral Oral  Oral  SpO2: 90% (!) 83% 93% 97%  Weight:      Height:        Intake/Output Summary (Last 24 hours) at 08/20/2022 1133 Last data filed at 08/20/2022 0000 Gross per 24 hour  Intake 850.11 ml  Output 1450 ml  Net -599.89 ml   Filed Weights   08/18/22 0136 08/18/22 0822 08/18/22 1335  Weight: (!) 138.3 kg (!) 138.3 kg 135 kg    Data Reviewed: I have personally reviewed and interpreted daily labs, tele strips, imagings as discussed above. I reviewed all nursing notes, pharmacy notes, vitals, pertinent old records I have discussed plan of care as described above with RN and patient/family.  CBC: Recent Labs  Lab 08/18/22 0144 08/18/22 0652 08/19/22 0505 08/20/22 0317  WBC 17.0* 14.2* 7.6 8.1  NEUTROABS 13.9*  --   --   --   HGB 14.9 14.4 13.2 13.0  HCT 43.7 43.2 40.2 39.7  MCV 88.1 89.3 90.1 89.8  PLT 209 182 171 [id number]   Basic Metabolic Panel: Recent Labs  Lab 08/18/22 0144 08/18/22 0652 08/19/22 0505 08/20/22 0317  NA 139  --  140 136  K 3.3*  --  3.1* 3.1*  CL 101  --  104 99  CO2 27  --  30 28  GLUCOSE 142*  --  96 82  BUN 41*  --  43* 46*  CREATININE 1.05 1.03 0.98 0.96  CALCIUM 9.2  --   8.1* 7.9*  MG  --   --  2.4 2.2  PHOS  --   --  3.4 2.9    Studies: DG ABD ACUTE 2+V W 1V CHEST  Result Date: 08/20/2022 CLINICAL DATA:  Small bowel obstruction EXAM: DG ABDOMEN ACUTE WITH 1 VIEW CHEST COMPARISON:  08/19/2022 FINDINGS: Mild eventration of the right hemidiaphragm with associated right basilar atelectasis. No frank interstitial edema. No pleural effusion or pneumothorax. The heart is normal in size. Enteric tube terminates in the proximal gastric body. Nonobstructive bowel gas pattern. No evidence of free air under the diaphragm on the upright view. Cholecystectomy clips. Degenerative changes of the visualized thoracolumbar spine. IMPRESSION: Mild right basilar atelectasis. No evidence of acute cardiopulmonary disease. Enteric tube terminates in the proximal gastric body. No evidence of small bowel obstruction or free air. Electronically Signed   By: Julian Hy M.D.   On: 08/20/2022 08:02    Scheduled Meds:  azelastine  1-2 spray Each Nare BID  cloNIDine  0.2 mg Transdermal Weekly   fluticasone  1-2 spray Each Nare Daily   latanoprost  1 drop Both Eyes QHS   metoprolol tartrate  5 mg Intravenous Q8H   Continuous Infusions:  lacosamide (VIMPAT) IV 100 mg (08/20/22 0928)   lactated ringers 125 mL/hr at 08/20/22 1132   levETIRAcetam 1,000 mg (08/20/22 0909)   potassium chloride 10 mEq (08/20/22 1130)   PRN Meds: acetaminophen **OR** acetaminophen, Benzocaine, hydrALAZINE, ketorolac, morphine injection, ondansetron **OR** ondansetron (ZOFRAN) IV, phenol  Time spent: 35 minutes  Author: Val Riles. MD [facility] 08/20/2022 11:33 AM  To reach On-call, see care teams to locate the attending and reach out to them via [email or web address]. If 7PM-7AM, please contact night-coverage If you still have difficulty reaching the attending provider, please page the [facility] (Director on Call) for [facility] on amion for assistance.

## 2022-08-20 NOTE — Progress Notes (Addendum)
[facility] SURGICAL PROGRESS NOTE (cpt [phone])  Hospital Day(s): 2.  Interval History: Patient seen and examined, no acute events or new complaints overnight. Patient reports he is feeling better. Abdomen is sore but no significant pain. No fever, chills, nausea, emesis. Labs are reassuring aside from, stable, mild hypokalemia to 3.1 this AM. NGT output recorded at 900 ccs; but down this AM. KUB continues to show resolution in SBO.   Review of Systems:  Constitutional: denies fever, chills  HEENT: denies cough or congestion  Respiratory: denies any shortness of breath  Cardiovascular: denies chest pain or palpitations  Gastrointestinal: denied abdominal pain, denied N/V Genitourinary: denies burning with urination or urinary frequency Musculoskeletal: denies pain, decreased motor or sensation  Vital signs in last 24 hours: [min-max] current  Temp:  [98.3 F (36.8 C)-98.7 F (37.1 C)] 98.6 F (37 C) (02/16 0502) Pulse Rate:  [74-89] 87 (02/16 0503) Resp:  [18-20] 20 (02/15 1953) BP: (135-157)/(74-80) 157/80 (02/16 0502) SpO2:  [83 %-93 %] 93 % (02/16 0503)     Height: 5' 7"$  (170.2 cm) Weight: 135 kg BMI (Calculated): 46.6   Intake/Output last 2 shifts:  02/15 0701 - 02/16 0700 In: 850.1 [I.V.:409; IV Piggyback:441.1] Out: 1450 [Urine:550; Emesis/NG output:900]   Physical Exam:  Constitutional: alert, cooperative and no distress  HENT: normocephalic without obvious abnormality; NGT in place  Eyes: PERRL, EOM's grossly intact and symmetric  Respiratory: breathing non-labored at rest  Cardiovascular: regular rate and sinus rhythm  Gastrointestinal: soft, non-tender, and non-distended, no rebound/guarding. He is certainly not peritonitic   Labs:     Latest Ref Rng & Units 08/20/2022    3:17 AM 08/19/2022    5:05 AM 08/18/2022    6:52 AM  CBC  WBC 4.0 - 10.5 K/uL 8.1  7.6  14.2   Hemoglobin 13.0 - 17.0 g/dL 13.0  13.2  14.4   Hematocrit 39.0 - 52.0 % 39.7   40.2  43.2   Platelets 150 - 400 K/uL 159  171  182       Latest Ref Rng & Units 08/20/2022    3:17 AM 08/19/2022    5:05 AM 08/18/2022    6:52 AM  CMP  Glucose 70 - 99 mg/dL 82  96    BUN 8 - 23 mg/dL 46  43    Creatinine 0.61 - 1.24 mg/dL 0.96  0.98  1.03   Sodium 135 - 145 mmol/L 136  140    Potassium 3.5 - 5.1 mmol/L 3.1  3.1    Chloride 98 - 111 mmol/L 99  104    CO2 22 - 32 mmol/L 28  30    Calcium 8.9 - 10.3 mg/dL 7.9  8.1    Total Protein 6.5 - 8.1 g/dL  6.3    Total Bilirubin 0.3 - 1.2 mg/dL  1.1    Alkaline Phos 38 - 126 U/L  74    AST 15 - 41 U/L  22    ALT 0 - 44 U/L  21       Imaging studies:   KUB (08/20/2022) personally reviewed with continued resolution in SBO process, and radiologist report reviewed: IMPRESSION: Mild right basilar atelectasis. No evidence of acute cardiopulmonary disease.   Enteric tube terminates in the proximal gastric body. No evidence of small bowel obstruction or free air.   Assessment/Plan: (ICD-10's: K36.609) 62 y.o. male with radiographically improving SBO secondary to adhesions given complex surgical history   - We will proceed with NGT  clamping trial this morning. Clamp NGT x4 hours. After 4 hours check residuals. If residuals are <150 ccs and he remains without nausea, distension, or pain, we can remove NGT.    - Continue conservative efforts; he is very high risk for intervention surgically given his previous complex surgical history. No indication currently   - Continue NPO + IVF support - Replete K; monitor  - Serial KUB as needed  - Monitor abdominal examination; on-going bowel function   - Pain control prn; antiemetics prn - Mobilize - Further management per primary service; we will follow    All of the above findings and recommendations were discussed with the patient, patient's family (wife at bedside), and the medical team, and all of patient's and family's questions were answered to their expressed  satisfaction.  -- Edison Simon, PA-C [facility] 08/20/2022, 7:15 AM M-F: 7am - 4pm

## 2022-08-21 DIAGNOSIS — K56609 Unspecified intestinal obstruction, unspecified as to partial versus complete obstruction: Secondary | ICD-10-CM | POA: Diagnosis not present

## 2022-08-21 LAB — BASIC METABOLIC PANEL
Anion gap: 8 (ref 5–15)
BUN: 24 mg/dL — ABNORMAL HIGH (ref 8–23)
CO2: 23 mmol/L (ref 22–32)
Calcium: 8.4 mg/dL — ABNORMAL LOW (ref 8.9–10.3)
Chloride: 107 mmol/L (ref 98–111)
Creatinine, Ser: 0.74 mg/dL (ref 0.61–1.24)
GFR, Estimated: >60 mL/min (ref >60)
Glucose, Bld: 98 mg/dL (ref 70–99)
Potassium: 3.9 mmol/L (ref 3.5–5.1)
Sodium: 138 mmol/L (ref 135–145)

## 2022-08-21 LAB — CBC
HCT: 39.1 % (ref 39.0–52.0)
Hemoglobin: 13.1 g/dL (ref 13.0–17.0)
MCH: 29.5 pg (ref 26.0–34.0)
MCHC: 33.5 g/dL (ref 30.0–36.0)
MCV: 88.1 fL (ref 80.0–100.0)
Platelets: 175 10*3/uL (ref 150–400)
RBC: 4.44 MIL/uL (ref 4.22–5.81)
RDW: 12.4 % (ref 11.5–15.5)
WBC: 8 10*3/uL (ref 4.0–10.5)
nRBC: 0 % (ref 0.0–0.2)

## 2022-08-21 LAB — MAGNESIUM: Magnesium: 2 mg/dL (ref 1.7–2.4)

## 2022-08-21 LAB — PHOSPHORUS: Phosphorus: 2 mg/dL — ABNORMAL LOW (ref 2.5–4.6)

## 2022-08-21 MED ORDER — K PHOS MONO-SOD PHOS DI & MONO 155-852-130 MG PO TABS
500.0000 mg | ORAL_TABLET | Freq: Four times a day (QID) | ORAL | Status: DC
  Administered 2022-08-21: 500 mg via ORAL
  Filled 2022-08-21 (×3): qty 2

## 2022-08-21 NOTE — Progress Notes (Signed)
[facility] SURGICAL PROGRESS NOTE (cpt [phone])  Hospital Day(s): 3.  Interval History: Patient seen and examined, no acute events or new complaints overnight. Patient reports he is feeling better. Abdomen is sore but no significant pain. No fever, chills, nausea, emesis.  [place] @ 3.9 this AM.  Tolerating full liquid diet thus far.  Having bowel movements and demanding discharge home today, despite cautions.   Review of Systems:  Constitutional: denies fever, chills  HEENT: denies cough or congestion  Respiratory: denies any shortness of breath  Cardiovascular: denies chest pain or palpitations  Gastrointestinal: denied abdominal pain, denied N/V Genitourinary: denies burning with urination or urinary frequency Musculoskeletal: denies pain, decreased motor or sensation  Vital signs in last 24 hours: [min-max] current  Temp:  [98.1 F (36.7 C)-98.6 F (37 C)] 98.6 F (37 C) (02/17 0815) Pulse Rate:  [62-70] 64 (02/17 0815) Resp:  [18] 18 (02/17 0815) BP: (127-160)/(50-91) 158/91 (02/17 0815) SpO2:  [91 %-95 %] 95 % (02/17 0815)     Height: 5' 7"$  (170.2 cm) Weight: 135 kg BMI (Calculated): 46.6   Intake/Output last 2 shifts:  02/16 0701 - 02/17 0700 In: 2722 [I.V.:2166.4; IV Piggyback:555.6] Out: 1000 [Urine:1000]   Physical Exam:  Constitutional: alert, cooperative and no distress  HENT: normocephalic without obvious abnormality; NGT in place  Eyes: PERRL, EOM's grossly intact and symmetric  Respiratory: breathing non-labored at rest  Cardiovascular: regular rate and sinus rhythm  Gastrointestinal: soft, non-tender, and non-distended, no rebound/guarding. He is certainly not peritonitic   Labs:     Latest Ref Rng & Units 08/21/2022    8:11 AM 08/20/2022    3:17 AM 08/19/2022    5:05 AM  CBC  WBC 4.0 - 10.5 K/uL 8.0  8.1  7.6   Hemoglobin 13.0 - 17.0 g/dL 13.1  13.0  13.2   Hematocrit 39.0 - 52.0 % 39.1  39.7  40.2   Platelets 150 - 400 K/uL 175  159   171       Latest Ref Rng & Units 08/21/2022    4:50 AM 08/20/2022    5:15 PM 08/20/2022    3:17 AM  CMP  Glucose 70 - 99 mg/dL 98  133  82   BUN 8 - 23 mg/dL 24  35  46   Creatinine 0.61 - 1.24 mg/dL 0.74  0.90  0.96   Sodium 135 - 145 mmol/L 138  137  136   Potassium 3.5 - 5.1 mmol/L 3.9  3.7  3.1   Chloride 98 - 111 mmol/L 107  102  99   CO2 22 - 32 mmol/L 23  25  28   $ Calcium 8.9 - 10.3 mg/dL 8.4  8.6  7.9      Imaging studies:   KUB (08/20/2022) personally reviewed with continued resolution in SBO process, and radiologist report reviewed: IMPRESSION: Mild right basilar atelectasis. No evidence of acute cardiopulmonary disease.   Enteric tube terminates in the proximal gastric body. No evidence of small bowel obstruction or free air.   Assessment/Plan: (ICD-10's: K68.609) 62 y.o. male with radiographically improving SBO secondary to adhesions given complex surgical history   - He will leave AMA if we don't participate in his discharge.  I believe he understands our concerns and desire to defer d/h.     - Continue gradual resumption of diet efforts at  home; he is very high risk for intervention surgically given his previous complex surgical history. He desires to f/u with Dr. Dahlia Byes  as outpt.    - Further management for discharge per primary service; we remain available.   All of the above findings and recommendations were discussed with the patient, patient's family (wife at bedside), and the medical team, and all of patient's and family's questions were answered to their expressed satisfaction.  Ronny Bacon, M.D., [facility]  08/21/2022 ; 11:28 AM

## 2022-08-21 NOTE — Discharge Summary (Signed)
[facility] Discharge Summary   Patient: Peter [name] [id number]  PCP: [name], MD  Date of admission: 08/18/2022   Date of discharge: 08/21/2022      Discharge Diagnoses:  Principal Problem:   SBO (small bowel obstruction) ([place]) Active Problems:   Essential (primary) hypertension   Hypercholesterolemia without hypertriglyceridemia   Class 3 severe obesity due to excess calories with serious comorbidity and body mass index (BMI) of 45.0 to 49.9 in adult [facility])   Bipolar disorder with depression ([place])   Glaucoma (increased eye pressure)   DNR (do not resuscitate)   Admitted From: Home Disposition:  Home   Recommendations for Outpatient Follow-up:  Follow-up with PCP in 1 week Follow-up with general surgery in 1 to 2 weeks as an outpatient, gradually advance diet. Follow up LABS/TEST:  none   Diet recommendation: Cardiac diet  Activity: The patient is advised to gradually reintroduce usual activities, as tolerated  Discharge Condition: stable  Code Status: DNR   History of present illness: As per the H and P dictated on admission  Hospital Course:  Peter [name] is a 62 y.o. male with PMH of HTN, bipolar d/o, GERD/ulcer, glaucoma, OSA, remote SBO, open abdomen with multiple laparotomies finally requiring closure with phasic's ST mesh at [facility].  He did develop necrosis of the abdominal wall, and morbid obesity presenting with abdominal pain.  ED workup, CT scan showed  Status post ventral hernia repair with probable mesh. Extensive associated dystrophic calcification. Mid small bowel obstruction with a single point of transition immediately subjacent to the repaired anterior abdominal wall, likely related to adhesion. General surgery was consulted, recommended conservative management and patient was admitted for further management as below.     Assessment and Plan: # SBO, resolved  Patient with prior h/o abdominal surgeries presenting with  acute onset of abdominal pain with n/v, abdominal distention, and CT findings c/w SBO. Gen Surg consulted by ER and will see; currently no indication for surgical intervention. 80% of SBO will resolve without surgery High-grade SBO can usually be safely managed non-operatively. If PO contrast reaches colon within 24 hours, SBO will very likely certainly resolve without surgery. S/p NPO and NG tube w/LIS.  NG tube was clamped and taken out, gradually diet was advanced.  Patient is moving bowels and passing gas.  Patient tolerated clear liquid diet, advance to full liquid.  Today patient is feeling back to normal, requesting to be discharged.  Patient was cleared by general surgery, advised to gradually advance diet and follow with surgery as an outpatient. # Hypokalemia most likely due to gastric fluid loss via NG tube. Potassium repleted. Resolved # Bipolar d/o, Resumed sertraline, carbamazepine, lamotrigine home meds # HTN, Resumed home meds amlodipine, chlorthalidone, losartan, clonidine PO, hydralazine, metoprolol.  Patient was advised to monitor BP and follow with PCP to titrate medications accordingly. # HLD, resumed atorvastatin # Glaucoma, Continue latanoprost # Morbid obesity: Body mass index is 46.61 kg/m.  Intervention: Calorie restricted diet and daily exercise advised for weight loss  # OSA: CPAP ordered   Patient was ambulatory without any assistance. On the day of the discharge the patient's vitals were stable, and no other acute medical condition were reported by patient. the patient was felt safe to be discharge at Home.  Consultants: General surgery Procedures: None  Discharge Exam: General: Appear in no distress, no Rash; Oral Mucosa Clear, moist. Cardiovascular: S1 and S2 Present, no Murmur, Respiratory: normal respiratory effort, Bilateral Air entry present and no  Crackles, no wheezes Abdomen: Bowel Sound present, Soft and no tenderness, no hernia Extremities: no Pedal  edema, no calf tenderness Neurology: alert and oriented to time, place, and person affect appropriate.  Filed Weights   08/18/22 0136 08/18/22 0822 08/18/22 1335  Weight: (!) 138.3 kg (!) 138.3 kg 135 kg   Vitals:   08/21/22 0355 08/21/22 0815  BP: (!) 160/73 (!) 158/91  Pulse: 62 64  Resp: 18 18  Temp:  98.6 F (37 C)  SpO2: 91% 95%    DISCHARGE MEDICATION: Allergies as of 08/21/2022       Reactions   Oxycodone Other (See Comments)   Causes Hallucinations, auditory and tactile   Isosorbide Nausea And Vomiting   Headache, nausea, vomiting        Medication List     TAKE these medications    amLODipine 10 MG tablet Commonly known as: NORVASC Take 1 tablet (10 mg total) by mouth daily. What changed: when to take this   aspirin EC 81 MG tablet Take 81 mg by mouth in the morning.   atorvastatin 40 MG tablet Commonly known as: LIPITOR Take 1 tablet (40 mg total) by mouth daily. What changed: when to take this   azelastine 0.1 % nasal spray Commonly known as: ASTELIN Place 1-2 sprays into both nostrils 2 (two) times daily.   carbamazepine 200 MG tablet Commonly known as: TEGRETOL TAKE THREE TABLETS BY MOUTH EVERY MORNING AND TAKE TWO TABLETS BY MOUTH AT BEDTIME   cetirizine 10 MG tablet Commonly known as: ZYRTEC Take 10 mg by mouth daily.   chlorthalidone 25 MG tablet Commonly known as: HYGROTON Take 1 tablet (25 mg total) by mouth daily. What changed: when to take this   cloNIDine 0.2 MG tablet Commonly known as: CATAPRES Take 1 tablet (0.2 mg total) by mouth 2 (two) times daily.   cyanocobalamin 1000 MCG tablet Commonly known as: VITAMIN B12 Take 1,000 mcg by mouth in the morning.   Fish Oil 1000 MG Caps Take 3,000 mg by mouth daily.   fluticasone 50 MCG/ACT nasal spray Commonly known as: FLONASE Place 1-2 sprays into both nostrils daily.   Garlic [id number] MG Caps Take 300 mg by mouth daily.   hydrALAZINE 100 MG tablet Commonly known as:  APRESOLINE TAKE ONE TABLET BY MOUTH THREE TIMES A DAY   lamoTRIgine 200 MG tablet Commonly known as: LAMICTAL TAKE 1 TABLET BY MOUTH TWICE A DAY   latanoprost 0.005 % ophthalmic solution Commonly known as: XALATAN Place 1 drop into both eyes at bedtime.   losartan 100 MG tablet Commonly known as: COZAAR Take 1 tablet (100 mg total) by mouth daily. What changed: when to take this   metoprolol tartrate 50 MG tablet Commonly known as: LOPRESSOR Take 1 tablet (50 mg total) by mouth 2 (two) times daily.   montelukast 10 MG tablet Commonly known as: SINGULAIR Take 10 mg by mouth in the morning.   Nat-Rul Vitamin D 25 MCG (1000 UT) tablet Generic drug: Cholecalciferol Take 4 tablets (4,000 Units total) by mouth daily. What changed: how much to take   omeprazole 40 MG capsule Commonly known as: PRILOSEC Take 40 mg by mouth in the morning.   potassium gluconate 595 (99 K) MG Tabs tablet Take 1,190 mg by mouth 2 (two) times daily.   sertraline 100 MG tablet Commonly known as: ZOLOFT Take 1 tablet (100 mg total) by mouth daily.       Allergies  Allergen Reactions   Oxycodone  Other (See Comments)    Causes Hallucinations, auditory and tactile   Isosorbide Nausea And Vomiting    Headache, nausea, vomiting   Discharge Instructions     Call MD for:  difficulty breathing, headache or visual disturbances   Complete by: As directed    Call MD for:  extreme fatigue   Complete by: As directed    Call MD for:  persistant dizziness or light-headedness   Complete by: As directed    Call MD for:  persistant nausea and vomiting   Complete by: As directed    Call MD for:  severe uncontrolled pain   Complete by: As directed    Call MD for:  temperature >100.4   Complete by: As directed    Diet - low sodium heart healthy   Complete by: As directed    Discharge instructions   Complete by: As directed    Follow-up with PCP in 1 week Follow-up with general surgery in 1 to 2  weeks as an outpatient, gradually advance diet.   Increase activity slowly   Complete by: As directed        The results of significant diagnostics from this hospitalization (including imaging, microbiology, ancillary and laboratory) are listed below for reference.    Significant Diagnostic Studies: DG ABD ACUTE 2+V W 1V CHEST  Result Date: 08/20/2022 CLINICAL DATA:  Small bowel obstruction EXAM: DG ABDOMEN ACUTE WITH 1 VIEW CHEST COMPARISON:  08/19/2022 FINDINGS: Mild eventration of the right hemidiaphragm with associated right basilar atelectasis. No frank interstitial edema. No pleural effusion or pneumothorax. The heart is normal in size. Enteric tube terminates in the proximal gastric body. Nonobstructive bowel gas pattern. No evidence of free air under the diaphragm on the upright view. Cholecystectomy clips. Degenerative changes of the visualized thoracolumbar spine. IMPRESSION: Mild right basilar atelectasis. No evidence of acute cardiopulmonary disease. Enteric tube terminates in the proximal gastric body. No evidence of small bowel obstruction or free air. Electronically Signed   By: Julian Hy M.D.   On: 08/20/2022 08:02   DG ABD ACUTE 2+V W 1V CHEST  Result Date: 08/19/2022 CLINICAL DATA:  Follow-up SBO EXAM: DG ABDOMEN ACUTE WITH 1 VIEW CHEST COMPARISON:  08/18/2022 FINDINGS: Low lung volumes. Mild bibasilar atelectasis. No definite pleural effusions. No pleural effusion or pneumothorax. The heart normal in size. Enteric tube terminates in the mid gastric body. Nonobstructive bowel gas pattern. No evidence of free air under the diaphragm on the upright view. Degenerative changes of the lower lumbar spine. IMPRESSION: Low lung volumes with mild bibasilar atelectasis. No evidence of small bowel obstruction or free air. Enteric tube terminates in the mid gastric body. Electronically Signed   By: Julian Hy M.D.   On: 08/19/2022 08:04   DG Abdomen 1 View  Result Date:  08/18/2022 CLINICAL DATA:  NG tube placement EXAM: ABDOMEN - 1 VIEW COMPARISON:  None Available. FINDINGS: NG tube with tip in the stomach. Side port essentially at the GE junction. IMPRESSION: NG tube in stomach with side port at GE junction. Electronically Signed   By: Suzy Bouchard M.D.   On: 08/18/2022 11:59   CT ABDOMEN PELVIS W CONTRAST  Result Date: 08/18/2022 CLINICAL DATA:  Abdominal pain, acute, nonlocalized EXAM: CT ABDOMEN AND PELVIS WITH CONTRAST TECHNIQUE: Multidetector CT imaging of the abdomen and pelvis was performed using the standard protocol following bolus administration of intravenous contrast. RADIATION DOSE REDUCTION: This exam was performed according to the departmental dose-optimization program which includes automated exposure  control, adjustment of the mA and/or kV according to patient size and/or use of iterative reconstruction technique. CONTRAST:  15m OMNIPAQUE IOHEXOL 350 MG/ML SOLN COMPARISON:  05/28/2020 FINDINGS: Lower chest: No acute abnormality.  Mild cardiomegaly. Hepatobiliary: No focal liver abnormality is seen. Status post cholecystectomy. No biliary dilatation. Pancreas: Unremarkable Spleen: Unremarkable Adrenals/Urinary Tract: Right adrenal gland is unremarkable. Dystrophic calcification within the left adrenal gland in keeping with remote adrenal hemorrhage. Left adrenal gland is otherwise unremarkable. Kidneys are normal. Bladder is unremarkable. Stomach/Bowel: There is extensive dystrophic calcification involving the ventral abdominal wall likely related to ventral hernia repair with mesh. A mid small bowel obstruction is present with a single point of transition immediately subjacent to the repaired anterior abdominal wall, best seen on axial image # 62/2 and sagittal image # 82/6. Distally, the small bowel is decompressed. The stomach and large bowel are unremarkable. Appendix absent. No free intraperitoneal gas or fluid. Vascular/Lymphatic: No significant  vascular findings are present. No enlarged abdominal or pelvic lymph nodes. Reproductive: Mild prostatic hypertrophy Other: No recurrent abdominal wall hernia Musculoskeletal: No acute bone abnormality. No lytic or blastic bone lesion. Degenerative changes are seen within the lumbar spine and left hip with extensive heterotopic ossifications seen posterior to the left hip. IMPRESSION: 1. Status post ventral hernia repair with probable mesh. Extensive associated dystrophic calcification. Mid small bowel obstruction with a single point of transition immediately subjacent to the repaired anterior abdominal wall, likely related to adhesion. 2. Mild cardiomegaly. 3. Dystrophic calcification within the left adrenal gland in keeping with remote adrenal hemorrhage. 4. Mild prostatic hypertrophy. Electronically Signed   By: AFidela SalisburyM.D.   On: 08/18/2022 03:32    Microbiology: Recent Results (from the past 240 hour(s))  Resp panel by RT-PCR (RSV, Flu A&B, Covid) Anterior Nasal Swab     Status: None   Collection Time: 08/18/22  2:47 AM   Specimen: Anterior Nasal Swab  Result Value Ref Range Status   SARS Coronavirus 2 by RT PCR NEGATIVE NEGATIVE Final    Comment: (NOTE) SARS-CoV-2 target nucleic acids are NOT DETECTED.  The SARS-CoV-2 RNA is generally detectable in upper respiratory specimens during the acute phase of infection. The lowest concentration of SARS-CoV-2 viral copies this assay can detect is 138 copies/mL. A negative result does not preclude SARS-Cov-2 infection and should not be used as the sole basis for treatment or other patient management decisions. A negative result may occur with  improper specimen collection/handling, submission of specimen other than nasopharyngeal swab, presence of viral mutation(s) within the areas targeted by this assay, and inadequate number of viral copies(<138 copies/mL). A negative result must be combined with clinical observations, patient history, and  epidemiological information. The expected result is Negative.  Fact Sheet for Patients:  hEntrepreneurPulse.com.au Fact Sheet for Healthcare Providers:  hIncredibleEmployment.be This test is no t yet approved or cleared by the [place] and  has been authorized for detection and/or diagnosis of SARS-CoV-2 by FDA under an Emergency Use Authorization (EUA). This EUA will remain  in effect (meaning this test can be used) for the duration of the COVID-19 declaration under Section 564(b)(1) of the Act, 21 U.S.C.section 360bbb-3(b)(1), unless the authorization is terminated  or revoked sooner.       Influenza A by PCR NEGATIVE NEGATIVE Final   Influenza B by PCR NEGATIVE NEGATIVE Final    Comment: (NOTE) The Xpert Xpress SARS-CoV-2/FLU/RSV plus assay is intended as an aid in the diagnosis of influenza from  Nasopharyngeal swab specimens and should not be used as a sole basis for treatment. Nasal washings and aspirates are unacceptable for Xpert Xpress SARS-CoV-2/FLU/RSV testing.  Fact Sheet for Patients: EntrepreneurPulse.com.au  Fact Sheet for Healthcare Providers: IncredibleEmployment.be  This test is not yet approved or cleared by the [place] FDA and has been authorized for detection and/or diagnosis of SARS-CoV-2 by FDA under an Emergency Use Authorization (EUA). This EUA will remain in effect (meaning this test can be used) for the duration of the COVID-19 declaration under Section 564(b)(1) of the Act, 21 U.S.C. section 360bbb-3(b)(1), unless the authorization is terminated or revoked.     Resp Syncytial Virus by PCR NEGATIVE NEGATIVE Final    Comment: (NOTE) Fact Sheet for Patients: EntrepreneurPulse.com.au  Fact Sheet for Healthcare Providers: IncredibleEmployment.be  This test is not yet approved or cleared by the [place] FDA and has been  authorized for detection and/or diagnosis of SARS-CoV-2 by FDA under an Emergency Use Authorization (EUA). This EUA will remain in effect (meaning this test can be used) for the duration of the COVID-19 declaration under Section 564(b)(1) of the Act, 21 U.S.C. section 360bbb-3(b)(1), unless the authorization is terminated or revoked.  Performed at [facility], [address] [place], [place] [address]   Blood culture (routine x 2)     Status: None (Preliminary result)   Collection Time: 08/18/22  4:35 AM   Specimen: BLOOD  Result Value Ref Range Status   Specimen Description BLOOD RIGHT [facility]  Final   Special Requests   Final    BOTTLES DRAWN AEROBIC AND ANAEROBIC Blood Culture adequate volume   Culture   Final    NO GROWTH 3 DAYS Performed at [facility], [address] [place], [place] [address]    Report Status PENDING  Incomplete  Blood culture (routine x 2)     Status: None (Preliminary result)   Collection Time: 08/18/22  4:35 AM   Specimen: BLOOD  Result Value Ref Range Status   Specimen Description BLOOD LEFT AC  Final   Special Requests   Final    BOTTLES DRAWN AEROBIC AND ANAEROBIC Blood Culture adequate volume   Culture   Final    NO GROWTH 3 DAYS Performed at [facility], [place], [place] [address]    Report Status PENDING  Incomplete     Labs: CBC: Recent Labs  Lab 08/18/22 0144 08/18/22 0652 08/19/22 0505 08/20/22 0317 08/21/22 0811  WBC 17.0* 14.2* 7.6 8.1 8.0  NEUTROABS 13.9*  --   --   --   --   HGB 14.9 14.4 13.2 13.0 13.1  HCT 43.7 43.2 40.2 39.7 39.1  MCV 88.1 89.3 90.1 89.8 88.1  PLT 209 182 171 159 0000000   Basic Metabolic Panel: Recent Labs  Lab 08/18/22 0144 08/18/22 0652 08/19/22 0505 08/20/22 0317 08/20/22 1715 08/21/22 0450  NA 139  --  140 136 137 138  K 3.3*  --  3.1* 3.1* 3.7 3.9  CL 101  --  104 99 102 107  CO2 27  --  30 28 25 23  $ GLUCOSE 142*  --  96 82 133* 98  BUN 41*  --   43* 46* 35* 24*  CREATININE 1.05 1.03 0.98 0.96 0.90 0.74  CALCIUM 9.2  --  8.1* 7.9* 8.6* 8.4*  MG  --   --  2.4 2.2  --  2.0  PHOS  --   --  3.4 2.9  --  2.0*   Liver Function Tests: Recent Labs  Lab 08/18/22 0144 08/19/22 0505  AST 32 22  ALT 28 21  ALKPHOS 100 74  BILITOT 0.9 1.1  PROT 8.0 6.3*  ALBUMIN 3.8 2.9*   Recent Labs  Lab 08/18/22 0144  LIPASE 82*   No results for input(s): "AMMONIA" in the last 168 hours. Cardiac Enzymes: No results for input(s): "CKTOTAL", "CKMB", "CKMBINDEX", "TROPONINI" in the last 168 hours. BNP (last 3 results) No results for input(s): "BNP" in the last 8760 hours. CBG: No results for input(s): "GLUCAP" in the last 168 hours.  Time spent: 35 minutes  Signed:  Val Riles  [facility] 08/21/2022  2:39 PM

## 2022-08-23 ENCOUNTER — Telehealth: Payer: Self-pay

## 2022-08-23 LAB — CULTURE, BLOOD (ROUTINE X 2)
Culture: NO GROWTH
Culture: NO GROWTH
Special Requests: ADEQUATE
Special Requests: ADEQUATE

## 2022-08-23 NOTE — Telephone Encounter (Signed)
Entered in Error

## 2022-08-23 NOTE — Transitions of Care (Post Inpatient/ED Visit) (Signed)
   08/23/2022  Name: Peter [name] MRN: [id number] DOB: 1960-12-26  Today's TOC FU Call Status: Today's TOC FU Call Status:: Successful TOC FU Call Competed TOC FU Call Complete Date: 08/23/22  Transition Care Management Follow-up Telephone Call Date of Discharge: 08/21/22 Discharge Facility: [facility]) Type of Discharge: Inpatient Admission Primary Inpatient Discharge Diagnosis:: intestinal obstruction How have you been since you were released from the hospital?: Better Any questions or concerns?: No  Items Reviewed: Did you receive and understand the discharge instructions provided?: Yes Medications obtained and verified?: Yes (Medications Reviewed) Any new allergies since your discharge?: No Dietary orders reviewed?: Yes Do you have support at home?: Yes People in Home: spouse  Home Care and Equipment/Supplies: [place] Ordered?: NA Any new equipment or medical supplies ordered?: NA  Functional Questionnaire: Do you need assistance with bathing/showering or dressing?: No Do you need assistance with meal preparation?: No Do you need assistance with eating?: No Do you have difficulty maintaining continence: No Do you need assistance with getting out of bed/getting out of a chair/moving?: No Do you have difficulty managing or taking your medications?: No  Folllow up appointments reviewed: PCP Follow-up appointment confirmed?: No (patient declines) [facility] Follow-up appointment confirmed?: No (patient states will call and schedule) Follow-Up Specialty Provider:: Dr Perrin Maltese Do you need transportation to your follow-up appointment?: No Do you understand care options if your condition(s) worsen?: Yes-patient verbalized understanding    [place], [place] Nurse Health Advisor Direct Dial [phone]

## 2022-09-06 ENCOUNTER — Encounter: Payer: Self-pay | Admitting: Surgery

## 2022-09-06 ENCOUNTER — Ambulatory Visit: Payer: 59 | Admitting: Surgery

## 2022-09-06 ENCOUNTER — Other Ambulatory Visit
Admission: RE | Admit: 2022-09-06 | Discharge: 2022-09-06 | Disposition: A | Discharge status: Home / Self Care | Payer: 59 | Source: Ambulatory Visit | Attending: Surgery | Admitting: Surgery

## 2022-09-06 VITALS — Wt 287.0 lb

## 2022-09-06 DIAGNOSIS — K284 Chronic or unspecified gastrojejunal ulcer with hemorrhage: Secondary | ICD-10-CM | POA: Diagnosis not present

## 2022-09-06 DIAGNOSIS — K56609 Unspecified intestinal obstruction, unspecified as to partial versus complete obstruction: Secondary | ICD-10-CM

## 2022-09-06 LAB — CBC WITH DIFFERENTIAL/PLATELET
Abs Immature Granulocytes: 0.04 10*3/uL (ref 0.00–0.07)
Basophils Absolute: 0.1 10*3/uL (ref 0.0–0.1)
Basophils Relative: 1 %
Eosinophils Absolute: 0.2 10*3/uL (ref 0.0–0.5)
Eosinophils Relative: 2 %
HCT: 40.7 % (ref 39.0–52.0)
Hemoglobin: 13.7 g/dL (ref 13.0–17.0)
Immature Granulocytes: 1 %
Lymphocytes Relative: 25 %
Lymphs Abs: 2 10*3/uL (ref 0.7–4.0)
MCH: 29.7 pg (ref 26.0–34.0)
MCHC: 33.7 g/dL (ref 30.0–36.0)
MCV: 88.3 fL (ref 80.0–100.0)
Monocytes Absolute: 0.7 10*3/uL (ref 0.1–1.0)
Monocytes Relative: 9 %
Neutro Abs: 5 10*3/uL (ref 1.7–7.7)
Neutrophils Relative %: 62 %
Platelets: 243 10*3/uL (ref 150–400)
RBC: 4.61 MIL/uL (ref 4.22–5.81)
RDW: 12.9 % (ref 11.5–15.5)
WBC: 7.9 10*3/uL (ref 4.0–10.5)
nRBC: 0 % (ref 0.0–0.2)

## 2022-09-06 LAB — COMPREHENSIVE METABOLIC PANEL
ALT: 24 U/L (ref 0–44)
AST: 29 U/L (ref 15–41)
Albumin: 3.4 g/dL — ABNORMAL LOW (ref 3.5–5.0)
Alkaline Phosphatase: 74 U/L (ref 38–126)
Anion gap: 9 (ref 5–15)
BUN: 28 mg/dL — ABNORMAL HIGH (ref 8–23)
CO2: 31 mmol/L (ref 22–32)
Calcium: 9 mg/dL (ref 8.9–10.3)
Chloride: 99 mmol/L (ref 98–111)
Creatinine, Ser: 1.15 mg/dL (ref 0.61–1.24)
GFR, Estimated: >60 mL/min (ref >60)
Glucose, Bld: 91 mg/dL (ref 70–99)
Potassium: 3 mmol/L — ABNORMAL LOW (ref 3.5–5.1)
Sodium: 139 mmol/L (ref 135–145)
Total Bilirubin: 0.5 mg/dL (ref 0.3–1.2)
Total Protein: 7.1 g/dL (ref 6.5–8.1)

## 2022-09-06 LAB — MAGNESIUM: Magnesium: 1.8 mg/dL (ref 1.7–2.4)

## 2022-09-06 LAB — PHOSPHORUS: Phosphorus: 2.7 mg/dL (ref 2.5–4.6)

## 2022-09-06 NOTE — Patient Instructions (Addendum)
Referral to [facility]. Please call their office [phone] to schedule an appointment.  Please follow up with your PCP on medications.   CT scheduled  Outpatient Imaging on 09/14/22 @ 1 pm. Nothing to eat/drink after 11 am.   Please go to the lab today.

## 2022-09-06 NOTE — Progress Notes (Unsigned)
Outpatient Surgical Follow Up  09/06/2022  Peter [name] is an 62 y.o. male.   Chief Complaint  Patient presents with   Follow-up    SBO    HPI: Peter [name] is 62 year old male well-known to me with a complex surgical history. He does have a history of open abdomen with multiple laparotomies finally requiring closure with phasix ST mesh at [facility].  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  Last bowel obstruction resolved with NG tube and now he is following up.  CT Chest/Abdomen/Pelvis was obtained and concerning for small bowel obstruction, there is heterotopic ossification throughout the abdominal wall.  Reports that over the last week or so he has intermittent abdominal pain some nausea and some vomiting.  He has some good days and bad days. Currently walks with a cane.  Wife is with him  Past Medical History:  Diagnosis Date   Bipolar disorder ([place])    Blood in stool    Difficult intubation    Dyspnea    GERD (gastroesophageal reflux disease)    Glaucoma    since 2004   Hypertension    since 2000   Obesity, unspecified    Sleep apnea    Ulcer 2011   gastric   Unspecified hemorrhoids without mention of complication     Past Surgical History:  Procedure Laterality Date   APPLICATION OF WOUND VAC N/A 11/16/2016   Procedure: APPLICATION OF WOUND VAC-ABDOMINAL;  Surgeon: Florene Glen, MD;  Location: [facility];  Service: General;  Laterality: N/A;   APPLICATION OF WOUND VAC N/A 11/18/2016   Procedure: APPLICATION OF WOUND VAC;  Surgeon: Florene Glen, MD;  Location: [facility];  Service: General;  Laterality: N/A;  change   APPLICATION OF WOUND VAC N/A 11/23/2016   Procedure: WOUND VAC CHANGE;  Surgeon: Olean Ree, MD;  Location: [facility];  Service: General;  Laterality: N/A;  wound vac application   CARDIAC CATHETERIZATION Left 06/23/2016   Procedure: Left Heart Cath and Coronary Angiography;  Surgeon: Nelva Bush, MD;   Location: [place] [facility];  Service: Cardiovascular;  Laterality: Left;   CHOLECYSTECTOMY  1992   COLONOSCOPY W/ POLYPECTOMY  05/22/2010   66mtransverse colon polyp, traditional serrated adenoma,negative for high grade dysplasia & malignancy. rectal polyp-589mnegative for dysplasia & malignancy.   COLONOSCOPY WITH PROPOFOL N/A 06/20/2019   Procedure: COLONOSCOPY WITH PROPOFOL;  Surgeon: VaLin LandsmanMD;  [facility];  Service: Gastroenterology;  Laterality: N/A;   COLONOSCOPY WITH PROPOFOL N/A 06/21/2019   Procedure: COLONOSCOPY WITH PROPOFOL;  Surgeon: VaLin LandsmanMD;  [facility];  Service: Gastroenterology;  Laterality: N/A;   DRESSING CHANGE UNDER ANESTHESIA N/A 11/21/2016   Procedure: DRESSING CHANGE UNDER ANESTHESIA;  Surgeon: CoFlorene GlenMD;  Location: [facility];  Service: General;  Laterality: N/A;   ESOPHAGOGASTRODUODENOSCOPY (EGD) WITH PROPOFOL N/A 06/20/2019   Procedure: ESOPHAGOGASTRODUODENOSCOPY (EGD) WITH PROPOFOL;  Surgeon: VaLin LandsmanMD;  Location: [facility];  Service: Gastroenterology;  Laterality: N/A;   KNEE SURGERY Left 2010   LAPAROSCOPIC APPENDECTOMY N/A 10/30/2016   Procedure: APPENDECTOMY LAPAROSCOPIC changed  to open application of wound vac;  Surgeon: DiJules HusbandsMD;  Location: [facility];  Service: General;  Laterality: N/A;   LAPAROTOMY N/A 11/11/2016   Procedure: EXPLORATORY LAPAROTOMY, DEBRIDEMENT OF ABDOMINAL WOUND, ABDOMINAL WABrutus Surgeon: PaJules HusbandsMD;  Location: [facility];  Service: General;  Laterality: N/A;   LAPAROTOMY N/A 11/12/2016  Procedure: EXPLORATORY LAPAROTOMY;  Surgeon: Clayburn Pert, MD;  Location: [facility];  Service: General;  Laterality: N/A;   LAPAROTOMY N/A 11/14/2016   Procedure: EXPLORATORY LAPAROTOMY; Irrigation, partial closure;  Surgeon: Clayburn Pert, MD;  Location: [facility];  Service: General;  Laterality: N/A;   TOTAL KNEE ARTHROPLASTY Left 03/21/2019    Procedure: TOTAL KNEE ARTHROPLASTY;  Surgeon: Lovell Sheehan, MD;  Location: [facility];  Service: Orthopedics;  Laterality: Left;    Family History  Problem Relation Age of Onset   Cancer Other        colon   Hyperlipidemia Mother    Hypertension Mother     Social History:  reports that he has never smoked. He has never used smokeless tobacco. He reports that he does not currently use alcohol. He reports that he does not use drugs.  Allergies:  Allergies  Allergen Reactions   Oxycodone Other (See Comments)    Causes Hallucinations, auditory and tactile   Isosorbide Nausea And Vomiting    Headache, nausea, vomiting    Medications reviewed.    ROS Full ROS performed and is otherwise negative other than what is stated in HPI   Wt 287 lb (130.2 kg)   BMI 44.95 kg/m   Physical Exam Vitals and nursing note reviewed. Exam conducted with a chaperone present.  Constitutional:      General: He is not in acute distress.    Appearance: Normal appearance. He is obese. He is not ill-appearing.  Cardiovascular:     Rate and Rhythm: Normal rate and regular rhythm.  Pulmonary:     Effort: Pulmonary effort is normal. No respiratory distress.     Breath sounds: Normal breath sounds.  Abdominal:     General: Abdomen is flat. There is no distension.     Palpations: Abdomen is soft. There is no mass.     Tenderness: There is abdominal tenderness. There is no rebound.     Hernia: No hernia is present.     Comments: Abdominal wall with some hardness related to heterotopic calcification, some diffuse tenderness w/o rebound  Skin:    General: Skin is warm and dry.     Capillary Refill: Capillary refill takes less than 2 seconds.  Neurological:     General: No focal deficit present.     Mental Status: He is alert and oriented to person, place, and time.  Psychiatric:        Mood and Affect: Mood normal.        Behavior: Behavior normal.        Thought Content: Thought content normal.         Judgment: Judgment normal.        Assessment/Plan: 62 year old male with prior history of open abdomen and abdominal catastrophe now presented with recurrent small bowel obstructions.  His abdominal wall is lined with heterotopic calcifications.  He has BMI 47. Continues to have some intermittent abdominal pain and nausea.  I will like to interrogate with another CT scan to assess for any potential abdominal pathology.  We will ask GI to see him as he had hx of peptic ulcer He is currently nontoxic and does not require hospitalization or immediate surgical intervention We will follow him up after imaging studies.  They are planning a trip to [place] and were hoping to get some stabilization before traveling Please note that I have spent 40 minutes  in this encounter including personally reviewing imaging studies, coordinating his care, placing orders, counseling the patient  and performing appropriate documentation    Caroleen Hamman, MD [facility] General Surgeon

## 2022-09-06 NOTE — Progress Notes (Signed)
I,[name],acting as a scribe for [employer], MD.,have documented all relevant documentation on the behalf of [name], MD,as directed by  [name], MD while in the presence of [name], MD.   Established patient visit   Patient: Peter [name]   DOB: 1961-06-04   62 y.o. Male  MRN: [id number] Visit Date: 09/07/2022  Today's healthcare provider: [name], MD   Chief Complaint  Patient presents with   Hospitalization Follow-up   Subjective    HPI Patient was seen at [facility] on 08/18/2022 and discharge on 08/21/2022 for SBO. Hospital Follow up, Fall   Peter [name] is a 62 y.o. male with hx notable for HTN, OSA and bowel evisceration presenting for hospital follow up for small bowel obstruction.    Hx obtained partially from patient and partially from chart review for encounter dates including 08/18/22-08/21/22 :   TOC follow up call was completed on 08/23/22   Disposition was to home  Notable events & treatment during hospitalization include:  -patient had CT imaging that showed a small bowel obstruction likely 2/2 to adhesion from prior surgery   Medication Changes at discharge:  -Meclizine was discontinued (hospital medication) -no other medication changes were noted and he was restarted on home medications prior to discharge   Other Follow Up since hospital D/C:  Patient saw Dr. Dahlia Byes (General surgery) yesterday and has appointment with GI 09/09/22. He is scheduled for a CT Scan on 09/14/22 .  Patient reports feeling fatigue, dizziness, vomiting  and still  has problems with his gi system since hospital discharge. States he breaks out into sweats associated w/ nausea, vomiting and dizziness Episodes sometimes occur after eating One time it happened after not eating much while at home and soon after eating salad, he had an occurrence Last episode was yesterday after he had some coffee   He denies palpitations during the episodes  When he has the episodes, he will lie down and sleep for 3 hours and then wakes up and the symptoms have resolved Patient states that he also feels unsteady on his feet and had a fall in the home where he hit the wooden floor   Medications: Outpatient Medications Prior to Visit  Medication Sig   amLODipine (NORVASC) 10 MG tablet Take 1 tablet (10 mg total) by mouth daily. (Patient taking differently: Take 10 mg by mouth in the morning.)   aspirin EC 81 MG tablet Take 81 mg by mouth in the morning.   atorvastatin (LIPITOR) 40 MG tablet Take 1 tablet (40 mg total) by mouth daily. (Patient taking differently: Take 40 mg by mouth every evening.)   azelastine (ASTELIN) 0.1 % nasal spray Place 1-2 sprays into both nostrils 2 (two) times daily.   carbamazepine (TEGRETOL) 200 MG tablet TAKE THREE TABLETS BY MOUTH EVERY MORNING AND TAKE TWO TABLETS BY MOUTH AT BEDTIME   cetirizine (ZYRTEC) 10 MG tablet Take 10 mg by mouth daily.   chlorthalidone (HYGROTON) 25 MG tablet Take 1 tablet (25 mg total) by mouth daily. (Patient taking differently: Take 25 mg by mouth every evening.)   Cholecalciferol (NAT-RUL VITAMIN D) 25 MCG (1000 UT) tablet Take 4 tablets (4,000 Units total) by mouth daily. (Patient taking differently: Take 200 Units by mouth daily.)   cloNIDine (CATAPRES) 0.2 MG tablet Take 1 tablet (0.2 mg total) by mouth 2 (two) times daily.   fluticasone (FLONASE) 50 MCG/ACT nasal spray Place 1-2 sprays into both nostrils daily.  Garlic [id number] MG CAPS Take 300 mg by mouth daily.   hydrALAZINE (APRESOLINE) 100 MG tablet TAKE ONE TABLET BY MOUTH THREE TIMES A DAY   lamoTRIgine (LAMICTAL) 200 MG tablet TAKE 1 TABLET BY MOUTH TWICE A DAY   latanoprost (XALATAN) 0.005 % ophthalmic solution Place 1 drop into both eyes at bedtime.   losartan (COZAAR) 100 MG tablet Take 1 tablet (100 mg total) by mouth daily. (Patient taking differently: Take 100 mg by mouth in the  morning.)   metoprolol tartrate (LOPRESSOR) 50 MG tablet Take 1 tablet (50 mg total) by mouth 2 (two) times daily.   montelukast (SINGULAIR) 10 MG tablet Take 10 mg by mouth in the morning.   Omega-3 Fatty Acids (FISH OIL) 1000 MG CAPS Take 3,000 mg by mouth daily.   omeprazole (PRILOSEC) 40 MG capsule Take 40 mg by mouth in the morning.   potassium gluconate 595 (99 K) MG TABS tablet Take 1,190 mg by mouth 2 (two) times daily.   sertraline (ZOLOFT) 100 MG tablet Take 1 tablet (100 mg total) by mouth daily.   vitamin B-12 (CYANOCOBALAMIN) 1000 MCG tablet Take 1,000 mcg by mouth in the morning.   No facility-administered medications prior to visit.    Review of Systems     Objective    BP 103/68 (BP Location: Left Arm, Patient Position: Sitting, Cuff Size: Large)   Pulse 73   Temp 98.3 F (36.8 C) (Oral)   Resp 16   Ht '5\' 7"'$  (1.702 m)   Wt 295 lb 9.6 oz (134.1 kg)   BMI 46.30 kg/m    Physical Exam Vitals reviewed.  Constitutional:      General: He is not in acute distress.    Appearance: Normal appearance. He is obese. He is not ill-appearing, toxic-appearing or diaphoretic.  HENT:     Head:     Comments: Left medial periorbital ecchymosis  Laceration (bandaged) above left eyebrow    Mouth/Throat:     Mouth: Mucous membranes are dry. No oral lesions.  Eyes:     Conjunctiva/sclera: Conjunctivae normal.  Neck:     Thyroid: No thyroid mass, thyromegaly or thyroid tenderness.     Vascular: No carotid bruit.  Cardiovascular:     Rate and Rhythm: Normal rate and regular rhythm.     Pulses: Normal pulses.     Heart sounds: Normal heart sounds. No murmur heard.    No friction rub. No gallop.  Pulmonary:     Effort: Pulmonary effort is normal. No respiratory distress.     Breath sounds: Normal breath sounds. No stridor. No wheezing, rhonchi or rales.  Abdominal:     General: Bowel sounds are normal. There is no distension.     Palpations: Abdomen is soft.     Tenderness:  There is no abdominal tenderness.  Musculoskeletal:     Right lower leg: No edema.     Left lower leg: No edema.  Lymphadenopathy:     Cervical: No cervical adenopathy.  Skin:    Findings: No erythema or rash.  Neurological:     Mental Status: He is alert and oriented to person, place, and time.       No results found for any visits on 09/07/22.  Assessment & Plan     Problem List Items Addressed This Visit       Cardiovascular and Mediastinum   Essential (primary) hypertension    Chronic  BP is hypotensive today  Recommended holding hydralazine '100mg'$  for the  next few days until he is able to increase oral hydration  Patient will follow up in 3 weeks  Patient advised to monitor BP before restarting hydralazine and given parameters of starting if BP >150/90 or holding if BP less than 100/70 See AVS         Digestive   Nausea and vomiting - Primary    New problem  Considered this is related to atherosclerosis of intestinal vascular, gastritis, gastroenteritis or inflammatory bowel problem  Also considered DM however last A1c in 2022 was 5, would like to recheck A1c if symptoms persist once patient has recovered from acute illness  Has been persistent since hospital discharge for SBO  Prescribed Zofran '8mg'$  TID PRN  Patient advised to increase oral hydration to include 6-8 bottles of water along with electrolyte solution  Patient has GI follow up appt scheduled for 09/08/22       Relevant Medications   ondansetron (ZOFRAN) 8 MG tablet     Other   Episode of dizziness    Acute issue  Suspect this is secondary to decreased volume in setting of emesis and decreased PO intake  Recommended increased oral fluid intake and continued use of assistive device (cane) for safe ambulation  Also adjusted blood pressure medication due to hypotension in clinic today        Hospital discharge follow-up    Reviewed hospital discharge summary and specialist note from visit with general  surgery 09/07/22  Adjusted medication for hypotension  Prescribed antiemetic for symptoms  Medication reconciliation completed today, no medications changes since hospitalization  Patient encouraged to continue PO potassium (reports he has supply at home) due to hypokalemia (3.0)from labs obtained 09/06/22        Return in about 3 weeks (around 09/28/2022) for BP .       The entirety of the information documented in the History of Present Illness, Review of Systems and Physical Exam were personally obtained by me. Portions of this information were initially documented by Lyndel Pleasure, CMA . I, [name], MD have reviewed the documentation above for thoroughness and accuracy.      [name], MD  [facility] [phone] (phone) [phone] (fax)  [place]

## 2022-09-07 ENCOUNTER — Encounter: Payer: Self-pay | Admitting: Family Medicine

## 2022-09-07 ENCOUNTER — Ambulatory Visit (INDEPENDENT_AMBULATORY_CARE_PROVIDER_SITE_OTHER): Payer: 59 | Admitting: Family Medicine

## 2022-09-07 VITALS — BP 103/68 | HR 73 | Temp 98.3°F | Resp 16 | Ht 67.0 in | Wt 295.6 lb

## 2022-09-07 DIAGNOSIS — R42 Dizziness and giddiness: Secondary | ICD-10-CM

## 2022-09-07 DIAGNOSIS — R112 Nausea with vomiting, unspecified: Secondary | ICD-10-CM | POA: Insufficient documentation

## 2022-09-07 DIAGNOSIS — I10 Essential (primary) hypertension: Secondary | ICD-10-CM | POA: Diagnosis not present

## 2022-09-07 DIAGNOSIS — Z09 Encounter for follow-up examination after completed treatment for conditions other than malignant neoplasm: Secondary | ICD-10-CM

## 2022-09-07 MED ORDER — ONDANSETRON HCL 8 MG PO TABS: 8.0000 mg | ORAL_TABLET | Freq: Three times a day (TID) | ORAL | 0 refills | Status: DC | PRN

## 2022-09-07 NOTE — Assessment & Plan Note (Signed)
Acute issue  Suspect this is secondary to decreased volume in setting of emesis and decreased PO intake  Recommended increased oral fluid intake and continued use of assistive device (cane) for safe ambulation  Also adjusted blood pressure medication due to hypotension in clinic today

## 2022-09-07 NOTE — Assessment & Plan Note (Signed)
Chronic  BP is hypotensive today  Recommended holding hydralazine '100mg'$  for the next few days until he is able to increase oral hydration  Patient will follow up in 3 weeks  Patient advised to monitor BP before restarting hydralazine and given parameters of starting if BP >150/90 or holding if BP less than 100/70 See AVS

## 2022-09-07 NOTE — Assessment & Plan Note (Addendum)
Reviewed hospital discharge summary and specialist note from visit with general surgery 09/07/22  Adjusted medication for hypotension  Prescribed antiemetic for symptoms  Medication reconciliation completed today, no medications changes since hospitalization  Patient encouraged to continue PO potassium (reports he has supply at home) due to hypokalemia (3.0)from labs obtained 09/06/22

## 2022-09-07 NOTE — Assessment & Plan Note (Addendum)
New problem  Considered this is related to atherosclerosis of intestinal vascular, gastritis, gastroenteritis or inflammatory bowel problem  Also considered DM however last A1c in 2022 was 5, would like to recheck A1c if symptoms persist once patient has recovered from acute illness  Has been persistent since hospital discharge for SBO  Prescribed Zofran '8mg'$  TID PRN  Patient advised to increase oral hydration to include 6-8 bottles of water along with electrolyte solution  Patient has GI follow up appt scheduled for 09/08/22

## 2022-09-07 NOTE — Patient Instructions (Signed)
Your blood pressure was low today  I would like for you to hold the next two doses of hydralazine today and check your blood pressure for the next few weeks around lunch and dinner time   If pressure if higher than 150/90, you can take the next dose of hydralazine. If your blood pressure is low (less than 100/70), please skip hydralazine for the next dose    Please aim for 6-8 bottles of water per day to help replenish fluid levels    I have prescribed a medication for you to take to help with the nausea.    Please follow up in 3 weeks (before the end of March) to reassess blood pressure medications

## 2022-09-09 ENCOUNTER — Encounter: Payer: Self-pay | Admitting: Gastroenterology

## 2022-09-09 ENCOUNTER — Ambulatory Visit: Payer: 59 | Admitting: Gastroenterology

## 2022-09-09 VITALS — BP 123/72 | HR 60 | Temp 98.0°F | Ht 67.0 in | Wt 300.0 lb

## 2022-09-09 DIAGNOSIS — R1010 Upper abdominal pain, unspecified: Secondary | ICD-10-CM

## 2022-09-09 DIAGNOSIS — Z1211 Encounter for screening for malignant neoplasm of colon: Secondary | ICD-10-CM | POA: Diagnosis not present

## 2022-09-09 DIAGNOSIS — R109 Unspecified abdominal pain: Secondary | ICD-10-CM

## 2022-09-09 NOTE — Progress Notes (Signed)
Cephas Darby, MD [address]  [place]  [place], [place] [address]  Main: [phone]  Fax: [phone]    Gastroenterology Consultation  Referring Provider:     [name], MD Primary Care Physician:  [name], MD Primary Gastroenterologist:  Dr. Cephas Darby Reason for Consultation: Upper abdominal pain associated with nausea and vomiting        HPI:   Peter [name] is a 62 y.o. male referred by Dr. [name], MD  for consultation & management of upper abdominal pain associated with intermittent episodes of nausea and vomiting.  Patient has multiple comorbidities as listed below who was recently admitted to [facility] in February secondary to small bowel obstruction which has resolved with conservative management and NG tube.  Patient had a follow-up with Dr. Dahlia Byes on 3/4 after he was discharged from hospital.  He was complaining of abdominal pain associated with nausea and vomiting.  Given history of peptic ulcer, he was recommended to follow-up with GI.  Patient reports that he has been on liquid diet only.  Reports having bowel movements regularly, denies any constipation.  He denies any weight loss.  He is status postcholecystectomy.   NSAIDs: None  Antiplts/Anticoagulants/Anti thrombotics: None  GI Procedures: Underwent colonoscopy 2018, poor prep  EGD and colonoscopy 06/20/2019 - Mucosal changes in the duodenum. Biopsied. - Closed previous gastrostomy present characterized by healthy appearing mucosa. - Esophagogastric landmarks identified. - Normal gastroesophageal junction. - Normal gastroesophageal junction and esophagus. Biopsied.  - Preparation of the colon was inadequate. - Two 5 to 8 mm polyps in the descending colon and in the transverse colon, removed with a cold snare. Resected and retrieved. - Stool in the entire examined colon. - The distal rectum and anal verge are normal on retroflexion view.  DIAGNOSIS:   A. DUODENUM, POLYPOID MUCOSA; COLD BIOPSY:  - DUODENAL MUCOSA WITH FEATURES OF PEPTIC DUODENITIS.  - NEGATIVE FOR FEATURES OF CELIAC, DYSPLASIA, AND MALIGNANCY.   B. ESOPHAGUS, RANDOM; COLD BIOPSY:  - BENIGN SQUAMOUS MUCOSA WITH NO SIGNIFICANT HISTOPATHOLOGIC CHANGE.  - NO INCREASE IN INTRAEPITHELIAL EOSINOPHILS (LESS THAN 2 PER HPF).   C. COLON POLYP, TRANSVERSE; COLD SNARE:  - SESSILE SERRATED POLYP.  - NEGATIVE FOR DYSPLASIA AND MALIGNANCY.   D. COLON POLYP, DESCENDING; COLD SNARE:  - SESSILE SERRATED POLYP.  - NEGATIVE FOR DYSPLASIA AND MALIGNANCY.   Past Medical History:  Diagnosis Date   Bipolar disorder ([place])    Blood in stool    Difficult intubation    Dyspnea    GERD (gastroesophageal reflux disease)    Glaucoma    since 2004   Hypertension    since 2000   Obesity, unspecified    Sleep apnea    Ulcer 2011   gastric   Unspecified hemorrhoids without mention of complication     Past Surgical History:  Procedure Laterality Date   APPLICATION OF WOUND VAC N/A 11/16/2016   Procedure: APPLICATION OF WOUND VAC-ABDOMINAL;  Surgeon: Florene Glen, MD;  Location: [facility];  Service: General;  Laterality: N/A;   APPLICATION OF WOUND VAC N/A 11/18/2016   Procedure: APPLICATION OF WOUND VAC;  Surgeon: Florene Glen, MD;  Location: [facility];  Service: General;  Laterality: N/A;  change   APPLICATION OF WOUND VAC N/A 11/23/2016   Procedure: WOUND VAC CHANGE;  Surgeon: Olean Ree, MD;  Location: [facility];  Service: General;  Laterality: N/A;  wound vac application   CARDIAC CATHETERIZATION Left 06/23/2016  Procedure: Left Heart Cath and Coronary Angiography;  Surgeon: Nelva Bush, MD;  Location: [facility];  Service: Cardiovascular;  Laterality: Left;   CHOLECYSTECTOMY  1992   COLONOSCOPY W/ POLYPECTOMY  05/22/2010   39mtransverse colon polyp, traditional serrated adenoma,negative for high grade dysplasia & malignancy. rectal polyp-528mnegative for  dysplasia & malignancy.   COLONOSCOPY WITH PROPOFOL N/A 06/20/2019   Procedure: COLONOSCOPY WITH PROPOFOL;  Surgeon: VaLin LandsmanMD;  [facility];  Service: Gastroenterology;  Laterality: N/A;   COLONOSCOPY WITH PROPOFOL N/A 06/21/2019   Procedure: COLONOSCOPY WITH PROPOFOL;  Surgeon: VaLin LandsmanMD;  [facility];  Service: Gastroenterology;  Laterality: N/A;   DRESSING CHANGE UNDER ANESTHESIA N/A 11/21/2016   Procedure: DRESSING CHANGE UNDER ANESTHESIA;  Surgeon: CoFlorene GlenMD;  Location: [facility];  Service: General;  Laterality: N/A;   ESOPHAGOGASTRODUODENOSCOPY (EGD) WITH PROPOFOL N/A 06/20/2019   Procedure: ESOPHAGOGASTRODUODENOSCOPY (EGD) WITH PROPOFOL;  Surgeon: VaLin LandsmanMD;  Location: [facility];  Service: Gastroenterology;  Laterality: N/A;   KNEE SURGERY Left 2010   LAPAROSCOPIC APPENDECTOMY N/A 10/30/2016   Procedure: APPENDECTOMY LAPAROSCOPIC changed  to open application of wound vac;  Surgeon: DiJules HusbandsMD;  Location: [facility];  Service: General;  Laterality: N/A;   LAPAROTOMY N/A 11/11/2016   Procedure: EXPLORATORY LAPAROTOMY, DEBRIDEMENT OF ABDOMINAL WOUND, ABDOMINAL WAMolalla Surgeon: PaJules HusbandsMD;  Location: [facility];  Service: General;  Laterality: N/A;   LAPAROTOMY N/A 11/12/2016   Procedure: EXPLORATORY LAPAROTOMY;  Surgeon: WoClayburn PertMD;  Location: [facility];  Service: General;  Laterality: N/A;   LAPAROTOMY N/A 11/14/2016   Procedure: EXPLORATORY LAPAROTOMY; Irrigation, partial closure;  Surgeon: WoClayburn PertMD;  Location: [facility];  Service: General;  Laterality: N/A;   TOTAL KNEE ARTHROPLASTY Left 03/21/2019   Procedure: TOTAL KNEE ARTHROPLASTY;  Surgeon: BoLovell SheehanMD;  Location: [facility];  Service: Orthopedics;  Laterality: Left;     Current Outpatient Medications:    amLODipine (NORVASC) 10 MG tablet, Take 1 tablet (10 mg total) by mouth daily. (Patient taking differently:  Take 10 mg by mouth in the morning.), Disp: 90 tablet, Rfl: 1   aspirin EC 81 MG tablet, Take 81 mg by mouth in the morning., Disp: , Rfl:    atorvastatin (LIPITOR) 40 MG tablet, Take 1 tablet (40 mg total) by mouth daily. (Patient taking differently: Take 40 mg by mouth every evening.), Disp: 90 tablet, Rfl: 2   azelastine (ASTELIN) 0.1 % nasal spray, Place 1-2 sprays into both nostrils 2 (two) times daily., Disp: , Rfl:    carbamazepine (TEGRETOL) 200 MG tablet, TAKE THREE TABLETS BY MOUTH EVERY MORNING AND TAKE TWO TABLETS BY MOUTH AT BEDTIME, Disp: 450 tablet, Rfl: 0   cetirizine (ZYRTEC) 10 MG tablet, Take 10 mg by mouth daily., Disp: , Rfl:    chlorthalidone (HYGROTON) 25 MG tablet, Take 1 tablet (25 mg total) by mouth daily. (Patient taking differently: Take 25 mg by mouth every evening.), Disp: 90 tablet, Rfl: 1   Cholecalciferol (NAT-RUL VITAMIN D) 25 MCG (1000 UT) tablet, Take 4 tablets (4,000 Units total) by mouth daily. (Patient taking differently: Take 200 Units by mouth daily.), Disp: 120 tablet, Rfl: 0   cloNIDine (CATAPRES) 0.2 MG tablet, Take 1 tablet (0.2 mg total) by mouth 2 (two) times daily., Disp: 180 tablet, Rfl: 1   fluticasone (FLONASE) 50 MCG/ACT nasal spray, Place 1-2 sprays into both nostrils daily., Disp: , Rfl:  Garlic [id number] MG CAPS, Take 300 mg by mouth daily., Disp: , Rfl:    hydrALAZINE (APRESOLINE) 100 MG tablet, TAKE ONE TABLET BY MOUTH THREE TIMES A DAY, Disp: 270 tablet, Rfl: 1   lamoTRIgine (LAMICTAL) 200 MG tablet, TAKE 1 TABLET BY MOUTH TWICE A DAY, Disp: 60 tablet, Rfl: 0   latanoprost (XALATAN) 0.005 % ophthalmic solution, Place 1 drop into both eyes at bedtime., Disp: , Rfl:    losartan (COZAAR) 100 MG tablet, Take 1 tablet (100 mg total) by mouth daily. (Patient taking differently: Take 100 mg by mouth in the morning.), Disp: 90 tablet, Rfl: 1   metoprolol tartrate (LOPRESSOR) 50 MG tablet, Take 1 tablet (50 mg total) by mouth 2 (two) times daily., Disp: 180  tablet, Rfl: 1   montelukast (SINGULAIR) 10 MG tablet, Take 10 mg by mouth in the morning., Disp: , Rfl:    Omega-3 Fatty Acids (FISH OIL) 1000 MG CAPS, Take 3,000 mg by mouth daily., Disp: , Rfl:    omeprazole (PRILOSEC) 40 MG capsule, Take 40 mg by mouth in the morning., Disp: , Rfl:    ondansetron (ZOFRAN) 8 MG tablet, Take 1 tablet (8 mg total) by mouth every 8 (eight) hours as needed for nausea or vomiting., Disp: 30 tablet, Rfl: 0   potassium gluconate 595 (99 K) MG TABS tablet, Take 1,190 mg by mouth 2 (two) times daily., Disp: , Rfl:    sertraline (ZOLOFT) 100 MG tablet, Take 1 tablet (100 mg total) by mouth daily., Disp: 90 tablet, Rfl: 1   vitamin B-12 (CYANOCOBALAMIN) 1000 MCG tablet, Take 1,000 mcg by mouth in the morning., Disp: , Rfl:   Family History  Problem Relation Age of Onset   Cancer Other        colon   Hyperlipidemia Mother    Hypertension Mother      Social History   Tobacco Use   Smoking status: Never   Smokeless tobacco: Never  Vaping Use   Vaping Use: Never used  Substance Use Topics   Alcohol use: Not Currently    Comment: occasionally   Drug use: No    Allergies as of 09/09/2022 - Review Complete 09/09/2022  Allergen Reaction Noted   Oxycodone Other (See Comments) 11/27/2016   Isosorbide Nausea And Vomiting 07/28/2016    Review of Systems:    All systems reviewed and negative except where noted in HPI.   Physical Exam:  BP 123/72   Pulse 60   Temp 98 F (36.7 C) (Oral)   Ht '5\' 7"'$  (1.702 m)   Wt 300 lb (136.1 kg)   BMI 46.99 kg/m  No LMP for male patient.  General:   Alert,  Well-developed, well-nourished, pleasant and cooperative in NAD Head:  Normocephalic and atraumatic. Eyes:  Sclera clear, no icterus.   Conjunctiva pink. Ears:  Normal auditory acuity. Nose:  No deformity, discharge, or lesions. Mouth:  No deformity or lesions,oropharynx pink & moist. Neck:  Supple; no masses or thyromegaly. Lungs:  Respirations even and  unlabored.  Clear throughout to auscultation.   No wheezes, crackles, or rhonchi. No acute distress. Heart:  Regular rate and rhythm; no murmurs, clicks, rubs, or gallops. Abdomen:  Normal bowel sounds. Soft, non-tender, palpable phlegmon/mesh in the mid upper abdomen measuring about golfball size and non-distended without masses, hepatosplenomegaly or hernias noted.  No guarding or rebound tenderness.   Rectal: Not performed Msk:  Symmetrical without gross deformities. Good, equal movement & strength bilaterally. Pulses:  Normal pulses  noted. Extremities:  No clubbing or edema.  No cyanosis. Neurologic:  Alert and oriented x3;  grossly normal neurologically. Skin:  Intact without significant lesions or rashes. No jaundice. Psych:  Alert and cooperative. Normal mood and affect.  Imaging Studies: Reviewed  Assessment and Plan:   Peter [name] is a 62 y.o. male with morbid obesity, hypertension, status post laparoscopic appendectomy converted to open complicated by abdominal wound evisceration, wound VAC, surgical debridements seen in consultation for upper abdominal pain associated with intermittent episodes of nausea and vomiting  Recommend EGD for further evaluation Continue omeprazole 40 mg daily  Sessile serrated polyps <1cm in the colon, fair prep Patient is past due for surveillance colonoscopy, supposed to be in 2023 with 2-day prep.  He is agreeable to undergo   Follow up based on the above workup   Cephas Darby, MD

## 2022-09-10 ENCOUNTER — Ambulatory Visit (INDEPENDENT_AMBULATORY_CARE_PROVIDER_SITE_OTHER): Payer: 59 | Admitting: Physician Assistant

## 2022-09-10 ENCOUNTER — Encounter: Payer: Self-pay | Admitting: Physician Assistant

## 2022-09-10 DIAGNOSIS — G4700 Insomnia, unspecified: Secondary | ICD-10-CM

## 2022-09-10 DIAGNOSIS — F411 Generalized anxiety disorder: Secondary | ICD-10-CM | POA: Diagnosis not present

## 2022-09-10 DIAGNOSIS — F319 Bipolar disorder, unspecified: Secondary | ICD-10-CM | POA: Diagnosis not present

## 2022-09-10 MED ORDER — LAMOTRIGINE 200 MG PO TABS: 200.0000 mg | ORAL_TABLET | Freq: Two times a day (BID) | ORAL | 5 refills | Status: DC

## 2022-09-10 MED ORDER — CARBAMAZEPINE 200 MG PO TABS: ORAL_TABLET | ORAL | 5 refills | Status: DC

## 2022-09-10 NOTE — Progress Notes (Unsigned)
[facility] Med Check  Patient ID: Peter [name],  MRN: [id number]  PCP: Peter Foster, MD  Date of Evaluation: 09/10/2022 Time spent:30 minutes  Chief Complaint:  Chief Complaint   Follow-up    HISTORY/CURRENT STATUS: For routine med check.  Accompanied by his wife, Peter [name].   Was hospitalized 08/18/2022, had bowel obstruction.  He is seeing a Psychologist, sport and exercise, gastroenterologist, and several other specialist.  He tripped on rug in his house, fell, left black eye.  States he was not dizzy and did not pass out he just tripped.  He did not seek medical attention his physical health problems have been very discouraging, but overall feels like his mental health is good considering his problems.  He feels like his medications are working well.  He does not really feel like enjoying anything because of his physical health.  Energy and motivation are fair.  Not able to work right now.  No extreme sadness, tearfulness, or feelings of hopelessness.  Sleeps well most of the time. ADLs and personal hygiene are normal.   Denies any changes in concentration, making decisions, or remembering things.  Appetite has not changed.  Weight is stable.  Denies suicidal or homicidal thoughts.  Patient denies increased energy with decreased need for sleep, increased talkativeness, racing thoughts, impulsivity or risky behaviors, increased spending, increased libido, grandiosity, increased irritability or anger, paranoia, or hallucinations.  Denies dizziness, syncope, seizures, numbness, tingling, tremor, tics, unsteady gait, slurred speech, confusion. Denies muscle or joint pain, stiffness, or dystonia. Denies unexplained weight loss, frequent infections, or sores that heal slowly.  No polyphagia, polydipsia, or polyuria. Denies visual changes or paresthesias.   Individual Medical History/ Review of Systems: Changes? :Yes    see HPI  Past medications for mental health diagnoses include: Zoloft,  Cymbalta did not help, Pristiq did not help, Seroquel, Xanax, Depakote, Lamictal, Equetro, Klonopin, Modafinil didn't help energy.   Allergies: Oxycodone and Isosorbide  Current Medications:  Current Outpatient Medications:    amLODipine (NORVASC) 10 MG tablet, Take 1 tablet (10 mg total) by mouth daily. (Patient taking differently: Take 10 mg by mouth in the morning.), Disp: 90 tablet, Rfl: 1   aspirin EC 81 MG tablet, Take 81 mg by mouth in the morning., Disp: , Rfl:    atorvastatin (LIPITOR) 40 MG tablet, Take 1 tablet (40 mg total) by mouth daily. (Patient taking differently: Take 40 mg by mouth every evening.), Disp: 90 tablet, Rfl: 2   azelastine (ASTELIN) 0.1 % nasal spray, Place 1-2 sprays into both nostrils 2 (two) times daily., Disp: , Rfl:    cetirizine (ZYRTEC) 10 MG tablet, Take 10 mg by mouth daily., Disp: , Rfl:    chlorthalidone (HYGROTON) 25 MG tablet, Take 1 tablet (25 mg total) by mouth daily. (Patient taking differently: Take 25 mg by mouth every evening.), Disp: 90 tablet, Rfl: 1   Cholecalciferol (NAT-RUL VITAMIN D) 25 MCG (1000 UT) tablet, Take 4 tablets (4,000 Units total) by mouth daily. (Patient taking differently: Take 200 Units by mouth daily.), Disp: 120 tablet, Rfl: 0   cloNIDine (CATAPRES) 0.2 MG tablet, Take 1 tablet (0.2 mg total) by mouth 2 (two) times daily., Disp: 180 tablet, Rfl: 1   fluticasone (FLONASE) 50 MCG/ACT nasal spray, Place 1-2 sprays into both nostrils daily., Disp: , Rfl:    Garlic [id number] MG CAPS, Take 300 mg by mouth daily., Disp: , Rfl:    hydrALAZINE (APRESOLINE) 100 MG tablet, TAKE ONE TABLET BY MOUTH THREE TIMES A DAY, Disp: 270  tablet, Rfl: 1   latanoprost (XALATAN) 0.005 % ophthalmic solution, Place 1 drop into both eyes at bedtime., Disp: , Rfl:    losartan (COZAAR) 100 MG tablet, Take 1 tablet (100 mg total) by mouth daily. (Patient taking differently: Take 100 mg by mouth in the morning.), Disp: 90 tablet, Rfl: 1   metoprolol tartrate  (LOPRESSOR) 50 MG tablet, Take 1 tablet (50 mg total) by mouth 2 (two) times daily., Disp: 180 tablet, Rfl: 1   montelukast (SINGULAIR) 10 MG tablet, Take 10 mg by mouth in the morning., Disp: , Rfl:    Omega-3 Fatty Acids (FISH OIL) 1000 MG CAPS, Take 3,000 mg by mouth daily., Disp: , Rfl:    omeprazole (PRILOSEC) 40 MG capsule, Take 40 mg by mouth in the morning., Disp: , Rfl:    ondansetron (ZOFRAN) 8 MG tablet, Take 1 tablet (8 mg total) by mouth every 8 (eight) hours as needed for nausea or vomiting., Disp: 30 tablet, Rfl: 0   potassium gluconate 595 (99 K) MG TABS tablet, Take 1,190 mg by mouth 2 (two) times daily., Disp: , Rfl:    sertraline (ZOLOFT) 100 MG tablet, Take 1 tablet (100 mg total) by mouth daily., Disp: 90 tablet, Rfl: 1   vitamin B-12 (CYANOCOBALAMIN) 1000 MCG tablet, Take 1,000 mcg by mouth in the morning., Disp: , Rfl:    carbamazepine (TEGRETOL) 200 MG tablet, TAKE THREE TABLETS BY MOUTH EVERY MORNING AND TAKE TWO TABLETS BY MOUTH AT BEDTIME, Disp: 450 tablet, Rfl: 5   lamoTRIgine (LAMICTAL) 200 MG tablet, Take 1 tablet (200 mg total) by mouth 2 (two) times daily., Disp: 60 tablet, Rfl: 5 Medication Side Effects: none  Family Medical/ Social History: Changes? No  MENTAL HEALTH EXAM:  There were no vitals taken for this visit.There is no height or weight on file to calculate BMI.  General Appearance: Casual, Neat, Well Groomed and Obese  Eye Contact:  Good  Speech:  Clear and Coherent and Normal Rate  Volume:  Normal  Mood:   sad  Affect:  Congruent  Thought Process:  Goal Directed and Descriptions of Associations: Circumstantial  Orientation:  Full (Time, Place, and Person)  Thought Content: Logical   Suicidal Thoughts:  No  Homicidal Thoughts:  No  Memory:  WNL  Judgement:  Good  Insight:  Good  Psychomotor Activity:  Normal  Concentration:  Concentration: Good and Attention Span: Good  Recall:  Good  Fund of Knowledge: Good  Language: Good  Assets:   Desire for Improvement  ADL's:  Intact  Cognition: WNL  Prognosis:  [facility] records, including labs, from 08/18/2022 were reviewed.  Potassium noted.  Recommend he contact the ordering provider to ask about this.  Patient and his wife state he has had a low potassium for years.  05/31/2022  Carbamazepine level 10.6  Lamictal level 3.4  DIAGNOSES:    ICD-10-CM   1. Bipolar I disorder ([place])  F31.9     2. Generalized anxiety disorder  F41.1     3. Insomnia, unspecified type  G47.00      Receiving Psychotherapy: No   RECOMMENDATIONS:  PDMP was reviewed.  Last Xanax filled 03/27/2021.  Modafinil filled 05/15/2021. I provided 30 minutes of face to face time during this encounter, including time spent before and after the visit in records review, medical decision making, counseling pertinent to today's visit, and charting.   As far as his mental health medications go he is doing well so no  changes will be made.  Continue Xanax 0.5 mg, 1 p.o. twice daily as needed. Continue carbamazepine 200 mg, 3 p.o. every morning, 2 p.o. nightly. Continue Clonidine 0.2 mg, bid. Continue Lamictal 200 mg 1 p.o. twice daily.  Continue Zoloft 100 mg, 1 p.o.  daily. Continue multivitamin, B complex, omega-3, vitamin D per med sheet. Return in 6 months.Donnal Moat, PA-C

## 2022-09-13 ENCOUNTER — Encounter: Payer: Self-pay | Admitting: Physician Assistant

## 2022-09-14 ENCOUNTER — Ambulatory Visit
Admission: RE | Admit: 2022-09-14 | Discharge: 2022-09-14 | Disposition: A | Discharge status: Home / Self Care | Payer: 59 | Source: Ambulatory Visit | Attending: Surgery | Admitting: Surgery

## 2022-09-14 DIAGNOSIS — N281 Cyst of kidney, acquired: Secondary | ICD-10-CM | POA: Diagnosis not present

## 2022-09-14 DIAGNOSIS — K56609 Unspecified intestinal obstruction, unspecified as to partial versus complete obstruction: Secondary | ICD-10-CM | POA: Diagnosis not present

## 2022-09-14 DIAGNOSIS — K3189 Other diseases of stomach and duodenum: Secondary | ICD-10-CM | POA: Diagnosis not present

## 2022-09-14 MED ORDER — IOHEXOL 300 MG/ML  SOLN
100.0000 mL | Freq: Once | INTRAMUSCULAR | Status: AC | PRN
  Administered 2022-09-14: 100 mL via INTRAVENOUS

## 2022-09-15 ENCOUNTER — Ambulatory Visit: Payer: 59 | Admitting: Surgery

## 2022-09-15 ENCOUNTER — Other Ambulatory Visit: Payer: Self-pay

## 2022-09-15 ENCOUNTER — Encounter: Payer: Self-pay | Admitting: Surgery

## 2022-09-15 VITALS — BP 166/104 | HR 56 | Temp 97.9°F | Ht 67.0 in | Wt 298.0 lb

## 2022-09-15 DIAGNOSIS — K56609 Unspecified intestinal obstruction, unspecified as to partial versus complete obstruction: Secondary | ICD-10-CM | POA: Diagnosis not present

## 2022-09-15 NOTE — Patient Instructions (Signed)
Please call the office if you have any questions or concerns.  

## 2022-09-17 ENCOUNTER — Encounter: Payer: Self-pay | Admitting: Surgery

## 2022-09-17 NOTE — Progress Notes (Signed)
Outpatient Surgical Follow Up  09/17/2022  Peter [name] is an 62 y.o. male.   Chief Complaint  Patient presents with   Follow-up    SBO    HPI: Peter [name] is 62 year old male well-known to me with a complex surgical history. He does have a history of open abdomen with multiple laparotomies finally requiring closure with phasix ST mesh at [facility].  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  Last bowel obstruction resolved with NG tube and now he is following up. Improving slowly. Takin PO, no fevers or chills . + Flatus , no vomiting. Keeping himself hydrated. Recent CT pers. Reviewed, no evidence of obstruction or acute issues, calcification within the abd wall GI planning to do endoscopic evaluation  Past Medical History:  Diagnosis Date   Bipolar disorder ([place])    Blood in stool    Difficult intubation    Dyspnea    GERD (gastroesophageal reflux disease)    Glaucoma    since 2004   Hypertension    since 2000   Obesity, unspecified    Sleep apnea    Ulcer 2011   gastric   Unspecified hemorrhoids without mention of complication     Past Surgical History:  Procedure Laterality Date   APPLICATION OF WOUND VAC N/A 11/16/2016   Procedure: APPLICATION OF WOUND VAC-ABDOMINAL;  Surgeon: Florene Glen, MD;  Location: [facility] ORS;  Service: General;  Laterality: N/A;   APPLICATION OF WOUND VAC N/A 11/18/2016   Procedure: APPLICATION OF WOUND VAC;  Surgeon: Florene Glen, MD;  Location: [facility];  Service: General;  Laterality: N/A;  change   APPLICATION OF WOUND VAC N/A 11/23/2016   Procedure: WOUND VAC CHANGE;  Surgeon: Olean Ree, MD;  Location: [facility];  Service: General;  Laterality: N/A;  wound vac application   CARDIAC CATHETERIZATION Left 06/23/2016   Procedure: Left Heart Cath and Coronary Angiography;  Surgeon: Nelva Bush, MD;  Location: [facility];  Service: Cardiovascular;  Laterality: Left;   CHOLECYSTECTOMY   1992   COLONOSCOPY W/ POLYPECTOMY  05/22/2010   40mmtransverse colon polyp, traditional serrated adenoma,negative for high grade dysplasia & malignancy. rectal polyp-24mm,negative for dysplasia & malignancy.   COLONOSCOPY WITH PROPOFOL N/A 06/20/2019   Procedure: COLONOSCOPY WITH PROPOFOL;  Surgeon: Lin Landsman, MD;  Location: [facility];  Service: Gastroenterology;  Laterality: N/A;   COLONOSCOPY WITH PROPOFOL N/A 06/21/2019   Procedure: COLONOSCOPY WITH PROPOFOL;  Surgeon: Lin Landsman, MD;  Location: [facility] ENDOSCOPY;  Service: Gastroenterology;  Laterality: N/A;   DRESSING CHANGE UNDER ANESTHESIA N/A 11/21/2016   Procedure: DRESSING CHANGE UNDER ANESTHESIA;  Surgeon: Florene Glen, MD;  Location: [facility];  Service: General;  Laterality: N/A;   ESOPHAGOGASTRODUODENOSCOPY (EGD) WITH PROPOFOL N/A 06/20/2019   Procedure: ESOPHAGOGASTRODUODENOSCOPY (EGD) WITH PROPOFOL;  Surgeon: Lin Landsman, MD;  Location: [facility] ENDOSCOPY;  Service: Gastroenterology;  Laterality: N/A;   KNEE SURGERY Left 2010   LAPAROSCOPIC APPENDECTOMY N/A 10/30/2016   Procedure: APPENDECTOMY LAPAROSCOPIC changed  to open application of wound vac;  Surgeon: [name], MD;  Location: [facility];  Service: General;  Laterality: N/A;   LAPAROTOMY N/A 11/11/2016   Procedure: EXPLORATORY LAPAROTOMY, DEBRIDEMENT OF ABDOMINAL WOUND, ABDOMINAL [place];  Surgeon: [name], MD;  Location: [facility];  Service: General;  Laterality: N/A;   LAPAROTOMY N/A 11/12/2016   Procedure: EXPLORATORY LAPAROTOMY;  Surgeon: [place] Pert, MD;  Location: [facility];  Service: General;  Laterality: N/A;   LAPAROTOMY N/A 11/14/2016   Procedure: EXPLORATORY LAPAROTOMY; Irrigation, partial closure;  Surgeon: [place] Pert, MD;  Location: [facility];  Service: General;  Laterality: N/A;   TOTAL KNEE ARTHROPLASTY Left 03/21/2019   Procedure: TOTAL KNEE ARTHROPLASTY;  Surgeon: Lovell Sheehan, MD;  Location: [facility];  Service:  Orthopedics;  Laterality: Left;    Family History  Problem Relation Age of Onset   Cancer Other        colon   Hyperlipidemia Mother    Hypertension Mother     Social History:  reports that he has never smoked. He has never used smokeless tobacco. He reports that he does not currently use alcohol. He reports that he does not use drugs.  Allergies:  Allergies  Allergen Reactions   Oxycodone Other (See Comments)    Causes Hallucinations, auditory and tactile   Isosorbide Nausea And Vomiting    Headache, nausea, vomiting    Medications reviewed.    ROS Full ROS performed and is otherwise negative other than what is stated in HPI   BP (!) 166/104   Pulse (!) 56   Temp 97.9 F (36.6 C) (Oral)   Ht 5\' 7"  (1.702 m)   Wt 298 lb (135.2 kg)   SpO2 97%   BMI 46.67 kg/m   Physical Exam Vitals and nursing note reviewed. Exam conducted with a chaperone present.  Constitutional:      General: He is not in acute distress.    Appearance: Normal appearance. He is obese. He is not ill-appearing.  Cardiovascular:     Rate and Rhythm: Normal rate and regular rhythm.  Pulmonary:     Effort: Pulmonary effort is normal. No respiratory distress.     Breath sounds: Normal breath sounds.  Abdominal:     General: Abdomen is flat. There is no distension.     Palpations: Abdomen is soft. There is no mass.     Tenderness: no tendernes There is no rebound.     Hernia: No hernia is present.     Comments: Abdominal wall with some hardness related to heterotopic calcification,  Skin:    General: Skin is warm and dry.     Capillary Refill: Capillary refill takes less than 2 seconds.  Neurological:     General: No focal deficit present.     Mental Status: He is alert and oriented to person, place, and time.  Psychiatric:        Mood and Affect: Mood normal.        Behavior: Behavior normal.        Thought Content: Thought content normal.        Judgment: Judgment normal.          Assessment/Plan: 62 year old male with prior history of open abdomen and abdominal catastrophe now presented with recurrent small bowel obstructions.  His abdominal wall is lined with heterotopic calcifications.   Resolved SBO, no evidence of acute surgical issues at this time. May f/u w GI Please note that I have spent 30 minutes  in this encounter including personally reviewing imaging studies, coordinating his care, placing orders, counseling the patient and performing appropriate documentation    Caroleen Hamman, MD [facility] General Surgeon

## 2022-09-21 ENCOUNTER — Emergency Department
Admission: EM | Admit: 2022-09-21 | Discharge: 2022-09-21 | Disposition: A | Discharge status: Home / Self Care | Payer: 59 | Attending: Emergency Medicine | Admitting: Emergency Medicine

## 2022-09-21 ENCOUNTER — Emergency Department: Payer: 59

## 2022-09-21 ENCOUNTER — Encounter: Payer: Self-pay | Admitting: Emergency Medicine

## 2022-09-21 ENCOUNTER — Other Ambulatory Visit: Payer: Self-pay

## 2022-09-21 DIAGNOSIS — I10 Essential (primary) hypertension: Secondary | ICD-10-CM | POA: Diagnosis not present

## 2022-09-21 DIAGNOSIS — Z23 Encounter for immunization: Secondary | ICD-10-CM | POA: Insufficient documentation

## 2022-09-21 DIAGNOSIS — S0990XA Unspecified injury of head, initial encounter: Secondary | ICD-10-CM | POA: Diagnosis not present

## 2022-09-21 DIAGNOSIS — W010XXA Fall on same level from slipping, tripping and stumbling without subsequent striking against object, initial encounter: Secondary | ICD-10-CM | POA: Insufficient documentation

## 2022-09-21 DIAGNOSIS — S0181XA Laceration without foreign body of other part of head, initial encounter: Secondary | ICD-10-CM

## 2022-09-21 DIAGNOSIS — W19XXXA Unspecified fall, initial encounter: Secondary | ICD-10-CM

## 2022-09-21 MED ORDER — LIDOCAINE-EPINEPHRINE 2 %-1:100000 IJ SOLN
20.0000 mL | Freq: Once | INTRAMUSCULAR | Status: AC
  Administered 2022-09-21: 20 mL
  Filled 2022-09-21: qty 1

## 2022-09-21 MED ORDER — TETANUS-DIPHTH-ACELL PERTUSSIS 5-2.5-18.5 LF-MCG/0.5 IM SUSY
0.5000 mL | PREFILLED_SYRINGE | Freq: Once | INTRAMUSCULAR | Status: AC
  Administered 2022-09-21: 0.5 mL via INTRAMUSCULAR
  Filled 2022-09-21: qty 0.5

## 2022-09-21 NOTE — ED Notes (Signed)
Patient states he has had increased falls over the past few weeks.

## 2022-09-21 NOTE — ED Triage Notes (Signed)
Patient to ED via [facility] from home after a fall. Patient states he tripped and fell hitting head on a machine. Lac noted to the middle of forehead. Bleeding controlled at those time. Denies LOC but is on blood thinners.

## 2022-09-21 NOTE — Discharge Instructions (Addendum)
Please keep your wound clean by washing at least daily with soap and water.  After washing, apply antibiotic ointment to the area. If you see any signs of infection like spreading redness, pus coming from the wound, extreme pain, fevers, chills or any other worsening doctor right away or come back to the emergency department  Your sutures are absorbable, they will fall out in a few days.  Take acetaminophen 650 mg and ibuprofen 400 mg every 6 hours for pain.  Take with food.  Thank you for choosing [place] for your health care today!  Please see your primary doctor this week for a follow up appointment.   Sometimes, in the early stages of certain disease courses it is difficult to detect in the emergency department evaluation -- so, it is important that you continue to monitor your symptoms and call your doctor right away or return to the emergency department if you develop any new or worsening symptoms.  Please go to the following website to schedule new (and existing) patient appointments:   [email or web address]  If you do not have a primary doctor try calling the following clinics to establish care:  If you have insurance:  [facility] [phone] [place] [address]   [facility]  [phone] [place] [address]   If you do not have insurance:  [facility]  [phone] [address] [place] [address]   The following is another list of primary care offices in the area who are accepting new patients at this time.  Please reach out to one of them directly and let them know you would like to schedule an appointment to follow up on an Emergency Department visit, and/or to establish a new primary care provider (PCP).  There are likely other primary care clinics in the are who are accepting new patients, but this is an excellent place to start:  [place] physician: Dr Lavon Paganini [address] [place], [place] [address] [phone]  [facility] Lead Physician: Dr Steele Sizer [address], [place], [place] [address] [phone]  [place] Physician: Dr Park Liter [address] [place], [place] [address] [phone]  [facility] Lead Physician: Dr Dewaine Oats [address], [place], [place] [address] [phone]  [place] [facility] [place] Physician: Dr Halina Maidens [address] [place],  [address] [phone]   It was my pleasure to care for you today.   [place]ette Jacelyn Grip, MD

## 2022-09-21 NOTE — ED Provider Notes (Signed)
[facility] Provider Note    Event Date/Time   First MD Initiated Contact with Patient 09/21/22 [phone]     (approximate)   History   Fall   HPI  Peter [name] is a 62 y.o. male   Past medical history of bipolar, GERD, hypertension, recent SBO hospitalization who presents to the emergency department with an slip and fall at work with a head strike.  No loss of consciousness, no blood thinner use.  He tripped over his own feet and fell into a machine striking his forehead.  He is otherwise been in his regular state of health and recovering well from his SBO recently.  No other injuries noted by patient.  No other pain.  Obvious laceration to the forehead hemostatic  Independent Historian contributed to assessment above: EMS  External Medical Documents Reviewed: Discharge summary from 08/21/2022 for SBO managed nonoperatively      Physical Exam   Triage Vital Signs: ED Triage Vitals  Enc Vitals Group     BP      Pulse      Resp      Temp      Temp src      SpO2      Weight      Height      Head Circumference      Peak Flow      Pain Score      Pain Loc      Pain Edu?      Excl. in [place]?     Most recent vital signs: Vitals:   09/21/22 0721  BP: (!) 180/93  Pulse: 73  Resp: 18  Temp: 97.9 F (36.6 C)  SpO2: 93%    General: Awake, no distress.  CV:  Good peripheral perfusion.  Resp:  Normal effort.  Abd:  No distention.  Other:  Forehead laceration, hypertensive otherwise vital signs normal.  No active bleeding.  Extraocular movements intact no malocclusion no other facial deformities or tenderness neck supple with full range of motion no C-spine tenderness.  The remainder of the secondary survey reveals no obvious deformities or tenderness to palpation moving all extremities with full active range of motion, thorax, abdomen, pelvis and back were palpated without any deformities or tenderness.   ED Results / Procedures /  Treatments   Labs (all labs ordered are listed, but only abnormal results are displayed) Labs Reviewed - No data to display    RADIOLOGY I independently reviewed and interpreted CT head see no obvious bleeding or midline shift   PROCEDURES:  Critical Care performed: No  ..Laceration Repair  Date/Time: 09/21/2022 8:36 AM  Performed by: Lucillie Garfinkel, MD Authorized by: Lucillie Garfinkel, MD   Consent:    Consent obtained:  Verbal   Consent given by:  Patient   Risks discussed:  Infection, need for additional repair, poor cosmetic result, vascular damage, tendon damage, poor wound healing and nerve damage   Alternatives discussed:  No treatment, delayed treatment and observation Universal protocol:    Procedure explained and questions answered to patient or proxy's satisfaction: yes     Patient identity confirmed:  Verbally with patient Anesthesia:    Anesthesia method:  Local infiltration   Local anesthetic:  Lidocaine 1% WITH epi Laceration details:    Location:  Face   Face location:  Forehead   Length (cm):  6   Depth (mm):  5 Pre-procedure details:    Preparation:  Patient was prepped and  draped in usual sterile fashion Exploration:    Hemostasis achieved with:  Epinephrine and direct pressure   Wound exploration: wound explored through full range of motion     Wound extent: no foreign body     Contaminated: no   Treatment:    Area cleansed with:  Saline   Amount of cleaning:  Extensive   Irrigation solution:  Tap water   Irrigation method:  Syringe   Visualized foreign bodies/material removed: no     Debridement:  None   Undermining:  None   Scar revision: no   Skin repair:    Repair method:  Sutures   Suture size:  5-0   Wound skin closure material used: rapide vicryl.   Suture technique:  Simple interrupted   Number of sutures:  8 Approximation:    Approximation:  Close Repair type:    Repair type:  Simple Post-procedure details:    Dressing:  Open (no  dressing)   Procedure completion:  Tolerated    MEDICATIONS ORDERED IN ED: Medications  lidocaine-EPINEPHrine (XYLOCAINE W/EPI) 2 %-1:100000 (with pres) injection 20 mL (20 mLs Other Given 09/21/22 0735)  Tdap (BOOSTRIX) injection 0.5 mL (0.5 mLs Intramuscular Given 09/21/22 0732)     IMPRESSION / MDM / [place] / ED COURSE  I reviewed the triage vital signs and the nursing notes.                                Patient's presentation is most consistent with acute presentation with potential threat to life or bodily function.  Differential diagnosis includes, but is not limited to, blunt traumatic injury leading to intracranial bleeding, laceration of the forehead   The patient is on the cardiac monitor to evaluate for evidence of arrhythmia and/or significant heart rate changes.  MDM: This is a patient with a very clear mechanical slip and fall, doubt other medical causes of fall given no presyncope or other complaints from patient other than tripping and falling.  He has a laceration to his forehead which is hemostatic.  I will get a CT of the head to rule out intracranial bleeding.  No other tenderness or deformities noted to the face or neck or other in the body.  Unknown Tdap will administer.  I considered hospitalization for admission or observation however given stability of patient and negative CT scan I think outpatient monitoring and follow-up most appropriate at this time.        FINAL CLINICAL IMPRESSION(S) / ED DIAGNOSES   Final diagnoses:  Minor head injury, initial encounter  Fall, initial encounter  Forehead laceration, initial encounter     Rx / DC Orders   ED Discharge Orders     None        Note:  This document was prepared using Dragon voice recognition software and may include unintentional dictation errors.    Lucillie Garfinkel, MD 09/21/22 [phone]

## 2022-09-21 NOTE — ED Notes (Signed)
Patient to CT at this time

## 2022-09-23 NOTE — Progress Notes (Signed)
I,[name],acting as a scribe for [name], FNP.,have documented all relevant documentation on the behalf of [name], FNP,as directed by  [name], FNP while in the presence of [name], FNP.   Established patient visit   Patient: Peter [name]   DOB: [birth year]   62 y.o. Male  MRN: [id number] Visit Date: 09/24/2022  Today's healthcare provider: [name], FNP  Introduced to nurse practitioner role and practice setting.  All questions answered.  Discussed provider/patient relationship and expectations.  Subjective    HPI HPI   ED f/u-ED f/u-pt stated--injury fell forward...feeling ok, bruises/swollen both eye, 8 stitched on the forehead. Last edited by Elta Guadeloupe, CMA on 09/24/2022 10:23 AM.      Follow up Hospitalization  Patient was admitted to [facility] on 09/21/2022 and discharged on 09/21/2022. He was treated for minor head injury. Treatment for this included CT Head scan without contrast,Lidocaine-Epinephrine 20 mL, and forehead laceration repair.   He reports good compliance with treatment. He reports this condition is improved.  ----------------------------------------------------------------------------------------- -   Medications: Outpatient Medications Prior to Visit  Medication Sig   amLODipine (NORVASC) 10 MG tablet Take 1 tablet (10 mg total) by mouth daily. (Patient taking differently: Take 10 mg by mouth in the morning.)   aspirin (ASPIRIN 81) 81 MG chewable tablet Aspir-81   aspirin EC 81 MG tablet Take 81 mg by mouth in the morning.   atorvastatin (LIPITOR) 40 MG tablet Take 1 tablet (40 mg total) by mouth daily. (Patient taking differently: Take 40 mg by mouth every evening.)   azelastine (ASTELIN) 0.1 % nasal spray Place 1-2 sprays into both nostrils 2 (two) times daily.   carbamazepine (TEGRETOL) 200 MG tablet TAKE THREE TABLETS BY MOUTH EVERY MORNING AND TAKE TWO TABLETS BY MOUTH AT BEDTIME   cetirizine (ZYRTEC) 10 MG  tablet Take 10 mg by mouth daily.   chlorthalidone (HYGROTON) 25 MG tablet Take 1 tablet (25 mg total) by mouth daily. (Patient taking differently: Take 25 mg by mouth every evening.)   Cholecalciferol (NAT-RUL VITAMIN D) 25 MCG (1000 UT) tablet Take 4 tablets (4,000 Units total) by mouth daily. (Patient taking differently: Take 200 Units by mouth daily.)   cloNIDine (CATAPRES) 0.2 MG tablet Take 1 tablet (0.2 mg total) by mouth 2 (two) times daily.   fluticasone (FLONASE) 50 MCG/ACT nasal spray Place 1-2 sprays into both nostrils daily.   Garlic [id number] MG CAPS Take 300 mg by mouth daily.   hydrALAZINE (APRESOLINE) 100 MG tablet TAKE ONE TABLET BY MOUTH THREE TIMES A DAY   lamoTRIgine (LAMICTAL) 200 MG tablet Take 1 tablet (200 mg total) by mouth 2 (two) times daily.   lamoTRIgine (LAMICTAL) 5 MG CHEW chewable tablet lamoTRIgine   latanoprost (XALATAN) 0.005 % ophthalmic solution Place 1 drop into both eyes at bedtime.   losartan (COZAAR) 100 MG tablet Take 1 tablet (100 mg total) by mouth daily. (Patient taking differently: Take 100 mg by mouth in the morning.)   metoprolol tartrate (LOPRESSOR) 50 MG tablet Take 1 tablet (50 mg total) by mouth 2 (two) times daily.   montelukast (SINGULAIR) 10 MG tablet Take 10 mg by mouth in the morning.   Omega-3 Fatty Acids (FISH OIL) 1000 MG CAPS Take 3,000 mg by mouth daily.   omeprazole (PRILOSEC) 40 MG capsule Take 40 mg by mouth in the morning.   ondansetron (ZOFRAN) 8 MG tablet Take 1 tablet (8 mg total)  by mouth every 8 (eight) hours as needed for nausea or vomiting.   potassium gluconate 595 (99 K) MG TABS tablet Take 1,190 mg by mouth 2 (two) times daily.   sertraline (ZOLOFT) 100 MG tablet Take 1 tablet (100 mg total) by mouth daily.   vitamin B-12 (CYANOCOBALAMIN) 1000 MCG tablet Take 1,000 mcg by mouth in the morning.   No facility-administered medications prior to visit.    Review of Systems  Last CBC Lab Results  Component Value Date   WBC  6.4 09/24/2022   HGB 13.7 09/24/2022   HCT 39.7 09/24/2022   MCV 90 09/24/2022   MCH 30.9 09/24/2022   RDW 13.2 09/24/2022   PLT 208 [id number]   Last metabolic panel Lab Results  Component Value Date   GLUCOSE 94 09/24/2022   NA 133 (L) 09/24/2022   K 3.6 09/24/2022   CL 93 (L) 09/24/2022   CO2 26 09/24/2022   BUN 15 09/24/2022   CREATININE 1.00 09/24/2022   EGFR 84 09/24/2022   CALCIUM 9.2 09/24/2022   PHOS 2.7 09/06/2022   PROT 6.5 09/24/2022   ALBUMIN 3.8 (L) 09/24/2022   LABGLOB 2.7 09/24/2022   AGRATIO 1.4 09/24/2022   BILITOT 0.5 09/24/2022   ALKPHOS 104 09/24/2022   AST 27 09/24/2022   ALT 19 09/24/2022   ANIONGAP 9 09/06/2022   Last lipids Lab Results  Component Value Date   CHOL 155 09/24/2022   HDL 82 09/24/2022   LDLCALC 52 09/24/2022   TRIG 123 09/24/2022   CHOLHDL 1.9 09/24/2022   Last hemoglobin A1c Lab Results  Component Value Date   HGBA1C 5.2 09/24/2022   Last thyroid functions Lab Results  Component Value Date   TSH 3.440 05/31/2022   Last vitamin D Lab Results  Component Value Date   VD25OH 35.1 09/24/2022   Last vitamin B12 and Folate Lab Results  Component Value Date   VITAMINB12 702 05/31/2022   FOLATE >20.0 05/31/2022       Objective    BP 117/69 (BP Location: Right Arm, Patient Position: Sitting, Cuff Size: Large)   Pulse 69   Temp 97.9 F (36.6 C)   Wt 299 lb (135.6 kg)   SpO2 95%   BMI 46.83 kg/m  BP Readings from Last 3 Encounters:  09/24/22 117/69  09/21/22 (!) 156/77  09/15/22 (!) 166/104   Wt Readings from Last 3 Encounters:  09/24/22 299 lb (135.6 kg)  09/15/22 298 lb (135.2 kg)  09/09/22 300 lb (136.1 kg)   SpO2 Readings from Last 3 Encounters:  09/24/22 95%  09/21/22 94%  09/15/22 97%      Physical Exam Vitals and nursing note reviewed.  Constitutional:      Appearance: Normal appearance. He is obese.  HENT:     Head: Normocephalic and atraumatic.  Eyes:     Pupils: Pupils are equal,  round, and reactive to light.  Cardiovascular:     Rate and Rhythm: Normal rate and regular rhythm.     Pulses: Normal pulses.     Heart sounds: Normal heart sounds.  Pulmonary:     Effort: Pulmonary effort is normal.     Breath sounds: Normal breath sounds.  Musculoskeletal:        General: Normal range of motion.     Cervical back: Normal range of motion.  Skin:    General: Skin is warm and dry.     Capillary Refill: Capillary refill takes less than 2 seconds.  Neurological:  General: No focal deficit present.     Mental Status: He is alert and oriented to person, place, and time. Mental status is at baseline.  Psychiatric:        Mood and Affect: Mood normal.        Behavior: Behavior normal.        Thought Content: Thought content normal.        Judgment: Judgment normal.     Results for orders placed or performed in visit on 09/24/22  Hemoglobin A1c  Result Value Ref Range   Hgb A1c MFr Bld 5.2 4.8 - 5.6 %   Est. average glucose Bld gHb Est-mCnc 103 mg/dL  PSA  Result Value Ref Range   Prostate Specific Ag, Serum 1.6 0.0 - 4.0 ng/mL  CBC with Differential/Platelet  Result Value Ref Range   WBC 6.4 3.4 - 10.8 x10E3/uL   RBC 4.43 4.14 - 5.80 x10E6/uL   Hemoglobin 13.7 13.0 - 17.7 g/dL   Hematocrit 39.7 37.5 - 51.0 %   MCV 90 79 - 97 fL   MCH 30.9 26.6 - 33.0 pg   MCHC 34.5 31.5 - 35.7 g/dL   RDW 13.2 11.6 - 15.4 %   Platelets 208 150 - 450 x10E3/uL   Neutrophils 59 Not Estab. %   Lymphs 26 Not Estab. %   Monocytes 10 Not Estab. %   Eos 3 Not Estab. %   Basos 1 Not Estab. %   Neutrophils Absolute 3.8 1.4 - 7.0 x10E3/uL   Lymphocytes Absolute 1.7 0.7 - 3.1 x10E3/uL   Monocytes Absolute 0.6 0.1 - 0.9 x10E3/uL   EOS (ABSOLUTE) 0.2 0.0 - 0.4 x10E3/uL   Basophils Absolute 0.0 0.0 - 0.2 x10E3/uL   Immature Granulocytes 1 Not Estab. %   Immature Grans (Abs) 0.0 0.0 - 0.1 x10E3/uL  Comprehensive Metabolic Panel (CMET)  Result Value Ref Range   Glucose 94 70 - 99  mg/dL   BUN 15 8 - 27 mg/dL   Creatinine, Ser 1.00 0.76 - 1.27 mg/dL   eGFR 84 >59 mL/min/1.73   BUN/Creatinine Ratio 15 10 - 24   Sodium 133 (L) 134 - 144 mmol/L   Potassium 3.6 3.5 - 5.2 mmol/L   Chloride 93 (L) 96 - 106 mmol/L   CO2 26 20 - 29 mmol/L   Calcium 9.2 8.6 - 10.2 mg/dL   Total Protein 6.5 6.0 - 8.5 g/dL   Albumin 3.8 (L) 3.9 - 4.9 g/dL   Globulin, Total 2.7 1.5 - 4.5 g/dL   Albumin/Globulin Ratio 1.4 1.2 - 2.2   Bilirubin Total 0.5 0.0 - 1.2 mg/dL   Alkaline Phosphatase 104 44 - 121 IU/L   AST 27 0 - 40 IU/L   ALT 19 0 - 44 IU/L  Lipid panel  Result Value Ref Range   Cholesterol, Total 155 100 - 199 mg/dL   Triglycerides 123 0 - 149 mg/dL   HDL 82 >39 mg/dL   VLDL Cholesterol Cal 21 5 - 40 mg/dL   LDL Chol Calc (NIH) 52 0 - 99 mg/dL   Chol/HDL Ratio 1.9 0.0 - 5.0 ratio  Vitamin D (25 hydroxy)  Result Value Ref Range   Vit D, 25-Hydroxy 35.1 30.0 - 100.0 ng/mL    Assessment & Plan     Problem List Items Addressed This Visit       Cardiovascular and Mediastinum   Essential (primary) hypertension    Chronic, stable Reports syncope and collapse at work; unclear of cause Previously BP  maintained on norvasc 10, chlorthalidone 25, clonidine 0.2 bid, hydral 100 tid, cozaar 100, metop 50 bid Recommend referral to cardiology      Relevant Orders   CBC with Differential/Platelet (Completed)   Comprehensive Metabolic Panel (CMET) (Completed)   Lipid panel (Completed)     Respiratory   RESOLVED: Chronic bronchitis, unspecified chronic bronchitis type ([place])    Chronic,        Nervous and Auditory   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 Continue to recommend balanced, lower carb meals. Smaller meal size, adding snacks. Choosing water as drink of choice and increasing purposeful exercise. Discussed importance of healthy weight management Discussed diet and exercise       Frequent falls    Reports 3 falls in 6 weeks; unknown causes      Relevant Orders   CBC with Differential/Platelet (Completed)   Comprehensive Metabolic Panel (CMET) (Completed)   Hospital discharge follow-up    Dissolvable sutures to forehead; site with minor ecchymosis       Hypercholesterolemia without hypertriglyceridemia    Chronic, previously elevated Receck LP given m obesity recommend diet low in saturated fat and regular exercise - 30 min at least 5 times per week       Relevant Orders   Lipid panel (Completed)   Hypokalemia    On daily supplement; pt reports taking 1190 mg BID given previous hypokalemia; recommend follow up with labs and referral to cards given concern for electrolyte derangement       Relevant Orders   Comprehensive Metabolic Panel (CMET) (Completed)   Nocturia   Relevant Orders   PSA (Completed)   Screening for diabetes mellitus   Relevant Orders   Hemoglobin A1c (Completed)   Syncope and collapse - Primary    Patient with no memory of injury; unwitnessed Seen in ER for sutures of scalp/forehead      Return in about 4 weeks (around 10/22/2022) for annual examination.     Vonna Kotyk, FNP, have reviewed all documentation for this visit. The documentation on 09/26/22 for the exam, diagnosis, procedures, and orders are all accurate and complete.  [name], [place] [phone] (phone) [phone] (fax)  [place]

## 2022-09-24 ENCOUNTER — Ambulatory Visit (INDEPENDENT_AMBULATORY_CARE_PROVIDER_SITE_OTHER): Payer: 59 | Admitting: Family Medicine

## 2022-09-24 VITALS — BP 117/69 | HR 69 | Temp 97.9°F | Wt 299.0 lb

## 2022-09-24 DIAGNOSIS — Z09 Encounter for follow-up examination after completed treatment for conditions other than malignant neoplasm: Secondary | ICD-10-CM

## 2022-09-24 DIAGNOSIS — E7800 Pure hypercholesterolemia, unspecified: Secondary | ICD-10-CM

## 2022-09-24 DIAGNOSIS — R296 Repeated falls: Secondary | ICD-10-CM | POA: Diagnosis not present

## 2022-09-24 DIAGNOSIS — Z6841 Body Mass Index (BMI) 40.0 and over, adult: Secondary | ICD-10-CM

## 2022-09-24 DIAGNOSIS — E559 Vitamin D deficiency, unspecified: Secondary | ICD-10-CM | POA: Diagnosis not present

## 2022-09-24 DIAGNOSIS — E876 Hypokalemia: Secondary | ICD-10-CM

## 2022-09-24 DIAGNOSIS — I10 Essential (primary) hypertension: Secondary | ICD-10-CM

## 2022-09-24 DIAGNOSIS — J42 Unspecified chronic bronchitis: Secondary | ICD-10-CM

## 2022-09-24 DIAGNOSIS — R351 Nocturia: Secondary | ICD-10-CM | POA: Diagnosis not present

## 2022-09-24 DIAGNOSIS — Z131 Encounter for screening for diabetes mellitus: Secondary | ICD-10-CM

## 2022-09-24 DIAGNOSIS — R55 Syncope and collapse: Secondary | ICD-10-CM

## 2022-09-24 DIAGNOSIS — G319 Degenerative disease of nervous system, unspecified: Secondary | ICD-10-CM

## 2022-09-25 LAB — LIPID PANEL
Chol/HDL Ratio: 1.9 ratio (ref 0.0–5.0)
Cholesterol, Total: 155 mg/dL (ref 100–199)
HDL: 82 mg/dL (ref >39)
LDL Chol Calc (NIH): 52 mg/dL (ref 0–99)
Triglycerides: 123 mg/dL (ref 0–149)
VLDL Cholesterol Cal: 21 mg/dL (ref 5–40)

## 2022-09-25 LAB — COMPREHENSIVE METABOLIC PANEL
ALT: 19 IU/L (ref 0–44)
AST: 27 IU/L (ref 0–40)
Albumin/Globulin Ratio: 1.4 (ref 1.2–2.2)
Albumin: 3.8 g/dL — ABNORMAL LOW (ref 3.9–4.9)
Alkaline Phosphatase: 104 IU/L (ref 44–121)
BUN/Creatinine Ratio: 15 (ref 10–24)
BUN: 15 mg/dL (ref 8–27)
Bilirubin Total: 0.5 mg/dL (ref 0.0–1.2)
CO2: 26 mmol/L (ref 20–29)
Calcium: 9.2 mg/dL (ref 8.6–10.2)
Chloride: 93 mmol/L — ABNORMAL LOW (ref 96–106)
Creatinine, Ser: 1 mg/dL (ref 0.76–1.27)
Globulin, Total: 2.7 g/dL (ref 1.5–4.5)
Glucose: 94 mg/dL (ref 70–99)
Potassium: 3.6 mmol/L (ref 3.5–5.2)
Sodium: 133 mmol/L — ABNORMAL LOW (ref 134–144)
Total Protein: 6.5 g/dL (ref 6.0–8.5)
eGFR: 84 mL/min/{1.73_m2} (ref >59)

## 2022-09-25 LAB — CBC WITH DIFFERENTIAL/PLATELET
Basophils Absolute: 0 10*3/uL (ref 0.0–0.2)
Basos: 1 %
EOS (ABSOLUTE): 0.2 10*3/uL (ref 0.0–0.4)
Eos: 3 %
Hematocrit: 39.7 % (ref 37.5–51.0)
Hemoglobin: 13.7 g/dL (ref 13.0–17.7)
Immature Grans (Abs): 0 10*3/uL (ref 0.0–0.1)
Immature Granulocytes: 1 %
Lymphocytes Absolute: 1.7 10*3/uL (ref 0.7–3.1)
Lymphs: 26 %
MCH: 30.9 pg (ref 26.6–33.0)
MCHC: 34.5 g/dL (ref 31.5–35.7)
MCV: 90 fL (ref 79–97)
Monocytes Absolute: 0.6 10*3/uL (ref 0.1–0.9)
Monocytes: 10 %
Neutrophils Absolute: 3.8 10*3/uL (ref 1.4–7.0)
Neutrophils: 59 %
Platelets: 208 10*3/uL (ref 150–450)
RBC: 4.43 x10E6/uL (ref 4.14–5.80)
RDW: 13.2 % (ref 11.6–15.4)
WBC: 6.4 10*3/uL (ref 3.4–10.8)

## 2022-09-25 LAB — VITAMIN D 25 HYDROXY (VIT D DEFICIENCY, FRACTURES): Vit D, 25-Hydroxy: 35.1 ng/mL (ref 30.0–100.0)

## 2022-09-25 LAB — PSA: Prostate Specific Ag, Serum: 1.6 ng/mL (ref 0.0–4.0)

## 2022-09-25 LAB — HEMOGLOBIN A1C
Est. average glucose Bld gHb Est-mCnc: 103 mg/dL
Hgb A1c MFr Bld: 5.2 % (ref 4.8–5.6)

## 2022-09-26 ENCOUNTER — Encounter: Payer: Self-pay | Admitting: Family Medicine

## 2022-09-26 DIAGNOSIS — J42 Unspecified chronic bronchitis: Secondary | ICD-10-CM | POA: Insufficient documentation

## 2022-09-26 DIAGNOSIS — R351 Nocturia: Secondary | ICD-10-CM | POA: Insufficient documentation

## 2022-09-26 DIAGNOSIS — R55 Syncope and collapse: Secondary | ICD-10-CM | POA: Insufficient documentation

## 2022-09-26 DIAGNOSIS — Z131 Encounter for screening for diabetes mellitus: Secondary | ICD-10-CM | POA: Insufficient documentation

## 2022-09-26 NOTE — Assessment & Plan Note (Signed)
On daily supplement; pt reports taking 1190 mg BID given previous hypokalemia; recommend follow up with labs and referral to cards given concern for electrolyte derangement

## 2022-09-26 NOTE — Assessment & Plan Note (Signed)
Noted on CT following collapse; referral to neurology for additional assistance given frequent falls and abnormal electrolytes  Repeat labs

## 2022-09-26 NOTE — Assessment & Plan Note (Signed)
Patient with no memory of injury; unwitnessed Seen in ER for sutures of scalp/forehead

## 2022-09-26 NOTE — Assessment & Plan Note (Signed)
Chronic,

## 2022-09-26 NOTE — Assessment & Plan Note (Signed)
Reports 3 falls in 6 weeks; unknown causes

## 2022-09-26 NOTE — Assessment & Plan Note (Signed)
Chronic, previously elevated Receck LP given m obesity recommend diet low in saturated fat and regular exercise - 30 min at least 5 times per week

## 2022-09-26 NOTE — Assessment & Plan Note (Signed)
Chronic, stable Body mass index is 46.83 kg/m. Continue to recommend balanced, lower carb meals. Smaller meal size, adding snacks. Choosing water as drink of choice and increasing purposeful exercise. Discussed importance of healthy weight management Discussed diet and exercise

## 2022-09-26 NOTE — Assessment & Plan Note (Signed)
Chronic, stable Reports syncope and collapse at work; unclear of cause Previously BP maintained on norvasc 10, chlorthalidone 25, clonidine 0.2 bid, hydral 100 tid, cozaar 100, metop 50 bid Recommend referral to cardiology

## 2022-09-26 NOTE — Progress Notes (Signed)
Labs show hyponatremia and hypochloremia. Continue to recommend consult with neurology. Otherwise, stable.

## 2022-09-26 NOTE — Assessment & Plan Note (Signed)
Chronic, previously low Recommend additional labs at this time to guide supplementation

## 2022-09-26 NOTE — Assessment & Plan Note (Signed)
Dissolvable sutures to forehead; site with minor ecchymosis

## 2022-09-28 ENCOUNTER — Telehealth: Payer: Self-pay

## 2022-09-28 NOTE — Addendum Note (Signed)
Addended by: Tally Joe T on: 09/28/2022 03:37 PM   Modules accepted: Orders

## 2022-09-28 NOTE — Telephone Encounter (Signed)
Copied from [place] [id number]. Topic: Referral - Request for Referral >> Sep 28, 2022 11:41 AM Rosanne Ashing P wrote: Pt called saying they were in last week and fell on the head.  He was to be referred to neurology but has not heard anything.  Please advise  [phone]

## 2022-09-30 ENCOUNTER — Encounter: Payer: Self-pay | Admitting: Gastroenterology

## 2022-10-01 ENCOUNTER — Ambulatory Visit: Payer: 59 | Admitting: Certified Registered"

## 2022-10-01 ENCOUNTER — Encounter
Admission: RE | Disposition: A | Discharge status: Home / Self Care | Payer: Self-pay | Source: Home / Self Care | Attending: Gastroenterology

## 2022-10-01 ENCOUNTER — Ambulatory Visit
Admission: RE | Admit: 2022-10-01 | Discharge: 2022-10-01 | Disposition: A | Discharge status: Home / Self Care | Payer: 59 | Attending: Gastroenterology | Admitting: Gastroenterology

## 2022-10-01 DIAGNOSIS — R1013 Epigastric pain: Secondary | ICD-10-CM | POA: Diagnosis not present

## 2022-10-01 DIAGNOSIS — K3189 Other diseases of stomach and duodenum: Secondary | ICD-10-CM | POA: Insufficient documentation

## 2022-10-01 DIAGNOSIS — F319 Bipolar disorder, unspecified: Secondary | ICD-10-CM | POA: Diagnosis not present

## 2022-10-01 DIAGNOSIS — I10 Essential (primary) hypertension: Secondary | ICD-10-CM | POA: Insufficient documentation

## 2022-10-01 DIAGNOSIS — K2970 Gastritis, unspecified, without bleeding: Secondary | ICD-10-CM | POA: Diagnosis not present

## 2022-10-01 DIAGNOSIS — K259 Gastric ulcer, unspecified as acute or chronic, without hemorrhage or perforation: Secondary | ICD-10-CM | POA: Diagnosis not present

## 2022-10-01 DIAGNOSIS — K219 Gastro-esophageal reflux disease without esophagitis: Secondary | ICD-10-CM | POA: Diagnosis not present

## 2022-10-01 DIAGNOSIS — R112 Nausea with vomiting, unspecified: Secondary | ICD-10-CM | POA: Diagnosis not present

## 2022-10-01 DIAGNOSIS — K264 Chronic or unspecified duodenal ulcer with hemorrhage: Secondary | ICD-10-CM | POA: Insufficient documentation

## 2022-10-01 DIAGNOSIS — Z6841 Body Mass Index (BMI) 40.0 and over, adult: Secondary | ICD-10-CM | POA: Insufficient documentation

## 2022-10-01 DIAGNOSIS — Q438 Other specified congenital malformations of intestine: Secondary | ICD-10-CM | POA: Diagnosis not present

## 2022-10-01 DIAGNOSIS — K317 Polyp of stomach and duodenum: Secondary | ICD-10-CM | POA: Diagnosis not present

## 2022-10-01 DIAGNOSIS — K6389 Other specified diseases of intestine: Secondary | ICD-10-CM | POA: Insufficient documentation

## 2022-10-01 DIAGNOSIS — K254 Chronic or unspecified gastric ulcer with hemorrhage: Secondary | ICD-10-CM | POA: Diagnosis not present

## 2022-10-01 DIAGNOSIS — K269 Duodenal ulcer, unspecified as acute or chronic, without hemorrhage or perforation: Secondary | ICD-10-CM

## 2022-10-01 DIAGNOSIS — K279 Peptic ulcer, site unspecified, unspecified as acute or chronic, without hemorrhage or perforation: Secondary | ICD-10-CM | POA: Diagnosis not present

## 2022-10-01 DIAGNOSIS — D132 Benign neoplasm of duodenum: Secondary | ICD-10-CM | POA: Diagnosis not present

## 2022-10-01 DIAGNOSIS — Z1211 Encounter for screening for malignant neoplasm of colon: Secondary | ICD-10-CM

## 2022-10-01 DIAGNOSIS — G4730 Sleep apnea, unspecified: Secondary | ICD-10-CM | POA: Diagnosis not present

## 2022-10-01 DIAGNOSIS — K635 Polyp of colon: Secondary | ICD-10-CM | POA: Diagnosis not present

## 2022-10-01 DIAGNOSIS — R1010 Upper abdominal pain, unspecified: Secondary | ICD-10-CM | POA: Diagnosis not present

## 2022-10-01 HISTORY — PX: ESOPHAGOGASTRODUODENOSCOPY (EGD) WITH PROPOFOL: SHX5813

## 2022-10-01 HISTORY — PX: COLONOSCOPY WITH PROPOFOL: SHX5780

## 2022-10-01 SURGERY — ESOPHAGOGASTRODUODENOSCOPY (EGD) WITH PROPOFOL
Anesthesia: General

## 2022-10-01 MED ORDER — MIDAZOLAM HCL 5 MG/5ML IJ SOLN
INTRAMUSCULAR | Status: DC | PRN
  Administered 2022-10-01: 2 mg via INTRAVENOUS

## 2022-10-01 MED ORDER — PROPOFOL 500 MG/50ML IV EMUL
INTRAVENOUS | Status: DC | PRN
  Administered 2022-10-01: 120 ug/kg/min via INTRAVENOUS

## 2022-10-01 MED ORDER — GLYCOPYRROLATE 0.2 MG/ML IJ SOLN
INTRAMUSCULAR | Status: AC
  Filled 2022-10-01: qty 1

## 2022-10-01 MED ORDER — SODIUM CHLORIDE 0.9 % IV SOLN
INTRAVENOUS | Status: DC
  Administered 2022-10-01: 20 mL/h via INTRAVENOUS

## 2022-10-01 MED ORDER — GLYCOPYRROLATE 0.2 MG/ML IJ SOLN
INTRAMUSCULAR | Status: DC | PRN
Start: 2022-10-01 — End: 2022-10-01
  Administered 2022-10-01: .2 mg via INTRAVENOUS

## 2022-10-01 MED ORDER — MIDAZOLAM HCL 2 MG/2ML IJ SOLN
INTRAMUSCULAR | Status: AC
  Filled 2022-10-01: qty 2

## 2022-10-01 MED ORDER — DEXMEDETOMIDINE HCL IN NACL 200 MCG/50ML IV SOLN
INTRAVENOUS | Status: DC | PRN
  Administered 2022-10-01: 20 ug via INTRAVENOUS

## 2022-10-01 MED ORDER — PROPOFOL 10 MG/ML IV BOLUS
INTRAVENOUS | Status: DC | PRN
  Administered 2022-10-01: 100 mg via INTRAVENOUS

## 2022-10-01 MED ORDER — PROPOFOL 10 MG/ML IV BOLUS
INTRAVENOUS | Status: AC
  Filled 2022-10-01: qty 40

## 2022-10-01 MED ORDER — LIDOCAINE 2% (20 MG/ML) 5 ML SYRINGE
INTRAMUSCULAR | Status: DC | PRN
  Administered 2022-10-01: 20 mg via INTRAVENOUS

## 2022-10-01 MED ORDER — LIDOCAINE HCL (PF) 2 % IJ SOLN
INTRAMUSCULAR | Status: AC
  Filled 2022-10-01: qty 5

## 2022-10-01 MED ORDER — OMEPRAZOLE 40 MG PO CPDR: DELAYED_RELEASE_CAPSULE | ORAL | 0 refills | Status: DC

## 2022-10-01 NOTE — Anesthesia Postprocedure Evaluation (Signed)
Anesthesia Post Note  Patient: Peter [name]  Procedure(s) Performed: ESOPHAGOGASTRODUODENOSCOPY (EGD) WITH PROPOFOL COLONOSCOPY WITH PROPOFOL  Patient location during evaluation: Endoscopy Anesthesia Type: General Level of consciousness: awake and alert Pain management: pain level controlled Vital Signs Assessment: post-procedure vital signs reviewed and stable Respiratory status: spontaneous breathing, nonlabored ventilation, respiratory function stable and patient connected to nasal cannula oxygen Cardiovascular status: blood pressure returned to baseline and stable Postop Assessment: no apparent nausea or vomiting Anesthetic complications: no   No notable events documented.   Last Vitals:  Vitals:   10/01/22 1109 10/01/22 1119  BP: 121/63 130/78  Pulse: 67   Resp: 20   Temp:    SpO2: 98% 98%    Last Pain:  Vitals:   10/01/22 1109  TempSrc:   PainSc: 0-No pain                 Ilene Qua

## 2022-10-01 NOTE — H&P (Signed)
Peter Darby, MD [address]  [place]  [place], [place] [address]  Main: [phone]  Fax: [phone] Pager: [phone]  Primary Care Physician:  [name], MD Primary Gastroenterologist:  Dr. Cephas [name]  Pre-Procedure History & Physical: HPI:  Peter [name] is a 62 y.o. male is here for an endoscopy and colonoscopy.   Past Medical History:  Diagnosis Date   Bipolar disorder ([place])    Blood in stool    Difficult intubation    Dyspnea    GERD (gastroesophageal reflux disease)    Glaucoma    since 2004   Hypertension    since 2000   Obesity, unspecified    Sleep apnea    Ulcer 2011   gastric   Unspecified hemorrhoids without mention of complication     Past Surgical History:  Procedure Laterality Date   APPENDECTOMY     APPLICATION OF WOUND VAC N/A 11/16/2016   Procedure: APPLICATION OF WOUND VAC-ABDOMINAL;  Surgeon: Florene Glen, MD;  Location: [facility];  Service: General;  Laterality: N/A;   APPLICATION OF WOUND VAC N/A 11/18/2016   Procedure: APPLICATION OF WOUND VAC;  Surgeon: Florene Glen, MD;  Location: [facility];  Service: General;  Laterality: N/A;  change   APPLICATION OF WOUND VAC N/A 11/23/2016   Procedure: WOUND VAC CHANGE;  Surgeon: Olean Ree, MD;  Location: [facility];  Service: General;  Laterality: N/A;  wound vac application   CARDIAC CATHETERIZATION Left 06/23/2016   Procedure: Left Heart Cath and Coronary Angiography;  Surgeon: Nelva Bush, MD;  Location: [facility];  Service: Cardiovascular;  Laterality: Left;   CHOLECYSTECTOMY  07/05/1990   COLONOSCOPY W/ POLYPECTOMY  05/22/2010   13mmtransverse colon polyp, traditional serrated adenoma,negative for high grade dysplasia & malignancy. rectal polyp-106mm,negative for dysplasia & malignancy.   COLONOSCOPY WITH PROPOFOL N/A 06/20/2019   Procedure: COLONOSCOPY WITH PROPOFOL;  Surgeon: Lin Landsman, MD;  Location: [facility] ENDOSCOPY;  Service:  Gastroenterology;  Laterality: N/A;   COLONOSCOPY WITH PROPOFOL N/A 06/21/2019   Procedure: COLONOSCOPY WITH PROPOFOL;  Surgeon: Lin Landsman, MD;  Location: [facility] END[facility]COPY;  Service: Gastroenterology;  Laterality: N/A;   DRESSING CHANGE UNDER ANESTHESIA N/A 11/21/2016   Procedure: DRESSING CHANGE UNDER ANESTHESIA;  Surgeon: Florene Glen, MD;  Location: [facility];  Service: General;  Laterality: N/A;   ESOPHAGOGASTRODUODENOSCOPY (EGD) WITH PROPOFOL N/A 06/20/2019   Procedure: ESOPHAGOGASTRODUODENOSCOPY (EGD) WITH PROPOFOL;  Surgeon: Lin Landsman, MD;  Location: [facility] ENDOS[facility];  Service: Gastroenterology;  Laterality: N/A;   KNEE SURGERY Left 07/05/2008   LAPAROSCOPIC APPENDECTOMY N/A 10/30/2016   Procedure: APPENDECTOMY LAPAROSCOPIC changed  to open application of wound vac;  Surgeon: [name], MD;  Location: [facility];  Service: General;  Laterality: N/A;   LAPAROTOMY N/A 11/11/2016   Procedure: EXPLORATORY LAPAROTOMY, DEBRIDEMENT OF ABDOMINAL WOUND, ABDOMINAL [facility];  Surgeon: [name], MD;  Location: [facility];  Service: General;  Laterality: N/A;   LAPAROTOMY N/A 11/12/2016   Procedure: EXPLORATORY LAPAROTOMY;  Surgeon: Clayburn Pert, MD;  Location: [facility];  Service: General;  Laterality: N/A;   LAPAROTOMY N/A 11/14/2016   Procedure: EXPLORATORY LAPAROTOMY; Irrigation, partial closure;  Surgeon: Clayburn Pert, MD;  Location: [facility];  Service: General;  Laterality: N/A;   TOTAL KNEE ARTHROPLASTY Left 03/21/2019   Procedure: TOTAL KNEE ARTHROPLASTY;  Surgeon: Lovell Sheehan, MD;  Location: [facility];  Service: Orthopedics;  Laterality: Left;    Prior to Admission medications  Medication Sig Start Date End Date Taking? Authorizing Provider  amLODipine (NORVASC) 10 MG tablet Take 1 tablet (10 mg total) by mouth daily. Patient taking differently: Take 10 mg by mouth in the morning. 07/22/22  Yes Simmons-Robinson, Makiera, MD  aspirin (ASPIRIN 81) 81 MG  chewable tablet Aspir-81   Yes [provider]  azelastine (ASTELIN) 0.1 % nasal spray Place 1-2 sprays into both nostrils 2 (two) times daily.   Yes [provider]  carbamazepine (TEGRETOL) 200 MG tablet TAKE THREE TABLETS BY MOUTH EVERY MORNING AND TAKE TWO TABLETS BY MOUTH AT BEDTIME 09/10/22  Yes Hurst, Teresa T, PA-C  cetirizine (ZYRTEC) 10 MG tablet Take 10 mg by mouth daily.   Yes [provider]  chlorthalidone (HYGROTON) 25 MG tablet Take 1 tablet (25 mg total) by mouth daily. Patient taking differently: Take 25 mg by mouth every evening. 07/30/22  Yes Simmons-Robinson, Makiera, MD  Cholecalciferol (NAT-RUL VITAMIN D) 25 MCG (1000 UT) tablet Take 4 tablets (4,000 Units total) by mouth daily. Patient taking differently: Take 200 Units by mouth daily. 07/23/22  Yes Simmons-Robinson, Makiera, MD  cloNIDine (CATAPRES) 0.2 MG tablet Take 1 tablet (0.2 mg total) by mouth 2 (two) times daily. 05/24/22  Yes Simmons-Robinson, Makiera, MD  fluticasone (FLONASE) 50 MCG/ACT nasal spray Place 1-2 sprays into both nostrils daily.   Yes [provider]  Garlic [id number] MG CAPS Take 300 mg by mouth daily.   Yes [provider]  hydrALAZINE (APRESOLINE) 100 MG tablet TAKE ONE TABLET BY MOUTH THREE TIMES A DAY 04/26/22  Yes Drubel, Ria Comment, PA-C  lamoTRIgine (LAMICTAL) 200 MG tablet Take 1 tablet (200 mg total) by mouth 2 (two) times daily. 09/10/22  Yes Hurst, Helene Kelp T, PA-C  latanoprost (XALATAN) 0.005 % ophthalmic solution Place 1 drop into both eyes at bedtime.   Yes [provider]  losartan (COZAAR) 100 MG tablet Take 1 tablet (100 mg total) by mouth daily. Patient taking differently: Take 100 mg by mouth in the morning. 06/03/22  Yes Simmons-Robinson, Makiera, MD  metoprolol tartrate (LOPRESSOR) 50 MG tablet Take 1 tablet (50 mg total) by mouth 2 (two) times daily. 07/22/22  Yes Simmons-Robinson, Makiera, MD  montelukast (SINGULAIR) 10 MG tablet Take 10 mg by  mouth in the morning.   Yes [provider]  Omega-3 Fatty Acids (FISH OIL) 1000 MG CAPS Take 3,000 mg by mouth daily.   Yes [provider]  omeprazole (PRILOSEC) 40 MG capsule Take 40 mg by mouth in the morning.   Yes [provider]  ondansetron (ZOFRAN) 8 MG tablet Take 1 tablet (8 mg total) by mouth every 8 (eight) hours as needed for nausea or vomiting. 09/07/22  Yes Simmons-Robinson, Makiera, MD  potassium gluconate 595 (99 K) MG TABS tablet Take 1,190 mg by mouth 2 (two) times daily.   Yes [provider]  sertraline (ZOLOFT) 100 MG tablet Take 1 tablet (100 mg total) by mouth daily. 05/24/22  Yes Simmons-Robinson, Makiera, MD  vitamin B-12 (CYANOCOBALAMIN) 1000 MCG tablet Take 1,000 mcg by mouth in the morning.   Yes [provider]  aspirin EC 81 MG tablet Take 81 mg by mouth in the morning. Patient not taking: Reported on 10/01/2022    [provider]  atorvastatin (LIPITOR) 40 MG tablet Take 1 tablet (40 mg total) by mouth daily. Patient taking differently: Take 40 mg by mouth every evening. 07/22/22   Simmons-Robinson, Riki Sheer, MD  lamoTRIgine (LAMICTAL) 5 MG CHEW chewable tablet lamoTRIgine  [provider]    Allergies as of 09/09/2022 - Review Complete 09/09/2022  Allergen Reaction Noted   Oxycodone Other (See Comments) 11/27/2016   Isosorbide Nausea And Vomiting 07/28/2016    Family History  Problem Relation Age of Onset   Cancer Other        colon   Hyperlipidemia Mother    Hypertension Mother     Social History   Socioeconomic History   Marital status: Married    Spouse name: Not on file   Number of children: Not on file   Years of education: Not on file   Highest education level: Associate degree: occupational, Hotel manager, or vocational program  Occupational History   Not on file  Tobacco Use   Smoking status: Never   Smokeless tobacco: Never  Vaping Use   Vaping Use: Never used  Substance and  Sexual Activity   Alcohol use: Not Currently    Comment: occasionally   Drug use: No   Sexual activity: Not on file  Other Topics Concern   Not on file  Social History Narrative   Not on file   Social Determinants of Health   Financial Resource Strain: [place]  (09/24/2022)   Overall Financial Resource Strain (CARDIA)    Difficulty of Paying Living Expenses: Not very hard  Food Insecurity: No Food Insecurity (09/24/2022)   Hunger Vital Sign    Worried About Running Out of Food in the Last Year: Never true    Ran Out of Food in the Last Year: Never true  Transportation Needs: No Transportation Needs (09/24/2022)   PRAPARE - Hydrologist (Medical): No    Lack of Transportation (Non-Medical): No  Physical Activity: Insufficiently Active (09/24/2022)   Exercise Vital Sign    Days of Exercise per Week: 1 day    Minutes of Exercise per Session: 30 min  Stress: Stress Concern Present (09/24/2022)   [place]    Feeling of Stress : To some extent  Social Connections: Moderately Isolated (09/24/2022)   Social Connection and Isolation Panel [NHANES]    Frequency of Communication with Friends and Family: Once a week    Frequency of Social Gatherings with Friends and Family: Once a week    Attends Religious Services: More than 4 times per year    Active Member of [employer] or Organizations: No    Attends Archivist Meetings: Not on file    Marital Status: Married  Human resources officer Violence: Not At Risk (08/18/2022)   Humiliation, Afraid, Rape, and Kick questionnaire    Fear of Current or Ex-Partner: No    Emotionally Abused: No    Physically Abused: No    Sexually Abused: No    Review of Systems: See HPI, otherwise negative ROS  Physical Exam: BP (!) 162/86   Pulse (!) 56   Resp 20   Ht 5\' 7"  (1.702 m)   Wt 134.5 kg   SpO2 97%   BMI 46.45 kg/m  General:   Alert,  pleasant and  cooperative in NAD Head:  Normocephalic and atraumatic. Neck:  Supple; no masses or thyromegaly. Lungs:  Clear throughout to auscultation.    Heart:  Regular rate and rhythm. Abdomen:  Soft, nontender and nondistended. Normal bowel sounds, without guarding, and without rebound.   Neurologic:  Alert and  oriented x4;  grossly normal neurologically.  Impression/Plan: Peter [name] is here for an endoscopy and colonoscopy to  be performed for upper abdominal pain associated with intermittent episodes of nausea and vomiting, Sessile serrated polyps <1cm in the colon, fair prep   Risks, benefits, limitations, and alternatives regarding  endoscopy and colonoscopy have been reviewed with the patient.  Questions have been answered.  All parties agreeable.   Sherri Sear, MD  10/01/2022, 10:10 AM

## 2022-10-01 NOTE — Transfer of Care (Signed)
Immediate Anesthesia Transfer of Care Note  Patient: Peter [name]  Procedure(s) Performed: ESOPHAGOGASTRODUODENOSCOPY (EGD) WITH PROPOFOL COLONOSCOPY WITH PROPOFOL  Patient Location: Endoscopy Unit  Anesthesia Type:General  Level of Consciousness: drowsy  Airway & Oxygen Therapy: Patient Spontanous Breathing  Post-op Assessment: Report given to RN and Post -op Vital signs reviewed and stable  Post vital signs: Reviewed  Last Vitals:  Vitals Value Taken Time  BP    Temp    Pulse    Resp    SpO2      Last Pain:  Vitals:   10/01/22 0921  TempSrc: Temporal  PainSc: 0-No pain         Complications: No notable events documented.

## 2022-10-01 NOTE — Op Note (Signed)
[facility] Patient Name: Peter [name] Procedure Date: 10/01/2022 10:14 AM MRN: [id number] Account #: [id number] Date of Birth: 1960/11/08 Admit Type: Outpatient Age: 62 Room: [facility] ENDO ROOM 2 Gender: Male Note Status: Finalized Instrument Name: Upper Endoscope [id number] Procedure:             Upper GI endoscopy Indications:           Dyspepsia, Nausea with vomiting Providers:             Lin Landsman MD, MD Referring MD:          No Local Md, MD (Referring MD) Medicines:             General Anesthesia Complications:         No immediate complications. Estimated blood loss: None. Procedure:             Pre-Anesthesia Assessment:                        - Prior to the procedure, a History and Physical was                         performed, and patient medications and allergies were                         reviewed. The patient is competent. The risks and                         benefits of the procedure and the sedation options and                         risks were discussed with the patient. All questions                         were answered and informed consent was obtained.                         Patient identification and proposed procedure were                         verified by the physician, the nurse, the                         anesthesiologist, the anesthetist and the technician                         in the pre-procedure area in the procedure room in the                         endoscopy suite. Mental Status Examination: alert and                         oriented. Airway Examination: normal oropharyngeal                         airway and neck mobility. Respiratory Examination:                         clear to auscultation. CV Examination: normal.  Prophylactic Antibiotics: The patient does not require                         prophylactic antibiotics. Prior Anticoagulants: The                          patient has taken no anticoagulant or antiplatelet                         agents. ASA Grade Assessment: III - A patient with                         severe systemic disease. After reviewing the risks and                         benefits, the patient was deemed in satisfactory                         condition to undergo the procedure. The anesthesia                         plan was to use general anesthesia. Immediately prior                         to administration of medications, the patient was                         re-assessed for adequacy to receive sedatives. The                         heart rate, respiratory rate, oxygen saturations,                         blood pressure, adequacy of pulmonary ventilation, and                         response to care were monitored throughout the                         procedure. The physical status of the patient was                         re-assessed after the procedure.                        After obtaining informed consent, the endoscope was                         passed under direct vision. Throughout the procedure,                         the patient's blood pressure, pulse, and oxygen                         saturations were monitored continuously. The Endoscope                         was introduced through the mouth, and advanced to the  second part of duodenum. The upper GI endoscopy was                         accomplished without difficulty. The patient tolerated                         the procedure well. Findings:      A single 10 mm nodule with a localized distribution was found in the       second portion of the duodenum. Biopsies were taken with a cold forceps       for histology. Estimated blood loss was minimal. To prevent bleeding       after the biopsy, one hemostatic clip was successfully placed (MR safe).       Clip manufacturer: [employer]. There was no bleeding at the end       of the  procedure. Estimated blood loss was minimal.      One non-bleeding linear duodenal ulcer with a clean ulcer base (Forrest       Class III) was found in the second portion of the duodenum. The lesion       was 5 mm in largest dimension.      Multiple dispersed small erosions with stigmata of recent bleeding were       found in the gastric antrum and in the prepyloric region of the stomach.       Biopsies were taken with a cold forceps for histology.      The gastric body and incisura were normal. Biopsies were taken with a       cold forceps for Helicobacter pylori testing.      The cardia and gastric fundus were normal on retroflexion.      The gastroesophageal junction and examined esophagus were normal. Impression:            - Nodule found in the duodenum. Biopsied. Clip                         manufacturer: [employer]. Clip (MR safe) was                         placed.                        - Non-bleeding duodenal ulcer with a clean ulcer base                         (Forrest Class III).                        - Erosive gastropathy with stigmata of recent                         bleeding. Biopsied.                        - Normal gastric body and incisura. Biopsied.                        - Normal gastroesophageal junction and esophagus. Recommendation:        - Await pathology results.                        -  Follow an antireflux regimen indefinitely.                        - Continue present medications.                        - Proceed with colonoscopy as scheduled                        See colonoscopy report Procedure Code(s):     --- Professional ---                        [phone], Esophagogastroduodenoscopy, flexible,                         transoral; with biopsy, single or multiple Diagnosis Code(s):     --- Professional ---                        K31.89, Other diseases of stomach and duodenum                        K26.9, Duodenal ulcer, unspecified as acute or                          chronic, without hemorrhage or perforation                        K92.2, Gastrointestinal hemorrhage, unspecified                        R10.13, Epigastric pain                        R11.2, Nausea with vomiting, unspecified CPT copyright 2022 American Medical Association. All rights reserved. The codes documented in this report are preliminary and upon coder review may  be revised to meet current compliance requirements. Dr. Ulyess Mort Lin Landsman MD, MD 10/01/2022 10:41:12 AM This report has been signed electronically. Number of Addenda: 0 Note Initiated On: 10/01/2022 10:14 AM Estimated Blood Loss:  Estimated blood loss: none.      [facility]

## 2022-10-01 NOTE — Op Note (Signed)
[facility] Patient Name: Peter [name] Procedure Date: 10/01/2022 10:11 AM MRN: [id number] Account #: [id number] Date of Birth: Mar 15, 1961 Admit Type: Outpatient Age: 62 Room: [facility] ENDO ROOM 2 Gender: Male Note Status: Finalized Instrument Name: Jasper Riling [id number] Procedure:             Colonoscopy Indications:           Surveillance: History of adenomatous polyps,                         inadequate prep on last exam (<14yr), Last colonoscopy:                         December 2020 Providers:             Lin Landsman MD, MD Referring MD:          No Local Md, MD (Referring MD) Medicines:             General Anesthesia Complications:         No immediate complications. Estimated blood loss: None. Procedure:             Pre-Anesthesia Assessment:                        - Prior to the procedure, a History and Physical was                         performed, and patient medications and allergies were                         reviewed. The patient is competent. The risks and                         benefits of the procedure and the sedation options and                         risks were discussed with the patient. All questions                         were answered and informed consent was obtained.                         Patient identification and proposed procedure were                         verified by the physician, the nurse, the                         anesthesiologist, the anesthetist and the technician                         in the pre-procedure area in the procedure room in the                         endoscopy suite. Mental Status Examination: alert and                         oriented. Airway Examination: normal oropharyngeal  airway and neck mobility. Respiratory Examination:                         clear to auscultation. CV Examination: normal.                         Prophylactic Antibiotics: The patient  does not require                         prophylactic antibiotics. Prior Anticoagulants: The                         patient has taken no anticoagulant or antiplatelet                         agents. ASA Grade Assessment: III - A patient with                         severe systemic disease. After reviewing the risks and                         benefits, the patient was deemed in satisfactory                         condition to undergo the procedure. The anesthesia                         plan was to use general anesthesia. Immediately prior                         to administration of medications, the patient was                         re-assessed for adequacy to receive sedatives. The                         heart rate, respiratory rate, oxygen saturations,                         blood pressure, adequacy of pulmonary ventilation, and                         response to care were monitored throughout the                         procedure. The physical status of the patient was                         re-assessed after the procedure.                        After obtaining informed consent, the colonoscope was                         passed under direct vision. Throughout the procedure,                         the patient's blood pressure, pulse, and oxygen  saturations were monitored continuously. The                         Colonoscope was introduced through the anus and                         advanced to the the cecum, identified by appendiceal                         orifice and ileocecal valve. The colonoscopy was                         performed with moderate difficulty due to significant                         looping and the patient's body habitus. Successful                         completion of the procedure was aided by applying                         abdominal pressure. The patient tolerated the                         procedure well. The quality of  the bowel preparation                         was evaluated using the BBPS [facility] Bowel Preparation                         Scale) with scores of: Right Colon = 3, Transverse                         Colon = 3 and Left Colon = 3 (entire mucosa seen well                         with no residual staining, small fragments of stool or                         opaque liquid). The total BBPS score equals 9. The                         ileocecal valve, appendiceal orifice, and rectum were                         photographed. Findings:      The perianal and digital rectal examinations were normal. Pertinent       negatives include normal sphincter tone and no palpable rectal lesions.      A 3 mm polyp was found in the cecum. The polyp was sessile. The polyp       was removed with a cold biopsy forceps. The polyp was removed with a       cold snare. Resection and retrieval were complete. Estimated blood loss:       none.      The colon (entire examined portion) appeared normal.      The retroflexed view of the distal rectum and anal verge was  normal and       showed no anal or rectal abnormalities. Impression:            - One 3 mm polyp in the cecum, removed with a cold                         biopsy forceps and removed with a cold snare. Resected                         and retrieved.                        - The entire examined colon is normal.                        - The distal rectum and anal verge are normal on                         retroflexion view. Recommendation:        - Discharge patient to home (with escort).                        - Resume previous diet today.                        - Continue present medications.                        - Await pathology results.                        - Repeat colonoscopy in 7-10 years with 2 day prep for                         surveillance based on pathology results. Procedure Code(s):     --- Professional ---                        [phone],  Colonoscopy, flexible; with removal of                         tumor(s), polyp(s), or other lesion(s) by snare                         technique Diagnosis Code(s):     --- Professional ---                        Z86.010, Personal history of colonic polyps                        D12.0, Benign neoplasm of cecum CPT copyright 2022 American Medical Association. All rights reserved. The codes documented in this report are preliminary and upon coder review may  be revised to meet current compliance requirements. Dr. Ulyess Mort Lin Landsman MD, MD 10/01/2022 10:59:22 AM This report has been signed electronically. Number of Addenda: 0 Note Initiated On: 10/01/2022 10:11 AM Scope Withdrawal Time: 0 hours 9 minutes 31 seconds  Total Procedure Duration: 0 hours 12 minutes 43 seconds  Estimated Blood Loss:  Estimated blood loss: none.      [facility]

## 2022-10-01 NOTE — Anesthesia Preprocedure Evaluation (Addendum)
Anesthesia Evaluation  Patient identified by MRN, date of birth, ID band Patient awake    Reviewed: Allergy & Precautions, NPO status , Patient's Chart, lab work & pertinent test results  History of Anesthesia Complications (+) DIFFICULT AIRWAY and history of anesthetic complications (prev grade 2 view with glidescope 4, Difficult Airway- due to limited oral opening, large tongue and anterior larynx)  Airway Mallampati: III  TM Distance: >3 FB Neck ROM: full    Dental  (+) Chipped   Pulmonary sleep apnea and Continuous Positive Airway Pressure Ventilation    Pulmonary exam normal        Cardiovascular hypertension, On Medications and On Home Beta Blockers Normal cardiovascular exam     Neuro/Psych  PSYCHIATRIC DISORDERS  Depression Bipolar Disorder   negative neurological ROS     GI/Hepatic Neg liver ROS,GERD  Medicated,,  Endo/Other    Morbid obesity  Renal/GU negative Renal ROS  negative genitourinary   Musculoskeletal   Abdominal   Peds  Hematology negative hematology ROS (+)   Anesthesia Other Findings Past Medical History: No date: Bipolar disorder (Ruth) No date: Blood in stool No date: Difficult intubation No date: Dyspnea No date: GERD (gastroesophageal reflux disease) No date: Glaucoma     Comment:  since 2004 No date: Hypertension     Comment:  since 2000 No date: Obesity, unspecified No date: Sleep apnea 2011: Ulcer     Comment:  gastric No date: Unspecified hemorrhoids without mention of complication  Past Surgical History: No date: APPENDECTOMY [id number]: APPLICATION OF WOUND VAC; N/A     Comment:  Procedure: APPLICATION OF WOUND VAC-ABDOMINAL;  Surgeon:              Florene Glen, MD;  Location: [facility];  Service:               General;  Laterality: N/A; [id number]: APPLICATION OF WOUND VAC; N/A     Comment:  Procedure: APPLICATION OF WOUND VAC;  Surgeon: Florene Glen, MD;  Location: [facility];  Service: General;                Laterality: N/A;  change [id number]: APPLICATION OF WOUND VAC; N/A     Comment:  Procedure: WOUND VAC CHANGE;  Surgeon: Olean Ree,               MD;  Location: [facility];  Service: General;  Laterality:               N/A;  wound vac application [id number]: CARDIAC CATHETERIZATION; Left     Comment:  Procedure: Left Heart Cath and Coronary Angiography;                Surgeon: Nelva Bush, MD;  Location: [place] [facility]              [facility];  Service: Cardiovascular;  Laterality: Left; 07/05/1990: CHOLECYSTECTOMY 05/22/2010: COLONOSCOPY W/ POLYPECTOMY     Comment:  9mmtransverse colon polyp, traditional serrated               adenoma,negative for high grade dysplasia & malignancy.               rectal polyp-58mm,negative for dysplasia & malignancy. 06/20/2019: COLONOSCOPY WITH PROPOFOL; N/A     Comment:  Procedure: COLONOSCOPY WITH PROPOFOL;  Surgeon: Sherri Sear  Reece Levy, MD;  Location: [place];  Service:               Gastroenterology;  Laterality: N/A; 06/21/2019: COLONOSCOPY WITH PROPOFOL; N/A     Comment:  Procedure: COLONOSCOPY WITH PROPOFOL;  Surgeon: Lin Landsman, MD;  Location: [facility];  Service:               Gastroenterology;  Laterality: N/A; 11/21/2016: DRESSING CHANGE UNDER ANESTHESIA; N/A     Comment:  Procedure: DRESSING CHANGE UNDER ANESTHESIA;  Surgeon:               Florene Glen, MD;  Location: [facility];  Service:               General;  Laterality: N/A; 06/20/2019: ESOPHAGOGASTRODUODENOSCOPY (EGD) WITH PROPOFOL; N/A     Comment:  Procedure: ESOPHAGOGASTRODUODENOSCOPY (EGD) WITH               PROPOFOL;  Surgeon: Lin Landsman, MD;  Location:               [facility];  Service: Gastroenterology;  Laterality:               N/A; 07/05/2008: KNEE SURGERY; Left 10/30/2016: LAPAROSCOPIC APPENDECTOMY; N/A     Comment:  Procedure:  APPENDECTOMY LAPAROSCOPIC changed  to open               application of wound vac;  Surgeon: [name], MD;                Location: [facility];  Service: General;  Laterality: N/A; 11/11/2016: LAPAROTOMY; N/A     Comment:  Procedure: EXPLORATORY LAPAROTOMY, DEBRIDEMENT OF               ABDOMINAL WOUND, ABDOMINAL [place];  Surgeon: [name], MD;  Location: [facility];  Service: General;                Laterality: N/A; 11/12/2016: LAPAROTOMY; N/A     Comment:  Procedure: EXPLORATORY LAPAROTOMY;  Surgeon: Clayburn Pert, MD;  Location: [facility];  Service: General;                Laterality: N/A; 11/14/2016: LAPAROTOMY; N/A     Comment:  Procedure: EXPLORATORY LAPAROTOMY; Irrigation, partial               closure;  Surgeon: Clayburn Pert, MD;  Location: [facility];  Service: General;  Laterality: N/A; 03/21/2019: TOTAL KNEE ARTHROPLASTY; Left     Comment:  Procedure: TOTAL KNEE ARTHROPLASTY;  Surgeon: Lovell Sheehan, MD;  Location: [facility];  Service: Orthopedics;               Laterality: Left;  BMI    Body Mass Index: 46.45 kg/m      Reproductive/Obstetrics negative OB ROS                             Anesthesia Physical Anesthesia Plan  ASA: 3  Anesthesia Plan: General   Post-op  Pain Management:    Induction: Intravenous  PONV Risk Score and Plan: Propofol infusion and TIVA  Airway Management Planned: Natural Airway and Nasal Cannula  Additional Equipment:   Intra-op Plan:   Post-operative Plan:   Informed Consent: I have reviewed the patients History and Physical, chart, labs and discussed the procedure including the risks, benefits and alternatives for the proposed anesthesia with the patient or authorized representative who has indicated his/her understanding and acceptance.     Dental Advisory Given  Plan Discussed with: Anesthesiologist, CRNA and Surgeon  Anesthesia  Plan Comments: (Patient consented for risks of anesthesia including but not limited to:  - adverse reactions to medications - risk of airway placement if required - damage to eyes, teeth, lips or other oral mucosa - nerve damage due to positioning  - sore throat or hoarseness - Damage to heart, brain, nerves, lungs, other parts of body or loss of life  Patient voiced understanding.)        Anesthesia Quick Evaluation

## 2022-10-04 ENCOUNTER — Ambulatory Visit: Payer: 59 | Attending: Cardiology | Admitting: Cardiology

## 2022-10-04 ENCOUNTER — Encounter: Payer: Self-pay | Admitting: Gastroenterology

## 2022-10-04 VITALS — BP 126/76 | HR 55 | Ht 66.0 in | Wt 301.0 lb

## 2022-10-04 DIAGNOSIS — I10 Essential (primary) hypertension: Secondary | ICD-10-CM

## 2022-10-04 DIAGNOSIS — E7800 Pure hypercholesterolemia, unspecified: Secondary | ICD-10-CM | POA: Diagnosis not present

## 2022-10-04 DIAGNOSIS — W19XXXD Unspecified fall, subsequent encounter: Secondary | ICD-10-CM | POA: Diagnosis not present

## 2022-10-04 LAB — SURGICAL PATHOLOGY

## 2022-10-04 NOTE — Patient Instructions (Signed)
Medication Instructions:   Your physician recommends that you continue on your current medications as directed. Please refer to the Current Medication list given to you today.  *If you need a refill on your cardiac medications before your next appointment, please call your pharmacy*   Lab Work:  NONE  If you have labs (blood work) drawn today and your tests are completely normal, you will receive your results only by: [place] (if you have MyChart) OR A paper copy in the mail If you have any lab test that is abnormal or we need to change your treatment, we will call you to review the results.   Testing/Procedures:  NONE   Follow-Up: At [facility], you and your health needs are our priority.  As part of our continuing mission to provide you with exceptional heart care, we have created designated Provider Care Teams.  These Care Teams include your primary Cardiologist (physician) and Advanced Practice Providers (APPs -  Physician Assistants and Nurse Practitioners) who all work together to provide you with the care you need, when you need it.  We recommend signing up for the patient portal called "MyChart".  Sign up information is provided on this After Visit Summary.  MyChart is used to connect with patients for Virtual Visits (Telemedicine).  Patients are able to view lab/test results, encounter notes, upcoming appointments, etc.  Non-urgent messages can be sent to your provider as well.   To learn more about what you can do with MyChart, go to [email or web address].    Your next appointment:     As needed  Provider:   You may see Kate Sable, MD or one of the following Advanced Practice Providers on your designated Care Team:   Murray Hodgkins, NP Christell Faith, PA-C Cadence Kathlen Mody, PA-C Gerrie Nordmann, NP

## 2022-10-04 NOTE — Progress Notes (Signed)
Cardiology Office Note:    Date:  10/04/2022   ID:  Peter [name], DOB June 02, 1961, MRN [id number]  PCP:  [name], [place] Providers Cardiologist:  Kate Sable, MD     Referring MD: Donnal Moat*   Chief Complaint  Patient presents with   NEW patient    Cardiac evaluation-Patient had recent fall (about 2 weeks ago) but states he did not pass out. Denies chest pain and SOB.   Peter [name] is a 62 y.o. male who is being seen today for the evaluation of fall at the request of [facility], [facility]*.   History of Present Illness:    Peter [name] is a 62 y.o. male with a hx of hyperlipidemia, hypertension, obesity who presents for evaluation after a fall.  Patient tripped over his own feet while at work hitting his head.  No loss of consciousness.  Was evaluated in the ED in which head CT showed no acute changes.  Denies chest pain or shortness of breath.  Denies any history of heart disease.  States following 2 other times, once rolling from his bed, another time he was walking in his hallway and tripped on a rug.  He endorses having a wobbly gait, supposed to use a cane which he does not use all the time.  All episodes of falls have occurred with him not using his cane.  Echo in 2017 showed EF 55 to 60%, Left heart cath 2017 no angiographically significant CAD  Past Medical History:  Diagnosis Date   Bipolar disorder    Blood in stool    Difficult intubation    Dyspnea    GERD (gastroesophageal reflux disease)    Glaucoma    since 2004   Hypertension    since 2000   Obesity, unspecified    Sleep apnea    Ulcer 2011   gastric   Unspecified hemorrhoids without mention of complication     Past Surgical History:  Procedure Laterality Date   APPENDECTOMY     APPLICATION OF WOUND VAC N/A 11/16/2016   Procedure: APPLICATION OF WOUND VAC-ABDOMINAL;  Surgeon: Florene Glen, MD;  Location: [facility];  Service:  General;  Laterality: N/A;   APPLICATION OF WOUND VAC N/A 11/18/2016   Procedure: APPLICATION OF WOUND VAC;  Surgeon: Florene Glen, MD;  Location: [facility];  Service: General;  Laterality: N/A;  change   APPLICATION OF WOUND VAC N/A 11/23/2016   Procedure: WOUND VAC CHANGE;  Surgeon: Olean Ree, MD;  Location: [facility];  Service: General;  Laterality: N/A;  wound vac application   CARDIAC CATHETERIZATION Left 06/23/2016   Procedure: Left Heart Cath and Coronary Angiography;  Surgeon: Nelva Bush, MD;  Location: [place] [facility];  Service: Cardiovascular;  Laterality: Left;   CHOLECYSTECTOMY  07/05/1990   COLONOSCOPY W/ POLYPECTOMY  05/22/2010   80mmtransverse colon polyp, traditional serrated adenoma,negative for high grade dysplasia & malignancy. rectal polyp-62mm,negative for dysplasia & malignancy.   COLONOSCOPY WITH PROPOFOL N/A 06/20/2019   Procedure: COLONOSCOPY WITH PROPOFOL;  Surgeon: Lin Landsman, MD;  Location[facility];  Service: Gastroenterology;  Laterality: N/A;   COLONOSCOPY WITH PROPOFOL N/A 06/21/2019   Procedure: COLONOSCOPY WITH PROPOFOL;  Surgeon: Lin Landsman, MD;  [facility];  Service: Gastroenterology;  Laterality: N/A;   COLONOSCOPY WITH PROPOFOL N/A 10/01/2022   Procedure: COLONOSCOPY WITH PROPOFOL;  Surgeon: Lin Landsman, MD;  Location: [facility] END[facility]COPY;  Service: Gastroenterology;  Laterality: N/A;  DRESSING CHANGE UNDER ANESTHESIA N/A 11/21/2016   Procedure: DRESSING CHANGE UNDER ANESTHESIA;  Surgeon: Florene Glen, MD;  Location: [facility];  Service: General;  Laterality: N/A;   ESOPHAGOGASTRODUODENOSCOPY (EGD) WITH PROPOFOL N/A 06/20/2019   Procedure: ESOPHAGOGASTRODUODENOSCOPY (EGD) WITH PROPOFOL;  Surgeon: Lin Landsman, MD;  Location: [facility];  Service: Gastroenterology;  Laterality: N/A;   ESOPHAGOGASTRODUODENOSCOPY (EGD) WITH PROPOFOL N/A 10/01/2022   Procedure: ESOPHAGOGASTRODUODENOSCOPY (EGD)  WITH PROPOFOL;  Surgeon: Lin Landsman, MD;  Location: [facility];  Service: Gastroenterology;  Laterality: N/A;   KNEE SURGERY Left 07/05/2008   LAPAROSCOPIC APPENDECTOMY N/A 10/30/2016   Procedure: APPENDECTOMY LAPAROSCOPIC changed  to open application of wound vac;  Surgeon: [name], MD;  Location: [facility];  Service: General;  Laterality: N/A;   LAPAROTOMY N/A 11/11/2016   Procedure: EXPLORATORY LAPAROTOMY, DEBRIDEMENT OF ABDOMINAL WOUND, ABDOMINAL [place];  Surgeon: [name], MD;  Location: [facility];  Service: General;  Laterality: N/A;   LAPAROTOMY N/A 11/12/2016   Procedure: EXPLORATORY LAPAROTOMY;  Surgeon: Clayburn Pert, MD;  Location: [facility];  Service: General;  Laterality: N/A;   LAPAROTOMY N/A 11/14/2016   Procedure: EXPLORATORY LAPAROTOMY; Irrigation, partial closure;  Surgeon: Clayburn Pert, MD;  Location: [facility];  Service: General;  Laterality: N/A;   TOTAL KNEE ARTHROPLASTY Left 03/21/2019   Procedure: TOTAL KNEE ARTHROPLASTY;  Surgeon: Lovell Sheehan, MD;  Location: [facility];  Service: Orthopedics;  Laterality: Left;    Current Medications: Current Meds  Medication Sig   amLODipine (NORVASC) 10 MG tablet Take 1 tablet (10 mg total) by mouth daily.   aspirin (ASPIRIN 81) 81 MG chewable tablet Chew 81 mg by mouth daily.   atorvastatin (LIPITOR) 40 MG tablet Take 1 tablet (40 mg total) by mouth daily.   azelastine (ASTELIN) 0.1 % nasal spray Place 1-2 sprays into both nostrils 2 (two) times daily.   carbamazepine (TEGRETOL) 200 MG tablet TAKE THREE TABLETS BY MOUTH EVERY MORNING AND TAKE TWO TABLETS BY MOUTH AT BEDTIME   celecoxib (CELEBREX) 200 MG capsule Take 200 mg by mouth 2 (two) times daily.   cetirizine (ZYRTEC) 10 MG tablet Take 10 mg by mouth daily.   chlorthalidone (HYGROTON) 25 MG tablet Take 1 tablet (25 mg total) by mouth daily.   Cholecalciferol (NAT-RUL VITAMIN D) 25 MCG (1000 UT) tablet Take 4 tablets (4,000 Units total) by  mouth daily. (Patient taking differently: Take 200 Units by mouth daily.)   cloNIDine (CATAPRES) 0.2 MG tablet Take 1 tablet (0.2 mg total) by mouth 2 (two) times daily.   fluticasone (FLONASE) 50 MCG/ACT nasal spray Place 1-2 sprays into both nostrils daily.   Garlic [id number] MG CAPS Take 300 mg by mouth daily.   hydrALAZINE (APRESOLINE) 100 MG tablet TAKE ONE TABLET BY MOUTH THREE TIMES A DAY   lamoTRIgine (LAMICTAL) 200 MG tablet Take 1 tablet (200 mg total) by mouth 2 (two) times daily.   latanoprost (XALATAN) 0.005 % ophthalmic solution Place 1 drop into both eyes at bedtime.   losartan (COZAAR) 100 MG tablet Take 1 tablet (100 mg total) by mouth daily.   metoprolol tartrate (LOPRESSOR) 50 MG tablet Take 1 tablet (50 mg total) by mouth 2 (two) times daily.   montelukast (SINGULAIR) 10 MG tablet Take 10 mg by mouth in the morning.   Omega-3 Fatty Acids (FISH OIL) 1000 MG CAPS Take 3,000 mg by mouth daily.   omeprazole (PRILOSEC) 40 MG capsule Take 1 capsule (40 mg total) by mouth 2 (  two) times daily before a meal for 30 days, THEN 1 capsule (40 mg total) daily before breakfast.   ondansetron (ZOFRAN) 8 MG tablet Take 1 tablet (8 mg total) by mouth every 8 (eight) hours as needed for nausea or vomiting.   potassium gluconate 595 (99 K) MG TABS tablet Take 1,190 mg by mouth 2 (two) times daily.   sertraline (ZOLOFT) 100 MG tablet Take 1 tablet (100 mg total) by mouth daily.   vitamin B-12 (CYANOCOBALAMIN) 1000 MCG tablet Take 1,000 mcg by mouth in the morning.     Allergies:   Oxycodone and Isosorbide   Social History   Socioeconomic History   Marital status: Married    Spouse name: Not on file   Number of children: Not on file   Years of education: Not on file   Highest education level: Associate degree: occupational, Hotel manager, or vocational program  Occupational History   Not on file  Tobacco Use   Smoking status: Never   Smokeless tobacco: Never  Vaping Use   Vaping Use: Never used   Substance and Sexual Activity   Alcohol use: Not Currently    Comment: occasionally   Drug use: No   Sexual activity: Not on file  Other Topics Concern   Not on file  Social History Narrative   Not on file   Social Determinants of Health   Financial Resource Strain: Low Risk  (09/24/2022)   Overall Financial Resource Strain (CARDIA)    Difficulty of Paying Living Expenses: Not very hard  Food Insecurity: No Food Insecurity (09/24/2022)   Hunger Vital Sign    Worried About Running Out of Food in the Last Year: Never true    Ran Out of Food in the Last Year: Never true  Transportation Needs: No Transportation Needs (09/24/2022)   PRAPARE - Hydrologist (Medical): No    Lack of Transportation (Non-Medical): No  Physical Activity: Insufficiently Active (09/24/2022)   Exercise Vital Sign    Days of Exercise per Week: 1 day    Minutes of Exercise per Session: 30 min  Stress: Stress Concern Present (09/24/2022)   [place]    Feeling of Stress : To some extent  Social Connections: Moderately Isolated (09/24/2022)   Social Connection and Isolation Panel [NHANES]    Frequency of Communication with Friends and Family: Once a week    Frequency of Social Gatherings with Friends and Family: Once a week    Attends Religious Services: More than 4 times per year    Active Member of [employer] or Organizations: No    Attends Music therapist: Not on file    Marital Status: Married     Family History: The patient's family history includes Cancer in an other family member; Hyperlipidemia in his mother; Hypertension in his mother.  ROS:   Please see the history of present illness.     All other systems reviewed and are negative.  EKGs/Labs/Other Studies Reviewed:    The following studies were reviewed today:   EKG:  EKG is  ordered today.  The ekg ordered today demonstrates bradycardia,  otherwise normal ECG, heart rate 55  Recent Labs: 05/31/2022: TSH 3.440 09/06/2022: Magnesium 1.8 09/24/2022: ALT 19; BUN 15; Creatinine, Ser 1.00; Hemoglobin 13.7; Platelets 208; Potassium 3.6; Sodium 133  Recent Lipid Panel    Component Value Date/Time   CHOL 155 09/24/2022 1048   TRIG 123 09/24/2022 1048  HDL 82 09/24/2022 1048   CHOLHDL 1.9 09/24/2022 1048   CHOLHDL 2.0 06/01/2017 0932   LDLCALC 52 09/24/2022 1048   LDLCALC 50 06/01/2017 0932     Risk Assessment/Calculations:             Physical Exam:    VS:  BP 126/76 (BP Location: Right Arm, Patient Position: Sitting, Cuff Size: Large)   Pulse (!) 55   Ht 5\' 6"  (1.676 m)   Wt (!) 301 lb (136.5 kg)   SpO2 98%   BMI 48.58 kg/m     Wt Readings from Last 3 Encounters:  10/04/22 (!) 301 lb (136.5 kg)  10/01/22 296 lb 9.6 oz (134.5 kg)  09/24/22 299 lb (135.6 kg)     GEN:  Well nourished, well developed in no acute distress HEENT: Normal NECK: No JVD; No carotid bruits CARDIAC: RRR, no murmurs, rubs, gallops RESPIRATORY:  Clear to auscultation without rales, wheezing or rhonchi  ABDOMEN: Soft, non-tender, non-distended MUSCULOSKELETAL:  No edema; No deformity  SKIN: Warm and dry NEUROLOGIC:  Alert and oriented x 3 PSYCHIATRIC:  Normal affect   ASSESSMENT:    1. Fall, subsequent encounter   2. Primary hypertension   3. Pure hypercholesterolemia   4. Morbid obesity    PLAN:    In order of problems listed above:  Fall, etiology appears to be mechanical due to wobbly gait and not using assistive devices.  Advised to use walker all the time.  Follow-up with PCP to consider rehab. Hypertension, BP controlled.  Continue current meds. Hyperlipidemia, cholesterol controlled, continue Lipitor. Morbid obesity, low-calorie diet advised.  Follow-up as needed     Medication Adjustments/Labs and Tests Ordered: Current medicines are reviewed at length with the patient today.  Concerns regarding medicines are  outlined above.  No orders of the defined types were placed in this encounter.  No orders of the defined types were placed in this encounter.   Patient Instructions  Medication Instructions:   Your physician recommends that you continue on your current medications as directed. Please refer to the Current Medication list given to you today.  *If you need a refill on your cardiac medications before your next appointment, please call your pharmacy*   Lab Work:  NONE  If you have labs (blood work) drawn today and your tests are completely normal, you will receive your results only by: [place] (if you have MyChart) OR A paper copy in the mail If you have any lab test that is abnormal or we need to change your treatment, we will call you to review the results.   Testing/Procedures:  NONE   Follow-Up: At [facility], you and your health needs are our priority.  As part of our continuing mission to provide you with exceptional heart care, we have created designated Provider Care Teams.  These Care Teams include your primary Cardiologist (physician) and Advanced Practice Providers (APPs -  Physician Assistants and Nurse Practitioners) who all work together to provide you with the care you need, when you need it.  We recommend signing up for the patient portal called "MyChart".  Sign up information is provided on this After Visit Summary.  MyChart is used to connect with patients for Virtual Visits (Telemedicine).  Patients are able to view lab/test results, encounter notes, upcoming appointments, etc.  Non-urgent messages can be sent to your provider as well.   To learn more about what you can do with MyChart, go to [email or web address].  Your next appointment:     As needed  Provider:   You may see Kate Sable, MD or one of the following Advanced Practice Providers on your designated Care Team:   Murray Hodgkins, NP Christell Faith, PA-C Cadence Kathlen Mody,  PA-C Gerrie Nordmann, NP    Signed, Kate Sable, MD  10/04/2022 11:40 AM    [place]

## 2022-10-05 NOTE — Addendum Note (Signed)
Addended by: Raelene Bott, Oziel Beitler L on: 10/05/2022 03:35 PM   Modules accepted: Orders

## 2022-10-08 ENCOUNTER — Ambulatory Visit: Payer: 59 | Admitting: Family Medicine

## 2022-10-10 ENCOUNTER — Other Ambulatory Visit: Payer: Self-pay | Admitting: Physician Assistant

## 2022-10-10 DIAGNOSIS — I10 Essential (primary) hypertension: Secondary | ICD-10-CM

## 2022-10-11 ENCOUNTER — Telehealth: Payer: Self-pay

## 2022-10-11 ENCOUNTER — Encounter: Payer: Self-pay | Admitting: Gastroenterology

## 2022-10-11 DIAGNOSIS — R112 Nausea with vomiting, unspecified: Secondary | ICD-10-CM

## 2022-10-11 NOTE — Telephone Encounter (Signed)
-----   Message from Toney Reil, MD sent at 10/11/2022  1:32 AM EDT ----- Results from upper endoscopy came back normal.  Recommend gastric emptying study if patient is agreeable for symptoms of nausea and vomiting  RV

## 2022-10-11 NOTE — Telephone Encounter (Signed)
Called patient and patient verbalized understanding of results. He is okay with having gastric emptying study. Called central scheduling and got patient schedule for  10/29/2022 [place] [facility] [place] Nothing to eat or drink after midnight. Do not take omeprazole or Zofran after midnight. Called and informed patient of date and time.

## 2022-10-13 ENCOUNTER — Other Ambulatory Visit: Payer: Self-pay | Admitting: Family Medicine

## 2022-10-13 DIAGNOSIS — I10 Essential (primary) hypertension: Secondary | ICD-10-CM

## 2022-10-13 NOTE — Telephone Encounter (Signed)
Pt called to check status of his request, he says he probably still has a week left.

## 2022-10-20 ENCOUNTER — Other Ambulatory Visit: Payer: Self-pay | Admitting: Family Medicine

## 2022-10-20 DIAGNOSIS — R112 Nausea with vomiting, unspecified: Secondary | ICD-10-CM

## 2022-10-25 DIAGNOSIS — G4733 Obstructive sleep apnea (adult) (pediatric): Secondary | ICD-10-CM | POA: Diagnosis not present

## 2022-10-25 DIAGNOSIS — K922 Gastrointestinal hemorrhage, unspecified: Secondary | ICD-10-CM | POA: Diagnosis not present

## 2022-10-25 DIAGNOSIS — F319 Bipolar disorder, unspecified: Secondary | ICD-10-CM | POA: Diagnosis not present

## 2022-10-25 DIAGNOSIS — G319 Degenerative disease of nervous system, unspecified: Secondary | ICD-10-CM | POA: Diagnosis not present

## 2022-10-25 DIAGNOSIS — K56609 Unspecified intestinal obstruction, unspecified as to partial versus complete obstruction: Secondary | ICD-10-CM | POA: Diagnosis not present

## 2022-10-25 DIAGNOSIS — R5382 Chronic fatigue, unspecified: Secondary | ICD-10-CM | POA: Diagnosis not present

## 2022-10-25 DIAGNOSIS — E291 Testicular hypofunction: Secondary | ICD-10-CM | POA: Diagnosis not present

## 2022-10-25 DIAGNOSIS — Z96652 Presence of left artificial knee joint: Secondary | ICD-10-CM | POA: Diagnosis not present

## 2022-10-25 DIAGNOSIS — I10 Essential (primary) hypertension: Secondary | ICD-10-CM | POA: Diagnosis not present

## 2022-10-25 DIAGNOSIS — R296 Repeated falls: Secondary | ICD-10-CM | POA: Diagnosis not present

## 2022-10-29 ENCOUNTER — Encounter
Admission: RE | Admit: 2022-10-29 | Discharge: 2022-10-29 | Disposition: A | Discharge status: Home / Self Care | Payer: 59 | Source: Ambulatory Visit | Attending: Gastroenterology | Admitting: Gastroenterology

## 2022-10-29 DIAGNOSIS — R112 Nausea with vomiting, unspecified: Secondary | ICD-10-CM | POA: Diagnosis not present

## 2022-10-29 DIAGNOSIS — K30 Functional dyspepsia: Secondary | ICD-10-CM | POA: Diagnosis not present

## 2022-10-29 MED ORDER — TECHNETIUM TC 99M SULFUR COLLOID
2.0000 | Freq: Once | INTRAVENOUS | Status: AC
  Administered 2022-10-29: 2.15 via ORAL

## 2022-11-02 DIAGNOSIS — R413 Other amnesia: Secondary | ICD-10-CM | POA: Diagnosis not present

## 2022-11-02 DIAGNOSIS — R296 Repeated falls: Secondary | ICD-10-CM | POA: Diagnosis not present

## 2022-11-02 DIAGNOSIS — R42 Dizziness and giddiness: Secondary | ICD-10-CM | POA: Diagnosis not present

## 2022-11-04 ENCOUNTER — Other Ambulatory Visit: Payer: Self-pay | Admitting: Family Medicine

## 2022-11-04 DIAGNOSIS — I10 Essential (primary) hypertension: Secondary | ICD-10-CM

## 2022-11-05 ENCOUNTER — Ambulatory Visit: Payer: Self-pay | Admitting: Family Medicine

## 2022-11-12 ENCOUNTER — Other Ambulatory Visit: Payer: Self-pay | Admitting: Family Medicine

## 2022-11-12 DIAGNOSIS — I10 Essential (primary) hypertension: Secondary | ICD-10-CM

## 2022-11-12 NOTE — Telephone Encounter (Signed)
Requested Prescriptions  Refused Prescriptions Disp Refills   losartan (COZAAR) 100 MG tablet [Pharmacy Med Name: LOSARTAN POTASSIUM 100 MG TAB] 30 tablet 0    Sig: TAKE 1 TABLET BY MOUTH EVERY DAY     Cardiovascular:  Angiotensin Receptor Blockers Passed - 11/12/2022  2:20 AM      Passed - Cr in normal range and within 180 days    Creat  Date Value Ref Range Status  06/21/2017 0.83 0.70 - 1.33 mg/dL Final    Comment:    For patients >64 years of age, the reference limit for Creatinine is approximately 13% higher for people identified as African-American. .    Creatinine, Ser  Date Value Ref Range Status  09/24/2022 1.00 0.76 - 1.27 mg/dL Final         Passed - K in normal range and within 180 days    Potassium  Date Value Ref Range Status  09/24/2022 3.6 3.5 - 5.2 mmol/L Final         Passed - Patient is not pregnant      Passed - Last BP in normal range    BP Readings from Last 1 Encounters:  10/04/22 126/76         Passed - Valid encounter within last 6 months    Recent Outpatient Visits           1 month ago Syncope and collapse   [facility] Merita Norton T, FNP   2 months ago Nausea and vomiting, unspecified vomiting type   [place] [facility], Tawanna Cooler, MD   6 months ago Essential (primary) hypertension   [place] [facility] Ronnald Ramp, MD   1 year ago Annual physical exam   [place] [facility] Bosie Clos, MD   1 year ago Essential (primary) hypertension   [place] [facility] Bosie Clos, MD

## 2022-12-07 DIAGNOSIS — R200 Anesthesia of skin: Secondary | ICD-10-CM | POA: Diagnosis not present

## 2022-12-13 ENCOUNTER — Other Ambulatory Visit: Payer: Self-pay | Admitting: Gastroenterology

## 2022-12-27 DIAGNOSIS — M1711 Unilateral primary osteoarthritis, right knee: Secondary | ICD-10-CM | POA: Diagnosis not present

## 2022-12-27 DIAGNOSIS — Z96652 Presence of left artificial knee joint: Secondary | ICD-10-CM | POA: Diagnosis not present

## 2022-12-31 DIAGNOSIS — F5101 Primary insomnia: Secondary | ICD-10-CM | POA: Diagnosis not present

## 2022-12-31 DIAGNOSIS — G4733 Obstructive sleep apnea (adult) (pediatric): Secondary | ICD-10-CM | POA: Diagnosis not present

## 2022-12-31 DIAGNOSIS — Z96652 Presence of left artificial knee joint: Secondary | ICD-10-CM | POA: Diagnosis not present

## 2022-12-31 DIAGNOSIS — I10 Essential (primary) hypertension: Secondary | ICD-10-CM | POA: Diagnosis not present

## 2022-12-31 DIAGNOSIS — F319 Bipolar disorder, unspecified: Secondary | ICD-10-CM | POA: Diagnosis not present

## 2022-12-31 DIAGNOSIS — K56609 Unspecified intestinal obstruction, unspecified as to partial versus complete obstruction: Secondary | ICD-10-CM | POA: Diagnosis not present

## 2022-12-31 DIAGNOSIS — Z0000 Encounter for general adult medical examination without abnormal findings: Secondary | ICD-10-CM | POA: Diagnosis not present

## 2022-12-31 DIAGNOSIS — Z96612 Presence of left artificial shoulder joint: Secondary | ICD-10-CM | POA: Diagnosis not present

## 2023-01-03 DIAGNOSIS — R42 Dizziness and giddiness: Secondary | ICD-10-CM | POA: Diagnosis not present

## 2023-01-03 DIAGNOSIS — R200 Anesthesia of skin: Secondary | ICD-10-CM | POA: Diagnosis not present

## 2023-01-03 DIAGNOSIS — R413 Other amnesia: Secondary | ICD-10-CM | POA: Diagnosis not present

## 2023-01-03 DIAGNOSIS — R296 Repeated falls: Secondary | ICD-10-CM | POA: Diagnosis not present

## 2023-01-12 ENCOUNTER — Other Ambulatory Visit: Payer: Self-pay | Admitting: Family Medicine

## 2023-01-12 DIAGNOSIS — I10 Essential (primary) hypertension: Secondary | ICD-10-CM

## 2023-01-12 NOTE — Telephone Encounter (Signed)
Requested Prescriptions  Pending Prescriptions Disp Refills   amLODipine (NORVASC) 10 MG tablet [Pharmacy Med Name: AMLODIPINE BESYLATE 10 MG TAB] 30 tablet 5    Sig: TAKE 1 TABLET BY MOUTH EVERY DAY     Cardiovascular: Calcium Channel Blockers 2 Passed - 01/12/2023  2:30 AM      Passed - Last BP in normal range    BP Readings from Last 1 Encounters:  10/04/22 126/76         Passed - Last Heart Rate in normal range    Pulse Readings from Last 1 Encounters:  10/04/22 (!) 55         Passed - Valid encounter within last 6 months    Recent Outpatient Visits           3 months ago Syncope and collapse   [place] [facility] Merita Norton T, FNP   4 months ago Nausea and vomiting, unspecified vomiting type   [place] [facility] Simmons-Robinson, Tawanna Cooler, MD   8 months ago Essential (primary) hypertension   [place] [facility] Simmons-Robinson, [place], MD   1 year ago Annual physical exam   [place] [facility] Bosie Clos, MD   1 year ago Essential (primary) hypertension   [place] [facility] Bosie Clos, MD               metoprolol tartrate (LOPRESSOR) 50 MG tablet [Pharmacy Med Name: METOPROLOL TARTRATE 50 MG TAB] 60 tablet 5    Sig: TAKE 1 TABLET BY MOUTH TWICE A DAY     Cardiovascular:  Beta Blockers Passed - 01/12/2023  2:30 AM      Passed - Last BP in normal range    BP Readings from Last 1 Encounters:  10/04/22 126/76         Passed - Last Heart Rate in normal range    Pulse Readings from Last 1 Encounters:  10/04/22 (!) 55         Passed - Valid encounter within last 6 months    Recent Outpatient Visits           3 months ago Syncope and collapse   [facility] Merita Norton T, FNP   4 months ago Nausea and vomiting, unspecified vomiting type   [place] [facility] Simmons-Robinson, [place], MD   8 months ago  Essential (primary) hypertension   [place] [facility] Simmons-Robinson, [place], MD   1 year ago Annual physical exam   [place] [facility] Bosie Clos, MD   1 year ago Essential (primary) hypertension   [place] [facility] Bosie Clos, MD               chlorthalidone (HYGROTON) 25 MG tablet [Pharmacy Med Name: CHLORTHALIDONE 25 MG TABLET] 30 tablet 5    Sig: TAKE 1 TABLET (25 MG TOTAL) BY MOUTH DAILY.     Cardiovascular: Diuretics - Thiazide Failed - 01/12/2023  2:30 AM      Failed - Na in normal range and within 180 days    Sodium  Date Value Ref Range Status  09/24/2022 133 (L) 134 - 144 mmol/L Final         Passed - Cr in normal range and within 180 days    Creat  Date Value Ref Range Status  06/21/2017 0.83 0.70 - 1.33 mg/dL Final    Comment:  For patients >8 years of age, the reference limit for Creatinine is approximately 13% higher for people identified as African-American. .    Creatinine, Ser  Date Value Ref Range Status  09/24/2022 1.00 0.76 - 1.27 mg/dL Final         Passed - K in normal range and within 180 days    Potassium  Date Value Ref Range Status  09/24/2022 3.6 3.5 - 5.2 mmol/L Final         Passed - Last BP in normal range    BP Readings from Last 1 Encounters:  10/04/22 126/76         Passed - Valid encounter within last 6 months    Recent Outpatient Visits           3 months ago Syncope and collapse   [facility] Merita Norton T, FNP   4 months ago Nausea and vomiting, unspecified vomiting type   [place] [facility] Simmons-Robinson, [place], MD   8 months ago Essential (primary) hypertension   [place] [facility] Ronnald Ramp, MD   1 year ago Annual physical exam   [place] [facility] Bosie Clos, MD   1 year ago Essential (primary) hypertension   [facility] Bosie Clos, MD

## 2023-01-13 ENCOUNTER — Other Ambulatory Visit: Payer: Self-pay | Admitting: Family Medicine

## 2023-01-13 DIAGNOSIS — I10 Essential (primary) hypertension: Secondary | ICD-10-CM

## 2023-01-13 NOTE — Telephone Encounter (Signed)
Requested Prescriptions  Pending Prescriptions Disp Refills   cloNIDine (CATAPRES) 0.2 MG tablet [Pharmacy Med Name: CLONIDINE HCL 0.2 MG TABLET] 180 tablet 0    Sig: TAKE 1 TABLET BY MOUTH TWICE A DAY     Cardiovascular:  Alpha-2 Agonists Passed - 01/13/2023  2:12 AM      Passed - Last BP in normal range    BP Readings from Last 1 Encounters:  10/04/22 126/76         Passed - Last Heart Rate in normal range    Pulse Readings from Last 1 Encounters:  10/04/22 (!) 55         Passed - Valid encounter within last 6 months    Recent Outpatient Visits           3 months ago Syncope and collapse   [place] [facility] Merita Norton T, FNP   4 months ago Nausea and vomiting, unspecified vomiting type   [place] [facility] Simmons-Robinson, [place], MD   8 months ago Essential (primary) hypertension   [facility] Ronnald Ramp, MD   1 year ago Annual physical exam   [place] [facility] Bosie Clos, MD   1 year ago Essential (primary) hypertension   [place] [facility] Bosie Clos, MD

## 2023-02-10 ENCOUNTER — Other Ambulatory Visit: Payer: Self-pay | Admitting: Physician Assistant

## 2023-03-18 ENCOUNTER — Ambulatory Visit: Payer: 59 | Admitting: Physician Assistant

## 2023-03-18 ENCOUNTER — Encounter: Payer: Self-pay | Admitting: Physician Assistant

## 2023-03-18 DIAGNOSIS — Z79899 Other long term (current) drug therapy: Secondary | ICD-10-CM

## 2023-03-18 DIAGNOSIS — R5383 Other fatigue: Secondary | ICD-10-CM

## 2023-03-18 DIAGNOSIS — F319 Bipolar disorder, unspecified: Secondary | ICD-10-CM | POA: Diagnosis not present

## 2023-03-18 NOTE — Progress Notes (Unsigned)
[facility] Med Check  Patient ID: Peter [name],  MRN: [id number]  PCP: Bosie Clos, MD  Date of Evaluation: 03/18/2023 Time spent: 31 minutes  Chief Complaint:  Chief Complaint   Anxiety; Depression; Follow-up    HISTORY/CURRENT STATUS: For routine med check.  Accompanied by his wife, Peter [name].   Peter [name] is doing well as far as his mental health medications go.  Work is stressful but it has nothing new.  Denies anxiety, no panic attacks.  He went off the Zoloft since our last visit.  It was an accident, could not get the medicine from the pharmacy or something like that, cannot really remember.  He has been off of it for several months.  States he actually feels better being off of it and does not want to restart it unless absolutely necessary.  Patient is able to enjoy things.  Energy and motivation are fair.   Complains of fatigue.  Comes and goes.  Not really affecting his job but does affect his home life.  He does not feel like doing things sometimes and wants/needs to rest a lot.  No extreme sadness, tearfulness, or feelings of hopelessness.  Sleeps ok, uses CPAP.  ADLs and personal hygiene are normal.   Denies any changes in concentration, making decisions, or remembering things.  Appetite has not changed.  Weight is stable. Denies suicidal or homicidal thoughts.  Review of Systems  Constitutional:  Positive for malaise/fatigue.  HENT: Negative.    Eyes: Negative.   Respiratory: Negative.    Cardiovascular: Negative.   Gastrointestinal: Negative.   Genitourinary: Negative.   Musculoskeletal: Negative.   Skin: Negative.   Neurological: Negative.   Endo/Heme/Allergies: Negative.   Psychiatric/Behavioral:         See HPI   Individual Medical History/ Review of Systems: Changes? :Yes    Had a fall 09/21/2022, no LOC, had laceration on head. Has seen Neuro. No neuro deficits.   Past medications for mental health diagnoses include: Zoloft, Cymbalta did not help,  Pristiq did not help, Seroquel, Xanax, Depakote, Lamictal, Equetro, Klonopin, Modafinil didn't help energy.   Allergies: Oxycodone and Isosorbide  Current Medications:  Current Outpatient Medications:    amLODipine (NORVASC) 10 MG tablet, TAKE 1 TABLET BY MOUTH EVERY DAY, Disp: 90 tablet, Rfl: 0   aspirin (ASPIRIN 81) 81 MG chewable tablet, Chew 81 mg by mouth daily., Disp: , Rfl:    atorvastatin (LIPITOR) 40 MG tablet, Take 1 tablet (40 mg total) by mouth daily., Disp: 90 tablet, Rfl: 2   azelastine (ASTELIN) 0.1 % nasal spray, Place 1-2 sprays into both nostrils 2 (two) times daily., Disp: , Rfl:    carbamazepine (TEGRETOL) 200 MG tablet, TAKE THREE TABLETS BY MOUTH EVERY MORNING AND TAKE TWO TABLETS BY MOUTH AT BEDTIME, Disp: 450 tablet, Rfl: 5   celecoxib (CELEBREX) 200 MG capsule, Take 200 mg by mouth 2 (two) times daily., Disp: , Rfl:    cetirizine (ZYRTEC) 10 MG tablet, Take 10 mg by mouth daily., Disp: , Rfl:    chlorthalidone (HYGROTON) 25 MG tablet, TAKE 1 TABLET (25 MG TOTAL) BY MOUTH DAILY., Disp: 90 tablet, Rfl: 0   Cholecalciferol (NAT-RUL VITAMIN D) 25 MCG (1000 UT) tablet, Take 4 tablets (4,000 Units total) by mouth daily. (Patient taking differently: Take 200 Units by mouth daily.), Disp: 120 tablet, Rfl: 0   cloNIDine (CATAPRES) 0.2 MG tablet, TAKE 1 TABLET BY MOUTH TWICE A DAY, Disp: 180 tablet, Rfl: 0   fluticasone (FLONASE) 50 MCG/ACT  nasal spray, Place 1-2 sprays into both nostrils daily., Disp: , Rfl:    Garlic 300 MG CAPS, Take 300 mg by mouth daily., Disp: , Rfl:    hydrALAZINE (APRESOLINE) 100 MG tablet, TAKE 1 TABLET BY MOUTH THREE TIMES A DAY, Disp: 90 tablet, Rfl: 0   lamoTRIgine (LAMICTAL) 200 MG tablet, TAKE 1 TABLET BY MOUTH TWICE A DAY, Disp: 60 tablet, Rfl: 5   latanoprost (XALATAN) 0.005 % ophthalmic solution, Place 1 drop into both eyes at bedtime., Disp: , Rfl:    losartan (COZAAR) 100 MG tablet, TAKE 1 TABLET BY MOUTH EVERY DAY, Disp: 30 tablet, Rfl: 0    metoprolol tartrate (LOPRESSOR) 50 MG tablet, TAKE 1 TABLET BY MOUTH TWICE A DAY, Disp: 180 tablet, Rfl: 0   montelukast (SINGULAIR) 10 MG tablet, Take 10 mg by mouth in the morning., Disp: , Rfl:    Omega-3 Fatty Acids (FISH OIL) 1000 MG CAPS, Take 3,000 mg by mouth daily., Disp: , Rfl:    omeprazole (PRILOSEC) 40 MG capsule, Take 1 capsule (40 mg total) by mouth daily., Disp: 30 capsule, Rfl: 0   ondansetron (ZOFRAN) 8 MG tablet, Take 1 tablet (8 mg total) by mouth every 8 (eight) hours as needed., Disp: 18 tablet, Rfl: 0   potassium gluconate 595 (99 K) MG TABS tablet, Take 1,190 mg by mouth 2 (two) times daily., Disp: , Rfl:    vitamin B-12 (CYANOCOBALAMIN) 1000 MCG tablet, Take 1,000 mcg by mouth in the morning., Disp: , Rfl:  Medication Side Effects: none  Family Medical/ Social History: Changes? No  MENTAL HEALTH EXAM:  There were no vitals taken for this visit.There is no height or weight on file to calculate BMI.  General Appearance: Casual, Neat, Well Groomed and Obese  Eye Contact:  Good  Speech:  Clear and Coherent and Normal Rate  Volume:  Normal  Mood:  Euthymic  Affect:  Congruent  Thought Process:  Goal Directed and Descriptions of Associations: Circumstantial  Orientation:  Full (Time, Place, and Person)  Thought Content: Logical   Suicidal Thoughts:  No  Homicidal Thoughts:  No  Memory:  WNL  Judgement:  Good  Insight:  Good  Psychomotor Activity:  Normal  Concentration:  Concentration: Good and Attention Span: Good  Recall:  Good  Fund of Knowledge: Good  Language: Good  Assets:  Desire for Improvement Financial Resources/Insurance Housing Transportation Vocational/Educational  ADL's:  Intact  Cognition: WNL  Prognosis:  Good   DIAGNOSES:    ICD-10-CM   1. Bipolar I disorder (HCC)  F31.9 Carbamazepine level, total    Lamotrigine level    CBC with Differential/Platelet    Comprehensive metabolic panel    Lipid panel    Hemoglobin A1c    TSH    2.  Fatigue, unspecified type  R53.83 Carbamazepine level, total    Lamotrigine level    CBC with Differential/Platelet    Comprehensive metabolic panel    Hemoglobin A1c    TSH    3. Encounter for long-term (current) use of medications  Z79.899 Carbamazepine level, total    Lamotrigine level    CBC with Differential/Platelet    Comprehensive metabolic panel    Lipid panel    Hemoglobin A1c    TSH     Receiving Psychotherapy: No   RECOMMENDATIONS:  PDMP was reviewed.  Last Xanax filled 03/27/2021. I provided 31 minutes of face to face time during this encounter, including time spent before and after the visit in records  review, medical decision making, counseling pertinent to today's visit, and charting.   We discussed the fatigue.  I recommend checking labs.  He has an appointment with his PCP in the next few months so can discuss with him as well.    Sleep hygiene discussed.  Zoloft has already been discontinued.  Continue carbamazepine 200 mg, 3 p.o. every morning, 2 p.o. nightly. Continue Clonidine 0.2 mg, bid. Continue Lamictal 200 mg 1 p.o. twice daily.  Continue multivitamin, B complex, omega-3, vitamin D per med sheet. Labs ordered as noted above.  Needs to be fasting. Return in 6 months.  Melony Overly, PA-C

## 2023-03-24 ENCOUNTER — Encounter: Payer: Self-pay | Admitting: Physician Assistant

## 2023-03-25 LAB — LIPID PANEL
Chol/HDL Ratio: 1.8 ratio (ref 0.0–5.0)
Cholesterol, Total: 185 mg/dL (ref 100–199)
HDL: 102 mg/dL (ref >39)
LDL Chol Calc (NIH): 68 mg/dL (ref 0–99)
Triglycerides: 85 mg/dL (ref 0–149)
VLDL Cholesterol Cal: 15 mg/dL (ref 5–40)

## 2023-03-25 LAB — LAMOTRIGINE LEVEL: Lamotrigine Lvl: 1.5 ug/mL — ABNORMAL LOW (ref 2.0–20.0)

## 2023-03-25 LAB — CBC WITH DIFFERENTIAL/PLATELET
Basophils Absolute: 0 10*3/uL (ref 0.0–0.2)
Basos: 0 %
EOS (ABSOLUTE): 0.2 10*3/uL (ref 0.0–0.4)
Eos: 2 %
Hematocrit: 42.5 % (ref 37.5–51.0)
Hemoglobin: 14.1 g/dL (ref 13.0–17.7)
Immature Grans (Abs): 0 10*3/uL (ref 0.0–0.1)
Immature Granulocytes: 0 %
Lymphocytes Absolute: 2.1 10*3/uL (ref 0.7–3.1)
Lymphs: 27 %
MCH: 29.7 pg (ref 26.6–33.0)
MCHC: 33.2 g/dL (ref 31.5–35.7)
MCV: 90 fL (ref 79–97)
Monocytes Absolute: 0.5 10*3/uL (ref 0.1–0.9)
Monocytes: 7 %
Neutrophils Absolute: 5 10*3/uL (ref 1.4–7.0)
Neutrophils: 64 %
Platelets: 206 10*3/uL (ref 150–450)
RBC: 4.74 x10E6/uL (ref 4.14–5.80)
RDW: 12.7 % (ref 11.6–15.4)
WBC: 7.8 10*3/uL (ref 3.4–10.8)

## 2023-03-25 LAB — COMPREHENSIVE METABOLIC PANEL
ALT: 22 IU/L (ref 0–44)
AST: 27 IU/L (ref 0–40)
Albumin: 4 g/dL (ref 3.9–4.9)
Alkaline Phosphatase: 119 IU/L (ref 44–121)
BUN/Creatinine Ratio: 25 — ABNORMAL HIGH (ref 10–24)
BUN: 26 mg/dL (ref 8–27)
Bilirubin Total: 0.5 mg/dL (ref 0.0–1.2)
CO2: 25 mmol/L (ref 20–29)
Calcium: 9.4 mg/dL (ref 8.6–10.2)
Chloride: 100 mmol/L (ref 96–106)
Creatinine, Ser: 1.06 mg/dL (ref 0.76–1.27)
Globulin, Total: 2.9 g/dL (ref 1.5–4.5)
Glucose: 95 mg/dL (ref 70–99)
Potassium: 4 mmol/L (ref 3.5–5.2)
Sodium: 140 mmol/L (ref 134–144)
Total Protein: 6.9 g/dL (ref 6.0–8.5)
eGFR: 78 mL/min/{1.73_m2} (ref >59)

## 2023-03-25 LAB — CARBAMAZEPINE LEVEL, TOTAL: Carbamazepine (Tegretol), S: 7.3 ug/mL (ref 4.0–12.0)

## 2023-03-25 LAB — HEMOGLOBIN A1C
Est. average glucose Bld gHb Est-mCnc: 111 mg/dL
Hgb A1c MFr Bld: 5.5 % (ref 4.8–5.6)

## 2023-03-25 LAB — TSH: TSH: 2.02 u[IU]/mL (ref 0.450–4.500)

## 2023-03-25 NOTE — Progress Notes (Signed)
Please let him know all labs are good. Continue all meds without change.

## 2023-04-08 ENCOUNTER — Other Ambulatory Visit: Payer: Self-pay | Admitting: Family Medicine

## 2023-04-08 DIAGNOSIS — I10 Essential (primary) hypertension: Secondary | ICD-10-CM

## 2023-04-08 DIAGNOSIS — E7800 Pure hypercholesterolemia, unspecified: Secondary | ICD-10-CM

## 2023-04-08 NOTE — Telephone Encounter (Signed)
Prescriber no longer at our practice  Requested Prescriptions  Refused Prescriptions Disp Refills   atorvastatin (LIPITOR) 40 MG tablet [Pharmacy Med Name: ATORVASTATIN 40 MG TABLET] 30 tablet 8    Sig: TAKE 1 TABLET BY MOUTH EVERY DAY     Cardiovascular:  Antilipid - Statins Failed - 04/08/2023  2:42 AM      Failed - Lipid Panel in normal range within the last 12 months    Cholesterol, Total  Date Value Ref Range Status  03/23/2023 185 100 - 199 mg/dL Final   LDL Cholesterol (Calc)  Date Value Ref Range Status  06/01/2017 50 mg/dL (calc) Final    Comment:    Reference range: <100 . Desirable range <100 mg/dL for primary prevention;   <70 mg/dL for patients with CHD or diabetic patients  with > or = 2 CHD risk factors. Marland Kitchen LDL-C is now calculated using the Martin-Hopkins  calculation, which is a validated novel method providing  better accuracy than the Friedewald equation in the  estimation of LDL-C.  Horald Pollen et al. Lenox Ahr. 1610;960(45): 2061-2068  (http://education.[employer].com/faq/FAQ164)    LDL Chol Calc (NIH)  Date Value Ref Range Status  03/23/2023 68 0 - 99 mg/dL Final   HDL  Date Value Ref Range Status  03/23/2023 102 >39 mg/dL Final   Triglycerides  Date Value Ref Range Status  03/23/2023 85 0 - 149 mg/dL Final         Passed - Patient is not pregnant      Passed - Valid encounter within last 12 months    Recent Outpatient Visits           6 months ago Syncope and collapse   [place] [facility] Merita Norton T, FNP   7 months ago Nausea and vomiting, unspecified vomiting type   [place] [facility] Simmons-Robinson, [employer], MD   11 months ago Essential (primary) hypertension   [place] [facility] Simmons-Robinson, [place], MD   1 year ago Annual physical exam   [place] [facility] Bosie Clos, MD   2 years ago Essential (primary) hypertension   [place]  [facility] Bosie Clos, MD               metoprolol tartrate (LOPRESSOR) 50 MG tablet [Pharmacy Med Name: METOPROLOL TARTRATE 50 MG TAB] 60 tablet 2    Sig: TAKE 1 TABLET BY MOUTH TWICE A DAY     Cardiovascular:  Beta Blockers Failed - 04/08/2023  2:42 AM      Failed - Valid encounter within last 6 months    Recent Outpatient Visits           6 months ago Syncope and collapse   [place] [facility] Merita Norton T, FNP   7 months ago Nausea and vomiting, unspecified vomiting type   [place] [facility] Simmons-Robinson, [place], MD   11 months ago Essential (primary) hypertension   [place] [facility] Simmons-Robinson, [place], MD   1 year ago Annual physical exam   [place] [facility] Bosie Clos, MD   2 years ago Essential (primary) hypertension   [place] [facility] Bosie Clos, MD              Passed - Last BP in normal range    BP Readings from Last 1 Encounters:  10/04/22 126/76         Passed -  Last Heart Rate in normal range    Pulse Readings from Last 1 Encounters:  10/04/22 (!) 55          chlorthalidone (HYGROTON) 25 MG tablet [Pharmacy Med Name: CHLORTHALIDONE 25 MG TABLET] 30 tablet 2    Sig: TAKE 1 TABLET (25 MG TOTAL) BY MOUTH DAILY.     Cardiovascular: Diuretics - Thiazide Failed - 04/08/2023  2:42 AM      Failed - Valid encounter within last 6 months    Recent Outpatient Visits           6 months ago Syncope and collapse   [place] [facility] Merita Norton T, FNP   7 months ago Nausea and vomiting, unspecified vomiting type   [place] [facility] Simmons-Robinson, Tawanna [place], MD   11 months ago Essential (primary) hypertension   [place] [facility] Simmons-Robinson, Tawanna [place], MD   1 year ago Annual physical exam   [place] Bosie Clos, MD   2 years ago Essential (primary) hypertension   [place] [facility] Bosie Clos, MD              Passed - Cr in normal range and within 180 days    Creat  Date Value Ref Range Status  06/21/2017 0.83 0.70 - 1.33 mg/dL Final    Comment:    For patients >75 years of age, the reference limit for Creatinine is approximately 13% higher for people identified as African-American. .    Creatinine, Ser  Date Value Ref Range Status  03/23/2023 1.06 0.76 - 1.27 mg/dL Final         Passed - K in normal range and within 180 days    Potassium  Date Value Ref Range Status  03/23/2023 4.0 3.5 - 5.2 mmol/L Final         Passed - Na in normal range and within 180 days    Sodium  Date Value Ref Range Status  03/23/2023 140 134 - 144 mmol/L Final         Passed - Last BP in normal range    BP Readings from Last 1 Encounters:  10/04/22 126/76          amLODipine (NORVASC) 10 MG tablet [Pharmacy Med Name: AMLODIPINE BESYLATE 10 MG TAB] 30 tablet 2    Sig: TAKE 1 TABLET BY MOUTH EVERY DAY     Cardiovascular: Calcium Channel Blockers 2 Failed - 04/08/2023  2:42 AM      Failed - Valid encounter within last 6 months    Recent Outpatient Visits           6 months ago Syncope and collapse   [place] [facility] Merita Norton T, FNP   7 months ago Nausea and vomiting, unspecified vomiting type   [place] [facility] Simmons-Robinson, [place], MD   11 months ago Essential (primary) hypertension   [place] [facility] Simmons-Robinson, [place], MD   1 year ago Annual physical exam   [place] [facility] Bosie Clos, MD   2 years ago Essential (primary) hypertension    [facility], MD              Passed - Last BP in normal range    BP Readings from Last 1 Encounters:  10/04/22 126/76         Passed - Last Heart  Rate in normal range    Pulse Readings from Last 1 Encounters:  10/04/22 (!) 55          cloNIDine (CATAPRES) 0.2 MG tablet [Pharmacy Med Name: CLONIDINE HCL 0.2 MG TABLET] 60 tablet 2    Sig: TAKE 1 TABLET BY MOUTH TWICE A DAY     Cardiovascular:  Alpha-2 Agonists Failed - 04/08/2023  2:42 AM      Failed - Valid encounter within last 6 months    Recent Outpatient Visits           6 months ago Syncope and collapse   [place] [facility] Merita Norton T, FNP   7 months ago Nausea and vomiting, unspecified vomiting type   [place] [facility] Simmons-Robinson, [place], MD   11 months ago Essential (primary) hypertension   [place] [facility] Simmons-Robinson, [place], MD   1 year ago Annual physical exam   [place] [facility] Bosie Clos, MD   2 years ago Essential (primary) hypertension   [place] [facility] Bosie Clos, MD              Passed - Last BP in normal range    BP Readings from Last 1 Encounters:  10/04/22 126/76         Passed - Last Heart Rate in normal range    Pulse Readings from Last 1 Encounters:  10/04/22 (!) 55

## 2023-05-16 ENCOUNTER — Telehealth: Payer: Self-pay | Admitting: Physician Assistant

## 2023-05-16 NOTE — Telephone Encounter (Signed)
Peter [name] called at 11:00 to report that he will be changing to [facility] in January.  He said that his Tegretol will be very expensive and wants to know if there is an alternative.  Next appt is 09/16/23.  Please call to discuss.  It is not an expensive drug and can use goodrx for a low cost.  Perhaps it will need a PA?

## 2023-05-16 NOTE — Telephone Encounter (Signed)
Reviewed. Correct it can be filled by using Good Rx, if pt is on Brand it will be more expensive I am sure. There is no need to change or look for an alternative until we get insurance information to check into it. Possibly need a PA, but I usually don't do them on that generic medication.

## 2023-05-18 NOTE — Telephone Encounter (Signed)
Patient notified

## 2023-08-01 ENCOUNTER — Telehealth: Payer: Self-pay | Admitting: Physician Assistant

## 2023-08-01 ENCOUNTER — Other Ambulatory Visit: Payer: Self-pay

## 2023-08-01 MED ORDER — CARBAMAZEPINE 200 MG PO TABS: ORAL_TABLET | ORAL | 1 refills | Status: DC

## 2023-08-01 NOTE — Telephone Encounter (Signed)
[facility]epine to [facility].

## 2023-08-01 NOTE — Telephone Encounter (Signed)
Peter [name] called at 11:08 to request that his prescription for Carbamazepine be transferred to a new pharmacy - [facility] in [place].  He has changes insurance and had to change pharmacies.  Next appt 3/14

## 2023-09-16 ENCOUNTER — Ambulatory Visit (INDEPENDENT_AMBULATORY_CARE_PROVIDER_SITE_OTHER): Payer: 59 | Admitting: Physician Assistant

## 2023-09-16 ENCOUNTER — Encounter: Payer: Self-pay | Admitting: Physician Assistant

## 2023-09-16 DIAGNOSIS — G4700 Insomnia, unspecified: Secondary | ICD-10-CM

## 2023-09-16 DIAGNOSIS — F319 Bipolar disorder, unspecified: Secondary | ICD-10-CM

## 2023-09-16 DIAGNOSIS — F411 Generalized anxiety disorder: Secondary | ICD-10-CM

## 2023-09-16 MED ORDER — CARBAMAZEPINE 200 MG PO TABS: ORAL_TABLET | ORAL | 5 refills | Status: DC

## 2023-09-16 NOTE — Progress Notes (Signed)
 [facility]  Patient ID: Peter [name],  MRN: [id number]  PCP: Bosie Clos, MD  Date of Evaluation: 09/16/2023 Time spent:30 minutes  Chief Complaint:  Chief Complaint   Follow-up    HISTORY/CURRENT STATUS: For routine med check.  Accompanied by his wife, [name].   Doing well overall. Patient is able to enjoy things.  Energy and motivation are good.  He retired in January.  He and his wife are going to [place] next month to look at things and decide if they want to move there.  [name] was born there and has dual citizenship.  No extreme sadness, tearfulness, or feelings of hopelessness.  Sleeps well most of the time. ADLs and personal hygiene are normal.   Denies any changes in concentration, making decisions, or remembering things.  Appetite has not changed.  Weight is stable.   No c/o anxiety.  Denies suicidal or homicidal thoughts.  Patient denies increased energy with decreased need for sleep, increased talkativeness, racing thoughts, impulsivity or risky behaviors, increased spending, increased libido, grandiosity, increased irritability or anger, paranoia, or hallucinations.  Denies dizziness, syncope, seizures, numbness, tingling, tremor, tics, unsteady gait, slurred speech, confusion. Denies muscle or joint pain, stiffness, or dystonia.  Individual Medical History/ Review of Systems: Changes? :No       Past medications for mental health diagnoses include: Zoloft, Cymbalta did not help, Pristiq did not help, Seroquel, Xanax, Depakote, Lamictal, Equetro, Klonopin, Modafinil didn't help energy.   Allergies: Oxycodone and Isosorbide  Current Medications:  Current Outpatient Medications:    amLODipine (NORVASC) 10 MG tablet, TAKE 1 TABLET BY MOUTH EVERY DAY, Disp: 90 tablet, Rfl: 0   aspirin (ASPIRIN 81) 81 MG chewable tablet, Chew 81 mg by mouth daily., Disp: , Rfl:    atorvastatin (LIPITOR) 40 MG tablet, Take 1 tablet (40 mg total) by mouth daily.,  Disp: 90 tablet, Rfl: 2   azelastine (ASTELIN) 0.1 % nasal spray, Place 1-2 sprays into both nostrils 2 (two) times daily., Disp: , Rfl:    cetirizine (ZYRTEC) 10 MG tablet, Take 10 mg by mouth daily., Disp: , Rfl:    chlorthalidone (HYGROTON) 25 MG tablet, TAKE 1 TABLET (25 MG TOTAL) BY MOUTH DAILY., Disp: 90 tablet, Rfl: 0   Cholecalciferol (NAT-RUL VITAMIN D) 25 MCG (1000 UT) tablet, Take 4 tablets (4,000 Units total) by mouth daily. (Patient taking differently: Take 200 Units by mouth daily.), Disp: 120 tablet, Rfl: 0   cloNIDine (CATAPRES) 0.2 MG tablet, TAKE 1 TABLET BY MOUTH TWICE A DAY, Disp: 180 tablet, Rfl: 0   fluticasone (FLONASE) 50 MCG/ACT nasal spray, Place 1-2 sprays into both nostrils daily., Disp: , Rfl:    Garlic 300 MG CAPS, Take 300 mg by mouth daily., Disp: , Rfl:    hydrALAZINE (APRESOLINE) 100 MG tablet, TAKE 1 TABLET BY MOUTH THREE TIMES A DAY, Disp: 90 tablet, Rfl: 0   lamoTRIgine (LAMICTAL) 200 MG tablet, TAKE 1 TABLET BY MOUTH TWICE A DAY, Disp: 60 tablet, Rfl: 5   latanoprost (XALATAN) 0.005 % ophthalmic solution, Place 1 drop into both eyes at bedtime., Disp: , Rfl:    losartan (COZAAR) 100 MG tablet, TAKE 1 TABLET BY MOUTH EVERY DAY, Disp: 30 tablet, Rfl: 0   metoprolol tartrate (LOPRESSOR) 50 MG tablet, TAKE 1 TABLET BY MOUTH TWICE A DAY, Disp: 180 tablet, Rfl: 0   montelukast (SINGULAIR) 10 MG tablet, Take 10 mg by mouth in the morning., Disp: , Rfl:    Omega-3 Fatty Acids (FISH  OIL) 1000 MG CAPS, Take 3,000 mg by mouth daily., Disp: , Rfl:    omeprazole (PRILOSEC) 40 MG capsule, Take 1 capsule (40 mg total) by mouth daily., Disp: 30 capsule, Rfl: 0   potassium gluconate 595 (99 K) MG TABS tablet, Take 1,190 mg by mouth 2 (two) times daily., Disp: , Rfl:    vitamin B-12 (CYANOCOBALAMIN) 1000 MCG tablet, Take 1,000 mcg by mouth in the morning., Disp: , Rfl:    carbamazepine (TEGRETOL) 200 MG tablet, TAKE THREE TABLETS BY MOUTH EVERY MORNING AND TAKE TWO TABLETS BY  MOUTH AT BEDTIME, Disp: 150 tablet, Rfl: 5   celecoxib (CELEBREX) 200 MG capsule, Take 200 mg by mouth 2 (two) times daily. (Patient not taking: Reported on 09/16/2023), Disp: , Rfl:    ondansetron (ZOFRAN) 8 MG tablet, Take 1 tablet (8 mg total) by mouth every 8 (eight) hours as needed. (Patient not taking: Reported on 09/16/2023), Disp: 18 tablet, Rfl: 0 Medication Side Effects: none  Family Medical/ Social History: Changes? Retired 07/2023  MENTAL HEALTH EXAM:  There were no vitals taken for this visit.There is no height or weight on file to calculate BMI.  General Appearance: Casual, Neat, Well Groomed and Obese  Eye Contact:  Good  Speech:  Clear and Coherent and Normal Rate  Volume:  Normal  Mood:  Euthymic  Affect:  Congruent  Thought Process:  Goal Directed and Descriptions of Associations: Circumstantial  Orientation:  Full (Time, Place, and Person)  Thought Content: Logical   Suicidal Thoughts:  No  Homicidal Thoughts:  No  Memory:  WNL  Judgement:  Good  Insight:  Good  Psychomotor Activity:   walks slowly with a cane  Concentration:  Concentration: Good and Attention Span: Good  Recall:  Good  Fund of Knowledge: Good  Language: Good  Assets:  Desire for Improvement Financial Resources/Insurance Housing Transportation Vocational/Educational  ADL's:  Intact  Cognition: WNL  Prognosis:  Good   Labs 03/23/2023 CBC with differential normal CMP unremarkable Lipid panel completely normal Hemoglobin A1c 5.5 TSH 2.02 Carbamazepine 7.3 Lamictal level 1.5  DIAGNOSES:    ICD-10-CM   1. Bipolar I disorder (HCC)  F31.9     2. Generalized anxiety disorder  F41.1     3. Insomnia, unspecified type  G47.00       Receiving Psychotherapy: No   RECOMMENDATIONS:  PDMP was reviewed.  Prescriptions for hydrocodone and tramadol prescribed at different times last year. I provided 30 minutes of face to face time during this encounter, including time spent before and after  the visit in records review, medical decision making, counseling pertinent to today's visit, and charting.   Congrats on his recent retirement!   He's doing well with current meds so no changes need to be made.   Continue carbamazepine 200 mg, 3 p.o. every morning, 2 p.o. nightly. Continue Clonidine 0.2 mg, bid. Continue Lamictal 200 mg 1 p.o. twice daily.  Continue multivitamin, B complex, omega-3, vitamin D per med sheet. Return in 6 months.  Melony Overly, PA-C

## 2024-03-07 ENCOUNTER — Other Ambulatory Visit: Payer: Self-pay | Admitting: Orthopedic Surgery

## 2024-03-19 ENCOUNTER — Encounter: Payer: Self-pay | Admitting: Physician Assistant

## 2024-03-19 ENCOUNTER — Ambulatory Visit: Admitting: Physician Assistant

## 2024-03-19 DIAGNOSIS — F411 Generalized anxiety disorder: Secondary | ICD-10-CM | POA: Diagnosis not present

## 2024-03-19 DIAGNOSIS — F319 Bipolar disorder, unspecified: Secondary | ICD-10-CM

## 2024-03-19 DIAGNOSIS — G4700 Insomnia, unspecified: Secondary | ICD-10-CM

## 2024-03-19 NOTE — Progress Notes (Signed)
 [facility]  Patient ID: Peter [name],  MRN: [id number]  PCP: Bertrum Charlie CROME, MD  Date of Evaluation: 03/19/2024 Time spent:25 minutes  Chief Complaint:  Chief Complaint   Depression; Follow-up    HISTORY/CURRENT STATUS: For routine med check.  Accompanied by his wife, Ragena.   Doing well as far as his mental health goes.  He is having a knee replacement next week.  He is excited to get it done, he has been in pain for a long time.  He has been losing weight on purpose in part to be able to do the knee replacement.  No extreme sadness, tearfulness, or feelings of hopelessness.  Sleeps ok. ADLs and personal hygiene are normal.   Denies any changes in concentration, making decisions, or remembering things.  No complaints of anxiety.  No SI/HI.  No reports of manic symptoms such as increased energy with decreased need for sleep, increased talkativeness, racing thoughts, impulsivity or risky behaviors, increased spending, increased libido, grandiosity, increased irritability or anger, paranoia, or hallucinations.  Individual Medical History/ Review of Systems: Changes? :Yes      retinal tear a few weeks ago. Having right knee replacement next week.   Past medications for mental health diagnoses include: Zoloft , Cymbalta did not help, Pristiq did not help, Seroquel, Xanax , Depakote, Lamictal , Equetro , Klonopin , Modafinil  didn't help energy.   Allergies: Oxycodone  and Isosorbide   Current Medications:  Current Outpatient Medications:    amLODipine  (NORVASC ) 10 MG tablet, TAKE 1 TABLET BY MOUTH EVERY DAY, Disp: 90 tablet, Rfl: 0   aspirin  (ASPIRIN  81) 81 MG chewable tablet, Chew 81 mg by mouth in the morning., Disp: , Rfl:    atorvastatin  (LIPITOR) 40 MG tablet, Take 1 tablet (40 mg total) by mouth daily., Disp: 90 tablet, Rfl: 2   azelastine  (ASTELIN ) 0.1 % nasal spray, Place 1-2 sprays into both nostrils 2 (two) times daily., Disp: , Rfl:    carbamazepine   (TEGRETOL ) 200 MG tablet, TAKE THREE TABLETS BY MOUTH EVERY MORNING AND TAKE TWO TABLETS BY MOUTH AT BEDTIME, Disp: 150 tablet, Rfl: 5   cetirizine (ZYRTEC) 10 MG tablet, Take 10 mg by mouth in the morning and at bedtime., Disp: , Rfl:    chlorthalidone  (HYGROTON ) 25 MG tablet, TAKE 1 TABLET (25 MG TOTAL) BY MOUTH DAILY., Disp: 90 tablet, Rfl: 0   cholecalciferol (VITAMIN D3) 25 MCG (1000 UNIT) tablet, Take 1,000 Units by mouth in the morning and at bedtime., Disp: , Rfl:    cloNIDine  (CATAPRES ) 0.2 MG tablet, TAKE 1 TABLET BY MOUTH TWICE A DAY, Disp: 180 tablet, Rfl: 0   cyanocobalamin  (VITAMIN B12) 1000 MCG tablet, Take 1,000 mcg by mouth in the morning., Disp: , Rfl:    fluticasone  (FLONASE ) 50 MCG/ACT nasal spray, Place 1-2 sprays into both nostrils daily as needed for allergies., Disp: , Rfl:    GARLIC PO, Take 1 capsule by mouth in the morning. Kyolic Garlic, Disp: , Rfl:    hydrALAZINE  (APRESOLINE ) 100 MG tablet, TAKE 1 TABLET BY MOUTH THREE TIMES A DAY, Disp: 90 tablet, Rfl: 0   ipratropium (ATROVENT ) 0.06 % nasal spray, Place 2 sprays into both nostrils in the morning, at noon, and at bedtime., Disp: , Rfl:    lamoTRIgine  (LAMICTAL ) 200 MG tablet, TAKE 1 TABLET BY MOUTH TWICE A DAY, Disp: 60 tablet, Rfl: 5   latanoprost  (XALATAN ) 0.005 % ophthalmic solution, Place 1 drop into both eyes at bedtime., Disp: , Rfl:    losartan  (COZAAR ) 100 MG  tablet, TAKE 1 TABLET BY MOUTH EVERY DAY, Disp: 30 tablet, Rfl: 0   metoprolol  tartrate (LOPRESSOR ) 100 MG tablet, Take 50 mg by mouth 2 (two) times daily., Disp: , Rfl:    montelukast  (SINGULAIR ) 10 MG tablet, Take 10 mg by mouth in the morning., Disp: , Rfl:    Omega-3 Fatty Acids (FISH OIL PO), Take 1 capsule by mouth in the morning, at noon, and at bedtime., Disp: , Rfl:    potassium gluconate 595 (99 K) MG TABS tablet, Take 1,190 mg by mouth 2 (two) times daily., Disp: , Rfl:  Medication Side Effects: none  Family Medical/ Social History: Changes?  Retired 07/2023  MENTAL HEALTH EXAM:  There were no vitals taken for this visit.There is no height or weight on file to calculate BMI.  General Appearance: Casual, Neat, Well Groomed and Obese  Eye Contact:  Good  Speech:  Clear and Coherent and Normal Rate  Volume:  Normal  Mood:  Euthymic  Affect:  Congruent  Thought Process:  Goal Directed and Descriptions of Associations: Circumstantial  Orientation:  Full (Time, Place, and Person)  Thought Content: Logical   Suicidal Thoughts:  No  Homicidal Thoughts:  No  Memory:  WNL  Judgement:  Good  Insight:  Good  Psychomotor Activity:  walks slowly with a cane  Concentration:  Concentration: Good and Attention Span: Good  Recall:  Good  Fund of Knowledge: Good  Language: Good  Assets:  Desire for Improvement Financial Resources/Insurance Housing Resilience Transportation Vocational/Educational  ADL's:  Intact  Cognition: WNL  Prognosis:  Good   Labs 01/05/2024 CBZ 11.8 Other labs reviewed on chart  DIAGNOSES:    ICD-10-CM   1. Bipolar I disorder (HCC)  F31.9     2. Generalized anxiety disorder  F41.1     3. Insomnia, unspecified type  G47.00       Receiving Psychotherapy: No   RECOMMENDATIONS:  PDMP was reviewed.  Prescriptions for hydrocodone  and tramadol  prescribed at different times last year. I provided approximately 25  minutes of face to face time during this encounter, including time spent before and after the visit in records review, medical decision making, counseling pertinent to today's visit, and charting.   He is doing well on current medications so no changes will be made.  Continue carbamazepine  200 mg, 3 p.o. every morning, 2 p.o. nightly. Continue Clonidine  0.2 mg, bid. Continue Lamictal  200 mg 1 p.o. twice daily.  Continue multivitamin, B complex, omega-3, vitamin D  per med sheet. Return in 6 months.  Verneita Cooks, PA-C

## 2024-03-21 ENCOUNTER — Encounter: Payer: Self-pay | Admitting: Orthopedic Surgery

## 2024-03-21 ENCOUNTER — Other Ambulatory Visit: Payer: Self-pay

## 2024-03-21 ENCOUNTER — Encounter
Admission: RE | Admit: 2024-03-21 | Discharge: 2024-03-21 | Disposition: A | Discharge status: Home / Self Care | Payer: Self-pay | Source: Ambulatory Visit | Attending: Orthopedic Surgery | Admitting: Orthopedic Surgery

## 2024-03-21 DIAGNOSIS — Z79899 Other long term (current) drug therapy: Secondary | ICD-10-CM

## 2024-03-21 DIAGNOSIS — Z01818 Encounter for other preprocedural examination: Secondary | ICD-10-CM | POA: Insufficient documentation

## 2024-03-21 DIAGNOSIS — Z01810 Encounter for preprocedural cardiovascular examination: Secondary | ICD-10-CM | POA: Diagnosis present

## 2024-03-21 DIAGNOSIS — M1711 Unilateral primary osteoarthritis, right knee: Secondary | ICD-10-CM | POA: Insufficient documentation

## 2024-03-21 DIAGNOSIS — Z01812 Encounter for preprocedural laboratory examination: Secondary | ICD-10-CM

## 2024-03-21 DIAGNOSIS — E876 Hypokalemia: Secondary | ICD-10-CM

## 2024-03-21 LAB — COMPREHENSIVE METABOLIC PANEL WITH GFR
ALT: 18 U/L (ref 0–44)
AST: 27 U/L (ref 15–41)
Albumin: 3.7 g/dL (ref 3.5–5.0)
Alkaline Phosphatase: 91 U/L (ref 38–126)
Anion gap: 13 (ref 5–15)
BUN: 19 mg/dL (ref 8–23)
CO2: 28 mmol/L (ref 22–32)
Calcium: 9 mg/dL (ref 8.9–10.3)
Chloride: 99 mmol/L (ref 98–111)
Creatinine, Ser: 0.99 mg/dL (ref 0.61–1.24)
GFR, Estimated: >60 mL/min (ref >60)
Glucose, Bld: 90 mg/dL (ref 70–99)
Potassium: 3 mmol/L — ABNORMAL LOW (ref 3.5–5.1)
Sodium: 140 mmol/L (ref 135–145)
Total Bilirubin: 0.8 mg/dL (ref 0.0–1.2)
Total Protein: 7.3 g/dL (ref 6.5–8.1)

## 2024-03-21 LAB — URINALYSIS, ROUTINE W REFLEX MICROSCOPIC
Bilirubin Urine: NEGATIVE
Glucose, UA: NEGATIVE mg/dL
Hgb urine dipstick: NEGATIVE
Ketones, ur: NEGATIVE mg/dL
Nitrite: NEGATIVE
Protein, ur: NEGATIVE mg/dL
Specific Gravity, Urine: 1.016 (ref 1.005–1.030)
pH: 6 (ref 5.0–8.0)

## 2024-03-21 LAB — CBC WITH DIFFERENTIAL/PLATELET
Abs Immature Granulocytes: 0.04 K/uL (ref 0.00–0.07)
Basophils Absolute: 0.1 K/uL (ref 0.0–0.1)
Basophils Relative: 1 %
Eosinophils Absolute: 0.1 K/uL (ref 0.0–0.5)
Eosinophils Relative: 2 %
HCT: 41.6 % (ref 39.0–52.0)
Hemoglobin: 14.3 g/dL (ref 13.0–17.0)
Immature Granulocytes: 1 %
Lymphocytes Relative: 29 %
Lymphs Abs: 1.9 K/uL (ref 0.7–4.0)
MCH: 30.7 pg (ref 26.0–34.0)
MCHC: 34.4 g/dL (ref 30.0–36.0)
MCV: 89.3 fL (ref 80.0–100.0)
Monocytes Absolute: 0.6 K/uL (ref 0.1–1.0)
Monocytes Relative: 9 %
Neutro Abs: 3.8 K/uL (ref 1.7–7.7)
Neutrophils Relative %: 58 %
Platelets: 221 K/uL (ref 150–400)
RBC: 4.66 MIL/uL (ref 4.22–5.81)
RDW: 12.7 % (ref 11.5–15.5)
WBC: 6.5 K/uL (ref 4.0–10.5)
nRBC: 0 % (ref 0.0–0.2)

## 2024-03-21 LAB — SURGICAL PCR SCREEN
MRSA, PCR: NEGATIVE
Staphylococcus aureus: NEGATIVE

## 2024-03-21 MED ORDER — POTASSIUM CHLORIDE CRYS ER 20 MEQ PO TBCR: EXTENDED_RELEASE_TABLET | ORAL | 0 refills | Status: DC

## 2024-03-21 MED ORDER — POTASSIUM CHLORIDE CRYS ER 20 MEQ PO TBCR: EXTENDED_RELEASE_TABLET | ORAL | 0 refills | Status: AC

## 2024-03-21 NOTE — Patient Instructions (Addendum)
 Your procedure is scheduled on: 03/28/2024 Wednesday  Report to the Registration Desk on the 1st floor of the [facility]. To find out your arrival time, please call [phone] between 1PM - 3PM on: 03/27/2024  Tuesday If your arrival time is 6:00 am, do not arrive before that time as the [facility] entrance doors do not open until 6:00 am.  REMEMBER: Instructions that are not followed completely may result in serious medical risk, up to and including death; or upon the discretion of your surgeon and anesthesiologist your surgery may need to be rescheduled.  Do not eat food after midnight the night before surgery.  No gum chewing or hard candies.  You may however, drink CLEAR liquids up to 2 hours before you are scheduled to arrive for your surgery. Do not drink anything within 2 hours of your scheduled arrival time.  Clear liquids include: - water   - apple juice without pulp - gatorade (not RED colors) - black coffee or tea (Do NOT add milk or creamers to the coffee or tea) Do NOT drink anything that is not on this list.   In addition, your doctor has ordered for you to drink the provided:  Ensure Pre-Surgery Clear Carbohydrate Drink   Drinking this carbohydrate drink up to two hours before surgery helps to reduce insulin  resistance and improve patient outcomes. Please complete drinking 2 hours before scheduled arrival time.  One week prior to surgery: Stop Anti-inflammatories (NSAIDS) such as Advil , Aleve, Ibuprofen , Motrin , Naproxen, Naprosyn and Aspirin  based products such as Excedrin, Goody's Powder, BC Powder. Stop ANY OVER THE COUNTER supplements until after surgery like Garlic, omega 3, B-12, vit D3  You may however, continue to take Tylenol  if needed for pain up until the day of surgery.   **Follow recommendations regarding stopping blood thinners.**  Ask Dr. Bertrum regarding Aspirin  recommendation and please remember the last dose as instructed.    Continue  taking all of your other prescription medications up until the day of surgery.  ON THE DAY OF SURGERY ONLY TAKE THESE MEDICATIONS WITH SIPS OF WATER :   amLODipine  (NORVASC ) atorvastatin  (LIPITOR)  carbamazepine  (TEGRETOL )  chlorthalidone  (HYGROTON ) cloNIDine  (CATAPRES ) famotidine  (PEPCID ) hydrALAZINE  (APRESOLINE ) lamoTRIgine  (LAMICTAL ) metoprolol  tartrate  potassium gluconate  Use inhalers on the day of surgery and bring to the hospital.  Use nasal spray as prescribed.   Use eye drop as prescribed.   No Alcohol  for 24 hours before or after surgery.  No Smoking including e-cigarettes for 24 hours before surgery.  No chewable tobacco products for at least 6 hours before surgery.  No nicotine patches on the day of surgery.  Do not use any recreational drugs for at least a week (preferably 2 weeks) before your surgery.  Please be advised that the combination of cocaine and anesthesia may have negative outcomes, up to and including death. If you test positive for cocaine, your surgery will be cancelled.  On the morning of surgery brush your teeth with toothpaste and water , you may rinse your mouth with mouthwash if you wish. Do not swallow any toothpaste or mouthwash.  Use CHG Soap or wipes as directed on instruction sheet.  Do not wear jewelry, make-up, hairpins, clips or nail polish.  For welded (permanent) jewelry: bracelets, anklets, waist bands, etc.  Please have this removed prior to surgery.  If it is not removed, there is a chance that hospital personnel will need to cut it off on the day of surgery.  Do not wear lotions,  powders, or perfumes.   Do not shave body hair from the neck down 48 hours before surgery.  Contact lenses, hearing aids and dentures may not be worn into surgery.  Do not bring valuables to the hospital. [facility] is not responsible for any missing/lost belongings or valuables.    Bring your C-PAP to the hospital in case you may have to  spend the night.   Notify your doctor if there is any change in your medical condition (cold, fever, infection).  Wear comfortable clothing (specific to your surgery type) to the hospital.  After surgery, you can help prevent lung complications by doing breathing exercises.  Take deep breaths and cough every 1-2 hours. Your doctor may order a device called an Incentive Spirometer to help you take deep breaths. When coughing or sneezing, hold a pillow firmly against your incision with both hands. This is called "splinting." Doing this helps protect your incision. It also decreases belly discomfort.  If you are being admitted to the hospital overnight, leave your suitcase in the car. After surgery it may be brought to your room.  In case of increased patient census, it may be necessary for you, the patient, to continue your postoperative care in the Same Day Surgery department.  If you are being discharged the day of surgery, you will not be allowed to drive home. You will need a responsible individual to drive you home and stay with you for 24 hours after surgery.    Please call the Pre-admissions Testing Dept. at [phone] if you have any questions about these instructions.  Surgery Visitation Policy:  Patients having surgery or a procedure may have two visitors.  Children under the age of 41 must have an adult with them who is not the patient.  Inpatient Visitation:    Visiting hours are 7 a.m. to 8 p.m. Up to four visitors are allowed at one time in a patient room. The visitors may rotate out with other people during the day.  One visitor age 33 or older may stay with the patient overnight and must be in the room by 8 p.m.   Merchandiser, retail to address health-related social needs:  [email or web address]     Pre-operative 5 CHG Bath Instructions   You can play a key role in reducing the risk of infection after surgery. Your skin needs to be as free  of germs as possible. You can reduce the number of germs on your skin by washing with CHG (chlorhexidine  gluconate) soap before surgery. CHG is an antiseptic soap that kills germs and continues to kill germs even after washing.   DO NOT use if you have an allergy to chlorhexidine /CHG or antibacterial soaps. If your skin becomes reddened or irritated, stop using the CHG and notify one of our RNs at [phone].   Please shower with the CHG soap starting 4 days before surgery using the following schedule:         Please keep in mind the following:  DO NOT shave, including legs and underarms, starting the day of your first shower.   You may shave your face at any point before/day of surgery.  Place clean sheets on your bed the day you start using CHG soap. Use a clean washcloth (not used since being washed) for each shower. DO NOT sleep with pets once you start using the CHG.   CHG Shower Instructions:  If you choose to wash your hair and private area, wash first with your  normal shampoo/soap.  After you use shampoo/soap, rinse your hair and body thoroughly to remove shampoo/soap residue.  Turn the water  OFF and apply about 3 tablespoons (45 ml) of CHG soap to a CLEAN washcloth.  Apply CHG soap ONLY FROM YOUR NECK DOWN TO YOUR TOES (washing for 3-5 minutes)  DO NOT use CHG soap on face, private areas, open wounds, or sores.  Pay special attention to the area where your surgery is being performed.  If you are having back surgery, having someone wash your back for you may be helpful. Wait 2 minutes after CHG soap is applied, then you may rinse off the CHG soap.  Pat dry with a clean towel  Put on clean clothes/pajamas   If you choose to wear lotion, please use ONLY the CHG-compatible lotions on the back of this paper.     Additional instructions for the day of surgery: DO NOT APPLY any lotions, deodorants, cologne, or perfumes.   Put on clean/comfortable clothes.  Brush your teeth.   Ask your nurse before applying any prescription medications to the skin.      CHG Compatible Lotions   Aveeno Moisturizing lotion  Cetaphil Moisturizing Cream  Cetaphil Moisturizing Lotion  Clairol Herbal Essence Moisturizing Lotion, Dry Skin  Clairol Herbal Essence Moisturizing Lotion, Extra Dry Skin  Clairol Herbal Essence Moisturizing Lotion, Normal Skin  Curel Age Defying Therapeutic Moisturizing Lotion with Alpha Hydroxy  Curel Extreme Care Body Lotion  Curel Soothing Hands Moisturizing Hand Lotion  Curel Therapeutic Moisturizing Cream, Fragrance-Free  Curel Therapeutic Moisturizing Lotion, Fragrance-Free  Curel Therapeutic Moisturizing Lotion, Original Formula  Eucerin Daily Replenishing Lotion  Eucerin Dry Skin Therapy Plus Alpha Hydroxy Crme  Eucerin Dry Skin Therapy Plus Alpha Hydroxy Lotion  Eucerin Original Crme  Eucerin Original Lotion  Eucerin Plus Crme Eucerin Plus Lotion  Eucerin TriLipid Replenishing Lotion  Keri Anti-Bacterial Hand Lotion  Keri Deep Conditioning Original Lotion Dry Skin Formula Softly Scented  Keri Deep Conditioning Original Lotion, Fragrance Free Sensitive Skin Formula  Keri Lotion Fast Absorbing Fragrance Free Sensitive Skin Formula  Keri Lotion Fast Absorbing Softly Scented Dry Skin Formula  Keri Original Lotion  Keri Skin Renewal Lotion Keri Silky Smooth Lotion  Keri Silky Smooth Sensitive Skin Lotion  Nivea Body Creamy Conditioning Oil  Nivea Body Extra Enriched Lotion  Nivea Body Original Lotion  Nivea Body Sheer Moisturizing Lotion Nivea Crme  Nivea Skin Firming Lotion  NutraDerm 30 Skin Lotion  NutraDerm Skin Lotion  NutraDerm Therapeutic Skin Cream  NutraDerm Therapeutic Skin Lotion  ProShield Protective Hand Cream  Provon moisturizing lotion    How to Use an Incentive Spirometer  An incentive spirometer is a tool that measures how well you are filling your lungs with each breath. Learning to take long, deep breaths  using this tool can help you keep your lungs clear and active. This may help to reverse or lessen your chance of developing breathing (pulmonary) problems, especially infection. You may be asked to use a spirometer: After a surgery. If you have a lung problem or a history of smoking. After a long period of time when you have been unable to move or be active. If the spirometer includes an indicator to show the highest number that you have reached, your health care provider or respiratory therapist will help you set a goal. Keep a log of your progress as told by your health care provider. What are the risks? Breathing too quickly may cause dizziness or cause you to pass  out. Take your time so you do not get dizzy or light-headed. If you are in pain, you may need to take pain medicine before doing incentive spirometry. It is harder to take a deep breath if you are having pain. How to use your incentive spirometer  Sit up on the edge of your bed or on a chair. Hold the incentive spirometer so that it is in an upright position. Before you use the spirometer, breathe out normally. Place the mouthpiece in your mouth. Make sure your lips are closed tightly around it. Breathe in slowly and as deeply as you can through your mouth, causing the piston or the ball to rise toward the top of the chamber. Hold your breath for 3-5 seconds, or for as long as possible. If the spirometer includes a coach indicator, use this to guide you in breathing. Slow down your breathing if the indicator goes above the marked areas. Remove the mouthpiece from your mouth and breathe out normally. The piston or ball will return to the bottom of the chamber. Rest for a few seconds, then repeat the steps 10 or more times. Take your time and take a few normal breaths between deep breaths so that you do not get dizzy or light-headed. Do this every 1-2 hours when you are awake. If the spirometer includes a goal marker to show the  highest number you have reached (best effort), use this as a goal to work toward during each repetition. After each set of 10 deep breaths, cough a few times. This will help to make sure that your lungs are clear. If you have an incision on your chest or abdomen from surgery, place a pillow or a rolled-up towel firmly against the incision when you cough. This can help to reduce pain while taking deep breaths and coughing. General tips When you are able to get out of bed: Walk around often. Continue to take deep breaths and cough in order to clear your lungs. Keep using the incentive spirometer until your health care provider says it is okay to stop using it. If you have been in the hospital, you may be told to keep using the spirometer at home. Contact a health care provider if: You are having difficulty using the spirometer. You have trouble using the spirometer as often as instructed. Your pain medicine is not giving enough relief for you to use the spirometer as told. You have a fever. Get help right away if: You develop shortness of breath. You develop a cough with bloody mucus from the lungs. You have fluid or blood coming from an incision site after you cough. Summary An incentive spirometer is a tool that can help you learn to take long, deep breaths to keep your lungs clear and active. You may be asked to use a spirometer after a surgery, if you have a lung problem or a history of smoking, or if you have been inactive for a long period of time. Use your incentive spirometer as instructed every 1-2 hours while you are awake. If you have an incision on your chest or abdomen, place a pillow or a rolled-up towel firmly against your incision when you cough. This will help to reduce pain. Get help right away if you have shortness of breath, you cough up bloody mucus, or blood comes from your incision when you cough. This information is not intended to replace advice given to you by your  health care provider. Make sure you discuss any questions  you have with your health care provider. Document Revised: 09/10/2019 Document Reviewed: 09/10/2019 Elsevier Patient Education  2023 Elsevier Inc.     Preoperative Educational Videos for Total Hip, Knee and Shoulder Replacements  To better prepare for surgery, please view our videos that explain the physical activity and discharge planning required to have the best surgical recovery at [facility].  [email or web address]  Questions? Call [phone] or email [email or web address]

## 2024-03-21 NOTE — Addendum Note (Signed)
 Addended by: ELNOR PRIDE on: 03/21/2024 04:03 PM   Modules accepted: Orders

## 2024-03-21 NOTE — Progress Notes (Signed)
 [facility] Perioperative Services: Pre-Admission/Anesthesia Testing  Abnormal Lab Notification and Treatment Plan of Care   Date: 03/21/24  Name: Peter [name] DOB: 1960-09-08 MRN:   [id number]  Re: Abnormal labs noted during PAT appointment   Notified:  Provider Name Provider Role Notification Mode  Lorelle Hussar, MD Orthopedics (Surgeon) Routed and/or faxed via RANELL Bertrum Charlie LITTIE, MD Primary Care Provider Routed and/or faxed via [facility]   Clinical Information and Notes:  ABNORMAL LAB VALUE(S): Lab Results  Component Value Date   K 3.0 (L) 03/21/2024   Glory DELENA Doom is scheduled for an elective ARTHROPLASTY, KNEE, TOTAL (Right: Knee) on 03/28/2024. In review of his medication reconciliation, it is noted that the patient is taking prescribed diuretic medications (chlorthalidone  25 mg) daily.   Please note, in efforts to promote a safe and effective anesthetic course, per current guidelines/standards set by the [facility] anesthesia team, the minimal acceptable K+ level for the patient to proceed with general anesthesia is 3.0 mmol/L. With that being said, if the patient drops any lower, his elective procedure will need to be postponed until K+ is better optimized. In efforts to prevent case cancellation, and ultimately to promote the safety of this patient undergoing sedation/anesthesia, will make efforts to optimize pre-surgical K+ level allowing the surgical intervention to proceed as planned.    Impression and Plan:  SOPHIE QUILES found to be HYPOkalemic at 3.0 mmol/L on preoperative labs.   He is on thiazide-like diuretic therapy. Additionally, patient takes an OTC K+ supplement (potassium gluconate 595 mg) BID. This formulation contains 99 mg of elemental potassium per tablet, which is roughly 2.5 mEq K+. With him taking 2 of the 595 mg OTC tablets BID, this translates into patient receiving 10 mEq K+ daily. RDA for K+ in males 18+ is 3500 mg from all  sources (dietary + supplemental).   Discussed diuretic therapy as likely etiology in the absence of GI related symptoms (no diarrhea). Patient denies regular use of laxative medications. Reviewed other potential causes, including decreased intake of dietary K+ and warmer weather resulting in increased insensible losses. Reviewed plans for short term preoperative optimization as follows:   Hold current OTC K+ supplement (potassium gluconate) while on Rx for preoperative optimization.   Start K-Dur as prescribed (40 mEq x 1 dose, then 20 mEq daily x 7 days). Rx sent to patient's pharmacy on record as follows.   Meds ordered this encounter  Medications   potassium chloride  SA (KLOR-CON  M) 20 MEQ tablet    Sig: Take 2 tablets (40 mEq) today, then 1 tablet (20 mEq) daily until surgery. Be sure to take dose on day of surgery. Follow up with PCP for repeat labs.    Dispense:  9 tablet    Refill:  0    Please contact the patient as soon as it is available for pickup. Rx is for preoperative K+ optimization and needs to be started ASAP.   Encouraged patient to follow up with PCP about 2-3 weeks postoperatively to have labs rechecked to ensure that levels are remaining within normal range. Discussed nutritional intake of K+ rich foods as an adjunctive way to keep his K+ levels normal; list of K+ rich foods provided. Also mentioned ORS, however advised him not to rely solely on these drinks, as they are high in Na+, and he has a HTN diagnosis.   Will send copy of this note to surgeon and PCP to make them aware of K+ level and  plans for correction. Discussed that PCP may elect to pursue a change in diuretic therapy to a K+ sparing type medication, or alternatively, they may consider increasing daily OTC K+ supplement or switching over to a prescription formulation if levels remain low on recheck. Order entered to recheck K+ on the day of his surgery to ensure optimization. Wished patient the best of luck with  his upcoming surgery and subsequent recovery. He was encouraged to return call to the PAT clinic, or to his surgeon's office, should any questions or concerns arise between now and the time of his surgery. Patient was appreciative of the care/concern expressed by PAT staff.   Encounter Diagnoses  Name Primary?   Pre-operative laboratory examination Yes   Diuretic-induced hypokalemia    Long term current use of diuretic    Dayvin Aber, MSN, APRN, FNP-C, CEN [facility]  Perioperative Services Nurse Practitioner Phone: [phone] 03/21/24 1:35 PM  NOTE: This note has been prepared using Dragon dictation software. Despite my best ability to proofread, there is always the potential that unintentional transcriptional errors may still occur from this process.

## 2024-03-23 ENCOUNTER — Other Ambulatory Visit: Payer: Self-pay | Admitting: Physician Assistant

## 2024-03-28 ENCOUNTER — Other Ambulatory Visit: Payer: Self-pay

## 2024-03-28 ENCOUNTER — Ambulatory Visit: Payer: Self-pay | Admitting: Urgent Care

## 2024-03-28 ENCOUNTER — Observation Stay
Admission: RE | Admit: 2024-03-28 | Discharge: 2024-03-29 | Disposition: A | Discharge status: Home Health | Payer: Self-pay | Source: Ambulatory Visit | Attending: Orthopedic Surgery | Admitting: Orthopedic Surgery

## 2024-03-28 ENCOUNTER — Encounter: Payer: Self-pay | Admitting: Orthopedic Surgery

## 2024-03-28 ENCOUNTER — Ambulatory Visit

## 2024-03-28 ENCOUNTER — Encounter
Admission: RE | Disposition: A | Discharge status: Home Health | Payer: Self-pay | Source: Ambulatory Visit | Attending: Orthopedic Surgery

## 2024-03-28 DIAGNOSIS — E876 Hypokalemia: Secondary | ICD-10-CM

## 2024-03-28 DIAGNOSIS — Z96651 Presence of right artificial knee joint: Secondary | ICD-10-CM

## 2024-03-28 DIAGNOSIS — I10 Essential (primary) hypertension: Secondary | ICD-10-CM | POA: Insufficient documentation

## 2024-03-28 DIAGNOSIS — Z01812 Encounter for preprocedural laboratory examination: Secondary | ICD-10-CM

## 2024-03-28 DIAGNOSIS — Z7982 Long term (current) use of aspirin: Secondary | ICD-10-CM | POA: Insufficient documentation

## 2024-03-28 DIAGNOSIS — Z79899 Other long term (current) drug therapy: Secondary | ICD-10-CM | POA: Insufficient documentation

## 2024-03-28 DIAGNOSIS — M1711 Unilateral primary osteoarthritis, right knee: Principal | ICD-10-CM | POA: Insufficient documentation

## 2024-03-28 DIAGNOSIS — R6882 Decreased libido: Secondary | ICD-10-CM

## 2024-03-28 HISTORY — DX: Sepsis, unspecified organism: A41.9

## 2024-03-28 HISTORY — DX: Long term (current) use of aspirin: Z79.82

## 2024-03-28 HISTORY — PX: TOTAL KNEE ARTHROPLASTY: SHX125

## 2024-03-28 HISTORY — DX: Depression, unspecified: F32.A

## 2024-03-28 HISTORY — DX: Unspecified hemorrhoids: K64.9

## 2024-03-28 HISTORY — DX: Duodenal ulcer, unspecified as acute or chronic, without hemorrhage or perforation: K26.9

## 2024-03-28 HISTORY — DX: Anesthesia of skin: R20.0

## 2024-03-28 HISTORY — DX: Unilateral primary osteoarthritis, right knee: M17.11

## 2024-03-28 HISTORY — DX: Obstructive sleep apnea (adult) (pediatric): G47.33

## 2024-03-28 LAB — POCT I-STAT, CHEM 8
BUN: 26 mg/dL — ABNORMAL HIGH (ref 8–23)
Calcium, Ion: 1.19 mmol/L (ref 1.15–1.40)
Chloride: 105 mmol/L (ref 98–111)
Creatinine, Ser: 1 mg/dL (ref 0.61–1.24)
Glucose, Bld: 107 mg/dL — ABNORMAL HIGH (ref 70–99)
HCT: 40 % (ref 39.0–52.0)
Hemoglobin: 13.6 g/dL (ref 13.0–17.0)
Potassium: 4 mmol/L (ref 3.5–5.1)
Sodium: 141 mmol/L (ref 135–145)
TCO2: 22 mmol/L (ref 22–32)

## 2024-03-28 SURGERY — ARTHROPLASTY, KNEE, TOTAL
Anesthesia: Spinal | Site: Knee | Laterality: Right

## 2024-03-28 MED ORDER — PHENYLEPHRINE 80 MCG/ML (10ML) SYRINGE FOR IV PUSH (FOR BLOOD PRESSURE SUPPORT)
PREFILLED_SYRINGE | INTRAVENOUS | Status: AC
  Filled 2024-03-28: qty 10

## 2024-03-28 MED ORDER — PROPOFOL 1000 MG/100ML IV EMUL
INTRAVENOUS | Status: AC
  Filled 2024-03-28: qty 100

## 2024-03-28 MED ORDER — DOCUSATE SODIUM 100 MG PO CAPS
100.0000 mg | ORAL_CAPSULE | Freq: Two times a day (BID) | ORAL | Status: DC
  Administered 2024-03-28 – 2024-03-29 (×2): 100 mg via ORAL
  Filled 2024-03-28 (×3): qty 1

## 2024-03-28 MED ORDER — DROPERIDOL 2.5 MG/ML IJ SOLN
0.6250 mg | Freq: Once | INTRAMUSCULAR | Status: AC | PRN
  Administered 2024-03-28: 0.625 mg via INTRAVENOUS

## 2024-03-28 MED ORDER — DEXAMETHASONE SODIUM PHOSPHATE 10 MG/ML IJ SOLN
INTRAMUSCULAR | Status: DC | PRN
  Administered 2024-03-28: 8 mg via INTRAVENOUS

## 2024-03-28 MED ORDER — TRANEXAMIC ACID-NACL 1000-0.7 MG/100ML-% IV SOLN
1000.0000 mg | INTRAVENOUS | Status: AC
  Administered 2024-03-28 (×3): 1000 mg via INTRAVENOUS

## 2024-03-28 MED ORDER — PHENYLEPHRINE 80 MCG/ML (10ML) SYRINGE FOR IV PUSH (FOR BLOOD PRESSURE SUPPORT)
PREFILLED_SYRINGE | INTRAVENOUS | Status: DC | PRN
  Administered 2024-03-28 (×3): 80 ug via INTRAVENOUS

## 2024-03-28 MED ORDER — FAMOTIDINE 20 MG PO TABS
20.0000 mg | ORAL_TABLET | Freq: Three times a day (TID) | ORAL | Status: DC
  Administered 2024-03-28 – 2024-03-29 (×3): 20 mg via ORAL
  Filled 2024-03-28 (×3): qty 1

## 2024-03-28 MED ORDER — BUPIVACAINE HCL (PF) 0.5 % IJ SOLN
INTRAMUSCULAR | Status: AC
Start: 2024-03-28 — End: 2024-03-28
  Filled 2024-03-28: qty 10

## 2024-03-28 MED ORDER — KETOROLAC TROMETHAMINE 30 MG/ML IJ SOLN
INTRAMUSCULAR | Status: AC
Start: 2024-03-28 — End: 2024-03-28
  Filled 2024-03-28: qty 1

## 2024-03-28 MED ORDER — DROPERIDOL 2.5 MG/ML IJ SOLN
INTRAMUSCULAR | Status: AC
  Filled 2024-03-28: qty 2

## 2024-03-28 MED ORDER — IPRATROPIUM BROMIDE 0.06 % NA SOLN
2.0000 | Freq: Three times a day (TID) | NASAL | Status: DC
  Administered 2024-03-28: 2 via NASAL
  Filled 2024-03-28: qty 15

## 2024-03-28 MED ORDER — BUPIVACAINE LIPOSOME 1.3 % IJ SUSP
INTRAMUSCULAR | Status: AC
  Filled 2024-03-28: qty 40

## 2024-03-28 MED ORDER — PROPOFOL 500 MG/50ML IV EMUL
INTRAVENOUS | Status: DC | PRN
  Administered 2024-03-28: 50 ug/kg/min via INTRAVENOUS

## 2024-03-28 MED ORDER — FENTANYL CITRATE (PF) 100 MCG/2ML IJ SOLN: 25.0000 ug | INTRAMUSCULAR | Status: DC | PRN

## 2024-03-28 MED ORDER — FENTANYL CITRATE (PF) 100 MCG/2ML IJ SOLN
INTRAMUSCULAR | Status: DC | PRN
  Administered 2024-03-28: 25 ug via INTRAVENOUS

## 2024-03-28 MED ORDER — AMLODIPINE BESYLATE 5 MG PO TABS
10.0000 mg | ORAL_TABLET | Freq: Every day | ORAL | Status: DC
  Administered 2024-03-29: 10 mg via ORAL

## 2024-03-28 MED ORDER — AZELASTINE HCL 0.1 % NA SOLN
1.0000 | Freq: Two times a day (BID) | NASAL | Status: DC
  Filled 2024-03-28: qty 30

## 2024-03-28 MED ORDER — CLONIDINE HCL 0.1 MG PO TABS
0.2000 mg | ORAL_TABLET | Freq: Two times a day (BID) | ORAL | Status: DC
  Administered 2024-03-28 – 2024-03-29 (×2): 0.2 mg via ORAL
  Filled 2024-03-28 (×3): qty 2

## 2024-03-28 MED ORDER — SODIUM CHLORIDE 0.9 % IV SOLN: INTRAVENOUS | Status: DC

## 2024-03-28 MED ORDER — KETOROLAC TROMETHAMINE 15 MG/ML IJ SOLN
7.5000 mg | Freq: Four times a day (QID) | INTRAMUSCULAR | Status: AC
  Administered 2024-03-28 – 2024-03-29 (×4): 7.5 mg via INTRAVENOUS
  Filled 2024-03-28 (×4): qty 1

## 2024-03-28 MED ORDER — ASPIRIN 81 MG PO CHEW
81.0000 mg | CHEWABLE_TABLET | Freq: Every day | ORAL | Status: DC
  Administered 2024-03-29: 81 mg via ORAL
  Filled 2024-03-28: qty 1

## 2024-03-28 MED ORDER — LOSARTAN POTASSIUM 50 MG PO TABS
100.0000 mg | ORAL_TABLET | Freq: Every day | ORAL | Status: DC
  Administered 2024-03-28 – 2024-03-29 (×2): 100 mg via ORAL
  Filled 2024-03-28 (×2): qty 2

## 2024-03-28 MED ORDER — PHENOL 1.4 % MT LIQD: 1.0000 | OROMUCOSAL | Status: DC | PRN

## 2024-03-28 MED ORDER — CHLORHEXIDINE GLUCONATE 0.12 % MT SOLN
OROMUCOSAL | Status: AC
  Filled 2024-03-28: qty 15

## 2024-03-28 MED ORDER — MORPHINE SULFATE (PF) 4 MG/ML IV SOLN: 0.5000 mg | INTRAVENOUS | Status: DC | PRN

## 2024-03-28 MED ORDER — ATORVASTATIN CALCIUM 20 MG PO TABS
40.0000 mg | ORAL_TABLET | Freq: Every day | ORAL | Status: DC
  Administered 2024-03-29: 40 mg via ORAL
  Filled 2024-03-28: qty 4

## 2024-03-28 MED ORDER — ONDANSETRON HCL 4 MG/2ML IJ SOLN
4.0000 mg | Freq: Four times a day (QID) | INTRAMUSCULAR | Status: DC | PRN
  Administered 2024-03-29: 4 mg via INTRAVENOUS
  Filled 2024-03-28: qty 2

## 2024-03-28 MED ORDER — PHENYLEPHRINE HCL-NACL 20-0.9 MG/250ML-% IV SOLN
INTRAVENOUS | Status: DC | PRN
  Administered 2024-03-28: 30 ug/min via INTRAVENOUS

## 2024-03-28 MED ORDER — PANTOPRAZOLE SODIUM 40 MG PO TBEC
40.0000 mg | DELAYED_RELEASE_TABLET | Freq: Every day | ORAL | Status: DC
  Administered 2024-03-28 – 2024-03-29 (×2): 40 mg via ORAL
  Filled 2024-03-28 (×2): qty 1

## 2024-03-28 MED ORDER — BUPIVACAINE HCL (PF) 0.5 % IJ SOLN
INTRAMUSCULAR | Status: DC | PRN
  Administered 2024-03-28: 3 mL

## 2024-03-28 MED ORDER — ONDANSETRON HCL 4 MG PO TABS: 4.0000 mg | ORAL_TABLET | Freq: Four times a day (QID) | ORAL | Status: DC | PRN

## 2024-03-28 MED ORDER — METOCLOPRAMIDE HCL 5 MG PO TABS: 5.0000 mg | ORAL_TABLET | Freq: Three times a day (TID) | ORAL | Status: DC | PRN

## 2024-03-28 MED ORDER — CEFAZOLIN SODIUM-DEXTROSE 3-4 GM/150ML-% IV SOLN
3.0000 g | INTRAVENOUS | Status: AC
Start: 2024-03-28 — End: 2024-03-28
  Administered 2024-03-28: 3 g via INTRAVENOUS
  Filled 2024-03-28: qty 150

## 2024-03-28 MED ORDER — BUPIVACAINE-EPINEPHRINE (PF) 0.25% -1:200000 IJ SOLN
INTRAMUSCULAR | Status: AC
Start: 2024-03-28 — End: 2024-03-28
  Filled 2024-03-28: qty 60

## 2024-03-28 MED ORDER — HYDROCODONE-ACETAMINOPHEN 5-325 MG PO TABS: 1.0000 | ORAL_TABLET | ORAL | Status: DC | PRN

## 2024-03-28 MED ORDER — FENTANYL CITRATE (PF) 100 MCG/2ML IJ SOLN
INTRAMUSCULAR | Status: AC
  Filled 2024-03-28: qty 2

## 2024-03-28 MED ORDER — METOCLOPRAMIDE HCL 5 MG/ML IJ SOLN: 5.0000 mg | Freq: Three times a day (TID) | INTRAMUSCULAR | Status: DC | PRN

## 2024-03-28 MED ORDER — HYDRALAZINE HCL 50 MG PO TABS
ORAL_TABLET | ORAL | Status: AC
  Filled 2024-03-28: qty 2

## 2024-03-28 MED ORDER — PROPOFOL 10 MG/ML IV BOLUS
INTRAVENOUS | Status: AC
Start: 2024-03-28 — End: 2024-03-28
  Filled 2024-03-28: qty 20

## 2024-03-28 MED ORDER — EPHEDRINE 5 MG/ML INJ
INTRAVENOUS | Status: AC
  Filled 2024-03-28: qty 5

## 2024-03-28 MED ORDER — LIDOCAINE HCL (PF) 2 % IJ SOLN
INTRAMUSCULAR | Status: AC
  Filled 2024-03-28: qty 5

## 2024-03-28 MED ORDER — CHLORTHALIDONE 25 MG PO TABS
25.0000 mg | ORAL_TABLET | Freq: Every day | ORAL | Status: DC
  Administered 2024-03-29: 25 mg via ORAL
  Filled 2024-03-28: qty 1

## 2024-03-28 MED ORDER — HYDRALAZINE HCL 50 MG PO TABS
100.0000 mg | ORAL_TABLET | Freq: Three times a day (TID) | ORAL | Status: DC
  Administered 2024-03-28 – 2024-03-29 (×3): 100 mg via ORAL

## 2024-03-28 MED ORDER — ENOXAPARIN SODIUM 30 MG/0.3ML IJ SOSY
30.0000 mg | PREFILLED_SYRINGE | Freq: Two times a day (BID) | INTRAMUSCULAR | Status: DC
  Administered 2024-03-29: 30 mg via SUBCUTANEOUS
  Filled 2024-03-28: qty 0.3

## 2024-03-28 MED ORDER — SODIUM CHLORIDE (PF) 0.9 % IJ SOLN
INTRAMUSCULAR | Status: AC
Start: 2024-03-28 — End: 2024-03-28
  Filled 2024-03-28: qty 10

## 2024-03-28 MED ORDER — ONDANSETRON HCL 4 MG/2ML IJ SOLN
INTRAMUSCULAR | Status: AC
  Filled 2024-03-28: qty 2

## 2024-03-28 MED ORDER — METOPROLOL TARTRATE 25 MG PO TABS
50.0000 mg | ORAL_TABLET | Freq: Two times a day (BID) | ORAL | Status: DC
  Administered 2024-03-28 – 2024-03-29 (×2): 50 mg via ORAL
  Filled 2024-03-28 (×3): qty 2

## 2024-03-28 MED ORDER — SURGIPHOR WOUND IRRIGATION SYSTEM - OPTIME: TOPICAL | Status: DC | PRN

## 2024-03-28 MED ORDER — PROPOFOL 1000 MG/100ML IV EMUL
INTRAVENOUS | Status: AC
Start: 2024-03-28 — End: 2024-03-28
  Filled 2024-03-28: qty 100

## 2024-03-28 MED ORDER — TRANEXAMIC ACID-NACL 1000-0.7 MG/100ML-% IV SOLN
INTRAVENOUS | Status: AC
  Filled 2024-03-28: qty 100

## 2024-03-28 MED ORDER — LAMOTRIGINE 100 MG PO TABS
200.0000 mg | ORAL_TABLET | Freq: Two times a day (BID) | ORAL | Status: DC
  Administered 2024-03-28 – 2024-03-29 (×2): 200 mg via ORAL
  Filled 2024-03-28 (×2): qty 2
  Filled 2024-03-28 (×3): qty 8

## 2024-03-28 MED ORDER — CHLORHEXIDINE GLUCONATE 0.12 % MT SOLN
15.0000 mL | Freq: Once | OROMUCOSAL | Status: AC
  Administered 2024-03-28: 15 mL via OROMUCOSAL

## 2024-03-28 MED ORDER — SODIUM CHLORIDE 0.9 % IR SOLN
Status: DC | PRN
  Administered 2024-03-28: 3000 mL

## 2024-03-28 MED ORDER — SODIUM CHLORIDE (PF) 0.9 % IJ SOLN
INTRAMUSCULAR | Status: AC
Start: 2024-03-28 — End: 2024-03-28
  Filled 2024-03-28: qty 20

## 2024-03-28 MED ORDER — ACETAMINOPHEN 325 MG PO TABS: 325.0000 mg | ORAL_TABLET | Freq: Four times a day (QID) | ORAL | Status: DC | PRN

## 2024-03-28 MED ORDER — MIDAZOLAM HCL 2 MG/2ML IJ SOLN
INTRAMUSCULAR | Status: AC
  Filled 2024-03-28: qty 2

## 2024-03-28 MED ORDER — DEXAMETHASONE SODIUM PHOSPHATE 10 MG/ML IJ SOLN
INTRAMUSCULAR | Status: AC
Start: 2024-03-28 — End: 2024-03-28
  Filled 2024-03-28: qty 1

## 2024-03-28 MED ORDER — SODIUM CHLORIDE (PF) 0.9 % IJ SOLN
INTRAMUSCULAR | Status: DC | PRN
  Administered 2024-03-28: 71 mL via INTRAMUSCULAR

## 2024-03-28 MED ORDER — ACETAMINOPHEN 500 MG PO TABS
1000.0000 mg | ORAL_TABLET | Freq: Three times a day (TID) | ORAL | Status: DC
  Administered 2024-03-28 – 2024-03-29 (×2): 1000 mg via ORAL
  Filled 2024-03-28 (×3): qty 2

## 2024-03-28 MED ORDER — MIDAZOLAM HCL 5 MG/5ML IJ SOLN
INTRAMUSCULAR | Status: DC | PRN
  Administered 2024-03-28: 2 mg via INTRAVENOUS

## 2024-03-28 MED ORDER — CARBAMAZEPINE 200 MG PO TABS
400.0000 mg | ORAL_TABLET | Freq: Every day | ORAL | Status: DC
  Administered 2024-03-28: 400 mg via ORAL
  Filled 2024-03-28: qty 2

## 2024-03-28 MED ORDER — TRAMADOL HCL 50 MG PO TABS
50.0000 mg | ORAL_TABLET | Freq: Four times a day (QID) | ORAL | Status: DC | PRN
  Administered 2024-03-28 – 2024-03-29 (×2): 50 mg via ORAL
  Filled 2024-03-28 (×2): qty 1

## 2024-03-28 MED ORDER — CEFAZOLIN SODIUM-DEXTROSE 3-4 GM/150ML-% IV SOLN
3.0000 g | Freq: Four times a day (QID) | INTRAVENOUS | Status: AC
  Administered 2024-03-28 (×2): 3 g via INTRAVENOUS
  Filled 2024-03-28 (×2): qty 150

## 2024-03-28 MED ORDER — MONTELUKAST SODIUM 10 MG PO TABS
10.0000 mg | ORAL_TABLET | Freq: Every morning | ORAL | Status: DC
  Administered 2024-03-29: 10 mg via ORAL
  Filled 2024-03-28: qty 1

## 2024-03-28 MED ORDER — ORAL CARE MOUTH RINSE: 15.0000 mL | Freq: Once | OROMUCOSAL | Status: AC

## 2024-03-28 MED ORDER — LATANOPROST 0.005 % OP SOLN
1.0000 [drp] | Freq: Every day | OPHTHALMIC | Status: DC
  Filled 2024-03-28: qty 2.5

## 2024-03-28 MED ORDER — EPHEDRINE SULFATE-NACL 50-0.9 MG/10ML-% IV SOSY
PREFILLED_SYRINGE | INTRAVENOUS | Status: DC | PRN
  Administered 2024-03-28 (×3): 5 mg via INTRAVENOUS

## 2024-03-28 MED ORDER — LACTATED RINGERS IV SOLN: INTRAVENOUS | Status: DC

## 2024-03-28 MED ORDER — CARBAMAZEPINE 200 MG PO TABS
600.0000 mg | ORAL_TABLET | Freq: Every day | ORAL | Status: DC
  Administered 2024-03-29: 600 mg via ORAL
  Filled 2024-03-28: qty 3

## 2024-03-28 MED ORDER — DEXAMETHASONE SODIUM PHOSPHATE 10 MG/ML IJ SOLN: 8.0000 mg | Freq: Once | INTRAMUSCULAR | Status: DC

## 2024-03-28 MED ORDER — MENTHOL 3 MG MT LOZG: 1.0000 | LOZENGE | OROMUCOSAL | Status: DC | PRN

## 2024-03-28 SURGICAL SUPPLY — 57 items
BLADE PATELLA W-PILOT HOLE 35 (BLADE) IMPLANT
BLADE SAW 90X13X1.19 OSCILLAT (BLADE) IMPLANT
BLADE SAW SAG 25X90X1.19 (BLADE) ×1 IMPLANT
BLADE SAW SAG 29X58X.64 (BLADE) ×1 IMPLANT
BNDG ELASTIC 6INX 5YD STR LF (GAUZE/BANDAGES/DRESSINGS) ×1 IMPLANT
BOWL CEMENT MIX W/ADAPTER (MISCELLANEOUS) IMPLANT
BRUSH SCRUB EZ PLAIN DRY (MISCELLANEOUS) IMPLANT
CHLORAPREP W/TINT 26 (MISCELLANEOUS) ×2 IMPLANT
COMPONENT FEM CMT PS STD 11 RT (Joint) IMPLANT
COMPONENT PATELLA 3 PEG 35 (Joint) IMPLANT
COMPONENT TIB KNEE PS G 0 RT (Joint) IMPLANT
COOLER ICEMAN CLASSIC (MISCELLANEOUS) ×1 IMPLANT
CUFF TRNQT CYL 24X4X16.5-23 (TOURNIQUET CUFF) IMPLANT
CUFF TRNQT CYL 30X4X21-28X (TOURNIQUET CUFF) IMPLANT
DERMABOND ADVANCED .7 DNX12 (GAUZE/BANDAGES/DRESSINGS) ×1 IMPLANT
DRAPE SHEET LG 3/4 BI-LAMINATE (DRAPES) ×2 IMPLANT
DRSG MEPILEX SACRM 8.7X9.8 (GAUZE/BANDAGES/DRESSINGS) ×1 IMPLANT
DRSG OPSITE POSTOP 4X10 (GAUZE/BANDAGES/DRESSINGS) IMPLANT
DRSG OPSITE POSTOP 4X8 (GAUZE/BANDAGES/DRESSINGS) IMPLANT
ELECTRODE REM PT RTRN 9FT ADLT (ELECTROSURGICAL) ×1 IMPLANT
GLOVE BIO SURGEON STRL SZ8 (GLOVE) ×1 IMPLANT
GLOVE BIOGEL PI IND STRL 8 (GLOVE) ×1 IMPLANT
GLOVE PI ORTHO PRO STRL 7.5 (GLOVE) ×2 IMPLANT
GLOVE PI ORTHO PRO STRL SZ8 (GLOVE) ×2 IMPLANT
GLOVE SURG SYN 7.5 PF PI (GLOVE) ×1 IMPLANT
GOWN SRG XL LVL 3 NONREINFORCE (GOWNS) ×1 IMPLANT
GOWN STRL REUS W/ TWL LRG LVL3 (GOWN DISPOSABLE) ×1 IMPLANT
GOWN STRL REUS W/ TWL XL LVL3 (GOWN DISPOSABLE) ×1 IMPLANT
HOOD PEEL AWAY T7 (MISCELLANEOUS) ×2 IMPLANT
INSERT TIBIAL PERSONA 10 RT (Insert) IMPLANT
KIT TURNOVER KIT A (KITS) ×1 IMPLANT
MANIFOLD NEPTUNE II (INSTRUMENTS) ×1 IMPLANT
MARKER SKIN DUAL TIP RULER LAB (MISCELLANEOUS) ×1 IMPLANT
MAT ABSORB FLUID 56X50 GRAY (MISCELLANEOUS) ×1 IMPLANT
NDL HYPO 21X1.5 SAFETY (NEEDLE) ×1 IMPLANT
NEEDLE HYPO 21X1.5 SAFETY (NEEDLE) ×1 IMPLANT
PACK TOTAL KNEE (MISCELLANEOUS) ×1 IMPLANT
PAD ARMBOARD POSITIONER FOAM (MISCELLANEOUS) ×3 IMPLANT
PAD COLD UNI WRAP-ON (PAD) ×1 IMPLANT
PENCIL SMOKE EVACUATOR (MISCELLANEOUS) ×1 IMPLANT
PIN DRILL HDLS TROCAR 75 4PK (PIN) IMPLANT
SCREW FEMALE HEX FIX 25X2.5 (ORTHOPEDIC DISPOSABLE SUPPLIES) IMPLANT
SCREW HEX HEADED 3.5X27 DISP (ORTHOPEDIC DISPOSABLE SUPPLIES) IMPLANT
SLEEVE SCD COMPRESS KNEE MED (STOCKING) ×1 IMPLANT
SOL .9 NS 3000ML IRR UROMATIC (IV SOLUTION) ×1 IMPLANT
SOLUTION IRRIG SURGIPHOR (IV SOLUTION) ×1 IMPLANT
STOCKINETTE IMPERV 14X48 (MISCELLANEOUS) ×1 IMPLANT
SUT STRATAFIX 14 PDO 36 VLT (SUTURE) ×1 IMPLANT
SUT VIC AB 0 CT1 36 (SUTURE) ×1 IMPLANT
SUT VIC AB 2-0 CT2 27 (SUTURE) ×2 IMPLANT
SUTURE STRATA SPIR 4-0 18 (SUTURE) ×1 IMPLANT
SUTURE VICRYL 1-0 27IN ABS (SUTURE) ×1 IMPLANT
SYR 20ML LL LF (SYRINGE) ×2 IMPLANT
TAPE CLOTH 3X10 WHT NS LF (GAUZE/BANDAGES/DRESSINGS) ×1 IMPLANT
TIP FAN IRRIG PULSAVAC PLUS (DISPOSABLE) ×1 IMPLANT
TRAP FLUID SMOKE EVACUATOR (MISCELLANEOUS) ×1 IMPLANT
WATER STERILE IRR 1000ML POUR (IV SOLUTION) ×1 IMPLANT

## 2024-03-28 NOTE — Transfer of Care (Signed)
 Immediate Anesthesia Transfer of Care Note  Patient: Peter [name]  Procedure(s) Performed: ARTHROPLASTY, KNEE, TOTAL (Right: Knee)  Patient Location: PACU  Anesthesia Type:General and Spinal  Level of Consciousness: awake  Airway & Oxygen Therapy: Patient Spontanous Breathing  Post-op Assessment: Report given to RN and Post -op Vital signs reviewed and stable  Post vital signs: Reviewed and stable  Last Vitals:  Vitals Value Taken Time  BP 121/66 03/28/24 10:45  Temp 36.3 C 03/28/24 10:30  Pulse 48 03/28/24 10:51  Resp 17 03/28/24 10:51  SpO2 96 % 03/28/24 10:51  Vitals shown include unfiled device data.  Last Pain:  Vitals:   03/28/24 1045  PainSc: 0-No pain         Complications: There were no known notable events for this encounter.

## 2024-03-28 NOTE — Evaluation (Signed)
 Physical Therapy Evaluation Patient Details Name: Peter [name] MRN: [id number] DOB: 1960/08/11 Today's Date: 03/28/2024  History of Present Illness  Patient is a 63 year old male with right knee osteoarthritis s/p right TKA. PMH: sleep apnea, depression, bipolar disorder, HTN, obesity, dyspnea  Clinical Impression  Patient is agreeable to PT evaluation. He is independent without assistive device at baseline. He lives with his spouse.  Mobility initiated today. Patient ambulated in hallway with rolling walker with no reported increased pain and improving gait pattern with increased ambulation distance. He is hopeful for discharge home tomorrow with family support. PT will continue to follow to maximize independence and decrease caregiver burden.       If plan is discharge home, recommend the following: Assist for transportation;Help with stairs or ramp for entrance   Can travel by private vehicle        Equipment Recommendations Rolling walker (2 wheels);BSC/3in1 (in the room already)  Recommendations for Other Services       Functional Status Assessment Patient has had a recent decline in their functional status and demonstrates the ability to make significant improvements in function in a reasonable and predictable amount of time.     Precautions / Restrictions Precautions Precautions: Knee;Fall Precaution Booklet Issued: Yes (comment) Recall of Precautions/Restrictions: Intact Restrictions Weight Bearing Restrictions Per Provider Order: Yes RLE Weight Bearing Per Provider Order: Weight bearing as tolerated      Mobility  Bed Mobility Overal bed mobility: Modified Independent                  Transfers Overall transfer level: Needs assistance Equipment used: Rolling walker (2 wheels) Transfers: Sit to/from Stand Sit to Stand: Supervision           General transfer comment: cues for hand placement and RLE positioning    Ambulation/Gait Ambulation/Gait  assistance: Contact guard assist Gait Distance (Feet): 75 Feet Assistive device: Rolling walker (2 wheels) Gait Pattern/deviations: Step-to pattern, Step-through pattern, Decreased stance time - right Gait velocity: decreased     General Gait Details: step to progressing to step through gait pattern  Stairs            Wheelchair Mobility     Tilt Bed    Modified Rankin (Stroke Patients Only)       Balance Overall balance assessment: Needs assistance Sitting-balance support: Feet supported Sitting balance-Leahy Scale: Good     Standing balance support: Bilateral upper extremity supported, Single extremity supported Standing balance-Leahy Scale: Fair                               Pertinent Vitals/Pain Pain Assessment Pain Assessment: Faces Faces Pain Scale: Hurts a little bit Pain Location: right knee with movement Pain Descriptors / Indicators: Discomfort Pain Intervention(s): Limited activity within patient's tolerance, Monitored during session, Repositioned (polar care encouraged)    Home Living Family/patient expects to be discharged to:: Private residence Living Arrangements: Spouse/significant other Available Help at Discharge: Family;Available 24 hours/day Type of Home: House Home Access: Stairs to enter Entrance Stairs-Rails: Right Entrance Stairs-Number of Steps: 3   Home Layout: One level Home Equipment: None      Prior Function Prior Level of Function : Independent/Modified Independent             Mobility Comments: independent without device. retired       Extremity/Trunk Assessment   Upper Extremity Assessment Upper Extremity Assessment: Overall WFL for tasks assessed  Lower Extremity Assessment Lower Extremity Assessment: RLE deficits/detail RLE Deficits / Details: able to SLR independently. weight bearing without knee buckling RLE Sensation: WNL       Communication   Communication Communication: No apparent  difficulties    Cognition Arousal: Alert Behavior During Therapy: WFL for tasks assessed/performed   PT - Cognitive impairments: No apparent impairments                         Following commands: Intact       Cueing Cueing Techniques: Verbal cues     General Comments General comments (skin integrity, edema, etc.): education on positioning to promote knee ROM    Exercises Total Joint Exercises Goniometric ROM: right knee flexion 80 degrees   Assessment/Plan    PT Assessment Patient needs continued PT services  PT Problem List Decreased strength;Decreased range of motion;Decreased activity tolerance;Decreased balance;Decreased mobility       PT Treatment Interventions DME instruction;Gait training;Stair training;Functional mobility training;Therapeutic activities;Therapeutic exercise;Patient/family education;Balance training    PT Goals (Current goals can be found in the Care Plan section)  Acute Rehab PT Goals Patient Stated Goal: to go home PT Goal Formulation: With patient Time For Goal Achievement: 04/11/24 Potential to Achieve Goals: Good    Frequency BID     Co-evaluation               AM-PAC PT 6 Clicks Mobility  Outcome Measure Help needed turning from your back to your side while in a flat bed without using bedrails?: None Help needed moving from lying on your back to sitting on the side of a flat bed without using bedrails?: A Little Help needed moving to and from a bed to a chair (including a wheelchair)?: A Little Help needed standing up from a chair using your arms (e.g., wheelchair or bedside chair)?: A Little Help needed to walk in hospital room?: A Little Help needed climbing 3-5 steps with a railing? : A Little 6 Click Score: 19    End of Session Equipment Utilized During Treatment: Gait belt Activity Tolerance: Patient tolerated treatment well Patient left: in bed;with call bell/phone within reach;with SCD's reapplied (polar  care RLE, SCD LLE) Nurse Communication: Mobility status (DME has already been delivered to room) PT Visit Diagnosis: Difficulty in walking, not elsewhere classified (R26.2);Other abnormalities of gait and mobility (R26.89)    Time: 8495-8473 PT Time Calculation (min) (ACUTE ONLY): 22 min   Charges:   PT Evaluation $PT Eval Low Complexity: 1 Low PT Treatments $Therapeutic Activity: 8-22 mins PT General Charges $$ ACUTE PT VISIT: 1 Visit         Randine Essex, PT, MPT  Randine LULLA Essex 03/28/2024, 3:37 PM

## 2024-03-28 NOTE — Interval H&P Note (Signed)
Patient history and physical updated. Consent reviewed including risks, benefits, and alternatives to surgery. Patient agrees with above plan to proceed with right total knee arthroplasty  

## 2024-03-28 NOTE — Plan of Care (Signed)
   Problem: Pain Management: Goal: Pain level will decrease with appropriate interventions Outcome: Progressing

## 2024-03-28 NOTE — H&P (Signed)
 History of Present Illness: The patient is an 63 y.o. male seen in clinic today for follow-up evaluation of his right knee. Patient has known severe degenerative changes in his right knee and has been undergoing conservative treatments with injections home exercises, weight loss, and pain medications. Patient has continued to lose weight and is lost another 9 pounds since his last visit. He reports he got a few months of relief out of his last shot but the pain came back and was more severe with pain today up to a 7 out of 10 which is limiting him in his ability to walk about his pain activities. He is here today to discuss potential for surgical intervention versus additional conservative treatments. The patient denies fevers, chills, numbness, tingling, shortness of breath, chest pain, recent illness, or any trauma.  Patient's BMI is 42 he is an A1c of 5.5 is a non-smoker  Past Medical History: Past Medical History:  Diagnosis Date  Bipolar affective disorder (CMS/HHS-HCC)  Glaucoma  Hypertension  OSA on CPAP   Past Surgical History: Past Surgical History:  Procedure Laterality Date  ESOPHAGOGASTRODOUDENOSCOPY W/CONTROL BLEEDING N/A 01/05/2017  Procedure: ESOPHAGOGASTRODUODENOSCOPY, FLEXIBLE, TRANSORAL; WITH CONTROL OF BLEEDING, ANY METHOD; Surgeon: Gladis Victory Mon, MD; Location[facility] ENDO/[facility]ON[facility]; Service: Gastroenterology; Laterality: N/A;  EGD N/A 01/06/2017  Procedure: EGD; Surgeon: Norleen Ricardo Merle Mickey., MD; Location: [facility] ENDO/[facility]ON[facility]; Service: Gastroenterology; Laterality: N/A;  COLONOSCOPY W/CONTROL BLEEDING 01/10/2017  Procedure: COLONOSCOPY, FLEXIBLE; WITH CONTROL OF BLEEDING, ANY METHOD; Surgeon: Alberteen Lynwood Osier, MD; Location: [facility] ENDO[facility]; Service: Gastroenterology;;  abdominal abcess  wound vac  APPENDECTOMY  CHOLECYSTECTOMY  CLOSURE TRACHEOSTOMY  KNEE ARTHROSCOPY Left  TONSILLECTOMY  TRACHEOSTOMY   Past Family History: Family History  Problem Relation Age of  Onset  No Known Problems Mother  No Known Problems Father   Medications: Current Outpatient Medications  Medication Sig Dispense Refill  ALPRAZolam  (XANAX ) 0.5 MG tablet Take 1 tablet by mouth 2 (two) times daily as needed  amLODIPine  (NORVASC ) 10 MG tablet Take 1 tablet (10 mg total) by mouth once daily 90 tablet 3  aspirin  81 MG EC tablet Take 81 mg by mouth once daily  atorvastatin  (LIPITOR) 40 MG tablet Take 1 tablet (40 mg total) by mouth once daily 90 tablet 3  azelastine  (ASTELIN ) 137 mcg nasal spray USE 1-2 SPRAYS IN EACH NOSTRIL TWICE A DAY FOR 30 DAYS  carBAMazepine  (TEGRETOL  XR) 400 MG XR tablet Take 1,000 mg by mouth once daily  chlorthalidone  25 MG tablet Take 1 tablet (25 mg total) by mouth once daily 90 tablet 3  cholecalciferol (VITAMIN D3) 1000 unit tablet Take 1 tablet (1,000 Units total) by mouth once daily 30 tablet 3  cloNIDine  HCL (CATAPRES ) 0.2 MG tablet Take 1 tablet (0.2 mg total) by mouth 2 (two) times daily 180 tablet 3  cyanocobalamin  (VITAMIN B12) 1000 MCG tablet Take 1,000 mcg by mouth once daily  fluticasone  propionate (FLONASE ) 50 mcg/actuation nasal spray Place into one nostril  hydrALAZINE  (APRESOLINE ) 100 MG tablet Take 1 tablet (100 mg total) by mouth 3 (three) times daily 270 tablet 3  lamoTRIgine  (LAMICTAL ) 200 MG tablet Take 1 tablet (200 mg total) by mouth 2 (two) times daily 180 tablet 3  latanoprost  (XALATAN ) 0.005 % ophthalmic solution Apply to eye.  levocetirizine (XYZAL) 5 MG tablet Take 5 mg by mouth every evening  losartan  (COZAAR ) 100 MG tablet Take 1 tablet (100 mg total) by mouth once daily 90 tablet 3  metoprolol  TARTrate (LOPRESSOR ) 100 MG tablet Take 0.5  tablets (50 mg total) by mouth 2 (two) times daily 180 tablet 3  montelukast  (SINGULAIR ) 10 mg tablet Take 10 mg by mouth once daily  omega-3 acid ethyl esters (LOVAZA ) 1 gram capsule Take 1,000 mg by mouth 3 (three) times a day  potassium gluconate 2.5 mEq Tab Take by mouth  predniSONE  (DELTASONE) 20 MG tablet Take 1 tablet (20 mg total) by mouth once daily (Patient not taking: Reported on 01/25/2024) 7 tablet 0  UNABLE TO FIND KYOLIC- 300 MG 1 TABLET THREE TIMES DAILY  UNABLE TO FIND Med Name: kyolic 300 mg three times a day   No current facility-administered medications for this visit.   Allergies: Allergies  Allergen Reactions  Oxycodone  Hallucination  Causes Hallucinations, auditory and tactile  Isosorbide  Nausea  Headache, nausea, vomiting    Visit Vitals: Vitals:  02/28/24 0842  BP: 126/88    Review of Systems:  A comprehensive 14 point ROS was performed, reviewed, and the pertinent orthopaedic findings are documented in the HPI.  Physical Exam: General/Constitutional: No apparent distress: well-nourished and well developed. Eyes: Pupils equal, round with synchronous movement. Pulmonary exam: Lungs clear to auscultation bilaterally no wheezing rales or rhonchi Cardiac exam: Regular rate and rhythm no obvious murmurs rubs or gallops. Integumentary: No impressive skin lesions present, except as noted in detailed exam. Neuro/Psych: Normal mood and affect, oriented to person, place and time.  Comprehensive Knee Exam: Gait Antalgic on the right  Alignment Neutral   Inspection Right  Skin Normal appearance with no obvious deformity. No ecchymosis or erythema.  Soft Tissue No focal soft tissue swelling  Quad Atrophy None   Palpation  Right  Tenderness Medial joint line and parapatellar tenderness to palpation  Crepitus + patellofemoral and tibiofemoral crepitus  Effusion None   Range of Motion Right  Flexion 0-105  Extension Full knee extension without hyperextension   Ligamentous Exam Right  Lachman Normal  Valgus 0 Normal  Valgus 30 Normal  Varus 0 Normal  Varus 30 Normal  Anterior Drawer Normal  Posterior Drawer Normal   Meniscal Exam Right  Hyperflexion Test Positive  Hyperextension Test Negative  McMurray's Positive    Neurovascular Right  Quadriceps Strength 5/5  Hamstring Strength 5/5  Hip Abductor Strength 4/5  Distal Motor Normal  Distal Sensory Normal light touch sensation  Distal Pulses Normal    Imaging Studies: I have reviewed AP, lateral,sunrise, and flexed PA weight bearing knee X-rays (4 views) of the right knee ordered and taken today in the office show severe degenerative changes with medial patellofemoral bone-on-bone articulation with osteophyte formation sclerosis and early subchondral cyst formation. Kellgren-Lawrence grade 3/4. There is lateral subluxation of the tibia relative to the femur. AP, sunrise, and flexed PA of the left knee also show status post total knee arthroplasty with patellar resurfacing. There is some mild lateral tibial overgrowth but otherwise components appear in good position with no evidence of periprosthetic fracture or loosening.   Assessment:  ICD-10-CM  1. Primary osteoarthritis of right knee M17.11    Plan: Darin RAMAN is a 63 year old male presents with right knee bone on bone arthritis. Based upon the patient's continued symptoms and failure to respond to conservative treatment, I have recommended a right total knee replacement for this patient. A long discussion took place with the patient describing what a total joint replacement is and what the procedure would entail. A knee model, similar to the implants that will be used during the operation, was utilized to demonstrate the implants.  Choices of implant manufactures were discussed and reviewed. The ability to secure the implant utilizing cement or cementless (press fit) fixation was discussed. The approach and exposure was discussed.   The hospitalization and post-operative care and rehabilitation were also discussed. The use of perioperative antibiotics and DVT prophylaxis were discussed. The risk, benefits and alternatives to a surgical intervention were discussed at length with the patient. The patient  was also advised of risks related to the medical comorbidities and elevated body mass index (BMI). A lengthy discussion took place to review the most common complications including but not limited to: stiffness, loss of function, complex regional pain syndrome, deep vein thrombosis, pulmonary embolus, heart attack, stroke, infection, wound breakdown, numbness, intraoperative fracture, damage to nerves, tendon,muscles, arteries or other blood vessels, death and other possible complications from anesthesia. The patient was told that we will take steps to minimize these risks by using sterile technique, antibiotics and DVT prophylaxis when appropriate and follow the patient postoperatively in the office setting to monitor progress. The possibility of recurrent pain, no improvement in pain and actual worsening of pain were also discussed with the patient.   Patient asked about and confirms no history of any reactions to metal or metal allergy in the past.  The discharge plan of care focused on the patient going home following surgery. The patient was encouraged to make the necessary arrangements to have someone stay with them when they are discharged home.   The benefits of surgery were discussed with the patient including the potential for improving the patient's current clinical condition through operative intervention. Alternatives to surgical intervention including continued conservative management were also discussed in detail. All questions were answered to the satisfaction of the patient. The patient participated and agreed to the plan of care as well as the use of the recommended implants for their total knee replacement surgery. An information packet was given to the patient to review prior to surgery.   The patient received clearance for surgery. All questions answered and agrees to the above plan for a right total knee replacement.  Portions of this record have been created using [employer]. Dictation errors have been sought, but may not have been identified and corrected.  Arthea Sheer MD

## 2024-03-28 NOTE — Anesthesia Preprocedure Evaluation (Signed)
 Anesthesia Evaluation  Patient identified by MRN, date of birth, ID band Patient awake    Reviewed: Allergy & Precautions, NPO status , Patient's Chart, lab work & pertinent test results  History of Anesthesia Complications (+) DIFFICULT AIRWAY and history of anesthetic complications (prev grade 2 view with glidescope 4, Difficult Airway- due to limited oral opening, large tongue and anterior larynx)  Airway Mallampati: III  TM Distance: >3 FB Neck ROM: full    Dental  (+) Chipped, Dental Advidsory Given, Poor Dentition   Pulmonary neg shortness of breath, sleep apnea and Continuous Positive Airway Pressure Ventilation , neg COPD, neg recent URI   Pulmonary exam normal        Cardiovascular hypertension, On Medications and On Home Beta Blockers (-) angina (-) Past MI and (-) Cardiac Stents Normal cardiovascular exam(-) dysrhythmias (-) Valvular Problems/Murmurs     Neuro/Psych  PSYCHIATRIC DISORDERS  Depression Bipolar Disorder   negative neurological ROS     GI/Hepatic Neg liver ROS, PUD,GERD  Medicated,,  Endo/Other    Class 3 obesity  Renal/GU negative Renal ROS  negative genitourinary   Musculoskeletal   Abdominal   Peds  Hematology negative hematology ROS (+)   Anesthesia Other Findings Past Medical History: No date: Bipolar disorder (HCC) No date: Blood in stool No date: Difficult intubation No date: Dyspnea No date: GERD (gastroesophageal reflux disease) No date: Glaucoma     Comment:  since 2004 No date: Hypertension     Comment:  since 2000 No date: Obesity, unspecified No date: Sleep apnea 2011: Ulcer     Comment:  gastric No date: Unspecified hemorrhoids without mention of complication  Past Surgical History: No date: APPENDECTOMY 11/16/2016: APPLICATION OF WOUND VAC; N/A     Comment:  Procedure: APPLICATION OF WOUND VAC-ABDOMINAL;  Surgeon:              Wonda Charlie BRAVO, MD;  Location: [facility]  ORS;  Service:               General;  Laterality: N/A; 11/18/2016: APPLICATION OF WOUND VAC; N/A     Comment:  Procedure: APPLICATION OF WOUND VAC;  Surgeon: Wonda Charlie BRAVO, MD;  Location: [facility] ORS;  Service: General;                Laterality: N/A;  change 11/23/2016: APPLICATION OF WOUND VAC; N/A     Comment:  Procedure: WOUND VAC CHANGE;  Surgeon: Desiderio Schanz,               MD;  Location: [facility] ORS;  Service: General;  Laterality:               N/A;  wound vac application 06/23/2016: CARDIAC CATHETERIZATION; Left     Comment:  Procedure: Left Heart Cath and Coronary Angiography;                Surgeon: Lonni Hanson, MD;  Location: [facility] INVASIVE CV              LAB;  Service: Cardiovascular;  Laterality: Left; 07/05/1990: CHOLECYSTECTOMY 05/22/2010: COLONOSCOPY W/ POLYPECTOMY     Comment:  colon polyp, traditional serrated               adenoma,negative for high grade dysplasia & malignancy.               rectal polyp-36mm,negative for dysplasia & malignancy. 06/20/2019: COLONOSCOPY WITH PROPOFOL ;  N/A     Comment:  Procedure: COLONOSCOPY WITH PROPOFOL ;  Surgeon: Unk Corinn Skiff, MD;  Location: [facility] ENDOSCOPY;  Service:               Gastroenterology;  Laterality: N/A; 06/21/2019: COLONOSCOPY WITH PROPOFOL ; N/A     Comment:  Procedure: COLONOSCOPY WITH PROPOFOL ;  Surgeon: Unk Corinn Skiff, MD;  Location: [facility] ENDOSCOPY;  Service:               Gastroenterology;  Laterality: N/A; 11/21/2016: DRESSING CHANGE UNDER ANESTHESIA; N/A     Comment:  Procedure: DRESSING CHANGE UNDER ANESTHESIA;  Surgeon:               Wonda Charlie BRAVO, MD;  Location: [facility] ORS;  Service:               General;  Laterality: N/A; 06/20/2019: ESOPHAGOGASTRODUODENOSCOPY (EGD) WITH PROPOFOL ; N/A     Comment:  Procedure: ESOPHAGOGASTRODUODENOSCOPY (EGD) WITH               PROPOFOL ;  Surgeon: Unk Corinn Skiff, MD;  Location:                [facility] ENDOSCOPY;  Service: Gastroenterology;  Laterality:               N/A; 07/05/2008: KNEE SURGERY; Left 10/30/2016: LAPAROSCOPIC APPENDECTOMY; N/A     Comment:  Procedure: APPENDECTOMY LAPAROSCOPIC changed  to open               application of wound vac;  Surgeon: Laneta JULIANNA Luna, MD;                Location: [facility] ORS;  Service: General;  Laterality: N/A; 11/11/2016: LAPAROTOMY; N/A     Comment:  Procedure: EXPLORATORY LAPAROTOMY, DEBRIDEMENT OF               ABDOMINAL WOUND, ABDOMINAL WASH OUT;  Surgeon: Luna Laneta JULIANNA, MD;  Location: [facility] ORS;  Service: General;                Laterality: N/A; 11/12/2016: LAPAROTOMY; N/A     Comment:  Procedure: EXPLORATORY LAPAROTOMY;  Surgeon: Shelva Dunnings, MD;  Location: [facility] ORS;  Service: General;                Laterality: N/A; 11/14/2016: LAPAROTOMY; N/A     Comment:  Procedure: EXPLORATORY LAPAROTOMY; Irrigation, partial               closure;  Surgeon: Shelva Dunnings, MD;  Location: [facility]               ORS;  Service: General;  Laterality: N/A; 03/21/2019: TOTAL KNEE ARTHROPLASTY; Left     Comment:  Procedure: TOTAL KNEE ARTHROPLASTY;  Surgeon: Leora Lynwood SAUNDERS, MD;  Location: [facility] ORS;  Service: Orthopedics;               Laterality: Left;  BMI    Body Mass Index: 46.45 kg/m      Reproductive/Obstetrics negative OB ROS  Anesthesia Physical Anesthesia Plan  ASA: 3  Anesthesia Plan: Spinal   Post-op Pain Management:    Induction: Intravenous  PONV Risk Score and Plan: TIVA and Propofol  infusion  Airway Management Planned: Natural Airway and Simple Face Mask  Additional Equipment:   Intra-op Plan:   Post-operative Plan:   Informed Consent: I have reviewed the patients History and Physical, chart, labs and discussed the procedure including the risks, benefits and alternatives for the proposed anesthesia with the patient or  authorized representative who has indicated his/her understanding and acceptance.     Dental Advisory Given  Plan Discussed with: Anesthesiologist, CRNA and Surgeon  Anesthesia Plan Comments: (Patient consented for risks of anesthesia including but not limited to:  - adverse reactions to medications - risk of airway placement if required - damage to eyes, teeth, lips or other oral mucosa - nerve damage due to positioning  - sore throat or hoarseness - Damage to heart, brain, nerves, lungs, other parts of body or loss of life  Patient voiced understanding.)         Anesthesia Quick Evaluation

## 2024-03-28 NOTE — Op Note (Signed)
 Patient Name: Peter [name]  FMW:[id number]  Pre-Operative Diagnosis: Right knee Osteoarthritis  Post-Operative Diagnosis: (same)  Procedure: Right Total Knee Arthroplasty  Components/Implants: Femur: Persona Size 11 CR PPS   Tibia: Persona Size G OsseoTi  Poly: 10mm MC  Patella: 35x9.57mm symmetric OsseoTi  Femoral Valgus Cut Angle: 5 degrees  Distal Femoral Re-cut: none  Patella Resurfacing: yes   Date of Surgery: 03/28/2024  Surgeon: Arthea Sheer MD  Assistant: Debby Amber PA (present and scrubbed throughout the case, critical for assistance with exposure, retraction, instrumentation, and closure)   Anesthesiologist: Dario  Anesthesia: Spinal   Tourniquet Time: 51 min  EBL: 50cc  IVF: 600cc  Complications: None   Brief history: The patient is a 63 year old male with a history of osteoarthritis of the right knee with pain limiting their range of motion and activities of daily living, which has failed multiple attempts at conservative therapy.  The risks and benefits of total knee arthroplasty as definitive surgical treatment were discussed with the patient, who opted to proceed with the operation.  After outpatient medical clearance and optimization was completed the patient was admitted to [facility] for the procedure.  All preoperative films were reviewed and an appropriate surgical plan was made prior to surgery. Preoperative range of motion was 0 to 105. The patient was identified as having a varus alignment.   Description of procedure: The patient was brought to the operating room where laterality was confirmed by all those present to be the right side.   Spinal anesthesia was administered and the patient received an intravenous dose of antibiotics for surgical prophylaxis and a dose of tranexamic acid .  Patient is positioned supine on the operating room table with all bony prominences well-padded.  A well-padded tourniquet was applied to  the right thigh.  The knee was then prepped and draped in usual sterile fashion with multiple layers of adhesive and nonadhesive drapes.  All of those present in the operating room participated in a surgical timeout laterality and patient were confirmed.   An Esmarch was wrapped around the extremity and the leg was elevated and the knee flexed.  The tourniquet was inflated to a pressure of 275 mmHg. The Esmarch was removed and the leg was brought down to full extension.  The patella and tibial tubercle identified and outlined using a marking pen and a midline skin incision was made with a knife carried through the subcutaneous tissue down to the extensor retinaculum.  After exposure of the extensor mechanism the medial parapatellar arthrotomy was performed with a scalpel and electrocautery extending down medial and distal to the tibial tubercle taking care to avoid incising the patellar tendon.   A standard medial release was performed over the proximal tibia.  The knee was brought into extension in order to excise the fat pad taking care not to damage the patella tendon.  The superior soft tissue was removed from the anterior surface of the distal femur to visualize for the procedure.  The knee was then brought into flexion with the patella subluxed laterally and subluxing the tibia anteriorly.  The ACL was transected and removed with electrocautery and additional soft tissue was removed from the proximal surface of the tibia to fully expose. The PCL was found to be intact and was preserved.  An extramedullary tibial cutting guide was then applied to the leg with a spring-loaded ankle clamp placed around the distal tibia just above the malleoli the angulation of the guide was adjusted to give  some posterior slope in the tibial resection with an appropriate varus/valgus alignment.  The resection guide was then pinned to the proximal tibia and the proximal tibial surface was resected with an oscillating saw.   Careful attention was paid to ensure the blade did not disrupt any of the soft tissues including any lateral or medial ligament.  Attention was then turned to the femur, with the knee slightly flexed a opening drill was used to enter the medullary canal of the femur.  After removing the drill marrow was suctioned out to decompress the distal femur.  An intramedullary femoral guide was then inserted into the drill hole and the alignment guide was seated firmly against the distal end of the medial femoral condyle.  The distal femoral cutting guide was then attached and pinned securely to the anterior surface of the femur and the intramedullary rod and alignment guide was removed.  Distal femur resection was then performed with an oscillating saw with retractors protecting medial and laterally.   The distal cutting block was then removed and the extension gap was checked with a spacer.  Extension gap was found to be appropriately sized to accommodate the spacer block.   The femoral sizing guide was then placed securely into the posterior condyles of the femur and the femoral size was measured and determined to be 11.  The size 11; 4-in-1 cutting guide was placed in position and secured with 2 pins.  The anterior posterior and chamfer resections were then performed with an oscillating saw.  Bony fragments and osteophytes were then removed.  Using a lamina spreader the posterior medial and lateral condyles were checked for additional osteophytes and posterior soft tissue remnants.  Any remaining meniscus was removed at this time.  Periarticular injection was performed in the meniscal rims and posterior capsule with aspiration performed to ensure no intravascular injection.   The tibia was then exposed and the tibial trial was pinned onto the plateau after confirming appropriate orientation and rotation.  Using the drill bushing the tibia was prepared to the appropriate drill depth.  Tibial broach impactor was  then driven through the punch guide using a mallet.  The femoral trial component was then inserted onto the femur. A trial tibial polyethylene bearing was then placed and the knee was reduced.  The knee achieved full extension with no hyperextension and was found to be balanced in flexion and extension with the trials in place.  The knee was then brought into full extension the patella was everted and held with 2 Kocher clamps.  The articular surface of the patella was then resected with an patella reamer and saw after careful measurement with a caliper.  The patella was then prepared with the drill guide and a trial patella was placed.  The knee was then taken through range of motion and it was found that the patella articulated appropriately with the trochlea and good patellofemoral motion without subluxation.    The correct final components for implantation were confirmed and opened by the circulator nurse.  The knee was irrigated with normal saline via pulsatile lavage to remove any bony debris or soft tissue.  The prepared surface of the tibia was exposed and the tibial component was implanted with good bony contact.  The femoral component was then placed and impacted showing good coverage and a good snug fit.  The patella was then cleared off and the patella compression tool was used to apply the patellar component with symmetric compression onto the patella.  The tibial component was then irrigated and cleared of any debris and a real polyethylene component was placed and engaged with the locking mechanism.  The knee was then injected with the particular cocktail.  The knee was taken through range of motion and found to be stable in flexion and extension with patellar tracking.  The knee was then irrigated with copious amount of normal saline via pulsatile lavage to remove all loose bodies and other debris.  The knee was then irrigated with surgiphor betadine  based wash and reirrigated with saline.  The  tourniquet was then dropped and all bleeding vessels were identified and coagulated.  The arthrotomy was approximated with #1 Vicryl and closed with #1 Stratafix suture.  The knee was brought into slight flexion and the subcutaneous tissues were closed with 0 Vicryl, 2-0 Vicryl and a running subcuticular 4-0 stratafix barbed suture.  Skin was then glued with Dermabond.  A sterile adhesive dressing was then placed along with a sequential compression device to the calf, a Ted stocking, and a cryotherapy cuff.   Sponge, needle, and Lap counts were all correct at the end of the case.   The patient was transferred off of the operating room table to a hospital bed, good pulses were found distally on the operative side.  The patient was transferred to the recovery room in stable condition.

## 2024-03-28 NOTE — Discharge Instructions (Signed)

## 2024-03-28 NOTE — Discharge Summary (Signed)
 Physician Discharge Summary  Patient ID: Peter [name] MRN: [id number] DOB/AGE: 65/10/1960 65 y.o.  Admit date: 03/28/2024 Discharge date: 03/29/2024  Admission Diagnoses:  Primary osteoarthritis of right knee [M17.11] S/P total knee arthroplasty, right [Z96.651]   Discharge Diagnoses: Patient Active Problem List   Diagnosis Date Noted   S/P total knee arthroplasty, right 03/28/2024   Pain of upper abdomen 10/01/2022   Gastric erosion 10/01/2022   Duodenal ulcer 10/01/2022   Polyp of duodenum 10/01/2022   Cecal polyp 10/01/2022   Syncope due to autonomic failure 09/26/2022   Syncope and collapse 09/26/2022   Nocturia 09/26/2022   Screening for diabetes mellitus 09/26/2022   Mild cerebral atrophy 09/24/2022   Frequent falls 09/24/2022   Hypokalemia 09/24/2022   Nausea and vomiting 09/07/2022   Episode of dizziness 09/07/2022   Hospital discharge follow-up 09/07/2022   Glaucoma (increased eye pressure) 08/18/2022   DNR (do not resuscitate) 08/18/2022   Other fatigue 05/07/2022   Healthcare maintenance 05/07/2022   SBO (small bowel obstruction) (HCC) 05/28/2020   Bipolar disorder with depression (HCC) 11/14/2019   Dysphagia    Colon cancer screening    Mild cognitive impairment 06/04/2019   History of arthroplasty of left shoulder 03/26/2019   History of total knee arthroplasty 03/21/2019   GERD (gastroesophageal reflux disease) 12/16/2018   Erectile dysfunction 09/14/2017   Open abdominal incision with drainage    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/11/2016   Necrotizing soft tissue infection 11/11/2016   Class 3 severe obesity due to excess calories with serious comorbidity and body mass index (BMI) of 45.0 to 49.9 in adult 09/21/2016   Decreased libido 09/21/2016   Shortness of breath 06/15/2016   Abnormal stress test 06/15/2016   Testicular hypofunction 03/25/2015   Avitaminosis D 03/25/2015   Clinical depression 06/21/2011   Cannot sleep 08/22/2009    Hypercholesterolemia without hypertriglyceridemia 01/28/2009   Adjustment disorder with mixed anxiety and depressed mood 12/12/2007   Essential (primary) hypertension 12/12/2007   Apnea, sleep 12/12/2007    Past Medical History:  Diagnosis Date   Bipolar disorder (HCC)    Blood in stool    Depression    Difficult intubation    a.) morbid obesity, known OSA, short and thick neck with reduced TMD, hypersensitive/exaggerated gag reflex   Duodenal ulcer    Dyspnea    Gastric ulcer 2011   GERD (gastroesophageal reflux disease)    Glaucoma 2004   Hemorrhoids    History of tracheostomy 11/26/2016   a.) in setting of sepsis; has been removed   Hypertension 2000   Long-term use of aspirin  therapy    Numbness of right lower extremity    Obesity, unspecified    OSA on CPAP    Primary osteoarthritis of right knee    Sepsis (HCC)      Transfusion: none   Consultants (if any):   Discharged Condition: Improved  Hospital Course: Peter [name] is an 63 y.o. male who was admitted 03/28/2024 with a diagnosis of S/P total knee arthroplasty, right and went to the operating room on 03/28/2024 and underwent the above named procedures.    Surgeries: Procedure(s): ARTHROPLASTY, KNEE, TOTAL on 03/28/2024 Patient tolerated the surgery well. Taken to PACU where she was stabilized and then transferred to the orthopedic floor.  Started on Lovenox  30 mg q 12 hrs. TEDs and SCDs applied bilaterally. Heels elevated on bed. No evidence of DVT. Negative Homan. Physical therapy started on day #1 for gait training and transfer. OT started day #1  for ADL and assisted devices.  Patient's IV was d/c on day #1. Patient was able to safely and independently complete all PT goals. PT recommending discharge to home.    On post op day #1 patient was stable and ready for discharge to home with HHPT.  Implants: Femur: Persona Size 11 CR PPS   Tibia: Persona Size G OsseoTi  Poly: 10mm MC  Patella: 35x9.86mm symmetric  OsseoTi   He was given perioperative antibiotics:  Anti-infectives (From admission, onward)    Start     Dose/Rate Route Frequency Ordered Stop   03/28/24 1400  ceFAZolin  (ANCEF ) IVPB 3g/150 mL premix        3 g 300 mL/hr over 30 Minutes Intravenous Every 6 hours 03/28/24 1113 03/29/24 0159   03/28/24 0600  ceFAZolin  (ANCEF ) IVPB 3g/150 mL premix        3 g 300 mL/hr over 30 Minutes Intravenous On call to O.R. 03/28/24 0129 03/28/24 0759     .  He was given sequential compression devices, early ambulation, and Lovenox  TEDs for DVT prophylaxis.  He benefited maximally from the hospital stay and there were no complications.    Recent vital signs:  Vitals:   03/28/24 1115 03/28/24 1136  BP: 117/68 131/71  Pulse: (!) 45 (!) 52  Resp: 16 18  Temp: (!) 97.3 F (36.3 C)   SpO2: 99% 95%    Recent laboratory studies:  Lab Results  Component Value Date   HGB 13.6 03/28/2024   HGB 14.3 03/21/2024   HGB 14.1 03/23/2023   Lab Results  Component Value Date   WBC 6.5 03/21/2024   PLT 221 03/21/2024   Lab Results  Component Value Date   INR 1.0 05/28/2020   Lab Results  Component Value Date   NA 141 03/28/2024   K 4.0 03/28/2024   CL 105 03/28/2024   CO2 28 03/21/2024   BUN 26 (H) 03/28/2024   CREATININE 1.00 03/28/2024   GLUCOSE 107 (H) 03/28/2024    Discharge Medications:   Allergies as of 03/28/2024       Reactions   Oxycodone  Other (See Comments)   Causes Hallucinations, auditory and tactile   Isosorbide  Nausea And Vomiting   Headache, nausea, vomiting     Med Rec must be completed prior to using this [facility]***        Durable Medical Equipment  (From admission, onward)           Start     Ordered   03/28/24 1243  For home use only DME 3 n 1  Once        03/28/24 1243   03/28/24 1243  For home use only DME Walker rolling  Once       Question Answer Comment  Walker: With 5 Inch Wheels   Patient needs a walker to treat with the following  condition Total knee replacement status      03/28/24 1243            Diagnostic Studies: DG Knee Right Port Result Date: 03/28/2024 EXAM: 2 VIEW(S) XRAY OF THE RIGHT KNEE 03/28/2024 10:37:00 AM COMPARISON: None available. CLINICAL HISTORY: S/P total knee arthroplasty, right [id number] FINDINGS: BONES AND JOINTS: Right knee arthroplasty is noted with expected postsurgical changes including gas and fluid in the joint. No acute fracture. No focal osseous lesion. No joint dislocation. SOFT TISSUES: The soft tissues are unremarkable. IMPRESSION: 1. Right knee arthroplasty with expected immediate postoperative changes, including intra-articular gas and fluid 2.  No prior studies available for comparison Electronically signed by: Lonni Necessary MD 03/28/2024 11:33 AM EDT RP Workstation: [id number]    Disposition:      Follow-up Information     Charlene Debby BROCKS, PA-C Follow up in 2 week(s).   Specialties: Orthopedic Surgery, Emergency Medicine Contact information: [address] [place] [address] [phone]                  Signed: Debby BROCKS Charlene 03/28/2024, 12:50 PM

## 2024-03-28 NOTE — TOC CM/SW Note (Addendum)
 Patient is not able to walk the distance required to go the bathroom, or he/she is unable to safely negotiate stairs required to access the bathroom.  A 3in1 BSC will alleviate this problem

## 2024-03-28 NOTE — Progress Notes (Incomplete)
 Post op

## 2024-03-29 ENCOUNTER — Encounter: Payer: Self-pay | Admitting: Orthopedic Surgery

## 2024-03-29 ENCOUNTER — Other Ambulatory Visit: Payer: Self-pay

## 2024-03-29 DIAGNOSIS — M1711 Unilateral primary osteoarthritis, right knee: Secondary | ICD-10-CM | POA: Diagnosis not present

## 2024-03-29 LAB — BASIC METABOLIC PANEL WITH GFR
Anion gap: 11 (ref 5–15)
BUN: 27 mg/dL — ABNORMAL HIGH (ref 8–23)
CO2: 26 mmol/L (ref 22–32)
Calcium: 8.7 mg/dL — ABNORMAL LOW (ref 8.9–10.3)
Chloride: 102 mmol/L (ref 98–111)
Creatinine, Ser: 0.97 mg/dL (ref 0.61–1.24)
GFR, Estimated: >60 mL/min (ref >60)
Glucose, Bld: 98 mg/dL (ref 70–99)
Potassium: 3.8 mmol/L (ref 3.5–5.1)
Sodium: 139 mmol/L (ref 135–145)

## 2024-03-29 LAB — CBC
HCT: 38.4 % — ABNORMAL LOW (ref 39.0–52.0)
Hemoglobin: 13.1 g/dL (ref 13.0–17.0)
MCH: 30.6 pg (ref 26.0–34.0)
MCHC: 34.1 g/dL (ref 30.0–36.0)
MCV: 89.7 fL (ref 80.0–100.0)
Platelets: 191 K/uL (ref 150–400)
RBC: 4.28 MIL/uL (ref 4.22–5.81)
RDW: 12.5 % (ref 11.5–15.5)
WBC: 9.7 K/uL (ref 4.0–10.5)
nRBC: 0 % (ref 0.0–0.2)

## 2024-03-29 MED ORDER — ONDANSETRON HCL 4 MG PO TABS
4.0000 mg | ORAL_TABLET | Freq: Four times a day (QID) | ORAL | 0 refills | Status: AC | PRN
  Filled 2024-03-29: qty 20, 5d supply, fill #0

## 2024-03-29 MED ORDER — DOCUSATE SODIUM 100 MG PO CAPS
100.0000 mg | ORAL_CAPSULE | Freq: Two times a day (BID) | ORAL | 0 refills | Status: AC
  Filled 2024-03-29: qty 10, 5d supply, fill #0

## 2024-03-29 MED ORDER — ACETAMINOPHEN 500 MG PO TABS
1000.0000 mg | ORAL_TABLET | Freq: Three times a day (TID) | ORAL | 0 refills | Status: AC
  Filled 2024-03-29: qty 30, 5d supply, fill #0

## 2024-03-29 MED ORDER — CELECOXIB 200 MG PO CAPS
200.0000 mg | ORAL_CAPSULE | Freq: Two times a day (BID) | ORAL | 0 refills | Status: AC
  Filled 2024-03-29: qty 28, 14d supply, fill #0

## 2024-03-29 MED ORDER — TRAMADOL HCL 50 MG PO TABS
50.0000 mg | ORAL_TABLET | Freq: Four times a day (QID) | ORAL | 0 refills | Status: AC | PRN
  Filled 2024-03-29: qty 30, 7d supply, fill #0

## 2024-03-29 MED ORDER — HYDROCODONE-ACETAMINOPHEN 5-325 MG PO TABS
1.0000 | ORAL_TABLET | Freq: Three times a day (TID) | ORAL | 0 refills | Status: AC | PRN
  Filled 2024-03-29: qty 21, 7d supply, fill #0

## 2024-03-29 MED ORDER — ENOXAPARIN SODIUM 40 MG/0.4ML IJ SOSY
40.0000 mg | PREFILLED_SYRINGE | INTRAMUSCULAR | 0 refills | Status: AC
  Filled 2024-03-29: qty 5.6, 14d supply, fill #0

## 2024-03-29 MED ORDER — AMLODIPINE BESYLATE 5 MG PO TABS
ORAL_TABLET | ORAL | Status: AC
  Filled 2024-03-29: qty 2

## 2024-03-29 MED ORDER — HYDRALAZINE HCL 50 MG PO TABS
ORAL_TABLET | ORAL | Status: AC
  Filled 2024-03-29: qty 2

## 2024-03-29 NOTE — Plan of Care (Signed)

## 2024-03-29 NOTE — Plan of Care (Signed)
  Problem: Skin Integrity: Goal: Will show signs of wound healing Outcome: Progressing   

## 2024-03-29 NOTE — Progress Notes (Signed)
 DISCHARGE NOTE:  Pt dc with IV removed and dc instructions given. Pt has both TED hose on and in place. Pt given polar care and incentive spirometer to take home. Pt received medications delivered to hospital room as well as 3 in 1 and RW. Pt voices no questions or concerns at this time. Pt wheeled down to medical mall entrance by staff and pt's wife provided transportation.

## 2024-03-29 NOTE — Progress Notes (Signed)
   Subjective: 1 Day Post-Op Procedure(s) (LRB): ARTHROPLASTY, KNEE, TOTAL (Right) Patient reports pain as mild.   Patient is well, and has had no acute complaints or problems Denies any CP, SOB, ABD pain. We will continue therapy today.  Plan is to go Home after hospital stay.  Objective: Vital signs in last 24 hours: Temp:  [96.8 F (36 C)-98.4 F (36.9 C)] 97.2 F (36.2 C) (09/25 0454) Pulse Rate:  [43-66] 66 (09/25 0454) Resp:  [12-18] 18 (09/25 0454) BP: (112-155)/(63-76) 155/76 (09/25 0454) SpO2:  [93 %-99 %] 98 % (09/25 0454)  Intake/Output from previous day: 09/24 0701 - 09/25 0700 In: 1956.7 [I.V.:1356.7; IV Piggyback:600] Out: 1350 [Urine:1300; Blood:50] Intake/Output this shift: No intake/output data recorded.  Recent Labs    03/28/24 0641 03/29/24 0627  HGB 13.6 13.1   Recent Labs    03/28/24 0641 03/29/24 0627  WBC  --  9.7  RBC  --  4.28  HCT 40.0 38.4*  PLT  --  191   Recent Labs    03/28/24 0641 03/29/24 0627  NA 141 139  K 4.0 3.8  CL 105 102  CO2  --  26  BUN 26* 27*  CREATININE 1.00 0.97  GLUCOSE 107* 98  CALCIUM   --  8.7*   No results for input(s): LABPT, INR in the last 72 hours.  EXAM General - Patient is Alert, Appropriate, and Oriented Extremity - Neurovascular intact Sensation intact distally Intact pulses distally Dorsiflexion/Plantar flexion intact Dressing - dressing C/D/I and no drainage Motor Function - intact, moving foot and toes well on exam.   Past Medical History:  Diagnosis Date   Bipolar disorder (HCC)    Blood in stool    Depression    Difficult intubation    a.) morbid obesity, known OSA, short and thick neck with reduced TMD, hypersensitive/exaggerated gag reflex   Duodenal ulcer    Dyspnea    Gastric ulcer 2011   GERD (gastroesophageal reflux disease)    Glaucoma 2004   Hemorrhoids    History of tracheostomy 11/26/2016   a.) in setting of sepsis; has been removed   Hypertension 2000    Long-term use of aspirin  therapy    Numbness of right lower extremity    Obesity, unspecified    OSA on CPAP    Primary osteoarthritis of right knee    Sepsis (HCC)     Assessment/Plan:   1 Day Post-Op Procedure(s) (LRB): ARTHROPLASTY, KNEE, TOTAL (Right) Principal Problem:   S/P total knee arthroplasty, right  Estimated body mass index is 42.7 kg/m as calculated from the following:   Height as of this encounter: 5' 7 (1.702 m).   Weight as of this encounter: 123.7 kg. Advance diet Up with therapy Pain well controlled Labs and VSS CM to assist with discharge to home with HHPT today  DVT Prophylaxis - Lovenox , TED hose, and SCDs Weight-Bearing as tolerated to right leg   T. Medford Amber, PA-C [facility] 03/29/2024, 7:26 AM

## 2024-03-29 NOTE — Anesthesia Postprocedure Evaluation (Signed)
 Anesthesia Post Note  Patient: Peter [name]  Procedure(s) Performed: ARTHROPLASTY, KNEE, TOTAL (Right: Knee)  Patient location during evaluation: Nursing Unit Anesthesia Type: Spinal Level of consciousness: awake Pain management: pain level controlled Respiratory status: spontaneous breathing Cardiovascular status: stable Postop Assessment: no headache Anesthetic complications: no   There were no known notable events for this encounter.   Last Vitals:  Vitals:   03/28/24 2320 03/29/24 0454  BP: 132/71 (!) 155/76  Pulse: 63 66  Resp: 18 18  Temp: (!) 36 C (!) 36.2 C  SpO2: 97% 98%    Last Pain:  Vitals:   03/29/24 0454  TempSrc: Temporal  PainSc:                  Shona Earnie Fare

## 2024-03-29 NOTE — Progress Notes (Signed)
 Physical Therapy Treatment Patient Details Name: Peter [name] MRN: [id number] DOB: 03-13-1961 Today's Date: 03/29/2024   History of Present Illness Patient is a 63 year old male with right knee osteoarthritis s/p right TKA. PMH: sleep apnea, depression, bipolar disorder, HTN, obesity, dyspnea    PT Comments  Patient is progressing well with increased independence with activity and increased activity tolerance. Stair training completed. Pain seems well controlled. Mobility is adequate for discharge home with family support. Recommend PT follow up after this hospital stay.    If plan is discharge home, recommend the following: Assist for transportation;Help with stairs or ramp for entrance   Can travel by private vehicle        Equipment Recommendations  Rolling walker (2 wheels);BSC/3in1    Recommendations for Other Services       Precautions / Restrictions Precautions Precautions: Knee;Fall Precaution Booklet Issued: Yes (comment) Recall of Precautions/Restrictions: Intact Restrictions Weight Bearing Restrictions Per Provider Order: Yes RLE Weight Bearing Per Provider Order: Weight bearing as tolerated     Mobility  Bed Mobility               General bed mobility comments: not assessed, sitting up on arrival and post session    Transfers Overall transfer level: Needs assistance Equipment used: Rolling walker (2 wheels) Transfers: Sit to/from Stand Sit to Stand: Supervision           General transfer comment: good safety awareness with RLE positioning and hand placement    Ambulation/Gait Ambulation/Gait assistance: Supervision Gait Distance (Feet): 175 Feet Assistive device: Rolling walker (2 wheels) Gait Pattern/deviations: Step-through pattern Gait velocity: decreased     General Gait Details: reinforced heel-toe gait pattern and appropriate use of rolling walker. patient demonstrated without difficulty   Stairs Stairs: Yes Stairs assistance:  Supervision Stair Management: Two rails, Step to pattern, Forwards Number of Stairs: 4 General stair comments: verbal cues for sequencing of rolling walker and BLE. supervision for safety   Wheelchair Mobility     Tilt Bed    Modified Rankin (Stroke Patients Only)       Balance Overall balance assessment: Needs assistance Sitting-balance support: Feet supported Sitting balance-Leahy Scale: Good     Standing balance support: Bilateral upper extremity supported, Single extremity supported Standing balance-Leahy Scale: Fair Standing balance comment: using rolling walker for support                            Communication Communication Communication: No apparent difficulties  Cognition Arousal: Alert Behavior During Therapy: WFL for tasks assessed/performed   PT - Cognitive impairments: No apparent impairments                         Following commands: Intact      Cueing Cueing Techniques: Verbal cues  Exercises Total Joint Exercises Goniometric ROM: right knee flexion 87 degrees    General Comments        Pertinent Vitals/Pain Pain Assessment Pain Assessment: Faces Faces Pain Scale: Hurts a little bit Pain Location: right knee with movement Pain Descriptors / Indicators: Discomfort Pain Intervention(s): Monitored during session, Repositioned (polar care re-applied and encouraged)    Home Living                          Prior Function            PT Goals (current goals can now  be found in the care plan section) Acute Rehab PT Goals Patient Stated Goal: to go home PT Goal Formulation: With patient Time For Goal Achievement: 04/11/24 Potential to Achieve Goals: Good Progress towards PT goals: Progressing toward goals    Frequency    BID      PT Plan      Co-evaluation              AM-PAC PT 6 Clicks Mobility   Outcome Measure  Help needed turning from your back to your side while in a flat bed  without using bedrails?: None Help needed moving from lying on your back to sitting on the side of a flat bed without using bedrails?: A Little Help needed moving to and from a bed to a chair (including a wheelchair)?: A Little Help needed standing up from a chair using your arms (e.g., wheelchair or bedside chair)?: A Little Help needed to walk in hospital room?: A Little Help needed climbing 3-5 steps with a railing? : A Little 6 Click Score: 19    End of Session   Activity Tolerance: Patient tolerated treatment well Patient left: in chair;with call bell/phone within reach;with family/visitor present;with SCD's reapplied (SCD LLE, polar care RLE) Nurse Communication: Mobility status PT Visit Diagnosis: Difficulty in walking, not elsewhere classified (R26.2);Other abnormalities of gait and mobility (R26.89)     Time: 9180-9156 PT Time Calculation (min) (ACUTE ONLY): 24 min  Charges:    $Gait Training: 23-37 mins PT General Charges $$ ACUTE PT VISIT: 1 Visit                     Peter [name], PT, MPT    Peter [name] 03/29/2024, 8:51 AM

## 2024-03-29 NOTE — Progress Notes (Signed)
  Chaplain On-Call responded to Rapid Response notification at 0910 hours.  Upon arrival, Chaplain learned that the RR has been cleared and the patient is in stable condition and is resting.  Chaplain assured Staff of availability as needed.  Chaplain Bebe Ardean EMERSON Hershal., [facility]

## 2024-03-29 NOTE — Progress Notes (Signed)
 Pt sitting in the chair became nauseas, lethargic, and clammy. Rapid response team called and at bedside. Pt recovered quickly and vitals returned to baseline. MD Lorelle and PA Charlene notified. Pt siting upright in recliner and voices no concerns.

## 2024-04-13 NOTE — Progress Notes (Signed)
 [facility]  Chief complaint: Hypotension   Subjective: Peter [name] is a 63 y.o. male here for hypotension at home. Weakness. S/p RTKD  about 3 weeks ago. History of Present Illness Peter [name] is a 63 year old male who presents with low blood pressure following recent surgery.  He underwent surgery on March 28, 2024, and is experiencing significant postoperative pain. The pain is severe enough to prevent him from performing his exercises. He is taking hydrocodone , two tablets, but notes that the pain returns after four hours, indicating that the medication's effect wears off quickly. He describes the pain as intense, stating that 'at three hours and fifty minutes you can tell that you need the next pill.'  He reports a significant drop in blood pressure following his medication intake, with the lowest recorded at 82/48 mmHg. This hypotensive episode occurred after taking his regular medications, and he has experienced similar episodes since his discharge from the hospital, where he also passed out after receiving tramadol . His wife notes that he passed out again at home after taking his medications.  He has a history of a bad reaction to oxycodone , which included hallucinations during a previous hospitalization in 2018. Despite this, he is considering discussing alternative pain management options due to the current inadequacy of his pain control.  No chest pain, shortness of breath, or swelling in his calves. He has not had any blood clots in the past. He is currently on a regimen of two baby aspirin  daily for three weeks following the cessation of injections, as part of his postoperative care.  His wife mentions that he has bipolar disorder and that there were issues with medication management during a previous hospitalization, which may have contributed to his adverse reaction to oxycodone .    Current Outpatient Medications:  .  ALPRAZolam  (XANAX ) 0.5 MG tablet, Take  1 tablet by mouth 2 (two) times daily as needed, Disp: , Rfl:  .  amLODIPine  (NORVASC ) 10 MG tablet, Take 1 tablet (10 mg total) by mouth once daily, Disp: 90 tablet, Rfl: 3 .  aspirin  81 MG EC tablet, Take 81 mg by mouth once daily, Disp: , Rfl:  .  atorvastatin  (LIPITOR) 40 MG tablet, Take 1 tablet (40 mg total) by mouth once daily, Disp: 90 tablet, Rfl: 3 .  azelastine  (ASTELIN ) 137 mcg nasal spray, USE 1-2 SPRAYS IN EACH NOSTRIL TWICE A DAY FOR 30 DAYS, Disp: , Rfl:  .  carBAMazepine  (TEGRETOL  XR) 400 MG XR tablet, Take 1,000 mg by mouth once daily, Disp: , Rfl:  .  chlorthalidone  25 MG tablet, Take 1 tablet (25 mg total) by mouth once daily, Disp: 90 tablet, Rfl: 3 .  cholecalciferol (VITAMIN D3) 1000 unit tablet, Take 1 tablet (1,000 Units total) by mouth once daily, Disp: 30 tablet, Rfl: 3 .  cloNIDine  HCL (CATAPRES ) 0.2 MG tablet, Take 1 tablet (0.2 mg total) by mouth 2 (two) times daily, Disp: 180 tablet, Rfl: 3 .  cyanocobalamin  (VITAMIN B12) 1000 MCG tablet, Take 1,000 mcg by mouth once daily, Disp: , Rfl:  .  fluticasone  propionate (FLONASE ) 50 mcg/actuation nasal spray, Place into one nostril, Disp: , Rfl:  .  hydrALAZINE  (APRESOLINE ) 100 MG tablet, Take 1 tablet (100 mg total) by mouth 3 (three) times daily, Disp: 270 tablet, Rfl: 3 .  HYDROcodone -acetaminophen  (NORCO) 5-325 mg tablet, Take 1 tablet by mouth every 6 (six) hours as needed for Pain, Disp: 30 tablet, Rfl: 0 .  lamoTRIgine  (LAMICTAL ) 200 MG  tablet, Take 1 tablet (200 mg total) by mouth 2 (two) times daily, Disp: 180 tablet, Rfl: 3 .  latanoprost  (XALATAN ) 0.005 % ophthalmic solution, Apply to eye., Disp: , Rfl:  .  levocetirizine (XYZAL) 5 MG tablet, Take 5 mg by mouth every evening, Disp: , Rfl:  .  losartan  (COZAAR ) 100 MG tablet, Take 1 tablet (100 mg total) by mouth once daily, Disp: 90 tablet, Rfl: 3 .  metoclopramide  (REGLAN ) 10 MG tablet, Take 1 tablet (10 mg total) by mouth 4 (four) times daily, Disp: 40 tablet,  Rfl: 0 .  metoprolol  TARTrate (LOPRESSOR ) 100 MG tablet, Take 0.5 tablets (50 mg total) by mouth 2 (two) times daily, Disp: 180 tablet, Rfl: 3 .  montelukast  (SINGULAIR ) 10 mg tablet, Take 10 mg by mouth once daily, Disp: , Rfl:  .  omega-3 acid ethyl esters (LOVAZA ) 1 gram capsule, Take 1,000 mg by mouth 3 (three) times a day, Disp: , Rfl:  .  potassium gluconate 2.5 mEq Tab, Take by mouth, Disp: , Rfl:  .  UNABLE TO FIND, KYOLIC- 300 MG 1 TABLET THREE TIMES DAILY, Disp: , Rfl:  .  predniSONE (DELTASONE) 20 MG tablet, Take 1 tablet (20 mg total) by mouth once daily (Patient not taking: Reported on 01/25/2024), Disp: 7 tablet, Rfl: 0 .  traMADoL  (ULTRAM ) 50 mg tablet, Take 1-2 tablets (50-100 mg total) by mouth every 6 (six) hours as needed, Disp: 30 tablet, Rfl: 0 .  UNABLE TO FIND, Med Name: kyolic 300 mg three times a day (Patient not taking: Reported on 04/13/2024), Disp: , Rfl:   ROS reviewed: General ROS:  Negative for  - fatigue, fevers, chills HEENT ROS: negative for seasonal allergies, congestion, cough Respiratory ROS: negative for - cough, SOB, wheezing Cardiovascular ROS: no chest pain or dyspnea on exertion Gastrointestinal ROS: negative for constipation, N/V,D Genito-Urinary ROS: no dysuria, trouble voiding, or hematuria Musculoskeletal ROS: negative for joint pain, swelling Neurological ROS: negative for headaches, dizzinesss Dermatological ROS: negative for rash or skin lesion    Psychological ROS: negative for depression or anxiety  Allergies  Allergen Reactions  . Oxycodone  Hallucination    Causes Hallucinations, auditory and tactile  . Isosorbide  Nausea    Headache, nausea, vomiting    Social History   Tobacco Use  . Smoking status: Never  . Smokeless tobacco: Never  Vaping Use  . Vaping status: Never Used  Substance Use Topics  . Alcohol  use: Not Currently    Comment: 1 or 2 Beers A Year  . Drug use: No      Objective:  BP 110/74 (BP Location: Left upper  arm, Patient Position: Sitting, BP Cuff Size: Large Adult)   Pulse 65   Resp 16   Ht 168.9 cm (5' 6.5)   Wt (!) 127.9 kg (282 lb)   SpO2 95%   BMI 44.83 kg/m  reviewed. Gen: AAOx3. Well-developed and well-nourished. NAD.  HEENT:    HEAD NORMOCEPHALIC.  PERRLA, EOM intact.  No thyromegaly present. No lymphadpathy. Cardiovascular: Normal rate, regular rhythm. Normal S1 and S2 without murmus, rubs or gallops.  Pulmonary/Chest: CTAB. Effort normal and breath sounds normal. No respiratory distress. No wheezes or rales. Abdomen: Soft. Bowel sounds are normal. No distension or tenderness.  Neuro: Cranial nervess II-XII intact.Nonfocal exam. Skin: Skin is warm and dry.  Musculoskeletal: Normal range of motion. No edema, no tenderness.  Psychiatric: Normal mood and affect. Her behavior is normal. Judgment and thought content normal ECG--sinus bradycardia at 58bpm with  no isvhemic cnages. Assessment and Plan: Assessment & Plan Post-operative pain following right knee replacement surgery Severe pain post-surgery affecting sleep and exercise. Current hydrocodone  regimen is insufficient. - Discuss pain management options with orthopedic team, including potential use of oxycodone  or fentanyl  patches. - Continue using ice therapy. - Monitor pain levels and adjust medications as needed.  Primary osteoarthritis of right knee, post-operative Post-operative status with significant pain impacting rehabilitation. - Continue physical therapy to improve range of motion and manage scar tissue. - Discuss pain management options with orthopedic team.  Hypotension secondary to antihypertensive therapy and pain medications Hypotension likely secondary to clonidine  and pain medications. No symptoms like chest pain or shortness of breath. Differential includes potential blood clot post-surgery. - Discontinue clonidine  temporarily. - Order cardiogram to evaluate heart function. - Order blood work, including  D-dimer, to rule out blood clot. - Monitor blood pressure and symptoms closely. - Consider emergency room visit if D-dimer is significantly elevated.  Essential hypertension Hypertension management complicated by recent hypotension episodes. Plan to reassess regimen post-hypotension resolution. - Reassess antihypertensive regimen after resolving hypotension. - Focus on weight loss as a long-term strategy.  Class 3 severe obesity (BMI 45.0-49.9) with comorbidity Severe obesity contributing to comorbid conditions such as hypertension. - Focus on weight loss strategies post-recovery from surgery. - Discuss dietary modifications and exercise plans once pain is managed.   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

## 2024-04-16 ENCOUNTER — Ambulatory Visit
Admission: RE | Admit: 2024-04-16 | Discharge: 2024-04-16 | Disposition: A | Discharge status: Home / Self Care | Source: Ambulatory Visit | Attending: Family Medicine | Admitting: Family Medicine

## 2024-04-16 ENCOUNTER — Other Ambulatory Visit: Payer: Self-pay | Admitting: Family Medicine

## 2024-04-16 ENCOUNTER — Encounter: Payer: Self-pay | Admitting: Family Medicine

## 2024-04-16 ENCOUNTER — Telehealth: Payer: Self-pay | Admitting: Physician Assistant

## 2024-04-16 ENCOUNTER — Ambulatory Visit
Admission: RE | Admit: 2024-04-16 | Discharge: 2024-04-16 | Disposition: A | Source: Ambulatory Visit | Attending: Family Medicine | Admitting: Family Medicine

## 2024-04-16 DIAGNOSIS — R0989 Other specified symptoms and signs involving the circulatory and respiratory systems: Secondary | ICD-10-CM

## 2024-04-16 DIAGNOSIS — R7989 Other specified abnormal findings of blood chemistry: Secondary | ICD-10-CM | POA: Insufficient documentation

## 2024-04-16 MED ORDER — IOHEXOL 350 MG/ML SOLN
75.0000 mL | Freq: Once | INTRAVENOUS | Status: AC | PRN
  Administered 2024-04-16: 75 mL via INTRAVENOUS

## 2024-04-16 NOTE — Telephone Encounter (Signed)
 Pt is having problems with Carbamazepine . Seems to think it tanks his BP and he is almost to the point of passing out. Wants to know if there is something else she can prescribe.

## 2024-04-16 NOTE — Telephone Encounter (Signed)
 Pt reporting that his BP is tanking to the point that he feels like he is going to pass out. He is relating this to Tegretol . He has been on this for several years without complaints. He recently had ortho surgery.  BP 10/10 110/74, pulse 65 - seen by PCP and diagnosed with drug induced hypotention.  BP 10/7 138/84  BP 9/25 140/81 130/60 102/52 107/52  BP 9/24 132/71 147/76 142/71  8/26 BP 126/88  Had surgery 9/24, discharged 9/25.

## 2024-04-17 NOTE — Telephone Encounter (Signed)
 I reviewed Dr. Chaya note and he mentions the clonidine  (he held) and possibly pain med as possible cause of hypotension.  He's been on the Tegretol  for years without hypotension so I doubt it's the cause, but we could decrease the dose to 400 mg bid to see if that helps. Continue to f/u w/ Dr. Bertrum concerning this.

## 2024-04-17 NOTE — Telephone Encounter (Signed)
 Reviewed recommendations with patient.

## 2024-07-24 ENCOUNTER — Other Ambulatory Visit (HOSPITAL_COMMUNITY): Payer: Self-pay

## 2024-09-17 ENCOUNTER — Ambulatory Visit: Admitting: Physician Assistant
